# Patient Record
Sex: Male | Born: 1945 | Race: White | Hispanic: No | Marital: Married | Smoking: Current some day smoker
Health system: Southern US, Community
[De-identification: ages and names within clinical notes are randomized; demographics above are authoritative.]

## PROBLEM LIST (undated history)

## (undated) ENCOUNTER — Emergency Department (HOSPITAL_COMMUNITY): Discharge status: Absent without leave | Payer: Medicare Other

## (undated) DIAGNOSIS — K219 Gastro-esophageal reflux disease without esophagitis: Secondary | ICD-10-CM

## (undated) DIAGNOSIS — I10 Essential (primary) hypertension: Secondary | ICD-10-CM

## (undated) DIAGNOSIS — I2510 Atherosclerotic heart disease of native coronary artery without angina pectoris: Secondary | ICD-10-CM

## (undated) DIAGNOSIS — N200 Calculus of kidney: Secondary | ICD-10-CM

## (undated) DIAGNOSIS — J449 Chronic obstructive pulmonary disease, unspecified: Secondary | ICD-10-CM

## (undated) DIAGNOSIS — I2699 Other pulmonary embolism without acute cor pulmonale: Secondary | ICD-10-CM

## (undated) DIAGNOSIS — R339 Retention of urine, unspecified: Secondary | ICD-10-CM

## (undated) DIAGNOSIS — G2581 Restless legs syndrome: Secondary | ICD-10-CM

## (undated) DIAGNOSIS — I255 Ischemic cardiomyopathy: Secondary | ICD-10-CM

## (undated) DIAGNOSIS — F17200 Nicotine dependence, unspecified, uncomplicated: Secondary | ICD-10-CM

## (undated) DIAGNOSIS — Z789 Other specified health status: Secondary | ICD-10-CM

## (undated) DIAGNOSIS — I4891 Unspecified atrial fibrillation: Secondary | ICD-10-CM

## (undated) DIAGNOSIS — R3129 Other microscopic hematuria: Secondary | ICD-10-CM

## (undated) DIAGNOSIS — K5792 Diverticulitis of intestine, part unspecified, without perforation or abscess without bleeding: Secondary | ICD-10-CM

## (undated) DIAGNOSIS — E785 Hyperlipidemia, unspecified: Secondary | ICD-10-CM

## (undated) DIAGNOSIS — F419 Anxiety disorder, unspecified: Secondary | ICD-10-CM

## (undated) DIAGNOSIS — I214 Non-ST elevation (NSTEMI) myocardial infarction: Secondary | ICD-10-CM

## (undated) DIAGNOSIS — C61 Malignant neoplasm of prostate: Secondary | ICD-10-CM

## (undated) DIAGNOSIS — Z9229 Personal history of other drug therapy: Secondary | ICD-10-CM

## (undated) DIAGNOSIS — Z8719 Personal history of other diseases of the digestive system: Secondary | ICD-10-CM

## (undated) HISTORY — DX: Retention of urine, unspecified: R33.9

## (undated) HISTORY — DX: Nicotine dependence, unspecified, uncomplicated: F17.200

## (undated) HISTORY — DX: Diverticulitis of intestine, part unspecified, without perforation or abscess without bleeding: K57.92

## (undated) HISTORY — DX: Other specified health status: Z78.9

## (undated) HISTORY — DX: Non-ST elevation (NSTEMI) myocardial infarction: I21.4

## (undated) HISTORY — DX: Atherosclerotic heart disease of native coronary artery without angina pectoris: I25.10

## (undated) HISTORY — PX: COLONOSCOPY: SHX174

## (undated) HISTORY — DX: Gastro-esophageal reflux disease without esophagitis: K21.9

## (undated) HISTORY — DX: Personal history of other drug therapy: Z92.29

## (undated) HISTORY — DX: Other microscopic hematuria: R31.29

## (undated) HISTORY — DX: Chronic obstructive pulmonary disease, unspecified: J44.9

## (undated) HISTORY — PX: BIV ICD INSERTION CRT-D: EP1195

## (undated) HISTORY — DX: Calculus of kidney: N20.0

## (undated) HISTORY — DX: Unspecified atrial fibrillation: I48.91

## (undated) HISTORY — PX: TRANSTHORACIC ECHOCARDIOGRAM: SHX275

## (undated) HISTORY — PX: INGUINAL HERNIA REPAIR: SUR1180

## (undated) HISTORY — DX: Restless legs syndrome: G25.81

## (undated) HISTORY — DX: Malignant neoplasm of prostate: C61

## (undated) HISTORY — DX: Ischemic cardiomyopathy: I25.5

## (undated) HISTORY — DX: Personal history of other diseases of the digestive system: Z87.19

## (undated) HISTORY — DX: Hyperlipidemia, unspecified: E78.5

## (undated) HISTORY — DX: Other pulmonary embolism without acute cor pulmonale: I26.99

---

## 2002-01-31 HISTORY — PX: LITHOTRIPSY: SUR834

## 2003-05-14 ENCOUNTER — Ambulatory Visit (HOSPITAL_COMMUNITY): Admission: RE | Admit: 2003-05-14 | Discharge: 2003-05-14 | Payer: Self-pay | Admitting: Urology

## 2003-05-16 ENCOUNTER — Ambulatory Visit (HOSPITAL_COMMUNITY): Admission: RE | Admit: 2003-05-16 | Discharge: 2003-05-16 | Payer: Self-pay | Admitting: Urology

## 2006-03-08 ENCOUNTER — Ambulatory Visit: Payer: Self-pay | Admitting: Cardiology

## 2006-03-22 ENCOUNTER — Ambulatory Visit: Payer: Self-pay

## 2006-03-22 ENCOUNTER — Encounter: Payer: Self-pay | Admitting: Cardiology

## 2008-11-29 ENCOUNTER — Emergency Department (HOSPITAL_BASED_OUTPATIENT_CLINIC_OR_DEPARTMENT_OTHER): Admission: EM | Admit: 2008-11-29 | Discharge: 2008-11-29 | Payer: Self-pay | Admitting: Emergency Medicine

## 2008-11-29 ENCOUNTER — Ambulatory Visit: Payer: Self-pay | Admitting: Interventional Radiology

## 2009-02-02 LAB — HM COLONOSCOPY: HM COLON: NORMAL

## 2010-05-06 LAB — CBC
MCHC: 34 g/dL (ref 30.0–36.0)
MCV: 93.7 fL (ref 78.0–100.0)
Platelets: 192 10*3/uL (ref 150–400)
RBC: 5.04 MIL/uL (ref 4.22–5.81)
RDW: 13 % (ref 11.5–15.5)
WBC: 10.5 10*3/uL (ref 4.0–10.5)

## 2010-05-06 LAB — DIFFERENTIAL
Eosinophils Absolute: 0.1 10*3/uL (ref 0.0–0.7)
Lymphocytes Relative: 14 % (ref 12–46)
Lymphs Abs: 1.4 10*3/uL (ref 0.7–4.0)
Neutro Abs: 8.2 10*3/uL — ABNORMAL HIGH (ref 1.7–7.7)
Neutrophils Relative %: 78 % — ABNORMAL HIGH (ref 43–77)

## 2010-05-06 LAB — URINALYSIS, ROUTINE W REFLEX MICROSCOPIC
Bilirubin Urine: NEGATIVE
Glucose, UA: NEGATIVE mg/dL
Hgb urine dipstick: NEGATIVE
Nitrite: NEGATIVE
Protein, ur: NEGATIVE mg/dL
Specific Gravity, Urine: 1.02 (ref 1.005–1.030)
Urobilinogen, UA: 1 mg/dL (ref 0.0–1.0)
pH: 6.5 (ref 5.0–8.0)

## 2010-05-06 LAB — BASIC METABOLIC PANEL
BUN: 13 mg/dL (ref 6–23)
Chloride: 103 mEq/L (ref 96–112)
GFR calc Af Amer: >60 mL/min (ref >60)
Sodium: 140 mEq/L (ref 135–145)

## 2010-06-18 NOTE — H&P (Signed)
NAME:  Daniel [name], Daniel [name]                            ACCOUNT NO.:  [id number]   MEDICAL RECORD NO.:  [id number]                   PATIENT TYPE:  AMB   LOCATION:  DAY                                  FACILITY:  [facility]   PHYSICIAN:  Dennie Maizes, M.D.                DATE OF BIRTH:  07-26-1945   DATE OF ADMISSION:  05/14/2003  DATE OF DISCHARGE:                                HISTORY & PHYSICAL   CHIEF COMPLAINT:  Right lower quadrant abdominal pain, nausea and vomiting.   HISTORY OF PRESENT ILLNESS:  This 65 year old male was referred to me from  the ER at [facility].  He experienced the sudden onset of  severe right lower quadrant abdominal pain radiating to the groin on May 12, 2003.  This was associated with nausea and vomiting.  He went to the  emergency room at [facility].  He was evaluated with a  noncontrast CT of the abdomen and pelvis.  This revealed a large right renal  calculus measuring about 15 mm in size.  There was mild to moderate right  hydronephrosis and dilated ureter.  No definite ureteral calculus was noted.  He was treated with pain medications.  The patient subsequently has passed a  small stone in the urine.  He has relief of right flank pain at the present.  He denied having any fever, chills or voiding difficulty.  There is no past  history of urolithiasis or urinary tract infections.   PAST MEDICAL HISTORY:  1. History of hypertension.  2. Status post bilateral inguinal hernia repair.   MEDICATION:  Hyzaar 25/100 one p.o. daily.   ALLERGIES:  None.   FAMILY HISTORY:  Positive for hypertension and diabetes mellitus.   PHYSICAL EXAMINATION:  HEENT:  Normal.  LUNGS:  Clear to auscultation.  HEART:  Regular rate and rhythm, no murmurs.  ABDOMEN:  Soft, no palpable frank mass.  No costovertebral angle tenderness.  BLADDER:  Not palpable.  PENIS AND TESTES:  Are normal.  RECTAL EXAMINATION:  Benign prostate, 30 gm size.   IMPRESSION:  1. Right renal calculus.  2. Hematuria.  3. Right flank pain.   PLAN:  1. Extracorporeal shock wave lithotripsy of the right renal calculus with IV     sedation in day hospital.  I will discuss with the patient regarding the     diagnosis, operative details, alternate treatments, outcome, possible     risk and complications and he has agreed for the procedure to be done.     __________     ___________________________________________                                         Dennie Maizes, M.D.   SK/MEDQ  D:  05/13/2003  T:  05/13/2003  Job:  [id number]

## 2010-06-18 NOTE — Assessment & Plan Note (Signed)
[facility]                            CARDIOLOGY OFFICE NOTE   TERRACE, FONTANILLA                         MRN:          [id number]  DATE:03/08/2006                            DOB:          20-May-1945    PRIMARY CARE PHYSICIAN:  Dr. Morrie Sheldon.   REASON FOR PRESENTATION:  Evaluate patient with palpitations.   HISTORY OF PRESENT ILLNESS:  Patient is a pleasant 65 year old white  gentleman with no prior cardiac history. He says he has had palpitations  on and off for 20 years. He notices them mostly when he has emotional  stress. He notices them more at night. He does not have them all the  time. However, he presented to Dr. Morrie Sheldon and was noted to have frequent  ventricular ectopy. He had bigeminy on an EKG. He wore an event monitor.  He wore this only for about a week and transmitted only once. He finally  took the patches off because it bothered him. He was not having any  further symptoms. He never has any presyncope or syncope with this. He  describes it as a skipped beat. It may be every other beat for a while.  He does not have any chest discomfort, neck or arm discomfort. He has no  shortness of breath, denies any PND or orthopnea. He works 12 hours a  day but does not exercise.   PAST MEDICAL HISTORY:  Hyperlipidemia recently diagnosed, hypertension  x3 to 4 years (he came off of medicine previously but was restarted a  few days ago).   PAST SURGICAL HISTORY:  Hernia repair x2.   ALLERGIES:  None.   MEDICATIONS:  1. Lovastatin 20 mg daily.  2. Amlodipine/benazepril 5/10 daily.   SOCIAL HISTORY:  Patient is a Naval architect. He is married. He has 2  children. He has been smoking a pack per day for 20 years.   FAMILY HISTORY:  Remarkable for his sister dying after bypass surgery at  age 6.   REVIEW OF SYSTEMS:  As stated in the HPI and positive for reflux.  Negative for all other systems.   PHYSICAL EXAMINATION:  The patient is in no  distress. Blood pressure  170/100, heart rate 80 and regular, weight 190 pounds, body mass index  26.  HEENT: Eyelids unremarkable, pupils equal, round, and react to light,  fundi not visualized, oral mucosa unremarkable.  NECK: No jugular venous distension, wave form within normal limits,  carotid upstroke brisk and symmetric, no bruits, no thyromegaly.  LYMPHATICS:  No cervical, axillary, or inguinal adenopathy.  LUNGS: Clear to auscultation bilaterally.  BACK: No costovertebral angle tenderness.  CHEST: Unremarkable.  HEART: PMI not displaced or sustained, S1 and S2 within normal limits,  no S3, no S4, no clicks, no rubs, no murmurs.  ABDOMEN: Flat, positive bowel sounds, normal in frequency and pitch, no  bruits, rebound, guarding, no midline pulsatile mass, no hepatomegaly,  no splenomegaly.  SKIN: No rashes, no nodules.  EXTREMITIES: 2+ pulses throughout, no edema, no cyanosis, no clubbing.  NEURO: Oriented to person, place, and time, cranial nerves  II-XII  grossly intact, motor grossly intact.   EKG:  Sinus rhythm with ventricular ectopy in a bigeminal pattern,  normal axis, intervals within normal limits, no acute ST-T wave changes.   ASSESSMENT AND PLAN:  1. Palpitations.  The patient has palpitations as described. These are      not particularly symptomatic. I do not strongly suspect structural      heart disease. I am going to get an echocardiogram. We will contact      [facility] to see if he has had any lab work to include a      TSH and a chemistry. Since he is not particularly bothered by this,      he is going to try to avoid caffeine which sometimes brings this      on. At this point I would not suggest primary therapy for this.  2. Multiple cardiovascular risk factors. I am going to bring the      patient back for an exercise treadmill test as he has strong      cardiovascular risk factors.  3. Dyslipidemia, per Dr. Morrie Sheldon.  4. Hypertension.  Blood pressure  is elevated today. However, he was      just started on his amlodipine/benazepril. He is due to see Dr. Morrie Sheldon      in a couple of days and will probably need up titration. I have      encouraged him not to take himself off his medicine again as he      clearly needs blood pressure control.  5. Follow up.  I will see the patient again at the time of his      treadmill test and after his echocardiogram.     Rollene Rotunda, MD, [facility]  Electronically Signed    JH/MedQ  DD: 03/08/2006  DT: 03/08/2006  Job #: [id number]   cc:   Alfredia Client, MD

## 2011-07-19 ENCOUNTER — Encounter (HOSPITAL_BASED_OUTPATIENT_CLINIC_OR_DEPARTMENT_OTHER): Payer: Self-pay | Admitting: *Deleted

## 2011-07-19 ENCOUNTER — Emergency Department (HOSPITAL_BASED_OUTPATIENT_CLINIC_OR_DEPARTMENT_OTHER): Payer: BC Managed Care – PPO

## 2011-07-19 ENCOUNTER — Emergency Department (HOSPITAL_BASED_OUTPATIENT_CLINIC_OR_DEPARTMENT_OTHER)
Admission: EM | Admit: 2011-07-19 | Discharge: 2011-07-19 | Disposition: A | Discharge status: Home / Self Care | Payer: BC Managed Care – PPO | Attending: Emergency Medicine | Admitting: Emergency Medicine

## 2011-07-19 DIAGNOSIS — F411 Generalized anxiety disorder: Secondary | ICD-10-CM | POA: Insufficient documentation

## 2011-07-19 DIAGNOSIS — F17200 Nicotine dependence, unspecified, uncomplicated: Secondary | ICD-10-CM | POA: Insufficient documentation

## 2011-07-19 DIAGNOSIS — I10 Essential (primary) hypertension: Secondary | ICD-10-CM | POA: Insufficient documentation

## 2011-07-19 DIAGNOSIS — R079 Chest pain, unspecified: Secondary | ICD-10-CM | POA: Insufficient documentation

## 2011-07-19 DIAGNOSIS — I4891 Unspecified atrial fibrillation: Secondary | ICD-10-CM | POA: Insufficient documentation

## 2011-07-19 DIAGNOSIS — R9431 Abnormal electrocardiogram [ECG] [EKG]: Secondary | ICD-10-CM | POA: Insufficient documentation

## 2011-07-19 HISTORY — DX: Essential (primary) hypertension: I10

## 2011-07-19 HISTORY — DX: Anxiety disorder, unspecified: F41.9

## 2011-07-19 LAB — BASIC METABOLIC PANEL
BUN: 19 mg/dL (ref 6–23)
CO2: 25 mEq/L (ref 19–32)
Chloride: 106 mEq/L (ref 96–112)
GFR calc Af Amer: >90 mL/min (ref >90)
GFR calc non Af Amer: 87 mL/min — ABNORMAL LOW (ref >90)
Glucose, Bld: 157 mg/dL — ABNORMAL HIGH (ref 70–99)
Sodium: 140 mEq/L (ref 135–145)

## 2011-07-19 LAB — DIFFERENTIAL
Eosinophils Relative: 2 % (ref 0–5)
Monocytes Absolute: 0.7 10*3/uL (ref 0.1–1.0)
Monocytes Relative: 9 % (ref 3–12)
Neutro Abs: 5.2 10*3/uL (ref 1.7–7.7)
Neutrophils Relative %: 65 % (ref 43–77)

## 2011-07-19 LAB — CBC
HCT: 47.3 % (ref 39.0–52.0)
Hemoglobin: 16.9 g/dL (ref 13.0–17.0)
MCV: 89.2 fL (ref 78.0–100.0)
RBC: 5.3 MIL/uL (ref 4.22–5.81)

## 2011-07-19 MED ORDER — DILTIAZEM HCL 100 MG IV SOLR: 5.0000 mg/h | INTRAVENOUS | Status: DC

## 2011-07-19 MED ORDER — DILTIAZEM LOAD VIA INFUSION
15.0000 mg | Freq: Once | INTRAVENOUS | Status: DC
  Filled 2011-07-19: qty 15

## 2011-07-19 MED ORDER — DILTIAZEM HCL ER COATED BEADS 120 MG PO CP24: 120.0000 mg | ORAL_CAPSULE | Freq: Every day | ORAL | Status: DC

## 2011-07-19 NOTE — ED Notes (Signed)
Pt refuses w/c for transport to room, amb to room 6 with quick steady gait in nad. Pt reports sudden onset of "feeling like my heart is beating funny" x this am, denies any sob, nausea, diaphoresis, or any other c/o. Pt states he called his insurance help line and was told to come to ed right away. Pt states he had same sx about 4 years ago, had extensive workup at [facility], and was prescribed xanax "for my heart". ekg and iv access obtained while pt being triaged.

## 2011-07-19 NOTE — Discharge Instructions (Signed)
Atrial Fibrillation Your caregiver has diagnosed you with atrial fibrillation (AFib). The heart normally beats very regularly; AFib is a type of irregular heartbeat. The heart rate may be faster or slower than normal. This can prevent your heart from pumping as well as it should. AFib can be constant (chronic) or intermittent (paroxysmal). CAUSES  Atrial fibrillation may be caused by:  Heart disease, including heart attack, coronary artery disease, heart failure, diseases of the heart valves, and others.   Blood clot in the lungs (pulmonary embolism).   Pneumonia or other infections.   Chronic lung disease.   Thyroid disease.   Toxins. These include alcohol, some medications (such as decongestant medications or diet pills), and caffeine.  In some people, no cause for AFib can be found. This is referred to as Lone Atrial Fibrillation. SYMPTOMS   Palpitations or a fluttering in your chest.   A vague sense of chest discomfort.   Shortness of breath.   Sudden onset of lightheadedness or weakness.  Sometimes, the first sign of AFib can be a complication of the condition. This could be a stroke or heart failure. DIAGNOSIS  Your description of your condition may make your caregiver suspicious of atrial fibrillation. Your caregiver will examine your pulse to determine if fibrillation is present. An EKG (electrocardiogram) will confirm the diagnosis. Further testing may help determine what caused you to have atrial fibrillation. This may include chest x-ray, echocardiogram, blood tests, or CT scans. PREVENTION  If you have previously had atrial fibrillation, your caregiver may advise you to avoid substances known to cause the condition (such as stimulant medications, and possibly caffeine or alcohol). You may be advised to use medications to prevent recurrence. Proper treatment of any underlying condition is important to help prevent recurrence. PROGNOSIS  Atrial fibrillation does tend to  become a chronic condition over time. It can cause significant complications (see below). Atrial fibrillation is not usually immediately life-threatening, but it can shorten your life expectancy. This seems to be worse in women. If you have lone atrial fibrillation and are under 60 years old, the risk of complications is very low, and life expectancy is not shortened. RISKS AND COMPLICATIONS  Complications of atrial fibrillation can include stroke, chest pain, and heart failure. Your caregiver will recommend treatments for the atrial fibrillation, as well as for any underlying conditions, to help minimize risk of complications. TREATMENT  Treatment for AFib is divided into several categories:  Treatment of any underlying condition.   Converting you out of AFib into a regular (sinus) rhythm.   Controlling rapid heart rate.   Prevention of blood clots and stroke.  Medications and procedures are available to convert your atrial fibrillation to sinus rhythm. However, recent studies have shown that this may not offer you any advantage, and cardiac experts are continuing research and debate on this topic. More important is controlling your rapid heartbeat. The rapid heartbeat causes more symptoms, and places strain on your heart. Your caregiver will advise you on the use of medications that can control your heart rate. Atrial fibrillation is a strong stroke risk. You can lessen this risk by taking blood thinning medications such as Coumadin (warfarin), or sometimes aspirin. These medications need close monitoring by your caregiver. Over-medication can cause bleeding. Too little medication may not protect against stroke. HOME CARE INSTRUCTIONS   If your caregiver prescribed medicine to make your heartbeat more normally, take as directed.   If blood thinners were prescribed by your caregiver, take EXACTLY as directed.     Perform blood tests EXACTLY as directed.   Quit smoking. Smoking increases your  cardiac and lung (pulmonary) risks.   DO NOT drink alcohol.   DO NOT drink caffeinated drinks (e.g. coffee, soda, chocolate, and leaf teas). You may drink decaffeinated coffee, soda or tea.   If you are overweight, you should choose a reduced calorie diet to lose weight. Please see a registered dietitian if you need more information about healthy weight loss. DO NOT USE DIET PILLS as they may aggravate heart problems.   If you have other heart problems that are causing AFib, you may need to eat a low salt, fat, and cholesterol diet. Your caregiver will tell you if this is necessary.   Exercise every day to improve your physical fitness. Stay active unless advised otherwise.   If your caregiver has given you a follow-up appointment, it is very important to keep that appointment. Not keeping the appointment could result in heart failure or stroke. If there is any problem keeping the appointment, you must call back to this facility for assistance.  SEEK MEDICAL CARE IF:  You notice a change in the rate, rhythm or strength of your heartbeat.   You develop an infection or any other change in your overall health status.  SEEK IMMEDIATE MEDICAL CARE IF:   You develop chest pain, abdominal pain, sweating, weakness or feel sick to your stomach (nausea).   You develop shortness of breath.   You develop swollen feet and ankles.   You develop dizziness, numbness, or weakness of your face or limbs, or any change in vision or speech.  MAKE SURE YOU:   Understand these instructions.   Will watch your condition.   Will get help right away if you are not doing well or get worse.  Document Released: 01/17/2005 Document Revised: 01/06/2011 Document Reviewed: 08/22/2007 [employer] Patient Information 2012 [employer]. 

## 2011-07-19 NOTE — ED Notes (Signed)
Via [facility] -- spoke with Thomas 

## 2011-07-19 NOTE — ED Provider Notes (Signed)
History     CSN: [id number]  Arrival date & time 07/19/11  0700   First MD Initiated Contact with Patient 07/19/11 [phone]      Chief Complaint  Patient presents with  . Chest Pain     HPI Pt refuses w/c for transport to room, amb to room 6 with quick steady gait in nad. Pt reports sudden onset of "feeling like my heart is beating funny" x this am, denies any sob, nausea, diaphoresis, or any other c/o. Pt states he called his insurance help line and was told to come to ed right away. Pt states he had same sx about 4 years ago, had extensive workup at [facility], and was prescribed xanax "for my heart". ekg and iv access obtained while pt being triaged.       Past Medical History  Diagnosis Date  . Hypertension   . Anxiety     History reviewed. No pertinent past surgical history.  History reviewed. No pertinent family history.  History  Substance Use Topics  . Smoking status: Current Everyday Smoker  . Smokeless tobacco: Not on file  . Alcohol Use:       Review of Systems  All other systems reviewed and are negative.    Allergies  Review of patient's allergies indicates no known allergies.  Home Medications   Current Outpatient Rx  Name Route Sig Dispense Refill  . ALPRAZOLAM 0.25 MG PO TABS Oral Take 0.25 mg by mouth at bedtime as needed.    [name] BENAZEPRIL HCL 10 MG PO TABS Oral Take 10 mg by mouth daily.    [name] DOXAZOSIN MESYLATE 1 MG PO TABS Oral Take 1 mg by mouth at bedtime.      BP 157/111  Pulse 113  Temp 98.5 F (36.9 C) (Oral)  Resp 18  SpO2 99%  Physical Exam  Nursing note and vitals reviewed. Constitutional: He is oriented to person, place, and time. He appears well-developed and well-nourished. No distress.  HENT:  Head: Normocephalic and atraumatic.  Eyes: Pupils are equal, round, and reactive to light.  Neck: Normal range of motion.  Cardiovascular: Intact distal pulses.  Tachycardia present.        Atrial fibrillation with rapid ventricular  response Rate = 111 Nonspecific ST-T changes QRS = 108 Abnormal EKG  Pulmonary/Chest: No respiratory distress.  Abdominal: Normal appearance. He exhibits no distension.  Musculoskeletal: Normal range of motion.  Neurological: He is alert and oriented to person, place, and time. No cranial nerve deficit.  Skin: Skin is warm and dry. No rash noted.  Psychiatric: He has a normal mood and affect. His behavior is normal.    ED Course  Procedures (including critical care time) CRITICAL CARE Performed by: Nelva Nay L   Total critical care time: 30 min  Critical care time was exclusive of separately billable procedures and treating other patients.  Critical care was necessary to treat or prevent imminent or life-threatening deterioration.  Critical care was time spent personally by me on the following activities: development of treatment plan with patient and/or surrogate as well as nursing, discussions with consultants, evaluation of patient's response to treatment, examination of patient, obtaining history from patient or surrogate, ordering and performing treatments and interventions, ordering and review of laboratory studies, ordering and review of radiographic studies, pulse oximetry and re-evaluation of patient's condition.    Prior to administration of Cardizem patient converted to a normal sinus rhythm with ventricular rate of 62.  Patient feels back to normal.  Plan at this time is to evaluate laboratory and x-ray if all are unremarkable then we'll refer to cardiology. Labs Reviewed  BASIC METABOLIC PANEL - Abnormal; Notable for the following:    Glucose, Bld 157 (*)     GFR calc non Af Amer 87 (*)     All other components within normal limits  TROPONIN I  CBC  DIFFERENTIAL   Dg Chest 2 View  07/19/2011  *RADIOLOGY REPORT*  Clinical Data: Chest pain  CHEST - 2 VIEW  Comparison: None.  Findings: Hyperinflation with flattening of the hemidiaphragms.  No focal consolidation.   No pleural effusion or pneumothorax. Cardiomediastinal contours within normal limits.  No acute osseous finding.  IMPRESSION: Hyperinflation without focal consolidation.  Original Report Authenticated By: Waneta Martins, M.D.     1. Atrial fibrillation       MDM  Discussed the case with cardiology.  Recommended discharging on long-acting diltiazem a.m.  Will arrange close followup with discussion of possible anticoagulation.        Nelia Shi, MD 07/19/11 [phone]

## 2011-07-21 ENCOUNTER — Ambulatory Visit (INDEPENDENT_AMBULATORY_CARE_PROVIDER_SITE_OTHER): Payer: BC Managed Care – PPO | Admitting: Cardiology

## 2011-07-21 ENCOUNTER — Encounter: Payer: Self-pay | Admitting: Cardiology

## 2011-07-21 VITALS — BP 142/78 | HR 65 | Ht 71.0 in | Wt 177.0 lb

## 2011-07-21 DIAGNOSIS — F419 Anxiety disorder, unspecified: Secondary | ICD-10-CM | POA: Insufficient documentation

## 2011-07-21 DIAGNOSIS — I9789 Other postprocedural complications and disorders of the circulatory system, not elsewhere classified: Secondary | ICD-10-CM | POA: Insufficient documentation

## 2011-07-21 DIAGNOSIS — F411 Generalized anxiety disorder: Secondary | ICD-10-CM

## 2011-07-21 DIAGNOSIS — IMO0002 Reserved for concepts with insufficient information to code with codable children: Secondary | ICD-10-CM | POA: Insufficient documentation

## 2011-07-21 DIAGNOSIS — R9430 Abnormal result of cardiovascular function study, unspecified: Secondary | ICD-10-CM | POA: Insufficient documentation

## 2011-07-21 DIAGNOSIS — I4891 Unspecified atrial fibrillation: Secondary | ICD-10-CM

## 2011-07-21 DIAGNOSIS — I10 Essential (primary) hypertension: Secondary | ICD-10-CM

## 2011-07-21 DIAGNOSIS — R0989 Other specified symptoms and signs involving the circulatory and respiratory systems: Secondary | ICD-10-CM

## 2011-07-21 MED ORDER — BENAZEPRIL HCL 20 MG PO TABS: 20.0000 mg | ORAL_TABLET | Freq: Every day | ORAL | Status: DC

## 2011-07-21 NOTE — Assessment & Plan Note (Signed)
The patient had a prolonged episode of atrial fib and finally broke spontaneously. He thinks that diltiazem increases his lightheadedness. He has resting bradycardia without a beta blocker. He's had only one episode of the atrial fibrillation that was prolonged. We know that he has normal LV function in the past and he had a normal stress nuclear scan in the past. I've chosen not to repeat these at this time. The CHADS score is 1. I've chosen not to anticoagulate him. I will see him back for followup. If he has another episode: Encourage him to take a dose of his diltiazem. It is not the optimal preparation because it is a long-acting drug. However I feel a will be adequate.

## 2011-07-21 NOTE — Assessment & Plan Note (Addendum)
Blood pressure today is 142/78. He brought in some blood pressure readings from recent days showing that his pressure is somewhat higher. He explained to me that when his primary physician tried another medication that his blood pressure dropped too much. This current pressure represents him being on an as until 10, diltiazem 120, and Cardura 1 mg. He thinks that the diltiazem is making his injury or problems worse with some slight increase in lightheadedness. I've chosen to suggest that he increase his Lotensin from 10 mg 20 mg daily. We will stop the diltiazem. I've considered adding a beta blocker but his resting heart rate he is 52. Verapamil could be considered. I do not know if you have any other affect different from the diltiazem. His recent episode as he only prolonged episode that he has had. I've chosen to only change his Lotensin and follow him.

## 2011-07-21 NOTE — Assessment & Plan Note (Signed)
In this note I dictated the comments about atrial fib under the heading of ejection fraction.  In talking further with the patient he does admit to some increased caffeine intake. He'll be more careful with this.

## 2011-07-21 NOTE — Patient Instructions (Addendum)
Your physician recommends that you schedule a follow-up appointment in: 6-8 weeks  Your physician has recommended you make the following change in your medication: Stop your Diltiazem (Cardizem) but take it if you have a prolonged episode of palpitations.   Increase your Lotensin (Benazepril) to 20mg  daily

## 2011-07-21 NOTE — Progress Notes (Signed)
   HPI   Patient is seen for cardiac evaluation for atrial fibrillation. He had been seen in our office in 2008. He had palpitations. The prior report says that he wore an event recorder but that there was only one transmission during a week. No significant arrhythmia was documented. He had an echocardiogram revealing an ejection fraction of 65%. There was a nuclear scan. I cannot find the report. He says it was normal. He feels palpitations from time to time. He is very active. He does not have chest pain. Recently he had an episode of prolonged palpitations and was seen in the emergency room. He had rapid atrial fibrillation. And actually converted as he came into the emergency room. He was placed on oral diltiazem. He has been taking this and has not had any significant recurrence. However he thinks that this medication makes his injury or problems worse and causes some mild lightheadedness.He is stable today and not having any significant problems.  No Known Allergies  Current Outpatient Prescriptions  Medication Sig Dispense Refill  . ALPRAZolam (XANAX) 0.25 MG tablet Take 0.25 mg by mouth at bedtime as needed.      . benazepril (LOTENSIN) 10 MG tablet Take 10 mg by mouth daily.      [name] diltiazem (CARDIZEM CD) 120 MG 24 hr capsule Take 1 capsule (120 mg total) by mouth daily.  30 capsule  0  . doxazosin (CARDURA) 1 MG tablet Take 1 mg by mouth at bedtime.        History   Social History  . Marital Status: Married    Spouse Name: N/A    Number of Children: N/A  . Years of Education: N/A   Occupational History  . Not on file.   Social History Main Topics  . Smoking status: Current Everyday Smoker  . Smokeless tobacco: Not on file  . Alcohol Use:   . Drug Use:   . Sexually Active:    Other Topics Concern  . Not on file   Social History Narrative  . No narrative on file    No family history on file.  Past Medical History  Diagnosis Date  . Hypertension   . Anxiety   .  Atrial fibrillation     Limited episode, emergency room, June, 2013, spontaneous conversion to sinus rhythm  . Ejection fraction     EF 65%, echo, 2008    No past surgical history on file.  ROS   Patient denies fever, chills, headache, sweats, rash, change in vision, change in hearing, chest pain, cough, nausea vomiting, urinary symptoms. All other systems are reviewed and are negative.  PHYSICAL EXAM  Patient is oriented to person time and place. Affect is normal. There is no jugulovenous distention. Lungs are clear. Respiratory effort is nonlabored. Cardiac exam reveals S1-S2. There no clicks or significant murmurs. Abdomen is soft. There is no peripheral edema. There no musculoskeletal deformities. There are no skin rashes.  Filed Vitals:   07/21/11 1352  BP: 142/78  Pulse: 65  Height: 5\' 11"  (1.803 m)  Weight: 177 lb (80.287 kg)   EKG is done today and reviewed by me. There is sinus bradycardia with a rate of 52.There are no other significant changes.  ASSESSMENT & PLAN

## 2011-07-25 NOTE — Addendum Note (Signed)
Addended by: Reine Just on: 07/25/2011 12:36 PM   Modules accepted: Orders

## 2011-09-26 ENCOUNTER — Ambulatory Visit: Payer: BC Managed Care – PPO | Admitting: Cardiology

## 2012-07-31 HISTORY — PX: OTHER SURGICAL HISTORY: SHX169

## 2012-08-16 ENCOUNTER — Encounter: Payer: Self-pay | Admitting: Family Medicine

## 2012-08-16 ENCOUNTER — Ambulatory Visit (INDEPENDENT_AMBULATORY_CARE_PROVIDER_SITE_OTHER): Payer: Medicare Other | Admitting: Family Medicine

## 2012-08-16 VITALS — BP 185/98 | HR 60 | Temp 97.3°F | Resp 16 | Ht 69.5 in | Wt 175.0 lb

## 2012-08-16 DIAGNOSIS — I10 Essential (primary) hypertension: Secondary | ICD-10-CM

## 2012-08-16 DIAGNOSIS — H9319 Tinnitus, unspecified ear: Secondary | ICD-10-CM

## 2012-08-16 DIAGNOSIS — Z8679 Personal history of other diseases of the circulatory system: Secondary | ICD-10-CM

## 2012-08-16 DIAGNOSIS — R972 Elevated prostate specific antigen [PSA]: Secondary | ICD-10-CM

## 2012-08-16 DIAGNOSIS — Z7189 Other specified counseling: Secondary | ICD-10-CM

## 2012-08-16 DIAGNOSIS — K219 Gastro-esophageal reflux disease without esophagitis: Secondary | ICD-10-CM

## 2012-08-16 DIAGNOSIS — N400 Enlarged prostate without lower urinary tract symptoms: Secondary | ICD-10-CM

## 2012-08-16 DIAGNOSIS — Z7689 Persons encountering health services in other specified circumstances: Secondary | ICD-10-CM

## 2012-08-16 DIAGNOSIS — N139 Obstructive and reflux uropathy, unspecified: Secondary | ICD-10-CM

## 2012-08-16 DIAGNOSIS — H9313 Tinnitus, bilateral: Secondary | ICD-10-CM

## 2012-08-16 DIAGNOSIS — Z0000 Encounter for general adult medical examination without abnormal findings: Secondary | ICD-10-CM

## 2012-08-16 DIAGNOSIS — N401 Enlarged prostate with lower urinary tract symptoms: Secondary | ICD-10-CM

## 2012-08-16 LAB — CBC WITH DIFFERENTIAL/PLATELET
Basophils Relative: 0.4 % (ref 0.0–3.0)
Eosinophils Absolute: 0.1 10*3/uL (ref 0.0–0.7)
Hemoglobin: 15.3 g/dL (ref 13.0–17.0)
Lymphocytes Relative: 25.9 % (ref 12.0–46.0)
MCHC: 33.8 g/dL (ref 30.0–36.0)
MCV: 93.9 fl (ref 78.0–100.0)
Neutro Abs: 4.2 10*3/uL (ref 1.4–7.7)
RBC: 4.82 Mil/uL (ref 4.22–5.81)

## 2012-08-16 LAB — COMPREHENSIVE METABOLIC PANEL
ALT: 14 U/L (ref 0–53)
AST: 19 U/L (ref 0–37)
Alkaline Phosphatase: 61 U/L (ref 39–117)
CO2: 29 mEq/L (ref 19–32)
GFR: 95.47 mL/min (ref >60.00)
Sodium: 138 mEq/L (ref 135–145)
Total Bilirubin: 0.5 mg/dL (ref 0.3–1.2)
Total Protein: 6.6 g/dL (ref 6.0–8.3)

## 2012-08-16 LAB — LIPID PANEL
HDL: 33.3 mg/dL — ABNORMAL LOW (ref >39.00)
Total CHOL/HDL Ratio: 5

## 2012-08-16 MED ORDER — DOXAZOSIN MESYLATE 2 MG PO TABS: 2.0000 mg | ORAL_TABLET | Freq: Every day | ORAL | Status: DC

## 2012-08-16 NOTE — Progress Notes (Signed)
Office Note 08/30/2012  CC:  Chief Complaint  Patient presents with  . Establish Care  . Tinnitus    HPI:  Daniel [name]. is a 67 y.o. White male who is here to establish care, has ringing in his ear. Patient's most recent primary MD: Kathryne Sharper FP Silas [place] FNP) Old records in EPIC/HL EMR were reviewed prior to or during today's visit. Last tetanus was < 5 yrs ago.  Bp monitoring at home usually 140-150/70-80.   Sounds like he takes HCTZ 1/2 tab of 25mg  every other day on avg b/c bp goes down to 120/60 at times if he takes it daily and he feels sluggish when this happens.  Reports 3 mo of "cricket-like" ringing in both ears and hx of decreased hearing on lower tones bilaterally. +Hx of noise exposure at work.  Hx of BPH with hx of elevated PSA, last PSA 10/2011.  He says he still has some poor stream and urinary frequency, incomplete emptying.  No dysuria.  Past Medical History  Diagnosis Date  . Hypertension   . Anxiety   . Atrial fibrillation     Limited episode, emergency room, June, 2013, spontaneous conversion to sinus rhythm  . Ejection fraction     EF 65%, echo, 2008  . Nephrolithiasis   . 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 (Dr. Rito Ehrlich retired then Dr. Edwyna Shell retired)--latest PSA 12.34 --Dr. Edwyna Shell wanted to see him again if his PSA got over 14.  Repeat PSA in 60mo and if rising then will send to urology.  . Diverticulitis   . GERD (gastroesophageal reflux disease)     Past Surgical History  Procedure Laterality Date  . Lithotripsy  2004    Dr. Rito Ehrlich in [place].  . Inguinal hernia repair  2003 and 2004    Both sides.    Family History  Problem Relation Age of Onset  . Hypertension Mother   . Cancer Father     Lung ca age 25  . Diabetes Sister   . Cancer - Other Mother 74    breast    History   Social History  . Marital Status: Married    Spouse  Name: N/A    Number of Children: N/A  . Years of Education: N/A   Occupational History  . Not on file.   Social History Main Topics  . Smoking status: Current Every Day Smoker    Types: Cigarettes  . Smokeless tobacco: Never Used  . Alcohol Use: No  . Drug Use: No  . Sexually Active: Not on file   Other Topics Concern  . Not on file   Social History Narrative   Married, 2 grown children.   HS education.  Orig from [place], lives there now.   Occupation: maintenance.   Tobacco: 20 pack-yr hx (current as of 07/2012)   No drugs.   Alcohol: none    Outpatient Encounter Prescriptions as of 08/16/2012  Medication Sig Dispense Refill  . ALPRAZolam (XANAX) 0.25 MG tablet Take 0.25 mg by mouth at bedtime as needed.      . benazepril (LOTENSIN) 20 MG tablet Take 1 tablet (20 mg total) by mouth daily.  30 tablet  11  . Cholecalciferol (VITAMIN D-3 PO) Take by mouth daily.      . [DISCONTINUED] doxazosin (CARDURA) 1 MG tablet Take 1 mg by mouth at bedtime.      [name]  doxazosin (CARDURA) 2 MG tablet Take 1 tablet (2 mg total) by mouth at bedtime.  30 tablet  6   No facility-administered encounter medications on file as of 08/16/2012.    No Known Allergies  ROS Review of Systems  Constitutional: Negative for fever and fatigue.  HENT: Negative for congestion and sore throat.   Eyes: Negative for visual disturbance.  Respiratory: Negative for cough.   Cardiovascular: Negative for chest pain.  Gastrointestinal: Negative for nausea and abdominal pain.  Genitourinary: Negative for dysuria.  Musculoskeletal: Negative for back pain and joint swelling.  Skin: Negative for rash.  Neurological: Negative for weakness and headaches.  Hematological: Negative for adenopathy.     PE; Blood pressure 185/98, pulse 60, temperature 97.3 F (36.3 C), temperature source Oral, resp. rate 16, height 5' 9.5" (1.765 m), weight 175 lb (79.379 kg), SpO2 98.00%. Gen: Alert, well appearing.  Patient is  oriented to person, place, time, and situation. ENT: Ears: EACs clear, normal epithelium.  TMs with good light reflex and landmarks bilaterally.  Eyes: no injection, icteris, swelling, or exudate.  EOMI, PERRLA. Nose: no drainage or turbinate edema/swelling.  No injection or focal lesion.  Mouth: lips without lesion/swelling.  Oral mucosa pink and moist.  Dentition intact and without obvious caries or gingival swelling.  Oropharynx without erythema, exudate, or swelling.  CV: RRR, no m/r/g.   LUNGS: CTA bilat, nonlabored resps, good aeration in all lung fields. EXT: no clubbing, cyanosis, or edema.   Pertinent labs:  None today  ASSESSMENT AND PLAN:   BPH (benign prostatic hypertrophy) with urinary obstruction And hx of elevated PSA.  Need old records. Will increase his doxazosin to 2mg  today.  GERD (gastroesophageal reflux disease) At the end of the visit today he asked for a med for his reflux: prilosec OTC 20mg  qd rx'd.  HTN (hypertension), benign Well controlled per home measurements. Continue with HCTZ 12.5 mg qd and benazepril 20mg  qd.  Encounter to establish care with new doctor Problems reviewed.  Need old records. Will obtain fasting HP + PSA and will have him return for CPE at his earliest convenience.  Tinnitus of both ears Likely associated with some hearing loss from hx of noise exposure. No further e/m at this time.   An After Visit Summary was printed and given to the patient.  Return for return at [employer] for CPE (NOT FASTING).

## 2012-08-21 ENCOUNTER — Encounter: Payer: Self-pay | Admitting: Family Medicine

## 2012-08-21 ENCOUNTER — Telehealth: Payer: Self-pay | Admitting: Family Medicine

## 2012-08-21 NOTE — Telephone Encounter (Signed)
Pls notify pt: I reviewed recent labs, which were all normal except his PSA was 12.3.   In review of his urologist's old records, he wanted to see him back in his office if his PSA got over 14.  I want to repeat his PSA in 6 mo and if it is rising (even if it isn't over 14), then I will refer him to a new urologist (pt told me Dr. Edwyna Shell retired recently).  Pls set up lab appt. In 6 mo to do PSA total and % free PSA---dx is elevated PSA level--thx

## 2012-08-21 NOTE — Telephone Encounter (Signed)
Patient aware of results.  He states he will talk to Dr Milinda Cave more about this at his CPE next week.

## 2012-08-30 ENCOUNTER — Encounter: Payer: Self-pay | Admitting: Family Medicine

## 2012-08-30 ENCOUNTER — Ambulatory Visit (INDEPENDENT_AMBULATORY_CARE_PROVIDER_SITE_OTHER): Payer: Medicare Other | Admitting: Family Medicine

## 2012-08-30 VITALS — BP 126/77 | HR 75 | Temp 99.1°F | Resp 16 | Ht 69.5 in | Wt 175.0 lb

## 2012-08-30 DIAGNOSIS — N138 Other obstructive and reflux uropathy: Secondary | ICD-10-CM

## 2012-08-30 DIAGNOSIS — Z7689 Persons encountering health services in other specified circumstances: Secondary | ICD-10-CM | POA: Insufficient documentation

## 2012-08-30 DIAGNOSIS — N401 Enlarged prostate with lower urinary tract symptoms: Secondary | ICD-10-CM | POA: Insufficient documentation

## 2012-08-30 DIAGNOSIS — N139 Obstructive and reflux uropathy, unspecified: Secondary | ICD-10-CM

## 2012-08-30 DIAGNOSIS — K219 Gastro-esophageal reflux disease without esophagitis: Secondary | ICD-10-CM | POA: Insufficient documentation

## 2012-08-30 DIAGNOSIS — R0989 Other specified symptoms and signs involving the circulatory and respiratory systems: Secondary | ICD-10-CM | POA: Insufficient documentation

## 2012-08-30 DIAGNOSIS — Z0000 Encounter for general adult medical examination without abnormal findings: Secondary | ICD-10-CM | POA: Insufficient documentation

## 2012-08-30 DIAGNOSIS — I10 Essential (primary) hypertension: Secondary | ICD-10-CM

## 2012-08-30 DIAGNOSIS — H9313 Tinnitus, bilateral: Secondary | ICD-10-CM | POA: Insufficient documentation

## 2012-08-30 MED ORDER — HYDROCHLOROTHIAZIDE 12.5 MG PO CAPS: 12.5000 mg | ORAL_CAPSULE | Freq: Every day | ORAL | Status: DC

## 2012-08-30 NOTE — Assessment & Plan Note (Signed)
Improved with increase of doxazosin to a whole 2mg  tab daily.

## 2012-08-30 NOTE — Assessment & Plan Note (Signed)
Problems reviewed.  Need old records. Will obtain fasting HP + PSA and will have him return for CPE at his earliest convenience.

## 2012-08-30 NOTE — Assessment & Plan Note (Signed)
Likely associated with some hearing loss from hx of noise exposure. No further e/m at this time.

## 2012-08-30 NOTE — Assessment & Plan Note (Signed)
Improved with daily prilosec 20mg .

## 2012-08-30 NOTE — Assessment & Plan Note (Signed)
At the end of the visit today he asked for a med for his reflux: prilosec OTC 20mg  qd rx'd.

## 2012-08-30 NOTE — Assessment & Plan Note (Signed)
Check u/s today to screen for AAA.

## 2012-08-30 NOTE — Progress Notes (Signed)
Office Note 08/30/2012  CC:  Chief Complaint  Patient presents with  . Annual Exam    HPI:  Daniel [name]. is a 67 y.o. White male who is here for CPE. We reviewed all recent labs--HP fine, DRE around 12--he says this is c/w with past measurements (anywhere from 8 to 15 per his report).  Last urologist he had was Dr. Edwyna Shell, whom he says has retired.  Pt says all prostate exams in the past have been normal.  See PMH section. Says he has no new complaints. BPH sx's (nocturia) improved on 2mg  dose of doxazosin. Says the prilosec 20mg  I rx'd last visit helps well.   Past Medical History  Diagnosis Date  . Hypertension   . Anxiety   . Atrial fibrillation     Limited episode, emergency room, June, 2013, spontaneous conversion to sinus rhythm  . Ejection fraction     EF 65%, echo, 2008  . Nephrolithiasis   . 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 (Dr. Rito Ehrlich retired then Dr. Edwyna Shell retired)--latest PSA 12.34 --Dr. Edwyna Shell wanted to see him again if his PSA got over 14.  Repeat PSA in 60mo and if rising then will send to urology.  . Diverticulitis   . GERD (gastroesophageal reflux disease)   . Tobacco dependence   . BPH (benign prostatic hypertrophy) with urinary obstruction     Past Surgical History  Procedure Laterality Date  . Lithotripsy  2004    Dr. Rito Ehrlich in [place].  . Inguinal hernia repair  2003 and 2004    Both sides.    Family History  Problem Relation Age of Onset  . Hypertension Mother   . Cancer Father     Lung ca age 64  . Diabetes Sister   . Cancer - Other Mother 93    breast    History   Social History  . Marital Status: Married    Spouse Name: N/A    Number of Children: N/A  . Years of Education: N/A   Occupational History  . Not on file.   Social History Main Topics  . Smoking status: Current Every Day Smoker    Types: Cigarettes  . Smokeless  tobacco: Never Used  . Alcohol Use: No  . Drug Use: No  . Sexually Active: Not on file   Other Topics Concern  . Not on file   Social History Narrative   Married, 2 grown children.   HS education.  Orig from [place], lives there now.   Occupation: maintenance.   Tobacco: 20 pack-yr hx (current as of 07/2012)   No drugs.   Alcohol: none    Outpatient Prescriptions Prior to Visit  Medication Sig Dispense Refill  . ALPRAZolam (XANAX) 0.25 MG tablet Take 0.25 mg by mouth at bedtime as needed.      . benazepril (LOTENSIN) 20 MG tablet Take 1 tablet (20 mg total) by mouth daily.  30 tablet  11  . Cholecalciferol (VITAMIN D-3 PO) Take by mouth daily.      [name] doxazosin (CARDURA) 2 MG tablet Take 1 tablet (2 mg total) by mouth at bedtime.  30 tablet  6   No facility-administered medications prior to visit.  HCTZ 12.5 mg po qd  No Known Allergies  ROS Review of Systems  Constitutional: Negative for fever, chills, appetite change and fatigue.  HENT: Negative for ear  pain, congestion, sore throat, neck stiffness and dental problem.   Eyes: Negative for discharge, redness and visual disturbance.  Respiratory: Negative for cough, chest tightness, shortness of breath and wheezing.   Cardiovascular: Negative for chest pain, palpitations and leg swelling.  Gastrointestinal: Negative for nausea, vomiting, abdominal pain, diarrhea and blood in stool.  Endocrine: Negative for cold intolerance, heat intolerance, polydipsia, polyphagia and polyuria.  Genitourinary: Negative for dysuria, urgency, frequency, hematuria, flank pain, difficulty urinating and testicular pain.  Musculoskeletal: Negative for myalgias, back pain, joint swelling and arthralgias.  Skin: Negative for pallor and rash.  Neurological: Negative for dizziness, speech difficulty, weakness and headaches.  Hematological: Negative for adenopathy. Does not bruise/bleed easily.  Psychiatric/Behavioral: Negative for confusion, sleep  disturbance and dysphoric mood. The patient is not nervous/anxious.   No LE pain with walking.  PE; Blood pressure 126/77, pulse 75, temperature 99.1 F (37.3 C), temperature source Temporal, resp. rate 16, height 5' 9.5" (1.765 m), weight 175 lb (79.379 kg), SpO2 98.00%. Gen: Alert, well appearing.  Patient is oriented to person, place, time, and situation. AFFECT: pleasant, lucid thought and speech. ENT: Ears: EACs clear, normal epithelium.  TMs with good light reflex and landmarks bilaterally.  Eyes: no injection, icteris, swelling, or exudate.  EOMI, PERRLA. Nose: no drainage or turbinate edema/swelling.  No injection or focal lesion.  Mouth: lips without lesion/swelling.  Oral mucosa pink and moist.  Upper and lower dentures in place. Oropharynx without erythema, exudate, or swelling.  Neck: supple/nontender.  No LAD, mass, or TM.  Carotid pulses 2+ bilaterally, without bruits. CV: RRR, no m/r/g.   LUNGS: CTA bilat, nonlabored resps, good aeration in all lung fields. ABD: soft, NT, ND, BS normal.  No hepatospenomegaly.  His abd aortic pulsation is easily palpable just superior to the umbillicus  No abd bruit.  Femoral pulses 2+ bilat, with trace bruit on left side, no bruit on right.   Popliteal arteries and DP arteries 2+ bilat. EXT: no clubbing, cyanosis, or edema.  Musculoskeletal: no joint swelling, erythema, warmth, or tenderness.  ROM of all joints intact. Skin - no sores or suspicious lesions or rashes or color changes   Pertinent labs:  Lab Results  Component Value Date   TSH 0.96 08/16/2012   Lab Results  Component Value Date   WBC 6.4 08/16/2012   HGB 15.3 08/16/2012   HCT 45.3 08/16/2012   MCV 93.9 08/16/2012   PLT 178.0 08/16/2012   Lab Results  Component Value Date   CREATININE 0.9 08/16/2012   BUN 18 08/16/2012   NA 138 08/16/2012   K 4.5 08/16/2012   CL 103 08/16/2012   CO2 29 08/16/2012   Lab Results  Component Value Date   ALT 14 08/16/2012   AST 19 08/16/2012    ALKPHOS 61 08/16/2012   BILITOT 0.5 08/16/2012   Lab Results  Component Value Date   CHOL 158 08/16/2012   Lab Results  Component Value Date   HDL 33.30* 08/16/2012   Lab Results  Component Value Date   LDLCALC 113* 08/16/2012   Lab Results  Component Value Date   TRIG 61.0 08/16/2012   Lab Results  Component Value Date   CHOLHDL 5 08/16/2012   Lab Results  Component Value Date   PSA 12.34* 08/16/2012       ASSESSMENT AND PLAN:   Health maintenance examination Reviewed age and gender appropriate health maintenance issues (prudent diet, regular exercise, health risks of tobacco and excessive alcohol, use of seatbelts,  fire alarms in home, use of sunscreen).  Also reviewed age and gender appropriate health screening as well as vaccine recommendations. Pt UTD with vaccines and colon cancer screening. DRE normal today, PSA c/w past numbers.  Still awaiting old urology records.   REcent HP labs good/reviewed.   HTN (hypertension), benign Stable. RF'd HCTZ 12.5mg  qd. Continue all current bp meds.  GERD (gastroesophageal reflux disease) Improved with daily prilosec 20mg .  BPH (benign prostatic hypertrophy) with urinary obstruction Improved with increase of doxazosin to a whole 2mg  tab daily.  Palpable abdominal aorta Check u/s today to screen for AAA.  An After Visit Summary was printed and given to the patient.  FOLLOW UP:  Return in about 6 months (around 03/02/2013) for f/u HTN, BPH.

## 2012-08-30 NOTE — Assessment & Plan Note (Signed)
Reviewed age and gender appropriate health maintenance issues (prudent diet, regular exercise, health risks of tobacco and excessive alcohol, use of seatbelts, fire alarms in home, use of sunscreen).  Also reviewed age and gender appropriate health screening as well as vaccine recommendations. Pt UTD with vaccines and colon cancer screening. DRE normal today, PSA c/w past numbers.  Still awaiting old urology records.   REcent HP labs good/reviewed.

## 2012-08-30 NOTE — Assessment & Plan Note (Signed)
And hx of elevated PSA.  Need old records. Will increase his doxazosin to 2mg  today.

## 2012-08-30 NOTE — Assessment & Plan Note (Signed)
Stable. RF'd HCTZ 12.5mg  qd. Continue all current bp meds.

## 2012-08-30 NOTE — Assessment & Plan Note (Signed)
Well controlled per home measurements. Continue with HCTZ 12.5 mg qd and benazepril 20mg  qd.

## 2012-09-03 ENCOUNTER — Encounter: Payer: Self-pay | Admitting: Family Medicine

## 2012-09-03 ENCOUNTER — Ambulatory Visit (HOSPITAL_BASED_OUTPATIENT_CLINIC_OR_DEPARTMENT_OTHER)
Admission: RE | Admit: 2012-09-03 | Discharge: 2012-09-03 | Disposition: A | Discharge status: Home / Self Care | Payer: Medicare Other | Source: Ambulatory Visit | Attending: Family Medicine | Admitting: Family Medicine

## 2012-09-03 DIAGNOSIS — R0989 Other specified symptoms and signs involving the circulatory and respiratory systems: Secondary | ICD-10-CM

## 2012-09-03 DIAGNOSIS — I700 Atherosclerosis of aorta: Secondary | ICD-10-CM | POA: Insufficient documentation

## 2012-09-24 ENCOUNTER — Telehealth: Payer: Self-pay | Admitting: Emergency Medicine

## 2012-09-24 MED ORDER — BENAZEPRIL HCL 20 MG PO TABS: 20.0000 mg | ORAL_TABLET | Freq: Every day | ORAL | Status: DC

## 2012-09-24 NOTE — Telephone Encounter (Signed)
Patient needs a refill on benazepril 20mg  and thought it was faxed Friday but the pharmacy told him it was not received from [place]. Please advise. [facility] pharmacy

## 2013-03-01 ENCOUNTER — Ambulatory Visit (INDEPENDENT_AMBULATORY_CARE_PROVIDER_SITE_OTHER): Payer: Medicare Other | Admitting: Family Medicine

## 2013-03-01 ENCOUNTER — Encounter: Payer: Self-pay | Admitting: Family Medicine

## 2013-03-01 VITALS — BP 173/106 | HR 64 | Temp 98.4°F | Resp 18 | Ht 69.5 in | Wt 175.0 lb

## 2013-03-01 DIAGNOSIS — N401 Enlarged prostate with lower urinary tract symptoms: Secondary | ICD-10-CM

## 2013-03-01 DIAGNOSIS — R972 Elevated prostate specific antigen [PSA]: Secondary | ICD-10-CM | POA: Insufficient documentation

## 2013-03-01 DIAGNOSIS — F17200 Nicotine dependence, unspecified, uncomplicated: Secondary | ICD-10-CM | POA: Insufficient documentation

## 2013-03-01 DIAGNOSIS — Z125 Encounter for screening for malignant neoplasm of prostate: Secondary | ICD-10-CM

## 2013-03-01 DIAGNOSIS — N138 Other obstructive and reflux uropathy: Secondary | ICD-10-CM

## 2013-03-01 DIAGNOSIS — I10 Essential (primary) hypertension: Secondary | ICD-10-CM | POA: Insufficient documentation

## 2013-03-01 DIAGNOSIS — N139 Obstructive and reflux uropathy, unspecified: Secondary | ICD-10-CM

## 2013-03-01 LAB — PSA, MEDICARE: PSA: 11.98 ng/mL — AB (ref 0.10–4.00)

## 2013-03-01 MED ORDER — AMLODIPINE BESYLATE 10 MG PO TABS: 10.0000 mg | ORAL_TABLET | Freq: Every day | ORAL | Status: DC

## 2013-03-01 NOTE — Progress Notes (Signed)
OFFICE NOTE  03/01/2013  CC:  Chief Complaint  Patient presents with  . Follow-up     HPI: Patient is a 68 y.o. Caucasian male who is here for 6 mo f/u HTN, BPH.130 Stress at job a lot, trying to decide whether or not to retire. Home bp measurements show 170/90s avg.  Denies CP, palpitations, or heart racing. Occ bp 130s/60s and he associates this with taking his HCTZ (??).    Still some occ urinary hesitancy symptoms but nothing persistent.  Nocturia only 1X per night.   Doxaz making his heart beat a little faster.  Still smoking about a pack a day.  He is trying to cut back but does not want to go "cold [place]".  Pertinent PMH:  Past Medical History  Diagnosis Date  . Hypertension   . Anxiety   . Atrial fibrillation     Limited episode, emergency room, June, 2013, spontaneous conversion to sinus rhythm  . Ejection fraction     EF 65%, echo, 2008  . Nephrolithiasis   . 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 (Dr. Maryland Pink retired then Dr. Elnoria Howard retired)--latest PSA 12.34 on 08/16/12 --Dr. Elnoria Howard wanted to see him again if his PSA got over 14.  Repeat PSA in 22mo and if rising then will send to urology.  . Diverticulitis   . GERD (gastroesophageal reflux disease)   . Tobacco dependence   . BPH (benign prostatic hypertrophy) with urinary obstruction     Nocturia is his primary symptom  . Has received pneumococcal vaccination    Past surgical, social, and family history reviewed and no changes noted since last office visit.  MEDS:  Outpatient Prescriptions Prior to Visit  Medication Sig Dispense Refill  . ALPRAZolam (XANAX) 0.25 MG tablet Take 0.25 mg by mouth at bedtime as needed.      . benazepril (LOTENSIN) 20 MG tablet Take 1 tablet (20 mg total) by mouth daily.  30 tablet  6  . Cholecalciferol (VITAMIN D-3 PO) Take by mouth daily.      [name] doxazosin (CARDURA) 2 MG tablet Take 1  tablet (2 mg total) by mouth at bedtime.  30 tablet  6  . fish oil-omega-3 fatty acids 1000 MG capsule Take 2 g by mouth daily.      . hydrochlorothiazide (MICROZIDE) 12.5 MG capsule Take 1 capsule (12.5 mg total) by mouth daily.  90 capsule  1   No facility-administered medications prior to visit.    PE: Blood pressure 173/106, pulse 64, temperature 98.4 F (36.9 C), temperature source Temporal, resp. rate 18, height 5' 9.5" (1.765 m), weight 175 lb (79.379 kg), SpO2 99.00%.  Repeat BP 150/86 in each arm with manual bp cuff after pt in room after resting 55m. Gen: Alert, well appearing.  Patient is oriented to person, place, time, and situation. CV: RRR, no m/r/g.   LUNGS: CTA bilat, nonlabored resps, good aeration in all lung fields.   IMPRESSION AND PLAN:  Uncontrolled hypertension He is afraid of his 12.5mg  HCTZ---he associates it with bp drop, even though the number it drops to is normal (he says he feels "give out" when it happens). I'll d/c this med in light of this info, plus with his BPH it is not the best idea to have him urinating any more than he has to. Will do trial of amlodipine 10mg  qAM and have him  take his benazepril 20mg  qhs with his cardura.   Continue home bp monitoring. Recheck in 2 wks.  Elevated PSA Due for repeat today. See PMH section for details.  Tobacco dependence Smoking cessation instruction/counseling given:  Encouraged him to continue cutting back since he is not entertaining the idea of complete cessation at this time.    He declined pneumovax today.  An After Visit Summary was printed and given to the patient.  FOLLOW UP: 2 wks, f/u HTN

## 2013-03-01 NOTE — Assessment & Plan Note (Signed)
He is afraid of his 12.5mg  HCTZ---he associates it with bp drop, even though the number it drops to is normal (he says he feels "give out" when it happens). I'll d/c this med in light of this info, plus with his BPH it is not the best idea to have him urinating any more than he has to. Will do trial of amlodipine 10mg  qAM and have him take his benazepril 20mg  qhs with his cardura.   Continue home bp monitoring. Recheck in 2 wks.

## 2013-03-01 NOTE — Assessment & Plan Note (Signed)
Smoking cessation instruction/counseling given:  Encouraged him to continue cutting back since he is not entertaining the idea of complete cessation at this time.

## 2013-03-01 NOTE — Assessment & Plan Note (Signed)
Due for repeat today. See PMH section for details.

## 2013-03-01 NOTE — Progress Notes (Signed)
Pre visit review using our clinic review tool, if applicable. No additional management support is needed unless otherwise documented below in the visit note. 

## 2013-03-04 ENCOUNTER — Encounter: Payer: Self-pay | Admitting: Family Medicine

## 2013-03-05 ENCOUNTER — Telehealth: Payer: Self-pay | Admitting: Family Medicine

## 2013-03-05 NOTE — Telephone Encounter (Signed)
Relevant patient education mailed to patient.  

## 2013-03-21 ENCOUNTER — Ambulatory Visit: Payer: Medicare Other | Admitting: Family Medicine

## 2013-04-01 ENCOUNTER — Ambulatory Visit (INDEPENDENT_AMBULATORY_CARE_PROVIDER_SITE_OTHER): Payer: Medicare Other | Admitting: Family Medicine

## 2013-04-01 ENCOUNTER — Encounter: Payer: Self-pay | Admitting: Family Medicine

## 2013-04-01 VITALS — BP 142/73 | HR 69 | Temp 98.7°F | Resp 18 | Ht 69.5 in | Wt 177.0 lb

## 2013-04-01 DIAGNOSIS — I10 Essential (primary) hypertension: Secondary | ICD-10-CM

## 2013-04-01 MED ORDER — METOPROLOL SUCCINATE ER 25 MG PO TB24: 25.0000 mg | ORAL_TABLET | Freq: Every day | ORAL | Status: DC

## 2013-04-01 NOTE — Progress Notes (Signed)
Pre visit review using our clinic review tool, if applicable. No additional management support is needed unless otherwise documented below in the visit note. 

## 2013-04-01 NOTE — Progress Notes (Signed)
OFFICE NOTE  04/01/2013  CC:  Chief Complaint  Patient presents with  . Hypertension     HPI: Patient is a 68 y.o. Caucasian male who is here for 2 wk f/u HTN.   Feels like he is having bilat lower leg swelling since starting the amlodipine. Some mild arthralgias lately, some rapid heartbeat at night sometimes. BP still stage 1 systolic most of the time, diastolics normal.  HR in the 60s-70s.    Pertinent PMH:  Past medical, surgical, social, and family history reviewed and no changes are noted since last office visit. +Tobacco smoker-current.  MEDS:  Outpatient Prescriptions Prior to Visit  Medication Sig Dispense Refill  . ALPRAZolam (XANAX) 0.25 MG tablet Take 0.25 mg by mouth at bedtime as needed.      [name] amLODipine (NORVASC) 10 MG tablet Take 1 tablet (10 mg total) by mouth daily.  30 tablet  1  . benazepril (LOTENSIN) 20 MG tablet Take 1 tablet (20 mg total) by mouth daily.  30 tablet  6  . Cholecalciferol (VITAMIN D-3 PO) Take by mouth daily.      [name] doxazosin (CARDURA) 2 MG tablet Take 1 tablet (2 mg total) by mouth at bedtime.  30 tablet  6  . fish oil-omega-3 fatty acids 1000 MG capsule Take 2 g by mouth daily.       No facility-administered medications prior to visit.    PE: Blood pressure 142/73, pulse 69, temperature 98.7 F (37.1 C), temperature source Temporal, resp. rate 18, height 5' 9.5" (1.765 m), weight 177 lb (80.287 kg), SpO2 100.00%. Gen: Alert, well appearing.  Patient is oriented to person, place, time, and situation. CV: RRR (rate 70), no m/r/g Chest is clear, no wheezing or rales. Normal symmetric air entry throughout both lung fields. No chest wall deformities or tenderness. EXT: no clubbing, cyanosis, or edema.   IMPRESSION AND PLAN:  1) HTN, side effects from amlodipine. Will do trial of toprol XL 25mg  po qd.  D/c amlodipine. Continue 20mg  benazepril qd. Continue home bp monitoring. F/u 2 wks. Once bp is consistently controlled, will get him  on ASA 81mg  qd.  An After Visit Summary was printed and given to the patient.

## 2013-04-02 ENCOUNTER — Telehealth: Payer: Self-pay

## 2013-04-02 ENCOUNTER — Telehealth: Payer: Self-pay | Admitting: Family Medicine

## 2013-04-02 NOTE — Telephone Encounter (Signed)
Relevant patient education mailed to patient.  

## 2013-04-02 NOTE — Telephone Encounter (Signed)
Yes, this med will slow HR. As long as he is asymptomatic, no worries. Make sure he has f/u appt. W/Dr McGowen.

## 2013-04-02 NOTE — Telephone Encounter (Signed)
Pt called and stated he was put on Toprol for his blood pressure. His blood pressure is 139/57 and his pulse is 46. He is wondering if his pulse is ok. He usually has a pulse of 69. I advised him I would let him know as soon as possible. I believe this is a side effect to the medication but wanted to make sure. Layne can you help with this? Dr Anitra Lauth is gone for today. Thanks

## 2013-04-03 ENCOUNTER — Other Ambulatory Visit: Payer: Self-pay | Admitting: Family Medicine

## 2013-04-03 MED ORDER — DOXAZOSIN MESYLATE 2 MG PO TABS: 2.0000 mg | ORAL_TABLET | Freq: Every day | ORAL | Status: DC

## 2013-04-03 NOTE — Telephone Encounter (Signed)
Spoke with pt, he is not symptomatic and he does have a follow up appt with Dr Anitra Lauth. I advised pt to come in if he starts to have symptoms with this. Pt understood.

## 2013-04-15 ENCOUNTER — Encounter: Payer: Self-pay | Admitting: Family Medicine

## 2013-04-15 ENCOUNTER — Ambulatory Visit (INDEPENDENT_AMBULATORY_CARE_PROVIDER_SITE_OTHER): Payer: Medicare Other | Admitting: Family Medicine

## 2013-04-15 VITALS — BP 175/90 | HR 59 | Temp 98.6°F | Resp 18 | Ht 69.5 in | Wt 177.0 lb

## 2013-04-15 DIAGNOSIS — I10 Essential (primary) hypertension: Secondary | ICD-10-CM

## 2013-04-15 MED ORDER — METOPROLOL SUCCINATE ER 25 MG PO TB24: ORAL_TABLET | ORAL | Status: DC

## 2013-04-15 MED ORDER — ASPIRIN EC 81 MG PO TBEC: 81.0000 mg | DELAYED_RELEASE_TABLET | Freq: Every day | ORAL | Status: DC

## 2013-04-15 NOTE — Progress Notes (Signed)
OFFICE NOTE  04/15/2013  CC:  Chief Complaint  Patient presents with  . Follow-up  . Hypertension     HPI: Patient is a 68 y.o. Caucasian male who is here for 2 wk f/u HTN. Last visit we d/c'd amlodipine (swelling) and started toprol XL. Says bp normalized pretty well but HR came down to the 40s and he felt sluggish. He then started cutting the toprol in half and has been feeling fine HR in 50s.  BPs 130-140s/80s.  LE swelling/cramping/soreness is much improved off of amlodipine.  He has cut back to 1/2 pack a day of cigs since i saw him 2 wks ago.  Pertinent PMH:  Past medical, surgical, social, and family history reviewed and no changes are noted since last office visit.  MEDS:  Outpatient Prescriptions Prior to Visit  Medication Sig Dispense Refill  . ALPRAZolam (XANAX) 0.25 MG tablet Take 0.25 mg by mouth at bedtime as needed.      . benazepril (LOTENSIN) 20 MG tablet Take 1 tablet (20 mg total) by mouth daily.  30 tablet  6  . Cholecalciferol (VITAMIN D-3 PO) Take by mouth daily.      [name] doxazosin (CARDURA) 2 MG tablet Take 1 tablet (2 mg total) by mouth at bedtime.  30 tablet  3  . fish oil-omega-3 fatty acids 1000 MG capsule Take 2 g by mouth daily.      . metoprolol succinate (TOPROL-XL) 25 MG 24 hr tablet Take 1 tablet (25 mg total) by mouth daily.  30 tablet  1   No facility-administered medications prior to visit.    PE: Blood pressure 175/90, pulse 59, temperature 98.6 F (37 C), temperature source Temporal, resp. rate 18, height 5' 9.5" (1.765 m), weight 177 lb (80.287 kg), SpO2 99.00%. Gen: Alert, well appearing.  Patient is oriented to person, place, time, and situation. CV: RRR, no m/r/g Chest is clear, no wheezing or rales. Normal symmetric air entry throughout both lung fields. No chest wall deformities or tenderness. EXT: no clubbing, cyanosis, or edema.    IMPRESSION AND PLAN:  HTN, well controlled on current regimen of 1/2 toprol XL 25mg  tab qd and  benazepril 40mg  qd. Continue to cut back on smoking. May restart 81mg  aspirin.  An After Visit Summary was printed and given to the patient.  FOLLOW UP: 3 mo HTN f/u

## 2013-04-15 NOTE — Progress Notes (Signed)
Pre visit review using our clinic review tool, if applicable. No additional management support is needed unless otherwise documented below in the visit note. 

## 2013-04-16 ENCOUNTER — Telehealth: Payer: Self-pay | Admitting: Family Medicine

## 2013-04-16 NOTE — Telephone Encounter (Signed)
Relevant patient education mailed to patient.  

## 2013-06-19 ENCOUNTER — Ambulatory Visit (INDEPENDENT_AMBULATORY_CARE_PROVIDER_SITE_OTHER): Payer: Medicare Other | Admitting: Family Medicine

## 2013-06-19 ENCOUNTER — Encounter: Payer: Self-pay | Admitting: Family Medicine

## 2013-06-19 VITALS — BP 177/94 | HR 55 | Temp 98.2°F | Resp 16 | Ht 69.5 in | Wt 179.0 lb

## 2013-06-19 DIAGNOSIS — R300 Dysuria: Secondary | ICD-10-CM

## 2013-06-19 DIAGNOSIS — I10 Essential (primary) hypertension: Secondary | ICD-10-CM

## 2013-06-19 DIAGNOSIS — N410 Acute prostatitis: Secondary | ICD-10-CM

## 2013-06-19 HISTORY — DX: Acute prostatitis: N41.0

## 2013-06-19 LAB — POCT URINALYSIS DIPSTICK
Glucose, UA: NEGATIVE
LEUKOCYTES UA: NEGATIVE
Nitrite, UA: NEGATIVE
PH UA: 5
PROTEIN UA: NEGATIVE
Spec Grav, UA: >=1.03
Urobilinogen, UA: 0.2

## 2013-06-19 MED ORDER — CIPROFLOXACIN HCL 500 MG PO TABS: 500.0000 mg | ORAL_TABLET | Freq: Two times a day (BID) | ORAL | Status: DC

## 2013-06-19 MED ORDER — BENAZEPRIL HCL 40 MG PO TABS: 40.0000 mg | ORAL_TABLET | Freq: Every day | ORAL | Status: DC

## 2013-06-19 NOTE — Patient Instructions (Signed)
Take your doxazosin 2mg  tab twice a day for 3 days

## 2013-06-19 NOTE — Progress Notes (Signed)
Pre visit review using our clinic review tool, if applicable. No additional management support is needed unless otherwise documented below in the visit note. 

## 2013-06-19 NOTE — Progress Notes (Signed)
OFFICE NOTE  06/19/2013  CC:  Chief Complaint  Patient presents with  . Abdominal Pain  . Dysuria     HPI: Patient is a 69 y.o. Caucasian male who is here for pain with urination. Onset 4-5 d/a, initially with urinary hesitancy and some straining/incomplete 2 emptying, plus painful urination.  Then progressed to discomfort over bladder area.  No gross hematuria.  No fever, nausea, or flank pain.  Had intercourse 2 d prior to onset of sx's. Riding lawnmower more than usual lately.  Home BPs consistently 638V systolic lately.  No HA, no CP, no palpitations, no SOB. Compliant with bp med.   Pertinent PMH:  Past medical, surgical, social, and family history reviewed and no changes are noted since last office visit.  MEDS:  Outpatient Prescriptions Prior to Visit  Medication Sig Dispense Refill  . ALPRAZolam (XANAX) 0.25 MG tablet Take 0.25 mg by mouth at bedtime as needed.      [name] aspirin EC 81 MG tablet Take 1 tablet (81 mg total) by mouth daily.  30 tablet  0  . benazepril (LOTENSIN) 20 MG tablet Take 1 tablet (20 mg total) by mouth daily.  30 tablet  6  . Cholecalciferol (VITAMIN D-3 PO) Take by mouth daily.      [name] doxazosin (CARDURA) 2 MG tablet Take 1 tablet (2 mg total) by mouth at bedtime.  30 tablet  3  . fish oil-omega-3 fatty acids 1000 MG capsule Take 2 g by mouth daily.      . metoprolol succinate (TOPROL-XL) 25 MG 24 hr tablet 1/2 tab po qd  30 tablet  1   No facility-administered medications prior to visit.    PE: Blood pressure 177/94, pulse 55, temperature 98.2 F (36.8 C), temperature source Temporal, resp. rate 16, height 5' 9.5" (1.765 m), weight 179 lb (81.194 kg), SpO2 99.00%. Gen: Alert, well appearing.  Patient is oriented to person, place, time, and situation. CV: RRR, no m/r/g LUNGS: CTA bilat, nonlabored resps ABD: diffuse lower abd TTP over bladder region.  No guarding or rebound tenderness.  NO mass, no HSM, no bruit.  No distention.  BS normal. EXT:  no clubbing, cyanosis, or edema.  DRE: prostate diffusely enlarged and diffusely tender to palpation.  No nodule, no fluctuance.    LAB: CC UA showed trace intact blood, trace ket, SG >1.030  IMPRESSION AND PLAN:  1) Acute prostatitis with some urinary retention/obst sx's: Cipro 500 mg bid x 14d.  Take an extra cardura each day for the next 2d.   Send urine for c/s.  2) HTN, not well controlled. Increase lotensin to 40mg  qd.  An After Visit Summary was printed and given to the patient.   FOLLOW UP: already has f/u appt scheduled.

## 2013-06-20 LAB — URINE CULTURE
Colony Count: NO GROWTH
Organism ID, Bacteria: NO GROWTH

## 2013-07-03 ENCOUNTER — Other Ambulatory Visit: Payer: Self-pay | Admitting: Family Medicine

## 2013-07-03 MED ORDER — DOXAZOSIN MESYLATE 2 MG PO TABS: 2.0000 mg | ORAL_TABLET | Freq: Every day | ORAL | Status: DC

## 2013-07-16 ENCOUNTER — Ambulatory Visit: Payer: Medicare Other | Admitting: Family Medicine

## 2013-07-31 ENCOUNTER — Telehealth: Payer: Self-pay | Admitting: Family Medicine

## 2013-07-31 MED ORDER — ALPRAZOLAM 0.25 MG PO TABS: 0.2500 mg | ORAL_TABLET | Freq: Every evening | ORAL | Status: DC | PRN

## 2013-07-31 NOTE — Telephone Encounter (Signed)
Refill request for alprazolam # 90.  I don't see where pt got this since 2013.  Last OV was 06/01/13.  Please advise.

## 2013-07-31 NOTE — Telephone Encounter (Signed)
Alprazolam rx printed. 

## 2013-08-09 ENCOUNTER — Ambulatory Visit (INDEPENDENT_AMBULATORY_CARE_PROVIDER_SITE_OTHER): Payer: Medicare Other | Admitting: Family Medicine

## 2013-08-09 ENCOUNTER — Encounter: Payer: Self-pay | Admitting: Family Medicine

## 2013-08-09 VITALS — BP 205/95 | HR 51 | Temp 97.7°F | Resp 18 | Ht 69.5 in | Wt 177.0 lb

## 2013-08-09 DIAGNOSIS — I10 Essential (primary) hypertension: Secondary | ICD-10-CM

## 2013-08-09 DIAGNOSIS — F411 Generalized anxiety disorder: Secondary | ICD-10-CM

## 2013-08-09 DIAGNOSIS — R972 Elevated prostate specific antigen [PSA]: Secondary | ICD-10-CM

## 2013-08-09 DIAGNOSIS — G4700 Insomnia, unspecified: Secondary | ICD-10-CM

## 2013-08-09 DIAGNOSIS — F17200 Nicotine dependence, unspecified, uncomplicated: Secondary | ICD-10-CM

## 2013-08-09 DIAGNOSIS — N411 Chronic prostatitis: Secondary | ICD-10-CM

## 2013-08-09 LAB — COMPREHENSIVE METABOLIC PANEL
ALK PHOS: 76 U/L (ref 39–117)
ALT: 17 U/L (ref 0–53)
AST: 23 U/L (ref 0–37)
Albumin: 4.1 g/dL (ref 3.5–5.2)
BILIRUBIN TOTAL: 0.8 mg/dL (ref 0.2–1.2)
BUN: 14 mg/dL (ref 6–23)
CO2: 26 mEq/L (ref 19–32)
Calcium: 9.8 mg/dL (ref 8.4–10.5)
Chloride: 101 mEq/L (ref 96–112)
Creatinine, Ser: 0.9 mg/dL (ref 0.4–1.5)
GFR: 90.27 mL/min (ref >60.00)
GLUCOSE: 86 mg/dL (ref 70–99)
POTASSIUM: 4.3 meq/L (ref 3.5–5.1)
Sodium: 135 mEq/L (ref 135–145)
Total Protein: 6.7 g/dL (ref 6.0–8.3)

## 2013-08-09 LAB — LIPID PANEL
CHOL/HDL RATIO: 5
CHOLESTEROL: 159 mg/dL (ref 0–200)
HDL: 35.3 mg/dL — ABNORMAL LOW (ref >39.00)
LDL Cholesterol: 109 mg/dL — ABNORMAL HIGH (ref 0–99)
NonHDL: 123.7
TRIGLYCERIDES: 72 mg/dL (ref 0.0–149.0)
VLDL: 14.4 mg/dL (ref 0.0–40.0)

## 2013-08-09 LAB — PSA, MEDICARE: PSA: 13.82 ng/mL — AB (ref 0.10–4.00)

## 2013-08-09 MED ORDER — ALPRAZOLAM 0.25 MG PO TABS: 0.2500 mg | ORAL_TABLET | Freq: Every evening | ORAL | Status: DC | PRN

## 2013-08-09 MED ORDER — HYDROCHLOROTHIAZIDE 25 MG PO TABS: 25.0000 mg | ORAL_TABLET | Freq: Every day | ORAL | Status: DC

## 2013-08-09 NOTE — Progress Notes (Signed)
Pre visit review using our clinic review tool, if applicable. No additional management support is needed unless otherwise documented below in the visit note. 

## 2013-08-09 NOTE — Progress Notes (Signed)
OFFICE NOTE  08/09/2013  CC:  Chief Complaint  Patient presents with  . Follow-up    3 months     HPI: Patient is a 68 y.o. Caucasian male who is here for 3 mo f/u HTN and tobacco dependence. Stressed at work, still smoking "occasionally", out of xanax--easily frustrated lately and he thinks this is contributing to poor BP control.  Sites some bps 160-170/70s, HR though is down into 50s and he feels weak with this HR. He changed his toprol to qod and then to 1/2 qod and it still makes HR too low.  Denies any current sx's of prostatitis.  Due for repeat PSA today.  Denies polyuria or nocturia.  ROS: no blurry vision, no dizziness, no CP, no SOB  Pertinent PMH:  Past medical, surgical, social, and family history reviewed and no changes are noted since last office visit.  MEDS: Not on cipro below Outpatient Prescriptions Prior to Visit  Medication Sig Dispense Refill  . aspirin EC 81 MG tablet Take 1 tablet (81 mg total) by mouth daily.  30 tablet  0  . benazepril (LOTENSIN) 40 MG tablet Take 1 tablet (40 mg total) by mouth daily.  30 tablet  1  . Cholecalciferol (VITAMIN D-3 PO) Take by mouth daily.      [name] doxazosin (CARDURA) 2 MG tablet Take 1 tablet (2 mg total) by mouth at bedtime.  30 tablet  3  . fish oil-omega-3 fatty acids 1000 MG capsule Take 2 g by mouth daily.      . metoprolol succinate (TOPROL-XL) 25 MG 24 hr tablet 1/2 tab po qd  30 tablet  1  . ALPRAZolam (XANAX) 0.25 MG tablet Take 1 tablet (0.25 mg total) by mouth at bedtime as needed.  90 tablet  0  . ciprofloxacin (CIPRO) 500 MG tablet Take 1 tablet (500 mg total) by mouth 2 (two) times daily.  28 tablet  0   No facility-administered medications prior to visit.    PE: Blood pressure 205/95, pulse 51, temperature 97.7 F (36.5 C), temperature source Temporal, resp. rate 18, height 5' 9.5" (1.765 m), weight 177 lb (80.287 kg), SpO2 99.00%. Gen: Alert, well appearing.  Patient is oriented to person, place, time,  and situation. OIB:BCWU: no injection, icteris, swelling, or exudate.  EOMI, PERRLA. Mouth: lips without lesion/swelling.  Oral mucosa pink and moist. Oropharynx without erythema, exudate, or swelling.  CV: RRR, no m/r/g.   LUNGS: CTA bilat, nonlabored resps, good aeration in all lung fields. EXT: no clubbing, cyanosis, or edema.    IMPRESSION AND PLAN:  1) Uncontrolled HTN, with symptomatic bradycardia on toprol lately. Otherwise is feeling good other than being stressed out by work. D/c metoprolol.  Start HCTZ 25mg  qd and continue lotensin 40mg  qd. Hold ASA for now until bp under better control. CMET today. Fasting lipid panel today.  2) Tob dep: encouraged complete cessation.  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  4) Anxiety/insomnia: he takes xanax low dose very judiciously.  RF'd this today.  5) Prev health: he declined prevnar 13 IM today.  An After Visit Summary was printed and given to the patient.  FOLLOW UP: 2 wks--monitor bp and HR and bring these to appt

## 2013-08-13 ENCOUNTER — Encounter: Payer: Self-pay | Admitting: Family Medicine

## 2013-08-14 ENCOUNTER — Telehealth: Payer: Self-pay | Admitting: Family Medicine

## 2013-08-14 NOTE — Telephone Encounter (Signed)
Relevant patient education mailed to patient.  

## 2013-08-27 ENCOUNTER — Encounter: Payer: Self-pay | Admitting: Family Medicine

## 2013-08-27 ENCOUNTER — Ambulatory Visit (INDEPENDENT_AMBULATORY_CARE_PROVIDER_SITE_OTHER): Payer: Medicare Other | Admitting: Family Medicine

## 2013-08-27 VITALS — BP 180/77 | HR 65 | Temp 97.7°F | Resp 18 | Ht 69.5 in | Wt 177.0 lb

## 2013-08-27 DIAGNOSIS — I10 Essential (primary) hypertension: Secondary | ICD-10-CM

## 2013-08-27 DIAGNOSIS — F411 Generalized anxiety disorder: Secondary | ICD-10-CM

## 2013-08-27 LAB — BASIC METABOLIC PANEL
BUN: 23 mg/dL (ref 6–23)
CHLORIDE: 106 meq/L (ref 96–112)
CO2: 31 meq/L (ref 19–32)
CREATININE: 0.8 mg/dL (ref 0.4–1.5)
Calcium: 9.7 mg/dL (ref 8.4–10.5)
GFR: 97.83 mL/min (ref >60.00)
Glucose, Bld: 117 mg/dL — ABNORMAL HIGH (ref 70–99)
Potassium: 4.1 mEq/L (ref 3.5–5.1)
Sodium: 139 mEq/L (ref 135–145)

## 2013-08-27 MED ORDER — CITALOPRAM HYDROBROMIDE 20 MG PO TABS: 20.0000 mg | ORAL_TABLET | Freq: Every day | ORAL | Status: DC

## 2013-08-27 NOTE — Progress Notes (Signed)
Pre visit review using our clinic review tool, if applicable. No additional management support is needed unless otherwise documented below in the visit note. 

## 2013-08-27 NOTE — Progress Notes (Signed)
OFFICE NOTE  08/27/2013  CC:  Chief Complaint  Patient presents with  . Follow-up    feels weak after taking medication   HPI: Patient is a 68 y.o. Caucasian male who is here for 3 wk f/u uncontrolled HTN. D/c'd beta blocker last visit and started HCTZ 25mg  qd. Home bp's since last visit avg 150 syst, 07W diastolic, HR 80-88 range. Despite no low bp's or bradycardia he still feels daily fatigue/washed out, mild dizziness after climbing a flight of stairs. He is trying to stay well hydrated.  No CP, no SOB. No arm or jaw symptoms.  No cough.  This is not made any worse by activity.  "Feels like a hunger weakness". He then reveals that his job is overwhelming him and these episodes only come on days he works--feels well on weekend. He is overloaded with responsibility, put too much on him, get him feeling too aggravated and stressed.  Denies depressed/sad mood, just aggravated easily.  Pertinent PMH:  Past medical, surgical, social, and family history reviewed and no changes are noted since last office visit.  MEDS:  Outpatient Prescriptions Prior to Visit  Medication Sig Dispense Refill  . ALPRAZolam (XANAX) 0.25 MG tablet Take 1 tablet (0.25 mg total) by mouth at bedtime as needed.  60 tablet  0  . benazepril (LOTENSIN) 40 MG tablet Take 1 tablet (40 mg total) by mouth daily.  30 tablet  1  . Cholecalciferol (VITAMIN D-3 PO) Take by mouth daily.      [name] doxazosin (CARDURA) 2 MG tablet Take 1 tablet (2 mg total) by mouth at bedtime.  30 tablet  3  . fish oil-omega-3 fatty acids 1000 MG capsule Take 2 g by mouth daily.      . hydrochlorothiazide (HYDRODIURIL) 25 MG tablet Take 1 tablet (25 mg total) by mouth daily.  30 tablet  1  . aspirin EC 81 MG tablet Take 1 tablet (81 mg total) by mouth daily.  30 tablet  0   No facility-administered medications prior to visit.    PE: Blood pressure 180/77, pulse 65, temperature 97.7 F (36.5 C), temperature source Oral, resp. rate 18, height  5' 9.5" (1.765 m), weight 177 lb (80.287 kg), SpO2 99.00%. Gen: Alert, well appearing.  Patient is oriented to person, place, time, and situation. CV: RRR, no m/r/g.   LUNGS: CTA bilat, nonlabored resps, good aeration in all lung fields. EXT: no clubbing, cyanosis, or edema.    IMPRESSION AND PLAN:  1) HTN; not ideal control but I'm holding meds where they are at this time b/c we're starting citalopram for his stress/anxiety. BMET today.  2) Stress/GAD: start citalopram 20mg  qd.  Gen: Alert, well appearing.  Patient is oriented to person, place, time, and situation.  An After Visit Summary was printed and given to the patient.  FOLLOW UP: 4 wks

## 2013-09-25 ENCOUNTER — Ambulatory Visit (INDEPENDENT_AMBULATORY_CARE_PROVIDER_SITE_OTHER): Payer: Medicare Other | Admitting: Family Medicine

## 2013-09-25 ENCOUNTER — Encounter: Payer: Self-pay | Admitting: Family Medicine

## 2013-09-25 VITALS — BP 202/96 | HR 65 | Temp 98.6°F | Resp 18 | Ht 69.5 in | Wt 179.0 lb

## 2013-09-25 DIAGNOSIS — F419 Anxiety disorder, unspecified: Secondary | ICD-10-CM

## 2013-09-25 DIAGNOSIS — I10 Essential (primary) hypertension: Secondary | ICD-10-CM

## 2013-09-25 DIAGNOSIS — F411 Generalized anxiety disorder: Secondary | ICD-10-CM

## 2013-09-25 MED ORDER — BENAZEPRIL HCL 40 MG PO TABS: 40.0000 mg | ORAL_TABLET | Freq: Every day | ORAL | Status: DC

## 2013-09-25 NOTE — Progress Notes (Signed)
OFFICE NOTE  09/25/2013  CC:  Chief Complaint  Patient presents with  . Follow-up   HPI: Patient is a 68 y.o. Caucasian male who is here for 1 mo f/u anxiety and HTN.   Feels good. Compliant with lotensin only: takes bp med at [facility] and bp is usually around 200/90, checks bp with wrist cuff at work and it is 130s/70s.  When he took HCTZ in the morning with lotensin he says his bp would be ok in mid morning but he felt "washed out". HR never above 80s.  He did not take the citalopram b/c he read the package insert and was scared of the side effects.  He says he prayed and feels better about his stress/anxiety.  ROS: no HA, no palpitations, no CP, no SOB, no vision complaints.  Pertinent PMH:  Past medical, surgical, social, and family history reviewed and no changes are noted since last office visit.  MEDS:  Outpatient Prescriptions Prior to Visit  Medication Sig Dispense Refill  . ALPRAZolam (XANAX) 0.25 MG tablet Take 1 tablet (0.25 mg total) by mouth at bedtime as needed.  60 tablet  0  . aspirin EC 81 MG tablet Take 1 tablet (81 mg total) by mouth daily.  30 tablet  0  . benazepril (LOTENSIN) 40 MG tablet Take 1 tablet (40 mg total) by mouth daily.  30 tablet  1  . Cholecalciferol (VITAMIN D-3 PO) Take by mouth daily.      . citalopram (CELEXA) 20 MG tablet Take 1 tablet (20 mg total) by mouth daily.  30 tablet  1  . doxazosin (CARDURA) 2 MG tablet Take 1 tablet (2 mg total) by mouth at bedtime.  30 tablet  3  . fish oil-omega-3 fatty acids 1000 MG capsule Take 2 g by mouth daily.      . hydrochlorothiazide (HYDRODIURIL) 25 MG tablet Take 1 tablet (25 mg total) by mouth daily.  30 tablet  1   No facility-administered medications prior to visit.    PE: Blood pressure 202/96, pulse 65, temperature 98.6 F (37 C), temperature source Temporal, resp. rate 18, height 5' 9.5" (1.765 m), weight 179 lb (81.194 kg), SpO2 99.00%. Gen: Alert, well appearing.  Patient is oriented to  person, place, time, and situation. AFFECT: pleasant, lucid thought and speech.   IMPRESSION AND PLAN:  1) uncontrolled HTN: restart HCTZ 25mg  qAM and move benazepril 40mg  dosing to qhs. No Aspirin until bp consistently under control.  2) GAD/Stress: he decided against trial of citalopram after all.  3) Preventative health: he declined flu vaccine and prevnar 13 today b/c he said his employer will be giving him these soon. I asked him to bring in documentation of dates of administration for our records at a future f/u visit.  An After Visit Summary was printed and given to the patient.  FOLLOW UP: 1 mo

## 2013-09-25 NOTE — Patient Instructions (Signed)
Take your HCTZ (hydrochlorothiazide) 25mg  every morning before work. Take your lotensin 40mg  every night at bedtime.

## 2013-09-25 NOTE — Addendum Note (Signed)
Addended by: [name] on: 09/25/2013 09:07 AM   Modules accepted: Orders

## 2013-09-25 NOTE — Progress Notes (Signed)
Pre visit review using our clinic review tool, if applicable. No additional management support is needed unless otherwise documented below in the visit note. 

## 2013-10-30 ENCOUNTER — Encounter: Payer: Self-pay | Admitting: Family Medicine

## 2013-10-30 ENCOUNTER — Ambulatory Visit (INDEPENDENT_AMBULATORY_CARE_PROVIDER_SITE_OTHER): Payer: Medicare Other | Admitting: Family Medicine

## 2013-10-30 VITALS — BP 171/84 | HR 61 | Temp 98.7°F | Resp 18 | Ht 69.5 in | Wt 177.0 lb

## 2013-10-30 DIAGNOSIS — I10 Essential (primary) hypertension: Secondary | ICD-10-CM

## 2013-10-30 DIAGNOSIS — E785 Hyperlipidemia, unspecified: Secondary | ICD-10-CM

## 2013-10-30 NOTE — Assessment & Plan Note (Signed)
Controlled as per home/work measurements, but would like to have these measurements be done by an upper arm cuff rather than wrist cuff. Continue current meds at this time.  He is going to purchase an upper arm bp cuff and monitor bp at home with it, call for persistent bp elevated >160/100. Otherwise return in 4 mo.

## 2013-10-30 NOTE — Progress Notes (Signed)
OFFICE NOTE  10/30/2013  CC:  Chief Complaint  Patient presents with  . Follow-up    not fasting     HPI: Patient is a 68 y.o. Caucasian male who is here for 4 wk f/u HTN. Denies side effects from meds now. Morning BP 130s-140s over 70s-80s (wrist cuff at work).  HR in daytime usually <70, after lotensin and doxazosin in evening it goes up over 70s.    Pertinent PMH:  Past medical, surgical, social, and family history reviewed and no changes are noted since last office visit.  MEDS:  Outpatient Prescriptions Prior to Visit  Medication Sig Dispense Refill  . ALPRAZolam (XANAX) 0.25 MG tablet Take 1 tablet (0.25 mg total) by mouth at bedtime as needed.  60 tablet  0  . aspirin EC 81 MG tablet Take 1 tablet (81 mg total) by mouth daily.  30 tablet  0  . benazepril (LOTENSIN) 40 MG tablet Take 1 tablet (40 mg total) by mouth daily.  30 tablet  11  . Cholecalciferol (VITAMIN D-3 PO) Take by mouth daily.      [name] doxazosin (CARDURA) 2 MG tablet Take 1 tablet (2 mg total) by mouth at bedtime.  30 tablet  3  . fish oil-omega-3 fatty acids 1000 MG capsule Take 2 g by mouth daily.      . hydrochlorothiazide (HYDRODIURIL) 25 MG tablet Take 1 tablet (25 mg total) by mouth daily.  30 tablet  1   No facility-administered medications prior to visit.    PE: Blood pressure 171/84, pulse 61, temperature 98.7 F (37.1 C), temperature source Temporal, resp. rate 18, height 5' 9.5" (1.765 m), weight 177 lb (80.287 kg), SpO2 96.00%. Gen: Alert, well appearing.  Patient is oriented to person, place, time, and situation. No further exam today.     Chemistry      Component Value Date/Time   NA 139 08/27/2013 0857   K 4.1 08/27/2013 0857   CL 106 08/27/2013 0857   CO2 31 08/27/2013 0857   BUN 23 08/27/2013 0857   CREATININE 0.8 08/27/2013 0857      Component Value Date/Time   CALCIUM 9.7 08/27/2013 0857   ALKPHOS 76 08/09/2013 0823   AST 23 08/09/2013 0823   ALT 17 08/09/2013 0823   BILITOT 0.8  08/09/2013 0823     Lab Results  Component Value Date   CHOL 159 08/09/2013   HDL 35.30* 08/09/2013   LDLCALC 109* 08/09/2013   TRIG 72.0 08/09/2013   CHOLHDL 5 08/09/2013    IMPRESSION AND PLAN:  Hypertension Controlled as per home/work measurements, but would like to have these measurements be done by an upper arm cuff rather than wrist cuff. Continue current meds at this time.  He is going to purchase an upper arm bp cuff and monitor bp at home with it, call for persistent bp elevated >160/100. Otherwise return in 4 mo.  Hyperlipidemia I recommended statin but pt really wanted to get back on fish oil 2-3 K g per day and see if this helped. He started this about 2 mo ago and we'll recheck FLP in about 4 mo.  An After Visit Summary was printed and given to the patient.  FOLLOW UP: 41mo, fasting BMET and FLP at that time

## 2013-10-30 NOTE — Progress Notes (Signed)
Pre visit review using our clinic review tool, if applicable. No additional management support is needed unless otherwise documented below in the visit note. 

## 2013-10-30 NOTE — Assessment & Plan Note (Signed)
I recommended statin but pt really wanted to get back on fish oil 2-3 K g per day and see if this helped. He started this about 2 mo ago and we'll recheck FLP in about 4 mo.

## 2013-11-25 ENCOUNTER — Other Ambulatory Visit: Payer: Self-pay | Admitting: Family Medicine

## 2013-12-16 ENCOUNTER — Telehealth: Payer: Self-pay | Admitting: Family Medicine

## 2013-12-16 MED ORDER — HYDROCHLOROTHIAZIDE 25 MG PO TABS: 25.0000 mg | ORAL_TABLET | Freq: Every day | ORAL | Status: DC

## 2013-12-16 NOTE — Telephone Encounter (Signed)
Rx sent to pharmacy   

## 2013-12-16 NOTE — Telephone Encounter (Signed)
Pt's pharmacy faxed request for HCTZ but put a note on the stating that pt told them you took him off HCTZ but now pt is requesting it to be filled.  Should patient be on HCTZ?  Please advise.

## 2013-12-16 NOTE — Telephone Encounter (Signed)
I do want him to STAY on HCTZ. Pls RF as previously prescribed, with 10 additional RF's.-thx

## 2014-02-03 ENCOUNTER — Other Ambulatory Visit: Payer: Self-pay | Admitting: Family Medicine

## 2014-02-03 NOTE — Telephone Encounter (Signed)
Rx request for xanax.  Patient last OV was 10/30/13.  Last Rx printed 08/09/13.  No rfs.  Please advise.

## 2014-02-27 ENCOUNTER — Ambulatory Visit (INDEPENDENT_AMBULATORY_CARE_PROVIDER_SITE_OTHER): Payer: Medicare Other | Admitting: Family Medicine

## 2014-02-27 ENCOUNTER — Encounter: Payer: Self-pay | Admitting: Family Medicine

## 2014-02-27 VITALS — BP 179/99 | HR 59 | Temp 98.6°F | Resp 16 | Ht 69.5 in | Wt 177.0 lb

## 2014-02-27 DIAGNOSIS — I10 Essential (primary) hypertension: Secondary | ICD-10-CM

## 2014-02-27 DIAGNOSIS — N401 Enlarged prostate with lower urinary tract symptoms: Secondary | ICD-10-CM | POA: Diagnosis not present

## 2014-02-27 DIAGNOSIS — R972 Elevated prostate specific antigen [PSA]: Secondary | ICD-10-CM

## 2014-02-27 DIAGNOSIS — E785 Hyperlipidemia, unspecified: Secondary | ICD-10-CM

## 2014-02-27 DIAGNOSIS — F17200 Nicotine dependence, unspecified, uncomplicated: Secondary | ICD-10-CM

## 2014-02-27 LAB — PSA, MEDICARE: PSA: 13.05 ng/ml — ABNORMAL HIGH (ref 0.10–4.00)

## 2014-02-27 NOTE — Progress Notes (Signed)
Pre visit review using our clinic review tool, if applicable. No additional management support is needed unless otherwise documented below in the visit note. 

## 2014-02-27 NOTE — Progress Notes (Signed)
OFFICE NOTE  02/27/2014  CC:  Chief Complaint  Patient presents with  . Follow-up    fasting     HPI: Patient is a 69 y.o. Caucasian male who is here for 4 mo f/u HTN, tob dep, hyperlipidemia. Has been trying TLC, due for f/u lipid panel.  Continues to smoke, not contemplating quitting at this time.  HTN: occ diastolics 47-096 in the mornings before taking med.  But after med, checks later in the day are normal. Has upper arm cuff now.    Cholesterol: sounds like diet hasn't changed much.  Taking fish oil and oat meal.  He is not interested In ever taking a cholesterol-lowering med again.  Hx of elevated PSA's (see PMH): says "it may be up more b/c I've been riding my tractor and in a bouncy truck a lot lately".    ROS: no HAs, no CP, no dizziness    Pertinent PMH:  Past medical, surgical, social, and family history reviewed and no changes are noted since last office visit.  MEDS:  Outpatient Prescriptions Prior to Visit  Medication Sig Dispense Refill  . ALPRAZolam (XANAX) 0.25 MG tablet TAKE ONE TABLET BY MOUTH AT BEDTIME AS NEEDED 30 tablet 5  . aspirin EC 81 MG tablet Take 1 tablet (81 mg total) by mouth daily. 30 tablet 0  . benazepril (LOTENSIN) 40 MG tablet Take 1 tablet (40 mg total) by mouth daily. 30 tablet 11  . Cholecalciferol (VITAMIN D-3 PO) Take by mouth daily.    [name] doxazosin (CARDURA) 2 MG tablet TAKE ONE TABLET BY MOUTH AT BEDTIME 30 tablet 3  . fish oil-omega-3 fatty acids 1000 MG capsule Take 2 g by mouth daily.    . hydrochlorothiazide (HYDRODIURIL) 25 MG tablet Take 1 tablet (25 mg total) by mouth daily. 30 tablet 10   No facility-administered medications prior to visit.    PE: Blood pressure 179/99, pulse 59, temperature 98.6 F (37 C), temperature source Temporal, resp. rate 16, height 5' 9.5" (1.765 m), weight 177 lb (80.287 kg), SpO2 99 %. Gen: Alert, well appearing.  Patient is oriented to person, place, time, and situation. No further exam  today.  Lab Results  Component Value Date   CHOL 159 08/09/2013   HDL 35.30* 08/09/2013   LDLCALC 109* 08/09/2013   TRIG 72.0 08/09/2013   CHOLHDL 5 08/09/2013      IMPRESSION AND PLAN:  1) HTN; controlled as per home monitoring.  2) Hyperlipidemia: last lipid check ok on TLC. He is not interested in ever being on meds again.  No recheck of lipids needed.  3) Elevated PSA (see PMH section for details): repeat PSA due today.  4) Tob dependence: encouraged complete cessation but pt not contemplating at this time.  An After Visit Summary was printed and given to the patient.  FOLLOW UP: 6 mo for 48min f/u visit.

## 2014-03-28 ENCOUNTER — Other Ambulatory Visit: Payer: Self-pay | Admitting: Family Medicine

## 2014-05-29 ENCOUNTER — Other Ambulatory Visit: Payer: Self-pay | Admitting: Family Medicine

## 2014-05-29 NOTE — Telephone Encounter (Signed)
Spoke to pharmacy.  Pt should not be due for refill.  His last Rx was printed 02/03/14 x 5 rfs.  The pharmacy does not have that Rx.  They will contact the patient to see if he has the new rx given to him in January.

## 2014-06-02 ENCOUNTER — Other Ambulatory Visit: Payer: Self-pay | Admitting: Family Medicine

## 2014-06-02 ENCOUNTER — Telehealth: Payer: Self-pay | Admitting: Family Medicine

## 2014-06-02 MED ORDER — ALPRAZOLAM 0.25 MG PO TABS: ORAL_TABLET | ORAL | Status: DC

## 2014-06-02 NOTE — Telephone Encounter (Signed)
Pt called stating that the Rx for alprazolam that was given to him in January at his last OV was supposed to be put on file at his pharmacy but they apparently lost this Rx.  Can patient get new Rx?  He hasn't filled anything from his January Rx.  Please advise.

## 2014-06-02 NOTE — Telephone Encounter (Signed)
Looking into this further,  Pharmacy requested rf 02/03/14 and you approved it but it doesn't show where anyone faxed that script.  Please advise.

## 2014-06-02 NOTE — Telephone Encounter (Signed)
Rx faxed

## 2014-06-02 NOTE — Telephone Encounter (Signed)
Alpraz rx printed.

## 2014-08-05 ENCOUNTER — Other Ambulatory Visit: Payer: Self-pay | Admitting: Family Medicine

## 2014-08-05 NOTE — Telephone Encounter (Signed)
RF request for doxazosin.  LOV: 02/27/14 Next ov: 08/28/14 Last written: 03/28/14 #30 w/ 3RF

## 2014-08-28 ENCOUNTER — Ambulatory Visit (INDEPENDENT_AMBULATORY_CARE_PROVIDER_SITE_OTHER): Payer: Medicare Other | Admitting: Family Medicine

## 2014-08-28 ENCOUNTER — Encounter: Payer: Self-pay | Admitting: Family Medicine

## 2014-08-28 VITALS — BP 158/82 | HR 46 | Temp 98.0°F | Resp 16 | Ht 69.5 in | Wt 176.0 lb

## 2014-08-28 DIAGNOSIS — I10 Essential (primary) hypertension: Secondary | ICD-10-CM

## 2014-08-28 DIAGNOSIS — R972 Elevated prostate specific antigen [PSA]: Secondary | ICD-10-CM

## 2014-08-28 DIAGNOSIS — F17200 Nicotine dependence, unspecified, uncomplicated: Secondary | ICD-10-CM | POA: Diagnosis not present

## 2014-08-28 DIAGNOSIS — E785 Hyperlipidemia, unspecified: Secondary | ICD-10-CM

## 2014-08-28 DIAGNOSIS — F419 Anxiety disorder, unspecified: Secondary | ICD-10-CM

## 2014-08-28 LAB — BASIC METABOLIC PANEL
BUN: 19 mg/dL (ref 6–23)
CHLORIDE: 108 meq/L (ref 96–112)
CO2: 28 mEq/L (ref 19–32)
Calcium: 9.6 mg/dL (ref 8.4–10.5)
Creatinine, Ser: 0.88 mg/dL (ref 0.40–1.50)
GFR: 91.18 mL/min (ref >60.00)
GLUCOSE: 93 mg/dL (ref 70–99)
Potassium: 4.4 mEq/L (ref 3.5–5.1)
SODIUM: 143 meq/L (ref 135–145)

## 2014-08-28 LAB — PSA: PSA: 14.95 ng/mL — ABNORMAL HIGH (ref 0.10–4.00)

## 2014-08-28 MED ORDER — BENAZEPRIL HCL 40 MG PO TABS: ORAL_TABLET | ORAL | Status: DC

## 2014-08-28 NOTE — Progress Notes (Signed)
Pre visit review using our clinic review tool, if applicable. No additional management support is needed unless otherwise documented below in the visit note. 

## 2014-08-28 NOTE — Progress Notes (Signed)
OFFICE VISIT  08/28/2014   CC:  Chief Complaint  Patient presents with  . Follow-up    Pt is fasting.    HPI:    Patient is a 69 y.o. Caucasian male who presents for 6 mo f/u HTN, tob dep, hx of elevated PSAs/BPH--needs q105mo PSAs (see PMH below). Morning bp 140-170 syst, 90 diast avg. PM bp fine.  Takes 1/2 HCTZ qAM b/c if takes whole he finds bp sometimes drops to 100/40.   Tob: cut back to 2-3 cigs per day. Anxiety: son going through divorce-nasty.  Takes xanax prn.   Hx of hyperlipidemia but pt refuses further trial of cholesterol meds after being intolerant of statins.  Past Medical History  Diagnosis Date  . Hypertension   . Anxiety   . Atrial fibrillation     Limited episode, emergency room, June, 2013, spontaneous conversion to sinus rhythm  . Ejection fraction     EF 65%, echo, 2008  . Nephrolithiasis   . 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 (Dr. Maryland Pink retired then Dr. Elnoria Howard retired)--latest PSA 12.34 on 08/16/12 --Dr. Elnoria Howard wanted to see him again if his PSA got over 14.  PSA 03/01/13 was12 and has been < 14 since that time, including 01/2014--continue with repeat PSA q37mo.  . Diverticulitis   . GERD (gastroesophageal reflux disease)   . Tobacco dependence   . BPH (benign prostatic hypertrophy) with urinary obstruction     Nocturia is his primary symptom  . Has received pneumococcal vaccination     Past Surgical History  Procedure Laterality Date  . Lithotripsy  2004    Dr. [place] Pink in [place].  . Inguinal hernia repair  2003 and 2004    Both sides.  . Colonoscopy  approx 2011 per pt    Done by surgeon in [place] after a bout of diverticulitis; normal per pt report--was told to repeat in 10 yrs.  . Aortic ultrasound  07/2012    NO AAA    Outpatient Prescriptions Prior to Visit  Medication Sig Dispense Refill  . ALPRAZolam (XANAX) 0.25 MG tablet 1 tab po qhs prn 30  tablet 5  . aspirin EC 81 MG tablet Take 1 tablet (81 mg total) by mouth daily. 30 tablet 0  . Cholecalciferol (VITAMIN D-3 PO) Take by mouth daily.    [name] doxazosin (CARDURA) 2 MG tablet TAKE ONE TABLET BY MOUTH AT BEDTIME 30 tablet 3  . fish oil-omega-3 fatty acids 1000 MG capsule Take 2 g by mouth daily.    . hydrochlorothiazide (HYDRODIURIL) 25 MG tablet Take 1 tablet (25 mg total) by mouth daily. 30 tablet 10  . benazepril (LOTENSIN) 40 MG tablet Take 1 tablet (40 mg total) by mouth daily. 30 tablet 11   No facility-administered medications prior to visit.    Allergies  Allergen Reactions  . Amlodipine     LE swelling    ROS As per HPI  PE: Blood pressure 158/82, pulse 46, temperature 98 F (36.7 C), temperature source Oral, resp. rate 16, height 5' 9.5" (1.765 m), weight 176 lb (79.833 kg), SpO2 99 %.Manual bp recheck was 158/82 Gen: Alert, well appearing.  Patient is oriented to person, place, time, and situation. AST:MHDQ: no injection, icteris, swelling, or exudate.  EOMI, PERRLA. Mouth: lips without lesion/swelling.  Oral mucosa pink and moist. Oropharynx without erythema, exudate, or swelling.  Neck: supple/nontender.  No LAD, mass, or TM.  Carotid pulses 2+ bilaterally, without bruits. CV: Regular, mild bradycardia at about 55, no m/r/g.   LUNGS: CTA bilat, nonlabored resps, good aeration in all lung fields. EXT: no clubbing, cyanosis, or edema.   LABS:  Lab Results  Component Value Date   TSH 0.96 08/16/2012   Lab Results  Component Value Date   WBC 6.4 08/16/2012   HGB 15.3 08/16/2012   HCT 45.3 08/16/2012   MCV 93.9 08/16/2012   PLT 178.0 08/16/2012   Lab Results  Component Value Date   CREATININE 0.8 08/27/2013   BUN 23 08/27/2013   NA 139 08/27/2013   K 4.1 08/27/2013   CL 106 08/27/2013   CO2 31 08/27/2013   Lab Results  Component Value Date   ALT 17 08/09/2013   AST 23 08/09/2013   ALKPHOS 76 08/09/2013   BILITOT 0.8 08/09/2013   Lab  Results  Component Value Date   CHOL 159 08/09/2013   Lab Results  Component Value Date   HDL 35.30* 08/09/2013   Lab Results  Component Value Date   LDLCALC 109* 08/09/2013   Lab Results  Component Value Date   TRIG 72.0 08/09/2013   Lab Results  Component Value Date   CHOLHDL 5 08/09/2013   Lab Results  Component Value Date   PSA 13.05* 02/27/2014   PSA 13.82* 08/09/2013   PSA 11.98* 03/01/2013   IMPRESSION AND PLAN:  1) Uncontrolled HTN: add 1/2 of 40mg  benazapril dose in AM, continue 40mg  benazapril qPM. Continue 1/2 of 25mg  HCTZ qAM. BMET today.  2) BPH with elevated PSA: doing well on cardura and we'll recheck PSA today.  3) Tob dependence: encouraged pt to quit completely  4) Anxiety: stable.  The current medical regimen is effective;  continue present plan and medications.  5) Hyperlipidemia: statinn intolerant.  Pt refuses further cholesterol lowering med trials.  6) Prev health care: pt declines prevnar 13 today.  An After Visit Summary was printed and given to the patient.  FOLLOW UP: Return for 2-3 week f/u for uncontrolled hypertension.

## 2014-08-30 ENCOUNTER — Encounter: Payer: Self-pay | Admitting: Family Medicine

## 2014-09-15 DIAGNOSIS — H2513 Age-related nuclear cataract, bilateral: Secondary | ICD-10-CM | POA: Diagnosis not present

## 2014-09-15 DIAGNOSIS — H40033 Anatomical narrow angle, bilateral: Secondary | ICD-10-CM | POA: Diagnosis not present

## 2014-09-18 ENCOUNTER — Ambulatory Visit (INDEPENDENT_AMBULATORY_CARE_PROVIDER_SITE_OTHER): Payer: 59 | Admitting: Family Medicine

## 2014-09-18 ENCOUNTER — Encounter: Payer: Self-pay | Admitting: Family Medicine

## 2014-09-18 VITALS — BP 132/82 | HR 65 | Temp 97.5°F | Resp 16 | Ht 69.5 in | Wt 175.0 lb

## 2014-09-18 DIAGNOSIS — I10 Essential (primary) hypertension: Secondary | ICD-10-CM | POA: Diagnosis not present

## 2014-09-18 DIAGNOSIS — R001 Bradycardia, unspecified: Secondary | ICD-10-CM | POA: Diagnosis not present

## 2014-09-18 DIAGNOSIS — R002 Palpitations: Secondary | ICD-10-CM | POA: Diagnosis not present

## 2014-09-18 DIAGNOSIS — R972 Elevated prostate specific antigen [PSA]: Secondary | ICD-10-CM | POA: Diagnosis not present

## 2014-09-18 NOTE — Progress Notes (Signed)
Pre visit review using our clinic review tool, if applicable. No additional management support is needed unless otherwise documented below in the visit note. 

## 2014-09-18 NOTE — Progress Notes (Signed)
OFFICE VISIT  09/18/2014   CC:  Chief Complaint  Patient presents with  . Follow-up    Pt is not fasting.     HPI:    Patient is a 69 y.o. Caucasian male who presents for 3 week f/u HTN. Morning bp's 140-145 over 80-85.  Later in the day 120-130/60-70.  As the day goes on it gets better.  No further low bp readings or feeling of low bp.  Patient says he occasionally has palpitations, says he was given alprazolam for this in the past.  See PMH section re: past hx of a-fib.  No chest pain, no SOB with exertion, no dizziness, no presyncope.  Pt with hx of elevated psa and he is going to see Dr. Lum Babe for f/u of this soon.  Past Medical History  Diagnosis Date  . Hypertension   . Anxiety   . Atrial fibrillation     Limited episode, emergency room, June, 2013, spontaneous conversion to sinus rhythm  . Ejection fraction     EF 65%, echo, 2008  . Nephrolithiasis   . 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 (Dr. Maryland Pink retired then Dr. Elnoria Howard retired)--latest PSA 12.34 on 08/16/12 --Dr. Elnoria Howard wanted to see him again if his PSA got over 14.  PSA 03/01/13 was12 and has been < 14 since that time, including 01/2014.  Then PSA 14.95 08/2014 so pt referred back to Dr. Elnoria Howard.  . Diverticulitis   . GERD (gastroesophageal reflux disease)   . Tobacco dependence   . BPH (benign prostatic hypertrophy) with urinary obstruction     Nocturia is his primary symptom  . Has received pneumococcal vaccination     Past Surgical History  Procedure Laterality Date  . Lithotripsy  2004    Dr. Maryland Pink in [place].  . Inguinal hernia repair  2003 and 2004    Both sides.  . Colonoscopy  approx 2011 per pt    Done by surgeon in [place] after a bout of diverticulitis; normal per pt report--was told to repeat in 10 yrs.  . Aortic ultrasound  07/2012    NO AAA  . Transthoracic echocardiogram  03/22/2006    EF 65%, normal  valves, no wall motion abnormalties, LA size normal    Outpatient Prescriptions Prior to Visit  Medication Sig Dispense Refill  . ALPRAZolam (XANAX) 0.25 MG tablet 1 tab po qhs prn 30 tablet 5  . aspirin EC 81 MG tablet Take 1 tablet (81 mg total) by mouth daily. 30 tablet 0  . benazepril (LOTENSIN) 40 MG tablet 1/2 tab po qAM and 1 tab po qPM 45 tablet 12  . Cholecalciferol (VITAMIN D-3 PO) Take by mouth daily.    [name] doxazosin (CARDURA) 2 MG tablet TAKE ONE TABLET BY MOUTH AT BEDTIME 30 tablet 3  . fish oil-omega-3 fatty acids 1000 MG capsule Take 2 g by mouth daily.    . hydrochlorothiazide (HYDRODIURIL) 25 MG tablet Take 1 tablet (25 mg total) by mouth daily. (Patient taking differently: Take 25 mg by mouth daily. Pt takes 1/2 qd) 30 tablet 10   No facility-administered medications prior to visit.    Allergies  Allergen Reactions  . Amlodipine     LE swelling    ROS As per HPI  PE: Blood pressure 132/82, pulse 65, temperature 97.5 F (36.4 C), temperature source Oral, resp. rate 16, height 5' 9.5" (  1.765 m), weight 175 lb (79.379 kg), SpO2 98 %. Gen: Alert, well appearing.  Patient is oriented to person, place, time, and situation. CV: Regular rhythm with occasional skipped beat or ectopic beat.  No m/r/g Chest is clear, no wheezing or rales. Normal symmetric air entry throughout both lung fields. No chest wall deformities or tenderness.   LABS:  12 lead EKG today: sinus bradycardia, rate 58/min, Poor R wave progression.  No signif change compared to 07/2011.  IMPRESSION AND PLAN:  1) HTN; The current medical regimen is effective;  continue present plan and medications.  2) palpitations: stable, not bothering patient.  He has no dysrhythmia that puts him at risk for CVA.  3) Hx of elevated PSA: he is to get f/u with urologist Dr. Lum Babe soon.  An After Visit Summary was printed and given to the patient.  FOLLOW UP: Return in about 4 months (around 01/18/2015) for  routine chronic illness f/u (fasting).

## 2014-11-06 ENCOUNTER — Emergency Department (HOSPITAL_BASED_OUTPATIENT_CLINIC_OR_DEPARTMENT_OTHER)
Admission: EM | Admit: 2014-11-06 | Discharge: 2014-11-06 | Disposition: A | Discharge status: Home / Self Care | Payer: Medicare Other | Attending: Emergency Medicine | Admitting: Emergency Medicine

## 2014-11-06 ENCOUNTER — Encounter (HOSPITAL_BASED_OUTPATIENT_CLINIC_OR_DEPARTMENT_OTHER): Payer: Self-pay | Admitting: Emergency Medicine

## 2014-11-06 DIAGNOSIS — Z720 Tobacco use: Secondary | ICD-10-CM | POA: Diagnosis not present

## 2014-11-06 DIAGNOSIS — Z8719 Personal history of other diseases of the digestive system: Secondary | ICD-10-CM | POA: Diagnosis not present

## 2014-11-06 DIAGNOSIS — L509 Urticaria, unspecified: Secondary | ICD-10-CM | POA: Diagnosis not present

## 2014-11-06 DIAGNOSIS — Z87448 Personal history of other diseases of urinary system: Secondary | ICD-10-CM | POA: Diagnosis not present

## 2014-11-06 DIAGNOSIS — I10 Essential (primary) hypertension: Secondary | ICD-10-CM | POA: Insufficient documentation

## 2014-11-06 DIAGNOSIS — F419 Anxiety disorder, unspecified: Secondary | ICD-10-CM | POA: Insufficient documentation

## 2014-11-06 DIAGNOSIS — R21 Rash and other nonspecific skin eruption: Secondary | ICD-10-CM | POA: Diagnosis present

## 2014-11-06 DIAGNOSIS — Z7982 Long term (current) use of aspirin: Secondary | ICD-10-CM | POA: Insufficient documentation

## 2014-11-06 DIAGNOSIS — Z79899 Other long term (current) drug therapy: Secondary | ICD-10-CM | POA: Insufficient documentation

## 2014-11-06 MED ORDER — CETIRIZINE HCL 10 MG PO CAPS: 10.0000 mg | ORAL_CAPSULE | Freq: Every day | ORAL | Status: DC

## 2014-11-06 MED ORDER — PREDNISONE 20 MG PO TABS: ORAL_TABLET | ORAL | Status: DC

## 2014-11-06 MED ORDER — PREDNISONE 10 MG PO TABS
60.0000 mg | ORAL_TABLET | Freq: Once | ORAL | Status: AC
  Administered 2014-11-06: 60 mg via ORAL
  Filled 2014-11-06 (×2): qty 1

## 2014-11-06 MED ORDER — FAMOTIDINE 20 MG PO TABS
20.0000 mg | ORAL_TABLET | Freq: Once | ORAL | Status: AC
  Administered 2014-11-06: 20 mg via ORAL
  Filled 2014-11-06: qty 1

## 2014-11-06 MED ORDER — FEXOFENADINE HCL 60 MG PO TABS: 60.0000 mg | ORAL_TABLET | Freq: Two times a day (BID) | ORAL | Status: DC

## 2014-11-06 NOTE — ED Provider Notes (Signed)
CSN: [id number]     Arrival date & time 11/06/14  1847 History   First MD Initiated Contact with Patient 11/06/14 1857     Chief Complaint  Patient presents with  . Rash     (Consider location/radiation/quality/duration/timing/severity/associated sxs/prior Treatment) HPI Comments: Patient presents with rash. He states about 4:00 this afternoon he noticed a small red area with itchiness to his right thigh. He states over the next couple hours it rapidly spread throughout his whole legs and upper back. He states it's very itchy. He denies any shortness of breath or wheezing. There is no facial swelling or sore throat. He denies any history of allergies in the past although he does occasionally have a rash behind his ears which self resolves. He denies any known new exposures. There is no new medications. He denies any known insect bites.  Patient is a 69 y.o. male presenting with rash.  Rash Associated symptoms: no abdominal pain, no diarrhea, no fatigue, no fever, no headaches, no joint pain, no nausea, no shortness of breath and not vomiting     Past Medical History  Diagnosis Date  . Hypertension   . Anxiety   . Atrial fibrillation ([place])     Limited episode, emergency room, June, 2013, spontaneous conversion to sinus rhythm  . Ejection fraction     EF 65%, echo, 2008  . Nephrolithiasis   . 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 (Dr. Maryland Pink retired then Dr. Elnoria Howard retired)--latest PSA 12.34 on 08/16/12 --Dr. Elnoria Howard wanted to see him again if his PSA got over 14.  PSA 03/01/13 was12 and has been < 14 since that time, including 01/2014.  Then PSA 14.95 08/2014 so pt referred back to Dr. Elnoria Howard.  . Diverticulitis   . GERD (gastroesophageal reflux disease)   . Tobacco dependence   . BPH (benign prostatic hypertrophy) with urinary obstruction     Nocturia is his primary symptom  . Has received  pneumococcal vaccination    Past Surgical History  Procedure Laterality Date  . Lithotripsy  2004    Dr. Maryland Pink in [place].  . Inguinal hernia repair  2003 and 2004    Both sides.  . Colonoscopy  approx 2011 per pt    Done by surgeon in [place] after a bout of diverticulitis; normal per pt report--was told to repeat in 10 yrs.  . Aortic ultrasound  07/2012    NO AAA  . Transthoracic echocardiogram  03/22/2006    EF 65%, normal valves, no wall motion abnormalties, LA size normal   Family History  Problem Relation Age of Onset  . Hypertension Mother   . Cancer Father     Lung ca age 3  . Diabetes Sister   . Cancer - Other Mother 45    breast   Social History  Substance Use Topics  . Smoking status: Current Every Day Smoker    Types: Cigarettes  . Smokeless tobacco: Never Used  . Alcohol Use: No    Review of Systems  Constitutional: Negative for fever, chills, diaphoresis and fatigue.  HENT: Negative for congestion, rhinorrhea and sneezing.   Eyes: Negative.   Respiratory: Negative for cough, chest tightness and shortness of breath.   Cardiovascular: Negative for chest pain and leg swelling.  Gastrointestinal: Negative for nausea, vomiting, abdominal pain, diarrhea and blood in stool.  Genitourinary: Negative for frequency, hematuria, flank pain  and difficulty urinating.  Musculoskeletal: Negative for back pain and arthralgias.  Skin: Positive for rash.  Neurological: Negative for dizziness, speech difficulty, weakness, numbness and headaches.      Allergies  Amlodipine  Home Medications   Prior to Admission medications   Medication Sig Start Date End Date Taking? Authorizing Provider  ALPRAZolam Duanne Moron) 0.25 MG tablet 1 tab po qhs prn 06/02/14   [name], MD  aspirin EC 81 MG tablet Take 1 tablet (81 mg total) by mouth daily. 04/15/13   [name], MD  benazepril (LOTENSIN) 40 MG tablet 1/2 tab po qAM and 1 tab po qPM 08/28/14   [name], MD   Cholecalciferol (VITAMIN D-3 PO) Take by mouth daily.    Historical Provider, MD  doxazosin (CARDURA) 2 MG tablet TAKE ONE TABLET BY MOUTH AT BEDTIME 08/05/14   [name], MD  fexofenadine (ALLEGRA) 60 MG tablet Take 1 tablet (60 mg total) by mouth 2 (two) times daily. 11/06/14   Malvin Johns, MD  fish oil-omega-3 fatty acids 1000 MG capsule Take 2 g by mouth daily.    Historical Provider, MD  hydrochlorothiazide (HYDRODIURIL) 25 MG tablet Take 1 tablet (25 mg total) by mouth daily. Patient taking differently: Take 25 mg by mouth daily. Pt takes 1/2 qd 12/16/13   [name], MD  predniSONE (DELTASONE) 20 MG tablet Take one tablet daily for 5 days 11/06/14   Malvin Johns, MD   BP 163/92 mmHg  Pulse 76  Temp(Src) 98 F (36.7 C) (Oral)  Resp 18  Ht 5\' 11"  (1.803 m)  Wt 178 lb (80.74 kg)  BMI 24.84 kg/m2  SpO2 99% Physical Exam  Constitutional: He is oriented to person, place, and time. He appears well-developed and well-nourished.  HENT:  Head: Normocephalic and atraumatic.  No angioedema  Eyes: Pupils are equal, round, and reactive to light.  Neck: Normal range of motion. Neck supple.  Cardiovascular: Normal rate, regular rhythm and normal heart sounds.   Pulmonary/Chest: Effort normal and breath sounds normal. No respiratory distress. He has no wheezes. He has no rales. He exhibits no tenderness.  Abdominal: Soft. Bowel sounds are normal. There is no tenderness. There is no rebound and no guarding.  Musculoskeletal: Normal range of motion. He exhibits no edema.  Lymphadenopathy:    He has no cervical adenopathy.  Neurological: He is alert and oriented to person, place, and time.  Skin: Skin is warm and dry. Rash noted.  Patient has an urticarial type rash to his lower back and bilateral lower extremities. It's raised, erythematous and confluent in areas. There is no target lesions. It is blanching. There's no petechiae or purpura. There is no vesicles. There is no rash to  the palms or soles.  Psychiatric: He has a normal mood and affect.    ED Course  Procedures (including critical care time) Labs Review Labs Reviewed - No data to display  Imaging Review No results found. I have personally reviewed and evaluated these images and lab results as part of my medical decision-making.   EKG Interpretation None      MDM   Final diagnoses:  Urticaria    Patient presents with a rash that appears to be an urticarial type rash. He doesn't have any new exposures or new medications. There is no airway involvement or angioedema. The rapid progression of symptoms doesn't favor other etiology such as a tick borne illness. I will start him on a course of prednisone  and Allegra. He's been taking a dose of Benadryl when he gets home tonight. I advised him to follow-up with his PCP if his symptoms are not improving or return here as needed for any worsening symptoms.    Malvin Johns, MD 11/06/14 [id number]

## 2014-11-06 NOTE — Discharge Instructions (Signed)
Hives Hives are itchy, red, swollen areas of the skin. They can vary in size and location on your body. Hives can come and go for hours or several days (acute hives) or for several weeks (chronic hives). Hives do not spread from person to person (noncontagious). They may get worse with scratching, exercise, and emotional stress. CAUSES   Allergic reaction to food, additives, or drugs.  Infections, including the common cold.  Illness, such as vasculitis, lupus, or thyroid disease.  Exposure to sunlight, heat, or cold.  Exercise.  Stress.  Contact with chemicals. SYMPTOMS   Red or white swollen patches on the skin. The patches may change size, shape, and location quickly and repeatedly.  Itching.  Swelling of the hands, feet, and face. This may occur if hives develop deeper in the skin. DIAGNOSIS  Your caregiver can usually tell what is wrong by performing a physical exam. Skin or blood tests may also be done to determine the cause of your hives. In some cases, the cause cannot be determined. TREATMENT  Mild cases usually get better with medicines such as antihistamines. Severe cases may require an emergency epinephrine injection. If the cause of your hives is known, treatment includes avoiding that trigger.  HOME CARE INSTRUCTIONS   Avoid causes that trigger your hives.  Take antihistamines as directed by your caregiver to reduce the severity of your hives. Non-sedating or low-sedating antihistamines are usually recommended. Do not drive while taking an antihistamine.  Take any other medicines prescribed for itching as directed by your caregiver.  Wear loose-fitting clothing.  Keep all follow-up appointments as directed by your caregiver. SEEK MEDICAL CARE IF:   You have persistent or severe itching that is not relieved with medicine.  You have painful or swollen joints. SEEK IMMEDIATE MEDICAL CARE IF:   You have a fever.  Your tongue or lips are swollen.  You have  trouble breathing or swallowing.  You feel tightness in the throat or chest.  You have abdominal pain. These problems may be the first sign of a life-threatening allergic reaction. Call your local emergency services (911 in [place] MAKE SURE YOU:   Understand these instructions.  Will watch your condition.  Will get help right away if you are not doing well or get worse.   This information is not intended to replace advice given to you by your health care provider. Make sure you discuss any questions you have with your health care provider.   Document Released: 01/17/2005 Document Revised: 01/22/2013 Document Reviewed: 04/12/2011 Elsevier Interactive Patient Education 2016 Elsevier Inc.  

## 2014-11-06 NOTE — ED Notes (Signed)
Patient states that he started to have welts to his bilateral lower extrmities starting at 4 om today and it is worsening now down into his feet.

## 2014-11-14 ENCOUNTER — Encounter: Payer: Self-pay | Admitting: Family Medicine

## 2014-11-14 ENCOUNTER — Ambulatory Visit (INDEPENDENT_AMBULATORY_CARE_PROVIDER_SITE_OTHER): Payer: Medicare Other | Admitting: Family Medicine

## 2014-11-14 VITALS — BP 152/85 | HR 68 | Temp 99.0°F | Resp 18 | Ht 69.5 in | Wt 178.0 lb

## 2014-11-14 DIAGNOSIS — R972 Elevated prostate specific antigen [PSA]: Secondary | ICD-10-CM

## 2014-11-14 DIAGNOSIS — N390 Urinary tract infection, site not specified: Secondary | ICD-10-CM

## 2014-11-14 DIAGNOSIS — R300 Dysuria: Secondary | ICD-10-CM | POA: Diagnosis not present

## 2014-11-14 HISTORY — DX: Urinary tract infection, site not specified: N39.0

## 2014-11-14 LAB — POCT URINALYSIS DIPSTICK
Bilirubin, UA: NEGATIVE
Glucose, UA: NEGATIVE
Ketones, UA: NEGATIVE
NITRITE UA: POSITIVE
PH UA: 6.5
PROTEIN UA: NEGATIVE
RBC UA: NEGATIVE
Spec Grav, UA: 1.025
UROBILINOGEN UA: 1

## 2014-11-14 MED ORDER — PHENAZOPYRIDINE HCL 100 MG PO TABS: 100.0000 mg | ORAL_TABLET | Freq: Three times a day (TID) | ORAL | Status: DC | PRN

## 2014-11-14 MED ORDER — CIPROFLOXACIN HCL 500 MG PO TABS: 500.0000 mg | ORAL_TABLET | Freq: Two times a day (BID) | ORAL | Status: DC

## 2014-11-14 NOTE — Progress Notes (Signed)
Subjective:    Patient ID: Daniel [name]., male    DOB: August 26, 1945, 69 y.o.   MRN: [id number]  HPI  Urinary symptoms: Patient presents with complaints of dysuria that started 3 nights ago. He states it started burning with his urination, and he noticed some lower abdominal discomfort. Patient states he had a prior history of UTIs in the past, he takes could do her for his BPH. 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 He states he was worked up by urology, and according to notes urology only needed to see him again if his PSA was above 14. Patient had a PSA in July that was above 14, and he was referred to urology. He is yet to follow-up with urology, he states that his prior urologist both have retired. He is looking for urology closer to [facility] and is awaiting to hear from a urologist there now. He states that he should know within a week. Patient denies fevers, chills, back pain, nausea or vomiting.  Patient states his urinary stream is normal for him.   Past Medical History  Diagnosis Date  . Hypertension   . Anxiety   . Atrial fibrillation ([place])     Limited episode, emergency room, June, 2013, spontaneous conversion to sinus rhythm  . Ejection fraction     EF 65%, echo, 2008  . Nephrolithiasis   . 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 (Dr. Maryland Pink retired then Dr. Elnoria Howard retired)--latest PSA 12.34 on 08/16/12 --Dr. Elnoria Howard wanted to see him again if his PSA got over 14.  PSA 03/01/13 was12 and has been < 14 since that time, including 01/2014.  Then PSA 14.95 08/2014 so pt referred back to Dr. Elnoria Howard.  . Diverticulitis   . GERD (gastroesophageal reflux disease)   . Tobacco dependence   . BPH (benign prostatic hypertrophy) with urinary obstruction     Nocturia is his primary symptom  . Has received pneumococcal vaccination    Social  History   Social History  . Marital Status: Married    Spouse Name: N/A  . Number of Children: N/A  . Years of Education: N/A   Occupational History  . Not on file.   Social History Main Topics  . Smoking status: Current Every Day Smoker    Types: Cigarettes  . Smokeless tobacco: Never Used  . Alcohol Use: No  . Drug Use: No  . Sexual Activity: Not on file   Other Topics Concern  . Not on file   Social History Narrative   Married, 2 grown children.   HS education.  Orig from [place], lives there now.   Occupation: maintenance.  Drives a tractor a lot, drives a pickup truck a lot, rides horses a lot.   Tobacco: 20 pack-yr hx (current as of 07/2012)   No drugs.   Alcohol: none   Family History  Problem Relation Age of Onset  . Hypertension Mother   . Cancer Father     Lung ca age 57  . Diabetes Sister   . Cancer - Other Mother 57    breast    Review of Systems Negative, with the exception of above mentioned in HPI     Objective:   Physical Exam BP 152/85 mmHg  Pulse 68  Temp(Src) 99 F (37.2  C) (Temporal)  Resp 18  Ht 5' 9.5" (1.765 m)  Wt 178 lb (80.74 kg)  BMI 25.92 kg/m2  SpO2 98% Gen: Afebrile. No acute distress. Nontoxic in appearance, well-developed, well-nourished, Caucasian male.  CV: RRR  Chest: CTAB, no wheeze or crackles Abd: Soft. Flat. ND. Suprapubic tender to palpation BS present. No Masses palpated.   Neuro: Normal gait. PERLA. EOMi. Alert. Oriented x3   Assessment & Plan:  1. Dysuria/UTI - Treated with cipro BID x10 - POCT urinalysis dipstick --> + Nitrites and leuks - Urine culture - ciprofloxacin (CIPRO) 500 MG tablet; Take 1 tablet (500 mg total) by mouth 2 (two) times daily.  Dispense: 20 tablet; Refill: 0 - Pyridium prescribed  2. Elevated PSA - Repeat PSA today, patient encouraged to follow through with his urological appointment with a PSA greater than 14 on last check. He was advised that he could not have this appointment  set up by next week, to call our office and we will assist him with finding a new urologist. He would like a urologist near the [place] area, and his last urologist has retired. - Continue Cardura - Prostate symptoms score: 8 (moderate), with mostly satisfied quality of life. - PSA, total and free  Follow-up as needed

## 2014-11-14 NOTE — Patient Instructions (Signed)
Call us if you are unable to set up your Urology appointment within a week and will help with referral in your area. I have called in antibiotic and pyridium for you to take.

## 2014-11-14 NOTE — Progress Notes (Signed)
Pre visit review using our clinic review tool, if applicable. No additional management support is needed unless otherwise documented below in the visit note. 

## 2014-11-15 LAB — PSA, TOTAL AND FREE
PSA FREE: 1.64 ng/mL
PSA, Free Pct: 12 % — ABNORMAL LOW (ref >25)
PSA: 13.92 ng/mL — ABNORMAL HIGH (ref <=4.00)

## 2014-11-15 LAB — URINE CULTURE
Colony Count: NO GROWTH
Organism ID, Bacteria: NO GROWTH

## 2014-11-17 ENCOUNTER — Telehealth: Payer: Self-pay | Admitting: Family Medicine

## 2014-11-17 DIAGNOSIS — R300 Dysuria: Secondary | ICD-10-CM

## 2014-11-17 DIAGNOSIS — N400 Enlarged prostate without lower urinary tract symptoms: Secondary | ICD-10-CM

## 2014-11-17 DIAGNOSIS — R972 Elevated prostate specific antigen [PSA]: Secondary | ICD-10-CM

## 2014-11-17 NOTE — Telephone Encounter (Signed)
Patient is scheduling an appt with [facility] to see Dr Jeffie Pollock . He will stop by here for copy of his PSA results to take to his appt. Copy printed and placed at reception desk for patient.

## 2014-11-17 NOTE — Telephone Encounter (Signed)
Patient called back & asked that we send his urology referral for him to [name] at [facility] in [place]. It will be closer to his house.

## 2014-11-17 NOTE — Telephone Encounter (Signed)
Please call pt: - He did not have a UTI. His urine cultures are negative. For this reason and his elevated PSA he needs to establish with new urologist as we discussed in his appt. He needs to make certain he has a urology appointment scheduled. If he does not have a urology appt scheduled with new urologist I want to place a new referral to have it completed.  - His PSA was elevated, at 14.  Please advise on his appt status with uro

## 2014-11-21 ENCOUNTER — Other Ambulatory Visit: Payer: Self-pay | Admitting: Family Medicine

## 2014-11-21 MED ORDER — BENAZEPRIL HCL 40 MG PO TABS: ORAL_TABLET | ORAL | Status: DC

## 2014-11-25 DIAGNOSIS — R972 Elevated prostate specific antigen [PSA]: Secondary | ICD-10-CM | POA: Diagnosis not present

## 2014-11-25 DIAGNOSIS — F17200 Nicotine dependence, unspecified, uncomplicated: Secondary | ICD-10-CM | POA: Diagnosis not present

## 2014-11-25 DIAGNOSIS — R3129 Other microscopic hematuria: Secondary | ICD-10-CM | POA: Diagnosis not present

## 2014-11-26 ENCOUNTER — Encounter: Payer: Self-pay | Admitting: Family Medicine

## 2014-11-28 DIAGNOSIS — M1612 Unilateral primary osteoarthritis, left hip: Secondary | ICD-10-CM | POA: Diagnosis not present

## 2014-11-28 DIAGNOSIS — N400 Enlarged prostate without lower urinary tract symptoms: Secondary | ICD-10-CM | POA: Diagnosis not present

## 2014-11-28 DIAGNOSIS — N200 Calculus of kidney: Secondary | ICD-10-CM | POA: Diagnosis not present

## 2014-11-28 DIAGNOSIS — R3129 Other microscopic hematuria: Secondary | ICD-10-CM | POA: Diagnosis not present

## 2014-11-28 DIAGNOSIS — M47816 Spondylosis without myelopathy or radiculopathy, lumbar region: Secondary | ICD-10-CM | POA: Diagnosis not present

## 2014-11-28 DIAGNOSIS — N209 Urinary calculus, unspecified: Secondary | ICD-10-CM | POA: Diagnosis not present

## 2014-11-28 DIAGNOSIS — M5136 Other intervertebral disc degeneration, lumbar region: Secondary | ICD-10-CM | POA: Diagnosis not present

## 2014-11-28 DIAGNOSIS — J439 Emphysema, unspecified: Secondary | ICD-10-CM | POA: Diagnosis not present

## 2014-11-28 DIAGNOSIS — R339 Retention of urine, unspecified: Secondary | ICD-10-CM | POA: Diagnosis not present

## 2014-11-28 DIAGNOSIS — F17200 Nicotine dependence, unspecified, uncomplicated: Secondary | ICD-10-CM | POA: Diagnosis not present

## 2014-12-09 DIAGNOSIS — N200 Calculus of kidney: Secondary | ICD-10-CM | POA: Diagnosis not present

## 2014-12-09 DIAGNOSIS — F17200 Nicotine dependence, unspecified, uncomplicated: Secondary | ICD-10-CM | POA: Diagnosis not present

## 2014-12-09 DIAGNOSIS — R339 Retention of urine, unspecified: Secondary | ICD-10-CM | POA: Diagnosis not present

## 2014-12-09 DIAGNOSIS — R3129 Other microscopic hematuria: Secondary | ICD-10-CM | POA: Diagnosis not present

## 2014-12-11 ENCOUNTER — Encounter: Payer: Self-pay | Admitting: Family Medicine

## 2014-12-22 ENCOUNTER — Other Ambulatory Visit (INDEPENDENT_AMBULATORY_CARE_PROVIDER_SITE_OTHER): Payer: Medicare Other

## 2014-12-22 DIAGNOSIS — R972 Elevated prostate specific antigen [PSA]: Secondary | ICD-10-CM

## 2014-12-23 ENCOUNTER — Encounter: Payer: Self-pay | Admitting: Family Medicine

## 2014-12-23 LAB — PSA, TOTAL AND FREE
PSA, Free Pct: 12 % — ABNORMAL LOW (ref >25)
PSA, Free: 2.15 ng/mL
PSA: 17.82 ng/mL — ABNORMAL HIGH (ref <=4.00)

## 2014-12-30 DIAGNOSIS — N200 Calculus of kidney: Secondary | ICD-10-CM | POA: Diagnosis not present

## 2014-12-30 DIAGNOSIS — R3129 Other microscopic hematuria: Secondary | ICD-10-CM | POA: Diagnosis not present

## 2014-12-31 ENCOUNTER — Encounter: Payer: Self-pay | Admitting: Family Medicine

## 2015-01-19 ENCOUNTER — Ambulatory Visit (INDEPENDENT_AMBULATORY_CARE_PROVIDER_SITE_OTHER): Payer: Medicare Other | Admitting: Family Medicine

## 2015-01-19 ENCOUNTER — Encounter: Payer: Self-pay | Admitting: Family Medicine

## 2015-01-19 VITALS — BP 142/81 | HR 78 | Temp 98.0°F | Resp 16 | Wt 174.5 lb

## 2015-01-19 DIAGNOSIS — F419 Anxiety disorder, unspecified: Secondary | ICD-10-CM

## 2015-01-19 DIAGNOSIS — F17200 Nicotine dependence, unspecified, uncomplicated: Secondary | ICD-10-CM | POA: Diagnosis not present

## 2015-01-19 DIAGNOSIS — R972 Elevated prostate specific antigen [PSA]: Secondary | ICD-10-CM | POA: Diagnosis not present

## 2015-01-19 DIAGNOSIS — I10 Essential (primary) hypertension: Secondary | ICD-10-CM

## 2015-01-19 NOTE — Progress Notes (Signed)
OFFICE VISIT  01/19/2015   CC:  Chief Complaint  Patient presents with  . Follow-up    Pt is not fasting.    HPI:    Patient is a 69 y.o. Caucasian male who presents for 4 mo f/u HTN, anxiety, BPH with elevated PSA, tobacco dependence with COPD changes noted on CT in fall of this year.  Home bp 150/90 avg when checks in AM, some later in the day 120s/80s.  If diast gets around 70 he reports feeling week.  Some days this makes him take only a 1/2 of HCTZ. Anxiety well controlled: takes one xanax per day.  Recent re-evaluation for hx of elevated PSA and BPH: see PMH below. Plan is for prostate bx 02/04/14 with [name].  Tobacco: he has cut back significantly but is making a new year's resolution to completely quit.  Plans on trying to retire next month, says new mgmt has him under too much stress.   Past Medical History  Diagnosis Date  . Hypertension   . Anxiety   . Atrial fibrillation ([place])     Limited episode, emergency room, June, 2013, spontaneous conversion to sinus rhythm  . Ejection fraction     EF 65%, echo, 2008  . Nephrolithiasis     11 mm stone on R, 2 mm stone on L, ureters clear  . 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 (Dr. Maryland Pink retired then Dr. Elnoria Howard retired)--latest PSA 12.34 on 08/16/12 --Dr. Elnoria Howard wanted to see him again if his PSA got over 14.  PSA 03/01/13 was12 and has been < 14 since that time, including 01/2014.  Then PSA 14.95 08/2014 so pt referred to new urol ([name]) at [facility]  . Diverticulitis   . GERD (gastroesophageal reflux disease)   . Tobacco dependence   . BPH (benign prostatic hypertrophy) with urinary obstruction     Nocturia is his primary symptom.  Acute urinary retention occurred when he got CT with contrast fall 2016 ([name]).  . Has received pneumococcal vaccination   . COPD (chronic obstructive pulmonary disease) ([place]) fall 2016     Bullous changes noted on lower lung images of CT abd done by urology  . Microscopic hematuria Fall 2016    CT nl except nonobstructing stones.  Cystoscopy normal 12/30/14 ([name])    Past Surgical History  Procedure Laterality Date  . Lithotripsy  2004    Dr. [place] Pink in [place].  . Inguinal hernia repair  2003 and 2004    Both sides.  . Colonoscopy  approx 2011 per pt    Done by surgeon in [place] after a bout of diverticulitis; normal per pt report--was told to repeat in 10 yrs.  . Aortic ultrasound  07/2012    NO AAA  . Transthoracic echocardiogram  03/22/2006    EF 65%, normal valves, no wall motion abnormalties, LA size normal    Outpatient Prescriptions Prior to Visit  Medication Sig Dispense Refill  . ALPRAZolam (XANAX) 0.25 MG tablet 1 tab po qhs prn 30 tablet 5  . aspirin EC 81 MG tablet Take 1 tablet (81 mg total) by mouth daily. 30 tablet 0  . benazepril (LOTENSIN) 40 MG tablet 1/2 tab po qAM and 1 tab po qPM 135 tablet 2  . Cholecalciferol (VITAMIN D-3 PO) Take by mouth daily.    . fish oil-omega-3 fatty acids 1000  MG capsule Take 2 g by mouth daily.    . hydrochlorothiazide (HYDRODIURIL) 25 MG tablet Take 1 tablet (25 mg total) by mouth daily. (Patient taking differently: Take 25 mg by mouth daily. Pt takes 1/2 qd) 30 tablet 10  . ciprofloxacin (CIPRO) 500 MG tablet Take 1 tablet (500 mg total) by mouth 2 (two) times daily. (Patient not taking: Reported on 01/19/2015) 20 tablet 0  . doxazosin (CARDURA) 2 MG tablet TAKE ONE TABLET BY MOUTH AT BEDTIME (Patient not taking: Reported on 01/19/2015) 30 tablet 3  . fexofenadine (ALLEGRA) 60 MG tablet Take 1 tablet (60 mg total) by mouth 2 (two) times daily. (Patient not taking: Reported on 01/19/2015) 30 tablet 0  . phenazopyridine (PYRIDIUM) 100 MG tablet Take 1 tablet (100 mg total) by mouth 3 (three) times daily as needed for pain. (Patient not taking: Reported on 01/19/2015) 10 tablet 0   No facility-administered medications  prior to visit.    Allergies  Allergen Reactions  . Amlodipine     LE swelling    ROS As per HPI  PE: Blood pressure 142/81, pulse 78, temperature 98 F (36.7 C), temperature source Oral, resp. rate 16, weight 174 lb 8 oz (79.153 kg), SpO2 99 %. Gen: Alert, well appearing.  Patient is oriented to person, place, time, and situation. CV: RRR, no m/r/g.   LUNGS: CTA bilat, nonlabored resps, good aeration in all lung fields. EXT: no clubbing, cyanosis, or edema.   LABS:  Lab Results  Component Value Date   TSH 0.96 08/16/2012   Lab Results  Component Value Date   WBC 6.4 08/16/2012   HGB 15.3 08/16/2012   HCT 45.3 08/16/2012   MCV 93.9 08/16/2012   PLT 178.0 08/16/2012   Lab Results  Component Value Date   CREATININE 0.88 08/28/2014   BUN 19 08/28/2014   NA 143 08/28/2014   K 4.4 08/28/2014   CL 108 08/28/2014   CO2 28 08/28/2014   Lab Results  Component Value Date   ALT 17 08/09/2013   AST 23 08/09/2013   ALKPHOS 76 08/09/2013   BILITOT 0.8 08/09/2013   Lab Results  Component Value Date   CHOL 159 08/09/2013   Lab Results  Component Value Date   HDL 35.30* 08/09/2013   Lab Results  Component Value Date   LDLCALC 109* 08/09/2013   Lab Results  Component Value Date   TRIG 72.0 08/09/2013   Lab Results  Component Value Date   CHOLHDL 5 08/09/2013   Lab Results  Component Value Date   PSA 17.82* 12/22/2014   PSA 13.92* 11/14/2014   PSA 14.95* 08/28/2014    IMPRESSION AND PLAN:  1) Essential HTN: The current medical regimen is effective;  continue present plan and medications. Lytes/cr good 08/2014.  2) Anxiety: stable.  3) Tobacco dependence: cutting back, encouraged pt to totally quit and he is motivated to do this now.  4) BPH with elevated PSA: getting prostate bx early 02/2015 via [name] in [place], [place].  An After Visit Summary was printed and given to the patient.  FOLLOW UP: Return in about 6 months (around 07/20/2015) for fasting  routine chronic illness f/u.

## 2015-01-19 NOTE — Progress Notes (Signed)
Pre visit review using our clinic review tool, if applicable. No additional management support is needed unless otherwise documented below in the visit note. 

## 2015-02-01 DIAGNOSIS — C61 Malignant neoplasm of prostate: Secondary | ICD-10-CM

## 2015-02-01 HISTORY — PX: LAPAROSCOPY: SHX197

## 2015-02-01 HISTORY — DX: Malignant neoplasm of prostate: C61

## 2015-02-04 ENCOUNTER — Other Ambulatory Visit: Payer: Self-pay | Admitting: Family Medicine

## 2015-02-04 MED ORDER — ALPRAZOLAM 0.25 MG PO TABS: ORAL_TABLET | ORAL | Status: DC

## 2015-02-04 NOTE — Telephone Encounter (Signed)
Pt called requesting refill of xanax.  Pt states that he takes them so rare that the Rx is expired.  Pt has biopsy tomorrow and would like to get refill asap.  Please advise.

## 2015-02-05 DIAGNOSIS — C61 Malignant neoplasm of prostate: Secondary | ICD-10-CM | POA: Diagnosis not present

## 2015-02-05 DIAGNOSIS — R972 Elevated prostate specific antigen [PSA]: Secondary | ICD-10-CM | POA: Diagnosis not present

## 2015-02-05 HISTORY — PX: PROSTATE BIOPSY: SHX241

## 2015-02-09 ENCOUNTER — Encounter: Payer: Self-pay | Admitting: Family Medicine

## 2015-02-11 ENCOUNTER — Encounter: Payer: Self-pay | Admitting: Family Medicine

## 2015-02-11 ENCOUNTER — Ambulatory Visit (INDEPENDENT_AMBULATORY_CARE_PROVIDER_SITE_OTHER): Payer: Medicare Other | Admitting: Family Medicine

## 2015-02-11 VITALS — BP 130/82 | HR 75 | Temp 97.8°F | Resp 16 | Ht 69.5 in | Wt 171.8 lb

## 2015-02-11 DIAGNOSIS — K5792 Diverticulitis of intestine, part unspecified, without perforation or abscess without bleeding: Secondary | ICD-10-CM | POA: Diagnosis not present

## 2015-02-11 MED ORDER — CIPROFLOXACIN HCL 500 MG PO TABS: 500.0000 mg | ORAL_TABLET | Freq: Two times a day (BID) | ORAL | Status: DC

## 2015-02-11 MED ORDER — METRONIDAZOLE 500 MG PO TABS: 500.0000 mg | ORAL_TABLET | Freq: Three times a day (TID) | ORAL | Status: DC

## 2015-02-11 NOTE — Progress Notes (Signed)
Pre visit review using our clinic review tool, if applicable. No additional management support is needed unless otherwise documented below in the visit note. 

## 2015-02-11 NOTE — Progress Notes (Signed)
OFFICE VISIT  02/11/2015   CC:  Chief Complaint  Patient presents with  . Abdominal Pain    Lower abdomen x 3 days   HPI:    Patient is a 70 y.o. Caucasian male who presents for lower abd pain. Onset 4 days ago, diffuse lower abd pains --worst area seems to be LLQ, significantly improved after he has a BM. BMs are w/out blood or pus and are not more frequent than qd.  Feels lots of lower abd gassiness.  No rectal pain.  No dysuria, urinary urgency/frequency, or hematuria.   He recalls eating peanuts last week.  Says this feels like past episode of diverticulitis "before it got bad". No fever or nausea.    Past Medical History  Diagnosis Date  . Hypertension   . Anxiety   . Atrial fibrillation ([place])     Limited episode, emergency room, June, 2013, spontaneous conversion to sinus rhythm  . Ejection fraction     EF 65%, echo, 2008  . Nephrolithiasis     11 mm stone on R, 2 mm stone on L, ureters clear  . 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 (Dr. Maryland Pink retired then Dr. Elnoria Howard retired)--latest PSA 12.34 on 08/16/12 --Dr. Elnoria Howard wanted to see him again if his PSA got over 14.  PSA 03/01/13 was12 and has been < 14 since that time, including 01/2014.  Then PSA 14.95 08/2014 so pt referred to new urol ([name]) at [facility].   . Diverticulitis   . GERD (gastroesophageal reflux disease)   . Tobacco dependence   . BPH (benign prostatic hypertrophy) with urinary obstruction     Nocturia is his primary symptom.  Acute urinary retention occurred when he got CT with contrast fall 2016 ([name]).  . Has received pneumococcal vaccination   . COPD (chronic obstructive pulmonary disease) ([place]) fall 2016    Bullous changes noted on lower lung images of CT abd done by urology  . Microscopic hematuria Fall 2016    CT nl except nonobstructing stones.  Cystoscopy normal 12/30/14 ([name])    Past  Surgical History  Procedure Laterality Date  . Lithotripsy  2004    Dr. [place] Pink in [place].  . Inguinal hernia repair  2003 and 2004    Both sides.  . Colonoscopy  approx 2011 per pt    Done by surgeon in [place] after a bout of diverticulitis; normal per pt report--was told to repeat in 10 yrs.  . Aortic ultrasound  07/2012    NO AAA  . Transthoracic echocardiogram  03/22/2006    EF 65%, normal valves, no wall motion abnormalties, LA size normal  . Prostate biopsy  02/05/15    [name] at [facility]---path pending as of 02/09/15    Outpatient Prescriptions Prior to Visit  Medication Sig Dispense Refill  . ALPRAZolam (XANAX) 0.25 MG tablet 1 tab po qhs prn 30 tablet 2  . aspirin EC 81 MG tablet Take 1 tablet (81 mg total) by mouth daily. 30 tablet 0  . benazepril (LOTENSIN) 40 MG tablet 1/2 tab po qAM and 1 tab po qPM 135 tablet 2  . Cholecalciferol (VITAMIN D-3 PO) Take by mouth daily.    . fish oil-omega-3 fatty acids 1000 MG capsule Take 2 g by mouth daily.    . hydrochlorothiazide (HYDRODIURIL) 25 MG tablet Take 1 tablet (25  mg total) by mouth daily. (Patient taking differently: Take 25 mg by mouth daily. Pt takes 1/2 qd) 30 tablet 10  . tamsulosin (FLOMAX) 0.4 MG CAPS capsule Take 1 capsule by mouth daily.     No facility-administered medications prior to visit.    Allergies  Allergen Reactions  . Amlodipine     LE swelling    ROS As per HPI  PE: Blood pressure 130/82, pulse 75, temperature 97.8 F (36.6 C), temperature source Oral, resp. rate 16, height 5' 9.5" (1.765 m), weight 171 lb 12 oz (77.905 kg), SpO2 97 %. Gen: Alert, well appearing.  Patient is oriented to person, place, time, and situation. CV: RRR, no m/r/g.   LUNGS: CTA bilat, nonlabored resps, good aeration in all lung fields. ABD: soft, nondistended, BS normal.  No mass or bruit.  He has LLQ TTP that is mild.  No guarding or rebound tenderness or mass. EXT: no clubbing, cyanosis, or edema.    LABS:  none  IMPRESSION AND PLAN:  Acute diverticulitis. Cipro 500 mg bid, flagyl 500 mg tid: both for two week course. Dietary mod discussed. Signs/symptoms to call or return for were reviewed and pt expressed understanding.  An After Visit Summary was printed and given to the patient.  FOLLOW UP: Return if symptoms worsen or fail to improve.

## 2015-02-19 DIAGNOSIS — R972 Elevated prostate specific antigen [PSA]: Secondary | ICD-10-CM | POA: Diagnosis not present

## 2015-02-19 DIAGNOSIS — C61 Malignant neoplasm of prostate: Secondary | ICD-10-CM | POA: Diagnosis not present

## 2015-02-23 DIAGNOSIS — I708 Atherosclerosis of other arteries: Secondary | ICD-10-CM | POA: Diagnosis not present

## 2015-02-23 DIAGNOSIS — C61 Malignant neoplasm of prostate: Secondary | ICD-10-CM | POA: Diagnosis not present

## 2015-02-23 DIAGNOSIS — K5730 Diverticulosis of large intestine without perforation or abscess without bleeding: Secondary | ICD-10-CM | POA: Diagnosis not present

## 2015-02-23 DIAGNOSIS — Z9889 Other specified postprocedural states: Secondary | ICD-10-CM | POA: Diagnosis not present

## 2015-02-23 DIAGNOSIS — N400 Enlarged prostate without lower urinary tract symptoms: Secondary | ICD-10-CM | POA: Diagnosis not present

## 2015-02-23 DIAGNOSIS — R972 Elevated prostate specific antigen [PSA]: Secondary | ICD-10-CM | POA: Diagnosis not present

## 2015-02-28 ENCOUNTER — Encounter: Payer: Self-pay | Admitting: Family Medicine

## 2015-03-05 DIAGNOSIS — C61 Malignant neoplasm of prostate: Secondary | ICD-10-CM | POA: Diagnosis not present

## 2015-03-05 DIAGNOSIS — R972 Elevated prostate specific antigen [PSA]: Secondary | ICD-10-CM | POA: Diagnosis not present

## 2015-03-12 ENCOUNTER — Encounter: Payer: Self-pay | Admitting: Family Medicine

## 2015-03-24 DIAGNOSIS — C61 Malignant neoplasm of prostate: Secondary | ICD-10-CM | POA: Diagnosis not present

## 2015-03-24 DIAGNOSIS — Z0000 Encounter for general adult medical examination without abnormal findings: Secondary | ICD-10-CM | POA: Diagnosis not present

## 2015-03-25 ENCOUNTER — Other Ambulatory Visit: Payer: Self-pay | Admitting: Urology

## 2015-03-29 ENCOUNTER — Encounter: Payer: Self-pay | Admitting: Family Medicine

## 2015-04-06 DIAGNOSIS — M6281 Muscle weakness (generalized): Secondary | ICD-10-CM | POA: Diagnosis not present

## 2015-04-06 DIAGNOSIS — C61 Malignant neoplasm of prostate: Secondary | ICD-10-CM | POA: Diagnosis not present

## 2015-04-22 DIAGNOSIS — C61 Malignant neoplasm of prostate: Secondary | ICD-10-CM | POA: Diagnosis not present

## 2015-04-22 DIAGNOSIS — M6281 Muscle weakness (generalized): Secondary | ICD-10-CM | POA: Diagnosis not present

## 2015-04-27 ENCOUNTER — Ambulatory Visit (HOSPITAL_COMMUNITY)
Admission: RE | Admit: 2015-04-27 | Discharge: 2015-04-27 | Disposition: A | Discharge status: Home / Self Care | Payer: Medicare Other | Source: Ambulatory Visit | Attending: Urology | Admitting: Urology

## 2015-04-27 ENCOUNTER — Encounter (HOSPITAL_COMMUNITY): Payer: Self-pay

## 2015-04-27 ENCOUNTER — Encounter (HOSPITAL_COMMUNITY)
Admission: RE | Admit: 2015-04-27 | Discharge: 2015-04-27 | Disposition: A | Discharge status: Home / Self Care | Payer: Medicare Other | Source: Ambulatory Visit | Attending: Urology | Admitting: Urology

## 2015-04-27 DIAGNOSIS — J449 Chronic obstructive pulmonary disease, unspecified: Secondary | ICD-10-CM | POA: Insufficient documentation

## 2015-04-27 DIAGNOSIS — C61 Malignant neoplasm of prostate: Secondary | ICD-10-CM

## 2015-04-27 DIAGNOSIS — Z01818 Encounter for other preprocedural examination: Secondary | ICD-10-CM | POA: Insufficient documentation

## 2015-04-27 LAB — CBC
HCT: 47.4 % (ref 39.0–52.0)
Hemoglobin: 16.3 g/dL (ref 13.0–17.0)
MCH: 31.8 pg (ref 26.0–34.0)
MCHC: 34.4 g/dL (ref 30.0–36.0)
MCV: 92.6 fL (ref 78.0–100.0)
PLATELETS: 161 10*3/uL (ref 150–400)
RBC: 5.12 MIL/uL (ref 4.22–5.81)
RDW: 13.4 % (ref 11.5–15.5)
WBC: 6.2 10*3/uL (ref 4.0–10.5)

## 2015-04-27 LAB — BASIC METABOLIC PANEL
Anion gap: 7 (ref 5–15)
BUN: 17 mg/dL (ref 6–20)
CALCIUM: 9.5 mg/dL (ref 8.9–10.3)
CHLORIDE: 108 mmol/L (ref 101–111)
CO2: 26 mmol/L (ref 22–32)
CREATININE: 0.83 mg/dL (ref 0.61–1.24)
Glucose, Bld: 86 mg/dL (ref 65–99)
Potassium: 4.5 mmol/L (ref 3.5–5.1)
SODIUM: 141 mmol/L (ref 135–145)

## 2015-04-27 LAB — ABO/RH: ABO/RH(D): A POS

## 2015-04-27 NOTE — Patient Instructions (Addendum)
Daniel [name].  04/27/2015   Your procedure is scheduled on: 04-30-15   Report to [facility] Main  Entrance take [facility]  elevators to 3rd floor to  [name] at   [place] AM.  Call this number if you have problems the morning of surgery [phone]   Remember: ONLY 1 PERSON MAY GO WITH YOU TO SHORT STAY TO GET  READY MORNING OF [place].  Do not eat food or drink liquids :After Midnight.     Take these medicines the morning of surgery with A SIP OF WATER: NONE (follow bowel prep instructions as per MD) DO NOT TAKE ANY DIABETIC MEDICATIONS DAY OF YOUR SURGERY                               You may not have any metal on your body including hair pins and              piercings  Do not wear jewelry, make-up, lotions, powders or perfumes, deodorant             Do not wear nail polish.  Do not shave  48 hours prior to surgery.              Men may shave face and neck.   Do not bring valuables to the hospital. [place].  Contacts, dentures or bridgework may not be worn into surgery.  Leave suitcase in the car. After surgery it may be brought to your room.     Patients discharged the day of surgery will not be allowed to drive home.  Name and phone number of your driver: son. Daniel [name] h/ son -Daniel [name] [phone] cell  Special Instructions: N/A              Please read over the following fact sheets you were given: _____________________________________________________________________             [facility] - Preparing for Surgery Before surgery, you can play an important role.  Because skin is not sterile, your skin needs to be as free of germs as possible.  You can reduce the number of germs on your skin by washing with CHG (chlorahexidine gluconate) soap before surgery.  CHG is an antiseptic cleaner which kills germs and bonds with the skin to continue killing germs even after  washing. Please DO NOT use if you have an allergy to CHG or antibacterial soaps.  If your skin becomes reddened/irritated stop using the CHG and inform your nurse when you arrive at Short Stay. Do not shave (including legs and underarms) for at least 48 hours prior to the first CHG shower.  You may shave your face/neck. Please follow these instructions carefully:  1.  Shower with CHG Soap the night before surgery and the  morning of Surgery.  2.  If you choose to wash your hair, wash your hair first as usual with your  normal  shampoo.  3.  After you shampoo, rinse your hair and body thoroughly to remove the  shampoo.                           4.  Use CHG as you would any other liquid soap.  You can apply chg directly  to the skin and wash                       Gently with a scrungie or clean washcloth.  5.  Apply the CHG Soap to your body ONLY FROM THE NECK DOWN.   Do not use on face/ open                           Wound or open sores. Avoid contact with eyes, ears mouth and genitals (private parts).                       Wash face,  Genitals (private parts) with your normal soap.             6.  Wash thoroughly, paying special attention to the area where your surgery  will be performed.  7.  Thoroughly rinse your body with warm water from the neck down.  8.  DO NOT shower/wash with your normal soap after using and rinsing off  the CHG Soap.                9.  Pat yourself dry with a clean towel.            10.  Wear clean pajamas.            11.  Place clean sheets on your bed the night of your first shower and do not  sleep with pets. Day of Surgery : Do not apply any lotions/deodorants the morning of surgery.  Please wear clean clothes to the hospital/surgery center.  FAILURE TO FOLLOW THESE INSTRUCTIONS MAY RESULT IN THE CANCELLATION OF YOUR SURGERY PATIENT SIGNATURE_________________________________  NURSE  SIGNATURE__________________________________  ________________________________________________________________________   Adam Phenix  An incentive spirometer is a tool that can help keep your lungs clear and active. This tool measures how well you are filling your lungs with each breath. Taking long deep breaths may help reverse or decrease the chance of developing breathing (pulmonary) problems (especially infection) following:  A long period of time when you are unable to move or be active. BEFORE THE PROCEDURE   If the spirometer includes an indicator to show your best effort, your nurse or respiratory therapist will set it to a desired goal.  If possible, sit up straight or lean slightly forward. Try not to slouch.  Hold the incentive spirometer in an upright position. INSTRUCTIONS FOR USE  1. Sit on the edge of your bed if possible, or sit up as far as you can in bed or on a chair. 2. Hold the incentive spirometer in an upright position. 3. Breathe out normally. 4. Place the mouthpiece in your mouth and seal your lips tightly around it. 5. Breathe in slowly and as deeply as possible, raising the piston or the ball toward the top of the column. 6. Hold your breath for 3-5 seconds or for as long as possible. Allow the piston or ball to fall to the bottom of the column. 7. Remove the mouthpiece from your mouth and breathe out normally. 8. Rest for a few seconds and repeat Steps 1 through 7 at least 10 times every 1-2 hours when you are awake. Take your time and take a few normal breaths between deep breaths. 9. The spirometer may include an indicator to show your best  effort. Use the indicator as a goal to work toward during each repetition. 10. After each set of 10 deep breaths, practice coughing to be sure your lungs are clear. If you have an incision (the cut made at the time of surgery), support your incision when coughing by placing a pillow or rolled up towels firmly  against it. Once you are able to get out of bed, walk around indoors and cough well. You may stop using the incentive spirometer when instructed by your caregiver.  RISKS AND COMPLICATIONS  Take your time so you do not get dizzy or light-headed.  If you are in pain, you may need to take or ask for pain medication before doing incentive spirometry. It is harder to take a deep breath if you are having pain. AFTER USE  Rest and breathe slowly and easily.  It can be helpful to keep track of a log of your progress. Your caregiver can provide you with a simple table to help with this. If you are using the spirometer at home, follow these instructions: [place] IF:   You are having difficultly using the spirometer.  You have trouble using the spirometer as often as instructed.  Your pain medication is not giving enough relief while using the spirometer.  You develop fever of 100.5 F (38.1 C) or higher. SEEK IMMEDIATE MEDICAL CARE IF:   You cough up bloody sputum that had not been present before.  You develop fever of 102 F (38.9 C) or greater.  You develop worsening pain at or near the incision site. MAKE SURE YOU:   Understand these instructions.  Will watch your condition.  Will get help right away if you are not doing well or get worse. Document Released: 05/30/2006 Document Revised: 04/11/2011 Document Reviewed: 07/31/2006 [facility] Patient Information 2014 [place], [place].   ________________________________________________________________________

## 2015-04-27 NOTE — Pre-Procedure Instructions (Signed)
EKG 8'16 Epic(Sinus bradycardia)- ok to use per Dr. Deatra Canter. CXR done today per order of MD.

## 2015-04-29 NOTE — H&P (Signed)
Chief Complaint Prostate Cancer     History of Present Illness Daniel [name] is a 70 year old gentleman with a long standing history of an elevated PSA which has been around 12 for at least a few years. He had undergone a prostate biopsy years ago that was reportedly benign per the patient. His PSA further increased to 17.82 last fall. This prompted a repeat TRUS biopsy by [name] on 02/16/15 that demonstrated 4 out of 17 biopsy cores positive for Gleason 3+3=6 adenocarcinoma of the prostate. He has no family history of prostate cancer. He underwent a CT of the pelvis on 02/23/15 that was unremarkable for metastatic disease to the pelvis. He did develop urinary retention following a CT scan requiring catheter placement. He has since been on tamsulosin was able to pass a voiding trial. He currently subjectively feels that he is voiding well. He denies prior episodes of urinary retention or significant lower urinary tract symptoms.    His medical history is significant for hypertension and 50 pack year smoking history.    TNM stage: cT1c N0 Mx  PSA: 17.82  Gleason score: 3+3=6  Biopsy (02/16/15 - read by Dr. Mark Martinique, Acc# 71.8 cc): 4/12 cores -- L apex (10%), L mid (40%), R apex (40%, 40%)  Prostate volume: 71.8 cc  PSAD: 0.25    Nomogram  OC disease: 46%  EPE: 51%  SVI: 1%  LNI: 1%  PFS (surgery): 88% at 5 years, 79% at 10 years     Urinary function: He has minimal symptoms currently. IPSS is 3. He is taking tamsulosin 0.4 mg and feels that this has provided him some benefit. He does have a recent episode of urinary retention as described above.  Erectile function: He does have preserved erectile function. He has mild dysfunction and has not required treatment and is able to reliably get erections adequate for intercourse. SHIM score is 23.   Past Medical History Problems  1. History of Atherosclerosis (I70.90) 2. History of esophageal reflux (Z87.19) 3. History of  hypertension (Z86.79)  Surgical History Problems  1. History of Inguinal Hernia Repair  Current Meds 1. ALPRAZolam 0.25 MG Oral Tablet;  Therapy: (Recorded:17Feb2017) to Recorded 2. Benazepril HCl - 40 MG Oral Tablet;  Therapy: (Recorded:17Feb2017) to Recorded 3. Fish Oil Concentrate 1000 MG Oral Capsule;  Therapy: (Recorded:17Feb2017) to Recorded 4. HydroCHLOROthiazide 25 MG Oral Tablet;  Therapy: (Recorded:17Feb2017) to Recorded 5. Tamsulosin HCl - 0.4 MG Oral Capsule;  Therapy: (Recorded:17Feb2017) to Recorded 6. Vitamin D3 400 UNIT Oral Capsule;  Therapy: (Recorded:17Feb2017) to Recorded  Allergies He actually has no true allergy to iodine contrast but rather had his episode of urinary retention around the time of the CT scan.   Family History Problems  1. Family history of BPH (benign prostatic hyperplasia) : Father 2. Family history of hypertension (Z82.49) : Mother  Social History Problems    Denied: History of Alcohol use   Light tobacco smoker (F17.200)   Married   Occupation   maintenence  Review of Systems Genitourinary, constitutional, skin, eye, otolaryngeal, hematologic/lymphatic, cardiovascular, pulmonary, endocrine, musculoskeletal, gastrointestinal, neurological and psychiatric system(s) were reviewed and pertinent findings if present are noted and are otherwise negative.  Hematologic/Lymphatic: a tendency to easily bruise.    Vitals  Height: 5 ft 11 in Weight: 172 lb  BMI Calculated: 23.99 BSA Calculated: 1.98  Physical Exam Constitutional: Well nourished and well developed . No acute distress.  ENT:. The ears and nose are normal in appearance.  Neck:  The appearance of the neck is normal and no neck mass is present.  Pulmonary: No respiratory distress, normal respiratory rhythm and effort and clear bilateral breath sounds.  Cardiovascular: Heart rate and rhythm are normal . No peripheral edema.  Abdomen: left inguinal, right inguinal  incision site(s) well healed. The abdomen is soft and nontender. No masses are palpated. No CVA tenderness. No hernias are palpable. No hepatosplenomegaly noted.   Assessment Assessed  1. Prostate cancer (C61)   Discussion/Summary 1. Prostate cancer: He will undergo a bilateral nerve sparing robot-assisted laparoscopic radical prostatectomy and pelvic lymphadenectomy. I discussed the potential benefits and risks of the procedure, side effects of the proposed treatment, the likelihood of the patient achieving the goals of the procedure, and any potential problems that might occur during the procedure or recuperation.

## 2015-04-30 ENCOUNTER — Inpatient Hospital Stay (HOSPITAL_COMMUNITY): Payer: Medicare Other | Admitting: Certified Registered"

## 2015-04-30 ENCOUNTER — Inpatient Hospital Stay (HOSPITAL_COMMUNITY)
Admission: RE | Admit: 2015-04-30 | Discharge: 2015-05-01 | DRG: 708 | Disposition: A | Discharge status: Home / Self Care | Payer: Medicare Other | Source: Ambulatory Visit | Attending: Urology | Admitting: Urology

## 2015-04-30 ENCOUNTER — Encounter (HOSPITAL_COMMUNITY): Payer: Self-pay | Admitting: *Deleted

## 2015-04-30 ENCOUNTER — Encounter (HOSPITAL_COMMUNITY)
Admission: RE | Disposition: A | Discharge status: Home / Self Care | Payer: Self-pay | Source: Ambulatory Visit | Attending: Urology

## 2015-04-30 DIAGNOSIS — I10 Essential (primary) hypertension: Secondary | ICD-10-CM | POA: Diagnosis not present

## 2015-04-30 DIAGNOSIS — F17210 Nicotine dependence, cigarettes, uncomplicated: Secondary | ICD-10-CM | POA: Diagnosis present

## 2015-04-30 DIAGNOSIS — C61 Malignant neoplasm of prostate: Principal | ICD-10-CM | POA: Diagnosis present

## 2015-04-30 DIAGNOSIS — K219 Gastro-esophageal reflux disease without esophagitis: Secondary | ICD-10-CM | POA: Diagnosis present

## 2015-04-30 DIAGNOSIS — N189 Chronic kidney disease, unspecified: Secondary | ICD-10-CM | POA: Diagnosis not present

## 2015-04-30 DIAGNOSIS — R339 Retention of urine, unspecified: Secondary | ICD-10-CM | POA: Diagnosis not present

## 2015-04-30 DIAGNOSIS — I129 Hypertensive chronic kidney disease with stage 1 through stage 4 chronic kidney disease, or unspecified chronic kidney disease: Secondary | ICD-10-CM | POA: Diagnosis not present

## 2015-04-30 DIAGNOSIS — Z8249 Family history of ischemic heart disease and other diseases of the circulatory system: Secondary | ICD-10-CM | POA: Diagnosis not present

## 2015-04-30 DIAGNOSIS — J449 Chronic obstructive pulmonary disease, unspecified: Secondary | ICD-10-CM | POA: Diagnosis not present

## 2015-04-30 HISTORY — PX: ROBOT ASSISTED LAPAROSCOPIC RADICAL PROSTATECTOMY: SHX5141

## 2015-04-30 HISTORY — PX: LYMPHADENECTOMY: SHX5960

## 2015-04-30 LAB — TYPE AND SCREEN
ABO/RH(D): A POS
Antibody Screen: NEGATIVE

## 2015-04-30 LAB — HEMOGLOBIN AND HEMATOCRIT, BLOOD
HEMATOCRIT: 44.6 % (ref 39.0–52.0)
HEMOGLOBIN: 15.2 g/dL (ref 13.0–17.0)

## 2015-04-30 SURGERY — XI ROBOTIC ASSISTED LAPAROSCOPIC RADICAL PROSTATECTOMY LEVEL 2
Anesthesia: General

## 2015-04-30 MED ORDER — ROCURONIUM BROMIDE 100 MG/10ML IV SOLN
INTRAVENOUS | Status: DC | PRN
  Administered 2015-04-30 (×2): 10 mg via INTRAVENOUS
  Administered 2015-04-30: 35 mg via INTRAVENOUS
  Administered 2015-04-30 (×2): 5 mg via INTRAVENOUS

## 2015-04-30 MED ORDER — MIDAZOLAM HCL 2 MG/2ML IJ SOLN
INTRAMUSCULAR | Status: AC
  Filled 2015-04-30: qty 2

## 2015-04-30 MED ORDER — BUPIVACAINE-EPINEPHRINE 0.25% -1:200000 IJ SOLN
INTRAMUSCULAR | Status: DC | PRN
  Administered 2015-04-30: 30 mL

## 2015-04-30 MED ORDER — HYDROCHLOROTHIAZIDE 25 MG PO TABS
25.0000 mg | ORAL_TABLET | Freq: Every day | ORAL | Status: DC
  Administered 2015-04-30 – 2015-05-01 (×2): 25 mg via ORAL
  Filled 2015-04-30 (×2): qty 1

## 2015-04-30 MED ORDER — SUCCINYLCHOLINE CHLORIDE 20 MG/ML IJ SOLN
INTRAMUSCULAR | Status: DC | PRN
  Administered 2015-04-30: 100 mg via INTRAVENOUS

## 2015-04-30 MED ORDER — LACTATED RINGERS IV SOLN
INTRAVENOUS | Status: DC | PRN
  Administered 2015-04-30 (×2): via INTRAVENOUS

## 2015-04-30 MED ORDER — ONDANSETRON HCL 4 MG/2ML IJ SOLN
INTRAMUSCULAR | Status: DC | PRN
  Administered 2015-04-30: 4 mg via INTRAVENOUS

## 2015-04-30 MED ORDER — HYDROCODONE-ACETAMINOPHEN 5-325 MG PO TABS: 1.0000 | ORAL_TABLET | Freq: Four times a day (QID) | ORAL | Status: DC | PRN

## 2015-04-30 MED ORDER — SUGAMMADEX SODIUM 200 MG/2ML IV SOLN
INTRAVENOUS | Status: AC
  Filled 2015-04-30: qty 2

## 2015-04-30 MED ORDER — CEFAZOLIN SODIUM-DEXTROSE 2-4 GM/100ML-% IV SOLN
2.0000 g | INTRAVENOUS | Status: AC
  Administered 2015-04-30: 2 g via INTRAVENOUS

## 2015-04-30 MED ORDER — SUGAMMADEX SODIUM 200 MG/2ML IV SOLN
INTRAVENOUS | Status: DC | PRN
  Administered 2015-04-30: 175 mg via INTRAVENOUS

## 2015-04-30 MED ORDER — MORPHINE SULFATE (PF) 2 MG/ML IV SOLN: 2.0000 mg | INTRAVENOUS | Status: DC | PRN

## 2015-04-30 MED ORDER — HEPARIN SODIUM (PORCINE) 1000 UNIT/ML IJ SOLN
INTRAMUSCULAR | Status: AC
  Filled 2015-04-30: qty 1

## 2015-04-30 MED ORDER — STERILE WATER FOR IRRIGATION IR SOLN
Status: DC | PRN
  Administered 2015-04-30: 1000 mL

## 2015-04-30 MED ORDER — CEFAZOLIN SODIUM-DEXTROSE 2-3 GM-% IV SOLR
INTRAVENOUS | Status: AC
  Filled 2015-04-30: qty 50

## 2015-04-30 MED ORDER — HYDRALAZINE HCL 20 MG/ML IJ SOLN: 10.0000 mg | INTRAMUSCULAR | Status: DC | PRN

## 2015-04-30 MED ORDER — HYDROMORPHONE HCL 1 MG/ML IJ SOLN
INTRAMUSCULAR | Status: DC | PRN
  Administered 2015-04-30 (×4): 0.5 mg via INTRAVENOUS

## 2015-04-30 MED ORDER — EPHEDRINE SULFATE 50 MG/ML IJ SOLN
INTRAMUSCULAR | Status: AC
  Filled 2015-04-30: qty 1

## 2015-04-30 MED ORDER — BENAZEPRIL HCL 10 MG PO TABS
40.0000 mg | ORAL_TABLET | Freq: Every day | ORAL | Status: DC
  Administered 2015-04-30 – 2015-05-01 (×2): 40 mg via ORAL
  Filled 2015-04-30 (×2): qty 4

## 2015-04-30 MED ORDER — LABETALOL HCL 5 MG/ML IV SOLN
INTRAVENOUS | Status: AC
  Filled 2015-04-30: qty 4

## 2015-04-30 MED ORDER — FENTANYL CITRATE (PF) 100 MCG/2ML IJ SOLN
INTRAMUSCULAR | Status: DC | PRN
  Administered 2015-04-30 (×3): 50 ug via INTRAVENOUS
  Administered 2015-04-30: 100 ug via INTRAVENOUS

## 2015-04-30 MED ORDER — ALPRAZOLAM 0.25 MG PO TABS
0.2500 mg | ORAL_TABLET | Freq: Every evening | ORAL | Status: DC | PRN
Start: 2015-04-30 — End: 2015-05-01

## 2015-04-30 MED ORDER — ONDANSETRON HCL 4 MG/2ML IJ SOLN
INTRAMUSCULAR | Status: AC
  Filled 2015-04-30: qty 2

## 2015-04-30 MED ORDER — DIPHENHYDRAMINE HCL 50 MG/ML IJ SOLN: 12.5000 mg | Freq: Four times a day (QID) | INTRAMUSCULAR | Status: DC | PRN

## 2015-04-30 MED ORDER — LACTATED RINGERS IV SOLN
INTRAVENOUS | Status: DC | PRN
  Administered 2015-04-30: 09:00:00

## 2015-04-30 MED ORDER — LIDOCAINE HCL (CARDIAC) 20 MG/ML IV SOLN
INTRAVENOUS | Status: AC
  Filled 2015-04-30: qty 5

## 2015-04-30 MED ORDER — BUPIVACAINE-EPINEPHRINE (PF) 0.25% -1:200000 IJ SOLN
INTRAMUSCULAR | Status: AC
  Filled 2015-04-30: qty 30

## 2015-04-30 MED ORDER — FENTANYL CITRATE (PF) 250 MCG/5ML IJ SOLN
INTRAMUSCULAR | Status: AC
  Filled 2015-04-30: qty 5

## 2015-04-30 MED ORDER — PROPOFOL 10 MG/ML IV BOLUS
INTRAVENOUS | Status: DC | PRN
  Administered 2015-04-30: 180 mg via INTRAVENOUS

## 2015-04-30 MED ORDER — HYDROMORPHONE HCL 2 MG/ML IJ SOLN
INTRAMUSCULAR | Status: AC
  Filled 2015-04-30: qty 1

## 2015-04-30 MED ORDER — HYDROMORPHONE HCL 1 MG/ML IJ SOLN
0.2500 mg | INTRAMUSCULAR | Status: DC | PRN
  Administered 2015-04-30 (×4): 0.25 mg via INTRAVENOUS

## 2015-04-30 MED ORDER — MIDAZOLAM HCL 5 MG/5ML IJ SOLN
INTRAMUSCULAR | Status: DC | PRN
  Administered 2015-04-30: 2 mg via INTRAVENOUS

## 2015-04-30 MED ORDER — LABETALOL HCL 5 MG/ML IV SOLN
INTRAVENOUS | Status: DC | PRN
  Administered 2015-04-30: 5 mg via INTRAVENOUS

## 2015-04-30 MED ORDER — DIPHENHYDRAMINE HCL 12.5 MG/5ML PO ELIX: 12.5000 mg | ORAL_SOLUTION | Freq: Four times a day (QID) | ORAL | Status: DC | PRN

## 2015-04-30 MED ORDER — DEXAMETHASONE SODIUM PHOSPHATE 10 MG/ML IJ SOLN
INTRAMUSCULAR | Status: DC | PRN
  Administered 2015-04-30: 10 mg via INTRAVENOUS

## 2015-04-30 MED ORDER — HYDROMORPHONE HCL 1 MG/ML IJ SOLN
INTRAMUSCULAR | Status: AC
  Filled 2015-04-30: qty 1

## 2015-04-30 MED ORDER — SULFAMETHOXAZOLE-TRIMETHOPRIM 800-160 MG PO TABS: 1.0000 | ORAL_TABLET | Freq: Two times a day (BID) | ORAL | Status: DC

## 2015-04-30 MED ORDER — SODIUM CHLORIDE 0.9 % IR SOLN
Status: DC | PRN
  Administered 2015-04-30: 1 via INTRAVESICAL

## 2015-04-30 MED ORDER — KETOROLAC TROMETHAMINE 15 MG/ML IJ SOLN
15.0000 mg | Freq: Four times a day (QID) | INTRAMUSCULAR | Status: DC
  Administered 2015-04-30 – 2015-05-01 (×4): 15 mg via INTRAVENOUS
  Filled 2015-04-30 (×4): qty 1

## 2015-04-30 MED ORDER — EPHEDRINE SULFATE 50 MG/ML IJ SOLN
INTRAMUSCULAR | Status: DC | PRN
  Administered 2015-04-30: 5 mg via INTRAVENOUS

## 2015-04-30 MED ORDER — KCL IN DEXTROSE-NACL 20-5-0.45 MEQ/L-%-% IV SOLN
INTRAVENOUS | Status: DC
  Administered 2015-04-30 – 2015-05-01 (×2): via INTRAVENOUS
  Filled 2015-04-30 (×5): qty 1000

## 2015-04-30 MED ORDER — PROPOFOL 10 MG/ML IV BOLUS
INTRAVENOUS | Status: AC
  Filled 2015-04-30: qty 20

## 2015-04-30 MED ORDER — CEFAZOLIN SODIUM 1-5 GM-% IV SOLN
1.0000 g | Freq: Three times a day (TID) | INTRAVENOUS | Status: AC
  Administered 2015-04-30 (×2): 1 g via INTRAVENOUS
  Filled 2015-04-30 (×3): qty 50

## 2015-04-30 MED ORDER — ROCURONIUM BROMIDE 100 MG/10ML IV SOLN
INTRAVENOUS | Status: AC
  Filled 2015-04-30: qty 1

## 2015-04-30 MED ORDER — LACTATED RINGERS IV SOLN
INTRAVENOUS | Status: DC
Start: 2015-04-30 — End: 2015-04-30
  Administered 2015-04-30: 11:00:00 via INTRAVENOUS

## 2015-04-30 MED ORDER — ACETAMINOPHEN 325 MG PO TABS: 650.0000 mg | ORAL_TABLET | ORAL | Status: DC | PRN

## 2015-04-30 MED ORDER — DOCUSATE SODIUM 100 MG PO CAPS
100.0000 mg | ORAL_CAPSULE | Freq: Two times a day (BID) | ORAL | Status: DC
  Administered 2015-04-30 – 2015-05-01 (×3): 100 mg via ORAL
  Filled 2015-04-30 (×3): qty 1

## 2015-04-30 MED ORDER — DEXAMETHASONE SODIUM PHOSPHATE 10 MG/ML IJ SOLN
INTRAMUSCULAR | Status: AC
  Filled 2015-04-30: qty 1

## 2015-04-30 MED ORDER — SODIUM CHLORIDE 0.9 % IV BOLUS (SEPSIS)
1000.0000 mL | Freq: Once | INTRAVENOUS | Status: AC
  Administered 2015-04-30: 1000 mL via INTRAVENOUS

## 2015-04-30 SURGICAL SUPPLY — 55 items
APPLICATOR COTTON TIP 6IN STRL (MISCELLANEOUS) ×4 IMPLANT
CATH FOLEY 2WAY SLVR 18FR 30CC (CATHETERS) ×6 IMPLANT
CATH ROBINSON RED A/P 16FR (CATHETERS) ×4 IMPLANT
CATH ROBINSON RED A/P 8FR (CATHETERS) ×4 IMPLANT
CATH TIEMANN FOLEY 18FR 5CC (CATHETERS) ×4 IMPLANT
CHLORAPREP W/TINT 26ML (MISCELLANEOUS) ×4 IMPLANT
CLIP LIGATING HEM O LOK PURPLE (MISCELLANEOUS) ×8 IMPLANT
COVER SURGICAL LIGHT HANDLE (MISCELLANEOUS) ×4 IMPLANT
COVER TIP SHEARS 8 DVNC (MISCELLANEOUS) ×2 IMPLANT
COVER TIP SHEARS 8MM DA VINCI (MISCELLANEOUS) ×2
CUTTER ECHEON FLEX ENDO 45 340 (ENDOMECHANICALS) ×4 IMPLANT
DECANTER SPIKE VIAL GLASS SM (MISCELLANEOUS) ×4 IMPLANT
DRAPE ARM DVNC X/XI (DISPOSABLE) ×8 IMPLANT
DRAPE COLUMN DVNC XI (DISPOSABLE) ×2 IMPLANT
DRAPE DA VINCI XI ARM (DISPOSABLE) ×8
DRAPE DA VINCI XI COLUMN (DISPOSABLE) ×2
DRAPE SURG IRRIG POUCH 19X23 (DRAPES) ×4 IMPLANT
DRSG TEGADERM 4X4.75 (GAUZE/BANDAGES/DRESSINGS) ×4 IMPLANT
ELECT REM PT RETURN 9FT ADLT (ELECTROSURGICAL) ×4
ELECTRODE REM PT RTRN 9FT ADLT (ELECTROSURGICAL) ×2 IMPLANT
GLOVE BIO SURGEON STRL SZ 6.5 (GLOVE) ×3 IMPLANT
GLOVE BIO SURGEONS STRL SZ 6.5 (GLOVE) ×1
GLOVE BIOGEL M STRL SZ7.5 (GLOVE) ×8 IMPLANT
GOWN STRL REUS W/TWL LRG LVL3 (GOWN DISPOSABLE) ×12 IMPLANT
HOLDER FOLEY CATH W/STRAP (MISCELLANEOUS) ×4 IMPLANT
IV LACTATED RINGERS 1000ML (IV SOLUTION) ×4 IMPLANT
LIQUID BAND (GAUZE/BANDAGES/DRESSINGS) ×2 IMPLANT
NDL SAFETY ECLIPSE 18X1.5 (NEEDLE) ×2 IMPLANT
NEEDLE HYPO 18GX1.5 SHARP (NEEDLE) ×4
PACK ROBOT UROLOGY CUSTOM (CUSTOM PROCEDURE TRAY) ×4 IMPLANT
PLUG CATH AND CAP STER (CATHETERS) ×2 IMPLANT
RELOAD GREEN ECHELON 45 (STAPLE) ×4 IMPLANT
SCISSORS LAP 5X45 EPIX DISP (ENDOMECHANICALS) ×2 IMPLANT
SEAL CANN UNIV 5-8 DVNC XI (MISCELLANEOUS) ×8 IMPLANT
SEAL XI 5MM-8MM UNIVERSAL (MISCELLANEOUS) ×8
SET TUBE IRRIG SUCTION NO TIP (IRRIGATION / IRRIGATOR) ×4 IMPLANT
SOLUTION ELECTROLUBE (MISCELLANEOUS) ×4 IMPLANT
SUT ETHILON 3 0 PS 1 (SUTURE) ×4 IMPLANT
SUT MNCRL 3 0 RB1 (SUTURE) ×2 IMPLANT
SUT MNCRL 3 0 VIOLET RB1 (SUTURE) ×2 IMPLANT
SUT MNCRL AB 4-0 PS2 18 (SUTURE) ×8 IMPLANT
SUT MONOCRYL 3 0 RB1 (SUTURE) ×4
SUT VIC AB 0 CT1 27 (SUTURE) ×4
SUT VIC AB 0 CT1 27XBRD ANTBC (SUTURE) ×2 IMPLANT
SUT VIC AB 0 UR5 27 (SUTURE) ×4 IMPLANT
SUT VIC AB 2-0 SH 27 (SUTURE) ×4
SUT VIC AB 2-0 SH 27X BRD (SUTURE) ×2 IMPLANT
SUT VIC AB 3-0 SH 27 (SUTURE) ×4
SUT VIC AB 3-0 SH 27XBRD (SUTURE) IMPLANT
SUT VICRYL 0 UR6 27IN ABS (SUTURE) ×8 IMPLANT
SYR 27GX1/2 1ML LL SAFETY (SYRINGE) ×4 IMPLANT
TOWEL OR 17X26 10 PK STRL BLUE (TOWEL DISPOSABLE) ×4 IMPLANT
TOWEL OR NON WOVEN STRL DISP B (DISPOSABLE) ×4 IMPLANT
TUBING INSUFFLATION 10FT LAP (TUBING) IMPLANT
WATER STERILE IRR 1500ML POUR (IV SOLUTION) ×8 IMPLANT

## 2015-04-30 NOTE — Op Note (Signed)
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 Robotic assisted laparoscopic radical prostatectomy (bilateral nerve sparing) 2. Bilateral robotic assisted laparoscopic pelvic lymphadenectomy  Surgeon: Pryor Curia. M.D.  Assistant: Debbrah Alar, PA-C  Resident: Dr. Jearld Adjutant  Anesthesia: General  Complications: None  EBL: 100 mL  IVF:  1500 mL crystalloid  Specimens: 1. Prostate and seminal vesicles 2. Right pelvic lymph nodes 3. Left pelvic lymph nodes  Disposition of specimens: Pathology  Drains: 1. 20 Fr coude catheter 2. # 19 Blake pelvic drain  Indication: Daniel [name]. is a 70 y.o. year old patient with clinically localized prostate cancer.  After a thorough review of the management options for treatment of prostate cancer, he elected to proceed with surgical therapy and the above procedure(s).  We have discussed the potential benefits and risks of the procedure, side effects of the proposed treatment, the likelihood of the patient achieving the goals of the procedure, and any potential problems that might occur during the procedure or recuperation. Informed consent has been obtained.  Description of procedure:  The patient was taken to the operating room and a general anesthetic was administered. He was given preoperative antibiotics, placed in the dorsal lithotomy position, and prepped and draped in the usual sterile fashion. Next a preoperative timeout was performed. A urethral catheter was placed into the bladder and a site was selected near the umbilicus for placement of the camera port. This was placed using a standard open Hassan technique which allowed entry into the peritoneal cavity under direct vision and without difficulty.  There were some adhesions around the umbilicus  noted on palpation but the camera port was able to be placed.  An 8 mm robotic port was placed and a pneumoperitoneum established. The camera was then used to inspect the abdomen and there was no evidence of any intra-abdominal injuries.  There was noted to be omental adhesions around the umbilicus however. The remaining abdominal ports were then placed. 8 mm robotic ports were placed in the right lower quadrant, left lower quadrant, and far left lateral abdominal wall. A 5 mm port was placed in the right upper quadrant and a 12 mm port was placed in the right lateral abdominal wall for laparoscopic assistance. All ports were placed under direct vision without difficulty. The adhesions were then taken down with laparoscopic scissors sharply. The surgical cart was then docked.   Utilizing the cautery scissors, the bladder was reflected posteriorly allowing entry into the space of Retzius and identification of the endopelvic fascia and prostate. The periprostatic fat was then removed from the prostate allowing full exposure of the endopelvic fascia. The endopelvic fascia was then incised from the apex back to the base of the prostate bilaterally and the underlying levator muscle fibers were swept laterally off the prostate thereby isolating the dorsal venous complex. The dorsal vein was then stapled and divided with a 45 mm Flex Echelon stapler. Attention then turned to the bladder neck which was divided anteriorly thereby allowing entry into the bladder and exposure of the urethral catheter. The catheter balloon was deflated and the catheter was brought into the operative field and used to retract the prostate anteriorly. The posterior bladder neck was then examined and was divided allowing further dissection between the bladder and prostate posteriorly until the vasa deferentia and seminal vessels were identified. The vasa  deferentia were isolated, divided, and lifted anteriorly. The seminal vesicles were  dissected down to their tips with care to control the seminal vascular arterial blood supply. These structures were then lifted anteriorly and the space between Denonvillier's fascia and the anterior rectum was developed with a combination of sharp and blunt dissection. This isolated the vascular pedicles of the prostate.  The lateral prostatic fascia was then sharply incised allowing release of the neurovascular bundles bilaterally. The vascular pedicles of the prostate were then ligated with Weck clips between the prostate and neurovascular bundles and divided with sharp cold scissor dissection resulting in neurovascular bundle preservation. The neurovascular bundles were then separated off the apex of the prostate and urethra bilaterally.  The urethra was then sharply transected allowing the prostate specimen to be disarticulated. The pelvis was copiously irrigated and hemostasis was ensured. There was no evidence for rectal injury.  Attention then turned to the right pelvic sidewall. The fibrofatty tissue between the external iliac vein, confluence of the iliac vessels, hypogastric artery, and Cooper's ligament was dissected free from the pelvic sidewall with care to preserve the obturator nerve. Weck clips were used for lymphostasis and hemostasis. An identical procedure was performed on the contralateral side and the lymphatic packets were removed for permanent pathologic analysis.  Attention then turned to the urethral anastomosis. A 2-0 Vicryl slip knot was placed between Denonvillier's fascia, the posterior bladder neck, and the posterior urethra to reapproximate these structures. A double-armed 3-0 Monocryl suture was then used to perform a 360 running tension-free anastomosis between the bladder neck and urethra. A new urethral catheter was then placed into the bladder and irrigated. There were no blood clots within the bladder and the anastomosis appeared to be watertight. A #19 Blake drain was  then brought through the left lateral 8 mm port site and positioned appropriately within the pelvis. It was secured to the skin with a nylon suture. The surgical cart was then undocked. The right lateral 12 mm port site was closed at the fascial level with a 0 Vicryl suture placed laparoscopically. All remaining ports were then removed under direct vision. The prostate specimen was removed intact within the Endopouch retrieval bag via the periumbilical camera port site. This fascial opening was closed with two running 0 Vicryl sutures. 0.25% Marcaine was then injected into all port sites and all incisions were reapproximated at the skin level with 4-0 Monocryl subcuticular sutures and Dermabond. The patient appeared to tolerate the procedure well and without complications. The patient was able to be extubated and transferred to the recovery unit in satisfactory condition.   Pryor Curia MD

## 2015-04-30 NOTE — Discharge Instructions (Signed)

## 2015-04-30 NOTE — Progress Notes (Signed)
Patient ID: Daniel [name]., male   DOB: 30-Apr-1945, 70 y.o.   MRN: [id number]  Post-op note  Subjective: The patient is doing well.  No complaints.  Objective: Vital signs in last 24 hours: Temp:  [97.3 F (36.3 C)-97.6 F (36.4 C)] 97.5 F (36.4 C) (03/30 1500) Pulse Rate:  [60-75] 64 (03/30 1500) Resp:  [10-16] 12 (03/30 1500) BP: (144-193)/(75-98) 144/85 mmHg (03/30 1500) SpO2:  [98 %-100 %] 100 % (03/30 1500) Weight:  [78.019 kg (172 lb)] 78.019 kg (172 lb) (03/30 0507)  Intake/Output from previous day:   Intake/Output this shift: Total I/O In: 3085 [I.V.:2085; IV Piggyback:1000] Out: 510 [Urine:300; Drains:110; Blood:100]  Physical Exam:  General: Alert and oriented. Abdomen: Soft, Nondistended. Incisions: Clean and dry.  Lab Results:  Recent Labs  04/30/15 1102  HGB 15.2  HCT 44.6    Assessment/Plan: POD#0   1) Continue to monitor, ambulate, IS   Pryor Curia. MD   LOS: 0 days   Valisha Heslin,LES 04/30/2015, 3:54 PM

## 2015-04-30 NOTE — Transfer of Care (Signed)
Immediate Anesthesia Transfer of Care Note  Patient: Daniel [name].  Procedure(s) Performed: Procedure(s): XI ROBOTIC ASSISTED LAPAROSCOPIC RADICAL PROSTATECTOMY LEVEL 2 (N/A) PELVIC LYMPHADENECTOMY (Bilateral)  Patient Location: PACU  Anesthesia Type:General  Level of Consciousness: awake, alert  and oriented  Airway & Oxygen Therapy: Patient Spontanous Breathing and Patient connected to face mask oxygen  Post-op Assessment: Report given to RN and Post -op Vital signs reviewed and stable  Post vital signs: Reviewed and stable  Last Vitals:  Filed Vitals:   04/30/15 0511  BP: 159/98  Pulse: 75  Temp: 36.3 C  Resp: 16    Complications: No apparent anesthesia complications

## 2015-04-30 NOTE — Anesthesia Postprocedure Evaluation (Signed)
Anesthesia Post Note  Patient: Daniel [name].  Procedure(s) Performed: Procedure(s) (LRB): XI ROBOTIC ASSISTED LAPAROSCOPIC RADICAL PROSTATECTOMY LEVEL 2 (N/A) PELVIC LYMPHADENECTOMY (Bilateral)  Patient location during evaluation: PACU Anesthesia Type: General Level of consciousness: awake Pain management: pain level controlled Vital Signs Assessment: post-procedure vital signs reviewed and stable Respiratory status: spontaneous breathing Cardiovascular status: stable Anesthetic complications: no    Last Vitals:  Filed Vitals:   04/30/15 1040 04/30/15 1045  BP: 172/86 173/75  Pulse: 71 68  Temp: 36.4 C   Resp: 12 11    Last Pain:  Filed Vitals:   04/30/15 1057  PainSc: 0-No pain                 EDWARDS,Natoria Archibald

## 2015-04-30 NOTE — Anesthesia Preprocedure Evaluation (Signed)
Anesthesia Evaluation  Patient identified by MRN, date of birth, ID band Patient awake    Reviewed: Allergy & Precautions, NPO status , Patient's Chart, lab work & pertinent test results  Airway Mallampati: II  TM Distance: >3 FB Neck ROM: Full    Dental   Pulmonary COPD, Current Smoker,    breath sounds clear to auscultation       Cardiovascular hypertension, + dysrhythmias Atrial Fibrillation  Rhythm:Regular Rate:Normal     Neuro/Psych    GI/Hepatic Neg liver ROS, GERD  ,  Endo/Other    Renal/GU Renal disease     Musculoskeletal   Abdominal   Peds  Hematology   Anesthesia Other Findings   Reproductive/Obstetrics                             Anesthesia Physical Anesthesia Plan  ASA: III  Anesthesia Plan: General   Post-op Pain Management:    Induction: Intravenous  Airway Management Planned: Oral ETT  Additional Equipment:   Intra-op Plan:   Post-operative Plan: Extubation in OR  Informed Consent: I have reviewed the patients History and Physical, chart, labs and discussed the procedure including the risks, benefits and alternatives for the proposed anesthesia with the patient or authorized representative who has indicated his/her understanding and acceptance.   Dental advisory given  Plan Discussed with: CRNA, Anesthesiologist and Surgeon  Anesthesia Plan Comments:         Anesthesia Quick Evaluation

## 2015-04-30 NOTE — Anesthesia Procedure Notes (Signed)
Procedure Name: Intubation Date/Time: 04/30/2015 7:58 AM Performed by: Noralyn Pick D Pre-anesthesia Checklist: Patient identified, Emergency Drugs available, Suction available and Patient being monitored Patient Re-evaluated:Patient Re-evaluated prior to inductionOxygen Delivery Method: Circle System Utilized Preoxygenation: Pre-oxygenation with 100% oxygen Intubation Type: IV induction Ventilation: Mask ventilation without difficulty Laryngoscope Size: Mac and 3 Grade View: Grade II Tube type: Oral Tube size: 7.5 mm Number of attempts: 1 Airway Equipment and Method: Stylet and Oral airway Placement Confirmation: ETT inserted through vocal cords under direct vision,  positive ETCO2 and breath sounds checked- equal and bilateral Secured at: 22 cm Tube secured with: Tape Dental Injury: Teeth and Oropharynx as per pre-operative assessment

## 2015-04-30 NOTE — Progress Notes (Signed)
Agree with previous RN's assessment. 

## 2015-05-01 LAB — HEMOGLOBIN AND HEMATOCRIT, BLOOD
HCT: 40.2 % (ref 39.0–52.0)
Hemoglobin: 13.7 g/dL (ref 13.0–17.0)

## 2015-05-01 MED ORDER — BISACODYL 10 MG RE SUPP
10.0000 mg | Freq: Once | RECTAL | Status: AC
  Administered 2015-05-01: 10 mg via RECTAL
  Filled 2015-05-01: qty 1

## 2015-05-01 MED ORDER — HYDROCODONE-ACETAMINOPHEN 5-325 MG PO TABS: 1.0000 | ORAL_TABLET | Freq: Four times a day (QID) | ORAL | Status: DC | PRN

## 2015-05-01 NOTE — Progress Notes (Signed)
1 Day Post-Op Subjective: The patient is doing well.  No nausea or vomiting. Pain is adequately controlled.  Objective: Vital signs in last 24 hours: Temp:  [97.4 F (36.3 C)-98.2 F (36.8 C)] 98.2 F (36.8 C) (03/31 0511) Pulse Rate:  [57-71] 60 (03/31 0511) Resp:  [10-14] 14 (03/31 0511) BP: (125-193)/(67-98) 152/76 mmHg (03/31 0511) SpO2:  [97 %-100 %] 98 % (03/31 0511)  Intake/Output from previous day: 03/30 0701 - 03/31 0700 In: 5530 [I.V.:4480; IV Piggyback:1050] Out: 2730 [Urine:2410; Drains:220; Blood:100] Intake/Output this shift:    Physical Exam:  General: Alert and oriented. CV: RRR Lungs: Clear bilaterally. GI: Soft, Nondistended. Incisions: Clean, dry, and intact Urine: Clear Extremities: Nontender, no erythema, no edema.  Lab Results:  Recent Labs  04/30/15 1102 05/01/15 0453  HGB 15.2 13.7  HCT 44.6 40.2      Assessment/Plan: POD# 1 s/p robotic prostatectomy.  1) SL IVF 2) Ambulate, Incentive spirometry 3) Transition to oral pain medication 4) Dulcolax suppository 5) D/C pelvic drain 6) Plan for likely discharge later today   Pryor Curia. MD   LOS: 1 day   Christell Faith 05/01/2015, 7:19 AM

## 2015-05-01 NOTE — Progress Notes (Signed)
Patient educated on foley catheter with wife and son present.  Patient did return demonstration on exchanging from large drainage bag to leg bag without difficulty.  Patient educated on activity, diet, pain, incisions, and when to call the doctor.  Patient ready for d/c.

## 2015-05-01 NOTE — Discharge Summary (Signed)
Date of admission: 04/30/2015  Date of discharge: 05/01/2015  Admission diagnosis: Prostate Cancer  Discharge diagnosis: Prostate Cancer  History and Physical: For full details, please see admission history and physical. Briefly, Daniel Smyers. is a 70 y.o. gentleman with localized prostate cancer.  After discussing management/treatment options, he elected to proceed with surgical treatment.  Hospital Course: Daniel Thorstenson. was taken to the operating room on 04/30/2015 and underwent a robotic assisted laparoscopic radical prostatectomy. He tolerated this procedure well and without complications. Postoperatively, he was able to be transferred to a regular hospital room following recovery from anesthesia.  He was able to begin ambulating the night of surgery. He remained hemodynamically stable overnight.  He had excellent urine output with appropriately minimal output from his pelvic drain and his pelvic drain was removed on POD #1.  He was transitioned to oral pain medication, tolerated a clear liquid diet, and had met all discharge criteria and was able to be discharged home later on POD#1.  Laboratory values:  Recent Labs  04/30/15 1102 05/01/15 0453  HGB 15.2 13.7  HCT 44.6 40.2    Disposition: Home  Discharge instruction: He was instructed to be ambulatory but to refrain from heavy lifting, strenuous activity, or driving. He was instructed on urethral catheter care.  Discharge medications:     Medication List    STOP taking these medications        aspirin EC 81 MG tablet     ciprofloxacin 500 MG tablet  Commonly known as:  CIPRO     fish oil-omega-3 fatty acids 1000 MG capsule     metroNIDAZOLE 500 MG tablet  Commonly known as:  FLAGYL     tamsulosin 0.4 MG Caps capsule  Commonly known as:  FLOMAX     VITAMIN D-3 PO      TAKE these medications        ALPRAZolam 0.25 MG tablet  Commonly known as:  XANAX  1 tab po qhs prn     benazepril 40 MG tablet  Commonly  known as:  LOTENSIN  1/2 tab po qAM and 1 tab po qPM     hydrochlorothiazide 25 MG tablet  Commonly known as:  HYDRODIURIL  Take 1 tablet (25 mg total) by mouth daily.     HYDROcodone-acetaminophen 5-325 MG tablet  Commonly known as:  NORCO  Take 1-2 tablets by mouth every 6 (six) hours as needed for moderate pain.     sulfamethoxazole-trimethoprim 800-160 MG tablet  Commonly known as:  BACTRIM DS,SEPTRA DS  Take 1 tablet by mouth 2 (two) times daily. Start the day prior to foley removal appointment        Followup: He will followup in 1 week for catheter removal and to discuss his surgical pathology results.

## 2015-05-02 DIAGNOSIS — R339 Retention of urine, unspecified: Secondary | ICD-10-CM

## 2015-05-02 HISTORY — DX: Retention of urine, unspecified: R33.9

## 2015-05-09 ENCOUNTER — Encounter (HOSPITAL_COMMUNITY): Payer: Self-pay | Admitting: Emergency Medicine

## 2015-05-09 ENCOUNTER — Emergency Department (HOSPITAL_COMMUNITY)
Admission: EM | Admit: 2015-05-09 | Discharge: 2015-05-09 | Disposition: A | Discharge status: Home / Self Care | Payer: Medicare Other | Attending: Emergency Medicine | Admitting: Emergency Medicine

## 2015-05-09 DIAGNOSIS — R140 Abdominal distension (gaseous): Secondary | ICD-10-CM | POA: Insufficient documentation

## 2015-05-09 DIAGNOSIS — Z87438 Personal history of other diseases of male genital organs: Secondary | ICD-10-CM | POA: Diagnosis not present

## 2015-05-09 DIAGNOSIS — F17210 Nicotine dependence, cigarettes, uncomplicated: Secondary | ICD-10-CM | POA: Insufficient documentation

## 2015-05-09 DIAGNOSIS — J449 Chronic obstructive pulmonary disease, unspecified: Secondary | ICD-10-CM | POA: Insufficient documentation

## 2015-05-09 DIAGNOSIS — Z8719 Personal history of other diseases of the digestive system: Secondary | ICD-10-CM | POA: Diagnosis not present

## 2015-05-09 DIAGNOSIS — R300 Dysuria: Secondary | ICD-10-CM | POA: Insufficient documentation

## 2015-05-09 DIAGNOSIS — F419 Anxiety disorder, unspecified: Secondary | ICD-10-CM | POA: Diagnosis not present

## 2015-05-09 DIAGNOSIS — R339 Retention of urine, unspecified: Secondary | ICD-10-CM | POA: Diagnosis not present

## 2015-05-09 DIAGNOSIS — R338 Other retention of urine: Secondary | ICD-10-CM

## 2015-05-09 DIAGNOSIS — I10 Essential (primary) hypertension: Secondary | ICD-10-CM | POA: Insufficient documentation

## 2015-05-09 LAB — URINALYSIS, ROUTINE W REFLEX MICROSCOPIC
Bilirubin Urine: NEGATIVE
Glucose, UA: NEGATIVE mg/dL
KETONES UR: NEGATIVE mg/dL
Nitrite: NEGATIVE
PROTEIN: NEGATIVE mg/dL
Specific Gravity, Urine: 1.024 (ref 1.005–1.030)
pH: 6 (ref 5.0–8.0)

## 2015-05-09 LAB — URINE MICROSCOPIC-ADD ON
Bacteria, UA: NONE SEEN
SQUAMOUS EPITHELIAL / LPF: NONE SEEN

## 2015-05-09 MED ORDER — LIDOCAINE HCL 2 % EX GEL
1.0000 "application " | Freq: Once | CUTANEOUS | Status: AC
  Administered 2015-05-09: 1 via URETHRAL
  Filled 2015-05-09: qty 11

## 2015-05-09 NOTE — Discharge Instructions (Signed)
CAll [name] on Monday to discuss replacement of Foley  Acute Urinary Retention, Male Acute urinary retention is when you are unable to pee (urinate). Acute urinary retention is common in older men. Prostates can get bigger, which blocks the flow of pee.  HOME CARE  Drink enough fluids to keep your pee clear or pale yellow.  If you are sent home with a tube that drains the bladder (catheter), there will be a drainage bag attached to it. There are two types of bags. One is big that you can wear at night without having to empty it. One is smaller and needs to be emptied more often.  Keep the drainage bag empty.  Keep the drainage bag lower than your catheter.  Only take medicine as told by your doctor. GET HELP IF:  You have a low-grade fever.  You have spasms or you are leaking pee when you have spasms. GET HELP RIGHT AWAY IF:   You have chills or a fever.  Your catheter stops draining pee.  Your catheter falls out.  You have increased bleeding that does not stop after you have rested and increased the amount of fluids you had been drinking. MAKE SURE YOU:   Understand these instructions.  Will watch your condition.  Will get help right away if you are not doing well or get worse.   This information is not intended to replace advice given to you by your health care provider. Make sure you discuss any questions you have with your health care provider.   Document Released: 07/06/2007 Document Revised: 06/03/2014 Document Reviewed: 06/28/2012 Elsevier Interactive Patient Education [employer].

## 2015-05-09 NOTE — ED Notes (Addendum)
Pt reports he had prostate surgery recently and had urinary catheter removed yesterday. Urinated once this am, but then was unable to urinate. Also reports dysuria.

## 2015-05-09 NOTE — ED Provider Notes (Signed)
CSN: [id number]     Arrival date & time 05/09/15  1417 History   First MD Initiated Contact with Patient 05/09/15 1601     Chief Complaint  Patient presents with  . Urinary Retention     (Consider location/radiation/quality/duration/timing/severity/associated sxs/prior Treatment) HPI Comments: Patient states that he had a prostatectomy 10 days ago.  His Foley catheter was removed yesterday by Dr. Sherilyn Banker his urologist.  CT yesterday.  He was urinating freely.  This morning he urinated, but has not urinated since.  He says he is having abdominal discomfort and distention.  Denies any fever, constipation, nausea, vomiting  The history is provided by the patient.    Past Medical History  Diagnosis Date  . Hypertension   . Anxiety   . Atrial fibrillation ([place])     Limited episode, emergency room, June, 2013, spontaneous conversion to sinus rhythm, was evaluated By [name] 2013- no further visits.  . Ejection fraction     EF 65%, echo, 2008  . Nephrolithiasis     11 mm stone on R, 2 mm stone on L, ureters clear  . Prostatic adenocarcinoma ([place]) 02/2015    No evidence of metastatic dz on CT pelv 02/2015.  Urol ([name]) at [facility]; [name] referred him to [name] 03/2015: plans is for him to get bilat nerve sparing , robot assisted laparoscopic radical prostatectomy and pelvic lymphadenectomy.  . Diverticulitis   . GERD (gastroesophageal reflux disease)   . Tobacco dependence   . BPH (benign prostatic hypertrophy) with urinary obstruction     Nocturia is his primary symptom.  Acute urinary retention occurred when he got CT with contrast fall 2016 ([name]).  . Has received pneumococcal vaccination   . COPD (chronic obstructive pulmonary disease) ([place]) fall 2016    Bullous changes noted on lower lung images of CT abd done by urology  . Microscopic hematuria Fall 2016    CT nl except nonobstructing stones.  Cystoscopy normal 12/30/14 ([name])   Past Surgical History   Procedure Laterality Date  . Lithotripsy  2004    Dr. [place] Pink in [place].  . Inguinal hernia repair  2003 and 2004    Both sides.  . Colonoscopy  approx 2011 per pt    Done by surgeon in [place] after a bout of diverticulitis; normal per pt report--was told to repeat in 10 yrs.  . Aortic ultrasound  07/2012    NO AAA  . Transthoracic echocardiogram  03/22/2006    EF 65%, normal valves, no wall motion abnormalties, LA size normal  . Prostate biopsy  02/05/15    Prostate adenocarcinoma: pt considering treatment options as of 02/19/15  . Robot assisted laparoscopic radical prostatectomy N/A 04/30/2015    Procedure: XI ROBOTIC ASSISTED LAPAROSCOPIC RADICAL PROSTATECTOMY LEVEL 2;  Surgeon: [name], MD;  Location: [facility];  Service: Urology;  Laterality: N/A;  . Lymphadenectomy Bilateral 04/30/2015    Procedure: PELVIC LYMPHADENECTOMY;  Surgeon: [name], MD;  Location: [facility];  Service: Urology;  Laterality: Bilateral;   Family History  Problem Relation Age of Onset  . Hypertension Mother   . Cancer Father     Lung ca age 30  . Diabetes Sister   . Cancer - Other Mother 34    breast   Social History  Substance Use Topics  . Smoking status: Current Every Day Smoker -- 1.00 packs/day for 55 years    Types: Cigarettes  . Smokeless tobacco: Never Used  . Alcohol  Use: Yes     Comment: rare    Review of Systems  Constitutional: Negative for fever.  Gastrointestinal: Positive for abdominal distention. Negative for abdominal pain.  Genitourinary: Positive for decreased urine volume.  Neurological: Negative for dizziness.  All other systems reviewed and are negative.     Allergies  Amlodipine and Contrast media  Home Medications   Prior to Admission medications   Medication Sig Start Date End Date Taking? Authorizing Provider  ALPRAZolam (XANAX) 0.25 MG tablet 1 tab po qhs prn Patient taking differently: Take 0.25 mg by mouth at bedtime as needed for anxiety or sleep.  02/04/15   Yes [name], MD  benazepril (LOTENSIN) 40 MG tablet 1/2 tab po qAM and 1 tab po qPM Patient taking differently: Take 20-40 mg by mouth 2 (two) times daily. 1/2 tab po qAM and 1 tab po qPM 11/21/14  Yes [name], MD  hydrochlorothiazide (HYDRODIURIL) 25 MG tablet Take 1 tablet (25 mg total) by mouth daily. Patient taking differently: Take 12.5 mg by mouth daily. Pt takes 1/2 qd 12/16/13  Yes [name], MD  sulfamethoxazole-trimethoprim (BACTRIM DS,SEPTRA DS) 800-160 MG tablet Take 1 tablet by mouth 2 (two) times daily. Start the day prior to foley removal appointment 04/30/15  Yes Debbrah Alar, PA-C  HYDROcodone-acetaminophen (NORCO) 5-325 MG tablet Take 1-2 tablets by mouth every 6 (six) hours as needed for moderate pain. Patient not taking: Reported on 05/09/2015 04/30/15   Debbrah Alar, PA-C   BP 133/76 mmHg  Pulse 66  Temp(Src) 97.8 F (36.6 C) (Oral)  Resp 18  SpO2 98% Physical Exam  Constitutional: He appears well-developed and well-nourished.  HENT:  Head: Normocephalic.  Eyes: Pupils are equal, round, and reactive to light.  Neck: Normal range of motion.  Cardiovascular: Normal rate and regular rhythm.   Pulmonary/Chest: Effort normal and breath sounds normal.  Abdominal: Soft. He exhibits distension. There is no tenderness.  Musculoskeletal: Normal range of motion.  Neurological: He is alert.  Nursing note and vitals reviewed.   ED Course  Procedures (including critical care time) Labs Review Labs Reviewed  URINALYSIS, ROUTINE W REFLEX MICROSCOPIC (NOT AT [facility]) - Abnormal; Notable for the following:    Color, Urine AMBER (*)    Hgb urine dipstick LARGE (*)    Leukocytes, UA TRACE (*)    All other components within normal limits  URINE MICROSCOPIC-ADD ON    Imaging Review No results found. I have personally reviewed and evaluated these images and lab results as part of my medical decision-making.   EKG Interpretation None     Foley catheter  placed by nursing  400 cc+ immediate return, Patient now confortable  Urine Reviewed.  No sign of infection at this time.  Patient has been instructed to call [name] on Monday to set an appointment to again have the Foley catheter in the [facility]   Final diagnoses:  Acute urinary retention         Junius Creamer, NP 05/09/15 [place], MD 05/13/15 [phone]

## 2015-05-12 DIAGNOSIS — R338 Other retention of urine: Secondary | ICD-10-CM | POA: Diagnosis not present

## 2015-05-13 ENCOUNTER — Encounter: Payer: Self-pay | Admitting: Family Medicine

## 2015-05-14 ENCOUNTER — Encounter: Payer: Self-pay | Admitting: Family Medicine

## 2015-06-02 DIAGNOSIS — C61 Malignant neoplasm of prostate: Secondary | ICD-10-CM | POA: Diagnosis not present

## 2015-06-02 DIAGNOSIS — M6281 Muscle weakness (generalized): Secondary | ICD-10-CM | POA: Diagnosis not present

## 2015-06-17 DIAGNOSIS — C61 Malignant neoplasm of prostate: Secondary | ICD-10-CM | POA: Diagnosis not present

## 2015-06-24 DIAGNOSIS — M6281 Muscle weakness (generalized): Secondary | ICD-10-CM | POA: Diagnosis not present

## 2015-06-24 DIAGNOSIS — C61 Malignant neoplasm of prostate: Secondary | ICD-10-CM | POA: Diagnosis not present

## 2015-06-24 DIAGNOSIS — Z0000 Encounter for general adult medical examination without abnormal findings: Secondary | ICD-10-CM | POA: Diagnosis not present

## 2015-06-25 ENCOUNTER — Encounter: Payer: Self-pay | Admitting: Family Medicine

## 2015-06-30 ENCOUNTER — Encounter: Payer: Self-pay | Admitting: Family Medicine

## 2015-07-20 ENCOUNTER — Encounter: Payer: Self-pay | Admitting: Family Medicine

## 2015-07-20 ENCOUNTER — Ambulatory Visit (INDEPENDENT_AMBULATORY_CARE_PROVIDER_SITE_OTHER): Payer: Medicare Other | Admitting: Family Medicine

## 2015-07-20 VITALS — BP 180/92 | HR 52 | Temp 97.6°F | Resp 16 | Ht 69.5 in | Wt 165.2 lb

## 2015-07-20 DIAGNOSIS — F17200 Nicotine dependence, unspecified, uncomplicated: Secondary | ICD-10-CM

## 2015-07-20 DIAGNOSIS — E785 Hyperlipidemia, unspecified: Secondary | ICD-10-CM | POA: Diagnosis not present

## 2015-07-20 DIAGNOSIS — C61 Malignant neoplasm of prostate: Secondary | ICD-10-CM

## 2015-07-20 DIAGNOSIS — I10 Essential (primary) hypertension: Secondary | ICD-10-CM

## 2015-07-20 DIAGNOSIS — F419 Anxiety disorder, unspecified: Secondary | ICD-10-CM | POA: Diagnosis not present

## 2015-07-20 LAB — COMPREHENSIVE METABOLIC PANEL
ALBUMIN: 4.2 g/dL (ref 3.5–5.2)
ALK PHOS: 80 U/L (ref 39–117)
ALT: 12 U/L (ref 0–53)
AST: 15 U/L (ref 0–37)
BUN: 22 mg/dL (ref 6–23)
CALCIUM: 10 mg/dL (ref 8.4–10.5)
CO2: 27 mEq/L (ref 19–32)
Chloride: 105 mEq/L (ref 96–112)
Creatinine, Ser: 0.85 mg/dL (ref 0.40–1.50)
GFR: 94.65 mL/min (ref >60.00)
Glucose, Bld: 85 mg/dL (ref 70–99)
POTASSIUM: 4.7 meq/L (ref 3.5–5.1)
Sodium: 142 mEq/L (ref 135–145)
TOTAL PROTEIN: 6.9 g/dL (ref 6.0–8.3)
Total Bilirubin: 0.6 mg/dL (ref 0.2–1.2)

## 2015-07-20 LAB — LIPID PANEL
CHOLESTEROL: 172 mg/dL (ref 0–200)
HDL: 34.7 mg/dL — AB (ref >39.00)
LDL CALC: 121 mg/dL — AB (ref 0–99)
NonHDL: 137.13
TRIGLYCERIDES: 82 mg/dL (ref 0.0–149.0)
Total CHOL/HDL Ratio: 5
VLDL: 16.4 mg/dL (ref 0.0–40.0)

## 2015-07-20 MED ORDER — HYDRALAZINE HCL 10 MG PO TABS: 10.0000 mg | ORAL_TABLET | Freq: Three times a day (TID) | ORAL | Status: DC

## 2015-07-20 NOTE — Progress Notes (Signed)
OFFICE VISIT  07/20/2015   CC:  Chief Complaint  Patient presents with  . Follow-up    Pt is fasting.      HPI:    Patient is a 70 y.o. Caucasian male who presents for 6 mo f/u HTN, anxiety, and tobacco dependence. Since I last saw him he got radical prostatectomy and pelvic lymphadenectomy for prostate cancer. He is on close PSA surveillance as his mode of care at this time--no adjuvant radiation tx was done per pt choice.  Says he feels good. BP 150-160s over 90-100 since he went back to work a month ago.  HCTZ 12.5mg  qod---says taking it every day he has a period in mid morning where he feels like his bp drops---120/50-60 when he checks it during this time.  Tobacco: smoking 10 cigs/day still---says the stress of work is getting to him.  Plans on going on vacation for a few weeks, then retiring.  Anxiety: says he takes a 1/2 of xanax 0.25mg  qAM a few days a wee avg.  This seems to help him "keep calm" at work with all the stress.    Past Medical History  Diagnosis Date  . Hypertension   . Anxiety   . Atrial fibrillation ([place])     Limited episode, emergency room, June, 2013, spontaneous conversion to sinus rhythm, was evaluated By [name] 2013- no further visits.  . Ejection fraction     EF 65%, echo, 2008  . Nephrolithiasis     11 mm stone on R, 2 mm stone on L, ureters clear  . 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  Urol ([name]) at [facility]; [name] referred him to [name] 03/2015: patient got bilat nerve sparing , robot assisted laparoscopic radical prostatectomy and pelvic lymphadenectomy.  As of 06/24/15 urol f/u, PSA undetectable, pt chose close PSA surveillance over adjuvant radiation tx.  . Diverticulitis   . GERD (gastroesophageal reflux disease)   . Tobacco dependence   . BPH (benign prostatic hypertrophy) with urinary obstruction     Nocturia is his primary symptom.  Acute urinary retention occurred  when he got CT with contrast fall 2016 ([name]).  . Has received pneumococcal vaccination   . COPD (chronic obstructive pulmonary disease) ([place]) fall 2016    Bullous changes noted on lower lung images of CT abd done by urology  . Microscopic hematuria Fall 2016    CT nl except nonobstructing stones.  Cystoscopy normal 12/30/14 ([name])  . Urinary retention 05/2015    occurred s/p foley removal    Past Surgical History  Procedure Laterality Date  . Lithotripsy  2004    Dr. Maryland Pink in [place].  . Inguinal hernia repair  2003 and 2004    Both sides.  . Colonoscopy  approx 2011 per pt    Done by surgeon in [place] after a bout of diverticulitis; normal per pt report--was told to repeat in 10 yrs.  . Aortic ultrasound  07/2012    NO AAA  . Transthoracic echocardiogram  03/22/2006    EF 65%, normal valves, no wall motion abnormalties, LA size normal  . Prostate biopsy  02/05/15    Prostate adenocarcinoma: pt considering treatment options as of 02/19/15  . Robot assisted laparoscopic radical prostatectomy N/A 04/30/2015    Procedure: XI ROBOTIC ASSISTED LAPAROSCOPIC RADICAL PROSTATECTOMY LEVEL 2;  Surgeon: [name], MD;  [facility]: [facility];  Service: Urology;  Laterality: N/A;  .  Lymphadenectomy Bilateral 04/30/2015    Procedure: PELVIC LYMPHADENECTOMY;  Surgeon: [name], MD;  [facility];  Service: Urology;  Laterality: Bilateral;    Outpatient Prescriptions Prior to Visit  Medication Sig Dispense Refill  . ALPRAZolam (XANAX) 0.25 MG tablet 1 tab po qhs prn (Patient taking differently: Take 0.25 mg by mouth at bedtime as needed for anxiety or sleep. ) 30 tablet 2  . benazepril (LOTENSIN) 40 MG tablet 1/2 tab po qAM and 1 tab po qPM 135 tablet 2  . hydrochlorothiazide (HYDRODIURIL) 25 MG tablet Take 1 tablet (25 mg total) by mouth daily. (Patient taking differently: Take 12.5 mg by mouth daily. Pt takes 1/2 qd) 30 tablet 10  . HYDROcodone-acetaminophen (NORCO) 5-325 MG tablet Take  1-2 tablets by mouth every 6 (six) hours as needed for moderate pain. (Patient not taking: Reported on 05/09/2015) 30 tablet 0  . sulfamethoxazole-trimethoprim (BACTRIM DS,SEPTRA DS) 800-160 MG tablet Take 1 tablet by mouth 2 (two) times daily. Start the day prior to foley removal appointment (Patient not taking: Reported on 07/20/2015) 6 tablet 0   No facility-administered medications prior to visit.    Allergies  Allergen Reactions  . Amlodipine     LE swelling  . Contrast Media [Iodinated Diagnostic Agents]     Prostate problem had to wear a catheter for two weeks after having dye.     ROS As per HPI  PE: Blood pressure 171/78, pulse 52, temperature 97.6 F (36.4 C), temperature source Oral, resp. rate 16, height 5' 9.5" (1.765 m), weight 165 lb 4 oz (74.957 kg), SpO2 99 %. 180/9290 Gen: Alert, well appearing.  Patient is oriented to person, place, time, and situation. ENT: Ears: EACs clear, normal epithelium.  TMs with good light reflex and landmarks bilaterally.  Eyes: no injection, icteris, swelling, or exudate.  EOMI, PERRLA. Nose: no drainage or turbinate edema/swelling.  No injection or focal lesion.  Mouth: lips without lesion/swelling.  Oral mucosa pink and moist.  Dentition intact and without obvious caries or gingival swelling.  Oropharynx without erythema, exudate, or swelling.  CV: Regular with occ ectopic beat, bradycardic to the 50s, no m/r/g Chest is clear, no wheezing or rales. Normal symmetric air entry throughout both lung fields. No chest wall deformities or tenderness. EXT: no clubbing, cyanosis, or edema.    LABS:   Lab Results  Component Value Date   TSH 0.96 08/16/2012   Lab Results  Component Value Date   WBC 6.2 04/27/2015   HGB 13.7 05/01/2015   HCT 40.2 05/01/2015   MCV 92.6 04/27/2015   PLT 161 04/27/2015   Lab Results  Component Value Date   CREATININE 0.83 04/27/2015   BUN 17 04/27/2015   NA 141 04/27/2015   K 4.5 04/27/2015   CL 108  04/27/2015   CO2 26 04/27/2015   Lab Results  Component Value Date   ALT 17 08/09/2013   AST 23 08/09/2013   ALKPHOS 76 08/09/2013   BILITOT 0.8 08/09/2013   Lab Results  Component Value Date   CHOL 159 08/09/2013   Lab Results  Component Value Date   HDL 35.30* 08/09/2013   Lab Results  Component Value Date   LDLCALC 109* 08/09/2013   Lab Results  Component Value Date   TRIG 72.0 08/09/2013   Lab Results  Component Value Date   CHOLHDL 5 08/09/2013   IMPRESSION AND PLAN:  1) HTN; not well controlled.  Seems to be very sensitive to the effect of hctz  per his report. Will d/c hctz and start hydralazine 10mg  tid. Cont home bp monitoring. BMET today.  2) Hyperlipidemia: not on meds.  Check FLP today.  3) Anxiety: stable.  Continue low dose prn xanax.  4) Tobacco dependence: encouraged pt to quit completely.  5) Prostate ca: treated/remission---close monitoring of PSA per urology.  An After Visit Summary was printed and given to the patient.   FOLLOW UP: Return in about 2 weeks (around 08/03/2015) for f/u HTN.  Signed:  Crissie Sickles, MD           07/20/2015

## 2015-07-20 NOTE — Progress Notes (Signed)
Pre visit review using our clinic review tool, if applicable. No additional management support is needed unless otherwise documented below in the visit note. 

## 2015-07-21 ENCOUNTER — Encounter: Payer: Self-pay | Admitting: Family Medicine

## 2015-07-22 ENCOUNTER — Other Ambulatory Visit: Payer: Self-pay | Admitting: *Deleted

## 2015-07-22 MED ORDER — ALPRAZOLAM 0.25 MG PO TABS: ORAL_TABLET | ORAL | Status: DC

## 2015-07-22 NOTE — Telephone Encounter (Signed)
Rx faxed

## 2015-07-22 NOTE — Telephone Encounter (Signed)
RF request for alprazolam LOV: 07/20/15 Next ov: 08/03/15 Last written: 02/04/15 #30 w/ 2RF  Please advise. Thanks.

## 2015-08-03 ENCOUNTER — Encounter: Payer: Self-pay | Admitting: Family Medicine

## 2015-08-03 ENCOUNTER — Ambulatory Visit (INDEPENDENT_AMBULATORY_CARE_PROVIDER_SITE_OTHER): Payer: Medicare Other | Admitting: Family Medicine

## 2015-08-03 VITALS — BP 191/100 | HR 53 | Temp 97.7°F | Resp 16 | Ht 69.5 in | Wt 168.2 lb

## 2015-08-03 DIAGNOSIS — E785 Hyperlipidemia, unspecified: Secondary | ICD-10-CM

## 2015-08-03 DIAGNOSIS — I10 Essential (primary) hypertension: Secondary | ICD-10-CM | POA: Diagnosis not present

## 2015-08-03 MED ORDER — HYDROCHLOROTHIAZIDE 25 MG PO TABS: ORAL_TABLET | ORAL | Status: DC

## 2015-08-03 NOTE — Progress Notes (Signed)
OFFICE VISIT  08/03/2015   CC:  Chief Complaint  Patient presents with  . Follow-up    HTN. Pt is not fasting.    HPI:    Patient is a 70 y.o. Caucasian male who presents for 2 week f/u HTN. Started hydralazine 10mg  tid last visit and he says this med made him feel weak and like he has an irregular heart beat.  He reverted back to hctz and once again this brough his bp into normal range.    Last visit his cholesterol was elevated and he declined trial of statin, wants to try low chol diet. He states cholesterol meds don't agree with him.  Past Medical History  Diagnosis Date  . Hypertension   . Anxiety   . Atrial fibrillation ([place])     Limited episode, emergency room, June, 2013, spontaneous conversion to sinus rhythm, was evaluated By [name] 2013- no further visits.  . Hyperlipidemia     statin intolerant  . Nephrolithiasis     11 mm stone on R, 2 mm stone on L, ureters clear  . 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  Urol ([name]) at [facility]; [name] referred him to [name] 03/2015: patient got bilat nerve sparing , robot assisted laparoscopic radical prostatectomy and pelvic lymphadenectomy.  As of 06/24/15 urol f/u, PSA undetectable, pt chose close PSA surveillance over adjuvant radiation tx.  . Diverticulitis   . GERD (gastroesophageal reflux disease)   . Tobacco dependence   . BPH (benign prostatic hypertrophy) with urinary obstruction     Nocturia is his primary symptom.  Acute urinary retention occurred when he got CT with contrast fall 2016 ([name]).  . Has received pneumococcal vaccination   . COPD (chronic obstructive pulmonary disease) ([place]) fall 2016    Bullous changes noted on lower lung images of CT abd done by urology  . Microscopic hematuria Fall 2016    CT nl except nonobstructing stones.  Cystoscopy normal 12/30/14 ([name])  . Urinary retention 05/2015    occurred s/p foley removal     Past Surgical History  Procedure Laterality Date  . Lithotripsy  2004    Dr. Maryland Pink in [place].  . Inguinal hernia repair  2003 and 2004    Both sides.  . Colonoscopy  approx 2011 per pt    Done by surgeon in [place] after a bout of diverticulitis; normal per pt report--was told to repeat in 10 yrs.  . Aortic ultrasound  07/2012    NO AAA  . Transthoracic echocardiogram  03/22/2006    EF 65%, normal valves, no wall motion abnormalties, LA size normal  . Prostate biopsy  02/05/15    Prostate adenocarcinoma: pt considering treatment options as of 02/19/15  . Robot assisted laparoscopic radical prostatectomy N/A 04/30/2015    Procedure: XI ROBOTIC ASSISTED LAPAROSCOPIC RADICAL PROSTATECTOMY LEVEL 2;  Surgeon: [name], MD;  [facility];  Service: Urology;  Laterality: N/A;  . Lymphadenectomy Bilateral 04/30/2015    Procedure: PELVIC LYMPHADENECTOMY;  Surgeon: [name], MD;  [facility];  Service: Urology;  Laterality: Bilateral;    Outpatient Prescriptions Prior to Visit  Medication Sig Dispense Refill  . ALPRAZolam (XANAX) 0.25 MG tablet 1 tab po qhs prn 30 tablet 2  . benazepril (LOTENSIN) 40 MG tablet 1/2 tab po qAM and 1 tab po qPM 135 tablet 2  . Omega-3 Fatty Acids (FISH OIL) 500 MG CAPS  Take 1 capsule by mouth daily.    . Vitamin D, Cholecalciferol, 400 units CAPS Take 1 capsule by mouth daily.    . hydrALAZINE (APRESOLINE) 10 MG tablet Take 1 tablet (10 mg total) by mouth 3 (three) times daily. (Patient not taking: Reported on 08/03/2015) 90 tablet 0   No facility-administered medications prior to visit.    Allergies  Allergen Reactions  . Amlodipine     LE swelling  . Contrast Media [Iodinated Diagnostic Agents]     Prostate problem had to wear a catheter for two weeks after having dye.     ROS As per HPI  PE: Blood pressure 191/100, pulse 53, temperature 97.7 F (36.5 C), temperature source Oral, resp. rate 16, height 5' 9.5" (1.765 m), weight 168 lb 4  oz (76.318 kg), SpO2 99 %. Gen: Alert, well appearing.  Patient is oriented to person, place, time, and situation. CV: RRR, no m/r/g.   LUNGS: CTA bilat, nonlabored resps, good aeration in all lung fields. EXT: no clubbing, cyanosis, or edema.    LABS:    Chemistry      Component Value Date/Time   NA 142 07/20/2015 0825   K 4.7 07/20/2015 0825   CL 105 07/20/2015 0825   CO2 27 07/20/2015 0825   BUN 22 07/20/2015 0825   CREATININE 0.85 07/20/2015 0825      Component Value Date/Time   CALCIUM 10.0 07/20/2015 0825   ALKPHOS 80 07/20/2015 0825   AST 15 07/20/2015 0825   ALT 12 07/20/2015 0825   BILITOT 0.6 07/20/2015 0825     Lab Results  Component Value Date   CHOL 172 07/20/2015   HDL 34.70* 07/20/2015   LDLCALC 121* 07/20/2015   TRIG 82.0 07/20/2015   CHOLHDL 5 07/20/2015    IMPRESSION AND PLAN:  1) HTN, poor control: seems to have good control on benaz 40mg  1/2 qAM and 1 qPM along with 1/2 of HCTZ 25mg  qd.  He feels sluggish on this regimen but I want to give his body time to adjust--perhaps he is used to feeling a much higher bp, so a normal bp feels low to him.   At any rate, sounds like he did not tolerate hydralazine.  2) Hyperlipidemia: pt chooses dietary modification over statin (says statins "don't agree with him").  An After Visit Summary was printed and given to the patient.  FOLLOW UP: Return in about 3 weeks (around 08/24/2015).  Signed:  Crissie Sickles, MD           08/03/2015

## 2015-08-03 NOTE — Progress Notes (Signed)
Pre visit review using our clinic review tool, if applicable. No additional management support is needed unless otherwise documented below in the visit note. 

## 2015-08-27 ENCOUNTER — Encounter: Payer: Self-pay | Admitting: Family Medicine

## 2015-08-27 ENCOUNTER — Ambulatory Visit (INDEPENDENT_AMBULATORY_CARE_PROVIDER_SITE_OTHER): Payer: Medicare Other | Admitting: Family Medicine

## 2015-08-27 VITALS — BP 183/99 | HR 54 | Temp 97.6°F | Resp 16 | Ht 69.5 in | Wt 170.5 lb

## 2015-08-27 DIAGNOSIS — I10 Essential (primary) hypertension: Secondary | ICD-10-CM | POA: Diagnosis not present

## 2015-08-27 DIAGNOSIS — F17200 Nicotine dependence, unspecified, uncomplicated: Secondary | ICD-10-CM

## 2015-08-27 NOTE — Progress Notes (Signed)
Pre visit review using our clinic review tool, if applicable. No additional management support is needed unless otherwise documented below in the visit note. 

## 2015-08-27 NOTE — Progress Notes (Signed)
OFFICE VISIT  08/27/2015   CC:  Chief Complaint  Patient presents with  . Follow-up    Pt is not fasting.      HPI:    Patient is a 70 y.o. Caucasian male who presents for 3 weeks f/u uncontrolled HTN. Home bp early every morning 137-148 over 77-95.  Lunch time about the same but no high diastolics. This morning at home it was 151/102.  When it got down into 120s over 60s he would feel mild generalized weakness feeling.  His feeling of weakness is not occurring as often as it was. Still smoking some: about 10 cigs per day--mostly while at work when he gets stressed. He is getting ready to retire by fall this year.    Past Medical History:  Diagnosis Date  . Anxiety   . Atrial fibrillation ([place])    Limited episode, emergency room, June, 2013, spontaneous conversion to sinus rhythm, was evaluated By [name] 2013- no further visits.  [name] BPH (benign prostatic hypertrophy) with urinary obstruction    Nocturia is his primary symptom.  Acute urinary retention occurred when he got CT with contrast fall 2016 ([name]).  . COPD (chronic obstructive pulmonary disease) ([place]) fall 2016   Bullous changes noted on lower lung images of CT abd done by urology  . Diverticulitis   . GERD (gastroesophageal reflux disease)   . Has received pneumococcal vaccination   . Hyperlipidemia    statin intolerant  . Hypertension   . Microscopic hematuria Fall 2016   CT nl except nonobstructing stones.  Cystoscopy normal 12/30/14 ([name])  . Nephrolithiasis    11 mm stone on R, 2 mm stone on L, ureters clear  . 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  Urol ([name]) at [facility]; [name] referred him to [name] 03/2015: patient got bilat nerve sparing , robot assisted laparoscopic radical prostatectomy and pelvic lymphadenectomy.  As of 06/24/15 urol f/u, PSA undetectable, pt chose close PSA surveillance over adjuvant radiation tx.  . Tobacco  dependence   . Urinary retention 05/2015   occurred s/p foley removal    Past Surgical History:  Procedure Laterality Date  . aortic ultrasound  07/2012   NO AAA  . COLONOSCOPY  approx 2011 per pt   Done by surgeon in [place] after a bout of diverticulitis; normal per pt report--was told to repeat in 10 yrs.  . INGUINAL HERNIA REPAIR  2003 and 2004   Both sides.  [name] LITHOTRIPSY  2004   Dr. Maryland Pink in [place].  [name] LYMPHADENECTOMY Bilateral 04/30/2015   Procedure: PELVIC LYMPHADENECTOMY;  Surgeon: [name], MD;  [facility];  Service: Urology;  Laterality: Bilateral;  . PROSTATE BIOPSY  02/05/15   Prostate adenocarcinoma: pt considering treatment options as of 02/19/15  . ROBOT ASSISTED LAPAROSCOPIC RADICAL PROSTATECTOMY N/A 04/30/2015   Procedure: XI ROBOTIC ASSISTED LAPAROSCOPIC RADICAL PROSTATECTOMY LEVEL 2;  Surgeon: [name], MD;  [facility];  Service: Urology;  Laterality: N/A;  . TRANSTHORACIC ECHOCARDIOGRAM  03/22/2006   EF 65%, normal valves, no wall motion abnormalties, LA size normal    Outpatient Medications Prior to Visit  Medication Sig Dispense Refill  . ALPRAZolam (XANAX) 0.25 MG tablet 1 tab po qhs prn 30 tablet 2  . benazepril (LOTENSIN) 40 MG tablet 1/2 tab po qAM and 1 tab po qPM 135 tablet 2  . hydrochlorothiazide (HYDRODIURIL) 25 MG tablet 1/2 tab po qd  15 tablet 6  . Omega-3 Fatty Acids (FISH OIL) 500 MG CAPS Take 1 capsule by mouth daily.    . Vitamin D, Cholecalciferol, 400 units CAPS Take 1 capsule by mouth daily.     No facility-administered medications prior to visit.     Allergies  Allergen Reactions  . Amlodipine     LE swelling  . Contrast Media [Iodinated Diagnostic Agents]     Prostate problem had to wear a catheter for two weeks after having dye.   [name] Hydralazine Palpitations    Fatigue+    ROS As per HPI  PE: Blood pressure (!) 183/99, pulse (!) 54, temperature 97.6 F (36.4 C), temperature source Oral, resp. rate 16, height 5'  9.5" (1.765 m), weight 170 lb 8 oz (77.3 kg), SpO2 99 %. Gen: Alert, well appearing.  Patient is oriented to person, place, time, and situation. CV: RRR, no m/r/g.   LUNGS: CTA bilat, nonlabored resps, good aeration in all lung fields. EXT: no clubbing, cyanosis, or edema.    LABS:    Chemistry      Component Value Date/Time   NA 142 07/20/2015 0825   K 4.7 07/20/2015 0825   CL 105 07/20/2015 0825   CO2 27 07/20/2015 0825   BUN 22 07/20/2015 0825   CREATININE 0.85 07/20/2015 0825      Component Value Date/Time   CALCIUM 10.0 07/20/2015 0825   ALKPHOS 80 07/20/2015 0825   AST 15 07/20/2015 0825   ALT 12 07/20/2015 0825   BILITOT 0.6 07/20/2015 0825       IMPRESSION AND PLAN:  1) HTN: The current medical regimen is effective;  continue present plan and medications. He is more fearful of low bp than high bp, wants to keep bp med regimen the same. His home measurements sound pretty reasonable. Encouraged total smoking cessation---he will hopefully retire soon and hopefully quit after this.  An After Visit Summary was printed and given to the patient.  FOLLOW UP: Return in about 3 months (around 11/27/2015) for routine chronic illness f/u.  Signed:  Crissie Sickles, MD           08/27/2015

## 2015-10-15 DIAGNOSIS — C61 Malignant neoplasm of prostate: Secondary | ICD-10-CM | POA: Diagnosis not present

## 2015-10-15 LAB — PSA

## 2015-10-25 ENCOUNTER — Encounter: Payer: Self-pay | Admitting: Family Medicine

## 2015-11-27 ENCOUNTER — Encounter: Payer: Self-pay | Admitting: Family Medicine

## 2015-11-27 ENCOUNTER — Ambulatory Visit (INDEPENDENT_AMBULATORY_CARE_PROVIDER_SITE_OTHER): Payer: Medicare Other | Admitting: Family Medicine

## 2015-11-27 VITALS — BP 146/88 | HR 67 | Temp 98.4°F | Resp 18 | Wt 165.1 lb

## 2015-11-27 DIAGNOSIS — E7800 Pure hypercholesterolemia, unspecified: Secondary | ICD-10-CM | POA: Diagnosis not present

## 2015-11-27 DIAGNOSIS — I10 Essential (primary) hypertension: Secondary | ICD-10-CM | POA: Diagnosis not present

## 2015-11-27 NOTE — Progress Notes (Signed)
OFFICE VISIT  11/27/2015   CC:  Chief Complaint  Patient presents with  . Follow-up    HTN   HPI:    Patient is a 70 y.o. Caucasian male who presents for 3 mo f/u HTN, HLD.  He has almost completely quit smoking.  BP: compliant with bp med.  BP's range 125-140/70s. He is still working but may retire next week.  HLD: chol increased 07/2015, pt declined statin, has been working on diet changes. Insomnia: stress related.  Xanax dosing every night is very helpful for this.  Next f/u with [name] is Feb or Mar 2018--PSA surveillance s/p prostate ca treatment.   Past Medical History:  Diagnosis Date  . Anxiety   . Atrial fibrillation ([place])    Limited episode, emergency room, June, 2013, spontaneous conversion to sinus rhythm, was evaluated By [name] 2013- no further visits.  [name] BPH (benign prostatic hypertrophy) with urinary obstruction    Nocturia is his primary symptom.  Acute urinary retention occurred when he got CT with contrast fall 2016 ([name]).  . COPD (chronic obstructive pulmonary disease) ([place]) fall 2016   Bullous changes noted on lower lung images of CT abd done by urology  . Diverticulitis   . GERD (gastroesophageal reflux disease)   . Has received pneumococcal vaccination   . Hyperlipidemia    statin intolerant  . Hypertension   . Microscopic hematuria Fall 2016   CT nl except nonobstructing stones.  Cystoscopy normal 12/30/14 ([name])  . Nephrolithiasis    11 mm stone on R, 2 mm stone on L, ureters clear  . 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  Urol ([name]) at [facility]; [name] referred him to [name] 03/2015: patient got bilat nerve sparing , robot assisted laparoscopic radical prostatectomy and pelvic lymphadenectomy.  As of 10/21/15 urol f/u, PSA undetectable, pt chose close PSA surveillance over adjuvant radiation tx.  . Tobacco dependence    down to 1-2 cigs per day as of 11/2015  .  Urinary retention 05/2015   occurred s/p foley removal    Past Surgical History:  Procedure Laterality Date  . aortic ultrasound  07/2012   NO AAA  . COLONOSCOPY  approx 2011 per pt   Done by surgeon in [place] after a bout of diverticulitis; normal per pt report--was told to repeat in 10 yrs.  . INGUINAL HERNIA REPAIR  2003 and 2004   Both sides.  [name] LITHOTRIPSY  2004   Dr. Maryland Pink in [place].  [name] LYMPHADENECTOMY Bilateral 04/30/2015   Procedure: PELVIC LYMPHADENECTOMY;  Surgeon: [name], MD;  [facility];  Service: Urology;  Laterality: Bilateral;  . PROSTATE BIOPSY  02/05/15   Prostate adenocarcinoma: pt considering treatment options as of 02/19/15  . ROBOT ASSISTED LAPAROSCOPIC RADICAL PROSTATECTOMY N/A 04/30/2015   Procedure: XI ROBOTIC ASSISTED LAPAROSCOPIC RADICAL PROSTATECTOMY LEVEL 2;  Surgeon: [name], MD;  [facility];  Service: Urology;  Laterality: N/A;  . TRANSTHORACIC ECHOCARDIOGRAM  03/22/2006   EF 65%, normal valves, no wall motion abnormalties, LA size normal    Outpatient Medications Prior to Visit  Medication Sig Dispense Refill  . ALPRAZolam (XANAX) 0.25 MG tablet 1 tab po qhs prn 30 tablet 2  . aspirin 81 MG tablet Take 81 mg by mouth. Every other day    . benazepril (LOTENSIN) 40 MG tablet 1/2 tab po qAM and 1 tab po qPM 135 tablet 2  .  hydrochlorothiazide (HYDRODIURIL) 25 MG tablet 1/2 tab po qd 15 tablet 6  . Omega-3 Fatty Acids (FISH OIL) 500 MG CAPS Take 1 capsule by mouth daily.    . Vitamin D, Cholecalciferol, 400 units CAPS Take 1 capsule by mouth daily.     No facility-administered medications prior to visit.     Allergies  Allergen Reactions  . Amlodipine     LE swelling  . Contrast Media [Iodinated Diagnostic Agents]     Prostate problem had to wear a catheter for two weeks after having dye.   [name] Hydralazine Palpitations    Fatigue+    ROS As per HPI  PE: Blood pressure (!) 146/88, pulse 67, temperature 98.4 F (36.9 C),  temperature source Temporal, resp. rate 18, weight 165 lb 1.9 oz (74.9 kg), SpO2 98 %. Gen: Alert, well appearing.  Patient is oriented to person, place, time, and situation. AFFECT: pleasant, lucid thought and speech. CV: RRR, no m/r/g.   LUNGS: CTA bilat, nonlabored resps, good aeration in all lung fields.   LABS:    Chemistry      Component Value Date/Time   NA 142 07/20/2015 0825   K 4.7 07/20/2015 0825   CL 105 07/20/2015 0825   CO2 27 07/20/2015 0825   BUN 22 07/20/2015 0825   CREATININE 0.85 07/20/2015 0825      Component Value Date/Time   CALCIUM 10.0 07/20/2015 0825   ALKPHOS 80 07/20/2015 0825   AST 15 07/20/2015 0825   ALT 12 07/20/2015 0825   BILITOT 0.6 07/20/2015 0825       IMPRESSION AND PLAN:  1) HTN; The current medical regimen is effective;  continue present plan and medications. Lytes/cr good 07/2015. Will repeat this lab in 4 mo.  2) Hyperlipidemia: pt working on TLC.  Will repeat FLP at next visit in 4 mo.  3) Prost ca: remission.  Keep urology f/u for surveillance.  An After Visit Summary was printed and given to the patient.  FOLLOW UP: Return in about 4 months (around 03/29/2016) for annual CPE (fasting).  Signed:  Crissie Sickles, MD           11/27/2015

## 2015-11-27 NOTE — Progress Notes (Signed)
Pre visit review using our clinic review tool, if applicable. No additional management support is needed unless otherwise documented below in the visit note. 

## 2015-11-30 ENCOUNTER — Other Ambulatory Visit: Payer: Self-pay | Admitting: Family Medicine

## 2016-01-20 ENCOUNTER — Other Ambulatory Visit: Payer: Self-pay | Admitting: Family Medicine

## 2016-01-20 NOTE — Telephone Encounter (Signed)
RF request for alprazolam LOV: 11/27/15 Next ov: 03/30/16 Last written: 07/22/15 #30 w/ 2RF  Please advise. Thanks.

## 2016-01-21 NOTE — Telephone Encounter (Signed)
Rx faxed

## 2016-03-28 ENCOUNTER — Telehealth: Payer: Self-pay | Admitting: Family Medicine

## 2016-03-28 NOTE — Telephone Encounter (Signed)
Patient wanted to make sure that the lab [facility] uses is not [facility]. 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 Can we use [facility] if MD does not already for his CPE appointment this Wednesday. Okay to leave detailed message on phone.   Call patient at [phone] to advise.

## 2016-03-28 NOTE — Telephone Encounter (Signed)
Left detailed message on phone as requested below.

## 2016-03-30 ENCOUNTER — Encounter: Payer: Self-pay | Admitting: Family Medicine

## 2016-03-30 ENCOUNTER — Ambulatory Visit (INDEPENDENT_AMBULATORY_CARE_PROVIDER_SITE_OTHER): Payer: Medicare Other | Admitting: Family Medicine

## 2016-03-30 VITALS — BP 147/94 | HR 54 | Temp 98.0°F | Resp 16 | Ht 69.5 in | Wt 162.5 lb

## 2016-03-30 DIAGNOSIS — H6123 Impacted cerumen, bilateral: Secondary | ICD-10-CM

## 2016-03-30 DIAGNOSIS — Z1159 Encounter for screening for other viral diseases: Secondary | ICD-10-CM | POA: Diagnosis not present

## 2016-03-30 DIAGNOSIS — Z9189 Other specified personal risk factors, not elsewhere classified: Secondary | ICD-10-CM | POA: Diagnosis not present

## 2016-03-30 DIAGNOSIS — Z0000 Encounter for general adult medical examination without abnormal findings: Secondary | ICD-10-CM

## 2016-03-30 DIAGNOSIS — Z0001 Encounter for general adult medical examination with abnormal findings: Secondary | ICD-10-CM | POA: Diagnosis not present

## 2016-03-30 LAB — COMPREHENSIVE METABOLIC PANEL
ALK PHOS: 68 U/L (ref 39–117)
ALT: 16 U/L (ref 0–53)
AST: 25 U/L (ref 0–37)
Albumin: 4.4 g/dL (ref 3.5–5.2)
BUN: 16 mg/dL (ref 6–23)
CO2: 30 meq/L (ref 19–32)
Calcium: 9.9 mg/dL (ref 8.4–10.5)
Chloride: 104 mEq/L (ref 96–112)
Creatinine, Ser: 0.87 mg/dL (ref 0.40–1.50)
GFR: 91.96 mL/min (ref >60.00)
GLUCOSE: 92 mg/dL (ref 70–99)
POTASSIUM: 4.3 meq/L (ref 3.5–5.1)
SODIUM: 137 meq/L (ref 135–145)
TOTAL PROTEIN: 6.8 g/dL (ref 6.0–8.3)
Total Bilirubin: 0.8 mg/dL (ref 0.2–1.2)

## 2016-03-30 LAB — LIPID PANEL
Cholesterol: 163 mg/dL (ref 0–200)
HDL: 34.5 mg/dL — ABNORMAL LOW (ref >39.00)
LDL Cholesterol: 112 mg/dL — ABNORMAL HIGH (ref 0–99)
NONHDL: 128.28
Total CHOL/HDL Ratio: 5
Triglycerides: 83 mg/dL (ref 0.0–149.0)
VLDL: 16.6 mg/dL (ref 0.0–40.0)

## 2016-03-30 NOTE — Progress Notes (Signed)
Pre visit review using our clinic review tool, if applicable. No additional management support is needed unless otherwise documented below in the visit note. 

## 2016-03-30 NOTE — Progress Notes (Signed)
Office Note 03/30/2016  CC:  Chief Complaint  Patient presents with  . Annual Exam    Pt is fasting.    HPI:  Daniel [name]. is a 71 y.o.  male who is here for annual health maintenance exam. He says he feels well. Eye exam: 1 yr ago. Dental: he has dentures.  Home bp monitoring shows consistently 125-135/70-75.  He is still smoking 1 pack cigs per week.  Still getting PSA surveillance s/p tx for prostate cancer: [name].   Past Medical History:  Diagnosis Date  . Anxiety   . Atrial fibrillation ([place])    Limited episode, emergency room, June, 2013, spontaneous conversion to sinus rhythm, was evaluated By [name] 2013- no further visits.  [name] BPH (benign prostatic hypertrophy) with urinary obstruction    Nocturia is his primary symptom.  Acute urinary retention occurred when he got CT with contrast fall 2016 ([name]).  . COPD (chronic obstructive pulmonary disease) ([place]) fall 2016   Bullous changes noted on lower lung images of CT abd done by urology  . Diverticulitis   . GERD (gastroesophageal reflux disease)   . Has received pneumococcal vaccination   . Hyperlipidemia    statin intolerant  . Hypertension   . Microscopic hematuria Fall 2016   CT nl except nonobstructing stones.  Cystoscopy normal 12/30/14 ([name])  . Nephrolithiasis    11 mm stone on R, 2 mm stone on L, ureters clear  . 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  Urol ([name]) at [facility]; [name] referred him to [name] 03/2015: patient got bilat nerve sparing , robot assisted laparoscopic radical prostatectomy and pelvic lymphadenectomy.  As of 10/21/15 urol f/u, PSA undetectable, pt chose close PSA surveillance over adjuvant radiation tx.  . Tobacco dependence    down to 1-2 cigs per day as of 11/2015  . Urinary retention 05/2015   occurred s/p foley removal    Past Surgical History:  Procedure Laterality Date  . aortic  ultrasound  07/2012   NO AAA  . COLONOSCOPY  approx 2011 per pt   Done by surgeon in [place] after a bout of diverticulitis; normal per pt report--was told to repeat in 10 yrs.  . INGUINAL HERNIA REPAIR  2003 and 2004   Both sides.  [name] LITHOTRIPSY  2004   Dr. Maryland Pink in [place].  [name] LYMPHADENECTOMY Bilateral 04/30/2015   Procedure: PELVIC LYMPHADENECTOMY;  Surgeon: [name], MD;  Location: [facility];  Service: Urology;  Laterality: Bilateral;  . PROSTATE BIOPSY  02/05/15   Prostate adenocarcinoma: pt considering treatment options as of 02/19/15  . ROBOT ASSISTED LAPAROSCOPIC RADICAL PROSTATECTOMY N/A 04/30/2015   Procedure: XI ROBOTIC ASSISTED LAPAROSCOPIC RADICAL PROSTATECTOMY LEVEL 2;  Surgeon: [name], MD;  Location: [facility];  Service: Urology;  Laterality: N/A;  . TRANSTHORACIC ECHOCARDIOGRAM  03/22/2006   EF 65%, normal valves, no wall motion abnormalties, LA size normal    Family History  Problem Relation Age of Onset  . Hypertension Mother   . Cancer - Other Mother 51    breast  . Cancer Father     Lung ca age 60  . Diabetes Sister     Social History   Social History  . Marital status: Married    Spouse name: N/A  . Number of children: N/A  . Years of education: N/A   Occupational History  . Not on file.  Social History Main Topics  . Smoking status: Current Every Day Smoker    Packs/day: 1.00    Years: 55.00    Types: Cigarettes  . Smokeless tobacco: Never Used  . Alcohol use Yes     Comment: rare  . Drug use: No  . Sexual activity: Not on file   Other Topics Concern  . Not on file   Social History Narrative   Married, 2 grown children.   HS education.  Orig from [place], lives there now.   Occupation: maintenance.  Drives a tractor a lot, drives a pickup truck a lot, rides horses a lot.   Tobacco: 20 pack-yr hx (current as of 07/2012)   No drugs.   Alcohol: none    Outpatient Medications Prior to Visit  Medication Sig Dispense Refill  . ALPRAZolam  (XANAX) 0.25 MG tablet TAKE ONE TABLET BY MOUTH AT BEDTIME AS NEEDED 30 tablet 3  . aspirin 81 MG tablet Take 81 mg by mouth. Every other day    . benazepril (LOTENSIN) 40 MG tablet TAKE 1/2 TABLET BY MOUTH EVERY MORNING AND TAKE ONE TABLET BY MOUTH EVERY EVENING 135 tablet 1  . hydrochlorothiazide (HYDRODIURIL) 25 MG tablet 1/2 tab po qd 15 tablet 6  . Omega-3 Fatty Acids (FISH OIL) 500 MG CAPS Take 1 capsule by mouth daily.    . Vitamin D, Cholecalciferol, 400 units CAPS Take 1 capsule by mouth daily.     No facility-administered medications prior to visit.     Allergies  Allergen Reactions  . Amlodipine     LE swelling  . Contrast Media [Iodinated Diagnostic Agents]     Prostate problem had to wear a catheter for two weeks after having dye.   [name] Hydralazine Palpitations    Fatigue+    ROS Review of Systems  Constitutional: Negative for appetite change, chills, fatigue and fever.  HENT: Negative for congestion, dental problem, ear pain and sore throat.   Eyes: Negative for discharge, redness and visual disturbance.  Respiratory: Negative for cough, chest tightness, shortness of breath and wheezing.   Cardiovascular: Negative for chest pain, palpitations and leg swelling.  Gastrointestinal: Negative for abdominal pain, blood in stool, diarrhea, nausea and vomiting.  Genitourinary: Negative for difficulty urinating, dysuria, flank pain, frequency, hematuria and urgency.  Musculoskeletal: Negative for arthralgias, back pain, joint swelling, myalgias and neck stiffness.  Skin: Negative for pallor and rash.  Neurological: Negative for dizziness, speech difficulty, weakness and headaches.       Slight decreased hearing AU lately  Endo/Heme/Allergies: Negative for adenopathy. Does not bruise/bleed easily.  Psychiatric/Behavioral: Negative for confusion and sleep disturbance. The patient is not nervous/anxious.    Review of Systems  Constitutional: Negative for appetite change, chills,  fatigue and fever.  HENT: Negative for congestion, dental problem, ear pain and sore throat.   Eyes: Negative for discharge, redness and visual disturbance.  Respiratory: Negative for cough, chest tightness, shortness of breath and wheezing.   Cardiovascular: Negative for chest pain, palpitations and leg swelling.  Gastrointestinal: Negative for abdominal pain, blood in stool, diarrhea, nausea and vomiting.  Genitourinary: Negative for difficulty urinating, dysuria, flank pain, frequency, hematuria and urgency.  Musculoskeletal: Negative for arthralgias, back pain, joint swelling, myalgias and neck stiffness.  Skin: Negative for pallor and rash.  Neurological: Negative for dizziness, speech difficulty, weakness and headaches.       Slight decreased hearing AU lately  Hematological: Negative for adenopathy. Does not bruise/bleed easily.  Psychiatric/Behavioral: Negative for confusion  and sleep disturbance. The patient is not nervous/anxious.    PE; Blood pressure (!) 147/94, pulse (!) 54, temperature 98 F (36.7 C), temperature source Oral, resp. rate 16, height 5' 9.5" (1.765 m), weight 162 lb 8 oz (73.7 kg), SpO2 99 %. Gen: Alert, well appearing.  Patient is oriented to person, place, time, and situation. AFFECT: pleasant, lucid thought and speech. ENT: Ears: EACs completely blocked by cerumen bilat.  Eyes: no injection, icteris, swelling, or exudate.  EOMI, PERRLA. Nose: no drainage or turbinate edema/swelling.  No injection or focal lesion.  Mouth: lips without lesion/swelling.  Oral mucosa pink and moist.  He has dentures upper and lower, gingiva normal. Oropharynx without erythema, exudate, or swelling.  Neck: supple/nontender.  No LAD, mass, or TM.  Carotid pulses 2+ bilaterally, without bruits. CV: RRR, no m/r/g.   LUNGS: CTA bilat, nonlabored resps, good aeration in all lung fields. ABD: soft, NT, ND, BS normal.  No hepatospenomegaly or mass.  No bruits.  His abd aortic pulsations are  easily palpable EXT: no clubbing, cyanosis, or edema.  Musculoskeletal: no joint swelling, erythema, warmth, or tenderness.  ROM of all joints intact. Skin - no sores or suspicious lesions or rashes or color changes   Pertinent labs:  Lab Results  Component Value Date   TSH 0.96 08/16/2012   Lab Results  Component Value Date   WBC 6.2 04/27/2015   HGB 13.7 05/01/2015   HCT 40.2 05/01/2015   MCV 92.6 04/27/2015   PLT 161 04/27/2015   Lab Results  Component Value Date   CREATININE 0.85 07/20/2015   BUN 22 07/20/2015   NA 142 07/20/2015   K 4.7 07/20/2015   CL 105 07/20/2015   CO2 27 07/20/2015   Lab Results  Component Value Date   ALT 12 07/20/2015   AST 15 07/20/2015   ALKPHOS 80 07/20/2015   BILITOT 0.6 07/20/2015   Lab Results  Component Value Date   CHOL 172 07/20/2015   Lab Results  Component Value Date   HDL 34.70 (L) 07/20/2015   Lab Results  Component Value Date   LDLCALC 121 (H) 07/20/2015   Lab Results  Component Value Date   TRIG 82.0 07/20/2015   Lab Results  Component Value Date   CHOLHDL 5 07/20/2015   Lab Results  Component Value Date   PSA 17.82 (H) 12/22/2014   PSA 13.92 (H) 11/14/2014   PSA 14.95 (H) 08/28/2014    ASSESSMENT AND PLAN:   1) Health maintenance exam: Reviewed age and gender appropriate health maintenance issues (prudent diet, regular exercise, health risks of tobacco and excessive alcohol, use of seatbelts, fire alarms in home, use of sunscreen).  Also reviewed age and gender appropriate health screening as well as vaccine recommendations. He declines flu and prevnar 13 vaccines today. Labs today: CMET, FLP, hep c screening. Prostate cancer screening: N/A due to dx of prostate cancer, being managed/followed by urologist. Colon cancer screening: next colonoscopy due 2021.  2) Cerumen impaction AU: irrigated by nurse today and completely cleared of cerumen. Both EACs and TMs appeared normal after this.  An After  Visit Summary was printed and given to the patient.  FOLLOW UP:  Return in about 6 months (around 09/27/2016) for routine chronic illness f/u.  Signed:  Crissie Sickles, MD           03/30/2016

## 2016-03-31 ENCOUNTER — Encounter: Payer: Self-pay | Admitting: Family Medicine

## 2016-03-31 LAB — HEPATITIS C ANTIBODY: HCV AB: NEGATIVE

## 2016-04-27 ENCOUNTER — Encounter: Payer: Self-pay | Admitting: Family Medicine

## 2016-04-27 ENCOUNTER — Other Ambulatory Visit: Payer: Self-pay | Admitting: Family Medicine

## 2016-04-27 NOTE — Telephone Encounter (Signed)
[place].  RF request for benazepril LOV: 03/30/16 Next ov: 09/27/16 Last written: 11/30/15 #135 w/ 1RF

## 2016-05-01 DIAGNOSIS — I255 Ischemic cardiomyopathy: Secondary | ICD-10-CM

## 2016-05-01 DIAGNOSIS — I214 Non-ST elevation (NSTEMI) myocardial infarction: Secondary | ICD-10-CM

## 2016-05-01 DIAGNOSIS — I2510 Atherosclerotic heart disease of native coronary artery without angina pectoris: Secondary | ICD-10-CM

## 2016-05-01 HISTORY — DX: Atherosclerotic heart disease of native coronary artery without angina pectoris: I25.10

## 2016-05-01 HISTORY — DX: Ischemic cardiomyopathy: I25.5

## 2016-05-01 HISTORY — DX: Non-ST elevation (NSTEMI) myocardial infarction: I21.4

## 2016-05-07 ENCOUNTER — Emergency Department (HOSPITAL_BASED_OUTPATIENT_CLINIC_OR_DEPARTMENT_OTHER): Payer: Medicare Other

## 2016-05-07 ENCOUNTER — Encounter (HOSPITAL_BASED_OUTPATIENT_CLINIC_OR_DEPARTMENT_OTHER): Payer: Self-pay | Admitting: Emergency Medicine

## 2016-05-07 ENCOUNTER — Emergency Department (HOSPITAL_BASED_OUTPATIENT_CLINIC_OR_DEPARTMENT_OTHER)
Admission: EM | Admit: 2016-05-07 | Discharge: 2016-05-07 | Disposition: A | Discharge status: Home / Self Care | Payer: Medicare Other | Source: Home / Self Care | Attending: Emergency Medicine | Admitting: Emergency Medicine

## 2016-05-07 DIAGNOSIS — R079 Chest pain, unspecified: Secondary | ICD-10-CM | POA: Diagnosis not present

## 2016-05-07 DIAGNOSIS — D696 Thrombocytopenia, unspecified: Secondary | ICD-10-CM | POA: Diagnosis not present

## 2016-05-07 DIAGNOSIS — Z720 Tobacco use: Secondary | ICD-10-CM | POA: Diagnosis not present

## 2016-05-07 DIAGNOSIS — I480 Paroxysmal atrial fibrillation: Secondary | ICD-10-CM | POA: Diagnosis not present

## 2016-05-07 DIAGNOSIS — F17210 Nicotine dependence, cigarettes, uncomplicated: Secondary | ICD-10-CM

## 2016-05-07 DIAGNOSIS — R109 Unspecified abdominal pain: Secondary | ICD-10-CM | POA: Diagnosis not present

## 2016-05-07 DIAGNOSIS — Z8546 Personal history of malignant neoplasm of prostate: Secondary | ICD-10-CM

## 2016-05-07 DIAGNOSIS — I493 Ventricular premature depolarization: Secondary | ICD-10-CM | POA: Diagnosis not present

## 2016-05-07 DIAGNOSIS — I10 Essential (primary) hypertension: Secondary | ICD-10-CM | POA: Insufficient documentation

## 2016-05-07 DIAGNOSIS — Z7982 Long term (current) use of aspirin: Secondary | ICD-10-CM

## 2016-05-07 DIAGNOSIS — I214 Non-ST elevation (NSTEMI) myocardial infarction: Secondary | ICD-10-CM | POA: Diagnosis not present

## 2016-05-07 DIAGNOSIS — E785 Hyperlipidemia, unspecified: Secondary | ICD-10-CM | POA: Diagnosis not present

## 2016-05-07 DIAGNOSIS — N201 Calculus of ureter: Secondary | ICD-10-CM

## 2016-05-07 DIAGNOSIS — E8770 Fluid overload, unspecified: Secondary | ICD-10-CM | POA: Diagnosis not present

## 2016-05-07 DIAGNOSIS — I2510 Atherosclerotic heart disease of native coronary artery without angina pectoris: Secondary | ICD-10-CM | POA: Diagnosis not present

## 2016-05-07 DIAGNOSIS — I25110 Atherosclerotic heart disease of native coronary artery with unstable angina pectoris: Secondary | ICD-10-CM | POA: Diagnosis not present

## 2016-05-07 DIAGNOSIS — Z87442 Personal history of urinary calculi: Secondary | ICD-10-CM

## 2016-05-07 DIAGNOSIS — N400 Enlarged prostate without lower urinary tract symptoms: Secondary | ICD-10-CM | POA: Diagnosis not present

## 2016-05-07 DIAGNOSIS — J449 Chronic obstructive pulmonary disease, unspecified: Secondary | ICD-10-CM | POA: Insufficient documentation

## 2016-05-07 DIAGNOSIS — J90 Pleural effusion, not elsewhere classified: Secondary | ICD-10-CM | POA: Diagnosis not present

## 2016-05-07 DIAGNOSIS — R1031 Right lower quadrant pain: Secondary | ICD-10-CM

## 2016-05-07 DIAGNOSIS — J9811 Atelectasis: Secondary | ICD-10-CM | POA: Diagnosis not present

## 2016-05-07 DIAGNOSIS — R310 Gross hematuria: Secondary | ICD-10-CM

## 2016-05-07 DIAGNOSIS — I255 Ischemic cardiomyopathy: Secondary | ICD-10-CM | POA: Diagnosis not present

## 2016-05-07 DIAGNOSIS — Z01810 Encounter for preprocedural cardiovascular examination: Secondary | ICD-10-CM | POA: Diagnosis not present

## 2016-05-07 DIAGNOSIS — I2582 Chronic total occlusion of coronary artery: Secondary | ICD-10-CM | POA: Diagnosis not present

## 2016-05-07 DIAGNOSIS — Z79899 Other long term (current) drug therapy: Secondary | ICD-10-CM

## 2016-05-07 LAB — URINALYSIS, ROUTINE W REFLEX MICROSCOPIC

## 2016-05-07 LAB — BASIC METABOLIC PANEL
ANION GAP: 7 (ref 5–15)
BUN: 18 mg/dL (ref 6–20)
CALCIUM: 9.2 mg/dL (ref 8.9–10.3)
CO2: 25 mmol/L (ref 22–32)
CREATININE: 0.99 mg/dL (ref 0.61–1.24)
Chloride: 105 mmol/L (ref 101–111)
GFR calc non Af Amer: >60 mL/min (ref >60)
GLUCOSE: 94 mg/dL (ref 65–99)
Potassium: 4.1 mmol/L (ref 3.5–5.1)
Sodium: 137 mmol/L (ref 135–145)

## 2016-05-07 LAB — CBC
HEMATOCRIT: 43.8 % (ref 39.0–52.0)
Hemoglobin: 15.2 g/dL (ref 13.0–17.0)
MCH: 32.4 pg (ref 26.0–34.0)
MCHC: 34.7 g/dL (ref 30.0–36.0)
MCV: 93.4 fL (ref 78.0–100.0)
PLATELETS: 170 10*3/uL (ref 150–400)
RBC: 4.69 MIL/uL (ref 4.22–5.81)
RDW: 13.6 % (ref 11.5–15.5)
WBC: 6.4 10*3/uL (ref 4.0–10.5)

## 2016-05-07 LAB — URINALYSIS, MICROSCOPIC (REFLEX)

## 2016-05-07 MED ORDER — OXYCODONE-ACETAMINOPHEN 5-325 MG PO TABS: 1.0000 | ORAL_TABLET | Freq: Four times a day (QID) | ORAL | 0 refills | Status: DC | PRN

## 2016-05-07 MED ORDER — ONDANSETRON HCL 4 MG PO TABS: 4.0000 mg | ORAL_TABLET | Freq: Four times a day (QID) | ORAL | 0 refills | Status: DC

## 2016-05-07 MED ORDER — TAMSULOSIN HCL 0.4 MG PO CAPS: 0.4000 mg | ORAL_CAPSULE | Freq: Every day | ORAL | 0 refills | Status: DC

## 2016-05-07 NOTE — ED Triage Notes (Signed)
Pt c/o blood in urine since this morning.  States slight discomfort in bladder for past week.  Denies any back pain.

## 2016-05-07 NOTE — Discharge Instructions (Signed)
Return to the ED with any concerns including fever/chills, vomiting and not able to keep down liquids or pain medications, pain that is not controlled by taking pain medications, decreased level of alertness/lethargy, or any other alarming symptoms  You should be sure to followup with your primary care doctor to have your blood pressure rechecked in the next week

## 2016-05-07 NOTE — ED Provider Notes (Signed)
[facility] DEPT Provider Note   CSN: [id number] Arrival date & time: 05/07/16  1148     History   Chief Complaint Chief Complaint  Patient presents with  . Hematuria    HPI Pete Schnitzer. is a 71 y.o. male.  HPI  Pt presenting with c/o gross hematuria.  He had an episode of bloody urine just prior to coming to the ED today.  No blood clots, no difficulty urinating.  No dysuria.  He does not take any blood thinners.  Since arriving to the ED he has urinated again without any difficulty.  No fever/chills.  No nausea or vomiting.  After episode of hematuria he felt some right sided groin pain.  He has hx of prior kidney stones.  There are no other associated systemic symptoms, there are no other alleviating or modifying factors.   Past Medical History:  Diagnosis Date  . Anxiety   . Atrial fibrillation ([place])    Limited episode, emergency room, June, 2013, spontaneous conversion to sinus rhythm, was evaluated By [name] 2013- no further visits.  [name] BPH (benign prostatic hypertrophy) with urinary obstruction    Nocturia is his primary symptom.  Acute urinary retention occurred when he got CT with contrast fall 2016 ([name]).  . COPD (chronic obstructive pulmonary disease) ([place]) fall 2016   Bullous changes noted on lower lung images of CT abd done by urology  . Diverticulitis   . GERD (gastroesophageal reflux disease)   . Has received pneumococcal vaccination   . Hyperlipidemia    statin intolerant  . Hypertension   . Microscopic hematuria Fall 2016   CT nl except nonobstructing stones.  Cystoscopy normal 12/30/14 ([name])  . Nephrolithiasis    11 mm stone on R, 2 mm stone on L, ureters clear  . 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  Urol ([name]) at [facility]; [name] referred him to [name] 03/2015: patient got bilat nerve sparing , robot assisted laparoscopic radical prostatectomy and pelvic lymphadenectomy.   As of 10/21/15 urol f/u, PSA undetectable (repeat 04/22/16 result pending as per urol f/u that day), pt chose close PSA surveillance over adjuvant radiation tx.  . Tobacco dependence    down to 1-2 cigs per day as of 11/2015  . Urinary retention 05/2015   occurred s/p foley removal    Patient Active Problem List   Diagnosis Date Noted  . Prostate cancer ([place]) 04/30/2015  . UTI (urinary tract infection) 11/14/2014  . Hyperlipidemia 10/30/2013  . Acute prostatitis 06/19/2013  . Uncontrolled hypertension 03/01/2013  . Tobacco dependence 03/01/2013  . Elevated PSA 03/01/2013  . BPH (benign prostatic hypertrophy) with urinary obstruction 08/30/2012  . GERD (gastroesophageal reflux disease) 08/30/2012  . HTN (hypertension), benign 08/30/2012  . Encounter to establish care with new doctor 08/30/2012  . Tinnitus of both ears 08/30/2012  . Health maintenance examination 08/30/2012  . Palpable abdominal aorta 08/30/2012  . Atrial fibrillation ([place])   . Ejection fraction   . Hypertension   . Anxiety     Past Surgical History:  Procedure Laterality Date  . aortic ultrasound  07/2012   NO AAA  . COLONOSCOPY  approx 2011 per pt   Done by surgeon in [place] after a bout of diverticulitis; normal per pt report--was told to repeat in 10 yrs.  . INGUINAL HERNIA REPAIR  2003 and 2004   Both sides.  [name] LITHOTRIPSY  2004  Dr. Maryland Pink in [place].  [name] LYMPHADENECTOMY Bilateral 04/30/2015   Procedure: PELVIC LYMPHADENECTOMY;  Surgeon: [name], MD;  Location: [facility];  Service: Urology;  Laterality: Bilateral;  . PROSTATE BIOPSY  02/05/15   Prostate adenocarcinoma: pt considering treatment options as of 02/19/15  . ROBOT ASSISTED LAPAROSCOPIC RADICAL PROSTATECTOMY N/A 04/30/2015   Procedure: XI ROBOTIC ASSISTED LAPAROSCOPIC RADICAL PROSTATECTOMY LEVEL 2;  Surgeon: [name], MD;  Location: [facility];  Service: Urology;  Laterality: N/A;  . TRANSTHORACIC ECHOCARDIOGRAM  03/22/2006   EF 65%, normal valves,  no wall motion abnormalties, LA size normal       Home Medications    Prior to Admission medications   Medication Sig Start Date End Date Taking? Authorizing Provider  ALPRAZolam Duanne Moron) 0.25 MG tablet TAKE ONE TABLET BY MOUTH AT BEDTIME AS NEEDED 01/20/16  Yes [name], MD  aspirin 81 MG tablet Take 81 mg by mouth. Every other day   Yes Historical Provider, MD  benazepril (LOTENSIN) 40 MG tablet TAKE 1/2 TABLET BY MOUTH EVERY MORNING AND TAKE ONE TABLET EVERY EVENING 04/27/16  Yes [name], MD  hydrochlorothiazide (HYDRODIURIL) 25 MG tablet 1/2 tab po qd 08/03/15  Yes [name], MD  Omega-3 Fatty Acids (FISH OIL) 500 MG CAPS Take 1 capsule by mouth daily.   Yes Historical Provider, MD  Vitamin D, Cholecalciferol, 400 units CAPS Take 1 capsule by mouth daily.   Yes Historical Provider, MD  ondansetron (ZOFRAN) 4 MG tablet Take 1 tablet (4 mg total) by mouth every 6 (six) hours. 05/07/16   Alfonzo Beers, MD  oxyCODONE-acetaminophen (PERCOCET/ROXICET) 5-325 MG tablet Take 1-2 tablets by mouth every 6 (six) hours as needed for severe pain. 05/07/16   Alfonzo Beers, MD  tamsulosin (FLOMAX) 0.4 MG CAPS capsule Take 1 capsule (0.4 mg total) by mouth daily. 05/07/16   Alfonzo Beers, MD    Family History Family History  Problem Relation Age of Onset  . Hypertension Mother   . Cancer - Other Mother 41    breast  . Cancer Father     Lung ca age 48  . Diabetes Sister     Social History Social History  Substance Use Topics  . Smoking status: Current Every Day Smoker    Packs/day: 1.00    Years: 55.00    Types: Cigarettes  . Smokeless tobacco: Never Used  . Alcohol use No     Allergies   Amlodipine; Contrast media [iodinated diagnostic agents]; and Hydralazine   Review of Systems Review of Systems  ROS reviewed and all otherwise negative except for mentioned in HPI   Physical Exam Updated Vital Signs BP (!) 216/97 Comment: Simultaneous filing. User may not have  seen previous data.  Pulse (!) 50 Comment: Simultaneous filing. User may not have seen previous data.  Temp 97.9 F (36.6 C) (Oral)   Resp 16   Ht 5\' 10"  (1.778 m)   Wt 73.5 kg   SpO2 100% Comment: Simultaneous filing. User may not have seen previous data.  BMI 23.24 kg/m  Vitals reviewed Physical Exam Physical Examination: General appearance - alert, well appearing, and in no distress Mental status - alert, oriented to person, place, and time Eyes - no conjunctival injection, no scleral icterus Chest - clear to auscultation, no wheezes, rales or rhonchi, symmetric air entry Heart - normal rate, regular rhythm, normal S1, S2, no murmurs, rubs, clicks or gallops Abdomen - soft, nontender, nondistended, no masses or organomegaly Back exam -  no CVA tenderness Neurological - alert, oriented, normal speech Extremities - peripheral pulses normal, no pedal edema, no clubbing or cyanosis Skin - normal coloration and turgor, no rashes  ED Treatments / Results  Labs (all labs ordered are listed, but only abnormal results are displayed) Labs Reviewed  URINALYSIS, ROUTINE W REFLEX MICROSCOPIC - Abnormal; Notable for the following:       Result Value   Color, Urine RED (*)    APPearance TURBID (*)    Glucose, UA   (*)    Value: TEST NOT REPORTED DUE TO COLOR INTERFERENCE OF URINE PIGMENT   Hgb urine dipstick   (*)    Value: TEST NOT REPORTED DUE TO COLOR INTERFERENCE OF URINE PIGMENT   Bilirubin Urine   (*)    Value: TEST NOT REPORTED DUE TO COLOR INTERFERENCE OF URINE PIGMENT   Ketones, ur   (*)    Value: TEST NOT REPORTED DUE TO COLOR INTERFERENCE OF URINE PIGMENT   Protein, ur   (*)    Value: TEST NOT REPORTED DUE TO COLOR INTERFERENCE OF URINE PIGMENT   Nitrite   (*)    Value: TEST NOT REPORTED DUE TO COLOR INTERFERENCE OF URINE PIGMENT   Leukocytes, UA   (*)    Value: TEST NOT REPORTED DUE TO COLOR INTERFERENCE OF URINE PIGMENT   All other components within normal limits    URINALYSIS, MICROSCOPIC (REFLEX) - Abnormal; Notable for the following:    Bacteria, UA MANY (*)    Squamous Epithelial / LPF 0-5 (*)    All other components within normal limits  URINE CULTURE  CBC  BASIC METABOLIC PANEL    EKG  EKG Interpretation None       Radiology Ct Renal Stone Study  Result Date: 05/07/2016 CLINICAL DATA:  Right flank pain and right groin pain with hematuria today. EXAM: CT ABDOMEN AND PELVIS WITHOUT CONTRAST TECHNIQUE: Multidetector CT imaging of the abdomen and pelvis was performed following the standard protocol without IV contrast. COMPARISON:  None. FINDINGS: Lower chest: Lung bases are within normal. Minimal calcified plaque over the right coronary artery. Hepatobiliary: Normal. Pancreas: Normal. Spleen: Normal. Adrenals/Urinary Tract: Adrenal glands are normal. Kidneys are normal in size with multiple right renal stones the largest measuring 9 mm over the mid to lower pole. Single calcification over the left renal hilum which may be vascular. Mild right-sided hydronephrosis with mild perinephric inflammation/fluid. 1.2 cm cyst over the mid pole right kidney. 3.5 mm stone over the proximal right ureter left ureter is normal. Bladder is within normal. Stomach/Bowel: Stomach and small bowel are within normal. Appendix is normal. Mild to moderate diverticulosis of the colon most prominent over the descending and sigmoid colon. Vascular/Lymphatic: Moderate calcified plaque over the abdominal aorta with minimal dilatation of the distal abdominal aorta measuring 2.8 cm in AP diameter. Moderate calcified plaque over the iliac arteries no adenopathy. Reproductive: Prostatectomy. Other: No free fluid or focal inflammatory change. Evidence of previous inguinal hernia repairs. Postsurgical change over the midline anterior abdominal wall. Musculoskeletal: Mild-to-moderate degenerative change of the spine and hips. IMPRESSION: Right-sided nephrolithiasis. 3.5 mm stone over the  proximal right ureter causing low-grade obstruction. 1.2 cm cyst over the mid pole right kidney. Aortic atherosclerosis with minimal dilatation of the distal abdominal aorta measuring 2.8 cm in AP diameter. Colonic diverticulosis. Previous prostatectomy. Mild atherosclerotic disease of the right coronary artery. Electronically Signed   By: Marin Olp M.D.   On: 05/07/2016 15:29    Procedures Procedures (  including critical care time)  Medications Ordered in ED Medications - No data to display   Initial Impression / Assessment and Plan / ED Course  I have reviewed the triage vital signs and the nursing notes.  Pertinent labs & imaging results that were available during my care of the patient were reviewed by me and considered in my medical decision making (see chart for details).     Pt presenting with c/o gross hematuria just prior to arrival, afterwards developed some groin pain on right side- CT abd shows 3.5 ureteral stone.  Pt has no significant pain in the ED.  Renal function is reassuring.  Pt discharged with pain medications, flomax, urine culture sent.  Pt given followup information for urology.  Discharged with strict return precautions.  Pt agreeable with plan.  Final Clinical Impressions(s) / ED Diagnoses   Final diagnoses:  Right ureteral stone  Gross hematuria    New Prescriptions Discharge Medication List as of 05/07/2016  3:47 PM    START taking these medications   Details  ondansetron (ZOFRAN) 4 MG tablet Take 1 tablet (4 mg total) by mouth every 6 (six) hours., Starting Sat 05/07/2016, Print    oxyCODONE-acetaminophen (PERCOCET/ROXICET) 5-325 MG tablet Take 1-2 tablets by mouth every 6 (six) hours as needed for severe pain., Starting Sat 05/07/2016, Print    tamsulosin (FLOMAX) 0.4 MG CAPS capsule Take 1 capsule (0.4 mg total) by mouth daily., Starting Sat 05/07/2016, Print         Alfonzo Beers, MD 05/08/16 [phone]

## 2016-05-07 NOTE — ED Notes (Signed)
Patient is alert and oriented x3.  He was given DC instructions and follow up visit instructions.  Patient gave verbal understanding.  He was DC ambulatory under his own power to home.  V/S stable.  He was not showing any signs of distress on DC 

## 2016-05-09 ENCOUNTER — Encounter (HOSPITAL_BASED_OUTPATIENT_CLINIC_OR_DEPARTMENT_OTHER): Payer: Self-pay | Admitting: Emergency Medicine

## 2016-05-09 ENCOUNTER — Inpatient Hospital Stay (HOSPITAL_BASED_OUTPATIENT_CLINIC_OR_DEPARTMENT_OTHER)
Admission: EM | Admit: 2016-05-09 | Discharge: 2016-05-17 | DRG: 234 | Disposition: A | Discharge status: Home / Self Care | Payer: Medicare Other | Attending: Thoracic Surgery (Cardiothoracic Vascular Surgery) | Admitting: Thoracic Surgery (Cardiothoracic Vascular Surgery)

## 2016-05-09 ENCOUNTER — Inpatient Hospital Stay (HOSPITAL_COMMUNITY)
Admission: EM | Disposition: A | Discharge status: Home / Self Care | Payer: Self-pay | Source: Home / Self Care | Attending: Thoracic Surgery (Cardiothoracic Vascular Surgery)

## 2016-05-09 DIAGNOSIS — I10 Essential (primary) hypertension: Secondary | ICD-10-CM | POA: Diagnosis present

## 2016-05-09 DIAGNOSIS — J9811 Atelectasis: Secondary | ICD-10-CM | POA: Diagnosis not present

## 2016-05-09 DIAGNOSIS — J449 Chronic obstructive pulmonary disease, unspecified: Secondary | ICD-10-CM | POA: Diagnosis present

## 2016-05-09 DIAGNOSIS — Z79899 Other long term (current) drug therapy: Secondary | ICD-10-CM

## 2016-05-09 DIAGNOSIS — J90 Pleural effusion, not elsewhere classified: Secondary | ICD-10-CM | POA: Diagnosis not present

## 2016-05-09 DIAGNOSIS — I493 Ventricular premature depolarization: Secondary | ICD-10-CM | POA: Diagnosis not present

## 2016-05-09 DIAGNOSIS — I214 Non-ST elevation (NSTEMI) myocardial infarction: Principal | ICD-10-CM

## 2016-05-09 DIAGNOSIS — Z8546 Personal history of malignant neoplasm of prostate: Secondary | ICD-10-CM

## 2016-05-09 DIAGNOSIS — E785 Hyperlipidemia, unspecified: Secondary | ICD-10-CM | POA: Diagnosis present

## 2016-05-09 DIAGNOSIS — I255 Ischemic cardiomyopathy: Secondary | ICD-10-CM | POA: Diagnosis present

## 2016-05-09 DIAGNOSIS — I480 Paroxysmal atrial fibrillation: Secondary | ICD-10-CM | POA: Diagnosis not present

## 2016-05-09 DIAGNOSIS — Z7982 Long term (current) use of aspirin: Secondary | ICD-10-CM

## 2016-05-09 DIAGNOSIS — D696 Thrombocytopenia, unspecified: Secondary | ICD-10-CM | POA: Diagnosis not present

## 2016-05-09 DIAGNOSIS — Z951 Presence of aortocoronary bypass graft: Secondary | ICD-10-CM

## 2016-05-09 DIAGNOSIS — F17210 Nicotine dependence, cigarettes, uncomplicated: Secondary | ICD-10-CM | POA: Diagnosis present

## 2016-05-09 DIAGNOSIS — I2510 Atherosclerotic heart disease of native coronary artery without angina pectoris: Secondary | ICD-10-CM | POA: Diagnosis not present

## 2016-05-09 DIAGNOSIS — I2582 Chronic total occlusion of coronary artery: Secondary | ICD-10-CM | POA: Diagnosis present

## 2016-05-09 DIAGNOSIS — N400 Enlarged prostate without lower urinary tract symptoms: Secondary | ICD-10-CM | POA: Diagnosis present

## 2016-05-09 DIAGNOSIS — E8770 Fluid overload, unspecified: Secondary | ICD-10-CM | POA: Diagnosis not present

## 2016-05-09 HISTORY — PX: LEFT HEART CATH AND CORONARY ANGIOGRAPHY: CATH118249

## 2016-05-09 LAB — BASIC METABOLIC PANEL
Anion gap: 8 (ref 5–15)
BUN: 14 mg/dL (ref 6–20)
CO2: 27 mmol/L (ref 22–32)
Calcium: 9.8 mg/dL (ref 8.9–10.3)
Chloride: 100 mmol/L — ABNORMAL LOW (ref 101–111)
Creatinine, Ser: 0.96 mg/dL (ref 0.61–1.24)
GFR calc Af Amer: >60 mL/min (ref >60)
GFR calc non Af Amer: >60 mL/min (ref >60)
Glucose, Bld: 135 mg/dL — ABNORMAL HIGH (ref 65–99)
POTASSIUM: 4.1 mmol/L (ref 3.5–5.1)
Sodium: 135 mmol/L (ref 135–145)

## 2016-05-09 LAB — URINE CULTURE: Culture: NO GROWTH

## 2016-05-09 LAB — CBC WITH DIFFERENTIAL/PLATELET
Basophils Absolute: 0 10*3/uL (ref 0.0–0.1)
Basophils Relative: 0 %
Eosinophils Absolute: 0 10*3/uL (ref 0.0–0.7)
Eosinophils Relative: 0 %
HEMATOCRIT: 47.8 % (ref 39.0–52.0)
HEMOGLOBIN: 16.6 g/dL (ref 13.0–17.0)
LYMPHS PCT: 13 %
Lymphs Abs: 1.4 10*3/uL (ref 0.7–4.0)
MCH: 32 pg (ref 26.0–34.0)
MCHC: 34.7 g/dL (ref 30.0–36.0)
MCV: 92.1 fL (ref 78.0–100.0)
Monocytes Absolute: 0.7 10*3/uL (ref 0.1–1.0)
Monocytes Relative: 7 %
NEUTROS ABS: 8.5 10*3/uL — AB (ref 1.7–7.7)
Neutrophils Relative %: 80 %
Platelets: 190 10*3/uL (ref 150–400)
RBC: 5.19 MIL/uL (ref 4.22–5.81)
RDW: 13.6 % (ref 11.5–15.5)
WBC: 10.6 10*3/uL — AB (ref 4.0–10.5)

## 2016-05-09 LAB — TROPONIN I: Troponin I: >65 ng/mL (ref <0.03)

## 2016-05-09 SURGERY — LEFT HEART CATH AND CORONARY ANGIOGRAPHY
Anesthesia: LOCAL

## 2016-05-09 MED ORDER — ASPIRIN EC 325 MG PO TBEC
325.0000 mg | DELAYED_RELEASE_TABLET | Freq: Once | ORAL | Status: AC
  Administered 2016-05-09: 325 mg via ORAL
  Filled 2016-05-09: qty 1

## 2016-05-09 MED ORDER — HEPARIN (PORCINE) IN NACL 2-0.9 UNIT/ML-% IJ SOLN
INTRAMUSCULAR | Status: AC
  Filled 2016-05-09: qty 1000

## 2016-05-09 MED ORDER — LIDOCAINE HCL (PF) 1 % IJ SOLN
INTRAMUSCULAR | Status: DC | PRN
  Administered 2016-05-09: 3 mL

## 2016-05-09 MED ORDER — HEPARIN (PORCINE) IN NACL 2-0.9 UNIT/ML-% IJ SOLN
INTRAMUSCULAR | Status: DC | PRN
  Administered 2016-05-09: 1000 mL

## 2016-05-09 MED ORDER — HEPARIN SODIUM (PORCINE) 1000 UNIT/ML IJ SOLN
INTRAMUSCULAR | Status: AC
  Filled 2016-05-09: qty 1

## 2016-05-09 MED ORDER — FENTANYL CITRATE (PF) 100 MCG/2ML IJ SOLN
INTRAMUSCULAR | Status: DC | PRN
  Administered 2016-05-09: 50 ug via INTRAVENOUS

## 2016-05-09 MED ORDER — VERAPAMIL HCL 2.5 MG/ML IV SOLN
INTRAVENOUS | Status: AC
  Filled 2016-05-09: qty 2

## 2016-05-09 MED ORDER — NITROGLYCERIN IN D5W 200-5 MCG/ML-% IV SOLN
INTRAVENOUS | Status: DC | PRN
  Administered 2016-05-09: 10 ug/min via INTRAVENOUS
  Administered 2016-05-10: 20 ug/min via INTRAVENOUS

## 2016-05-09 MED ORDER — MIDAZOLAM HCL 2 MG/2ML IJ SOLN
INTRAMUSCULAR | Status: DC | PRN
  Administered 2016-05-09: 2 mg via INTRAVENOUS

## 2016-05-09 MED ORDER — HEPARIN (PORCINE) IN NACL 100-0.45 UNIT/ML-% IJ SOLN
12.0000 [IU]/kg/h | Freq: Once | INTRAMUSCULAR | Status: AC
  Administered 2016-05-09: 12 [IU]/kg/h via INTRAVENOUS
  Filled 2016-05-09: qty 250

## 2016-05-09 MED ORDER — LIDOCAINE HCL (PF) 1 % IJ SOLN
INTRAMUSCULAR | Status: AC
  Filled 2016-05-09: qty 30

## 2016-05-09 MED ORDER — IOPAMIDOL (ISOVUE-370) INJECTION 76%
INTRAVENOUS | Status: DC | PRN
  Administered 2016-05-09: 95 mL via INTRAVENOUS

## 2016-05-09 MED ORDER — DIPHENHYDRAMINE HCL 50 MG/ML IJ SOLN
INTRAMUSCULAR | Status: AC
  Filled 2016-05-09: qty 1

## 2016-05-09 MED ORDER — FENTANYL CITRATE (PF) 100 MCG/2ML IJ SOLN
INTRAMUSCULAR | Status: AC
  Filled 2016-05-09: qty 2

## 2016-05-09 MED ORDER — DIPHENHYDRAMINE HCL 50 MG/ML IJ SOLN
INTRAMUSCULAR | Status: DC | PRN
  Administered 2016-05-09: 25 mg via INTRAVENOUS

## 2016-05-09 MED ORDER — HEPARIN SODIUM (PORCINE) 5000 UNIT/ML IJ SOLN: 4000.0000 [IU] | Freq: Once | INTRAMUSCULAR | Status: DC

## 2016-05-09 MED ORDER — NITROGLYCERIN 0.4 MG SL SUBL
0.4000 mg | SUBLINGUAL_TABLET | SUBLINGUAL | Status: DC | PRN
  Administered 2016-05-09 (×2): 0.4 mg via SUBLINGUAL
  Filled 2016-05-09: qty 1

## 2016-05-09 MED ORDER — IOPAMIDOL (ISOVUE-370) INJECTION 76%
INTRAVENOUS | Status: AC
  Filled 2016-05-09: qty 125

## 2016-05-09 MED ORDER — NITROGLYCERIN IN D5W 200-5 MCG/ML-% IV SOLN
INTRAVENOUS | Status: AC
  Filled 2016-05-09: qty 250

## 2016-05-09 MED ORDER — HEPARIN SODIUM (PORCINE) 1000 UNIT/ML IJ SOLN
INTRAMUSCULAR | Status: DC | PRN
  Administered 2016-05-09: 3000 [IU] via INTRAVENOUS

## 2016-05-09 MED ORDER — MIDAZOLAM HCL 2 MG/2ML IJ SOLN
INTRAMUSCULAR | Status: AC
  Filled 2016-05-09: qty 2

## 2016-05-09 SURGICAL SUPPLY — 13 items
CATH INFINITI 5FR ANG PIGTAIL (CATHETERS) ×1 IMPLANT
CATH OPTITORQUE TIG 4.0 5F (CATHETERS) ×1 IMPLANT
DEVICE RAD COMP TR BAND LRG (VASCULAR PRODUCTS) ×1 IMPLANT
GLIDESHEATH SLEND SS 6F .021 (SHEATH) ×1 IMPLANT
GUIDEWIRE INQWIRE 1.5J.035X260 (WIRE) IMPLANT
HOVERMATT SINGLE USE (MISCELLANEOUS) ×1 IMPLANT
INQWIRE 1.5J .035X260CM (WIRE) ×2
KIT ENCORE 26 ADVANTAGE (KITS) ×1 IMPLANT
KIT HEART LEFT (KITS) ×2 IMPLANT
PACK CARDIAC CATHETERIZATION (CUSTOM PROCEDURE TRAY) ×2 IMPLANT
SYR MEDRAD MARK V 150ML (SYRINGE) ×2 IMPLANT
TRANSDUCER W/STOPCOCK (MISCELLANEOUS) ×2 IMPLANT
TUBING CIL FLEX 10 FLL-RA (TUBING) ×2 IMPLANT

## 2016-05-09 NOTE — Progress Notes (Signed)
ANTICOAGULATION CONSULT NOTE - Initial Consult  Pharmacy Consult for Heparin Indication: chest pain/ACS  Allergies  Allergen Reactions  . Amlodipine     LE swelling  . Contrast Media [Iodinated Diagnostic Agents]     Prostate problem had to wear a catheter for two weeks after having dye.   [name] Hydralazine Palpitations    Fatigue+    Patient Measurements: Height: 5\' 10"  (177.8 cm) Weight: 162 lb (73.5 kg) IBW/kg (Calculated) : 73  Vital Signs: Temp: 98.6 F (37 C) (04/09 2234) Temp Source: Oral (04/09 2234) BP: 141/100 (04/09 2130) Pulse Rate: 69 (04/09 2130)  Labs:  Recent Labs  05/07/16 1406 05/09/16 2030  HGB 15.2 16.6  HCT 43.8 47.8  PLT 170 190  CREATININE 0.99 0.96  TROPONINI  --  >65.00*    Estimated Creatinine Clearance: 72.9 mL/min (by C-G formula based on SCr of 0.96 mg/dL).   Medical History: Past Medical History:  Diagnosis Date  . Anxiety   . Atrial fibrillation ([place])    Limited episode, emergency room, June, 2013, spontaneous conversion to sinus rhythm, was evaluated By [name] 2013- no further visits.  [name] BPH (benign prostatic hypertrophy) with urinary obstruction    Nocturia is his primary symptom.  Acute urinary retention occurred when he got CT with contrast fall 2016 ([name]).  . COPD (chronic obstructive pulmonary disease) ([place]) fall 2016   Bullous changes noted on lower lung images of CT abd done by urology  . Diverticulitis   . GERD (gastroesophageal reflux disease)   . Has received pneumococcal vaccination   . Hyperlipidemia    statin intolerant  . Hypertension   . Microscopic hematuria Fall 2016   CT nl except nonobstructing stones.  Cystoscopy normal 12/30/14 ([name])  . Nephrolithiasis    11 mm stone on R, 2 mm stone on L, ureters clear  . 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  Urol ([name]) at [facility]; [name] referred him to [name] 03/2015: patient got bilat  nerve sparing , robot assisted laparoscopic radical prostatectomy and pelvic lymphadenectomy.  As of 10/21/15 urol f/u, PSA undetectable (repeat 04/22/16 result pending as per urol f/u that day), pt chose close PSA surveillance over adjuvant radiation tx.  . Tobacco dependence    down to 1-2 cigs per day as of 11/2015  . Urinary retention 05/2015   occurred s/p foley removal    Medications:  No anticoagulants pta  Assessment: 71yom to begin heparin for chest pain, elevated troponin. Transferring to [facility] for urgent cath.   Goal of Therapy:  Heparin level 0.3-0.7 units/ml Monitor platelets by anticoagulation protocol: Yes   Plan:  1) Heparin bolus 4000 units x 1 2) Heparin drip at 900 units/hr 3) Follow up after cath  Deboraha Sprang 05/09/2016,10:36 PM

## 2016-05-09 NOTE — ED Triage Notes (Signed)
Patient states that he woke up this am with a burning to his chest. Reports that he feels like it may be acid reflux, but also is concerned because his age

## 2016-05-09 NOTE — H&P (Signed)
Physician History and Physical    [name]. MRN: [id number] DOB/AGE: Oct 01, 1945 71 y.o. Admit date: 05/09/2016  Primary Care Physician: Anitra Lauth Referring MD: Dr. Tamera Punt Primary Cardiologist: New  HPI: 71 yo with history of HTN, COPD (still smokes), prostate cancer s/p surgery presented with NSTEMI.  Patient reports that he developed epigastric burning like prior GERD that began about 8 am this morning.  This continued all day without resolution, so he finally went to ER for evaluation.  In ER, ECG was unremarkable but TnI was > 65 (repeated and confirmed).  He was given NTG x 2 without relief.  Currently, his epigastric pain rates 7/10 though he appears comfortable.  Given ongoing pain and elevated TnI, it was decided to bring him urgently to the cath lab this evening.    He has had some recent hematuria, likely related to nephrolithiasis.   Review of systems complete and found to be negative unless listed above   Past Medical History:  Diagnosis Date  . Anxiety   . Atrial fibrillation ([place])    Limited episode, emergency room, June, 2013, spontaneous conversion to sinus rhythm, was evaluated By [name] 2013- no further visits.  [name] BPH (benign prostatic hypertrophy) with urinary obstruction    Nocturia is his primary symptom.  Acute urinary retention occurred when he got CT with contrast fall 2016 ([name]).  . COPD (chronic obstructive pulmonary disease) ([place]) fall 2016   Bullous changes noted on lower lung images of CT abd done by urology  . Diverticulitis   . GERD (gastroesophageal reflux disease)   . Has received pneumococcal vaccination   . Hyperlipidemia    statin intolerant  . Hypertension   . Microscopic hematuria Fall 2016   CT nl except nonobstructing stones.  Cystoscopy normal 12/30/14 ([name])  . Nephrolithiasis    11 mm stone on R, 2 mm stone on L, ureters clear  . 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   Urol ([name]) at [facility]; [name] referred him to [name] 03/2015: patient got bilat nerve sparing , robot assisted laparoscopic radical prostatectomy and pelvic lymphadenectomy.  As of 10/21/15 urol f/u, PSA undetectable (repeat 04/22/16 result pending as per urol f/u that day), pt chose close PSA surveillance over adjuvant radiation tx.  . Tobacco dependence    down to 1-2 cigs per day as of 11/2015  . Urinary retention 05/2015   occurred s/p foley removal     Family History  Problem Relation Age of Onset  . Hypertension Mother   . Cancer - Other Mother 80    breast  . Cancer Father     Lung ca age 74  . Diabetes Sister     Social History   Social History  . Marital status: Married    Spouse name: N/A  . Number of children: N/A  . Years of education: N/A   Occupational History  . Not on file.   Social History Main Topics  . Smoking status: Current Every Day Smoker    Packs/day: 1.00    Years: 55.00    Types: Cigarettes  . Smokeless tobacco: Never Used  . Alcohol use No  . Drug use: No  . Sexual activity: Not on file   Other Topics Concern  . Not on file   Social History Narrative   Married, 2 grown children.   HS education.  Orig from [place], lives there  now.   Occupation: maintenance.  Drives a tractor a lot, drives a pickup truck a lot, rides horses a lot.   Tobacco: 20 pack-yr hx (current as of 07/2012)   No drugs.   Alcohol: none    No current facility-administered medications on file prior to encounter.    Current Outpatient Prescriptions on File Prior to Encounter  Medication Sig Dispense Refill  . ALPRAZolam (XANAX) 0.25 MG tablet TAKE ONE TABLET BY MOUTH AT BEDTIME AS NEEDED 30 tablet 3  . aspirin 81 MG tablet Take 81 mg by mouth. Every other day    . benazepril (LOTENSIN) 40 MG tablet TAKE 1/2 TABLET BY MOUTH EVERY MORNING AND TAKE ONE TABLET EVERY EVENING 135 tablet 1  . hydrochlorothiazide (HYDRODIURIL) 25 MG tablet 1/2 tab po qd 15  tablet 6  . Omega-3 Fatty Acids (FISH OIL) 500 MG CAPS Take 1 capsule by mouth daily.    . ondansetron (ZOFRAN) 4 MG tablet Take 1 tablet (4 mg total) by mouth every 6 (six) hours. 12 tablet 0  . oxyCODONE-acetaminophen (PERCOCET/ROXICET) 5-325 MG tablet Take 1-2 tablets by mouth every 6 (six) hours as needed for severe pain. 15 tablet 0  . tamsulosin (FLOMAX) 0.4 MG CAPS capsule Take 1 capsule (0.4 mg total) by mouth daily. 14 capsule 0  . Vitamin D, Cholecalciferol, 400 units CAPS Take 1 capsule by mouth daily.    . [DISCONTINUED] Cetirizine HCl (ZYRTEC ALLERGY) 10 MG CAPS Take 1 capsule (10 mg total) by mouth daily. 20 capsule 0    Physical Exam: Blood pressure (!) 141/100, pulse 69, temperature 98.6 F (37 C), temperature source Oral, resp. rate 18, height 5\' 10"  (1.778 m), weight 162 lb (73.5 kg), SpO2 100 %.  General: NAD Neck: No JVD, no thyromegaly or thyroid nodule.  Lungs: Clear to auscultation bilaterally with normal respiratory effort. CV: Nondisplaced PMI.  Heart regular S1/S2, no S3/S4, no murmur.  No peripheral edema.  No carotid bruit.  Normal pedal pulses.  Abdomen: Soft, nontender, no hepatosplenomegaly, no distention.  Skin: Intact without lesions or rashes.  Neurologic: Alert and oriented x 3.  Psych: Normal affect. Extremities: No clubbing or cyanosis.  HEENT: Normal.   Labs:   Lab Results  Component Value Date   WBC 10.6 (H) 05/09/2016   HGB 16.6 05/09/2016   HCT 47.8 05/09/2016   MCV 92.1 05/09/2016   PLT 190 05/09/2016    Recent Labs Lab 05/09/16 2030  NA 135  K 4.1  CL 100*  CO2 27  BUN 14  CREATININE 0.96  CALCIUM 9.8  GLUCOSE 135*   Lab Results  Component Value Date   TROPONINI >65.00 ([place]) 05/09/2016    EKG: NSR, PVC, nonspecific T wave changes  ASSESSMENT AND PLAN: 71 yo with history of HTN, COPD (still smokes), prostate cancer s/p surgery presented with NSTEMI.  1. CAD: No prior cardiac history but presents with NSTEMI and ongoing chest  pain.  As we are unable to make him pain-free with NTG, will plan on urgent cath this evening.  He is listed as having a contrast allergy, but on questioning, he says that contrast in the past "made my prostate blow up."  No anaphylactoid symptoms.  Will give Benadryl prior to cath but hold off on steroids.  2. Smoking: He will need smoking cessation counseling.  3. HTN: Will likely need to continue his home meds.   Signed: Loralie Champagne 05/09/2016, 11:15 PM

## 2016-05-09 NOTE — ED Notes (Signed)
Heparin drip started.  Heparin 4,000 units bolus given before order was discontinued.   Daniel [name] notified of this drip.  Family at bedside during this transfer.

## 2016-05-09 NOTE — ED Provider Notes (Signed)
[facility] DEPT [facility] Provider Note   CSN: [id number] Arrival date & time: 05/09/16  2015     History   Chief Complaint Chief Complaint  Patient presents with  . Chest Pain    HPI Daniel [name]. is a 71 y.o. male.  HPI   71 year old male presents today with complaints of chest pain and indigestion.  Patient reports that 8 AM this morning he developed indigestion after eating.  He notes this has been off and on burning and belching since then.  Patient notes this feels similar to previous episodes of reflux not currently taking any medication for this.  He notes that usually reflux goes away on its own, this has been persistent since then.  Patient reports he is a longtime smoker, with a history of hypertension.  He denies any hyperlipidemia, denies any significant family history.  No history of MI or cardiac dysfunction.   Past Medical History:  Diagnosis Date  . Anxiety   . Atrial fibrillation ([place])    Limited episode, emergency room, June, 2013, spontaneous conversion to sinus rhythm, was evaluated By [name] 2013- no further visits.  [name] BPH (benign prostatic hypertrophy) with urinary obstruction    Nocturia is his primary symptom.  Acute urinary retention occurred when he got CT with contrast fall 2016 ([name]).  . COPD (chronic obstructive pulmonary disease) ([place]) fall 2016   Bullous changes noted on lower lung images of CT abd done by urology  . Diverticulitis   . GERD (gastroesophageal reflux disease)   . Has received pneumococcal vaccination   . Hyperlipidemia    statin intolerant  . Hypertension   . Microscopic hematuria Fall 2016   CT nl except nonobstructing stones.  Cystoscopy normal 12/30/14 ([name])  . Nephrolithiasis    11 mm stone on R, 2 mm stone on L, ureters clear  . 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  Urol ([name]) at [facility]; [name] referred him to [name] 03/2015: patient got  bilat nerve sparing , robot assisted laparoscopic radical prostatectomy and pelvic lymphadenectomy.  As of 10/21/15 urol f/u, PSA undetectable (repeat 04/22/16 result pending as per urol f/u that day), pt chose close PSA surveillance over adjuvant radiation tx.  . Tobacco dependence    down to 1-2 cigs per day as of 11/2015  . Urinary retention 05/2015   occurred s/p foley removal    Patient Active Problem List   Diagnosis Date Noted  . Non-ST elevation (NSTEMI) myocardial infarction ([place])   . Prostate cancer ([place]) 04/30/2015  . UTI (urinary tract infection) 11/14/2014  . Hyperlipidemia 10/30/2013  . Acute prostatitis 06/19/2013  . Uncontrolled hypertension 03/01/2013  . Tobacco dependence 03/01/2013  . Elevated PSA 03/01/2013  . BPH (benign prostatic hypertrophy) with urinary obstruction 08/30/2012  . GERD (gastroesophageal reflux disease) 08/30/2012  . HTN (hypertension), benign 08/30/2012  . Encounter to establish care with new doctor 08/30/2012  . Tinnitus of both ears 08/30/2012  . Health maintenance examination 08/30/2012  . Palpable abdominal aorta 08/30/2012  . Atrial fibrillation ([place])   . Ejection fraction   . Hypertension   . Anxiety     Past Surgical History:  Procedure Laterality Date  . aortic ultrasound  07/2012   NO AAA  . COLONOSCOPY  approx 2011 per pt   Done by surgeon in [place] after a bout of diverticulitis; normal per pt report--was told to repeat in 10 yrs.  [name]  INGUINAL HERNIA REPAIR  2003 and 2004   Both sides.  [name] LITHOTRIPSY  2004   Dr. Maryland Pink in [place].  [name] LYMPHADENECTOMY Bilateral 04/30/2015   Procedure: PELVIC LYMPHADENECTOMY;  Surgeon: [name], MD;  Location: [facility] ORS;  Service: Urology;  Laterality: Bilateral;  . PROSTATE BIOPSY  02/05/15   Prostate adenocarcinoma: pt considering treatment options as of 02/19/15  . ROBOT ASSISTED LAPAROSCOPIC RADICAL PROSTATECTOMY N/A 04/30/2015   Procedure: XI ROBOTIC ASSISTED LAPAROSCOPIC RADICAL PROSTATECTOMY  LEVEL 2;  Surgeon: [name], MD;  Location: [facility];  Service: Urology;  Laterality: N/A;  . TRANSTHORACIC ECHOCARDIOGRAM  03/22/2006   EF 65%, normal valves, no wall motion abnormalties, LA size normal      Home Medications    Prior to Admission medications   Medication Sig Start Date End Date Taking? Authorizing Provider  ALPRAZolam Duanne Moron) 0.25 MG tablet TAKE ONE TABLET BY MOUTH AT BEDTIME AS NEEDED 01/20/16   [name], MD  aspirin 81 MG tablet Take 81 mg by mouth. Every other day    Historical Provider, MD  benazepril (LOTENSIN) 40 MG tablet TAKE 1/2 TABLET BY MOUTH EVERY MORNING AND TAKE ONE TABLET EVERY EVENING 04/27/16   [name], MD  hydrochlorothiazide (HYDRODIURIL) 25 MG tablet 1/2 tab po qd 08/03/15   [name], MD  Omega-3 Fatty Acids (FISH OIL) 500 MG CAPS Take 1 capsule by mouth daily.    Historical Provider, MD  ondansetron (ZOFRAN) 4 MG tablet Take 1 tablet (4 mg total) by mouth every 6 (six) hours. 05/07/16   Alfonzo Beers, MD  oxyCODONE-acetaminophen (PERCOCET/ROXICET) 5-325 MG tablet Take 1-2 tablets by mouth every 6 (six) hours as needed for severe pain. 05/07/16   Alfonzo Beers, MD  tamsulosin (FLOMAX) 0.4 MG CAPS capsule Take 1 capsule (0.4 mg total) by mouth daily. 05/07/16   Alfonzo Beers, MD  Vitamin D, Cholecalciferol, 400 units CAPS Take 1 capsule by mouth daily.    Historical Provider, MD    Family History Family History  Problem Relation Age of Onset  . Hypertension Mother   . Cancer - Other Mother 81    breast  . Cancer Father     Lung ca age 56  . Diabetes Sister     Social History Social History  Substance Use Topics  . Smoking status: Current Every Day Smoker    Packs/day: 1.00    Years: 55.00    Types: Cigarettes  . Smokeless tobacco: Never Used  . Alcohol use No     Allergies   Amlodipine; Contrast media [iodinated diagnostic agents]; and Hydralazine   Review of Systems Review of Systems  All other systems reviewed  and are negative.    Physical Exam Updated Vital Signs BP (!) 127/97   Pulse (!) 0   Temp 98.6 F (37 C) (Oral)   Resp (!) 0   Ht 5\' 11"  (1.803 m)   Wt 71.7 kg   SpO2 (!) 0%   BMI 22.05 kg/m   Physical Exam  Constitutional: He is oriented to person, place, and time. He appears well-developed and well-nourished.  HENT:  Head: Normocephalic and atraumatic.  Eyes: Conjunctivae are normal. Pupils are equal, round, and reactive to light. Right eye exhibits no discharge. Left eye exhibits no discharge. No scleral icterus.  Neck: Normal range of motion. No JVD present. No tracheal deviation present.  Cardiovascular: Normal rate and regular rhythm.   Pulmonary/Chest: Effort normal. No stridor.  Abdominal:  Minor epigastric TTP  Neurological: He is alert and oriented to person, place, and time. Coordination normal.  Psychiatric: He has a normal mood and affect. His behavior is normal. Judgment and thought content normal.  Nursing note and vitals reviewed.    ED Treatments / Results  Labs (all labs ordered are listed, but only abnormal results are displayed) Labs Reviewed  TROPONIN I - Abnormal; Notable for the following:       Result Value   Troponin I >65.00 (*)    All other components within normal limits  CBC WITH DIFFERENTIAL/PLATELET - Abnormal; Notable for the following:    WBC 10.6 (*)    Neutro Abs 8.5 (*)    All other components within normal limits  BASIC METABOLIC PANEL - Abnormal; Notable for the following:    Chloride 100 (*)    Glucose, Bld 135 (*)    All other components within normal limits  TROPONIN I - Abnormal; Notable for the following:    Troponin I >65.00 (*)    All other components within normal limits  MRSA PCR SCREENING  HEPARIN LEVEL (UNFRACTIONATED)  CBC  BASIC METABOLIC PANEL    EKG  EKG Interpretation  Date/Time:  Monday May 09 2016 20:23:29 EDT Ventricular Rate:  77 PR Interval:  186 QRS Duration: 110 QT Interval:  374 QTC  Calculation: 423 R Axis:   93 Text Interpretation:  Sinus rhythm with marked sinus arrhythmia with occasional Premature ventricular complexes Rightward axis Borderline ECG since last tracing no significant change Confirmed by BELFI  MD, MELANIE ([id number]) on 05/09/2016 8:34:58 PM       Radiology No results found.  Procedures Procedures (including critical care time)  CRITICAL CARE Performed by: Elmer Ramp   Total critical care time: 35 minutes  Critical care time was exclusive of separately billable procedures and treating other patients.  Critical care was necessary to treat or prevent imminent or life-threatening deterioration.  Critical care was time spent personally by me on the following activities: development of treatment plan with patient and/or surrogate as well as nursing, discussions with consultants, evaluation of patient's response to treatment, examination of patient, obtaining history from patient or surrogate, ordering and performing treatments and interventions, ordering and review of laboratory studies, ordering and review of radiographic studies, pulse oximetry and re-evaluation of patient's condition.   Medications Ordered in ED Medications  nitroGLYCERIN (NITROSTAT) SL tablet 0.4 mg ( Sublingual MAR Unhold 05/10/16 0019)  heparin injection 4,000 Units ( Intravenous MAR Unhold 05/10/16 0019)  acetaminophen (TYLENOL) tablet 650 mg (not administered)  ondansetron (ZOFRAN) injection 4 mg (not administered)  0.9 %  sodium chloride infusion ( Intravenous New Bag/Given 05/10/16 0038)  sodium chloride flush (NS) 0.9 % injection 3 mL (not administered)  sodium chloride flush (NS) 0.9 % injection 3 mL (not administered)  0.9 %  sodium chloride infusion (not administered)  diazepam (VALIUM) tablet 5 mg (not administered)  atorvastatin (LIPITOR) tablet 80 mg (not administered)  nitroGLYCERIN 50 mg in dextrose 5 % 250 mL (0.2 mg/mL) infusion (25 mcg/min Intravenous New  Bag/Given 05/10/16 0039)  aspirin chewable tablet 81 mg (not administered)  metoprolol tartrate (LOPRESSOR) tablet 12.5 mg (12.5 mg Oral Given 05/10/16 0045)  heparin ADULT infusion 100 units/mL (25000 units/21mL sodium chloride 0.45%) (not administered)  aspirin EC tablet 325 mg (325 mg Oral Given 05/09/16 2204)  heparin ADULT infusion 100 units/mL (25000 units/268mL sodium chloride 0.45%) (12 Units/kg/hr  73.5 kg Intravenous New Bag/Given 05/09/16 2242)     Initial Impression /  Assessment and Plan / ED Course  I have reviewed the triage vital signs and the nursing notes.  Pertinent labs & imaging results that were available during my care of the patient were reviewed by me and considered in my medical decision making (see chart for details).      Final Clinical Impressions(s) / ED Diagnoses   Final diagnoses:  Non-STEMI (non-ST elevated myocardial infarction) ([place])    Labs: Trop, CBC, BMP,   Imaging: EKG  Consults: Cardiology   Therapeutics: Heparin, nitroglycerin,  Discharge Meds:   Assessment/Plan: 71 year old male presents today with indigestion.  Patient noted to have significantly elevated troponin.  Patient has no significant findings on his EKG.  This is likely an N- STEMI.  Is given aspirin and nitroglycerin which did not improve his symptoms.  I consulted cardiology immediately after receiving troponin results, I spoke with Dr. Algernon Huxley who recommended that patient be immediately brought over to [facility] and proceed to the Cath Lab.  He would consult Cath Lab.  He also recommended starting heparin.  Patient does not have any history of significant bleeds other than minor bleeding from recent kidney stone.  Patient is very well-appearing in no acute distress.  Patient transferred via [facility] to [facility] for continued management.   New Prescriptions Current Discharge Medication List       Okey Regal, PA-C 05/10/16 [place], MD 05/11/16 1348

## 2016-05-10 ENCOUNTER — Inpatient Hospital Stay (HOSPITAL_COMMUNITY): Payer: Medicare Other

## 2016-05-10 ENCOUNTER — Encounter (HOSPITAL_COMMUNITY): Payer: Self-pay | Admitting: Cardiovascular Disease

## 2016-05-10 ENCOUNTER — Other Ambulatory Visit: Payer: Self-pay | Admitting: *Deleted

## 2016-05-10 DIAGNOSIS — I25810 Atherosclerosis of coronary artery bypass graft(s) without angina pectoris: Secondary | ICD-10-CM | POA: Diagnosis not present

## 2016-05-10 DIAGNOSIS — Z01810 Encounter for preprocedural cardiovascular examination: Secondary | ICD-10-CM

## 2016-05-10 DIAGNOSIS — I2510 Atherosclerotic heart disease of native coronary artery without angina pectoris: Secondary | ICD-10-CM | POA: Diagnosis not present

## 2016-05-10 DIAGNOSIS — N400 Enlarged prostate without lower urinary tract symptoms: Secondary | ICD-10-CM | POA: Diagnosis present

## 2016-05-10 DIAGNOSIS — Z7982 Long term (current) use of aspirin: Secondary | ICD-10-CM | POA: Diagnosis not present

## 2016-05-10 DIAGNOSIS — J449 Chronic obstructive pulmonary disease, unspecified: Secondary | ICD-10-CM | POA: Diagnosis not present

## 2016-05-10 DIAGNOSIS — I517 Cardiomegaly: Secondary | ICD-10-CM | POA: Diagnosis not present

## 2016-05-10 DIAGNOSIS — D696 Thrombocytopenia, unspecified: Secondary | ICD-10-CM | POA: Diagnosis not present

## 2016-05-10 DIAGNOSIS — R079 Chest pain, unspecified: Secondary | ICD-10-CM | POA: Diagnosis present

## 2016-05-10 DIAGNOSIS — I255 Ischemic cardiomyopathy: Secondary | ICD-10-CM

## 2016-05-10 DIAGNOSIS — E8770 Fluid overload, unspecified: Secondary | ICD-10-CM | POA: Diagnosis not present

## 2016-05-10 DIAGNOSIS — I10 Essential (primary) hypertension: Secondary | ICD-10-CM | POA: Diagnosis not present

## 2016-05-10 DIAGNOSIS — I480 Paroxysmal atrial fibrillation: Secondary | ICD-10-CM | POA: Diagnosis not present

## 2016-05-10 DIAGNOSIS — E785 Hyperlipidemia, unspecified: Secondary | ICD-10-CM

## 2016-05-10 DIAGNOSIS — F17210 Nicotine dependence, cigarettes, uncomplicated: Secondary | ICD-10-CM | POA: Diagnosis present

## 2016-05-10 DIAGNOSIS — I2582 Chronic total occlusion of coronary artery: Secondary | ICD-10-CM | POA: Diagnosis present

## 2016-05-10 DIAGNOSIS — I214 Non-ST elevation (NSTEMI) myocardial infarction: Secondary | ICD-10-CM | POA: Diagnosis not present

## 2016-05-10 DIAGNOSIS — Z720 Tobacco use: Secondary | ICD-10-CM

## 2016-05-10 DIAGNOSIS — J9811 Atelectasis: Secondary | ICD-10-CM | POA: Diagnosis not present

## 2016-05-10 DIAGNOSIS — I4891 Unspecified atrial fibrillation: Secondary | ICD-10-CM | POA: Diagnosis not present

## 2016-05-10 DIAGNOSIS — I493 Ventricular premature depolarization: Secondary | ICD-10-CM | POA: Diagnosis not present

## 2016-05-10 DIAGNOSIS — J90 Pleural effusion, not elsewhere classified: Secondary | ICD-10-CM | POA: Diagnosis not present

## 2016-05-10 DIAGNOSIS — I25110 Atherosclerotic heart disease of native coronary artery with unstable angina pectoris: Secondary | ICD-10-CM | POA: Diagnosis not present

## 2016-05-10 DIAGNOSIS — I080 Rheumatic disorders of both mitral and aortic valves: Secondary | ICD-10-CM | POA: Diagnosis not present

## 2016-05-10 DIAGNOSIS — Z79899 Other long term (current) drug therapy: Secondary | ICD-10-CM | POA: Diagnosis not present

## 2016-05-10 DIAGNOSIS — Z8546 Personal history of malignant neoplasm of prostate: Secondary | ICD-10-CM | POA: Diagnosis not present

## 2016-05-10 LAB — SPIROMETRY WITH GRAPH
FEF 25-75 Post: 0.7 L/sec
FEF 25-75 Pre: 0.85 L/sec
FEF2575-%Change-Post: -18 %
FEF2575-%Pred-Post: 27 %
FEF2575-%Pred-Pre: 33 %
FEV1-%Change-Post: -6 %
FEV1-%Pred-Post: 71 %
FEV1-%Pred-Pre: 76 %
FEV1-POST: 2.4 L
FEV1-Pre: 2.56 L
FEV1FVC-%Change-Post: 0 %
FEV1FVC-%Pred-Pre: 75 %
FEV6-%Change-Post: -4 %
FEV6-%PRED-POST: 90 %
FEV6-%Pred-Pre: 94 %
FEV6-POST: 3.89 L
FEV6-PRE: 4.07 L
FEV6FVC-%CHANGE-POST: 1 %
FEV6FVC-%PRED-POST: 95 %
FEV6FVC-%PRED-PRE: 93 %
FVC-%CHANGE-POST: -6 %
FVC-%PRED-POST: 95 %
FVC-%Pred-Pre: 101 %
FVC-Post: 4.35 L
FVC-Pre: 4.64 L
POST FEV6/FVC RATIO: 89 %
PRE FEV6/FVC RATIO: 88 %
Post FEV1/FVC ratio: 55 %
Pre FEV1/FVC ratio: 55 %

## 2016-05-10 LAB — BASIC METABOLIC PANEL
Anion gap: 7 (ref 5–15)
BUN: 9 mg/dL (ref 6–20)
CO2: 24 mmol/L (ref 22–32)
CREATININE: 0.75 mg/dL (ref 0.61–1.24)
Calcium: 9.1 mg/dL (ref 8.9–10.3)
Chloride: 103 mmol/L (ref 101–111)
GFR calc Af Amer: >60 mL/min (ref >60)
GFR calc non Af Amer: >60 mL/min (ref >60)
Glucose, Bld: 105 mg/dL — ABNORMAL HIGH (ref 65–99)
POTASSIUM: 4 mmol/L (ref 3.5–5.1)
SODIUM: 134 mmol/L — AB (ref 135–145)

## 2016-05-10 LAB — VAS US DOPPLER PRE CABG
LCCAPSYS: 71 cm/s
LEFT ECA DIAS: -13 cm/s
LEFT VERTEBRAL DIAS: 12 cm/s
LICADSYS: -64 cm/s
Left CCA dist dias: -14 cm/s
Left CCA dist sys: -48 cm/s
Left CCA prox dias: 15 cm/s
Left ICA dist dias: -28 cm/s
Left ICA prox dias: -26 cm/s
Left ICA prox sys: -61 cm/s
RCCADSYS: -68 cm/s
RIGHT ECA DIAS: -13 cm/s
RIGHT VERTEBRAL DIAS: -17 cm/s
Right CCA prox dias: 18 cm/s
Right CCA prox sys: 81 cm/s

## 2016-05-10 LAB — ECHOCARDIOGRAM COMPLETE
HEIGHTINCHES: 71 in
WEIGHTICAEL: 2529.12 [oz_av]

## 2016-05-10 LAB — CBC
HCT: 43.9 % (ref 39.0–52.0)
Hemoglobin: 14.7 g/dL (ref 13.0–17.0)
MCH: 31 pg (ref 26.0–34.0)
MCHC: 33.5 g/dL (ref 30.0–36.0)
MCV: 92.6 fL (ref 78.0–100.0)
PLATELETS: 163 10*3/uL (ref 150–400)
RBC: 4.74 MIL/uL (ref 4.22–5.81)
RDW: 13.5 % (ref 11.5–15.5)
WBC: 9.2 10*3/uL (ref 4.0–10.5)

## 2016-05-10 LAB — HEPARIN LEVEL (UNFRACTIONATED): HEPARIN UNFRACTIONATED: 0.18 [IU]/mL — AB (ref 0.30–0.70)

## 2016-05-10 LAB — MRSA PCR SCREENING: MRSA BY PCR: NEGATIVE

## 2016-05-10 MED ORDER — ASPIRIN 81 MG PO CHEW
81.0000 mg | CHEWABLE_TABLET | Freq: Every day | ORAL | Status: DC
  Administered 2016-05-10 – 2016-05-11 (×2): 81 mg via ORAL
  Filled 2016-05-10 (×2): qty 1

## 2016-05-10 MED ORDER — HEPARIN (PORCINE) IN NACL 100-0.45 UNIT/ML-% IJ SOLN
1250.0000 [IU]/h | INTRAMUSCULAR | Status: DC
  Administered 2016-05-10: 900 [IU]/h via INTRAVENOUS
  Administered 2016-05-12: 1250 [IU]/h via INTRAVENOUS
  Filled 2016-05-10 (×4): qty 250

## 2016-05-10 MED ORDER — SODIUM CHLORIDE 0.9 % IV SOLN: 250.0000 mL | INTRAVENOUS | Status: DC | PRN

## 2016-05-10 MED ORDER — METOPROLOL TARTRATE 12.5 MG HALF TABLET
12.5000 mg | ORAL_TABLET | Freq: Two times a day (BID) | ORAL | Status: DC
  Administered 2016-05-10: 12.5 mg via ORAL
  Filled 2016-05-10: qty 1

## 2016-05-10 MED ORDER — ATORVASTATIN CALCIUM 80 MG PO TABS
80.0000 mg | ORAL_TABLET | Freq: Every day | ORAL | Status: DC
  Administered 2016-05-10 – 2016-05-16 (×6): 80 mg via ORAL
  Filled 2016-05-10 (×6): qty 1

## 2016-05-10 MED ORDER — ALBUTEROL SULFATE (2.5 MG/3ML) 0.083% IN NEBU
2.5000 mg | INHALATION_SOLUTION | Freq: Once | RESPIRATORY_TRACT | Status: AC
  Administered 2016-05-10: 2.5 mg via RESPIRATORY_TRACT

## 2016-05-10 MED ORDER — ONDANSETRON HCL 4 MG/2ML IJ SOLN: 4.0000 mg | Freq: Four times a day (QID) | INTRAMUSCULAR | Status: DC | PRN

## 2016-05-10 MED ORDER — SODIUM CHLORIDE 0.9% FLUSH: 3.0000 mL | INTRAVENOUS | Status: DC | PRN

## 2016-05-10 MED ORDER — DIAZEPAM 5 MG PO TABS: 5.0000 mg | ORAL_TABLET | Freq: Four times a day (QID) | ORAL | Status: DC | PRN

## 2016-05-10 MED ORDER — SODIUM CHLORIDE 0.9 % IV SOLN
INTRAVENOUS | Status: AC
  Administered 2016-05-10: 01:00:00 via INTRAVENOUS

## 2016-05-10 MED ORDER — ACETAMINOPHEN 325 MG PO TABS: 650.0000 mg | ORAL_TABLET | ORAL | Status: DC | PRN

## 2016-05-10 MED ORDER — NITROGLYCERIN IN D5W 200-5 MCG/ML-% IV SOLN: 0.0000 ug/min | INTRAVENOUS | Status: DC

## 2016-05-10 MED ORDER — CARVEDILOL 3.125 MG PO TABS
3.1250 mg | ORAL_TABLET | Freq: Two times a day (BID) | ORAL | Status: DC
  Administered 2016-05-10 – 2016-05-11 (×3): 3.125 mg via ORAL
  Filled 2016-05-10 (×4): qty 1

## 2016-05-10 MED ORDER — SODIUM CHLORIDE 0.9% FLUSH
3.0000 mL | Freq: Two times a day (BID) | INTRAVENOUS | Status: DC
  Administered 2016-05-10 – 2016-05-11 (×3): 3 mL via INTRAVENOUS

## 2016-05-10 MED ORDER — PERFLUTREN LIPID MICROSPHERE
1.0000 mL | INTRAVENOUS | Status: AC | PRN
  Administered 2016-05-10: 4 mL via INTRAVENOUS
  Filled 2016-05-10: qty 10

## 2016-05-10 MED ORDER — NITROGLYCERIN IN D5W 200-5 MCG/ML-% IV SOLN
0.0000 ug/min | INTRAVENOUS | Status: DC
  Administered 2016-05-10: 25 ug/min via INTRAVENOUS
  Filled 2016-05-10: qty 250

## 2016-05-10 MED FILL — Verapamil HCl IV Soln 2.5 MG/ML: INTRAVENOUS | Qty: 2 | Status: AC

## 2016-05-10 NOTE — Progress Notes (Signed)
Pre-op Cardiac Surgery  Carotid Findings:  Bilateral 1-39% ICA stenosis, antegrade vertebral flow. Small amount of calcified plaque at distal CCA bilaterally.  Upper Extremity Right Left  Brachial Pressures 112 115  Radial Waveforms normal normal  Ulnar Waveforms normal normal  Palmar Arch (Allen's Test) waveform obliterates with radial compression and decreases greater than 50% with ulnar compression. waveform obliterates with radial compression and decreases greater than 50% with ulnar compression.   Lower  Extremity Right Left  Dorsalis Pedis 122 148  Posterior Tibial 127 147  Ankle/Brachial Indices 1.1 1.3   Daniel [name]- RDMS, RVT 6:09 PM  05/10/2016

## 2016-05-10 NOTE — Progress Notes (Signed)
Progress Note  Patient Name: Daniel [name]. Date of Encounter: 05/10/2016  Primary Cardiologist: Claiborne Billings  Subjective   71 year old Caucasian male admitted yesterday with non-STEMI. Positive cardiac risk factors. Troponins greater than 65. Cath showed 3 vessel disease with severe LV dysfunction. Will need coronary artery bypass grafting. Currently on IV heparin. Denies recurrent chest pain.  Inpatient Medications    Scheduled Meds: . aspirin  81 mg Oral Daily  . atorvastatin  80 mg Oral q1800  . heparin  4,000 Units Intravenous Once  . metoprolol tartrate  12.5 mg Oral BID  . sodium chloride flush  3 mL Intravenous Q12H   Continuous Infusions: . heparin 900 Units/hr (05/10/16 0802)  . nitroGLYCERIN Stopped (05/10/16 0415)   PRN Meds: sodium chloride, acetaminophen, diazepam, nitroGLYCERIN, ondansetron (ZOFRAN) IV, sodium chloride flush   Vital Signs    Vitals:   05/10/16 0630 05/10/16 0645 05/10/16 0700 05/10/16 0801  BP: 95/64 (!) 94/52 103/71   Pulse: (!) 59 (!) 56 (!) 49   Resp: 18 19 16    Temp:    98.4 F (36.9 C)  TempSrc:    Oral  SpO2: 99% 98% 99%   Weight:      Height:        Intake/Output Summary (Last 24 hours) at 05/10/16 0857 Last data filed at 05/10/16 0700  Gross per 24 hour  Intake           534.61 ml  Output              475 ml  Net            59.61 ml   Filed Weights   05/09/16 2019 05/10/16 0000  Weight: 162 lb (73.5 kg) 158 lb 1.1 oz (71.7 kg)    Telemetry    Normal sinus rhythm with PVCs - Personally Reviewed  ECG    Normal sinus rhythm with nonspecific ST and T-wave changes. - Personally Reviewed  Physical Exam   GEN: No acute distress.   Neck: No JVD Cardiac: RRR, no murmurs, rubs, or gallops.  Respiratory: Clear to auscultation bilaterally. GI: Soft, nontender, non-distended  MS: No edema; No deformity. Neuro:  Nonfocal  Psych: Normal affect  Right radial puncture site intact Labs    Chemistry Recent Labs Lab  05/07/16 1406 05/09/16 2030 05/10/16 0527  NA 137 135 134*  K 4.1 4.1 4.0  CL 105 100* 103  CO2 25 27 24   GLUCOSE 94 135* 105*  BUN 18 14 9   CREATININE 0.99 0.96 0.75  CALCIUM 9.2 9.8 9.1  GFRNONAA >60 >60 >60  GFRAA >60 >60 >60  ANIONGAP 7 8 7      Hematology Recent Labs Lab 05/07/16 1406 05/09/16 2030 05/10/16 0527  WBC 6.4 10.6* 9.2  RBC 4.69 5.19 4.74  HGB 15.2 16.6 14.7  HCT 43.8 47.8 43.9  MCV 93.4 92.1 92.6  MCH 32.4 32.0 31.0  MCHC 34.7 34.7 33.5  RDW 13.6 13.6 13.5  PLT 170 190 163    Cardiac Enzymes Recent Labs Lab 05/09/16 2030 05/09/16 2159  TROPONINI >65.00* >65.00*   No results for input(s): TROPIPOC in the last 168 hours.   BNPNo results for input(s): BNP, PROBNP in the last 168 hours.   DDimer No results for input(s): DDIMER in the last 168 hours.   Radiology    No results found.  Cardiac Studies     Patient Profile     71 y.o. male history of tobacco abuse, hypertension, and  hyperlipidemia (statin intolerant) who developed substernal chest burning which she thought was reflux. He was seen at [facility] where his troponins were elevated. He was transferred to [facility] and treated medically. He had ongoing chest pain and was taken to the Cath Lab by Dr. Claiborne Billings who demonstrated three-vessel disease and severe LV dysfunction. His anatomy was most suitable for coronary artery bypass grafting. He's had no recurrent chest pain. He is on IV heparin.  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 He is on low-dose beta blocker but is somewhat bradycardic. In addition, he is on IV heparin.. IV nitroglycerin was discontinued because of absence of pain and soft blood pressure. TCTS  was consulted for surgical revascularization. We will change his metoprolol to low-dose carvedilol. He will need a 2-D echocardiogram postoperatively  to assess LV function.  2: Hyperlipidemia-history of hyperlipidemia intolerant to statin therapy.  3: Hypertension-history of hypertension blood pressure measured at 97/69. His nitroglycerin was discontinued. He is on low-dose beta blocker. He was on a diuretic and ACE inhibitor at home.  4: Tobacco abuse-history of tobacco abuse properly counseled about cessation.  Angelina Sheriff, MD  05/10/2016, 8:57 AM

## 2016-05-10 NOTE — Progress Notes (Signed)
Daniel [name] for Heparin Indication: chest pain/ACS  Allergies  Allergen Reactions  . Amlodipine Other (See Comments)    LE swelling  . Contrast Media [Iodinated Diagnostic Agents] Other (See Comments)    Prostate problem had to wear a catheter for two weeks after having dye.   [name] Hydralazine Palpitations and Other (See Comments)    Fatigue+    Patient Measurements: Height: 5\' 11"  (180.3 cm) Weight: 158 lb 1.1 oz (71.7 kg) IBW/kg (Calculated) : 75.3  Vital Signs: Temp: 98.5 F (36.9 C) (04/10 1200) Temp Source: Oral (04/10 1200) BP: 119/68 (04/10 1600) Pulse Rate: 61 (04/10 1600)  Labs:  Recent Labs  05/09/16 2030 05/09/16 2159 05/10/16 0527 05/10/16 1546  HGB 16.6  --  14.7  --   HCT 47.8  --  43.9  --   PLT 190  --  163  --   HEPARINUNFRC  --   --   --  0.18*  CREATININE 0.96  --  0.75  --   TROPONINI >65.00* >65.00*  --   --     Estimated Creatinine Clearance: 85.9 mL/min (by C-G formula based on SCr of 0.75 mg/dL).   Medical History: Past Medical History:  Diagnosis Date  . Anxiety   . Atrial fibrillation ([place])    Limited episode, emergency room, June, 2013, spontaneous conversion to sinus rhythm, was evaluated By [name] 2013- no further visits.  [name] BPH (benign prostatic hypertrophy) with urinary obstruction    Nocturia is his primary symptom.  Acute urinary retention occurred when he got CT with contrast fall 2016 ([name]).  . COPD (chronic obstructive pulmonary disease) ([place]) fall 2016   Bullous changes noted on lower lung images of CT abd done by urology  . Diverticulitis   . GERD (gastroesophageal reflux disease)   . Has received pneumococcal vaccination   . Hyperlipidemia    statin intolerant  . Hypertension   . Microscopic hematuria Fall 2016   CT nl except nonobstructing stones.  Cystoscopy normal 12/30/14 ([name])  . Nephrolithiasis    11 mm stone on R, 2 mm stone on L, ureters clear  . 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  Urol ([name]) at [facility]; [name] referred him to [name] 03/2015: patient got bilat nerve sparing , robot assisted laparoscopic radical prostatectomy and pelvic lymphadenectomy.  As of 10/21/15 urol f/u, PSA undetectable (repeat 04/22/16 result pending as per urol f/u that day), pt chose close PSA surveillance over adjuvant radiation tx.  . Tobacco dependence    down to 1-2 cigs per day as of 11/2015  . Urinary retention 05/2015   occurred s/p foley removal    Medications:  No anticoagulants pta  Assessment: 71yom to begin heparin for NSTEMI awaiting CABG. Heparin level is subtherapeutic at 0.18. No issues with infusion or sxs of bleeding.   Goal of Therapy:  Heparin level 0.3-0.7 units/ml Monitor platelets by anticoagulation protocol: Yes   Plan:  1. Increase heparin infusion to 1100 units/hr 2. Heparin level in 8 hours 3. Daily heparin level  4. F/u on CTS plans

## 2016-05-10 NOTE — Progress Notes (Signed)
ANTICOAGULATION CONSULT NOTE - Follow Up Consult  Pharmacy Consult for heparin Indication: CAD  Allergies  Allergen Reactions  . Amlodipine     LE swelling  . Contrast Media [Iodinated Diagnostic Agents]     Prostate problem had to wear a catheter for two weeks after having dye.   [name] Hydralazine Palpitations    Fatigue+    Patient Measurements: Height: 5\' 10"  (177.8 cm) Weight: 162 lb (73.5 kg) IBW/kg (Calculated) : 73  Vital Signs: Temp: 98.6 F (37 C) (04/09 2234) Temp Source: Oral (04/09 2234) BP: 139/101 (04/10 0001) Pulse Rate: 0 (04/10 0006)  Labs:  Recent Labs  05/07/16 1406 05/09/16 2030 05/09/16 2159  HGB 15.2 16.6  --   HCT 43.8 47.8  --   PLT 170 190  --   CREATININE 0.99 0.96  --   TROPONINI  --  >65.00* >65.00*    Estimated Creatinine Clearance: 72.9 mL/min (by C-G formula based on SCr of 0.96 mg/dL).   Assessment: 71yo male c/o burning sensation to chest, tx'd from [facility] to [facility] for urgent cath for NSTEMI, to resume heparin 8hr after sheath pulled (2357).  Goal of Therapy:  Heparin level 0.3-0.7 units/ml Monitor platelets by anticoagulation protocol: Yes   Plan:  Will start heparin gtt at 900 units/hr @0800  and monitor heparin levels and CBC.  Wynona Neat, PharmD, BCPS  05/10/2016,12:16 AM

## 2016-05-10 NOTE — Progress Notes (Signed)
CARDIAC REHAB PHASE I   PRE:  Rate/Rhythm: 30 SR  BP:  Sitting: 107/69        SaO2: 99 RA  MODE:  Ambulation: 700 ft   POST:  Rate/Rhythm: 52 SR  BP:  Sitting: 116/72         SaO2: 98 RA  Pt ambulated 700 ft on RA, IV, hand held assist, steady gait, tolerated well with no complaints, very motivated to walk. Completed cardiac surgery pre-op education. Reviewed IS, activity progression, sternal precautions, cardiac surgery booklet and cardiac surgery guidelines. Pt verbalized understanding. Pt to recliner after walk, call bell within reach. Will follow.   [id number] Lenna Sciara, RN, BSN 05/10/2016 2:36 PM

## 2016-05-11 DIAGNOSIS — J449 Chronic obstructive pulmonary disease, unspecified: Secondary | ICD-10-CM

## 2016-05-11 DIAGNOSIS — I10 Essential (primary) hypertension: Secondary | ICD-10-CM

## 2016-05-11 DIAGNOSIS — I25110 Atherosclerotic heart disease of native coronary artery with unstable angina pectoris: Secondary | ICD-10-CM

## 2016-05-11 LAB — TYPE AND SCREEN
ABO/RH(D): A POS
Antibody Screen: NEGATIVE

## 2016-05-11 LAB — CBC
HCT: 40 % (ref 39.0–52.0)
Hemoglobin: 13.3 g/dL (ref 13.0–17.0)
MCH: 30.9 pg (ref 26.0–34.0)
MCHC: 33.3 g/dL (ref 30.0–36.0)
MCV: 92.8 fL (ref 78.0–100.0)
Platelets: 127 10*3/uL — ABNORMAL LOW (ref 150–400)
RBC: 4.31 MIL/uL (ref 4.22–5.81)
RDW: 13.7 % (ref 11.5–15.5)
WBC: 7.7 10*3/uL (ref 4.0–10.5)

## 2016-05-11 LAB — HEPARIN LEVEL (UNFRACTIONATED)
HEPARIN UNFRACTIONATED: 0.37 [IU]/mL (ref 0.30–0.70)
Heparin Unfractionated: 0.27 IU/mL — ABNORMAL LOW (ref 0.30–0.70)

## 2016-05-11 MED ORDER — POTASSIUM CHLORIDE 2 MEQ/ML IV SOLN
80.0000 meq | INTRAVENOUS | Status: DC
  Filled 2016-05-11: qty 40

## 2016-05-11 MED ORDER — VANCOMYCIN HCL 10 G IV SOLR
1250.0000 mg | INTRAVENOUS | Status: AC
  Administered 2016-05-12: 1250 mg via INTRAVENOUS
  Filled 2016-05-11: qty 1250

## 2016-05-11 MED ORDER — MAGNESIUM SULFATE 50 % IJ SOLN
40.0000 meq | INTRAMUSCULAR | Status: DC
  Filled 2016-05-11: qty 10

## 2016-05-11 MED ORDER — DEXTROSE 5 % IV SOLN
1.5000 g | INTRAVENOUS | Status: AC
  Administered 2016-05-12: 1.5 g via INTRAVENOUS
  Administered 2016-05-12: .75 g via INTRAVENOUS
  Filled 2016-05-11: qty 1.5

## 2016-05-11 MED ORDER — DEXTROSE 5 % IV SOLN
0.0000 ug/min | INTRAVENOUS | Status: DC
  Filled 2016-05-11: qty 4

## 2016-05-11 MED ORDER — PAPAVERINE HCL 30 MG/ML IJ SOLN
INTRAMUSCULAR | Status: AC
  Administered 2016-05-12: 500 mL
  Filled 2016-05-11: qty 2.5

## 2016-05-11 MED ORDER — DEXMEDETOMIDINE HCL IN NACL 400 MCG/100ML IV SOLN
0.1000 ug/kg/h | INTRAVENOUS | Status: AC
  Administered 2016-05-12: .2 ug/kg/h via INTRAVENOUS
  Filled 2016-05-11: qty 100

## 2016-05-11 MED ORDER — DOPAMINE-DEXTROSE 3.2-5 MG/ML-% IV SOLN
0.0000 ug/kg/min | INTRAVENOUS | Status: DC
  Filled 2016-05-11: qty 250

## 2016-05-11 MED ORDER — SODIUM CHLORIDE 0.9 % IV SOLN
INTRAVENOUS | Status: AC
  Administered 2016-05-12: .8 [IU]/h via INTRAVENOUS
  Filled 2016-05-11: qty 2.5

## 2016-05-11 MED ORDER — SODIUM CHLORIDE 0.9 % IV SOLN
INTRAVENOUS | Status: DC
  Filled 2016-05-11: qty 30

## 2016-05-11 MED ORDER — TRANEXAMIC ACID 1000 MG/10ML IV SOLN
1.5000 mg/kg/h | INTRAVENOUS | Status: AC
  Administered 2016-05-12: 1.5 mg/kg/h via INTRAVENOUS
  Filled 2016-05-11: qty 25

## 2016-05-11 MED ORDER — DEXTROSE 5 % IV SOLN
750.0000 mg | INTRAVENOUS | Status: DC
  Filled 2016-05-11: qty 750

## 2016-05-11 MED ORDER — TRANEXAMIC ACID (OHS) BOLUS VIA INFUSION
15.0000 mg/kg | INTRAVENOUS | Status: AC
  Administered 2016-05-12: 1095 mg via INTRAVENOUS
  Filled 2016-05-11: qty 1095

## 2016-05-11 MED ORDER — SODIUM CHLORIDE 0.9 % IV SOLN
30.0000 ug/min | INTRAVENOUS | Status: AC
  Administered 2016-05-12: 15 ug/min via INTRAVENOUS
  Filled 2016-05-11: qty 2

## 2016-05-11 MED ORDER — TRANEXAMIC ACID (OHS) PUMP PRIME SOLUTION
2.0000 mg/kg | INTRAVENOUS | Status: DC
  Filled 2016-05-11: qty 1.46

## 2016-05-11 MED ORDER — NITROGLYCERIN IN D5W 200-5 MCG/ML-% IV SOLN
2.0000 ug/min | INTRAVENOUS | Status: DC
  Filled 2016-05-11: qty 250

## 2016-05-11 NOTE — Progress Notes (Signed)
Progress Note  Patient Name: Daniel [name]. Date of Encounter: 05/11/2016  Primary Cardiologist: Claiborne Billings  Subjective   71 year old Caucasian male admitted yesterday with non-STEMI. Positive cardiac risk factors. Troponins greater than 65. Cath showed 3 vessel disease with severe LV dysfunction. Will need coronary artery bypass grafting. Currently on IV heparin. Denies recurrent chest pain. Plan is for coronary artery bypass grafting with Dr. Roxan Hockey tomorrow.  Inpatient Medications    Scheduled Meds: . aspirin  81 mg Oral Daily  . atorvastatin  80 mg Oral q1800  . carvedilol  3.125 mg Oral BID WC  . heparin  4,000 Units Intravenous Once  . sodium chloride flush  3 mL Intravenous Q12H   Continuous Infusions: . heparin 1,250 Units/hr (05/11/16 0800)  . nitroGLYCERIN Stopped (05/10/16 0415)   PRN Meds: sodium chloride, acetaminophen, diazepam, nitroGLYCERIN, ondansetron (ZOFRAN) IV, sodium chloride flush   Vital Signs    Vitals:   05/11/16 0700 05/11/16 0800 05/11/16 0819 05/11/16 0900  BP: 101/66 126/87 126/87 104/77  Pulse: 64 67 74 74  Resp: 20 17  15   Temp:   97.5 F (36.4 C)   TempSrc:   Oral   SpO2: 96% 97%  95%  Weight:      Height:        Intake/Output Summary (Last 24 hours) at 05/11/16 0920 Last data filed at 05/11/16 0900  Gross per 24 hour  Intake           716.55 ml  Output              725 ml  Net            -8.45 ml   Filed Weights   05/09/16 2019 05/10/16 0000 05/11/16 0500  Weight: 162 lb (73.5 kg) 158 lb 1.1 oz (71.7 kg) 160 lb 15 oz (73 kg)    Telemetry    Normal sinus rhythm with PVCs - Personally Reviewed  ECG    Not performed today. - Personally Reviewed  Physical Exam   GEN: No acute distress.   Neck: No JVD Cardiac: RRR, no murmurs, rubs, or gallops.  Respiratory: Clear to auscultation bilaterally. GI: Soft, nontender, non-distended  MS: No edema; No deformity. Neuro:  Nonfocal  Psych: Normal affect  Right radial  puncture site intact Labs    Chemistry  Recent Labs Lab 05/07/16 1406 05/09/16 2030 05/10/16 0527  NA 137 135 134*  K 4.1 4.1 4.0  CL 105 100* 103  CO2 25 27 24   GLUCOSE 94 135* 105*  BUN 18 14 9   CREATININE 0.99 0.96 0.75  CALCIUM 9.2 9.8 9.1  GFRNONAA >60 >60 >60  GFRAA >60 >60 >60  ANIONGAP 7 8 7      Hematology  Recent Labs Lab 05/09/16 2030 05/10/16 0527 05/11/16 0259  WBC 10.6* 9.2 7.7  RBC 5.19 4.74 4.31  HGB 16.6 14.7 13.3  HCT 47.8 43.9 40.0  MCV 92.1 92.6 92.8  MCH 32.0 31.0 30.9  MCHC 34.7 33.5 33.3  RDW 13.6 13.5 13.7  PLT 190 163 127*    Cardiac Enzymes  Recent Labs Lab 05/09/16 2030 05/09/16 2159  TROPONINI >65.00* >65.00*   No results for input(s): TROPIPOC in the last 168 hours.   BNPNo results for input(s): BNP, PROBNP in the last 168 hours.   DDimer No results for input(s): DDIMER in the last 168 hours.   Radiology    No results found.  Cardiac Studies   2D Echo-  Study Conclusions  -  Left ventricle: The cavity size was normal. Systolic function was   severely reduced. The estimated ejection fraction was in the   range of 25% to 30%. There is akinesis of the mid-apicalanterior,   inferolateral, and apical myocardium. Doppler parameters are   consistent with abnormal left ventricular relaxation (grade 1   diastolic dysfunction). - Aortic valve: There was mild regurgitation. - Aortic root: The aortic root was mildly dilated.  Impressions:  - Technically difficult; definity used; akinesis of the anterior,   mid/distal inferolateral and apical walls with overall severely   reduced LV systolic function; slow flow noted at LV apex   (suggesting increased risk of thrombus formation); grade 1   diastolic dysfunction; mild AI.  Cath-   Conclusion     Mid RCA lesion, 100 %stenosed.  Dist Cx lesion, 100 %stenosed.  Ost Cx to Prox Cx lesion, 50 %stenosed.  LM lesion, 30 %stenosed.  Prox LAD to Mid LAD lesion, 100  %stenosed.  The left ventricular ejection fraction is less than 25% by visual estimate.  LV end diastolic pressure is normal.  There is severe left ventricular systolic dysfunction.   Severe LV dysfunction with an ejection fraction of 20-25%. There was akinesis of the distal inferoapical segment with significant hypokinesis globally c/w an ischemic cardiomyopathy.  Severe multivessel CAD with 30% near ostial stenosis, 75% proximal, and total occlusion of a large LAD with distal collateralization; 50% proximal left circumflex stenoses with total occlusion of the distal circumflex; and chronic total occlusion of the mid RCA with collateralization distally via the RV marginal branch as well as via the left coronary injection.  RECOMMENDATION: Surgical consultation for CABG revascularization.      Patient Profile     71 y.o. male history of tobacco abuse, hypertension, and hyperlipidemia (statin intolerant) who developed substernal chest burning which she thought was reflux. He was seen at [facility] where his troponins were elevated. He was transferred to [facility] and treated medically. He had ongoing chest pain and was taken to the Cath Lab by Dr. Claiborne Billings who demonstrated three-vessel disease and severe LV dysfunction. His anatomy was most suitable for coronary artery bypass grafting. He's had no recurrent chest pain. He is on IV heparin. Her Roxan Hockey saw him and agreed with the plan for coronary artery bypass grafting with plans of performing this tomorrow morning.  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 He is on low-dose beta blocker but is somewhat bradycardic. In addition, he is on IV heparin.. IV nitroglycerin was discontinued because of absence of pain and soft blood pressure. TCTS  was consulted for surgical revascularization. We changed his metoprolol to  low-dose carvedilol. He will need a 2-D echocardiogram postoperatively to assess LV function. Dr. Roxan Hockey saw him yesterday and agreed with the need for coronary artery bypass grafting which has been scheduled for tomorrow morning. He remains on IV heparin and is pain-free. He does appear to have hematuria and has a history of nephrolithiasis.  2: Hyperlipidemia-history of hyperlipidemia intolerant to statin therapy.  3: Hypertension-history of hypertension blood pressure measured at 97/69. His nitroglycerin was discontinued. He is on low-dose beta blocker. He was on a diuretic and ACE inhibitor at home.  4: Tobacco abuse-history of tobacco abuse properly counseled about cessation. The patient is interested in stopping smoking  Signed, Quay Burow, MD  05/11/2016, 9:20 AM

## 2016-05-11 NOTE — Progress Notes (Signed)
ANTICOAGULATION CONSULT NOTE - Follow Up Consult  Pharmacy Consult for Heparin  Indication: Awaiting CABG  Allergies  Allergen Reactions  . Amlodipine Other (See Comments)    LE swelling  . Contrast Media [Iodinated Diagnostic Agents] Other (See Comments)    Prostate problem had to wear a catheter for two weeks after having dye.   [name] Hydralazine Palpitations and Other (See Comments)    Fatigue+   Patient Measurements: Height: 5\' 11"  (180.3 cm) Weight: 158 lb 1.1 oz (71.7 kg) IBW/kg (Calculated) : 75.3  Vital Signs: Temp: 97.5 F (36.4 C) (04/10 2300) Temp Source: Oral (04/10 2300) BP: 116/78 (04/10 2200) Pulse Rate: 61 (04/10 2200)  Labs:  Recent Labs  05/09/16 2030 05/09/16 2159 05/10/16 0527 05/10/16 1546 05/11/16 0259  HGB 16.6  --  14.7  --  13.3  HCT 47.8  --  43.9  --  40.0  PLT 190  --  163  --  127*  HEPARINUNFRC  --   --   --  0.18* 0.27*  CREATININE 0.96  --  0.75  --   --   TROPONINI >65.00* >65.00*  --   --   --     Estimated Creatinine Clearance: 85.9 mL/min (by C-G formula based on SCr of 0.75 mg/dL).  Assessment: S/P cath with multi-vessel disease awaiting CABG, heparin level is just below therapeutic range  Goal of Therapy:  Heparin level 0.3-0.7 units/ml Monitor platelets by anticoagulation protocol: Yes   Plan:  -Inc heparin to 1250 units/hr -1200 HL  Malcolm, Quast 05/11/2016,3:31 AM

## 2016-05-11 NOTE — Progress Notes (Signed)
      [address]       [place] [address]             [phone]      Resting comfortably  BP 135/81   Pulse 68   Temp 98.2 F (36.8 C)   Resp 15   Ht 5\' 11"  (1.803 m)   Wt 160 lb 15 oz (73 kg)   SpO2 99%   BMI 22.45 kg/m   Intake/Output Summary (Last 24 hours) at 05/11/16 1931 Last data filed at 05/11/16 1900  Gross per 24 hour  Intake           438.55 ml  Output              575 ml  Net          -136.45 ml    No chest pain  For CABG in AM  Daniel Kunesh C. Roxan Hockey, MD [facility] [phone]

## 2016-05-11 NOTE — Consult Note (Signed)
Reason for Consult:CAD Referring Physician: Dr. Becky Sax. is an 71 y.o. male.  HPI: 71 yo man with history of tobacco abuse, COPD, hypertension, prostate cancer, hyperlipidemia, and GERD. Developed burning sensation in chest on 05/09/16. He thought it was heartburn and did not seek immediate medical attention. The pain persisted throughout the day and became progressively worse until he finally went to ED. Initial ECG ok but troponin > 65. Underwent cardiac cath which revealed severe 3 vessel CAD and severely impaired LV function with an EF ~ 20%. Echo showed mild AI, EF 25% with anterior, inferolateral and apical akinesis.   He has been pain free since admission.  Past Medical History:  Diagnosis Date  . Anxiety   . Atrial fibrillation ([place])    Limited episode, emergency room, June, 2013, spontaneous conversion to sinus rhythm, was evaluated By [name] 2013- no further visits.  [name] BPH (benign prostatic hypertrophy) with urinary obstruction    Nocturia is his primary symptom.  Acute urinary retention occurred when he got CT with contrast fall 2016 ([name]).  . COPD (chronic obstructive pulmonary disease) ([place]) fall 2016   Bullous changes noted on lower lung images of CT abd done by urology  . Diverticulitis   . GERD (gastroesophageal reflux disease)   . Has received pneumococcal vaccination   . Hyperlipidemia    statin intolerant  . Hypertension   . Microscopic hematuria Fall 2016   CT nl except nonobstructing stones.  Cystoscopy normal 12/30/14 ([name])  . Nephrolithiasis    11 mm stone on R, 2 mm stone on L, ureters clear  . 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  Urol ([name]) at [facility]; [name] referred him to [name] 03/2015: patient got bilat nerve sparing , robot assisted laparoscopic radical prostatectomy and pelvic lymphadenectomy.  As of 10/21/15 urol f/u, PSA undetectable (repeat 04/22/16 result  pending as per urol f/u that day), pt chose close PSA surveillance over adjuvant radiation tx.  . Tobacco dependence    down to 1-2 cigs per day as of 11/2015  . Urinary retention 05/2015   occurred s/p foley removal    Past Surgical History:  Procedure Laterality Date  . aortic ultrasound  07/2012   NO AAA  . COLONOSCOPY  approx 2011 per pt   Done by surgeon in [place] after a bout of diverticulitis; normal per pt report--was told to repeat in 10 yrs.  . INGUINAL HERNIA REPAIR  2003 and 2004   Both sides.  [name] LEFT HEART CATH AND CORONARY ANGIOGRAPHY N/A 05/09/2016   Procedure: Left Heart Cath and Coronary Angiography;  Surgeon: [name], MD;  Location: [place] [facility];  Service: Cardiovascular;  Laterality: N/A;  . LITHOTRIPSY  2004   Dr. [place] Pink in [place].  [name] LYMPHADENECTOMY Bilateral 04/30/2015   Procedure: PELVIC LYMPHADENECTOMY;  Surgeon: [name], MD;  Location: [facility];  Service: Urology;  Laterality: Bilateral;  . PROSTATE BIOPSY  02/05/15   Prostate adenocarcinoma: pt considering treatment options as of 02/19/15  . ROBOT ASSISTED LAPAROSCOPIC RADICAL PROSTATECTOMY N/A 04/30/2015   Procedure: XI ROBOTIC ASSISTED LAPAROSCOPIC RADICAL PROSTATECTOMY LEVEL 2;  Surgeon: [name], MD;  Location: [facility];  Service: Urology;  Laterality: N/A;  . TRANSTHORACIC ECHOCARDIOGRAM  03/22/2006   EF 65%, normal valves, no wall motion abnormalties, LA size normal    Family History  Problem Relation Age of Onset  .  Hypertension Mother   . Cancer - Other Mother 64    breast  . Cancer Father     Lung ca age 57  . Diabetes Sister     Social History:  reports that he has been smoking Cigarettes.  He has a 55.00 pack-year smoking history. He has never used smokeless tobacco. He reports that he does not drink alcohol or use drugs.  Allergies:  Allergies  Allergen Reactions  . Amlodipine Other (See Comments)    LE swelling  . Contrast Media [Iodinated Diagnostic Agents] Other (See  Comments)    Prostate problem had to wear a catheter for two weeks after having dye.   [name] Hydralazine Palpitations and Other (See Comments)    Fatigue+    Medications:  Scheduled: . aspirin  81 mg Oral Daily  . atorvastatin  80 mg Oral q1800  . carvedilol  3.125 mg Oral BID WC  . heparin  4,000 Units Intravenous Once  . sodium chloride flush  3 mL Intravenous Q12H    Results for orders placed or performed during the hospital encounter of 05/09/16 (from the past 48 hour(s))  Troponin I     Status: Abnormal   Collection Time: 05/09/16  8:30 PM  Result Value Ref Range   Troponin I >65.00 (HH) <0.03 ng/mL    Comment: REPEATED TO VERIFY CRITICAL RESULT CALLED TO, READ BACK BY AND VERIFIED WITH: MERRITT,D,RN @ 2143 05/09/16 BY GWYN,P   CBC with Differential     Status: Abnormal   Collection Time: 05/09/16  8:30 PM  Result Value Ref Range   WBC 10.6 (H) 4.0 - 10.5 K/uL   RBC 5.19 4.22 - 5.81 MIL/uL   Hemoglobin 16.6 13.0 - 17.0 g/dL   HCT 47.8 39.0 - 52.0 %   MCV 92.1 78.0 - 100.0 fL   MCH 32.0 26.0 - 34.0 pg   MCHC 34.7 30.0 - 36.0 g/dL   RDW 13.6 11.5 - 15.5 %   Platelets 190 150 - 400 K/uL   Neutrophils Relative % 80 %   Neutro Abs 8.5 (H) 1.7 - 7.7 K/uL   Lymphocytes Relative 13 %   Lymphs Abs 1.4 0.7 - 4.0 K/uL   Monocytes Relative 7 %   Monocytes Absolute 0.7 0.1 - 1.0 K/uL   Eosinophils Relative 0 %   Eosinophils Absolute 0.0 0.0 - 0.7 K/uL   Basophils Relative 0 %   Basophils Absolute 0.0 0.0 - 0.1 K/uL  Basic metabolic panel     Status: Abnormal   Collection Time: 05/09/16  8:30 PM  Result Value Ref Range   Sodium 135 135 - 145 mmol/L   Potassium 4.1 3.5 - 5.1 mmol/L   Chloride 100 (L) 101 - 111 mmol/L   CO2 27 22 - 32 mmol/L   Glucose, Bld 135 (H) 65 - 99 mg/dL   BUN 14 6 - 20 mg/dL   Creatinine, Ser 0.96 0.61 - 1.24 mg/dL   Calcium 9.8 8.9 - 10.3 mg/dL   GFR calc non Af Amer >60 >60 mL/min   GFR calc Af Amer >60 >60 mL/min    Comment: (NOTE) The eGFR has  been calculated using the CKD EPI equation. This calculation has not been validated in all clinical situations. eGFR's persistently <60 mL/min signify possible Chronic Kidney Disease.    Anion gap 8 5 - 15  Troponin I     Status: Abnormal   Collection Time: 05/09/16  9:59 PM  Result Value Ref Range  Troponin I >65.00 (HH) <0.03 ng/mL    Comment: CRITICAL VALUE NOTED.  VALUE IS CONSISTENT WITH PREVIOUSLY REPORTED AND CALLED VALUE.  MRSA PCR Screening     Status: None   Collection Time: 05/10/16 12:17 AM  Result Value Ref Range   MRSA by PCR NEGATIVE NEGATIVE    Comment:        The GeneXpert MRSA Assay (FDA approved for NASAL specimens only), is one component of a comprehensive MRSA colonization surveillance program. It is not intended to diagnose MRSA infection nor to guide or monitor treatment for MRSA infections.   CBC     Status: None   Collection Time: 05/10/16  5:27 AM  Result Value Ref Range   WBC 9.2 4.0 - 10.5 K/uL   RBC 4.74 4.22 - 5.81 MIL/uL   Hemoglobin 14.7 13.0 - 17.0 g/dL   HCT 43.9 39.0 - 52.0 %   MCV 92.6 78.0 - 100.0 fL   MCH 31.0 26.0 - 34.0 pg   MCHC 33.5 30.0 - 36.0 g/dL   RDW 13.5 11.5 - 15.5 %   Platelets 163 150 - 400 K/uL  Basic metabolic panel     Status: Abnormal   Collection Time: 05/10/16  5:27 AM  Result Value Ref Range   Sodium 134 (L) 135 - 145 mmol/L   Potassium 4.0 3.5 - 5.1 mmol/L   Chloride 103 101 - 111 mmol/L   CO2 24 22 - 32 mmol/L   Glucose, Bld 105 (H) 65 - 99 mg/dL   BUN 9 6 - 20 mg/dL   Creatinine, Ser 0.75 0.61 - 1.24 mg/dL   Calcium 9.1 8.9 - 10.3 mg/dL   GFR calc non Af Amer >60 >60 mL/min   GFR calc Af Amer >60 >60 mL/min    Comment: (NOTE) The eGFR has been calculated using the CKD EPI equation. This calculation has not been validated in all clinical situations. eGFR's persistently <60 mL/min signify possible Chronic Kidney Disease.    Anion gap 7 5 - 15  Heparin level (unfractionated)     Status: Abnormal    Collection Time: 05/10/16  3:46 PM  Result Value Ref Range   Heparin Unfractionated 0.18 (L) 0.30 - 0.70 IU/mL    Comment:        IF HEPARIN RESULTS ARE BELOW EXPECTED VALUES, AND PATIENT DOSAGE HAS BEEN CONFIRMED, SUGGEST FOLLOW UP TESTING OF ANTITHROMBIN III LEVELS.   CBC     Status: Abnormal   Collection Time: 05/11/16  2:59 AM  Result Value Ref Range   WBC 7.7 4.0 - 10.5 K/uL   RBC 4.31 4.22 - 5.81 MIL/uL   Hemoglobin 13.3 13.0 - 17.0 g/dL   HCT 40.0 39.0 - 52.0 %   MCV 92.8 78.0 - 100.0 fL   MCH 30.9 26.0 - 34.0 pg   MCHC 33.3 30.0 - 36.0 g/dL   RDW 13.7 11.5 - 15.5 %   Platelets 127 (L) 150 - 400 K/uL  Heparin level (unfractionated)     Status: Abnormal   Collection Time: 05/11/16  2:59 AM  Result Value Ref Range   Heparin Unfractionated 0.27 (L) 0.30 - 0.70 IU/mL    Comment:        IF HEPARIN RESULTS ARE BELOW EXPECTED VALUES, AND PATIENT DOSAGE HAS BEEN CONFIRMED, SUGGEST FOLLOW UP TESTING OF ANTITHROMBIN III LEVELS.     No results found.  Review of Systems  Constitutional: Positive for malaise/fatigue. Negative for chills and fever.  Eyes: Negative for blurred vision  and double vision.  Respiratory: Positive for shortness of breath. Negative for cough and wheezing.   Cardiovascular: Positive for chest pain. Negative for claudication.  Gastrointestinal: Positive for heartburn. Negative for blood in stool.  Genitourinary: Positive for dysuria and hematuria.  Neurological: Negative for focal weakness, seizures and loss of consciousness.  All other systems reviewed and are negative.  Blood pressure 126/87, pulse 74, temperature 98.7 F (37.1 C), temperature source Oral, resp. rate 17, height '5\' 11"'$  (1.803 m), weight 160 lb 15 oz (73 kg), SpO2 97 %. Physical Exam  Vitals reviewed. Constitutional: He is oriented to person, place, and time. He appears well-developed and well-nourished. No distress.  HENT:  Head: Normocephalic and atraumatic.  Mouth/Throat: No  oropharyngeal exudate.  Eyes: Conjunctivae and EOM are normal. Pupils are equal, round, and reactive to light. No scleral icterus.  Neck: Neck supple. No thyromegaly present.  No carotid bruits  Cardiovascular: Normal rate, regular rhythm, normal heart sounds and intact distal pulses.  Exam reveals no gallop and no friction rub.   No murmur heard. Respiratory: Effort normal. No respiratory distress. He has no wheezes. He has no rales.  GI: Soft. He exhibits no distension. There is no tenderness.  Musculoskeletal: He exhibits no edema.  Lymphadenopathy:    He has no cervical adenopathy.  Neurological: He is alert and oriented to person, place, and time. No cranial nerve deficit.  No focal weakness  Skin: Skin is warm and dry.    Cardiac catheterization    Mid RCA lesion, 100 %stenosed.  Dist Cx lesion, 100 %stenosed.  Ost Cx to Prox Cx lesion, 50 %stenosed.  LM lesion, 30 %stenosed.  Prox LAD to Mid LAD lesion, 100 %stenosed.  The left ventricular ejection fraction is less than 25% by visual estimate.  LV end diastolic pressure is normal.  There is severe left ventricular systolic dysfunction.   Severe LV dysfunction with an ejection fraction of 20-25%. There was akinesis of the distal inferoapical segment with significant hypokinesis globally c/w an ischemic cardiomyopathy.  Severe multivessel CAD with 30% near ostial stenosis, 75% proximal, and total occlusion of a large LAD with distal collateralization; 50% proximal left circumflex stenoses with total occlusion of the distal circumflex; and chronic total occlusion of the mid RCA with collateralization distally via the RV marginal branch as well as via the left coronary injection.  RECOMMENDATION: Surgical consultation for CABG revascularization.    I personally reviewed the cath images and concur with the findings as noted above.  Assessment/Plan: Mr. Keil is a 71 yo man with multiple CRF, but no known prior CAD,  who presents with a prolonged episode of chest pain and r/I for an MI with a troponin of 65. Has severe 3 vessel CAD with chronic total occlusion of the LAD and severe LV dysfunction.   History of atrial fibrillation 5 years ago with no recurrence.  CABG is indicated for survival benefit and relief of symptoms.  I have discussed with the patient the general nature of the procedure, the need for general anesthesia, the use of cardiopulmonary bypass, and the incisions to be used with Mr. Meech and his family. We discussed the expected hospital stay, overall recovery and short and long term outcomes. I informed him of the indications, risks, benefits and alternatives. They understand the risks include, but are not limited to death, stroke, MI, DVT/PE, bleeding, possible need for transfusion, infections, cardiac arrhythmias, as well as other organ system dysfunction including respiratory, renal, or GI complications.  He accept the risks and agrees to proceed.  Tentatively plan CABG Thursday 4/12  [name] 05/11/2016, 8:48 AM

## 2016-05-11 NOTE — Progress Notes (Signed)
CARDIAC REHAB PHASE I   PRE:  Rate/Rhythm: 70 SR  BP:  Supine: 123/83  Sitting:   Standing:    SaO2: 97%RA  MODE:  Ambulation: 700 ft   POST:  Rate/Rhythm: 84 SR  BP:  Supine:   Sitting: 131/79  Standing:    SaO2: 95%RA 1440-1504 Pt walked 700 ft on RA with steady gait. No CP. To bed after walk. Did not want to watch pre op video. Will follow up after surgery.   Graylon Good, RN BSN  05/11/2016 3:00 PM

## 2016-05-11 NOTE — Progress Notes (Signed)
ANTICOAGULATION CONSULT NOTE - Follow Up Consult  Pharmacy Consult for Heparin  Indication: Awaiting CABG  Allergies  Allergen Reactions  . Amlodipine Other (See Comments)    LE swelling  . Contrast Media [Iodinated Diagnostic Agents] Other (See Comments)    Prostate problem had to wear a catheter for two weeks after having dye.   [name] Hydralazine Palpitations and Other (See Comments)    Fatigue+   Patient Measurements: Height: 5\' 11"  (180.3 cm) Weight: 160 lb 15 oz (73 kg) IBW/kg (Calculated) : 75.3  Vital Signs: Temp: 98.2 F (36.8 C) (04/11 1100) Temp Source: Oral (04/11 0819) BP: 123/83 (04/11 1400) Pulse Rate: 64 (04/11 1400)  Labs:  Recent Labs  05/09/16 2030 05/09/16 2159 05/10/16 0527 05/10/16 1546 05/11/16 0259 05/11/16 1200  HGB 16.6  --  14.7  --  13.3  --   HCT 47.8  --  43.9  --  40.0  --   PLT 190  --  163  --  127*  --   HEPARINUNFRC  --   --   --  0.18* 0.27* 0.37  CREATININE 0.96  --  0.75  --   --   --   TROPONINI >65.00* >65.00*  --   --   --   --     Estimated Creatinine Clearance: 87.4 mL/min (by C-G formula based on SCr of 0.75 mg/dL).  Assessment: S/P cath with multi-vessel disease awaiting CABG, heparin drip 1250 uts/hr heparin level 0.37 at goal.  CBC stable.  Having hematuria - stable not worse - just tea colored.  Passed kidney stone this weekend PTA.    Goal of Therapy:  Heparin level 0.3-0.7 units/ml Monitor platelets by anticoagulation protocol: Yes   Plan:  Continue  heparin to 1250 units/hr DAily CBC heparin level  Bonnita Nasuti Pharm.D. CPP, BCPS Clinical Pharmacist [phone] 05/11/2016 3:13 PM

## 2016-05-12 ENCOUNTER — Inpatient Hospital Stay (HOSPITAL_COMMUNITY): Payer: Medicare Other | Admitting: Certified Registered Nurse Anesthetist

## 2016-05-12 ENCOUNTER — Inpatient Hospital Stay (HOSPITAL_COMMUNITY): Payer: Medicare Other

## 2016-05-12 ENCOUNTER — Encounter (HOSPITAL_COMMUNITY): Payer: Self-pay | Admitting: Anesthesiology

## 2016-05-12 ENCOUNTER — Inpatient Hospital Stay (HOSPITAL_COMMUNITY)
Admission: EM | Disposition: A | Discharge status: Home / Self Care | Payer: Self-pay | Source: Home / Self Care | Attending: Thoracic Surgery (Cardiothoracic Vascular Surgery)

## 2016-05-12 DIAGNOSIS — I2510 Atherosclerotic heart disease of native coronary artery without angina pectoris: Secondary | ICD-10-CM | POA: Diagnosis present

## 2016-05-12 HISTORY — PX: TEE WITHOUT CARDIOVERSION: SHX5443

## 2016-05-12 HISTORY — PX: CORONARY ARTERY BYPASS GRAFT: SHX141

## 2016-05-12 LAB — POCT I-STAT 3, ART BLOOD GAS (G3+)
ACID-BASE DEFICIT: 1 mmol/L (ref 0.0–2.0)
ACID-BASE DEFICIT: 4 mmol/L — AB (ref 0.0–2.0)
ACID-BASE DEFICIT: 3 mmol/L — AB (ref 0.0–2.0)
ACID-BASE DEFICIT: 2 mmol/L (ref 0.0–2.0)
Acid-base deficit: 3 mmol/L — ABNORMAL HIGH (ref 0.0–2.0)
BICARBONATE: 25.1 mmol/L (ref 20.0–28.0)
BICARBONATE: 25.2 mmol/L (ref 20.0–28.0)
BICARBONATE: 26.3 mmol/L (ref 20.0–28.0)
Bicarbonate: 22.9 mmol/L (ref 20.0–28.0)
Bicarbonate: 22.6 mmol/L (ref 20.0–28.0)
Bicarbonate: 24.1 mmol/L (ref 20.0–28.0)
O2 SAT: 100 %
O2 Saturation: 96 %
O2 Saturation: 99 %
O2 Saturation: 98 %
O2 Saturation: 98 %
O2 Saturation: 98 %
PCO2 ART: 44.3 mmHg (ref 32.0–48.0)
PCO2 ART: 52.8 mmHg — AB (ref 32.0–48.0)
PH ART: 7.318 — AB (ref 7.350–7.450)
PH ART: 7.289 — AB (ref 7.350–7.450)
PH ART: 7.31 — AB (ref 7.350–7.450)
PO2 ART: 86 mmHg (ref 83.0–108.0)
PO2 ART: 128 mmHg — AB (ref 83.0–108.0)
PO2 ART: 118 mmHg — AB (ref 83.0–108.0)
Patient temperature: 36.2
Patient temperature: 37.3
TCO2: 26 mmol/L (ref 0–100)
TCO2: 24 mmol/L (ref 0–100)
TCO2: 24 mmol/L (ref 0–100)
TCO2: 26 mmol/L (ref 0–100)
TCO2: 27 mmol/L (ref 0–100)
TCO2: 28 mmol/L (ref 0–100)
pCO2 arterial: 46.2 mmHg (ref 32.0–48.0)
pCO2 arterial: 46.1 mmHg (ref 32.0–48.0)
pCO2 arterial: 53.3 mmHg — ABNORMAL HIGH (ref 32.0–48.0)
pCO2 arterial: 52.4 mmHg — ABNORMAL HIGH (ref 32.0–48.0)
pH, Arterial: 7.342 — ABNORMAL LOW (ref 7.350–7.450)
pH, Arterial: 7.3 — ABNORMAL LOW (ref 7.350–7.450)
pH, Arterial: 7.265 — ABNORMAL LOW (ref 7.350–7.450)
pO2, Arterial: 318 mmHg — ABNORMAL HIGH (ref 83.0–108.0)
pO2, Arterial: 135 mmHg — ABNORMAL HIGH (ref 83.0–108.0)
pO2, Arterial: 132 mmHg — ABNORMAL HIGH (ref 83.0–108.0)

## 2016-05-12 LAB — POCT I-STAT, CHEM 8
BUN: 20 mg/dL (ref 6–20)
BUN: 18 mg/dL (ref 6–20)
BUN: 20 mg/dL (ref 6–20)
BUN: 19 mg/dL (ref 6–20)
BUN: 18 mg/dL (ref 6–20)
BUN: 18 mg/dL (ref 6–20)
CALCIUM ION: 1.27 mmol/L (ref 1.15–1.40)
CALCIUM ION: 1.15 mmol/L (ref 1.15–1.40)
CHLORIDE: 100 mmol/L — AB (ref 101–111)
CHLORIDE: 103 mmol/L (ref 101–111)
CHLORIDE: 104 mmol/L (ref 101–111)
CREATININE: 0.5 mg/dL — AB (ref 0.61–1.24)
CREATININE: 0.6 mg/dL — AB (ref 0.61–1.24)
CREATININE: 0.6 mg/dL — AB (ref 0.61–1.24)
Calcium, Ion: 1.28 mmol/L (ref 1.15–1.40)
Calcium, Ion: 1.14 mmol/L — ABNORMAL LOW (ref 1.15–1.40)
Calcium, Ion: 1.14 mmol/L — ABNORMAL LOW (ref 1.15–1.40)
Calcium, Ion: 1.22 mmol/L (ref 1.15–1.40)
Chloride: 104 mmol/L (ref 101–111)
Chloride: 104 mmol/L (ref 101–111)
Chloride: 103 mmol/L (ref 101–111)
Creatinine, Ser: 0.6 mg/dL — ABNORMAL LOW (ref 0.61–1.24)
Creatinine, Ser: 0.7 mg/dL (ref 0.61–1.24)
Creatinine, Ser: 0.6 mg/dL — ABNORMAL LOW (ref 0.61–1.24)
GLUCOSE: 100 mg/dL — AB (ref 65–99)
GLUCOSE: 108 mg/dL — AB (ref 65–99)
GLUCOSE: 109 mg/dL — AB (ref 65–99)
Glucose, Bld: 99 mg/dL (ref 65–99)
Glucose, Bld: 102 mg/dL — ABNORMAL HIGH (ref 65–99)
Glucose, Bld: 115 mg/dL — ABNORMAL HIGH (ref 65–99)
HCT: 29 % — ABNORMAL LOW (ref 39.0–52.0)
HCT: 33 % — ABNORMAL LOW (ref 39.0–52.0)
HCT: 27 % — ABNORMAL LOW (ref 39.0–52.0)
HCT: 28 % — ABNORMAL LOW (ref 39.0–52.0)
HCT: 34 % — ABNORMAL LOW (ref 39.0–52.0)
HEMATOCRIT: 36 % — AB (ref 39.0–52.0)
HEMOGLOBIN: 11.2 g/dL — AB (ref 13.0–17.0)
HEMOGLOBIN: 9.2 g/dL — AB (ref 13.0–17.0)
HEMOGLOBIN: 9.5 g/dL — AB (ref 13.0–17.0)
HEMOGLOBIN: 11.6 g/dL — AB (ref 13.0–17.0)
Hemoglobin: 12.2 g/dL — ABNORMAL LOW (ref 13.0–17.0)
Hemoglobin: 9.9 g/dL — ABNORMAL LOW (ref 13.0–17.0)
POTASSIUM: 4.1 mmol/L (ref 3.5–5.1)
POTASSIUM: 5.1 mmol/L (ref 3.5–5.1)
POTASSIUM: 4.2 mmol/L (ref 3.5–5.1)
Potassium: 4.1 mmol/L (ref 3.5–5.1)
Potassium: 4.3 mmol/L (ref 3.5–5.1)
Potassium: 4.8 mmol/L (ref 3.5–5.1)
SODIUM: 136 mmol/L (ref 135–145)
SODIUM: 139 mmol/L (ref 135–145)
Sodium: 136 mmol/L (ref 135–145)
Sodium: 136 mmol/L (ref 135–145)
Sodium: 135 mmol/L (ref 135–145)
Sodium: 136 mmol/L (ref 135–145)
TCO2: 27 mmol/L (ref 0–100)
TCO2: 26 mmol/L (ref 0–100)
TCO2: 28 mmol/L (ref 0–100)
TCO2: 26 mmol/L (ref 0–100)
TCO2: 26 mmol/L (ref 0–100)
TCO2: 25 mmol/L (ref 0–100)

## 2016-05-12 LAB — CBC
HCT: 34 % — ABNORMAL LOW (ref 39.0–52.0)
HEMATOCRIT: 38.3 % — AB (ref 39.0–52.0)
HEMATOCRIT: 35.1 % — AB (ref 39.0–52.0)
HEMOGLOBIN: 11.8 g/dL — AB (ref 13.0–17.0)
Hemoglobin: 12.8 g/dL — ABNORMAL LOW (ref 13.0–17.0)
Hemoglobin: 11.2 g/dL — ABNORMAL LOW (ref 13.0–17.0)
MCH: 31.3 pg (ref 26.0–34.0)
MCH: 30.9 pg (ref 26.0–34.0)
MCH: 31.2 pg (ref 26.0–34.0)
MCHC: 33.4 g/dL (ref 30.0–36.0)
MCHC: 32.9 g/dL (ref 30.0–36.0)
MCHC: 33.6 g/dL (ref 30.0–36.0)
MCV: 93.6 fL (ref 78.0–100.0)
MCV: 93.7 fL (ref 78.0–100.0)
MCV: 92.9 fL (ref 78.0–100.0)
Platelets: 130 10*3/uL — ABNORMAL LOW (ref 150–400)
Platelets: 89 10*3/uL — ABNORMAL LOW (ref 150–400)
Platelets: 99 10*3/uL — ABNORMAL LOW (ref 150–400)
RBC: 4.09 MIL/uL — ABNORMAL LOW (ref 4.22–5.81)
RBC: 3.63 MIL/uL — ABNORMAL LOW (ref 4.22–5.81)
RBC: 3.78 MIL/uL — AB (ref 4.22–5.81)
RDW: 13.9 % (ref 11.5–15.5)
RDW: 13.5 % (ref 11.5–15.5)
RDW: 13.5 % (ref 11.5–15.5)
WBC: 6.5 10*3/uL (ref 4.0–10.5)
WBC: 7.6 10*3/uL (ref 4.0–10.5)
WBC: 10.3 10*3/uL (ref 4.0–10.5)

## 2016-05-12 LAB — CREATININE, SERUM: Creatinine, Ser: 0.7 mg/dL (ref 0.61–1.24)

## 2016-05-12 LAB — GLUCOSE, CAPILLARY
GLUCOSE-CAPILLARY: 108 mg/dL — AB (ref 65–99)
GLUCOSE-CAPILLARY: 108 mg/dL — AB (ref 65–99)
GLUCOSE-CAPILLARY: 115 mg/dL — AB (ref 65–99)
GLUCOSE-CAPILLARY: 99 mg/dL (ref 65–99)
Glucose-Capillary: 106 mg/dL — ABNORMAL HIGH (ref 65–99)

## 2016-05-12 LAB — SURGICAL PCR SCREEN
MRSA, PCR: NEGATIVE
Staphylococcus aureus: NEGATIVE

## 2016-05-12 LAB — MAGNESIUM: MAGNESIUM: 3 mg/dL — AB (ref 1.7–2.4)

## 2016-05-12 LAB — POCT I-STAT 4, (NA,K, GLUC, HGB,HCT)
Glucose, Bld: 116 mg/dL — ABNORMAL HIGH (ref 65–99)
HEMATOCRIT: 30 % — AB (ref 39.0–52.0)
HEMOGLOBIN: 10.2 g/dL — AB (ref 13.0–17.0)
Potassium: 4.4 mmol/L (ref 3.5–5.1)
SODIUM: 138 mmol/L (ref 135–145)

## 2016-05-12 LAB — APTT: aPTT: 37 seconds — ABNORMAL HIGH (ref 24–36)

## 2016-05-12 LAB — PROTIME-INR
INR: 1.45
PROTHROMBIN TIME: 17.8 s — AB (ref 11.4–15.2)

## 2016-05-12 LAB — HEMOGLOBIN AND HEMATOCRIT, BLOOD
HEMATOCRIT: 29.2 % — AB (ref 39.0–52.0)
HEMOGLOBIN: 9.8 g/dL — AB (ref 13.0–17.0)

## 2016-05-12 LAB — HEPARIN LEVEL (UNFRACTIONATED): Heparin Unfractionated: 0.27 IU/mL — ABNORMAL LOW (ref 0.30–0.70)

## 2016-05-12 LAB — PLATELET COUNT: Platelets: 94 10*3/uL — ABNORMAL LOW (ref 150–400)

## 2016-05-12 LAB — ABO/RH: ABO/RH(D): A POS

## 2016-05-12 SURGERY — CORONARY ARTERY BYPASS GRAFTING (CABG)
Anesthesia: General | Site: Chest

## 2016-05-12 MED ORDER — SODIUM CHLORIDE 0.9% FLUSH
3.0000 mL | Freq: Two times a day (BID) | INTRAVENOUS | Status: DC
  Administered 2016-05-13 – 2016-05-14 (×3): 3 mL via INTRAVENOUS

## 2016-05-12 MED ORDER — PROTAMINE SULFATE 10 MG/ML IV SOLN
INTRAVENOUS | Status: DC | PRN
  Administered 2016-05-12: 220 mg via INTRAVENOUS
  Administered 2016-05-12: 30 mg via INTRAVENOUS

## 2016-05-12 MED ORDER — ASPIRIN EC 325 MG PO TBEC
325.0000 mg | DELAYED_RELEASE_TABLET | Freq: Every day | ORAL | Status: DC
Start: 2016-05-13 — End: 2016-05-17
  Administered 2016-05-13 – 2016-05-17 (×5): 325 mg via ORAL
  Filled 2016-05-12 (×5): qty 1

## 2016-05-12 MED ORDER — LACTATED RINGERS IV SOLN: 500.0000 mL | Freq: Once | INTRAVENOUS | Status: DC | PRN

## 2016-05-12 MED ORDER — BISACODYL 5 MG PO TBEC
10.0000 mg | DELAYED_RELEASE_TABLET | Freq: Every day | ORAL | Status: DC
  Administered 2016-05-13 – 2016-05-15 (×3): 10 mg via ORAL
  Filled 2016-05-12 (×3): qty 2

## 2016-05-12 MED ORDER — INSULIN ASPART 100 UNIT/ML ~~LOC~~ SOLN: 0.0000 [IU] | SUBCUTANEOUS | Status: DC

## 2016-05-12 MED ORDER — 0.9 % SODIUM CHLORIDE (POUR BTL) OPTIME
TOPICAL | Status: DC | PRN
  Administered 2016-05-12: 6000 mL

## 2016-05-12 MED ORDER — PROPOFOL 10 MG/ML IV BOLUS
INTRAVENOUS | Status: DC | PRN
Start: 2016-05-12 — End: 2016-05-12
  Administered 2016-05-12: 120 mg via INTRAVENOUS

## 2016-05-12 MED ORDER — ORAL CARE MOUTH RINSE
15.0000 mL | Freq: Two times a day (BID) | OROMUCOSAL | Status: DC
  Administered 2016-05-12 – 2016-05-14 (×3): 15 mL via OROMUCOSAL

## 2016-05-12 MED ORDER — ROCURONIUM BROMIDE 10 MG/ML (PF) SYRINGE
PREFILLED_SYRINGE | INTRAVENOUS | Status: DC | PRN
Start: 2016-05-12 — End: 2016-05-12
  Administered 2016-05-12 (×3): 50 mg via INTRAVENOUS

## 2016-05-12 MED ORDER — FENTANYL CITRATE (PF) 250 MCG/5ML IJ SOLN
INTRAMUSCULAR | Status: DC | PRN
  Administered 2016-05-12 (×2): 250 ug via INTRAVENOUS
  Administered 2016-05-12: 100 ug via INTRAVENOUS
  Administered 2016-05-12: 200 ug via INTRAVENOUS
  Administered 2016-05-12 (×2): 250 ug via INTRAVENOUS
  Administered 2016-05-12 (×2): 50 ug via INTRAVENOUS
  Administered 2016-05-12: 250 ug via INTRAVENOUS

## 2016-05-12 MED ORDER — SODIUM CHLORIDE 0.45 % IV SOLN
INTRAVENOUS | Status: DC | PRN
  Administered 2016-05-12: 14:00:00 via INTRAVENOUS

## 2016-05-12 MED ORDER — ROCURONIUM BROMIDE 50 MG/5ML IV SOSY
PREFILLED_SYRINGE | INTRAVENOUS | Status: AC
  Filled 2016-05-12: qty 10

## 2016-05-12 MED ORDER — HEMOSTATIC AGENTS (NO CHARGE) OPTIME
TOPICAL | Status: DC | PRN
  Administered 2016-05-12: 1 via TOPICAL

## 2016-05-12 MED ORDER — SODIUM BICARBONATE 8.4 % IV SOLN
50.0000 meq | Freq: Once | INTRAVENOUS | Status: AC
  Administered 2016-05-12: 50 meq via INTRAVENOUS

## 2016-05-12 MED ORDER — TAMSULOSIN HCL 0.4 MG PO CAPS
0.4000 mg | ORAL_CAPSULE | Freq: Every day | ORAL | Status: DC
  Administered 2016-05-13 – 2016-05-17 (×5): 0.4 mg via ORAL
  Filled 2016-05-12 (×5): qty 1

## 2016-05-12 MED ORDER — ORAL CARE MOUTH RINSE: 15.0000 mL | Freq: Four times a day (QID) | OROMUCOSAL | Status: DC

## 2016-05-12 MED ORDER — DEXTROSE 5 % IV SOLN
1.5000 g | Freq: Two times a day (BID) | INTRAVENOUS | Status: AC
  Administered 2016-05-12 – 2016-05-14 (×4): 1.5 g via INTRAVENOUS
  Filled 2016-05-12 (×4): qty 1.5

## 2016-05-12 MED ORDER — OXYCODONE HCL 5 MG PO TABS
5.0000 mg | ORAL_TABLET | ORAL | Status: DC | PRN
  Administered 2016-05-14: 5 mg via ORAL
  Administered 2016-05-15 – 2016-05-16 (×3): 10 mg via ORAL
  Filled 2016-05-12: qty 1
  Filled 2016-05-12 (×3): qty 2

## 2016-05-12 MED ORDER — SODIUM CHLORIDE 0.9 % IV SOLN: 250.0000 mL | INTRAVENOUS | Status: DC

## 2016-05-12 MED ORDER — DOPAMINE-DEXTROSE 3.2-5 MG/ML-% IV SOLN: 3.0000 ug/kg/min | INTRAVENOUS | Status: DC

## 2016-05-12 MED ORDER — INSULIN REGULAR BOLUS VIA INFUSION
0.0000 [IU] | Freq: Three times a day (TID) | INTRAVENOUS | Status: DC
  Filled 2016-05-12: qty 10

## 2016-05-12 MED ORDER — HYDROMORPHONE HCL 1 MG/ML IJ SOLN: 0.2500 mg | INTRAMUSCULAR | Status: DC | PRN

## 2016-05-12 MED ORDER — ALBUMIN HUMAN 5 % IV SOLN
INTRAVENOUS | Status: DC | PRN
  Administered 2016-05-12: 12:00:00 via INTRAVENOUS

## 2016-05-12 MED ORDER — MORPHINE SULFATE (PF) 4 MG/ML IV SOLN: 1.0000 mg | INTRAVENOUS | Status: AC | PRN

## 2016-05-12 MED ORDER — LACTATED RINGERS IV SOLN
INTRAVENOUS | Status: DC | PRN
  Administered 2016-05-12: 07:00:00 via INTRAVENOUS

## 2016-05-12 MED ORDER — BISACODYL 10 MG RE SUPP: 10.0000 mg | Freq: Every day | RECTAL | Status: DC

## 2016-05-12 MED ORDER — ACETAMINOPHEN 160 MG/5ML PO SOLN: 1000.0000 mg | Freq: Four times a day (QID) | ORAL | Status: DC

## 2016-05-12 MED ORDER — ASPIRIN 81 MG PO CHEW
324.0000 mg | CHEWABLE_TABLET | Freq: Every day | ORAL | Status: DC
  Filled 2016-05-12: qty 4

## 2016-05-12 MED ORDER — LACTATED RINGERS IV SOLN
INTRAVENOUS | Status: DC | PRN
  Administered 2016-05-12 (×2): via INTRAVENOUS

## 2016-05-12 MED ORDER — TRAMADOL HCL 50 MG PO TABS
50.0000 mg | ORAL_TABLET | ORAL | Status: DC | PRN
  Administered 2016-05-14: 50 mg via ORAL
  Filled 2016-05-12: qty 1

## 2016-05-12 MED ORDER — FAMOTIDINE IN NACL 20-0.9 MG/50ML-% IV SOLN
20.0000 mg | Freq: Two times a day (BID) | INTRAVENOUS | Status: AC
  Administered 2016-05-12: 20 mg via INTRAVENOUS

## 2016-05-12 MED ORDER — ARTIFICIAL TEARS OP OINT
TOPICAL_OINTMENT | OPHTHALMIC | Status: DC | PRN
  Administered 2016-05-12: 1 via OPHTHALMIC

## 2016-05-12 MED ORDER — METOPROLOL TARTRATE 12.5 MG HALF TABLET
12.5000 mg | ORAL_TABLET | Freq: Two times a day (BID) | ORAL | Status: DC
  Administered 2016-05-13 (×2): 12.5 mg via ORAL
  Filled 2016-05-12 (×2): qty 1

## 2016-05-12 MED ORDER — PHENYLEPHRINE HCL 10 MG/ML IJ SOLN
0.0000 ug/min | INTRAMUSCULAR | Status: DC
  Filled 2016-05-12: qty 2

## 2016-05-12 MED ORDER — MUPIROCIN 2 % EX OINT
TOPICAL_OINTMENT | CUTANEOUS | Status: AC
  Filled 2016-05-12: qty 22

## 2016-05-12 MED ORDER — SODIUM CHLORIDE 0.9 % IV SOLN
30.0000 meq | Freq: Once | INTRAVENOUS | Status: DC
  Filled 2016-05-12: qty 15

## 2016-05-12 MED ORDER — MAGNESIUM SULFATE 4 GM/100ML IV SOLN
4.0000 g | Freq: Once | INTRAVENOUS | Status: AC
  Administered 2016-05-12: 4 g via INTRAVENOUS
  Filled 2016-05-12: qty 100

## 2016-05-12 MED ORDER — SODIUM CHLORIDE 0.9 % IV SOLN
INTRAVENOUS | Status: DC
  Filled 2016-05-12: qty 2.5

## 2016-05-12 MED ORDER — PROPOFOL 10 MG/ML IV BOLUS
INTRAVENOUS | Status: AC
  Filled 2016-05-12: qty 20

## 2016-05-12 MED ORDER — SODIUM CHLORIDE 0.9 % IV SOLN
0.0000 ug/kg/h | INTRAVENOUS | Status: DC
  Filled 2016-05-12: qty 2

## 2016-05-12 MED ORDER — GELATIN ABSORBABLE MT POWD
OROMUCOSAL | Status: DC | PRN
  Administered 2016-05-12: 4 mL via TOPICAL

## 2016-05-12 MED ORDER — DOCUSATE SODIUM 100 MG PO CAPS
200.0000 mg | ORAL_CAPSULE | Freq: Every day | ORAL | Status: DC
  Administered 2016-05-13 – 2016-05-15 (×3): 200 mg via ORAL
  Filled 2016-05-12 (×3): qty 2

## 2016-05-12 MED ORDER — CHLORHEXIDINE GLUCONATE 0.12 % MT SOLN
15.0000 mL | OROMUCOSAL | Status: AC
  Administered 2016-05-12: 15 mL via OROMUCOSAL

## 2016-05-12 MED ORDER — SODIUM CHLORIDE 0.9 % IV SOLN
INTRAVENOUS | Status: DC
  Administered 2016-05-12: 20 mL/h via INTRAVENOUS

## 2016-05-12 MED ORDER — ACETAMINOPHEN 500 MG PO TABS
1000.0000 mg | ORAL_TABLET | Freq: Four times a day (QID) | ORAL | Status: DC
  Administered 2016-05-12 – 2016-05-16 (×9): 1000 mg via ORAL
  Filled 2016-05-12 (×9): qty 2

## 2016-05-12 MED ORDER — FENTANYL CITRATE (PF) 250 MCG/5ML IJ SOLN
INTRAMUSCULAR | Status: AC
  Filled 2016-05-12: qty 5

## 2016-05-12 MED ORDER — MIDAZOLAM HCL 5 MG/5ML IJ SOLN
INTRAMUSCULAR | Status: DC | PRN
  Administered 2016-05-12: 1 mg via INTRAVENOUS
  Administered 2016-05-12: 2 mg via INTRAVENOUS
  Administered 2016-05-12: 1 mg via INTRAVENOUS
  Administered 2016-05-12 (×3): 2 mg via INTRAVENOUS

## 2016-05-12 MED ORDER — HEPARIN SODIUM (PORCINE) 1000 UNIT/ML IJ SOLN
INTRAMUSCULAR | Status: AC
  Filled 2016-05-12: qty 1

## 2016-05-12 MED ORDER — VANCOMYCIN HCL IN DEXTROSE 1-5 GM/200ML-% IV SOLN
1000.0000 mg | Freq: Once | INTRAVENOUS | Status: AC
  Administered 2016-05-12: 1000 mg via INTRAVENOUS
  Filled 2016-05-12: qty 200

## 2016-05-12 MED ORDER — ALBUMIN HUMAN 5 % IV SOLN
250.0000 mL | INTRAVENOUS | Status: AC | PRN
  Administered 2016-05-12 (×2): 250 mL via INTRAVENOUS

## 2016-05-12 MED ORDER — METOPROLOL TARTRATE 25 MG/10 ML ORAL SUSPENSION
12.5000 mg | Freq: Two times a day (BID) | ORAL | Status: DC
  Administered 2016-05-12: 12.5 mg
  Filled 2016-05-12: qty 5

## 2016-05-12 MED ORDER — SODIUM CHLORIDE 0.9% FLUSH: 3.0000 mL | INTRAVENOUS | Status: DC | PRN

## 2016-05-12 MED ORDER — ACETAMINOPHEN 650 MG RE SUPP
650.0000 mg | Freq: Once | RECTAL | Status: AC
  Administered 2016-05-12: 650 mg via RECTAL

## 2016-05-12 MED ORDER — DOPAMINE-DEXTROSE 3.2-5 MG/ML-% IV SOLN
INTRAVENOUS | Status: DC | PRN
  Administered 2016-05-12: 3 ug/kg/min via INTRAVENOUS

## 2016-05-12 MED ORDER — CHLORHEXIDINE GLUCONATE 0.12% ORAL RINSE (MEDLINE KIT): 15.0000 mL | Freq: Two times a day (BID) | OROMUCOSAL | Status: DC

## 2016-05-12 MED ORDER — LACTATED RINGERS IV SOLN
INTRAVENOUS | Status: DC
  Administered 2016-05-12 (×2): 20 mL/h via INTRAVENOUS

## 2016-05-12 MED ORDER — MIDAZOLAM HCL 10 MG/2ML IJ SOLN
INTRAMUSCULAR | Status: AC
  Filled 2016-05-12: qty 2

## 2016-05-12 MED ORDER — ACETAMINOPHEN 160 MG/5ML PO SOLN: 650.0000 mg | Freq: Once | ORAL | Status: AC

## 2016-05-12 MED ORDER — HEPARIN SODIUM (PORCINE) 1000 UNIT/ML IJ SOLN
INTRAMUSCULAR | Status: DC | PRN
  Administered 2016-05-12: 24000 [IU] via INTRAVENOUS
  Administered 2016-05-12: 2000 [IU] via INTRAVENOUS

## 2016-05-12 MED ORDER — PANTOPRAZOLE SODIUM 40 MG PO TBEC
40.0000 mg | DELAYED_RELEASE_TABLET | Freq: Every day | ORAL | Status: DC
  Administered 2016-05-14 – 2016-05-17 (×4): 40 mg via ORAL
  Filled 2016-05-12 (×4): qty 1

## 2016-05-12 MED ORDER — LACTATED RINGERS IV SOLN
INTRAVENOUS | Status: DC
  Administered 2016-05-12: 10 mL/h via INTRAVENOUS

## 2016-05-12 MED ORDER — ONDANSETRON HCL 4 MG/2ML IJ SOLN
4.0000 mg | Freq: Four times a day (QID) | INTRAMUSCULAR | Status: DC | PRN
  Administered 2016-05-12: 4 mg via INTRAVENOUS
  Filled 2016-05-12: qty 2

## 2016-05-12 MED ORDER — MORPHINE SULFATE (PF) 4 MG/ML IV SOLN
2.0000 mg | INTRAVENOUS | Status: DC | PRN
  Administered 2016-05-13: 4 mg via INTRAVENOUS
  Filled 2016-05-12: qty 1

## 2016-05-12 MED ORDER — METOPROLOL TARTRATE 5 MG/5ML IV SOLN
INTRAVENOUS | Status: DC | PRN
  Administered 2016-05-12: 2 mg via INTRAVENOUS

## 2016-05-12 MED ORDER — MIDAZOLAM HCL 2 MG/2ML IJ SOLN: 2.0000 mg | INTRAMUSCULAR | Status: DC | PRN

## 2016-05-12 MED ORDER — PROTAMINE SULFATE 10 MG/ML IV SOLN
INTRAVENOUS | Status: AC
Start: 2016-05-12 — End: 2016-05-12
  Filled 2016-05-12: qty 50

## 2016-05-12 MED ORDER — FENTANYL CITRATE (PF) 250 MCG/5ML IJ SOLN
INTRAMUSCULAR | Status: AC
  Filled 2016-05-12: qty 25

## 2016-05-12 MED ORDER — METOPROLOL TARTRATE 5 MG/5ML IV SOLN: 2.5000 mg | INTRAVENOUS | Status: DC | PRN

## 2016-05-12 MED ORDER — NITROGLYCERIN IN D5W 200-5 MCG/ML-% IV SOLN: 0.0000 ug/min | INTRAVENOUS | Status: DC

## 2016-05-12 MED ORDER — SODIUM BICARBONATE 4.2 % IV SOLN: 50.0000 meq | Freq: Once | INTRAVENOUS | Status: DC

## 2016-05-12 MED FILL — Lidocaine HCl IV Inj 20 MG/ML: INTRAVENOUS | Qty: 5 | Status: AC

## 2016-05-12 MED FILL — Heparin Sodium (Porcine) Inj 1000 Unit/ML: INTRAMUSCULAR | Qty: 10 | Status: AC

## 2016-05-12 MED FILL — Potassium Chloride Inj 2 mEq/ML: INTRAVENOUS | Qty: 40 | Status: AC

## 2016-05-12 MED FILL — Magnesium Sulfate Inj 50%: INTRAMUSCULAR | Qty: 10 | Status: AC

## 2016-05-12 MED FILL — Sodium Bicarbonate IV Soln 8.4%: INTRAVENOUS | Qty: 50 | Status: AC

## 2016-05-12 MED FILL — Electrolyte-R (PH 7.4) Solution: INTRAVENOUS | Qty: 4000 | Status: AC

## 2016-05-12 MED FILL — Heparin Sodium (Porcine) Inj 1000 Unit/ML: INTRAMUSCULAR | Qty: 30 | Status: AC

## 2016-05-12 MED FILL — Mannitol IV Soln 20%: INTRAVENOUS | Qty: 500 | Status: AC

## 2016-05-12 MED FILL — Sodium Chloride IV Soln 0.9%: INTRAVENOUS | Qty: 2000 | Status: AC

## 2016-05-12 SURGICAL SUPPLY — 101 items
ADAPTER CARDIO PERF ANTE/RETRO (ADAPTER) ×2 IMPLANT
ADH SKN CLS APL DERMABOND .7 (GAUZE/BANDAGES/DRESSINGS) ×2
ADPR PRFSN 84XANTGRD RTRGD (ADAPTER) ×2
APL SKNCLS STERI-STRIP NONHPOA (GAUZE/BANDAGES/DRESSINGS) ×2
BAG DECANTER FOR FLEXI CONT (MISCELLANEOUS) ×4 IMPLANT
BANDAGE ACE 4X5 VEL STRL LF (GAUZE/BANDAGES/DRESSINGS) ×4 IMPLANT
BANDAGE ACE 6X5 VEL STRL LF (GAUZE/BANDAGES/DRESSINGS) ×4 IMPLANT
BASKET HEART  (ORDER IN 25'S) (MISCELLANEOUS) ×1
BASKET HEART (ORDER IN 25'S) (MISCELLANEOUS) ×1
BASKET HEART (ORDER IN 25S) (MISCELLANEOUS) ×2 IMPLANT
BENZOIN TINCTURE PRP APPL 2/3 (GAUZE/BANDAGES/DRESSINGS) ×2 IMPLANT
BLADE STERNUM SYSTEM 6 (BLADE) ×4 IMPLANT
BLADE SURG 11 STRL SS (BLADE) ×2 IMPLANT
BNDG GAUZE ELAST 4 BULKY (GAUZE/BANDAGES/DRESSINGS) ×4 IMPLANT
CANISTER SUCT 3000ML PPV (MISCELLANEOUS) ×4 IMPLANT
CANNULA EZ GLIDE AORTIC 21FR (CANNULA) ×4 IMPLANT
CANNULA GUNDRY RCSP 15FR (MISCELLANEOUS) ×2 IMPLANT
CATH CPB KIT HENDRICKSON (MISCELLANEOUS) ×4 IMPLANT
CATH ROBINSON RED A/P 18FR (CATHETERS) ×4 IMPLANT
CATH THORACIC 36FR (CATHETERS) ×4 IMPLANT
CATH THORACIC 36FR RT ANG (CATHETERS) ×4 IMPLANT
CHLORAPREP W/TINT 10.5 ML (MISCELLANEOUS) ×2 IMPLANT
CLIP TI MEDIUM 24 (CLIP) IMPLANT
CLIP TI WIDE RED SMALL 24 (CLIP) ×2 IMPLANT
CRADLE DONUT ADULT HEAD (MISCELLANEOUS) ×4 IMPLANT
DERMABOND ADVANCED (GAUZE/BANDAGES/DRESSINGS) ×2
DERMABOND ADVANCED .7 DNX12 (GAUZE/BANDAGES/DRESSINGS) IMPLANT
DRAPE CARDIOVASCULAR INCISE (DRAPES) ×4
DRAPE SLUSH/WARMER DISC (DRAPES) ×4 IMPLANT
DRAPE SRG 135X102X78XABS (DRAPES) ×2 IMPLANT
DRSG COVADERM 4X14 (GAUZE/BANDAGES/DRESSINGS) ×4 IMPLANT
ELECT REM PT RETURN 9FT ADLT (ELECTROSURGICAL) ×8
ELECTRODE REM PT RTRN 9FT ADLT (ELECTROSURGICAL) ×4 IMPLANT
FELT TEFLON 1X6 (MISCELLANEOUS) ×8 IMPLANT
GAUZE SPONGE 4X4 12PLY STRL (GAUZE/BANDAGES/DRESSINGS) ×8 IMPLANT
GAUZE SPONGE 4X4 12PLY STRL LF (GAUZE/BANDAGES/DRESSINGS) ×4 IMPLANT
GLOVE BIO SURGEON STRL SZ 6 (GLOVE) ×6 IMPLANT
GLOVE BIO SURGEON STRL SZ 6.5 (GLOVE) ×2 IMPLANT
GLOVE BIO SURGEONS STRL SZ 6.5 (GLOVE) ×2
GLOVE BIOGEL PI IND STRL 6 (GLOVE) IMPLANT
GLOVE BIOGEL PI IND STRL 6.5 (GLOVE) IMPLANT
GLOVE BIOGEL PI INDICATOR 6 (GLOVE) ×10
GLOVE BIOGEL PI INDICATOR 6.5 (GLOVE) ×8
GLOVE SURG SIGNA 7.5 PF LTX (GLOVE) ×12 IMPLANT
GOWN STRL REUS W/ TWL LRG LVL3 (GOWN DISPOSABLE) ×8 IMPLANT
GOWN STRL REUS W/ TWL XL LVL3 (GOWN DISPOSABLE) ×4 IMPLANT
GOWN STRL REUS W/TWL LRG LVL3 (GOWN DISPOSABLE) ×32
GOWN STRL REUS W/TWL XL LVL3 (GOWN DISPOSABLE) ×8
HEMOSTAT POWDER SURGIFOAM 1G (HEMOSTASIS) ×12 IMPLANT
HEMOSTAT SURGICEL 2X14 (HEMOSTASIS) ×4 IMPLANT
INSERT FOGARTY XLG (MISCELLANEOUS) IMPLANT
KIT BASIN OR (CUSTOM PROCEDURE TRAY) ×4 IMPLANT
KIT ROOM TURNOVER OR (KITS) ×4 IMPLANT
KIT SUCTION CATH 14FR (SUCTIONS) ×8 IMPLANT
KIT VASOVIEW HEMOPRO VH 3000 (KITS) ×4 IMPLANT
MARKER GRAFT CORONARY BYPASS (MISCELLANEOUS) ×12 IMPLANT
NS IRRIG 1000ML POUR BTL (IV SOLUTION) ×22 IMPLANT
PACK OPEN HEART (CUSTOM PROCEDURE TRAY) ×4 IMPLANT
PAD ARMBOARD 7.5X6 YLW CONV (MISCELLANEOUS) ×8 IMPLANT
PAD ELECT DEFIB RADIOL ZOLL (MISCELLANEOUS) ×4 IMPLANT
PENCIL BUTTON HOLSTER BLD 10FT (ELECTRODE) ×4 IMPLANT
PUNCH AORTIC ROTATE 4.0MM (MISCELLANEOUS) ×2 IMPLANT
PUNCH AORTIC ROTATE 4.5MM 8IN (MISCELLANEOUS) IMPLANT
PUNCH AORTIC ROTATE 5MM 8IN (MISCELLANEOUS) IMPLANT
SET CARDIOPLEGIA MPS 5001102 (MISCELLANEOUS) ×2 IMPLANT
SUT BONE WAX W31G (SUTURE) ×4 IMPLANT
SUT MNCRL AB 4-0 PS2 18 (SUTURE) IMPLANT
SUT PROLENE 3 0 SH DA (SUTURE) ×4 IMPLANT
SUT PROLENE 4 0 RB 1 (SUTURE) ×4
SUT PROLENE 4 0 SH DA (SUTURE) IMPLANT
SUT PROLENE 4-0 RB1 .5 CRCL 36 (SUTURE) IMPLANT
SUT PROLENE 5 0 C 1 36 (SUTURE) ×2 IMPLANT
SUT PROLENE 6 0 C 1 30 (SUTURE) ×10 IMPLANT
SUT PROLENE 7 0 BV 1 (SUTURE) ×2 IMPLANT
SUT PROLENE 7 0 BV1 MDA (SUTURE) ×6 IMPLANT
SUT PROLENE 8 0 BV175 6 (SUTURE) IMPLANT
SUT STEEL 6MS V (SUTURE) ×4 IMPLANT
SUT STEEL STERNAL CCS#1 18IN (SUTURE) IMPLANT
SUT STEEL SZ 6 DBL 3X14 BALL (SUTURE) ×4 IMPLANT
SUT VIC AB 1 CTX 36 (SUTURE) ×8
SUT VIC AB 1 CTX36XBRD ANBCTR (SUTURE) ×4 IMPLANT
SUT VIC AB 2-0 CT1 27 (SUTURE) ×4
SUT VIC AB 2-0 CT1 TAPERPNT 27 (SUTURE) IMPLANT
SUT VIC AB 2-0 CTX 27 (SUTURE) IMPLANT
SUT VIC AB 3-0 SH 27 (SUTURE)
SUT VIC AB 3-0 SH 27X BRD (SUTURE) IMPLANT
SUT VIC AB 3-0 X1 27 (SUTURE) IMPLANT
SUT VICRYL 4-0 PS2 18IN ABS (SUTURE) ×2 IMPLANT
SUTURE E-PAK OPEN HEART (SUTURE) ×4 IMPLANT
SYSTEM SAHARA CHEST DRAIN ATS (WOUND CARE) ×4 IMPLANT
TAPE CLOTH SURG 4X10 WHT LF (GAUZE/BANDAGES/DRESSINGS) ×2 IMPLANT
TAPE PAPER 2X10 WHT MICROPORE (GAUZE/BANDAGES/DRESSINGS) ×2 IMPLANT
TOWEL GREEN STERILE (TOWEL DISPOSABLE) ×10 IMPLANT
TOWEL GREEN STERILE FF (TOWEL DISPOSABLE) ×4 IMPLANT
TOWEL OR 17X24 6PK STRL BLUE (TOWEL DISPOSABLE) ×4 IMPLANT
TOWEL OR 17X26 10 PK STRL BLUE (TOWEL DISPOSABLE) ×4 IMPLANT
TRAY FOLEY SILVER 16FR TEMP (SET/KITS/TRAYS/PACK) ×4 IMPLANT
TUBE FEEDING 8FR 16IN STR KANG (MISCELLANEOUS) ×4 IMPLANT
TUBING INSUFFLATION (TUBING) ×4 IMPLANT
UNDERPAD 30X30 (UNDERPADS AND DIAPERS) ×4 IMPLANT
WATER STERILE IRR 1000ML POUR (IV SOLUTION) ×8 IMPLANT

## 2016-05-12 NOTE — Procedures (Signed)
Extubation Procedure Note  Patient Details:   Name: Daniel [name]. DOB: 1945/06/13 MRN: [id number]   Airway Documentation:  Airway 8 mm (Active)  Secured at (cm) 23 cm 05/12/2016  1:00 PM  Measured From Lips 05/12/2016  1:00 PM  Secured Location Right 05/12/2016  1:00 PM  Secured By Pink Tape 05/12/2016  1:00 PM    Evaluation  O2 sats: stable throughout Complications: No apparent complications Patient did tolerate procedure well. Bilateral Breath Sounds: Clear   Yes  Patient had a NIF of -40 and a VC of 1.2. Patient oriented to time and place post extubation.  [place], Schnebly 05/12/2016, 6:24 PM

## 2016-05-12 NOTE — Progress Notes (Signed)
Pt transported to periop area on telemetry monitor. Transferred to periop monitor. Resting comfortably in bed, bedside report given to [facility], CRNA.   AM Coreg dose tubed to OR station 75 per CRNA request.

## 2016-05-12 NOTE — Progress Notes (Signed)
Dr. Roxy Manns made aware of pt's 1 hour ABG. Verbal orders received for 1 amp bicarb and to recheck ABG in 1 hour.. Will implement and continue to monitor.  Sherlie Ban, RN

## 2016-05-12 NOTE — Anesthesia Procedure Notes (Signed)
Anesthesia Procedure Note Procedures: Right IJ Gordy Councilman Catheter Insertion: [id number]: The patient was identified and consent obtained.  TO was performed, and full barrier precautions were used.  The skin was anesthetized with lidocaine-4cc plain with 25g needle.  Once the vein was located with the 22 ga. needle using ultrasound guidance , the wire was inserted into the vein.  The wire location was confirmed with ultrasound.  The tissue was dilated and the 8.5 [place] cordis catheter was carefully inserted. Afterwards Gordy Councilman catheter was inserted. PA catheter at 46cm.  The patient tolerated the procedure well.   CG

## 2016-05-12 NOTE — Anesthesia Preprocedure Evaluation (Signed)
Anesthesia Evaluation  Patient identified by MRN, date of birth, ID band Patient awake    Reviewed: Allergy & Precautions, NPO status , Patient's Chart, lab work & pertinent test results  Airway Mallampati: II  TM Distance: >3 FB     Dental   Pulmonary COPD, Current Smoker,    breath sounds clear to auscultation       Cardiovascular hypertension, + Past MI   Rhythm:Regular Rate:Normal     Neuro/Psych    GI/Hepatic Neg liver ROS, GERD  ,  Endo/Other    Renal/GU Renal disease     Musculoskeletal   Abdominal   Peds  Hematology   Anesthesia Other Findings   Reproductive/Obstetrics                             Anesthesia Physical Anesthesia Plan  ASA: III  Anesthesia Plan: General   Post-op Pain Management:    Induction: Intravenous  Airway Management Planned: Oral ETT  Additional Equipment:   Intra-op Plan:   Post-operative Plan: Post-operative intubation/ventilation  Informed Consent: I have reviewed the patients History and Physical, chart, labs and discussed the procedure including the risks, benefits and alternatives for the proposed anesthesia with the patient or authorized representative who has indicated his/her understanding and acceptance.   Dental advisory given  Plan Discussed with: CRNA and Anesthesiologist  Anesthesia Plan Comments:         Anesthesia Quick Evaluation

## 2016-05-12 NOTE — Progress Notes (Signed)
  Echocardiogram Echocardiogram Transesophageal has been performed.  Daniel [name] 05/12/2016, 9:04 AM

## 2016-05-12 NOTE — Progress Notes (Signed)
Staph pcr not yet resulted, mupiricin ointment applied to nares.

## 2016-05-12 NOTE — Progress Notes (Signed)
[facility] BRIEF SICU PROGRESS NOTE  Day of Surgery  S/P Procedure(s) (LRB): CORONARY ARTERY BYPASS GRAFTING (CABG) TIMES THREE USING LEFT INTERNAL MAMMARY ARTERY AND RIGHT SAPHENOUS LEG VEIN HARVESTED ENDOSCOPICALLY (N/A) TRANSESOPHAGEAL ECHOCARDIOGRAM (TEE) (N/A)   Extubated uneventfully NSR w/ stable hemodynamics Breathing comfortably w/ O2 sats 100% on 4 L/min Chest tube output low UOP excellent Labs okay  Plan: Continue routine care  Rexene Alberts, MD 05/12/2016 7:42 PM

## 2016-05-12 NOTE — Transfer of Care (Signed)
Immediate Anesthesia Transfer of Care Note  Patient: Daniel [name].  Procedure(s) Performed: Procedure(s): CORONARY ARTERY BYPASS GRAFTING (CABG) TIMES THREE USING LEFT INTERNAL MAMMARY ARTERY AND RIGHT SAPHENOUS LEG VEIN HARVESTED ENDOSCOPICALLY (N/A) TRANSESOPHAGEAL ECHOCARDIOGRAM (TEE) (N/A)  Patient Location: PACU and SICU  Anesthesia Type:General  Level of Consciousness: sedated and Patient remains intubated per anesthesia plan  Airway & Oxygen Therapy: Patient remains intubated per anesthesia plan and Patient placed on Ventilator (see vital sign flow sheet for setting)  Post-op Assessment: Report given to RN and Post -op Vital signs reviewed and stable  Post vital signs: Reviewed and stable  Last Vitals:  Vitals:   05/12/16 0500 05/12/16 0600  BP: 123/79 136/88  Pulse: 69 71  Resp: 18 16  Temp:      Last Pain:  Vitals:   05/12/16 0300  TempSrc: Oral  PainSc:       Patients Stated Pain Goal: 2 (87/19/59 7471)  Complications: No apparent anesthesia complications

## 2016-05-12 NOTE — Op Note (Signed)
Daniel [name], Daniel [name]                  ACCOUNT NO.:  [id number]  MEDICAL RECORD NO.:  [id number]  LOCATION:  [facility]                         FACILITY:  [facility]  PHYSICIAN:  Revonda Standard. Roxan Hockey, M.D.DATE OF BIRTH:  1945-09-22  DATE OF PROCEDURE:  05/12/2016 DATE OF DISCHARGE:                              OPERATIVE REPORT   PREOPERATIVE DIAGNOSES:  Three-vessel coronary disease, status post myocardial infarction.  POSTOPERATIVE DIAGNOSES:  Three-vessel coronary disease, status post myocardial infarction.  PROCEDURE:   Median sternotomy, extracorporeal circulation, Coronary artery bypass grafting x 3  Left internal mammary artery to diagonal,  Saphenous vein graft to distal LAD,  Saphenous vein graft to posteriordescending Endoscopic vein harvest right leg.  SURGEON:  Revonda Standard. Roxan Hockey, M.D.  ASSISTANT:  Lars Pinks, PA.  ANESTHESIA:  General.  FINDINGS:  Transesophageal echocardiography revealed dilated left ventricle with global hypokinesis and an ejection fraction of approximately 25%.  Mild MR and AI.  Acute infarct on lateral wall.  No graftable circumflex vessels.  The coronaries were good quality targets at site of anastomoses, but diffusely diseased.  CLINICAL NOTE:  Daniel [name] is a 71 year old gentleman who presented after a prolonged episode of chest pain and ruled in for a myocardial infarction.  At catheterization, he was found to have severe 3-vessel coronary disease and markedly impaired left ventricular function with an ejection fraction of 20% to 25%.  He was referred for coronary artery bypass grafting.  The indications, risks, benefits, and alternatives were discussed in detail with the patient.  He understood and accepted the risks and agreed to proceed.  OPERATIVE NOTE:  Daniel [name] was brought to the preoperative holding area on May 12, 2016.  Anesthesia placed a Swan-Ganz catheter and an arterial blood pressure monitoring line.  He was taken to the  operating room, anesthetized, and intubated.  A Foley catheter was placed. Intravenous antibiotics were administered.  Transesophageal echocardiography was performed by Dr. Finis Bud, findings as noted above.  The chest, abdomen, and legs were prepped and draped in usual sterile fashion.  An incision was made in the medial aspect of the right leg at the level of the knee.  The greater saphenous vein was identified and harvested endoscopically from mid calf to groin.  Simultaneously, a median sternotomy was performed, and the left internal mammary artery was harvested using standard technique.  Of note, the patient did have severe emphysema.  2000 units of heparin was administered during the vessel harvest.  The remainder of the full heparin dose was given after harvesting the conduits.  The pericardium was opened.  The ascending aorta was inspected.  There was no evidence of atherosclerotic disease.  The anticoagulation was checked and was adequate by ACT measurement.  The aorta was cannulated via concentric 2-0 Ethibond pledgeted pursestring sutures.  A dual stage venous cannula was placed via a pursestring suture in the right atrial appendage.  Cardiopulmonary bypass was initiated.  Flows were maintained per protocol.  The patient was cooled to 32 degrees Celsius.  The coronary arteries were inspected and anastomotic sites were chosen.  Of note, there was no graftable vessel in the circumflex distribution.  The conduits were inspected and  cut to length.  The heart sat relatively low in the chest, which was over expanded due to his emphysema, and the left mammary would not reach the distal LAD.  Therefore, the decision was made to use that as a graft to the diagonal branch.  A retrograde cardioplegia cannula was placed via a pursestring suture in the right atrium and directed into the coronary sinus.  A foam pad was placed in the pericardium to insulate the heart.  A  temperature probe was placed in myocardial septum.  A cardioplegia cannula was placed in the ascending aorta.  The aorta was crossclamped, the left ventricle was emptied via the aortic root vent.  Cardiac arrest then was achieved with a combination of cold antegrade and retrograde blood cardioplegia and topical iced saline. An initial 500 mL of cardioplegia was given antegrade.  There was a rapid diastolic arrest.  An additional 500 mL was given retrograde.  There was septal cooling to 9 degrees Celsius.  A reversed saphenous vein graft was placed end-to-side to the posterior descending branch of the right coronary.  This vessel accepted a 1.5-mm probe.  It was heavily diseased proximally but a probe did pass distally.  The vein was large caliber, good quality.  It was anastomosed end-to-side with a running 7-0 Prolene suture.  All anastomoses were probed proximally and distally at their completion before tying the suture.  Cardioplegia then was administered down the graft, and there was good flow, and there was also good hemostasis.  Next, a reversed saphenous vein graft was placed end-to-side to the distal LAD.  This was a 2-mm vessel. It was good quality at the site of the anastomosis.  It was totally occluded proximally, and there was some disease distally as well.  The vein again was large caliber, good quality.  An end-to-side anastomosis was performed with a running 7-0 Prolene suture.  A probe passed easily and with cardioplegia administration, there were good flow and good hemostasis.  The left internal mammary artery then was brought through a window in the pericardium.  The distal end was beveled, it was anastomosed end-to- side to the large anterolateral diagonal branch of the LAD.  This was a 1.5-mm vessel. It was heavily diseased proximally.  The mammary to diagonal anastomosis was performed with a running 8-0 Prolene suture. At the completion of the anastomosis, the  bulldog clamp was briefly removed.  There was good hemostasis, and there was rapid myocardial rewarming.  Additional cardioplegia was administered.  The cardioplegia cannula was removed from the ascending aorta.  The vein grafts were cut to length. The proximal vein graft anastomoses were performed to 4.5 mm punch aortotomies with running 6-0 Prolene sutures.  At the completion of final proximal anastomosis, the patient was placed in Trendelenburg position.  Lidocaine was administered.  A warm dose of retrograde cardioplegia was administered.  The left ventricle and aortic root were de-aired.  The aortic crossclamp was removed.  The total crossclamp time was 62 minutes.  The patient spontaneously resumed sinus rhythm and did not require defibrillation.  While rewarming was completed, all proximal and distal anastomoses were inspected for hemostasis.  Epicardial pacing wires were placed on the right ventricle and right atrium and atrial pacing was initiated for rate.  A dopamine infusion was initiated at 3 mcg/kg/minute.  When the patient had rewarmed to a core temperature of 37 degrees Celsius, he was weaned from cardiopulmonary bypass on the first attempt.  Total bypass time was 101 minutes.  Postbypass transesophageal echocardiography revealed some slight improvement in overall wall motion.  The initial cardiac index was greater than 2 L/min/sq m.  The patient remained hemodynamically stable throughout the postbypass period.  A test dose of protamine was administered and was well tolerated.  The atrial and aortic cannulae were removed.  The remainder of the protamine was administered without incident.  The chest was irrigated with warm saline.  Hemostasis was achieved.  Left pleural and mediastinal chest tubes were placed through separate subcostal incisions.  The pericardium was not closed.  The sternum was closed with a combination of single and double heavy gauge stainless steel  wires.  The pectoralis fascia, subcutaneous tissue, and skin were closed in standard fashion.  All sponge, needle, and instrument counts were correct at the end of the procedure.  The patient was taken from the operating room to the Surgical Intensive Care Unit in intubated and in good condition.     Revonda Standard Roxan Hockey, M.D.     SCH/MEDQ  D:  05/12/2016  T:  05/12/2016  Job:  [id number]

## 2016-05-12 NOTE — Interval H&P Note (Signed)
History and Physical Interval Note:  05/12/2016 7:54 AM  Daniel [name].  has presented today for surgery, with the diagnosis of CAD  The various methods of treatment have been discussed with the patient and family. After consideration of risks, benefits and other options for treatment, the patient has consented to  Procedure(s): CORONARY ARTERY BYPASS GRAFTING (CABG) (N/A) TRANSESOPHAGEAL ECHOCARDIOGRAM (TEE) (N/A) as a surgical intervention .  [name] history has been reviewed, patient examined, no change in status, stable for surgery.  I have reviewed the patient's chart and labs.  Questions were answered to the patient's satisfaction.     [name]

## 2016-05-12 NOTE — H&P (View-Only) (Signed)
      [address]       [place] [address]             [phone]      Resting comfortably  BP 135/81   Pulse 68   Temp 98.2 F (36.8 C)   Resp 15   Ht 5\' 11"  (1.803 m)   Wt 160 lb 15 oz (73 kg)   SpO2 99%   BMI 22.45 kg/m   Intake/Output Summary (Last 24 hours) at 05/11/16 1931 Last data filed at 05/11/16 1900  Gross per 24 hour  Intake           438.55 ml  Output              575 ml  Net          -136.45 ml    No chest pain  For CABG in AM  Anne Boltz C. Roxan Hockey, MD [facility] [phone]

## 2016-05-12 NOTE — Progress Notes (Signed)
RT note: Patient with good effort used the Incentive spirometer times 4 at 750.

## 2016-05-12 NOTE — Anesthesia Postprocedure Evaluation (Signed)
Anesthesia Post Note  Patient: Daniel [name].  Procedure(s) Performed: Procedure(s) (LRB): CORONARY ARTERY BYPASS GRAFTING (CABG) TIMES THREE USING LEFT INTERNAL MAMMARY ARTERY AND RIGHT SAPHENOUS LEG VEIN HARVESTED ENDOSCOPICALLY (N/A) TRANSESOPHAGEAL ECHOCARDIOGRAM (TEE) (N/A)  Patient location during evaluation: SICU Anesthesia Type: General Level of consciousness: patient remains intubated per anesthesia plan Pain management: pain level controlled Vital Signs Assessment: post-procedure vital signs reviewed and stable Respiratory status: patient remains intubated per anesthesia plan Cardiovascular status: stable Anesthetic complications: no       Last Vitals:  Vitals:   05/12/16 0500 05/12/16 0600  BP: 123/79 136/88  Pulse: 69 71  Resp: 18 16  Temp:      Last Pain:  Vitals:   05/12/16 0300  TempSrc: Oral  PainSc:                  Daniel [name]

## 2016-05-12 NOTE — Anesthesia Procedure Notes (Signed)
Arterial Line Insertion Start/End4/12/2016 7:05 AM, 05/12/2016 7:10 AM Performed by: Linna Caprice Monterius Rolf, anesthesiologist  Patient location: Pre-op. Preanesthetic checklist: patient identified, IV checked, site marked, risks and benefits discussed, surgical consent, monitors and equipment checked, pre-op evaluation and timeout performed Left, brachial was placed  Attempts: 2 Procedure performed using ultrasound guided technique. Ultrasound Notes:anatomy identified and needle tip was noted to be adjacent to the nerve/plexus identified Following insertion, line sutured. Patient tolerated the procedure well with no immediate complications.

## 2016-05-12 NOTE — Brief Op Note (Addendum)
05/09/2016 - 05/12/2016  11:19 AM  PATIENT:  Daniel [name].  71 y.o. male  PRE-OPERATIVE DIAGNOSIS:  1. S/p NSTEMI 2.CAD  POST-OPERATIVE DIAGNOSIS:   1. S/p NSTEMI 2.CAD  PROCEDURE: TRANSESOPHAGEAL ECHOCARDIOGRAM (TEE), MEDIAN STERNOTOMY for CORONARY ARTERY BYPASS GRAFTING (CABG) x 3  LIMA to DIAGONAL,   SVG to LAD,   SVG to PDA EVH from right greater saphenous vein    SURGEON:  Surgeon(s) and Role:    * [name], MD - Primary  PHYSICIAN ASSISTANT: Lars Pinks PA-C  ANESTHESIA:   general  EBL:  Total I/O In: -  Out: 104 [Urine:75]  DRAINS: Chest tubes placed in the mediastinal and pleural spaces   COUNTS CORRECT:  YES  PLAN OF CARE: Admit to inpatient   PATIENT DISPOSITION:  ICU - intubated and hemodynamically stable.   Delay start of Pharmacological VTE agent (>24hrs) due to surgical blood loss or risk of bleeding: yes  BASELINE WEIGHT: 71 kg  XC= 62 min CPB= 101 min EF ~ 20-25%, 1+ MR, off pump with dopamine at 3 mcg/kg/min

## 2016-05-13 ENCOUNTER — Inpatient Hospital Stay (HOSPITAL_COMMUNITY): Payer: Medicare Other

## 2016-05-13 ENCOUNTER — Encounter (HOSPITAL_COMMUNITY): Payer: Self-pay | Admitting: Thoracic Surgery (Cardiothoracic Vascular Surgery)

## 2016-05-13 LAB — POCT I-STAT, CHEM 8
BUN: 26 mg/dL — AB (ref 6–20)
CHLORIDE: 97 mmol/L — AB (ref 101–111)
CREATININE: 0.7 mg/dL (ref 0.61–1.24)
Calcium, Ion: 1.21 mmol/L (ref 1.15–1.40)
Glucose, Bld: 155 mg/dL — ABNORMAL HIGH (ref 65–99)
HEMATOCRIT: 31 % — AB (ref 39.0–52.0)
Hemoglobin: 10.5 g/dL — ABNORMAL LOW (ref 13.0–17.0)
POTASSIUM: 3.9 mmol/L (ref 3.5–5.1)
Sodium: 135 mmol/L (ref 135–145)
TCO2: 28 mmol/L (ref 0–100)

## 2016-05-13 LAB — BASIC METABOLIC PANEL
ANION GAP: 7 (ref 5–15)
BUN: 19 mg/dL (ref 6–20)
CO2: 25 mmol/L (ref 22–32)
Calcium: 8.2 mg/dL — ABNORMAL LOW (ref 8.9–10.3)
Chloride: 105 mmol/L (ref 101–111)
Creatinine, Ser: 0.69 mg/dL (ref 0.61–1.24)
GFR calc Af Amer: >60 mL/min (ref >60)
GFR calc non Af Amer: >60 mL/min (ref >60)
GLUCOSE: 122 mg/dL — AB (ref 65–99)
POTASSIUM: 4.2 mmol/L (ref 3.5–5.1)
SODIUM: 137 mmol/L (ref 135–145)

## 2016-05-13 LAB — GLUCOSE, CAPILLARY
GLUCOSE-CAPILLARY: 100 mg/dL — AB (ref 65–99)
GLUCOSE-CAPILLARY: 123 mg/dL — AB (ref 65–99)
GLUCOSE-CAPILLARY: 124 mg/dL — AB (ref 65–99)
Glucose-Capillary: 113 mg/dL — ABNORMAL HIGH (ref 65–99)
Glucose-Capillary: 123 mg/dL — ABNORMAL HIGH (ref 65–99)

## 2016-05-13 LAB — CBC
HCT: 34.3 % — ABNORMAL LOW (ref 39.0–52.0)
HCT: 33 % — ABNORMAL LOW (ref 39.0–52.0)
Hemoglobin: 11.2 g/dL — ABNORMAL LOW (ref 13.0–17.0)
Hemoglobin: 10.8 g/dL — ABNORMAL LOW (ref 13.0–17.0)
MCH: 30.6 pg (ref 26.0–34.0)
MCH: 30.9 pg (ref 26.0–34.0)
MCHC: 32.7 g/dL (ref 30.0–36.0)
MCHC: 32.7 g/dL (ref 30.0–36.0)
MCV: 93.7 fL (ref 78.0–100.0)
MCV: 94.3 fL (ref 78.0–100.0)
PLATELETS: 106 10*3/uL — AB (ref 150–400)
PLATELETS: 112 10*3/uL — AB (ref 150–400)
RBC: 3.66 MIL/uL — AB (ref 4.22–5.81)
RBC: 3.5 MIL/uL — AB (ref 4.22–5.81)
RDW: 13.5 % (ref 11.5–15.5)
RDW: 13.7 % (ref 11.5–15.5)
WBC: 10.3 10*3/uL (ref 4.0–10.5)
WBC: 10.3 10*3/uL (ref 4.0–10.5)

## 2016-05-13 LAB — MAGNESIUM
MAGNESIUM: 2.3 mg/dL (ref 1.7–2.4)
Magnesium: 2.3 mg/dL (ref 1.7–2.4)

## 2016-05-13 LAB — CREATININE, SERUM: CREATININE: 0.78 mg/dL (ref 0.61–1.24)

## 2016-05-13 MED ORDER — ALPRAZOLAM 0.25 MG PO TABS
0.2500 mg | ORAL_TABLET | Freq: Every evening | ORAL | Status: DC | PRN
  Administered 2016-05-15 – 2016-05-16 (×2): 0.25 mg via ORAL
  Filled 2016-05-13 (×2): qty 1

## 2016-05-13 MED ORDER — INSULIN ASPART 100 UNIT/ML ~~LOC~~ SOLN
0.0000 [IU] | SUBCUTANEOUS | Status: DC
  Administered 2016-05-13: 2 [IU] via SUBCUTANEOUS
  Administered 2016-05-13: 4 [IU] via SUBCUTANEOUS

## 2016-05-13 MED ORDER — SODIUM CHLORIDE 0.9 % IV SOLN
30.0000 meq | Freq: Once | INTRAVENOUS | Status: AC
  Administered 2016-05-13: 30 meq via INTRAVENOUS
  Filled 2016-05-13: qty 15

## 2016-05-13 MED ORDER — FUROSEMIDE 10 MG/ML IJ SOLN
20.0000 mg | Freq: Two times a day (BID) | INTRAMUSCULAR | Status: AC
  Administered 2016-05-13 (×2): 20 mg via INTRAVENOUS
  Filled 2016-05-13 (×2): qty 2

## 2016-05-13 MED ORDER — ENOXAPARIN SODIUM 40 MG/0.4ML ~~LOC~~ SOLN
40.0000 mg | Freq: Every day | SUBCUTANEOUS | Status: DC
  Administered 2016-05-13 – 2016-05-16 (×4): 40 mg via SUBCUTANEOUS
  Filled 2016-05-13 (×4): qty 0.4

## 2016-05-13 NOTE — Discharge Instructions (Signed)
Coronary Artery Bypass Grafting, Care After ° °This sheet gives you information about how to care for yourself after your procedure. Your health care provider may also give you more specific instructions. If you have problems or questions, contact your health care provider. °What can I expect after the procedure? °After the procedure, it is common to have: °· Nausea and a lack of appetite. °· Constipation. °· Weakness and fatigue. °· Depression or irritability. °· Pain or discomfort in your incision areas. °Follow these instructions at home: °Medicines  °· Take over-the-counter and prescription medicines only as told by your health care provider. Do not stop taking medicines or start any new medicines without approval from your health care provider. °· If you were prescribed an antibiotic medicine, take it as told by your health care provider. Do not stop taking the antibiotic even if you start to feel better. °· Do not drive or use heavy machinery while taking prescription pain medicine. °Incision care  °· Follow instructions from your health care provider about how to take care of your incisions. Make sure you: °¨ Wash your hands with soap and water before you change your bandage (dressing). If soap and water are not available, use hand sanitizer. °¨ Change your dressing as told by your health care provider. °¨ Leave stitches (sutures), skin glue, or adhesive strips in place. These skin closures may need to stay in place for 2 weeks or longer. If adhesive strip edges start to loosen and curl up, you may trim the loose edges. Do not remove adhesive strips completely unless your health care provider tells you to do that. °· Keep incision areas clean, dry, and protected. °· Check your incision areas every day for signs of infection. Check for: °¨ More redness, swelling, or pain. °¨ More fluid or blood. °¨ Warmth. °¨ Pus or a bad smell. °· If incisions were made in your legs: °¨ Avoid crossing your legs. °¨ Avoid  sitting for long periods of time. Change positions every 30 minutes. °¨ Raise (elevate) your legs when you are sitting. °Bathing  °· Do not take baths, swim, or use a hot tub until your health care provider approves. °· Only take sponge baths. Pat the incisions dry. Do not rub incisions with a washcloth or towel. °· Ask your health care provider when you can shower. °Eating and drinking  °· Eat foods that are high in fiber, such as raw fruits and vegetables, whole grains, beans, and nuts. Meats should be lean cut. Avoid canned, processed, and fried foods. This can help prevent constipation and is a recommended part of a heart-healthy diet. °· Drink enough fluid to keep your urine clear or pale yellow. °· Limit alcohol intake to no more than 1 drink a day for nonpregnant women and 2 drinks a day for men. One drink equals 12 oz of beer, 5 oz of wine, or 1½ oz of hard liquor. °Activity  °· Rest and limit your activity as told by your health care provider. You may be instructed to: °¨ Stop any activity right away if you have chest pain, shortness of breath, irregular heartbeats, or dizziness. Get help right away if you have any of these symptoms. °¨ Move around frequently for short periods or take short walks as directed by your health care provider. Gradually increase your activities. You may need physical therapy or cardiac rehabilitation to help strengthen your muscles and build your endurance. °¨ Avoid lifting, pushing, or pulling anything that is heavier than 10   lb (4.5 kg) for at least 6 weeks or as told by your health care provider. °· Do not drive until your health care provider approves. °· Ask your health care provider when you may return to work. °· Ask your health care provider when you may resume sexual activity. °General instructions  °· Do not use any products that contain nicotine or tobacco, such as cigarettes and e-cigarettes. If you need help quitting, ask your health care provider. °· Take 2-3 deep  breaths every few hours during the day, while you recover. This helps expand your lungs and prevent complications like pneumonia after surgery. °· If you were given a device called an incentive spirometer, use it several times a day to practice deep breathing. Support your chest with a pillow or your arms when you take deep breaths or cough. °· Wear compression stockings as told by your health care provider. These stockings help to prevent blood clots and reduce swelling in your legs. °· Weigh yourself every day. This helps identify if your body is holding (retaining) fluid that may make your heart and lungs work harder. °· Keep all follow-up visits as told by your health care provider. This is important. °Contact a health care provider if: °· You have more redness, swelling, or pain around any incision. °· You have more fluid or blood coming from any incision. °· Any incision feels warm to the touch. °· You have pus or a bad smell coming from any incision °· You have a fever. °· You have swelling in your ankles or legs. °· You have pain in your legs. °· You gain 2 lb (0.9 kg) or more a day. °· You are nauseous or you vomit. °· You have diarrhea. °Get help right away if: °· You have chest pain that spreads to your jaw or arms. °· You are short of breath. °· You have a fast or irregular heartbeat. °· You notice a "clicking" in your breastbone (sternum) when you move. °· You have numbness or weakness in your arms or legs. °· You feel dizzy or light-headed. °Summary °· After the procedure, it is common to have pain or discomfort in the incision areas. °· Do not take baths, swim, or use a hot tub until your health care provider approves. °· Gradually increase your activities. You may need physical therapy or cardiac rehabilitation to help strengthen your muscles and build your endurance. °· Weigh yourself every day. This helps identify if your body is holding (retaining) fluid that may make your heart and lungs work  harder. °This information is not intended to replace advice given to you by your health care provider. Make sure you discuss any questions you have with your health care provider. °Document Released: 08/06/2004 Document Revised: 12/07/2015 Document Reviewed: 12/07/2015 °Elsevier Interactive Patient Education © 2017 [employer] ° °

## 2016-05-13 NOTE — Care Management Note (Signed)
Case Management Note Marvetta Gibbons RN, BSN Unit 2W-Case Manager [phone]  Patient Details  Name: Daniel [name]. MRN: [id number] Date of Birth: [birth year]  Subjective/Objective:  Pt admitted NSTEMI- s/p CABG on 05/12/16                 Action/Plan: PTA pt lived at home with wife- independent- CM to follow for d/c needs  Expected Discharge Date:                  Expected Discharge Plan:  [place]  In-House Referral:     Discharge planning Services  CM Consult  Post Acute Care Choice:    Choice offered to:     DME Arranged:    DME Agency:     HH Arranged:    [place] Agency:     Status of Service:  In process, will continue to follow  If discussed at Long Length of Stay Meetings, dates discussed:    Additional Comments:  Dawayne Patricia, RN 05/13/2016, 3:37 PM

## 2016-05-13 NOTE — Progress Notes (Signed)
1 Day Post-Op Procedure(s) (LRB): CORONARY ARTERY BYPASS GRAFTING (CABG) TIMES THREE USING LEFT INTERNAL MAMMARY ARTERY AND RIGHT SAPHENOUS LEG VEIN HARVESTED ENDOSCOPICALLY (N/A) TRANSESOPHAGEAL ECHOCARDIOGRAM (TEE) (N/A) Subjective: Some soreness but overall feels well  Objective: Vital signs in last 24 hours: Temp:  [97.2 F (36.2 C)-99.9 F (37.7 C)] 98.2 F (36.8 C) (04/13 0700) Pulse Rate:  [70-94] 75 (04/13 0700) Cardiac Rhythm: Normal sinus rhythm;Heart block (04/13 0400) Resp:  [10-21] 13 (04/13 0700) BP: (82-119)/(59-86) 113/76 (04/13 0700) SpO2:  [96 %-100 %] 97 % (04/13 0700) Arterial Line BP: (86-131)/(46-100) 115/61 (04/13 0700) FiO2 (%):  [40 %-50 %] 40 % (04/12 1744) Weight:  [167 lb 12.3 oz (76.1 kg)] 167 lb 12.3 oz (76.1 kg) (04/13 0526)  Hemodynamic parameters for last 24 hours: PAP: (13-30)/(3-20) 19/10 CO:  [4 L/min-5.1 L/min] 4.2 L/min CI:  [2.1 L/min/m2-2.7 L/min/m2] 2.2 L/min/m2  Intake/Output from previous day: 04/12 0701 - 04/13 0700 In: 5364.4 [P.O.:240; I.V.:3169.4; Blood:375; NG/GT:30; IV Piggyback:1550] Out: 6415 [Urine:3440; Emesis/NG output:30; Blood:1570; Chest Tube:370] Intake/Output this shift: No intake/output data recorded.  General appearance: alert, cooperative and no distress Neurologic: intact Heart: regular rate and rhythm Lungs: diminished breath sounds bibasilar Abdomen: normal findings: soft, non-tender + pericardial rub  Lab Results:  Recent Labs  05/12/16 1749 05/12/16 1821 05/13/16 0345  WBC 10.3  --  10.3  HGB 11.8* 11.6* 11.2*  HCT 35.1* 34.0* 34.3*  PLT 99*  --  106*   BMET:  Recent Labs  05/12/16 1821 05/13/16 0345  NA 139 137  K 4.2 4.2  CL 104 105  CO2  --  25  GLUCOSE 115* 122*  BUN 18 19  CREATININE 0.60* 0.69  CALCIUM  --  8.2*    PT/INR:  Recent Labs  05/12/16 1306  LABPROT 17.8*  INR 1.45   ABG    Component Value Date/Time   PHART 7.310 (L) 05/12/2016 2210   HCO3 26.3 05/12/2016  2210   TCO2 28 05/12/2016 2210   ACIDBASEDEF 2.0 05/12/2016 2109   O2SAT 98.0 05/12/2016 2210   CBG (last 3)   Recent Labs  05/12/16 1952 05/12/16 2338 05/13/16 0343  GLUCAP 106* 115* 113*    Assessment/Plan: S/P Procedure(s) (LRB): CORONARY ARTERY BYPASS GRAFTING (CABG) TIMES THREE USING LEFT INTERNAL MAMMARY ARTERY AND RIGHT SAPHENOUS LEG VEIN HARVESTED ENDOSCOPICALLY (N/A) TRANSESOPHAGEAL ECHOCARDIOGRAM (TEE) (N/A) -CV- s/p CABG x 3, good hemodynamics  Dc swan and A line, wean dopamine  ASA, statin, beta blocker, add ACE-I in 24-48 hours  RESP_ COPD, IS for atelectasis  RENAL- creatinine and lytes OK  Diurese  ENDO- CBG well controlled  Thrombocytopenia- no bleeding issues, improving  SCD + enoxaparin for DVT prophylaxis  mobilize   LOS: 3 days    [name] 05/13/2016

## 2016-05-13 NOTE — Progress Notes (Signed)
      [address]       [place] [address]             [phone]       Sleeping at present  BP 111/74   Pulse 69   Temp 98.6 F (37 C) (Oral)   Resp 12   Ht 5\' 11"  (1.803 m)   Wt 167 lb 12.3 oz (76.1 kg)   SpO2 100%   BMI 23.40 kg/m    Intake/Output Summary (Last 24 hours) at 05/13/16 1727 Last data filed at 05/13/16 1400  Gross per 24 hour  Intake           1582.7 ml  Output             2085 ml  Net           -502.3 ml    Hct= 31, K= 3.9, creatinine 0.7  Doing well  Remo Lipps C. Roxan Hockey, MD [facility] [phone]

## 2016-05-13 NOTE — Discharge Summary (Signed)
Physician Discharge Summary  Patient ID: [name]. MRN: [id number] DOB/AGE: Mar 02, 1945 71 y.o.  Admit date: 05/09/2016 Discharge date: 05/17/2016  Admission Diagnoses: Patient Active Problem List   Diagnosis Date Noted  . Coronary artery disease 05/12/2016  . Non-ST elevation (NSTEMI) myocardial infarction [facility])     Discharge Diagnoses:  Active Problems:   Non-ST elevation (NSTEMI) myocardial infarction [facility])   Coronary artery disease   Discharged Condition: good  HPI:   71 yo man with history of tobacco abuse, COPD, hypertension, prostate cancer, hyperlipidemia, and GERD. Developed burning sensation in chest on 05/09/16. He thought it was heartburn and did not seek immediate medical attention. The pain persisted throughout the day and became progressively worse until he finally went to ED. Initial ECG ok but troponin > 65. Underwent cardiac cath which revealed severe 3 vessel CAD and severely impaired LV function with an EF ~ 20%. Echo showed mild AI, EF 25% with anterior, inferolateral and apical akinesis.    Hospital Course:  On 05/12/2016 Mr. Robidoux underwent a coronary bypass grafting 3. He tolerated the procedure well and was transferred to the ICU. He was extubated in a timely manner. He remained in normal sinus rhythm with stable hemodynamics. Postop day 1 he continued to progress. We initiated Lovenox for DVT prophylaxis. We initiated a diuretic regimen for fluid overload. There were no bleeding issues in the platelets continued to increase. We discontinued the Swan-Ganz catheter and the arterial line. He remained on some dopamine which we were able to wean. Once on the telemetry floor he continued to progress. On POD 4 he went into atrial fibrillation. We started IV amio and he converted. He is now in normal sinus rhythm, tolerating room air, his incisions are healing well, and he is ready for discharge home.   Consults: None  Significant Diagnostic Studies:  CLINICAL  DATA:  Status post coronary artery bypass graft.  EXAM: PORTABLE CHEST 1 VIEW  COMPARISON:  Radiograph of May 12, 2016.  FINDINGS: Stable cardiomegaly. Endotracheal and nasogastric tubes have been removed. Right internal jugular Swan-Ganz catheter is again noted with tip in expected position of main pulmonary artery. No pneumothorax is noted. Left-sided chest tube is unchanged in position. Stable left basilar atelectasis or infiltrate is noted with associated pleural effusion. Right lung is clear. Bony thorax is unremarkable.  IMPRESSION: Endotracheal and nasogastric tubes have been removed. Stable left-sided chest tube without pneumothorax. Left basilar atelectasis or infiltrate is noted with associated pleural effusion.   Electronically Signed   By: Marijo Conception, M.D.   On: 05/13/2016 08:42   Treatments:  NAME:  DONNY, HEFFERN                  ACCOUNT NO.:  [id number]  MEDICAL RECORD NO.:  [id number]  LOCATION:  [facility]                         FACILITY:  [facility]  PHYSICIAN:  Revonda Standard. Roxan Hockey, M.D.DATE OF BIRTH:  04/30/1945  DATE OF PROCEDURE:  05/12/2016 DATE OF DISCHARGE:                              OPERATIVE REPORT   PREOPERATIVE DIAGNOSES:  Three-vessel coronary disease, status post myocardial infarction.  POSTOPERATIVE DIAGNOSES:  Three-vessel coronary disease, status post myocardial infarction.  PROCEDURE:  Median sternotomy, extracorporeal circulation, coronary artery bypass grafting x3 (left internal mammary artery  to diagonal, saphenous vein graft to distal LAD, saphenous vein graft to posterior descending), and endoscopic vein harvest, right leg.  SURGEON:  Revonda Standard. Roxan Hockey, M.D.  ASSISTANT:  Lars Pinks, PA.  ANESTHESIA:  General.  FINDINGS:  Transesophageal echocardiography revealed dilated left ventricle with global hypokinesis with ejection fraction of approximately 25%.  Mild MR an AI.  Acute infarct on  lateral wall.  No graftable circumflex vessels.  The coronary is good quality targets at site of anastomoses, but diffusely diseased.  Discharge Exam: Blood pressure 122/79, pulse 79, temperature 97.7 F (36.5 C), temperature source Oral, resp. rate 18, height 5\' 11"  (1.803 m), weight 72.2 kg (159 lb 2.8 oz), SpO2 97 %.   General appearance: alert, cooperative and no distress Heart: regular rate and rhythm, S1, S2 normal, no murmur, click, rub or gallop Lungs: clear to auscultation bilaterally Abdomen: soft, non-tender; bowel sounds normal; no masses,  no organomegaly Extremities: extremities normal, atraumatic, no cyanosis or edema Wound: clean and dry. No drainage  Disposition: 01-Home or Self Care   Allergies as of 05/17/2016      Reactions   Amlodipine Other (See Comments)   LE swelling   Contrast Media [iodinated Diagnostic Agents] Other (See Comments)   Prostate problem had to wear a catheter for two weeks after having dye.    Hydralazine Palpitations, Other (See Comments)   Fatigue+      Medication List    STOP taking these medications   aspirin 81 MG tablet Replaced by:  aspirin 325 MG EC tablet   Fish Oil 500 MG Caps   hydrochlorothiazide 25 MG tablet Commonly known as:  HYDRODIURIL   ondansetron 4 MG tablet Commonly known as:  ZOFRAN   oxyCODONE-acetaminophen 5-325 MG tablet Commonly known as:  PERCOCET/ROXICET     TAKE these medications   ALPRAZolam 0.25 MG tablet Commonly known as:  XANAX TAKE ONE TABLET BY MOUTH AT BEDTIME AS NEEDED What changed:  See the new instructions.   amiodarone 200 MG tablet Commonly known as:  PACERONE Please take 2 tabs (400mg ) twice a day for 6 days then take 1 tab (200mg ) twice a day until we see you in the office.   aspirin 325 MG EC tablet Take 1 tablet (325 mg total) by mouth daily. Replaces:  aspirin 81 MG tablet   atorvastatin 80 MG tablet Commonly known as:  LIPITOR Take 1 tablet (80 mg total) by mouth daily  at 6 PM.   benazepril 20 MG tablet Commonly known as:  LOTENSIN Take 1 tablet (20 mg total) by mouth daily. What changed:  See the new instructions.   carvedilol 3.125 MG tablet Commonly known as:  COREG Take 1 tablet (3.125 mg total) by mouth 2 (two) times daily with a meal.   furosemide 20 MG tablet Commonly known as:  LASIX Take 1 tablet (20 mg total) by mouth daily.   oxyCODONE 5 MG immediate release tablet Commonly known as:  Oxy IR/ROXICODONE Take 1 tablet (5 mg total) by mouth every 6 (six) hours as needed for severe pain.   potassium chloride 10 MEQ tablet Commonly known as:  K-DUR,KLOR-CON Take 1 tablet (10 mEq total) by mouth daily.   tamsulosin 0.4 MG Caps capsule Commonly known as:  FLOMAX Take 1 capsule (0.4 mg total) by mouth daily.   Vitamin D (Cholecalciferol) 400 units Caps Take 400 Units by mouth daily.      Follow-up Information    MCGOWEN,PHILIP H, MD. Call in 1 day(s).  Specialty:  Family Medicine Contact information: [address] [place] [address] [phone]        [name], MD Follow up.   Specialty:  Cardiothoracic Surgery Why:  Your appointment is on 06/14/2016 at 10:30am. Please arrive at 10:00am for a chest xray at [facility] located on the first floor of our building Contact information: [place] [id number] [place], PA-C Follow up.   Specialties:  Physician Assistant, Cardiology Why:  Your appointment is on April 30 at 9:30 AM. Please bring a medication list. Contact information: [address] [place] [address] [phone]          The patient has been discharged on:   1.Beta Blocker:  Yes [ x  ]                              No   [   ]                              If No, reason:  2.Ace Inhibitor/ARB: Yes [ x  ]                                     No  [    ]                                     If No,  reason:  3.Statin:   Yes [ x  ]                  No  [   ]                  If No, reason:  4.Ecasa:  Yes  [ x  ]                  No   [   ]                  If No, reason:   Signed: Elgie Collard 05/17/2016, 8:44 AM

## 2016-05-14 ENCOUNTER — Inpatient Hospital Stay (HOSPITAL_COMMUNITY): Payer: Medicare Other

## 2016-05-14 DIAGNOSIS — I25110 Atherosclerotic heart disease of native coronary artery with unstable angina pectoris: Secondary | ICD-10-CM

## 2016-05-14 LAB — BASIC METABOLIC PANEL
ANION GAP: 7 (ref 5–15)
BUN: 25 mg/dL — ABNORMAL HIGH (ref 6–20)
CHLORIDE: 102 mmol/L (ref 101–111)
CO2: 26 mmol/L (ref 22–32)
Calcium: 8.4 mg/dL — ABNORMAL LOW (ref 8.9–10.3)
Creatinine, Ser: 0.77 mg/dL (ref 0.61–1.24)
GFR calc Af Amer: >60 mL/min (ref >60)
GLUCOSE: 90 mg/dL (ref 65–99)
POTASSIUM: 3.9 mmol/L (ref 3.5–5.1)
SODIUM: 135 mmol/L (ref 135–145)

## 2016-05-14 LAB — GLUCOSE, CAPILLARY
GLUCOSE-CAPILLARY: 124 mg/dL — AB (ref 65–99)
GLUCOSE-CAPILLARY: 91 mg/dL (ref 65–99)
Glucose-Capillary: 166 mg/dL — ABNORMAL HIGH (ref 65–99)
Glucose-Capillary: 86 mg/dL (ref 65–99)
Glucose-Capillary: 86 mg/dL (ref 65–99)
Glucose-Capillary: 97 mg/dL (ref 65–99)

## 2016-05-14 LAB — CBC
HEMATOCRIT: 33.2 % — AB (ref 39.0–52.0)
HEMOGLOBIN: 10.8 g/dL — AB (ref 13.0–17.0)
MCH: 30.6 pg (ref 26.0–34.0)
MCHC: 32.5 g/dL (ref 30.0–36.0)
MCV: 94.1 fL (ref 78.0–100.0)
Platelets: 115 10*3/uL — ABNORMAL LOW (ref 150–400)
RBC: 3.53 MIL/uL — ABNORMAL LOW (ref 4.22–5.81)
RDW: 13.7 % (ref 11.5–15.5)
WBC: 10.3 10*3/uL (ref 4.0–10.5)

## 2016-05-14 MED ORDER — CARVEDILOL 3.125 MG PO TABS
3.1250 mg | ORAL_TABLET | Freq: Two times a day (BID) | ORAL | Status: DC
  Administered 2016-05-14 – 2016-05-17 (×7): 3.125 mg via ORAL
  Filled 2016-05-14 (×7): qty 1

## 2016-05-14 MED ORDER — SODIUM CHLORIDE 0.9% FLUSH: 3.0000 mL | INTRAVENOUS | Status: DC | PRN

## 2016-05-14 MED ORDER — MAGNESIUM HYDROXIDE 400 MG/5ML PO SUSP: 30.0000 mL | Freq: Every day | ORAL | Status: DC | PRN

## 2016-05-14 MED ORDER — POTASSIUM CHLORIDE CRYS ER 20 MEQ PO TBCR
20.0000 meq | EXTENDED_RELEASE_TABLET | Freq: Two times a day (BID) | ORAL | Status: DC
  Administered 2016-05-14 – 2016-05-17 (×7): 20 meq via ORAL
  Filled 2016-05-14 (×7): qty 1

## 2016-05-14 MED ORDER — BENAZEPRIL HCL 5 MG PO TABS
20.0000 mg | ORAL_TABLET | Freq: Every day | ORAL | Status: DC
  Administered 2016-05-15 – 2016-05-17 (×3): 20 mg via ORAL
  Filled 2016-05-14 (×3): qty 4

## 2016-05-14 MED ORDER — PNEUMOCOCCAL VAC POLYVALENT 25 MCG/0.5ML IJ INJ
0.5000 mL | INJECTION | INTRAMUSCULAR | Status: DC
  Filled 2016-05-14: qty 0.5

## 2016-05-14 MED ORDER — GUAIFENESIN-DM 100-10 MG/5ML PO SYRP: 15.0000 mL | ORAL_SOLUTION | ORAL | Status: DC | PRN

## 2016-05-14 MED ORDER — ALUM & MAG HYDROXIDE-SIMETH 200-200-20 MG/5ML PO SUSP
15.0000 mL | ORAL | Status: DC | PRN
Start: 2016-05-14 — End: 2016-05-17

## 2016-05-14 MED ORDER — INSULIN ASPART 100 UNIT/ML ~~LOC~~ SOLN: 0.0000 [IU] | Freq: Three times a day (TID) | SUBCUTANEOUS | Status: DC

## 2016-05-14 MED ORDER — MOVING RIGHT ALONG BOOK
Freq: Once | Status: AC
  Administered 2016-05-14: 13:00:00
  Filled 2016-05-14: qty 1

## 2016-05-14 MED ORDER — FUROSEMIDE 40 MG PO TABS
40.0000 mg | ORAL_TABLET | Freq: Every day | ORAL | Status: DC
  Administered 2016-05-14 – 2016-05-17 (×4): 40 mg via ORAL
  Filled 2016-05-14 (×4): qty 1

## 2016-05-14 MED ORDER — SODIUM CHLORIDE 0.9% FLUSH
3.0000 mL | Freq: Two times a day (BID) | INTRAVENOUS | Status: DC
Start: 2016-05-14 — End: 2016-05-17
  Administered 2016-05-14 – 2016-05-16 (×4): 3 mL via INTRAVENOUS

## 2016-05-14 MED ORDER — SODIUM CHLORIDE 0.9 % IV SOLN: 250.0000 mL | INTRAVENOUS | Status: DC | PRN

## 2016-05-14 NOTE — Progress Notes (Signed)
2 Days Post-Op Procedure(s) (LRB): CORONARY ARTERY BYPASS GRAFTING (CABG) TIMES THREE USING LEFT INTERNAL MAMMARY ARTERY AND RIGHT SAPHENOUS LEG VEIN HARVESTED ENDOSCOPICALLY (N/A) TRANSESOPHAGEAL ECHOCARDIOGRAM (TEE) (N/A) Subjective: No complaints this morning  Objective: Vital signs in last 24 hours: Temp:  [97.6 F (36.4 C)-98.6 F (37 C)] 98.5 F (36.9 C) (04/14 0409) Pulse Rate:  [63-73] 72 (04/14 0600) Cardiac Rhythm: Normal sinus rhythm (04/14 0400) Resp:  [10-22] 13 (04/14 0600) BP: (102-122)/(64-79) 113/76 (04/14 0600) SpO2:  [90 %-100 %] 90 % (04/14 0600) Weight:  [167 lb 15.9 oz (76.2 kg)] 167 lb 15.9 oz (76.2 kg) (04/14 0500)  Hemodynamic parameters for last 24 hours:    Intake/Output from previous day: 04/13 0701 - 04/14 0700 In: 1271.7 [P.O.:660; I.V.:246.7; IV Piggyback:365] Out: 9417 [Urine:1025] Intake/Output this shift: No intake/output data recorded.  General appearance: alert, cooperative and no distress Neurologic: intact Heart: regular rate and rhythm Lungs: clear to auscultation bilaterally Abdomen: normal findings: soft, non-tender Wound: clean and dry  Lab Results:  Recent Labs  05/13/16 1603 05/13/16 1613 05/14/16 0418  WBC 10.3  --  10.3  HGB 10.8* 10.5* 10.8*  HCT 33.0* 31.0* 33.2*  PLT 112*  --  115*   BMET:  Recent Labs  05/13/16 0345  05/13/16 1613 05/14/16 0418  NA 137  --  135 135  K 4.2  --  3.9 3.9  CL 105  --  97* 102  CO2 25  --   --  26  GLUCOSE 122*  --  155* 90  BUN 19  --  26* 25*  CREATININE 0.69  < > 0.70 0.77  CALCIUM 8.2*  --   --  8.4*  < > = values in this interval not displayed.  PT/INR:  Recent Labs  05/12/16 1306  LABPROT 17.8*  INR 1.45   ABG    Component Value Date/Time   PHART 7.310 (L) 05/12/2016 2210   HCO3 26.3 05/12/2016 2210   TCO2 28 05/13/2016 1613   ACIDBASEDEF 2.0 05/12/2016 2109   O2SAT 98.0 05/12/2016 2210   CBG (last 3)   Recent Labs  05/13/16 2024 05/13/16 2342  05/14/16 0407  GLUCAP 123* 123* 86    Assessment/Plan: S/P Procedure(s) (LRB): CORONARY ARTERY BYPASS GRAFTING (CABG) TIMES THREE USING LEFT INTERNAL MAMMARY ARTERY AND RIGHT SAPHENOUS LEG VEIN HARVESTED ENDOSCOPICALLY (N/A) TRANSESOPHAGEAL ECHOCARDIOGRAM (TEE) (N/A) Plan for transfer to step-down: see transfer orders  POD # 2 doing well  CV- stable in SR, change metoprolol to coreg due to low EF  ASA, beta blocker  Restart ACE_I tomorrow  RESP- small effusions, IS  RENAL- creatinine and lytes OK  Change to PO lasix, dc Foley  ENDO- CBG OK change to AC/ HS  SCD + enoxaparin for DVT prophylaxis   LOS: 4 days    Daniel [name] 05/14/2016

## 2016-05-15 ENCOUNTER — Inpatient Hospital Stay (HOSPITAL_COMMUNITY): Payer: Medicare Other

## 2016-05-15 LAB — BASIC METABOLIC PANEL
ANION GAP: 7 (ref 5–15)
BUN: 23 mg/dL — ABNORMAL HIGH (ref 6–20)
CO2: 25 mmol/L (ref 22–32)
Calcium: 8.3 mg/dL — ABNORMAL LOW (ref 8.9–10.3)
Chloride: 104 mmol/L (ref 101–111)
Creatinine, Ser: 0.75 mg/dL (ref 0.61–1.24)
Glucose, Bld: 94 mg/dL (ref 65–99)
Potassium: 4 mmol/L (ref 3.5–5.1)
Sodium: 136 mmol/L (ref 135–145)

## 2016-05-15 LAB — CBC
HEMATOCRIT: 32.6 % — AB (ref 39.0–52.0)
HEMOGLOBIN: 11 g/dL — AB (ref 13.0–17.0)
MCH: 31 pg (ref 26.0–34.0)
MCHC: 33.7 g/dL (ref 30.0–36.0)
MCV: 91.8 fL (ref 78.0–100.0)
Platelets: 118 10*3/uL — ABNORMAL LOW (ref 150–400)
RBC: 3.55 MIL/uL — ABNORMAL LOW (ref 4.22–5.81)
RDW: 13.5 % (ref 11.5–15.5)
WBC: 8.4 10*3/uL (ref 4.0–10.5)

## 2016-05-15 LAB — GLUCOSE, CAPILLARY
GLUCOSE-CAPILLARY: 86 mg/dL (ref 65–99)
Glucose-Capillary: 103 mg/dL — ABNORMAL HIGH (ref 65–99)
Glucose-Capillary: 99 mg/dL (ref 65–99)
Glucose-Capillary: 95 mg/dL (ref 65–99)

## 2016-05-15 NOTE — Progress Notes (Signed)
Removed wires at  1330  with no resistance, and pt. Tolerated procedure well

## 2016-05-15 NOTE — Progress Notes (Addendum)
      [address]       [place] [address]             [phone]      3 Days Post-Op Procedure(s) (LRB): CORONARY ARTERY BYPASS GRAFTING (CABG) TIMES THREE USING LEFT INTERNAL MAMMARY ARTERY AND RIGHT SAPHENOUS LEG VEIN HARVESTED ENDOSCOPICALLY (N/A) TRANSESOPHAGEAL ECHOCARDIOGRAM (TEE) (N/A) Subjective: Feels okay. Wanting to go home soon.   Objective: Vital signs in last 24 hours: Temp:  [97.6 F (36.4 C)-98.2 F (36.8 C)] 97.6 F (36.4 C) (04/15 0500) Pulse Rate:  [76-86] 86 (04/15 0500) Cardiac Rhythm: Normal sinus rhythm (04/15 0701) Resp:  [13-18] 18 (04/15 0500) BP: (112-136)/(69-96) 136/79 (04/15 0500) SpO2:  [94 %-97 %] 97 % (04/15 0500) Weight:  [74.8 kg (164 lb 12.8 oz)] 74.8 kg (164 lb 12.8 oz) (04/15 0500)     Intake/Output from previous day: 04/14 0701 - 04/15 0700 In: 90 [I.V.:40; IV Piggyback:50] Out: 750 [Urine:750] Intake/Output this shift: No intake/output data recorded.  General appearance: alert, cooperative and no distress Heart: regular rate and rhythm, S1, S2 normal, no murmur, click, rub or gallop Lungs: clear to auscultation bilaterally Abdomen: soft, non-tender; bowel sounds normal; no masses,  no organomegaly Extremities: extremities normal, atraumatic, no cyanosis or edema Wound: sternal incision c/d/i as well as right EVH sites  Lab Results:  Recent Labs  05/14/16 0418 05/15/16 0234  WBC 10.3 8.4  HGB 10.8* 11.0*  HCT 33.2* 32.6*  PLT 115* 118*   BMET:  Recent Labs  05/14/16 0418 05/15/16 0234  NA 135 136  K 3.9 4.0  CL 102 104  CO2 26 25  GLUCOSE 90 94  BUN 25* 23*  CREATININE 0.77 0.75  CALCIUM 8.4* 8.3*    PT/INR:  Recent Labs  05/12/16 1306  LABPROT 17.8*  INR 1.45   ABG    Component Value Date/Time   PHART 7.310 (L) 05/12/2016 2210   HCO3 26.3 05/12/2016 2210   TCO2 28 05/13/2016 1613   ACIDBASEDEF 2.0 05/12/2016 2109   O2SAT 98.0 05/12/2016 2210   CBG (last 3)   Recent Labs  05/14/16 1736 05/14/16 2112 05/15/16 0608  GLUCAP 91 97 86    Assessment/Plan: S/P Procedure(s) (LRB): CORONARY ARTERY BYPASS GRAFTING (CABG) TIMES THREE USING LEFT INTERNAL MAMMARY ARTERY AND RIGHT SAPHENOUS LEG VEIN HARVESTED ENDOSCOPICALLY (N/A) TRANSESOPHAGEAL ECHOCARDIOGRAM (TEE) (N/A)  1. CV- stable NSR in the 80s with occasional PVCs. BP stable. On coreg, lotensin, lipitor, and lasix PO 2. Pulm-tolerating room air with good oxygen saturation. L > R pleural effusions, associated atelectasis.  3. Renal-creatinine 0.75, electrolytes okay. Making good urine, weight continued to trend down. Remains 6 lbs over baseline weight. Continue PO lasix 4. Endo-blood glucose level well controlled 5. Lovenox and SCDs for DVT prophylaxis  Plan: Okay to discontinue EPW. Rhythm has been stable. Continue diuretics for fluid overload. Ambulate in the halls today. Encouraged incentive spirometry. Possible home tomorrow if remains stable.     LOS: 5 days    Elgie Collard 05/15/2016 Patient seen and examined, agree with above Has a small left pleural effusion Overall doing well  Remo Lipps C. Roxan Hockey, MD [facility] [phone]

## 2016-05-16 DIAGNOSIS — I480 Paroxysmal atrial fibrillation: Secondary | ICD-10-CM

## 2016-05-16 LAB — GLUCOSE, CAPILLARY
GLUCOSE-CAPILLARY: 87 mg/dL (ref 65–99)
Glucose-Capillary: 108 mg/dL — ABNORMAL HIGH (ref 65–99)
Glucose-Capillary: 106 mg/dL — ABNORMAL HIGH (ref 65–99)

## 2016-05-16 LAB — MAGNESIUM: Magnesium: 1.7 mg/dL (ref 1.7–2.4)

## 2016-05-16 LAB — TSH: TSH: 1.181 u[IU]/mL (ref 0.350–4.500)

## 2016-05-16 MED ORDER — AMIODARONE IV BOLUS ONLY 150 MG/100ML
150.0000 mg | Freq: Once | INTRAVENOUS | Status: AC
  Administered 2016-05-16: 150 mg via INTRAVENOUS
  Filled 2016-05-16: qty 100

## 2016-05-16 MED ORDER — AMIODARONE HCL 200 MG PO TABS: 400.0000 mg | ORAL_TABLET | Freq: Every day | ORAL | Status: DC

## 2016-05-16 MED ORDER — AMIODARONE HCL 200 MG PO TABS
400.0000 mg | ORAL_TABLET | Freq: Two times a day (BID) | ORAL | Status: DC
  Administered 2016-05-16 – 2016-05-17 (×3): 400 mg via ORAL
  Filled 2016-05-16 (×3): qty 2

## 2016-05-16 MED ORDER — MAGNESIUM OXIDE 400 (241.3 MG) MG PO TABS
400.0000 mg | ORAL_TABLET | Freq: Every day | ORAL | Status: DC
  Administered 2016-05-16 – 2016-05-17 (×2): 400 mg via ORAL
  Filled 2016-05-16 (×2): qty 1

## 2016-05-16 NOTE — Progress Notes (Signed)
CARDIAC REHAB PHASE I   PRE:  Rate/Rhythm: 80 SR  BP:  Sitting: 106/67        SaO2: 96 RA  MODE:  Ambulation: 500 ft   POST:  Rate/Rhythm: 89 SR  BP:  Sitting: 122/67         SaO2: 98 RA  Pt ambulated 500 ft on RA, rolling walker, steady gait, tolerated well with no complaints. Pt maintained NSR, declined rest stop. Pt very steady, encouraged additional ambulation x2 today with family and/or staff. Encouraged IS. Pt to recliner after walk, feet elevated, call bell within reach. Will follow.   [phone] Lenna Sciara, RN, BSN 05/16/2016 11:35 AM

## 2016-05-16 NOTE — Consult Note (Signed)
[facility] CM Primary Care Navigator  05/16/2016  Romin Divita. 1945-03-05 [id number]   Met with patient, wife and sonat the bedside to identify possible discharge needs. Patient reports having "burning sensation to chest area" that had led to this admission. Patient endorses Dr. Shawnie Dapper with [facility] [place] as the primary care provider.   Patient shared using [facility] in [place] obtain medications without any problem.  Patient's wife states that he manages hisown medications at home straight out of the containers.   He reports being able to drive prior to admission. Sons (Ronnie or [place]) or nephew will be able to provide transportation to his doctors'appointments after discharge.  Patient's wife will be the primary caregiver at home and sister Vaughan Basta) can provide assistance with care as needed.  Anticipated discharge plan is home according to patient.    Patient, son and wife voiced understanding to call primary care provider's office when he gets home, for a post discharge follow-up appointment within a week or sooner if needs arise.Patient letter (with PCP's contact number) was provided as theirreminder.  Patient and wife denied anyfurther needs or concerns at this time.  For additional questions please contact:  Edwena Felty A. Jill Ruppe, BSN, RN-BC [facility] PRIMARY CARE Navigator Cell: [phone]

## 2016-05-16 NOTE — Progress Notes (Signed)
Progress Note  Patient Name: Daniel [name]. Date of Encounter: 05/16/2016  Primary Cardiologist: Dr. Claiborne Billings  Subjective   No chest pain or shortness of breath. He did felt palpitation when went into afib this morning, currently in sinus rhythm.   Inpatient Medications    Scheduled Meds: . acetaminophen  1,000 mg Oral Q6H  . amiodarone  400 mg Oral Q12H   Followed by  . [START ON 05/23/2016] amiodarone  400 mg Oral Daily  . aspirin EC  325 mg Oral Daily   Or  . aspirin  324 mg Per Tube Daily  . atorvastatin  80 mg Oral q1800  . benazepril  20 mg Oral Daily  . bisacodyl  10 mg Oral Daily  . carvedilol  3.125 mg Oral BID WC  . docusate sodium  200 mg Oral Daily  . enoxaparin (LOVENOX) injection  40 mg Subcutaneous QHS  . furosemide  40 mg Oral Daily  . insulin aspart  0-15 Units Subcutaneous TID WC  . mouth rinse  15 mL Mouth Rinse BID  . pantoprazole  40 mg Oral Daily  . pneumococcal 23 valent vaccine  0.5 mL Intramuscular Tomorrow-1000  . potassium chloride  20 mEq Oral BID  . sodium chloride flush  3 mL Intravenous Q12H  . tamsulosin  0.4 mg Oral Daily   Continuous Infusions:  PRN Meds: sodium chloride, ALPRAZolam, alum & mag hydroxide-simeth, guaiFENesin-dextromethorphan, magnesium hydroxide, ondansetron (ZOFRAN) IV, oxyCODONE, sodium chloride flush, traMADol   Vital Signs    Vitals:   05/15/16 1420 05/15/16 1435 05/15/16 2041 05/16/16 0639  BP: 105/77 113/71 129/69 134/88  Pulse: 80 79 89 89  Resp:   18 18  Temp:   98.3 F (36.8 C) 98.8 F (37.1 C)  TempSrc:   Oral Oral  SpO2: 99% 100% 97% 96%  Weight:    161 lb 12.8 oz (73.4 kg)  Height:        Intake/Output Summary (Last 24 hours) at 05/16/16 1020 Last data filed at 05/16/16 0500  Gross per 24 hour  Intake              840 ml  Output              150 ml  Net              690 ml   Filed Weights   05/14/16 0500 05/15/16 0500 05/16/16 0639  Weight: 167 lb 15.9 oz (76.2 kg) 164 lb 12.8 oz (74.8 kg)  161 lb 12.8 oz (73.4 kg)    Telemetry    Sinus rhythm with brief episode of afib this morning about 2-3 hours - Personally Reviewed  ECG    Non today   Physical Exam   GEN: No acute distress.   Neck: No JVD Cardiac: RRR, no murmurs, rubs, or gallops. Surgical scar.  Respiratory: Clear to auscultation bilaterally. GI: Soft, nontender, non-distended  MS: No edema; No deformity. Neuro:  Nonfocal  Psych: Normal affect   Labs    Chemistry Recent Labs Lab 05/13/16 0345 05/13/16 1603 05/13/16 1613 05/14/16 0418 05/15/16 0234  NA 137  --  135 135 136  K 4.2  --  3.9 3.9 4.0  CL 105  --  97* 102 104  CO2 25  --   --  26 25  GLUCOSE 122*  --  155* 90 94  BUN 19  --  26* 25* 23*  CREATININE 0.69 0.78 0.70 0.77 0.75  CALCIUM 8.2*  --   --  8.4* 8.3*  GFRNONAA >60 >60  --  >60 >60  GFRAA >60 >60  --  >60 >60  ANIONGAP 7  --   --  7 7     Hematology Recent Labs Lab 05/13/16 1603 05/13/16 1613 05/14/16 0418 05/15/16 0234  WBC 10.3  --  10.3 8.4  RBC 3.50*  --  3.53* 3.55*  HGB 10.8* 10.5* 10.8* 11.0*  HCT 33.0* 31.0* 33.2* 32.6*  MCV 94.3  --  94.1 91.8  MCH 30.9  --  30.6 31.0  MCHC 32.7  --  32.5 33.7  RDW 13.7  --  13.7 13.5  PLT 112*  --  115* 118*    Cardiac Enzymes Recent Labs Lab 05/09/16 2030 05/09/16 2159  TROPONINI >65.00* >65.00*   No results for input(s): TROPIPOC in the last 168 hours.   BNPNo results for input(s): BNP, PROBNP in the last 168 hours.   DDimer No results for input(s): DDIMER in the last 168 hours.   Radiology    Dg Chest 2 View  Result Date: 05/15/2016 CLINICAL DATA:  Three days postoperative from CABG. EXAM: CHEST  2 VIEW COMPARISON:  One-view chest x-ray 05/14/2016 FINDINGS: The right IJ sheath has been removed. The heart size is normal. Bilateral pleural effusions are present, left greater than right. Associated airspace disease likely reflects atelectasis. The upper lung fields are clear. The visualized soft tissues and  bony thorax are unremarkable. Epicardial pacing wires remain. IMPRESSION: 1. Left greater than right pleural effusions and associated atelectasis. 2. The lungs are otherwise clear. Electronically Signed   By: San Morelle M.D.   On: 05/15/2016 07:59    Cardiac Studies   Cath 05/09/16 Left Heart Cath and Coronary Angiography  Conclusion     Mid RCA lesion, 100 %stenosed.  Dist Cx lesion, 100 %stenosed.  Ost Cx to Prox Cx lesion, 50 %stenosed.  LM lesion, 30 %stenosed.  Prox LAD to Mid LAD lesion, 100 %stenosed.  The left ventricular ejection fraction is less than 25% by visual estimate.  LV end diastolic pressure is normal.  There is severe left ventricular systolic dysfunction.   Severe LV dysfunction with an ejection fraction of 20-25%. There was akinesis of the distal inferoapical segment with significant hypokinesis globally c/w an ischemic cardiomyopathy.  Severe multivessel CAD with 30% near ostial stenosis, 75% proximal, and total occlusion of a large LAD with distal collateralization; 50% proximal left circumflex stenoses with total occlusion of the distal circumflex; and chronic total occlusion of the mid RCA with collateralization distally via the RV marginal branch as well as via the left coronary injection.  RECOMMENDATION: Surgical consultation for CABG revascularization.    Transthoracic Echocardiography 05/10/16 Left ventricle: The cavity size was normal. Systolic function was   severely reduced. The estimated ejection fraction was in the   range of 25% to 30%. There is akinesis of the mid-apicalanterior,   inferolateral, and apical myocardium. Doppler parameters are   consistent with abnormal left ventricular relaxation (grade 1   diastolic dysfunction). - Aortic valve: There was mild regurgitation. - Aortic root: The aortic root was mildly dilated.  Impressions:  - Technically difficult; definity used; akinesis of the anterior,   mid/distal  inferolateral and apical walls with overall severely   reduced LV systolic function; slow flow noted at LV apex   (suggesting increased risk of thrombus formation); grade 1   diastolic dysfunction; mild AI.   TEE 05/12/16  Left ventricle: LV systolic function is severely reduced with an  EF of 20-25%.  Aortic valve: Mild regurgitation.  Mitral valve: Mild regurgitation.   Patient Profile     71 y.o. male history of tobacco abuse, hypertension, and hyperlipidemia (statin intolerant) who developed substernal chest burning which she thought was reflux. He was seen at [facility] where his troponins were elevated. [facility]. He had ongoing chest pain and was taken to the Cath Lab by Dr. Claiborne Billings who demonstrated three-vessel disease and severe LV dysfunction. His anatomy was most suitable for coronary artery bypass grafting. S/p CABG x 3 05/12/16   Assessment & Plan    1. CAD s/p CABG x 3 05/12/16 - Chest pain free. Continue ASA, statin BB and ACE.   2. ICM - EF of 25-30% by echo. TEE showed EF of 20-25%. Continue BB, ACe and lasix. Will need repeat echo in few months. euvolemic on exam.   3. PAF - brief episode this morning. Now back in sinus rhythm. Continue amiodarone (started today). CHADSVACS score of 4. anticoagulation per MD. He did have hx of afib in 2013.  4. HTN - Stable on current regimen  5. HLD - 03/30/2016: Cholesterol 163; HDL 34.50; LDL Cholesterol 112; Triglycerides 83.0; VLDL 16.6  - Continue statin.   Signed, Leanor Kail, PA  05/16/2016, 10:20 AM  Patient seen and examined. Agree with assessment and plan. Day 4 s/p CABG x3 after presenting with CP one week ago with cath done by me. Pre-op EF 20 - 25% c/w an ischemic cardiomyopathy.  He went into transient AF this am, now on amiodarone and has converted back to NSR now in th 80s. He was started back on ACE-I on 4/15. Will monitor HR, if BP allow may be  able to titrate carvedilol. Would hold off on anticoagulation presently since in the perioperative period and episode was short lived and already converted to sinus.  However discussed potential if recurrent events.   [name], MD, [facility] 05/16/2016 10:34 AM

## 2016-05-16 NOTE — Progress Notes (Signed)
Amiodarone Drug - Drug Interaction Consult Note  Recommendations: Monitor for muscle pain/weakness on atorvastatin. Monitor for bradycardia on Coreg. Monitor potassium levels closely on Lasix. Amiodarone is metabolized by the cytochrome P450 system and therefore has the potential to cause many drug interactions. Amiodarone has an average plasma half-life of 50 days (range 20 to 100 days).   There is potential for drug interactions to occur several weeks or months after stopping treatment and the onset of drug interactions may be slow after initiating amiodarone.   [x]  Statins: Increased risk of myopathy. Simvastatin- restrict  dose to 20mg  daily. Other statins: counsel patients to report any muscle pain or weakness immediately. Atorvastatin  []  Anticoagulants: Amiodarone can increase anticoagulant effect. Consider warfarin dose reduction. Patients should be monitored closely and the dose of anticoagulant altered accordingly, remembering that amiodarone levels take several weeks to stabilize.  []  Antiepileptics: Amiodarone can increase plasma concentration of phenytoin, the dose should be reduced. Note that small changes in phenytoin dose can result in large changes in levels. Monitor patient and counsel on signs of toxicity.  [x]  Beta blockers: increased risk of bradycardia, AV block and myocardial depression. Sotalol - avoid concomitant use. Coreg  []   Calcium channel blockers (diltiazem and verapamil): increased risk of bradycardia, AV block and myocardial depression.  []   Cyclosporine: Amiodarone increases levels of cyclosporine. Reduced dose of cyclosporine is recommended.  []  Digoxin dose should be halved when amiodarone is started.  [x]  Diuretics: increased risk of cardiotoxicity if hypokalemia occurs.  Lasix  []  Oral hypoglycemic agents (glyburide, glipizide, glimepiride): increased risk of hypoglycemia. Patient's glucose levels should be monitored closely when initiating amiodarone  therapy.   []  Drugs that prolong the QT interval:  Torsades de pointes risk may be increased with concurrent use - avoid if possible.  Monitor QTc, also keep magnesium/potassium WNL if concurrent therapy can't be avoided. [name] Antibiotics: e.g. fluoroquinolones, erythromycin. . Antiarrhythmics: e.g. quinidine, procainamide, disopyramide,  sotalol. . Antipsychotics: e.g. phenothiazines, haloperidol.  . Lithium, tricyclic antidepressants, and methadone. Thank You,  Elicia Lamp, PharmD, BCPS Clinical Pharmacist 05/16/2016 8:50 AM

## 2016-05-16 NOTE — Progress Notes (Addendum)
[address]       [place] [address]             [phone]      4 Days Post-Op Procedure(s) (LRB): CORONARY ARTERY BYPASS GRAFTING (CABG) TIMES THREE USING LEFT INTERNAL MAMMARY ARTERY AND RIGHT SAPHENOUS LEG VEIN HARVESTED ENDOSCOPICALLY (N/A) TRANSESOPHAGEAL ECHOCARDIOGRAM (TEE) (N/A) Subjective: Just went into afib, rate 120's- has previous history of afib  Objective: Vital signs in last 24 hours: Temp:  [97.9 F (36.6 C)-98.8 F (37.1 C)] 98.8 F (37.1 C) (04/16 0639) Pulse Rate:  [79-89] 89 (04/16 0639) Cardiac Rhythm: Normal sinus rhythm (04/15 1900) Resp:  [18-20] 18 (04/16 0639) BP: (105-134)/(69-88) 134/88 (04/16 0639) SpO2:  [91 %-100 %] 96 % (04/16 0639) Weight:  [161 lb 12.8 oz (73.4 kg)] 161 lb 12.8 oz (73.4 kg) (04/16 0639)  Hemodynamic parameters for last 24 hours:    Intake/Output from previous day: 04/15 0701 - 04/16 0700 In: 1080 [P.O.:1080] Out: 450 [Urine:450] Intake/Output this shift: No intake/output data recorded.  General appearance: alert, cooperative and no distress Heart: irregularly irregular rhythm and tachy Lungs: mildly dim in bases Abdomen: soft, nontender Extremities: min edema Wound: incis healing well  Lab Results:  Recent Labs  05/14/16 0418 05/15/16 0234  WBC 10.3 8.4  HGB 10.8* 11.0*  HCT 33.2* 32.6*  PLT 115* 118*   BMET:  Recent Labs  05/14/16 0418 05/15/16 0234  NA 135 136  K 3.9 4.0  CL 102 104  CO2 26 25  GLUCOSE 90 94  BUN 25* 23*  CREATININE 0.77 0.75  CALCIUM 8.4* 8.3*    PT/INR: No results for input(s): LABPROT, INR in the last 72 hours. ABG    Component Value Date/Time   PHART 7.310 (L) 05/12/2016 2210   HCO3 26.3 05/12/2016 2210   TCO2 28 05/13/2016 1613   ACIDBASEDEF 2.0 05/12/2016 2109   O2SAT 98.0 05/12/2016 2210   CBG (last 3)   Recent Labs  05/15/16 1629 05/15/16 2109 05/16/16 0633  GLUCAP 99 95 87    Meds Scheduled Meds: . acetaminophen  1,000 mg  Oral Q6H  . aspirin EC  325 mg Oral Daily   Or  . aspirin  324 mg Per Tube Daily  . atorvastatin  80 mg Oral q1800  . benazepril  20 mg Oral Daily  . bisacodyl  10 mg Oral Daily  . carvedilol  3.125 mg Oral BID WC  . docusate sodium  200 mg Oral Daily  . enoxaparin (LOVENOX) injection  40 mg Subcutaneous QHS  . furosemide  40 mg Oral Daily  . insulin aspart  0-15 Units Subcutaneous TID WC  . mouth rinse  15 mL Mouth Rinse BID  . pantoprazole  40 mg Oral Daily  . pneumococcal 23 valent vaccine  0.5 mL Intramuscular Tomorrow-1000  . potassium chloride  20 mEq Oral BID  . sodium chloride flush  3 mL Intravenous Q12H  . tamsulosin  0.4 mg Oral Daily   Continuous Infusions: PRN Meds:.sodium chloride, ALPRAZolam, alum & mag hydroxide-simeth, guaiFENesin-dextromethorphan, magnesium hydroxide, ondansetron (ZOFRAN) IV, oxyCODONE, sodium chloride flush, traMADol  Xrays Dg Chest 2 View  Result Date: 05/15/2016 CLINICAL DATA:  Three days postoperative from CABG. EXAM: CHEST  2 VIEW COMPARISON:  One-view chest x-ray 05/14/2016 FINDINGS: The right IJ sheath has been removed. The heart size is normal. Bilateral pleural effusions are present, left greater than right. Associated airspace disease likely reflects atelectasis. The upper lung fields are  clear. The visualized soft tissues and bony thorax are unremarkable. Epicardial pacing wires remain. IMPRESSION: 1. Left greater than right pleural effusions and associated atelectasis. 2. The lungs are otherwise clear. Electronically Signed   By: San Morelle M.D.   On: 05/15/2016 07:59    Assessment/Plan: S/P Procedure(s) (LRB): CORONARY ARTERY BYPASS GRAFTING (CABG) TIMES THREE USING LEFT INTERNAL MAMMARY ARTERY AND RIGHT SAPHENOUS LEG VEIN HARVESTED ENDOSCOPICALLY (N/A) TRANSESOPHAGEAL ECHOCARDIOGRAM (TEE) (N/A)  1 doing well overall 2 back in atrial fibrillation( previous history) , will add amiodarone, may need to increase beta blocker  dose, recheck lytes/TSH 3 routine rehab and pulm toilet 4 sugars fairly well controlled- no h/o dm- will add aic to labs 5 poss add ace inhib soon, BP low at times(relative)  LOS: 6 days    GOLD,WAYNE E 05/16/2016 Atrial fib this morning, now back in SR after starting PO amiodarone Will supplement Mg as it was at bottom of normal range  Remo Lipps C. Roxan Hockey, MD [facility] [phone]

## 2016-05-16 NOTE — Care Management Important Message (Signed)
Important Message  Patient Details  Name: Daniel [name]. MRN: [id number] Date of Birth: 01-13-1946   Medicare Important Message Given:  Yes    Nathen May 05/16/2016, 2:37 PM

## 2016-05-17 LAB — GLUCOSE, CAPILLARY
Glucose-Capillary: 85 mg/dL (ref 65–99)
Glucose-Capillary: 102 mg/dL — ABNORMAL HIGH (ref 65–99)

## 2016-05-17 LAB — BASIC METABOLIC PANEL
Anion gap: 10 (ref 5–15)
BUN: 22 mg/dL — ABNORMAL HIGH (ref 6–20)
CALCIUM: 8.3 mg/dL — AB (ref 8.9–10.3)
CHLORIDE: 104 mmol/L (ref 101–111)
CO2: 22 mmol/L (ref 22–32)
Creatinine, Ser: 0.77 mg/dL (ref 0.61–1.24)
GFR calc Af Amer: >60 mL/min (ref >60)
Glucose, Bld: 95 mg/dL (ref 65–99)
POTASSIUM: 3.7 mmol/L (ref 3.5–5.1)
SODIUM: 136 mmol/L (ref 135–145)

## 2016-05-17 MED ORDER — FUROSEMIDE 20 MG PO TABS: 20.0000 mg | ORAL_TABLET | Freq: Every day | ORAL | 1 refills | Status: DC

## 2016-05-17 MED ORDER — POTASSIUM CHLORIDE CRYS ER 10 MEQ PO TBCR: 10.0000 meq | EXTENDED_RELEASE_TABLET | Freq: Every day | ORAL | 1 refills | Status: DC

## 2016-05-17 MED ORDER — OXYCODONE HCL 5 MG PO TABS: 5.0000 mg | ORAL_TABLET | Freq: Four times a day (QID) | ORAL | 0 refills | Status: DC | PRN

## 2016-05-17 MED ORDER — ATORVASTATIN CALCIUM 80 MG PO TABS: 80.0000 mg | ORAL_TABLET | Freq: Every day | ORAL | 1 refills | Status: DC

## 2016-05-17 MED ORDER — ASPIRIN 325 MG PO TBEC: 325.0000 mg | DELAYED_RELEASE_TABLET | Freq: Every day | ORAL | 0 refills | Status: DC

## 2016-05-17 MED ORDER — BENAZEPRIL HCL 20 MG PO TABS: 20.0000 mg | ORAL_TABLET | Freq: Every day | ORAL | 1 refills | Status: DC

## 2016-05-17 MED ORDER — CARVEDILOL 3.125 MG PO TABS: 3.1250 mg | ORAL_TABLET | Freq: Two times a day (BID) | ORAL | 1 refills | Status: DC

## 2016-05-17 MED ORDER — AMIODARONE HCL 200 MG PO TABS: ORAL_TABLET | ORAL | 0 refills | Status: DC

## 2016-05-17 NOTE — Progress Notes (Signed)
[id number] Education completed with pt and wife and son who voiced understanding. Encouraged IS and walking for ex. Discussed smoking cessation and gave fake cigarette and handout. Discussed weighing daily and watching sodium with low EF. Gave low sodium and heart healthy diets. Discussed CRP 2 and will refer to [place]. Pt did not want to watch discharge video. Pt stated he has walker at home if needed. Graylon Good RN BSN 05/17/2016 11:59 AM

## 2016-05-17 NOTE — Care Management Note (Signed)
Case Management Note Marvetta Gibbons RN, BSN Unit 2W-Case Manager [phone]  Patient Details  Name: Daniel [name]. MRN: [id number] Date of Birth: Mar 21, 1945  Subjective/Objective:  Pt admitted NSTEMI- s/p CABG on 05/12/16                 Action/Plan: PTA pt lived at home with wife- independent- CM to follow for d/c needs  Expected Discharge Date:  05/17/16               Expected Discharge Plan:  [place]  In-House Referral:     Discharge planning Services  CM Consult  Post Acute Care Choice:  NA Choice offered to:  NA  DME Arranged:  N/A DME Agency:  NA  HH Arranged:  NA HH Agency:  NA  Status of Service:  Completed, signed off  If discussed at [place] of Stay Meetings, dates discussed:    Discharge Disposition: home/self care   Additional Comments:  05/17/16- 0945- Steffanie Dunn Rease Swinson rN, CM- pt for d/c home today- no CM needs noted for discharge.   Dawayne Patricia, RN 05/17/2016, 9:49 AM

## 2016-05-17 NOTE — Progress Notes (Addendum)
      [address]       [place] [address]             [phone]      5 Days Post-Op Procedure(s) (LRB): CORONARY ARTERY BYPASS GRAFTING (CABG) TIMES THREE USING LEFT INTERNAL MAMMARY ARTERY AND RIGHT SAPHENOUS LEG VEIN HARVESTED ENDOSCOPICALLY (N/A) TRANSESOPHAGEAL ECHOCARDIOGRAM (TEE) (N/A) Subjective: No issues over night. He shares he slept well last night.   Objective: Vital signs in last 24 hours: Temp:  [97.7 F (36.5 C)-97.8 F (36.6 C)] 97.7 F (36.5 C) (04/17 0554) Pulse Rate:  [79-83] 79 (04/17 0554) Cardiac Rhythm: Normal sinus rhythm (04/16 1900) Resp:  [18] 18 (04/17 0554) BP: (94-122)/(38-79) 122/79 (04/17 0554) SpO2:  [97 %-99 %] 97 % (04/17 0554) Weight:  [72.2 kg (159 lb 2.8 oz)] 72.2 kg (159 lb 2.8 oz) (04/17 0554)     Intake/Output from previous day: 04/16 0701 - 04/17 0700 In: 480 [P.O.:480] Out: 500 [Urine:500] Intake/Output this shift: No intake/output data recorded.  General appearance: alert, cooperative and no distress Heart: regular rate and rhythm, S1, S2 normal, no murmur, click, rub or gallop Lungs: clear to auscultation bilaterally Abdomen: soft, non-tender; bowel sounds normal; no masses,  no organomegaly Extremities: extremities normal, atraumatic, no cyanosis or edema Wound: clean and dry. No drainage  Lab Results:  Recent Labs  05/15/16 0234  WBC 8.4  HGB 11.0*  HCT 32.6*  PLT 118*   BMET:  Recent Labs  05/15/16 0234 05/17/16 0206  NA 136 136  K 4.0 3.7  CL 104 104  CO2 25 22  GLUCOSE 94 95  BUN 23* 22*  CREATININE 0.75 0.77  CALCIUM 8.3* 8.3*    PT/INR: No results for input(s): LABPROT, INR in the last 72 hours. ABG    Component Value Date/Time   PHART 7.310 (L) 05/12/2016 2210   HCO3 26.3 05/12/2016 2210   TCO2 28 05/13/2016 1613   ACIDBASEDEF 2.0 05/12/2016 2109   O2SAT 98.0 05/12/2016 2210   CBG (last 3)   Recent Labs  05/16/16 1615 05/16/16 2112 05/17/16 0552  GLUCAP 108*  106* 85    Assessment/Plan: S/P Procedure(s) (LRB): CORONARY ARTERY BYPASS GRAFTING (CABG) TIMES THREE USING LEFT INTERNAL MAMMARY ARTERY AND RIGHT SAPHENOUS LEG VEIN HARVESTED ENDOSCOPICALLY (N/A) TRANSESOPHAGEAL ECHOCARDIOGRAM (TEE) (N/A)  1. CV- NSR in the 70s. BP well controlled. On Amio 400mg  PO. ACEI, Coreg, Lipitor and Lasix. 2. Pulm-CXR okay. Tolerating room air 3. Renal-Electrolytes okay. Creatinine stable 0.77 4. H and H stable 5. Endo-blood glucose level well controlled 6. Pain well controlled.   Plan: Rhythm stable. Weight is down to baseline. Discharge today.     LOS: 7 days    Elgie Collard 05/17/2016  Patient seen and examined, agree with above Home today  [place]. Roxan Hockey, MD [facility] [phone]

## 2016-05-18 ENCOUNTER — Telehealth: Payer: Self-pay

## 2016-05-18 ENCOUNTER — Encounter: Payer: Self-pay | Admitting: Family Medicine

## 2016-05-18 NOTE — Telephone Encounter (Signed)
Noted--phone contact with pt for transitional care f/u.  Signed:  Crissie Sickles, MD           05/18/2016

## 2016-05-18 NOTE — Telephone Encounter (Signed)
Transition Care Management Follow-up Telephone Call   Date discharged? 05/17/2016   How have you been since you were released from the hospital? A lot better   Do you understand why you were in the hospital? yes, "Open heart surgery for 3 blocks"   Do you understand the discharge instructions? yes   Where were you discharged to? Home.    Items Reviewed:  Medications reviewed: yes  Allergies reviewed: yes  Dietary changes reviewed: yes, low sodium   Referrals reviewed: yes, Cardiology f/u 05/30/16   Functional Questionnaire:   Activities of Daily Living (ADLs):   He states they are independent in the following: ambulation, bathing and hygiene, feeding, continence, grooming, toileting and dressing States they require assistance with the following: None.   Any transportation issues/concerns?: Needs Friday appt d/t son's work schedule   Any patient concerns? no   Confirmed importance and date/time of follow-up visits scheduled yes  Provider Appointment booked with PCP on Friday, 05/20/16 @ 08:30  Confirmed with patient if condition begins to worsen call PCP or go to the ER.  Patient was given the office number and encouraged to call back with question or concerns.  : yes

## 2016-05-19 ENCOUNTER — Telehealth (HOSPITAL_COMMUNITY): Payer: Self-pay | Admitting: Family Medicine

## 2016-05-19 NOTE — Telephone Encounter (Signed)
Verified [employer] Medicare insurance benefits through [employer]. Copay $20.00, No Coinsurance or Deductible Out of Pocket $4500.00 pt has met $50.00 Reference [phone]... KJ

## 2016-05-20 ENCOUNTER — Ambulatory Visit (INDEPENDENT_AMBULATORY_CARE_PROVIDER_SITE_OTHER): Payer: Medicare Other | Admitting: Family Medicine

## 2016-05-20 ENCOUNTER — Encounter: Payer: Self-pay | Admitting: Family Medicine

## 2016-05-20 VITALS — BP 131/74 | HR 61 | Temp 97.7°F | Resp 16 | Ht 69.5 in | Wt 163.8 lb

## 2016-05-20 DIAGNOSIS — D62 Acute posthemorrhagic anemia: Secondary | ICD-10-CM | POA: Diagnosis not present

## 2016-05-20 DIAGNOSIS — I2510 Atherosclerotic heart disease of native coronary artery without angina pectoris: Secondary | ICD-10-CM | POA: Diagnosis not present

## 2016-05-20 DIAGNOSIS — I214 Non-ST elevation (NSTEMI) myocardial infarction: Secondary | ICD-10-CM

## 2016-05-20 DIAGNOSIS — D696 Thrombocytopenia, unspecified: Secondary | ICD-10-CM

## 2016-05-20 DIAGNOSIS — I4891 Unspecified atrial fibrillation: Secondary | ICD-10-CM

## 2016-05-20 DIAGNOSIS — I255 Ischemic cardiomyopathy: Secondary | ICD-10-CM

## 2016-05-20 LAB — CBC WITH DIFFERENTIAL/PLATELET
Basophils Absolute: 0.1 10*3/uL (ref 0.0–0.1)
Basophils Relative: 0.9 % (ref 0.0–3.0)
EOS PCT: 3.4 % (ref 0.0–5.0)
Eosinophils Absolute: 0.3 10*3/uL (ref 0.0–0.7)
HCT: 36.1 % — ABNORMAL LOW (ref 39.0–52.0)
HEMOGLOBIN: 12 g/dL — AB (ref 13.0–17.0)
Lymphocytes Relative: 13.1 % (ref 12.0–46.0)
Lymphs Abs: 1.1 10*3/uL (ref 0.7–4.0)
MCHC: 33.2 g/dL (ref 30.0–36.0)
MCV: 94.7 fl (ref 78.0–100.0)
MONO ABS: 0.7 10*3/uL (ref 0.1–1.0)
Monocytes Relative: 7.8 % (ref 3.0–12.0)
Neutro Abs: 6.5 10*3/uL (ref 1.4–7.7)
Neutrophils Relative %: 74.8 % (ref 43.0–77.0)
Platelets: 311 10*3/uL (ref 150.0–400.0)
RBC: 3.81 Mil/uL — AB (ref 4.22–5.81)
RDW: 14.1 % (ref 11.5–15.5)
WBC: 8.7 10*3/uL (ref 4.0–10.5)

## 2016-05-20 NOTE — Progress Notes (Signed)
Pre visit review using our clinic review tool, if applicable. No additional management support is needed unless otherwise documented below in the visit note. 

## 2016-05-20 NOTE — Progress Notes (Signed)
05/20/2016  CC:  Chief Complaint  Patient presents with  . Hospitalization Follow-up    TCM    Patient is a 71 y.o. Caucasian male who presents for  hospital follow up, specifically Transitional Care Services face-to-face visit. Dates hospitalized: 4/9-4/17, 2018. Days since d/c from hospital: 3 Patient was discharged from hospital to home. Reason for admission to hospital:  Chest pain--non STEMI, CAD--he underwent CABG x 3. He had brief post-op a-fib but converted back to sinus on IV amio. Date of interactive (phone) contact with patient and/or caregiver: 05/18/16.  I have reviewed patient's discharge summary plus pertinent specific notes, labs, and imaging from the hospitalization.    Doing well.  No CP.  Sternum not bothering him.  No SOB.  No cough, no fever.  Drinking and eating well.  Strength is coming back well.  Having normal BMs.  He has been walking a decent amount. No acute complaints.  Medication reconciliation was done today and patient is taking meds as recommended by discharging hospitalist/specialist.    PMH:  Past Medical History:  Diagnosis Date  . Anxiety   . Atrial fibrillation ([place])    Limited episode, emergency room, June, 2013, spontaneous conversion to sinus rhythm, was evaluated By [name] 2013- no further visits.  [name] BPH (benign prostatic hypertrophy) with urinary obstruction    Nocturia is his primary symptom.  Acute urinary retention occurred when he got CT with contrast fall 2016 ([name]).  [name] CAD (coronary artery disease) 05/2016   3 V dz, non-STEMI, impaired LV fxn---underwent bipass surgery 05/2016.  [name] COPD (chronic obstructive pulmonary disease) ([place]) fall 2016   Bullous changes noted on lower lung images of CT abd done by urology  . Diverticulitis   . GERD (gastroesophageal reflux disease)   . Has received pneumococcal vaccination   . Hyperlipidemia    statin intolerant  . Hypertension   . Microscopic hematuria Fall 2016   CT nl except  nonobstructing stones.  Cystoscopy normal 12/30/14 ([name])  . Nephrolithiasis    11 mm stone on R, 2 mm stone on L, ureters clear  . Non-STEMI (non-ST elevated myocardial infarction) ([place]) 05/2016   with impaired LV function  . 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  Urol ([name]) at [facility]; [name] referred him to [name] 03/2015: patient got bilat nerve sparing , robot assisted laparoscopic radical prostatectomy and pelvic lymphadenectomy.  As of 10/21/15 urol f/u, PSA undetectable (repeat 04/22/16 result pending as per urol f/u that day), pt chose close PSA surveillance over adjuvant radiation tx.  . Tobacco dependence    down to 1-2 cigs per day as of 11/2015  . Urinary retention 05/2015   occurred s/p foley removal    PSH:  Past Surgical History:  Procedure Laterality Date  . aortic ultrasound  07/2012   NO AAA  . COLONOSCOPY  approx 2011 per pt   Done by surgeon in [place] after a bout of diverticulitis; normal per pt report--was told to repeat in 10 yrs.  . CORONARY ARTERY BYPASS GRAFT N/A 05/12/2016   Procedure: CORONARY ARTERY BYPASS GRAFTING (CABG) TIMES THREE USING LEFT INTERNAL MAMMARY ARTERY AND RIGHT SAPHENOUS LEG VEIN HARVESTED ENDOSCOPICALLY;  Surgeon: [name], MD;  Location: [facility];  Service: Open Heart Surgery;  Laterality: N/A;  . INGUINAL HERNIA REPAIR  2003 and 2004   Both sides.  [name] LEFT HEART CATH AND CORONARY ANGIOGRAPHY N/A 05/09/2016  Procedure: Left Heart Cath and Coronary Angiography;  Surgeon: [name], MD;  Location: [facility];  Service: Cardiovascular;  Laterality: N/A;  . LITHOTRIPSY  2004   Dr. Maryland Pink in [place].  [name] LYMPHADENECTOMY Bilateral 04/30/2015   Procedure: PELVIC LYMPHADENECTOMY;  Surgeon: [name], MD;  Location: [facility];  Service: Urology;  Laterality: Bilateral;  . PROSTATE BIOPSY  02/05/15   Prostate adenocarcinoma: pt considering treatment options as of  02/19/15  . ROBOT ASSISTED LAPAROSCOPIC RADICAL PROSTATECTOMY N/A 04/30/2015   Procedure: XI ROBOTIC ASSISTED LAPAROSCOPIC RADICAL PROSTATECTOMY LEVEL 2;  Surgeon: [name], MD;  Location: [facility];  Service: Urology;  Laterality: N/A;  . TEE WITHOUT CARDIOVERSION N/A 05/12/2016   Procedure: TRANSESOPHAGEAL ECHOCARDIOGRAM (TEE);  Surgeon: [name], MD;  Location: [facility];  Service: Open Heart Surgery;  Laterality: N/A;  . TRANSTHORACIC ECHOCARDIOGRAM  03/22/2006; 05/2016   2008: EF 65%, normal valves, no wall motion abnormalties, LA size normal.  05/2016 in context of non-STEMI, EF 20-25%, mild AV and MV regurg.    MEDS:  Outpatient Medications Prior to Visit  Medication Sig Dispense Refill  . ALPRAZolam (XANAX) 0.25 MG tablet TAKE ONE TABLET BY MOUTH AT BEDTIME AS NEEDED (Patient taking differently: TAKE 0.125 MG BY MOUTH DAILY AS NEEDED FOR ANXIETY) 30 tablet 3  . amiodarone (PACERONE) 200 MG tablet Please take 2 tabs (400mg ) twice a day for 6 days then take 1 tab (200mg ) twice a day until we see you in the office. 70 tablet 0  . aspirin EC 325 MG EC tablet Take 1 tablet (325 mg total) by mouth daily. 30 tablet 0  . atorvastatin (LIPITOR) 80 MG tablet Take 1 tablet (80 mg total) by mouth daily at 6 PM. 30 tablet 1  . benazepril (LOTENSIN) 20 MG tablet Take 1 tablet (20 mg total) by mouth daily. 30 tablet 1  . carvedilol (COREG) 3.125 MG tablet Take 1 tablet (3.125 mg total) by mouth 2 (two) times daily with a meal. 30 tablet 1  . furosemide (LASIX) 20 MG tablet Take 1 tablet (20 mg total) by mouth daily. 30 tablet 1  . oxyCODONE (OXY IR/ROXICODONE) 5 MG immediate release tablet Take 1 tablet (5 mg total) by mouth every 6 (six) hours as needed for severe pain. 30 tablet 0  . potassium chloride SA (K-DUR,KLOR-CON) 10 MEQ tablet Take 1 tablet (10 mEq total) by mouth daily. 30 tablet 1  . tamsulosin (FLOMAX) 0.4 MG CAPS capsule Take 1 capsule (0.4 mg total) by mouth daily. 14 capsule 0  .  Vitamin D, Cholecalciferol, 400 units CAPS Take 400 Units by mouth daily.      No facility-administered medications prior to visit.    EXAM: BP 131/74 (BP Location: Left Arm, Patient Position: Sitting, Cuff Size: Normal)   Pulse 61   Temp 97.7 F (36.5 C) (Oral)   Resp 16   Ht 5' 9.5" (1.765 m)   Wt 163 lb 12 oz (74.3 kg)   SpO2 100%   BMI 23.83 kg/m  Gen: Alert, well appearing.  Patient is oriented to person, place, time, and situation. AFFECT: pleasant, lucid thought and speech. YBO:FBPZ: no injection, icteris, swelling, or exudate.  EOMI, PERRLA. Mouth: lips without lesion/swelling.  Oral mucosa pink and moist. Oropharynx without erythema, exudate, or swelling.  CV: RRR, no m/r/g.  Midline sternotomy scar w/out erythema, exudate, tenderness, or sign of dehiscence. LUNGS: CTA bilat, nonlabored resps, good aeration in all lung fields. ABD: soft, NT/ND.  Pertinent labs/imaging Lab Results  Component Value Date   TSH 1.181 05/16/2016   Lab Results  Component Value Date   WBC 8.4 05/15/2016   HGB 11.0 (L) 05/15/2016   HCT 32.6 (L) 05/15/2016   MCV 91.8 05/15/2016   PLT 118 (L) 05/15/2016   Lab Results  Component Value Date   CREATININE 0.77 05/17/2016   BUN 22 (H) 05/17/2016   NA 136 05/17/2016   K 3.7 05/17/2016   CL 104 05/17/2016   CO2 22 05/17/2016   Lab Results  Component Value Date   ALT 16 03/30/2016   AST 25 03/30/2016   ALKPHOS 68 03/30/2016   BILITOT 0.8 03/30/2016   Lab Results  Component Value Date   CHOL 163 03/30/2016   Lab Results  Component Value Date   HDL 34.50 (L) 03/30/2016   Lab Results  Component Value Date   LDLCALC 112 (H) 03/30/2016   Lab Results  Component Value Date   TRIG 83.0 03/30/2016   Lab Results  Component Value Date   CHOLHDL 5 03/30/2016   Lab Results  Component Value Date   PSA <0.02 10/15/2015   PSA 17.82 (H) 12/22/2014   PSA 13.92 (H) 11/14/2014   ASSESSMENT/PLAN:  1) NSTEMI/3 vessel CAD--now s/p 3  vessel CABG--doing very well at this time Continue high dose statin, beta blocker, ASA, ACE-I at current doses.  Keep cardiology f/u in about 10d, and CV surgery f/u in about 1 month.  2) Ischemic cardiomyopathy: no sign of volume overload.  Continue lasix, beta blocker, ACE-I.  3) Post-op a-fib: staying in sinus rhythm on amiodarone.  No anticoagulant at this time. Suspect cardiology or CV surgery will look at keeping him on this only short term as long as he maintains sinus rhythm.  4) Thrombocytopenia; primarily in hosp recently.  Recheck CBC today.  5) Mild post-op blood loss anemia: recheck CBC today.  An After Visit Summary was printed and given to the patient.  Medical decision making of moderate complexity was utilized today.  An After Visit Summary was printed and given to the patient.  FOLLOW UP:  Keep appt already set with me for 09/27/16.  Signed:  Crissie Sickles, MD           05/20/2016

## 2016-05-29 NOTE — Progress Notes (Signed)
Cardiology Office Note    Date:  05/30/2016   ID:  Daniel Sabala., DOB Nov 28, 1945, MRN [id number]  PCP:  Daniel Sou, MD  Cardiologist: Dr. Claiborne Billings   Chief Complaint  Patient presents with  . Hospitalization Follow-up    s/p CABG    History of Present Illness:    Daniel [name]. is a 71 y.o. male with past medical history of HTN, HLD, COPD, and prostate cancer (s/p surgery) who presents to the office today for hospital follow-up.   He was recently admitted from 4/9 - 05/17/2016 for an NSTEMI with peak troponin values > 65. Cardiac catheterization showed severe multivessel CAD with 30% near ostial stenosis, 75% proximal, and total occlusion of a large LAD with distal collateralization, 50% proximal left circumflex stenoses with total occlusion of the distal circumflex; and chronic total occlusion of the mid RCA. EF was reduced at 20-25%. CT Surgery consult was recommended and he went for CABG on 05/12/2016 by Dr. Roxan Hockey with LIMA-D1, SVG-LAD, and SVG-PDA. No immediate surgical complications were noted. He went into atrial fibrillation on post-op Day #4 and was started on IV Amiodarone and converted to NSR prior to discharge. He was placed on ASA 325mg  daily, Amiodarone (400mg  BID for 6 days then 200mg  BID), Atorvastatin 80mg  daily, Benazepril 20mg  daily, Coreg 3.125mg  BID, and Lasix 20mg  daily.   In talking with the patient today, he reports overall doing well since his recent hospitalization and surgery. He does report an occasional mild episode of sternal chest discomfort but says this is relieved with Tylenol and he has not had to take any Rx pain medication. He denies any exertional chest discomfort or dyspnea on exertion and [place]   Several days after going home from the hospital, he did note worsening dyspnea at nighttime and associated this with his Amiodarone. He self discontinued this last Thursday and reported his symptoms  significantly improved. He wishes to remain off of the medication due to its side-effect profile.   He reports good compliance with his other medications including ASA 325mg  daily, Lipitor 80mg  daily, Benazepril 20mg  daily, Coreg 3.125mg  BID, and Lasix 20mg  daily. Has a good appetite but says he only consumes 2-3 glasses of water per day.   Past Medical History:  Diagnosis Date  . Anxiety   . Atrial fibrillation ([place])    Limited episode, emergency room, June, 2013, spontaneous conversion to sinus rhythm, was evaluated By [name] 2013- no further visits.  [name] BPH (benign prostatic hypertrophy) with urinary obstruction    Nocturia is his primary symptom.  Acute urinary retention occurred when he got CT with contrast fall 2016 ([name]).  [name] CAD (coronary artery disease) 05/2016   a. 05/2016: NSTEMI with cath showing 3-vessel disease. Underwent CABG on 05/12/16 with LIMA-D1, SVG-LAD, and SVG-PDA.   [name] COPD (chronic obstructive pulmonary disease) ([place]) fall 2016   Bullous changes noted on lower lung images of CT abd done by urology  . Diverticulitis   . GERD (gastroesophageal reflux disease)   . Has received pneumococcal vaccination   . Hyperlipidemia    statin intolerant  . Hypertension   . Microscopic hematuria Fall 2016   CT nl except nonobstructing stones.  Cystoscopy normal 12/30/14 ([name])  . Nephrolithiasis    11 mm stone on R, 2 mm stone on L, ureters clear  . Non-STEMI (non-ST elevated myocardial infarction) ([place]) 05/2016   with impaired  LV function  . 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  Urol ([name]) at [facility]; [name] referred him to [name] 03/2015: patient got bilat nerve sparing , robot assisted laparoscopic radical prostatectomy and pelvic lymphadenectomy.  As of 10/21/15 urol f/u, PSA undetectable (repeat 04/22/16 result pending as per urol f/u that day), pt chose close PSA surveillance over adjuvant radiation  tx.  . Tobacco dependence    down to 1-2 cigs per day as of 11/2015  . Urinary retention 05/2015   occurred s/p foley removal    Past Surgical History:  Procedure Laterality Date  . aortic ultrasound  07/2012   NO AAA  . COLONOSCOPY  approx 2011 per pt   Done by surgeon in [place] after a bout of diverticulitis; normal per pt report--was told to repeat in 10 yrs.  . CORONARY ARTERY BYPASS GRAFT N/A 05/12/2016   Procedure: CORONARY ARTERY BYPASS GRAFTING (CABG) TIMES THREE USING LEFT INTERNAL MAMMARY ARTERY AND RIGHT SAPHENOUS LEG VEIN HARVESTED ENDOSCOPICALLY;  Surgeon: [name], MD;  Location: [facility];  Service: Open Heart Surgery;  Laterality: N/A;  . INGUINAL HERNIA REPAIR  2003 and 2004   Both sides.  [name] LEFT HEART CATH AND CORONARY ANGIOGRAPHY N/A 05/09/2016   Procedure: Left Heart Cath and Coronary Angiography;  Surgeon: [name], MD;  Location: [facility];  Service: Cardiovascular;  Laterality: N/A;  . LITHOTRIPSY  2004   Dr. Maryland Pink in [place].  [name] LYMPHADENECTOMY Bilateral 04/30/2015   Procedure: PELVIC LYMPHADENECTOMY;  Surgeon: [name], MD;  Location: [facility];  Service: Urology;  Laterality: Bilateral;  . PROSTATE BIOPSY  02/05/15   Prostate adenocarcinoma: pt considering treatment options as of 02/19/15  . ROBOT ASSISTED LAPAROSCOPIC RADICAL PROSTATECTOMY N/A 04/30/2015   Procedure: XI ROBOTIC ASSISTED LAPAROSCOPIC RADICAL PROSTATECTOMY LEVEL 2;  Surgeon: [name], MD;  Location: [facility];  Service: Urology;  Laterality: N/A;  . TEE WITHOUT CARDIOVERSION N/A 05/12/2016   Procedure: TRANSESOPHAGEAL ECHOCARDIOGRAM (TEE);  Surgeon: [name], MD;  Location: [facility];  Service: Open Heart Surgery;  Laterality: N/A;  . TRANSTHORACIC ECHOCARDIOGRAM  03/22/2006; 05/2016   2008: EF 65%, normal valves, no wall motion abnormalties, LA size normal.  05/2016 in context of non-STEMI, EF 20-25%, mild AV and MV regurg.    Current Medications: Outpatient Medications  Prior to Visit  Medication Sig Dispense Refill  . ALPRAZolam (XANAX) 0.25 MG tablet TAKE ONE TABLET BY MOUTH AT BEDTIME AS NEEDED (Patient taking differently: TAKE 0.125 MG BY MOUTH DAILY AS NEEDED FOR ANXIETY) 30 tablet 3  . amiodarone (PACERONE) 200 MG tablet Please take 2 tabs (400mg ) twice a day for 6 days then take 1 tab (200mg ) twice a day until we see you in the office. 70 tablet 0  . aspirin EC 325 MG EC tablet Take 1 tablet (325 mg total) by mouth daily. 30 tablet 0  . atorvastatin (LIPITOR) 80 MG tablet Take 1 tablet (80 mg total) by mouth daily at 6 PM. 30 tablet 1  . benazepril (LOTENSIN) 20 MG tablet Take 1 tablet (20 mg total) by mouth daily. 30 tablet 1  . carvedilol (COREG) 3.125 MG tablet Take 1 tablet (3.125 mg total) by mouth 2 (two) times daily with a meal. 30 tablet 1  . oxyCODONE (OXY IR/ROXICODONE) 5 MG immediate release tablet Take 1 tablet (5 mg total) by mouth every 6 (six) hours as needed for severe  pain. 30 tablet 0  . tamsulosin (FLOMAX) 0.4 MG CAPS capsule Take 1 capsule (0.4 mg total) by mouth daily. 14 capsule 0  . Vitamin D, Cholecalciferol, 400 units CAPS Take 400 Units by mouth daily.     . furosemide (LASIX) 20 MG tablet Take 1 tablet (20 mg total) by mouth daily. 30 tablet 1  . potassium chloride SA (K-DUR,KLOR-CON) 10 MEQ tablet Take 1 tablet (10 mEq total) by mouth daily. 30 tablet 1   No facility-administered medications prior to visit.      Allergies:   Amlodipine; Contrast media [iodinated diagnostic agents]; and Hydralazine   Social History   Social History  . Marital status: Married    Spouse name: N/A  . Number of children: N/A  . Years of education: N/A   Social History Main Topics  . Smoking status: Current Every Day Smoker    Packs/day: 1.00    Years: 55.00    Types: Cigarettes  . Smokeless tobacco: Never Used  . Alcohol use No  . Drug use: No  . Sexual activity: Not on file   Other Topics Concern  . Not on file   Social History  Narrative   Married, 2 grown children.   HS education.  Orig from [place], lives there now.   Occupation: maintenance.  Drives a tractor a lot, drives a pickup truck a lot, rides horses a lot.   Tobacco: 20 pack-yr hx (current as of 07/2012)   No drugs.   Alcohol: none     Family History:  [name] family history includes Cancer in his father; Cancer - Other (age of onset: 51) in his mother; Diabetes in his sister; Hypertension in his mother.   Review of Systems:   Please see the history of present illness.     General:  No chills, fever, night sweats or weight changes.  Cardiovascular:  No dyspnea on exertion, edema, orthopnea, palpitations, paroxysmal nocturnal dyspnea. Positive for sternal chest pain.  Dermatological: No rash, lesions/masses Respiratory: No cough, dyspnea Urologic: No hematuria, dysuria Abdominal:   No nausea, vomiting, diarrhea, bright red blood per rectum, melena, or hematemesis Neurologic:  No visual changes, wkns, changes in mental status. All other systems reviewed and are otherwise negative except as noted above.   Physical Exam:    VS:  BP (!) 142/76   Pulse (!) 50   Ht 5' 9.5" (1.765 m)   Wt 160 lb (72.6 kg)   BMI 23.29 kg/m    General: Well developed, well nourished Caucasian male appearing in no acute distress. Head: Normocephalic, atraumatic, sclera non-icteric, no xanthomas, nares are without discharge.  Neck: No carotid bruits. JVD not elevated.  Lungs: Respirations regular and unlabored, without wheezes or rales.  Heart: Regular rate and rhythm. No S3 or S4.  No murmur, no rubs, or gallops appreciated. Sternal incision appears well-healing with no erythema or drainage noted.  Abdomen: Soft, non-tender, non-distended with normoactive bowel sounds. No hepatomegaly. No rebound/guarding. No obvious abdominal masses. Msk:  Strength and tone appear normal for age. No joint deformities or effusions. Extremities: No clubbing or cyanosis. No  lower extremity edema.  Distal pedal pulses are 2+ bilaterally. Vein graft harvest sites without erythema or drainage.  Neuro: Alert and oriented X 3. Moves all extremities spontaneously. No focal deficits noted. Psych:  Responds to questions appropriately with a normal affect. Skin: No rashes or lesions noted  Wt Readings from Last 3 Encounters:  05/30/16 160 lb (72.6 kg)  05/20/16 163  lb 12 oz (74.3 kg)  05/17/16 159 lb 2.8 oz (72.2 kg)     Studies/Labs Reviewed:   EKG:  EKG is ordered today.  The ekg ordered today demonstrates sinus bradycardia, HR 50, with lateral t-wave abnormalities, similar to prior tracings.   Recent Labs: 03/30/2016: ALT 16 05/16/2016: Magnesium 1.7; TSH 1.181 05/17/2016: BUN 22; Creatinine, Ser 0.77; Potassium 3.7; Sodium 136 05/20/2016: Hemoglobin 12.0; Platelets 311.0   Lipid Panel    Component Value Date/Time   CHOL 163 03/30/2016 0851   TRIG 83.0 03/30/2016 0851   HDL 34.50 (L) 03/30/2016 0851   CHOLHDL 5 03/30/2016 0851   VLDL 16.6 03/30/2016 0851   LDLCALC 112 (H) 03/30/2016 0851    Additional studies/ records that were reviewed today include:   Cardiac Catheterization: 05/09/2016  Mid RCA lesion, 100 %stenosed.  Dist Cx lesion, 100 %stenosed.  Ost Cx to Prox Cx lesion, 50 %stenosed.  LM lesion, 30 %stenosed.  Prox LAD to Mid LAD lesion, 100 %stenosed.  The left ventricular ejection fraction is less than 25% by visual estimate.  LV end diastolic pressure is normal.  There is severe left ventricular systolic dysfunction.   Severe LV dysfunction with an ejection fraction of 20-25%. There was akinesis of the distal inferoapical segment with significant hypokinesis globally c/w an ischemic cardiomyopathy.  Severe multivessel CAD with 30% near ostial stenosis, 75% proximal, and total occlusion of a large LAD with distal collateralization; 50% proximal left circumflex stenoses with total occlusion of the distal circumflex; and chronic total  occlusion of the mid RCA with collateralization distally via the RV marginal branch as well as via the left coronary injection.  RECOMMENDATION: Surgical consultation for CABG revascularization.    Echocardiogram: 05/10/2016 Study Conclusions  - Left ventricle: The cavity size was normal. Systolic function was   severely reduced. The estimated ejection fraction was in the   range of 25% to 30%. There is akinesis of the mid-apicalanterior,   inferolateral, and apical myocardium. Doppler parameters are   consistent with abnormal left ventricular relaxation (grade 1   diastolic dysfunction). - Aortic valve: There was mild regurgitation. - Aortic root: The aortic root was mildly dilated.  Impressions:  - Technically difficult; definity used; akinesis of the anterior,   mid/distal inferolateral and apical walls with overall severely   reduced LV systolic function; slow flow noted at LV apex   (suggesting increased risk of thrombus formation); grade 1   diastolic dysfunction; mild AI.   Assessment:    1. Non-ST elevation myocardial infarction (NSTEMI), subsequent episode of care ([place])   2. Coronary artery disease involving native coronary artery of native heart without angina pectoris   3. Ischemic cardiomyopathy   4. Essential hypertension   5. Hyperlipidemia LDL goal <70   6. Postoperative atrial fibrillation ([place])      Plan:   In order of problems listed above:  1. Subsequent Episode of Care for NSTEMI/ CAD - recently admitted from 4/9 - 05/17/2016 for an NSTEMI with peak troponin values > 65 and cath showing 3-vessel disease. Underwent CABG on 05/12/2016 with LIMA-D1, SVG-LAD, and SVG-PDA.  - he denies any recurrent chest pain or dyspnea on exertion. He is walking over one mile per day. Sternal incision appears well-healing. - Continue ASA 325mg  daily, Atorvastatin 80mg  daily, Benazepril 20mg  daily, and Coreg 3.125mg  BID. Will reduce Lasix to 20mg  on MWF instead of daily  as he does not appear volume overloaded. With his presenting event being an NSTEMI, he should be  on DAPT. Will defer to Dr. Roxan Hockey and Dr. Claiborne Billings for initiation of this.   2. Ischemic Cardiomyopathy - EF at 20-25% by cath during recent admission, 25-30% by echo. - he does not appear volume overloaded on physical examination and reports not consuming many fluids at home. Will reduce Lasix dosing from 20mg  daily to 20mg  MWF. - continue BB and ACE-I. Would not further titrate BB therapy at this time with HR in the 50's. Plan for repeat echocardiogram in 3 months to reassess EF following revascularization.   3. HTN - BP elevated to 160/80 on initial check, improved to 142/76 on recheck. - continue Lotensin 20mg  daily and Coreg 3.125mg  BID.   4. HLD - Lipid Panel in 03/2016 showed total cholesterol 162, HDL 34, and LDL 112. - started on Atorvastatin 80mg  daily. Continue with goal LDL < 70. He is listed as being statin intolerant in the past. If he develops symptoms, could reduce dosing to 40mg  daily.   5. Post-op Atrial Fibrillation - went into atrial fibrillation on post-op Day #4 and was started on IV Amiodarone and converted to NSR prior to discharge. Suppose to be on PO Amiodarone for 6 weeks but he self-discontinued this due to dyspnea and the listed side-effects.  - he is maintaining NSR on examination today. Continue BB. He is reluctant to being on Amiodarone and with him maintaining NSR, will just continue BB therapy at this time. We discussed that if he had recurrent episodes of atrial fibrillation in the future further out from surgery, then we would need to consider initiation of anticoagulation.    Medication Adjustments/Labs and Tests Ordered: Current medicines are reviewed at length with the patient today.  Concerns regarding medicines are outlined above.  Medication changes, Labs and Tests ordered today are listed in the Patient Instructions below. Patient Instructions    Medication Instructions:  Take Furosemide and Potassium Monday's, Wednesday's and Friday's  Take an extra tablets of Coreg as needed for Palpitation  Labwork: None Ordered  Testing/Procedures: Your physician has requested that you have an echocardiogram in 3 Months. Echocardiography is a painless test that uses sound waves to create images of your heart. It provides your doctor with information about the size and shape of your heart and how well your heart's chambers and valves are working. This procedure takes approximately one hour. There are no restrictions for this procedure.  Follow-Up: Your physician recommends that you schedule a follow-up appointment in: 3 Months with Dr Claiborne Billings  Any Other Special Instructions Will Be Listed Below (If Applicable).  If you need a refill on your cardiac medications before your next appointment, please call your pharmacy.  Signed, Erma Heritage, PA-C  05/30/2016 1:34 PM    [facility] [place], [place], [place]  [address] Phone: [phone]; Fax: [phone]  [address] [place], [place] [address] Phone: [phone]

## 2016-05-30 ENCOUNTER — Encounter: Payer: Self-pay | Admitting: Student

## 2016-05-30 ENCOUNTER — Ambulatory Visit (INDEPENDENT_AMBULATORY_CARE_PROVIDER_SITE_OTHER): Payer: Medicare Other | Admitting: Student

## 2016-05-30 VITALS — BP 142/76 | HR 50 | Ht 69.5 in | Wt 160.0 lb

## 2016-05-30 DIAGNOSIS — E785 Hyperlipidemia, unspecified: Secondary | ICD-10-CM | POA: Diagnosis not present

## 2016-05-30 DIAGNOSIS — I214 Non-ST elevation (NSTEMI) myocardial infarction: Secondary | ICD-10-CM

## 2016-05-30 DIAGNOSIS — I9789 Other postprocedural complications and disorders of the circulatory system, not elsewhere classified: Secondary | ICD-10-CM | POA: Diagnosis not present

## 2016-05-30 DIAGNOSIS — I10 Essential (primary) hypertension: Secondary | ICD-10-CM | POA: Diagnosis not present

## 2016-05-30 DIAGNOSIS — I2510 Atherosclerotic heart disease of native coronary artery without angina pectoris: Secondary | ICD-10-CM

## 2016-05-30 DIAGNOSIS — I255 Ischemic cardiomyopathy: Secondary | ICD-10-CM

## 2016-05-30 DIAGNOSIS — I4891 Unspecified atrial fibrillation: Secondary | ICD-10-CM

## 2016-05-30 NOTE — Patient Instructions (Signed)
Medication Instructions:  Take Furosemide and Potassium Monday's, Wednesday's and Friday's  Take an extra tablets of Coreg as needed for Palpitation  Labwork: None Ordered  Testing/Procedures: Your physician has requested that you have an echocardiogram in 3 Months. Echocardiography is a painless test that uses sound waves to create images of your heart. It provides your doctor with information about the size and shape of your heart and how well your heart's chambers and valves are working. This procedure takes approximately one hour. There are no restrictions for this procedure.  Follow-Up: Your physician recommends that you schedule a follow-up appointment in: 3 Months with Dr Claiborne Billings   Any Other Special Instructions Will Be Listed Below (If Applicable).   If you need a refill on your cardiac medications before your next appointment, please call your pharmacy.

## 2016-05-31 ENCOUNTER — Telehealth (HOSPITAL_COMMUNITY): Payer: Self-pay | Admitting: Family Medicine

## 2016-05-31 ENCOUNTER — Encounter: Payer: Self-pay | Admitting: Family Medicine

## 2016-06-07 ENCOUNTER — Other Ambulatory Visit: Payer: Self-pay | Admitting: Thoracic Surgery (Cardiothoracic Vascular Surgery)

## 2016-06-07 DIAGNOSIS — Z951 Presence of aortocoronary bypass graft: Secondary | ICD-10-CM

## 2016-06-14 ENCOUNTER — Encounter: Payer: Self-pay | Admitting: Thoracic Surgery (Cardiothoracic Vascular Surgery)

## 2016-06-14 ENCOUNTER — Ambulatory Visit
Admission: RE | Admit: 2016-06-14 | Discharge: 2016-06-14 | Disposition: A | Discharge status: Home / Self Care | Payer: Medicare Other | Source: Ambulatory Visit | Attending: Thoracic Surgery (Cardiothoracic Vascular Surgery) | Admitting: Thoracic Surgery (Cardiothoracic Vascular Surgery)

## 2016-06-14 ENCOUNTER — Ambulatory Visit (INDEPENDENT_AMBULATORY_CARE_PROVIDER_SITE_OTHER): Payer: Self-pay | Admitting: Thoracic Surgery (Cardiothoracic Vascular Surgery)

## 2016-06-14 VITALS — BP 157/93 | HR 52 | Resp 16 | Ht 69.5 in | Wt 164.6 lb

## 2016-06-14 DIAGNOSIS — Z951 Presence of aortocoronary bypass graft: Secondary | ICD-10-CM

## 2016-06-14 DIAGNOSIS — I2510 Atherosclerotic heart disease of native coronary artery without angina pectoris: Secondary | ICD-10-CM

## 2016-06-14 DIAGNOSIS — J9811 Atelectasis: Secondary | ICD-10-CM | POA: Diagnosis not present

## 2016-06-14 NOTE — Progress Notes (Signed)
[address] SpringsSuite [address]       [place] [address]             [phone]     HPI: Mr. Majano returns for scheduled postoperative visit  Mr. Fontaine is a 71 year old man with a past medical history significant for tobacco abuse and COPD, hypertension, hyperlipidemia, prostate cancer, and reflux. He presented with the prolonged episode of chest pain and ruled in for a MI with a troponin greater than 65. A catheterization he had severe three-vessel coronary disease and severe left to go dysfunction with ejection fraction of 20%. He underwent coronary bypass grafting 3 on 05/12/2016. There were no graftable targets in the circumflex distribution.    During the postoperative course he had some atrial fibrillation which converted to sinus rhythm with amiodarone. He now is off the amiodarone.  He is not taking any narcotics. He has minimal discomfort. He has not had any recurrent angina. His exercise tolerance is good and he is anxious to increase his activities.  Past Medical History:  Diagnosis Date  . Anxiety   . Atrial fibrillation ([place])    Limited episode, emergency room, June, 2013, spontaneous conversion to sinus rhythm, was evaluated By [name] 2013- no further visits.  Post-op (CABG) a-fib, converted on amio but pt self d/c'd this med due to DOE/side effect profile.  [name] BPH (benign prostatic hypertrophy) with urinary obstruction    Nocturia is his primary symptom.  Acute urinary retention occurred when he got CT with contrast fall 2016 ([name]).  [name] CAD (coronary artery disease) 05/2016   a. 05/2016: NSTEMI with cath showing 3-vessel disease. Underwent CABG on 05/12/16 with LIMA-D1, SVG-LAD, and SVG-PDA.   [name] COPD (chronic obstructive pulmonary disease) ([place]) fall 2016   Bullous changes noted on lower lung images of CT abd done by urology  . Diverticulitis   . GERD (gastroesophageal reflux disease)   . Has received pneumococcal vaccination   . Hyperlipidemia    statin  intolerant  . Hypertension   . Ischemic cardiomyopathy 05/2016   EF 20-25% at the time of NSTEMI/CABG--plan for repeat echo approx 08/2016 for reassessment.  . Microscopic hematuria Fall 2016   CT nl except nonobstructing stones.  Cystoscopy normal 12/30/14 ([name])  . Nephrolithiasis    11 mm stone on R, 2 mm stone on L, ureters clear  . Non-STEMI (non-ST elevated myocardial infarction) ([place]) 05/2016   with impaired LV function  . 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  Urol ([name]) at [facility]; [name] referred him to [name] 03/2015: patient got bilat nerve sparing , robot assisted laparoscopic radical prostatectomy and pelvic lymphadenectomy.  As of 10/21/15 urol f/u, PSA undetectable (repeat 04/22/16 result pending as per urol f/u that day), pt chose close PSA surveillance over adjuvant radiation tx.  . Tobacco dependence    down to 1-2 cigs per day as of 11/2015  . Urinary retention 05/2015   occurred s/p foley removal     Current Outpatient Prescriptions  Medication Sig Dispense Refill  . ALPRAZolam (XANAX) 0.25 MG tablet TAKE ONE TABLET BY MOUTH AT BEDTIME AS NEEDED (Patient taking differently: TAKE 0.125 MG BY MOUTH DAILY AS NEEDED FOR ANXIETY) 30 tablet 3  . aspirin EC 325 MG EC tablet Take 1 tablet (325 mg total) by mouth daily. 30 tablet 0  . atorvastatin (LIPITOR) 80 MG tablet Take 1 tablet (80 mg  total) by mouth daily at 6 PM. 30 tablet 1  . benazepril (LOTENSIN) 20 MG tablet Take 1 tablet (20 mg total) by mouth daily. 30 tablet 1  . carvedilol (COREG) 3.125 MG tablet Take 1 tablet (3.125 mg total) by mouth 2 (two) times daily with a meal. 30 tablet 1  . furosemide (LASIX) 20 MG tablet Take 20 mg by mouth. Monday's. Wednesday's and Friday    . potassium chloride (K-DUR,KLOR-CON) 10 MEQ tablet Take 10 mEq by mouth. Monday's, Wednesday's and Friday's    . tamsulosin (FLOMAX) 0.4 MG CAPS capsule Take 1 capsule (0.4 mg  total) by mouth daily. 14 capsule 0  . Vitamin D, Cholecalciferol, 400 units CAPS Take 400 Units by mouth daily.      No current facility-administered medications for this visit.     Physical Exam BP (!) 157/93 (BP Location: Right Arm, Patient Position: Sitting, Cuff Size: Normal)   Pulse (!) 52   Resp 16   Ht 5' 9.5" (1.765 m)   Wt 164 lb 9.6 oz (74.7 kg)   SpO2 98% Comment: ON RA  BMI 23.37 kg/m  71 year old man in no acute distress Alert and oriented 3, hard of hearing, No motor deficits Cardiac regular rate and rhythm Lungs clear with equal breath sounds bilaterally Sternum stable, incision healing well Leg incisions healing well, trace edema lower extremities  Diagnostic Tests: CHEST  2 VIEW  COMPARISON:  05/15/2016  FINDINGS: The previously demonstrated pleural effusions of both completely resolved. Heart size and pulmonary vascularity are normal and the lungs are clear except for minimal atelectasis at the left base laterally. No pneumothorax. Slight bilateral apical pleural thickening. CABG.  IMPRESSION: Complete resolution of pleural effusions. Minimal atelectasis at the left base laterally.   Electronically Signed   By: Lorriane Shire M.D.   On: 06/14/2016 09:47 I personally reviewed the chest x-ray concurrent with findings noted above  Impression: Mr. Szymborski is a 71 year old gentleman who presented with an out of hospital acute MI about a month ago. He had severe three-vessel disease catheterization and underwent coronary bypass grafting 3 on 05/12/2016. His surgery went well as did his postoperative recovery. He did not have any graftable targets in the circumflex distribution. His remaining targets were good quality at the site of anastomosis but were diffusely diseased.  He had postoperative atrial fibrillation which converted to sinus rhythm with amiodarone. He now is off amiodarone and has a regular rhythm today. He remains on  carvedilol.  Hypertension- blood pressure elevated today. He tells me he has white could hypertension. His blood pressure cuff at home that he uses on a regular basis and his systolics are generally in the 130 range. No medication changes were made today.  Tobacco abuse- has not smoked since surgery. Smoking cessation instruction/counseling given:  commended patient for quitting and reviewed strategies for preventing relapses   Instructions were activities were given. He may drive a limited basis. He should not lift anything over 10 pounds for another 2 weeks.  Plan: Follow-up with cardiology.  I will be happy to see him back any time if I can be of any further assistance with his care.  [name], MD [facility] [phone]

## 2016-06-16 ENCOUNTER — Other Ambulatory Visit: Payer: Self-pay | Admitting: Family Medicine

## 2016-06-16 NOTE — Telephone Encounter (Signed)
Daniel [name]  RF request for carvedilol LOV: 05/20/16 Next ov: 09/27/16 Last written: 05/17/16 #30 1RF  New med, started in hospital. Do you want to manage care for this medication?

## 2016-06-23 ENCOUNTER — Telehealth (HOSPITAL_COMMUNITY): Payer: Self-pay

## 2016-06-30 ENCOUNTER — Encounter (HOSPITAL_COMMUNITY): Payer: Self-pay | Admitting: Family Medicine

## 2016-06-30 NOTE — Progress Notes (Signed)
Mailed letter with our CRP for pt to return call if interested .... KJ

## 2016-07-18 ENCOUNTER — Other Ambulatory Visit: Payer: Self-pay | Admitting: Family Medicine

## 2016-07-27 ENCOUNTER — Other Ambulatory Visit: Payer: Self-pay | Admitting: Family Medicine

## 2016-07-28 NOTE — Telephone Encounter (Signed)
Rx faxed to pharmacy  

## 2016-08-05 ENCOUNTER — Other Ambulatory Visit: Payer: Self-pay

## 2016-08-05 ENCOUNTER — Ambulatory Visit (HOSPITAL_COMMUNITY): Payer: Medicare Other | Attending: Cardiovascular Disease

## 2016-08-05 DIAGNOSIS — I10 Essential (primary) hypertension: Secondary | ICD-10-CM | POA: Diagnosis not present

## 2016-08-05 DIAGNOSIS — J449 Chronic obstructive pulmonary disease, unspecified: Secondary | ICD-10-CM | POA: Insufficient documentation

## 2016-08-05 DIAGNOSIS — E785 Hyperlipidemia, unspecified: Secondary | ICD-10-CM | POA: Insufficient documentation

## 2016-08-05 DIAGNOSIS — Z951 Presence of aortocoronary bypass graft: Secondary | ICD-10-CM | POA: Diagnosis not present

## 2016-08-05 DIAGNOSIS — I2510 Atherosclerotic heart disease of native coronary artery without angina pectoris: Secondary | ICD-10-CM | POA: Diagnosis not present

## 2016-08-05 DIAGNOSIS — I255 Ischemic cardiomyopathy: Secondary | ICD-10-CM | POA: Insufficient documentation

## 2016-08-05 DIAGNOSIS — I214 Non-ST elevation (NSTEMI) myocardial infarction: Secondary | ICD-10-CM | POA: Diagnosis not present

## 2016-08-05 DIAGNOSIS — I4891 Unspecified atrial fibrillation: Secondary | ICD-10-CM | POA: Diagnosis not present

## 2016-08-05 DIAGNOSIS — I351 Nonrheumatic aortic (valve) insufficiency: Secondary | ICD-10-CM | POA: Diagnosis not present

## 2016-08-11 ENCOUNTER — Other Ambulatory Visit: Payer: Self-pay | Admitting: Family Medicine

## 2016-08-11 NOTE — Telephone Encounter (Signed)
[place].  RF request for atorvastatin LOV: 05/20/16 Next ov: 09/27/16 Last written: 05/17/16 #30 w/ 1RF

## 2016-08-18 ENCOUNTER — Telehealth: Payer: Self-pay | Admitting: Cardiovascular Disease

## 2016-08-18 ENCOUNTER — Encounter: Payer: Self-pay | Admitting: Family Medicine

## 2016-08-18 ENCOUNTER — Ambulatory Visit (INDEPENDENT_AMBULATORY_CARE_PROVIDER_SITE_OTHER): Payer: Medicare Other

## 2016-08-18 ENCOUNTER — Other Ambulatory Visit: Payer: Self-pay

## 2016-08-18 ENCOUNTER — Ambulatory Visit (INDEPENDENT_AMBULATORY_CARE_PROVIDER_SITE_OTHER): Payer: Medicare Other | Admitting: Family Medicine

## 2016-08-18 VITALS — BP 157/81 | HR 38 | Temp 97.7°F | Resp 16 | Ht 69.5 in | Wt 167.0 lb

## 2016-08-18 DIAGNOSIS — I10 Essential (primary) hypertension: Secondary | ICD-10-CM | POA: Diagnosis not present

## 2016-08-18 DIAGNOSIS — I4891 Unspecified atrial fibrillation: Secondary | ICD-10-CM | POA: Diagnosis not present

## 2016-08-18 DIAGNOSIS — I493 Ventricular premature depolarization: Secondary | ICD-10-CM | POA: Diagnosis not present

## 2016-08-18 NOTE — Progress Notes (Signed)
OFFICE VISIT  08/18/2016   CC:  Chief Complaint  Patient presents with  . Follow-up    HTN   HPI:    Patient is a 71 y.o. Caucasian male who presents for elevated bp measurements at home.  Denies palpitations, dizziness, CP, SOB. Yesterday he noted his bp was elevated at home (was applying bp cuff around elbow, though), noted heart skipping beats.   He admits that since his heart surgery a few months ago his bp's have been 150s avg over 70-80s. Increased stress at home with family problems.  Compliant with meds.  He is still abstaining from smoking--since surgery 05/2016.  Most recent cardiologist f/u he was in sinus rhythm (05/30/16).  Pt stopped amiodarone not long after his discharge from [facility] 05/2016 due to fatigue--this resolved off the amiodarone. He was also in sinus rhythm at CV surg f/u 06/14/16.  Past Medical History:  Diagnosis Date  . Anxiety   . Atrial fibrillation ([place])    Limited episode, emergency room, June, 2013, spontaneous conversion to sinus rhythm, was evaluated By [name] 2013- no further visits.  Post-op (CABG) a-fib, converted on amio but pt self d/c'd this med due to DOE/side effect profile.  [name] BPH (benign prostatic hypertrophy) with urinary obstruction    Nocturia is his primary symptom.  Acute urinary retention occurred when he got CT with contrast fall 2016 ([name]).  [name] CAD (coronary artery disease) 05/2016   a. 05/2016: NSTEMI with cath showing 3-vessel disease. Underwent CABG on 05/12/16 with LIMA-D1, SVG-LAD, and SVG-PDA.   [name] COPD (chronic obstructive pulmonary disease) ([place]) fall 2016   Bullous changes noted on lower lung images of CT abd done by urology  . Diverticulitis   . GERD (gastroesophageal reflux disease)   . Has received pneumococcal vaccination   . Hyperlipidemia    statin intolerant  . Hypertension   . Ischemic cardiomyopathy 05/2016   EF 20-25% at the time of NSTEMI/CABG--plan for repeat echo approx 08/2016 for reassessment.  .  Microscopic hematuria Fall 2016   CT nl except nonobstructing stones.  Cystoscopy normal 12/30/14 ([name])  . Nephrolithiasis    11 mm stone on R, 2 mm stone on L, ureters clear  . Non-STEMI (non-ST elevated myocardial infarction) ([place]) 05/2016   with impaired LV function  . 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  Urol ([name]) at [facility]; [name] referred him to [name] 03/2015: patient got bilat nerve sparing , robot assisted laparoscopic radical prostatectomy and pelvic lymphadenectomy.  As of 10/21/15 urol f/u, PSA undetectable (repeat 04/22/16 result pending as per urol f/u that day), pt chose close PSA surveillance over adjuvant radiation tx.  . Tobacco dependence    down to 1-2 cigs per day as of 11/2015  . Urinary retention 05/2015   occurred s/p foley removal    Past Surgical History:  Procedure Laterality Date  . aortic ultrasound  07/2012   NO AAA  . COLONOSCOPY  approx 2011 per pt   Done by surgeon in [place] after a bout of diverticulitis; normal per pt report--was told to repeat in 10 yrs.  . CORONARY ARTERY BYPASS GRAFT N/A 05/12/2016   Procedure: CORONARY ARTERY BYPASS GRAFTING (CABG) TIMES THREE USING LEFT INTERNAL MAMMARY ARTERY AND RIGHT SAPHENOUS LEG VEIN HARVESTED ENDOSCOPICALLY;  Surgeon: [name], MD;  Location: [facility];  Service: Open Heart Surgery;  Laterality: N/A;  . INGUINAL HERNIA REPAIR  2003  and 2004   Both sides.  [name] LEFT HEART CATH AND CORONARY ANGIOGRAPHY N/A 05/09/2016   Procedure: Left Heart Cath and Coronary Angiography;  Surgeon: [name], MD;  Location: [facility];  Service: Cardiovascular;  Laterality: N/A;  . LITHOTRIPSY  2004   Dr. [place] Pink in [place].  [name] LYMPHADENECTOMY Bilateral 04/30/2015   Procedure: PELVIC LYMPHADENECTOMY;  Surgeon: [name], MD;  Location: [facility];  Service: Urology;  Laterality: Bilateral;  . PROSTATE BIOPSY  02/05/15   Prostate adenocarcinoma:  pt considering treatment options as of 02/19/15  . ROBOT ASSISTED LAPAROSCOPIC RADICAL PROSTATECTOMY N/A 04/30/2015   Procedure: XI ROBOTIC ASSISTED LAPAROSCOPIC RADICAL PROSTATECTOMY LEVEL 2;  Surgeon: [name], MD;  Location: [facility];  Service: Urology;  Laterality: N/A;  . TEE WITHOUT CARDIOVERSION N/A 05/12/2016   Procedure: TRANSESOPHAGEAL ECHOCARDIOGRAM (TEE);  Surgeon: [name], MD;  Location: [facility];  Service: Open Heart Surgery;  Laterality: N/A;  . TRANSTHORACIC ECHOCARDIOGRAM  03/22/2006; 05/2016; 07/2016   2008: EF 65%, normal valves, no wall motion abnormalties, LA size normal.  05/2016 in context of non-STEMI, EF 20-25%, mild AV and MV regurg.  08/05/16: EF 30-35%, akinesis of mid lateral myoc, grd II DD, mild aortic regurg.    Outpatient Medications Prior to Visit  Medication Sig Dispense Refill  . ALPRAZolam (XANAX) 0.25 MG tablet TAKE ONE TABLET BY MOUTH AT BEDTIME AS NEEDED 30 tablet 5  . aspirin EC 325 MG EC tablet Take 1 tablet (325 mg total) by mouth daily. 30 tablet 0  . atorvastatin (LIPITOR) 80 MG tablet TAKE ONE TABLET BY MOUTH EVERY DAY AT 6PM 30 tablet 5  . benazepril (LOTENSIN) 20 MG tablet Take 1 tablet (20 mg total) by mouth daily. 30 tablet 1  . carvedilol (COREG) 3.125 MG tablet TAKE ONE TABLET BY MOUTH TWICE DAILY WITH A MEAL 60 tablet 6  . furosemide (LASIX) 20 MG tablet TAKE ONE TABLET BY MOUTH EVERY DAY 30 tablet 3  . potassium chloride (K-DUR,KLOR-CON) 10 MEQ tablet Take 10 mEq by mouth. Monday's, Wednesday's and Friday's    . Vitamin D, Cholecalciferol, 400 units CAPS Take 400 Units by mouth daily.     . tamsulosin (FLOMAX) 0.4 MG CAPS capsule Take 1 capsule (0.4 mg total) by mouth daily. (Patient not taking: Reported on 08/18/2016) 14 capsule 0   No facility-administered medications prior to visit.     Allergies  Allergen Reactions  . Amlodipine Other (See Comments)    LE swelling  . Contrast Media [Iodinated Diagnostic Agents] Other (See  Comments)    Prostate problem had to wear a catheter for two weeks after having dye.   [name] Hydralazine Palpitations and Other (See Comments)    Fatigue+    ROS As per HPI  PE: Blood pressure (!) 157/81, pulse (!) 38, temperature 97.7 F (36.5 C), temperature source Oral, resp. rate 16, height 5' 9.5" (1.765 m), weight 167 lb (75.8 kg), SpO2 99 %. Gen: Alert, well appearing.  Patient is oriented to person, place, time, and situation. AFFECT: pleasant, lucid thought and speech. CV: irregular irregular rhythm, rate 30s-40s, no signif m/r Chest is clear, no wheezing or rales. Normal symmetric air entry throughout both lung fields. No chest wall deformities or tenderness. EXT: no clubbing or cyanosis.  Trace bilat LE edema noted.  LABS:    Chemistry      Component Value Date/Time   NA 136 05/17/2016 0206   K 3.7 05/17/2016 0206   CL 104  05/17/2016 0206   CO2 22 05/17/2016 0206   BUN 22 (H) 05/17/2016 0206   CREATININE 0.77 05/17/2016 0206      Component Value Date/Time   CALCIUM 8.3 (L) 05/17/2016 0206   ALKPHOS 68 03/30/2016 0851   AST 25 03/30/2016 0851   ALT 16 03/30/2016 0851   BILITOT 0.8 03/30/2016 0851     Lab Results  Component Value Date   TSH 1.181 05/16/2016   IMPRESSION AND PLAN:  1) Post-op atrial fibrillation.  This converted on amiodarone but pt could not tolerate this med. He remained in sinus rhythm at post op cardiology and CV surgery follow ups, but today sounds like he is in a-fib with slow vent response. Unfortunately, our office EKG machine is not functioning today, so arrangement had to be made for him to go to [facility] on [address] to get an EKG this afternoon. This EKG showed sinus rhythm with PVCs.  Dr. Allison Quarry recommendation was to do no changes as long as the patient is asymptomatic.  If he should get symptoms of bradycardia then we could hold his coreg for a day or two.  Pt is instructed to continue daily BP and HR monitoring.  He has f/u with Dr.  Claiborne Billings on 09/09/16. Dr. Allison Quarry assistance with this patient today is much appreciated.  2) HTN; not ideal control.  Increase lotensin to 40 mg qd.  An After Visit Summary was printed and given to the patient.  FOLLOW UP:   Signed:  Crissie Sickles, MD           08/18/2016

## 2016-08-18 NOTE — Progress Notes (Signed)
Pt here for ECG referred by PCP pt states that he is asymptomatic and has occasional irregular HR.  S/w Dr Ellyn Hack he states that since pt is asymptomatic ok to skip carvedilol if he has sx. Monitor BP and HR until scheduled appt, call if this is happening frequently or abn BP/HR  Pt is agreeable and will call and let us know if anything comes back Abnormal and will discuss wth Dr Claiborne Billings at scheduled appt in august

## 2016-08-18 NOTE — Telephone Encounter (Signed)
Please calll  Dr Louis Meckel Lezama

## 2016-08-18 NOTE — Telephone Encounter (Signed)
S/w Dr Ellyn Hack he states that since pt is asymptomatic ok to skip occasional carvedilol only if he has sx. Monitor BP and HR until scheduled appt, call if this is happening frequently or abn BP/HR  Updated Dr Ernestine Conrad on Dr Allison Quarry orders above from nurse visit today-he states that this is agreeable with him.

## 2016-08-18 NOTE — Telephone Encounter (Signed)
The patient was found to have sinus bradycardia with rates in the 50s. Relatively asymptomatic. I agree with recommendations to hold carvedilol if he is symptomatic with bradycardia, but otherwise would leave alone until he sees his primary cardiologist...   Glenetta Hew, MD

## 2016-09-09 ENCOUNTER — Encounter: Payer: Self-pay | Admitting: Cardiovascular Disease

## 2016-09-09 ENCOUNTER — Ambulatory Visit (INDEPENDENT_AMBULATORY_CARE_PROVIDER_SITE_OTHER): Payer: Medicare Other | Admitting: Cardiovascular Disease

## 2016-09-09 VITALS — BP 162/86 | HR 56 | Ht 70.5 in | Wt 166.8 lb

## 2016-09-09 DIAGNOSIS — E785 Hyperlipidemia, unspecified: Secondary | ICD-10-CM

## 2016-09-09 DIAGNOSIS — I214 Non-ST elevation (NSTEMI) myocardial infarction: Secondary | ICD-10-CM | POA: Diagnosis not present

## 2016-09-09 DIAGNOSIS — I2510 Atherosclerotic heart disease of native coronary artery without angina pectoris: Secondary | ICD-10-CM | POA: Diagnosis not present

## 2016-09-09 DIAGNOSIS — J449 Chronic obstructive pulmonary disease, unspecified: Secondary | ICD-10-CM

## 2016-09-09 DIAGNOSIS — I493 Ventricular premature depolarization: Secondary | ICD-10-CM | POA: Diagnosis not present

## 2016-09-09 DIAGNOSIS — I10 Essential (primary) hypertension: Secondary | ICD-10-CM

## 2016-09-09 DIAGNOSIS — Z79899 Other long term (current) drug therapy: Secondary | ICD-10-CM | POA: Diagnosis not present

## 2016-09-09 DIAGNOSIS — I255 Ischemic cardiomyopathy: Secondary | ICD-10-CM

## 2016-09-09 DIAGNOSIS — Z716 Tobacco abuse counseling: Secondary | ICD-10-CM | POA: Diagnosis not present

## 2016-09-09 MED ORDER — FUROSEMIDE 20 MG PO TABS: ORAL_TABLET | ORAL | 3 refills | Status: DC

## 2016-09-09 MED ORDER — SPIRONOLACTONE 25 MG PO TABS: 12.5000 mg | ORAL_TABLET | Freq: Every day | ORAL | 3 refills | Status: DC

## 2016-09-09 MED ORDER — SACUBITRIL-VALSARTAN 24-26 MG PO TABS: 1.0000 | ORAL_TABLET | Freq: Two times a day (BID) | ORAL | 0 refills | Status: DC

## 2016-09-09 NOTE — Patient Instructions (Signed)
Medication Instructions:  STOP potassium STOP benazapril  START spironolactone 12.5 mg (1/2 tablet) daily  START Entresto 24/26 1 tablet two times daily-FIRST DOSE Sunday 8/12  Take furosemide daily AS NEEDED for swelling  Labwork: Please return in 1 WEEK for FASTING lab work (CBC,  CMET, Lipids)  Testing/Procedures: NONE  Follow-Up: Your physician recommends that you schedule a follow-up appointment in: 1 MONTH with pharmacy for titration of Entresto and 2 MONTHS with Dr. Claiborne Billings.    Any Other Special Instructions Will Be Listed Below (If Applicable).     If you need a refill on your cardiac medications before your next appointment, please call your pharmacy.

## 2016-09-09 NOTE — Progress Notes (Signed)
Cardiology Office Note    Date:  09/11/2016   ID:  Daniel Benish., DOB 01-Aug-1945, MRN [id number]  PCP:  [name], MD  Cardiologist:  Shelva Majestic, MD    Initial office visit with me following his CABG revascularization surgery.  History of Present Illness:  Daniel Lembke. is a 71 y.o. male who presents for initial cardiology evaluation with me following his non-STEMI, and ultimate CABG revascularization surgery.  Daniel [name] has remote history of prostate CA and a history of tobacco use, COPD, hypertension, and kidney stones.  He developed chest and epigastric discomfort leading to a [facility] evaluation.  ECG was unremarkable, but troponins were greater than 65.  He continued to have ongoing chest discomfort and was brought emergently to the cardiac catheterization laboratory on April 9,018.  Catheterization by me revealed severe LV dysfunction with an EF of 20-25%.  There was akinesis of the distal inferoapical segment with significant hypokinesis globally consistent with an ischemic cardiomyopathy.  There was severe multivessel CAD with 30% ostial, 75% proximal, and total occlusion of a large LAD with distal collateralization; 50% proximal circumflex stenosis with total occlusion of the distal circumflex; and total chronic occlusion of the mid RCA with collateralization distally.  The the RV marginal branch and the the left coronary injection.  CBG surgery was recommended and he underwent successful CABG 3 by Dr. Erasmo Leventhal on 05/12/2016 with a LIMA to his diagonal, SVG to his LAD, and SVG to the PDA.  He developed atrial fibrillation on postop day for was treated with IV amiodarone with conversion to sinus rhythm prior to discharge.  He was seen by [place] straighter for initial evaluation on 05/30/2016.  The patient tells me he has resumed cigarette smoking after having quit for several months following his bypass.  He feels better since his bypass.  On  08/05/2016, he underwent a follow-up echo Doppler study.  This continues to show LV dysfunction with an EF of 30-35%, slightly improved from his 20-25%  at the time of his urgent cardiac catheterization.  He is unaware of recurrent chest pain.  He has been on Lotensin 40 mg daily, carvedilol 3.125 mg twice a day, furosemide 20 mg daily, supplemental potassium 10 mEq 3 days per week, and atorvastatin 80 mg.  He is unaware of palpitations.  He denies presyncope or syncope.  He presents for his initial evaluation with me.    Past Medical History:  Diagnosis Date  . Anxiety   . Atrial fibrillation ([place])    Limited episode, emergency room, June, 2013, spontaneous conversion to sinus rhythm, was evaluated By [name] 2013- no further visits.  Post-op (CABG) a-fib, converted on amio but pt self d/c'd this med due to DOE/side effect profile.  [name] BPH (benign prostatic hypertrophy) with urinary obstruction    Nocturia is his primary symptom.  Acute urinary retention occurred when he got CT with contrast fall 2016 ([name]).  [name] CAD (coronary artery disease) 05/2016   a. 05/2016: NSTEMI with cath showing 3-vessel disease. Underwent CABG on 05/12/16 with LIMA-D1, SVG-LAD, and SVG-PDA.   [name] COPD (chronic obstructive pulmonary disease) ([place]) fall 2016   Bullous changes noted on lower lung images of CT abd done by urology  . Diverticulitis   . GERD (gastroesophageal reflux disease)   . Has received pneumococcal vaccination   . Hyperlipidemia    statin intolerant  . Hypertension   . Ischemic cardiomyopathy 05/2016   EF  20-25% at the time of NSTEMI/CABG--plan for repeat echo approx 08/2016 for reassessment.  . Microscopic hematuria Fall 2016   CT nl except nonobstructing stones.  Cystoscopy normal 12/30/14 ([name])  . Nephrolithiasis    11 mm stone on R, 2 mm stone on L, ureters clear  . Non-STEMI (non-ST elevated myocardial infarction) ([place]) 05/2016   with impaired LV function  . 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  Urol ([name]) at [facility]; [name] referred him to [name] 03/2015: patient got bilat nerve sparing , robot assisted laparoscopic radical prostatectomy and pelvic lymphadenectomy.  As of 10/21/15 urol f/u, PSA undetectable (repeat 04/22/16 result pending as per urol f/u that day), pt chose close PSA surveillance over adjuvant radiation tx.  . Tobacco dependence    down to 1-2 cigs per day as of 11/2015  . Urinary retention 05/2015   occurred s/p foley removal    Past Surgical History:  Procedure Laterality Date  . aortic ultrasound  07/2012   NO AAA  . COLONOSCOPY  approx 2011 per pt   Done by surgeon in [place] after a bout of diverticulitis; normal per pt report--was told to repeat in 10 yrs.  . CORONARY ARTERY BYPASS GRAFT N/A 05/12/2016   Procedure: CORONARY ARTERY BYPASS GRAFTING (CABG) TIMES THREE USING LEFT INTERNAL MAMMARY ARTERY AND RIGHT SAPHENOUS LEG VEIN HARVESTED ENDOSCOPICALLY;  Surgeon: [name], MD;  Location: [facility];  Service: Open Heart Surgery;  Laterality: N/A;  . INGUINAL HERNIA REPAIR  2003 and 2004   Both sides.  [name] LEFT HEART CATH AND CORONARY ANGIOGRAPHY N/A 05/09/2016   Procedure: Left Heart Cath and Coronary Angiography;  Surgeon: [name], MD;  Location: [facility];  Service: Cardiovascular;  Laterality: N/A;  . LITHOTRIPSY  2004   Dr. [place] Pink in [place].  [name] LYMPHADENECTOMY Bilateral 04/30/2015   Procedure: PELVIC LYMPHADENECTOMY;  Surgeon: [name], MD;  Location: [facility];  Service: Urology;  Laterality: Bilateral;  . PROSTATE BIOPSY  02/05/15   Prostate adenocarcinoma: pt considering treatment options as of 02/19/15  . ROBOT ASSISTED LAPAROSCOPIC RADICAL PROSTATECTOMY N/A 04/30/2015   Procedure: XI ROBOTIC ASSISTED LAPAROSCOPIC RADICAL PROSTATECTOMY LEVEL 2;  Surgeon: [name], MD;  Location: [facility];  Service: Urology;  Laterality: N/A;  . TEE WITHOUT  CARDIOVERSION N/A 05/12/2016   Procedure: TRANSESOPHAGEAL ECHOCARDIOGRAM (TEE);  Surgeon: [name], MD;  Location: [facility];  Service: Open Heart Surgery;  Laterality: N/A;  . TRANSTHORACIC ECHOCARDIOGRAM  03/22/2006; 05/2016; 07/2016   2008: EF 65%, normal valves, no wall motion abnormalties, LA size normal.  05/2016 in context of non-STEMI, EF 20-25%, mild AV and MV regurg.  08/05/16: EF 30-35%, akinesis of mid lateral myoc, grd II DD, mild aortic regurg.    Current Medications: Outpatient Medications Prior to Visit  Medication Sig Dispense Refill  . ALPRAZolam (XANAX) 0.25 MG tablet TAKE ONE TABLET BY MOUTH AT BEDTIME AS NEEDED 30 tablet 5  . aspirin EC 325 MG EC tablet Take 1 tablet (325 mg total) by mouth daily. 30 tablet 0  . atorvastatin (LIPITOR) 80 MG tablet TAKE ONE TABLET BY MOUTH EVERY DAY AT 6PM 30 tablet 5  . carvedilol (COREG) 3.125 MG tablet TAKE ONE TABLET BY MOUTH TWICE DAILY WITH A MEAL 60 tablet 6  . Omega-3 Fatty Acids (FISH OIL) 1000 MG CAPS Take 1 capsule by mouth daily.    . Vitamin D, Cholecalciferol,  400 units CAPS Take 400 Units by mouth daily.     . benazepril (LOTENSIN) 20 MG tablet Take 1 tablet (20 mg total) by mouth daily. 30 tablet 1  . furosemide (LASIX) 20 MG tablet TAKE ONE TABLET BY MOUTH EVERY DAY 30 tablet 3  . potassium chloride (K-DUR,KLOR-CON) 10 MEQ tablet Take 10 mEq by mouth. Monday's, Wednesday's and Friday's     No facility-administered medications prior to visit.      Allergies:   Amlodipine; Contrast media [iodinated diagnostic agents]; and Hydralazine   Social History   Social History  . Marital status: Married    Spouse name: N/A  . Number of children: N/A  . Years of education: N/A   Social History Main Topics  . Smoking status: Former Smoker    Packs/day: 1.00    Years: 55.00    Types: Cigarettes  . Smokeless tobacco: Never Used     Comment: STOPPED AT SURGERY  . Alcohol use No  . Drug use: No  . Sexual activity: Not  Asked   Other Topics Concern  . None   Social History Narrative   Married, 2 grown children.   HS education.  Orig from [place], lives there now.   Occupation: maintenance.  Drives a tractor a lot, drives a pickup truck a lot, rides horses a lot.   Tobacco: 20 pack-yr hx (current as of 07/2012)   No drugs.   Alcohol: none     Family History:  [name] family history includes Cancer in his father; Cancer - Other (age of onset: 5) in his mother; Diabetes in his sister; Hypertension in his mother.   ROS General: Negative; No fevers, chills, or night sweats;  HEENT: Negative; No changes in vision or hearing, sinus congestion, difficulty swallowing Pulmonary: Positive for COPD; no recent wheezing, hemoptysis Cardiovascular: See history of present illness GI: Negative; No nausea, vomiting, diarrhea, or abdominal pain GU: History of prostate CVA Musculoskeletal: Negative; no myalgias, joint pain, or weakness Hematologic/Oncology: Negative; no easy bruising, bleeding Endocrine: Negative; no heat/cold intolerance; no diabetes Neuro: Negative; no changes in balance, headaches Skin: Negative; No rashes or skin lesions Psychiatric: Negative; No behavioral problems, depression Sleep: Negative; No snoring, daytime sleepiness, hypersomnolence, bruxism, restless legs, hypnogognic hallucinations, no cataplexy Other comprehensive 14 point system review is negative.   PHYSICAL EXAM:   VS:  BP (!) 162/86   Pulse (!) 56   Ht 5' 10.5" (1.791 m)   Wt 166 lb 12.8 oz (75.7 kg)   BMI 23.60 kg/m    Wt Readings from Last 3 Encounters:  09/09/16 166 lb 12.8 oz (75.7 kg)  08/18/16 167 lb (75.8 kg)  06/14/16 164 lb 9.6 oz (74.7 kg)    General: Alert, oriented, no distress.  Skin: normal turgor, no rashes, warm and dry HEENT: Normocephalic, atraumatic. Pupils equal round and reactive to light; sclera anicteric; extraocular muscles intact; Fundi , No exudates or hemorrhages.  Disc flat Nose  without nasal septal hypertrophy Mouth/Parynx benign; Mallinpatti scale 3 Neck: No JVD, no carotid bruits; normal carotid upstroke Lungs: clear to ausculatation and percussion; no wheezing or rales Chest wall: without tenderness to palpitation Heart: PMI not displaced, RRR, s1 s2 normal, 1/6 systolic murmur, no diastolic murmur, no rubs, gallops, thrills, or heaves Abdomen: soft, nontender; no hepatosplenomehaly, BS+; abdominal aorta nontender and not dilated by palpation. Back: no CVA tenderness Pulses 2+ Musculoskeletal: full range of motion, normal strength, no joint deformities Extremities: no clubbing cyanosis or edema, Homan's sign  negative  Neurologic: grossly nonfocal; Cranial nerves grossly wnl Psychologic: Normal mood and affect   Studies/Labs Reviewed:   EKG:  EKG is ordered today.  Sinus bradycardia 56 bpm with mild sinus arrhythmia and one isolated PVC with retrograde P wave.  Inferolateral ST changes.  Recent Labs: BMP Latest Ref Rng & Units 05/17/2016 05/15/2016 05/14/2016  Glucose 65 - 99 mg/dL 95 94 90  BUN 6 - 20 mg/dL 22(H) 23(H) 25(H)  Creatinine 0.61 - 1.24 mg/dL 0.77 0.75 0.77  Sodium 135 - 145 mmol/L 136 136 135  Potassium 3.5 - 5.1 mmol/L 3.7 4.0 3.9  Chloride 101 - 111 mmol/L 104 104 102  CO2 22 - 32 mmol/L '22 25 26  '$ Calcium 8.9 - 10.3 mg/dL 8.3(L) 8.3(L) 8.4(L)     Hepatic Function Latest Ref Rng & Units 03/30/2016 07/20/2015 08/09/2013  Total Protein 6.0 - 8.3 g/dL 6.8 6.9 6.7  Albumin 3.5 - 5.2 g/dL 4.4 4.2 4.1  AST 0 - 37 U/L '25 15 23  '$ ALT 0 - 53 U/L '16 12 17  '$ Alk Phosphatase 39 - 117 U/L 68 80 76  Total Bilirubin 0.2 - 1.2 mg/dL 0.8 0.6 0.8    CBC Latest Ref Rng & Units 05/20/2016 05/15/2016 05/14/2016  WBC 4.0 - 10.5 K/uL 8.7 8.4 10.3  Hemoglobin 13.0 - 17.0 g/dL 12.0(L) 11.0(L) 10.8(L)  Hematocrit 39.0 - 52.0 % 36.1(L) 32.6(L) 33.2(L)  Platelets 150.0 - 400.0 K/uL 311.0 118(L) 115(L)   Lab Results  Component Value Date   MCV 94.7 05/20/2016    MCV 91.8 05/15/2016   MCV 94.1 05/14/2016   Lab Results  Component Value Date   TSH 1.181 05/16/2016   No results found for: HGBA1C   BNP No results found for: BNP  ProBNP No results found for: PROBNP   Lipid Panel     Component Value Date/Time   CHOL 163 03/30/2016 0851   TRIG 83.0 03/30/2016 0851   HDL 34.50 (L) 03/30/2016 0851   CHOLHDL 5 03/30/2016 0851   VLDL 16.6 03/30/2016 0851   LDLCALC 112 (H) 03/30/2016 0851     RADIOLOGY: No results found.   Additional studies/ records that were reviewed today include:  I reviewed the patient's catheterization findings, operative note, discharge summary,  subsequent office visit and recent echo.    ASSESSMENT:    1. Ischemic cardiomyopathy   2. Non-ST elevation myocardial infarction (NSTEMI), subsequent episode of care ([place])   3. Coronary artery disease involving native coronary artery of native heart without angina pectoris   4. Essential hypertension   5. Hyperlipidemia LDL goal <70   6. Tobacco abuse counseling   7. PVC (premature ventricular contraction)   8. Chronic obstructive pulmonary disease, unspecified COPD type ([place])   9. Medication management      PLAN:  Daniel [name] is a 71 year old male who has a history of tobacco abuse, COPD, hypertension, prostate CA, hyperlipidemia, and GERD.  A non-ST segment elevation MI leading to his urgent cardiac catheterization on 05/09/2016.  With his demonstration of severe multivessel CAD.  He ultimately underwent CABG revascularization surgery.  He has an ischemic cardiomyopathy and his initial ejection fraction of 20-25% has improved slightly to 30-35%.  He has grade 2 diastolic dysfunction and aortic valve sclerosis with mild aortic insufficiency and mild left atrial dilatation.  I reviewed his catheterization findings in detail as well as his most recent echo study.  His blood pressure today was elevated.  I believe he is a good candidate  for initiation of  Entresto.   I discussed the benefits of ARNI therapy. He has taken his dose of benazapril as of this morning.  I have suggested that he discontinue this and in 2 days he will initiatethe starting dose of entresto at 24/26 twice a day.  I have recommended he change is furosemide to as needed and discontinue supplemental potassium.  At present there are no signs of edema.  In one month, he will undergo dose titration, and I will schedule him for an evaluation with our pharmacists such that if he is doing well and tolerating initiation of treatment his dose can be further titrated to 49/51  twice a day.  His ECG today shows sinus bradycardia with an isolated PVC.  Since his pulse was slow and at present, I will continue him on his low-dose carvedilol.  He is maintaining sinus rhythm and is no longer on amiodarone.  Per his last office note, he had noticed some worsening dyspnea at nighttime associated with amiodarone therapy, which I suspect was also contributed by his underlying COPD.  He recently has resumed smoking.  I had a long discussion with him today concerning the absolute importance of complete smoking cessation.  Repeat blood work will be obtained in 2 weeks to make certain he is tolerating initiation of entresto.  He will see the pharmD in one month and I will see him in 2 months for follow-up cardiology evaluation.   Medication Adjustments/Labs and Tests Ordered: Current medicines are reviewed at length with the patient today.  Concerns regarding medicines are outlined above.  Medication changes, Labs and Tests ordered today are listed in the Patient Instructions below. Patient Instructions  Medication Instructions:  STOP potassium STOP benazapril  START spironolactone 12.5 mg (1/2 tablet) daily  START Entresto 24/26 1 tablet two times daily-FIRST DOSE Sunday 8/12  Take furosemide daily AS NEEDED for swelling  Labwork: Please return in 1 WEEK for FASTING lab work (CBC,  CMET,  Lipids)  Testing/Procedures: NONE  Follow-Up: Your physician recommends that you schedule a follow-up appointment in: 1 MONTH with pharmacy for titration of Entresto and 2 MONTHS with Dr. Claiborne Billings.    Any Other Special Instructions Will Be Listed Below (If Applicable).     If you need a refill on your cardiac medications before your next appointment, please call your pharmacy.      Signed, Shelva Majestic, MD  09/11/2016 2:44 PM    [facility] [address], [place], [place], [place]  [address] Phone: [phone]

## 2016-09-14 NOTE — Addendum Note (Signed)
Addended by: Zebedee Iba on: 09/14/2016 04:04 PM   Modules accepted: Orders

## 2016-09-16 ENCOUNTER — Other Ambulatory Visit: Payer: Self-pay | Admitting: *Deleted

## 2016-09-16 DIAGNOSIS — E785 Hyperlipidemia, unspecified: Secondary | ICD-10-CM | POA: Diagnosis not present

## 2016-09-16 DIAGNOSIS — E875 Hyperkalemia: Secondary | ICD-10-CM

## 2016-09-16 DIAGNOSIS — I255 Ischemic cardiomyopathy: Secondary | ICD-10-CM | POA: Diagnosis not present

## 2016-09-16 DIAGNOSIS — I10 Essential (primary) hypertension: Secondary | ICD-10-CM | POA: Diagnosis not present

## 2016-09-16 LAB — CBC
Hematocrit: 45.6 % (ref 37.5–51.0)
Hemoglobin: 15.4 g/dL (ref 13.0–17.7)
MCH: 30.3 pg (ref 26.6–33.0)
MCHC: 33.8 g/dL (ref 31.5–35.7)
MCV: 90 fL (ref 79–97)
PLATELETS: 186 10*3/uL (ref 150–379)
RBC: 5.09 x10E6/uL (ref 4.14–5.80)
RDW: 14.8 % (ref 12.3–15.4)
WBC: 5 10*3/uL (ref 3.4–10.8)

## 2016-09-16 LAB — COMPREHENSIVE METABOLIC PANEL
ALBUMIN: 4.3 g/dL (ref 3.5–4.8)
ALT: 11 IU/L (ref 0–44)
AST: 20 IU/L (ref 0–40)
Albumin/Globulin Ratio: 2 (ref 1.2–2.2)
Alkaline Phosphatase: 86 IU/L (ref 39–117)
BUN / CREAT RATIO: 17 (ref 10–24)
BUN: 18 mg/dL (ref 8–27)
Bilirubin Total: 0.8 mg/dL (ref 0.0–1.2)
CO2: 24 mmol/L (ref 20–29)
CREATININE: 1.04 mg/dL (ref 0.76–1.27)
Calcium: 10 mg/dL (ref 8.6–10.2)
Chloride: 104 mmol/L (ref 96–106)
GFR, EST AFRICAN AMERICAN: 83 mL/min/{1.73_m2} (ref >59)
GFR, EST NON AFRICAN AMERICAN: 72 mL/min/{1.73_m2} (ref >59)
GLOBULIN, TOTAL: 2.2 g/dL (ref 1.5–4.5)
GLUCOSE: 97 mg/dL (ref 65–99)
Potassium: 5.6 mmol/L — ABNORMAL HIGH (ref 3.5–5.2)
SODIUM: 140 mmol/L (ref 134–144)
TOTAL PROTEIN: 6.5 g/dL (ref 6.0–8.5)

## 2016-09-16 LAB — LIPID PANEL
CHOL/HDL RATIO: 3.9 ratio (ref 0.0–5.0)
Cholesterol, Total: 137 mg/dL (ref 100–199)
HDL: 35 mg/dL — ABNORMAL LOW (ref >39)
LDL CALC: 88 mg/dL (ref 0–99)
Triglycerides: 68 mg/dL (ref 0–149)
VLDL CHOLESTEROL CAL: 14 mg/dL (ref 5–40)

## 2016-09-19 ENCOUNTER — Telehealth: Payer: Self-pay | Admitting: Cardiovascular Disease

## 2016-09-19 NOTE — Telephone Encounter (Signed)
New message    Pt is calling back from Friday. Please call.

## 2016-09-19 NOTE — Telephone Encounter (Signed)
PATIENT NOT available , spoke to wife. She states that patient is aware - of message  From 09/16/16. Stop spironolactone. Per wife patient stopped 09/17/17  aware to come to lab on Thursday or Friday of this week 8/23 or 8/24 . Wife aware the patient does not need to be fasting. Time 8 4:30 pm close 12:30 - 1:30 Verbalized understanding.

## 2016-09-20 ENCOUNTER — Telehealth: Payer: Self-pay | Admitting: Cardiovascular Disease

## 2016-09-20 NOTE — Telephone Encounter (Signed)
F/u Message  Pt call requesting to speak with RN to f/u on medication. Please call back to discuss

## 2016-09-20 NOTE — Telephone Encounter (Signed)
Returned call to patient.He stated he has been taking Entresto for the past 2 weeks.He has noticed he itches all over at night.Stated he is outside a lot and thought itching coming from mite bites or Entresto.Advised to spray body with Off before going outside to see if helps.Advised to keep appointment with Pharmacist as planned, but call sooner if itching continues.

## 2016-09-20 NOTE — Telephone Encounter (Signed)
Thanks for the update. No additional recommendations at this time.

## 2016-09-23 DIAGNOSIS — E875 Hyperkalemia: Secondary | ICD-10-CM | POA: Diagnosis not present

## 2016-09-23 LAB — BASIC METABOLIC PANEL
BUN / CREAT RATIO: 16 (ref 10–24)
BUN: 15 mg/dL (ref 8–27)
CALCIUM: 9.5 mg/dL (ref 8.6–10.2)
CHLORIDE: 103 mmol/L (ref 96–106)
CO2: 26 mmol/L (ref 20–29)
Creatinine, Ser: 0.94 mg/dL (ref 0.76–1.27)
GFR calc non Af Amer: 81 mL/min/{1.73_m2} (ref >59)
GFR, EST AFRICAN AMERICAN: 94 mL/min/{1.73_m2} (ref >59)
Glucose: 131 mg/dL — ABNORMAL HIGH (ref 65–99)
POTASSIUM: 4.7 mmol/L (ref 3.5–5.2)
Sodium: 141 mmol/L (ref 134–144)

## 2016-09-27 ENCOUNTER — Ambulatory Visit (INDEPENDENT_AMBULATORY_CARE_PROVIDER_SITE_OTHER): Payer: Medicare Other | Admitting: Family Medicine

## 2016-09-27 ENCOUNTER — Encounter: Payer: Self-pay | Admitting: Family Medicine

## 2016-09-27 VITALS — BP 144/90 | HR 53 | Temp 97.3°F | Resp 16 | Ht 70.5 in | Wt 165.0 lb

## 2016-09-27 DIAGNOSIS — I10 Essential (primary) hypertension: Secondary | ICD-10-CM

## 2016-09-27 DIAGNOSIS — I255 Ischemic cardiomyopathy: Secondary | ICD-10-CM

## 2016-09-27 DIAGNOSIS — Z0000 Encounter for general adult medical examination without abnormal findings: Secondary | ICD-10-CM

## 2016-09-27 DIAGNOSIS — F418 Other specified anxiety disorders: Secondary | ICD-10-CM | POA: Diagnosis not present

## 2016-09-27 DIAGNOSIS — I2510 Atherosclerotic heart disease of native coronary artery without angina pectoris: Secondary | ICD-10-CM

## 2016-09-27 NOTE — Patient Instructions (Signed)

## 2016-09-27 NOTE — Progress Notes (Signed)
Subjective:   Daniel [name]. is a 71 y.o. male who presents for an Initial Medicare Annual Wellness Visit.  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   Review of Systems  No ROS.  Medicare Wellness Visit. Additional risk factors are reflected in the social history.  Cardiac Risk Factors include: advanced age (>81men, >50 women);dyslipidemia;hypertension;male gender   Sleep patterns: Sleeps 8-10 hours, feel rested.  Home Safety/Smoke Alarms: Feels safe in home. Smoke alarms in place.  Living environment; residence and Firearm Safety: Lives with wife in 1 story home.  Seat Belt Safety/Bike Helmet: Wears seat belt.   Male:   CCS-Colonoscopy 02/02/2009, pt reports normal. States insurance sent Cologuard kit to home recently, has not completed.   PSA-  Lab Results  Component Value Date   PSA <0.02 10/15/2015   PSA 17.82 (H) 12/22/2014   PSA 13.92 (H) 11/14/2014      Objective:    Today's Vitals   09/27/16 0804  BP: (!) 144/90  Pulse: (!) 53  Resp: 16  Temp: (!) 97.3 F (36.3 C)  TempSrc: Oral  SpO2: 99%  Weight: 165 lb (74.8 kg)  Height: 5' 10.5" (1.791 m)   Body mass index is 23.34 kg/m.  Current Medications (verified) Outpatient Encounter Prescriptions as of 09/27/2016  Medication Sig  . ALPRAZolam (XANAX) 0.25 MG tablet TAKE ONE TABLET BY MOUTH AT BEDTIME AS NEEDED  . aspirin EC 325 MG EC tablet Take 1 tablet (325 mg total) by mouth daily.  [name] atorvastatin (LIPITOR) 80 MG tablet TAKE ONE TABLET BY MOUTH EVERY DAY AT 6PM  . carvedilol (COREG) 3.125 MG tablet TAKE ONE TABLET BY MOUTH TWICE DAILY WITH A MEAL  . furosemide (LASIX) 20 MG tablet Take 20mg  (1 tablet) as needed for swelling.  . Omega-3 Fatty Acids (FISH OIL) 1000 MG CAPS Take 1 capsule by mouth daily.  . sacubitril-valsartan (ENTRESTO) 24-26 MG Take 1 tablet by mouth 2 (two) times daily.  . Vitamin D, Cholecalciferol, 400 units CAPS Take 400 Units by mouth daily.   . [DISCONTINUED] spironolactone (ALDACTONE)  25 MG tablet Take 0.5 tablets (12.5 mg total) by mouth daily. (Patient not taking: Reported on 09/27/2016)   No facility-administered encounter medications on file as of 09/27/2016.     Allergies (verified) Amlodipine; Contrast media [iodinated diagnostic agents]; and Hydralazine   History: Past Medical History:  Diagnosis Date  . Anxiety   . Atrial fibrillation (HCC)    Limited episode, emergency room, June, 2013, spontaneous conversion to sinus rhythm, was evaluated By Dr. 02[birth year]20-1975 2013- no further visits.  Post-op (CABG) a-fib, converted on amio but pt self d/c'd this med due to DOE/side effect profile.  2014 BPH (benign prostatic hypertrophy) with urinary obstruction    Nocturia is his primary symptom.  Acute urinary retention occurred when he got CT with contrast fall 2016 (Dr. 2017).  [place] Guard CAD (coronary artery disease) 05/2016   a. 05/2016: NSTEMI with cath showing 3-vessel disease. Underwent CABG on 05/12/16 with LIMA-D1, SVG-LAD, and SVG-PDA.   07/12/16 COPD (chronic obstructive pulmonary disease) (HCC) fall 2016   Bullous changes noted on lower lung images of CT abd done by urology  . Diverticulitis   . GERD (gastroesophageal reflux disease)   . Has received pneumococcal vaccination   . Hyperlipidemia    statin intolerant  . Hypertension   . Ischemic cardiomyopathy 05/2016   EF 20-25% at the time of NSTEMI/CABG--plan for repeat echo approx 08/2016 for reassessment.  . Microscopic hematuria Fall  2016   CT nl except nonobstructing stones.  Cystoscopy normal 12/30/14 ([name])  . Nephrolithiasis    11 mm stone on R, 2 mm stone on L, ureters clear  . Non-STEMI (non-ST elevated myocardial infarction) ([place]) 05/2016   with impaired LV function  . 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  Urol ([name]) at [facility]; [name] referred him to [name] 03/2015: patient got bilat nerve sparing , robot assisted laparoscopic radical prostatectomy  and pelvic lymphadenectomy.  As of 10/21/15 urol f/u, PSA undetectable (repeat 04/22/16 result pending as per urol f/u that day), pt chose close PSA surveillance over adjuvant radiation tx.  . Tobacco dependence    down to 1-2 cigs per day as of 11/2015  . Urinary retention 05/2015   occurred s/p foley removal   Past Surgical History:  Procedure Laterality Date  . aortic ultrasound  07/2012   NO AAA  . COLONOSCOPY  approx 2011 per pt   Done by surgeon in [place] after a bout of diverticulitis; normal per pt report--was told to repeat in 10 yrs.  . CORONARY ARTERY BYPASS GRAFT N/A 05/12/2016   Procedure: CORONARY ARTERY BYPASS GRAFTING (CABG) TIMES THREE USING LEFT INTERNAL MAMMARY ARTERY AND RIGHT SAPHENOUS LEG VEIN HARVESTED ENDOSCOPICALLY;  Surgeon: [name], MD;  Location: [facility];  Service: Open Heart Surgery;  Laterality: N/A;  . INGUINAL HERNIA REPAIR  2003 and 2004   Both sides.  [name] LEFT HEART CATH AND CORONARY ANGIOGRAPHY N/A 05/09/2016   Procedure: Left Heart Cath and Coronary Angiography;  Surgeon: [name], MD;  Location: [facility];  Service: Cardiovascular;  Laterality: N/A;  . LITHOTRIPSY  2004   Dr. Maryland Pink in [place].  [name] LYMPHADENECTOMY Bilateral 04/30/2015   Procedure: PELVIC LYMPHADENECTOMY;  Surgeon: [name], MD;  Location: [facility];  Service: Urology;  Laterality: Bilateral;  . PROSTATE BIOPSY  02/05/15   Prostate adenocarcinoma: pt considering treatment options as of 02/19/15  . ROBOT ASSISTED LAPAROSCOPIC RADICAL PROSTATECTOMY N/A 04/30/2015   Procedure: XI ROBOTIC ASSISTED LAPAROSCOPIC RADICAL PROSTATECTOMY LEVEL 2;  Surgeon: [name], MD;  [facility];  Service: Urology;  Laterality: N/A;  . TEE WITHOUT CARDIOVERSION N/A 05/12/2016   Procedure: TRANSESOPHAGEAL ECHOCARDIOGRAM (TEE);  Surgeon: [name], MD;  Location: [facility];  Service: Open Heart Surgery;  Laterality: N/A;  . TRANSTHORACIC ECHOCARDIOGRAM  03/22/2006; 05/2016; 07/2016    2008: EF 65%, normal valves, no wall motion abnormalties, LA size normal.  05/2016 in context of non-STEMI, EF 20-25%, mild AV and MV regurg.  08/05/16: EF 30-35%, akinesis of mid lateral myoc, grd II DD, mild aortic regurg.   Family History  Problem Relation Age of Onset  . Hypertension Mother   . Cancer - Other Mother 80       breast  . Cancer Father        Lung ca age 81  . Diabetes Sister    Social History   Occupational History  . Not on file.   Social History Main Topics  . Smoking status: Former Smoker    Packs/day: 1.00    Years: 55.00    Types: Cigarettes  . Smokeless tobacco: Never Used     Comment: STOPPED AT SURGERY  . Alcohol use No  . Drug use: No  . Sexual activity: Not on file   Tobacco Counseling Counseling given: Not Answered   Activities of Daily Living In  your present state of health, do you have any difficulty performing the following activities: 09/27/2016 05/11/2016  Hearing? Y N  Vision? N N  Difficulty concentrating or making decisions? N N  Walking or climbing stairs? N N  Dressing or bathing? N N  Doing errands, shopping? N Y  Conservation officer, nature and eating ? N -  Using the Toilet? N -  In the past six months, have you accidently leaked urine? N -  Do you have problems with loss of bowel control? N -  Managing your Medications? N -  Managing your Finances? N -  Housekeeping or managing your Housekeeping? N -  Some recent data might be hidden    Immunizations and Health Maintenance There is no immunization history for the selected administration types on file for this patient. There are no preventive care reminders to display for this patient.  Patient Care Team: [name], MD as PCP - General (Family Medicine) Carlena Bjornstad, MD as Consulting Physician (Cardiology) Sadie Haber, MD as Consulting Physician (Urology) [name], MD as Consulting Physician (Urology) [name], MD as Consulting Physician (Cardiothoracic  Surgery) [name], MD as Consulting Physician (Cardiology)  Indicate any recent Medical Services you may have received from other than [facility] providers in the past year (date may be approximate).    Assessment:   This is a routine wellness examination for Matilde. Physical assessment deferred to PCP.   Hearing/Vision screen Hearing Screening Comments: Declines hearing testing.  Vision Screening Comments: Last exam 2017, yearly at [facility]. Wears glasses.   Dietary issues and exercise activities discussed: Exercise limited by: None identified   Diet (meal preparation, eat out, water intake, caffeinated beverages, dairy products, fruits and vegetables): Drinks water and occasional soda.   Breakfast: bacon, eggs, sausage Lunch: sandwich; pinto and corn bread Dinner: vegetables, protein    Goals      Patient Stated   . patient (pt-stated)          Maintain current health by staying active.       Depression Screen PHQ 2/9 Scores 09/27/2016 11/27/2015 08/28/2014  PHQ - 2 Score 0 0 1    Fall Risk Fall Risk  09/27/2016 11/27/2015 08/28/2014  Falls in the past year? No No No    Cognitive Function:       Ad8 score reviewed for issues:  Issues making decisions: no  Less interest in hobbies / activities: no  Repeats questions, stories (family complaining): no  Trouble using ordinary gadgets (microwave, computer, phone): no  Forgets the month or year: no  Mismanaging finances: no  Remembering appts: no  Daily problems with thinking and/or memory: no Ad8 score is=0     Screening Tests Health Maintenance  Topic Date Due  . PNA vac Low Risk Adult (1 of 2 - PCV13) 09/27/2017 (Originally 05/02/2010)  . COLONOSCOPY  02/03/2019  . TETANUS/TDAP  02/03/2019  . Hepatitis C Screening  Completed   Declines PCV13, Flu and Shingles vaccine.       Plan:     I have personally reviewed and noted the following in the patient's chart:   . Medical and social  history . Use of alcohol, tobacco or illicit drugs  . Current medications and supplements . Functional ability and status . Nutritional status . Physical activity . Advanced directives . List of other physicians . Hospitalizations, surgeries, and ER visits in previous 12 months . Vitals . Screenings to include cognitive, depression, and falls . Referrals  and appointments  In addition, I have reviewed and discussed with patient certain preventive protocols, quality metrics, and best practice recommendations. A written personalized care plan for preventive services as well as general preventive health recommendations were provided to patient.     Gerilyn Nestle, RN   09/27/2016

## 2016-09-27 NOTE — Progress Notes (Signed)
AWV reviewed and agree.  Signed:  Crissie Sickles, MD           09/27/2016

## 2016-09-27 NOTE — Progress Notes (Signed)
OFFICE VISIT  09/27/2016   CC:  Chief Complaint  Patient presents with  . Follow-up    RCI, pt is fasting.  . Medicare Wellness   HPI:    Patient is a 71 y.o. Caucasian male who presents for f/u HTN, CAD s/p CABG about 4 mo ago--with mod ischemic cardiomyopathy, HLD with hx of statin intolerance. Hx of PAF with conversion on amio, but pt self d/c'd this med due to side effects.  On 09/16/16, cardiology transitioned him from lotensin to low dose entresto.  He had K of 5.6 and was told to stop potassium supplement, eat low K diet, and repeat K 1 week later was 4.7.  Aldactone was d/c'd as well.  He is doing well.  He is working, walking, bailing hay (w/out heavy lifting)---without CP or SOB or palpitations. Home bp monitoring 140/80s. Minimal LE swelling at the end of the day.  Has pharmacist f/u 10/13/16. HLD: taking atorva 80 mg and has no side effects.  Still with extra stress/anxiety lately due to some family issues, has been taking 1/2 of 0.25mg  xanax qAM as well as his tab in evening.   Past Medical History:  Diagnosis Date  . Anxiety   . Atrial fibrillation ([place])    Limited episode, emergency room, June, 2013, spontaneous conversion to sinus rhythm, was evaluated By [name] 2013- no further visits.  Post-op (CABG) a-fib, converted on amio but pt self d/c'd this med due to DOE/side effect profile.  [name] BPH (benign prostatic hypertrophy) with urinary obstruction    Nocturia is his primary symptom.  Acute urinary retention occurred when he got CT with contrast fall 2016 ([name]).  [name] CAD (coronary artery disease) 05/2016   a. 05/2016: NSTEMI with cath showing 3-vessel disease. Underwent CABG on 05/12/16 with LIMA-D1, SVG-LAD, and SVG-PDA.   [name] COPD (chronic obstructive pulmonary disease) ([place]) fall 2016   Bullous changes noted on lower lung images of CT abd done by urology  . Diverticulitis   . GERD (gastroesophageal reflux disease)   . Has received pneumococcal vaccination    . Hyperlipidemia    statin intolerant  . Hypertension   . Ischemic cardiomyopathy 05/2016   EF 20-25% at the time of NSTEMI/CABG--plan for repeat echo approx 08/2016 for reassessment.  . Microscopic hematuria Fall 2016   CT nl except nonobstructing stones.  Cystoscopy normal 12/30/14 ([name])  . Nephrolithiasis    11 mm stone on R, 2 mm stone on L, ureters clear  . Non-STEMI (non-ST elevated myocardial infarction) ([place]) 05/2016   with impaired LV function  . 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  [facility]rol ([name]) at [facility]; [name] referred him to [name] 03/2015: patient got bilat nerve sparing , robot assisted laparoscopic radical prostatectomy and pelvic lymphadenectomy.  As of 10/21/15 urol f/u, PSA undetectable (repeat 04/22/16 result pending as per urol f/u that day), pt chose close PSA surveillance over adjuvant radiation tx.  . Tobacco dependence    down to 1-2 cigs per day as of 11/2015  . Urinary retention 05/2015   occurred s/p foley removal    Past Surgical History:  Procedure Laterality Date  . aortic ultrasound  07/2012   NO AAA  . COLONOSCOPY  approx 2011 per pt   Done by surgeon in [place] after a bout of diverticulitis; normal per pt report--was told to repeat in 10 yrs.  . CORONARY ARTERY BYPASS GRAFT N/A 05/12/2016  Procedure: CORONARY ARTERY BYPASS GRAFTING (CABG) TIMES THREE USING LEFT INTERNAL MAMMARY ARTERY AND RIGHT SAPHENOUS LEG VEIN HARVESTED ENDOSCOPICALLY;  Surgeon: [name], MD;  Location: [facility];  Service: Open Heart Surgery;  Laterality: N/A;  . INGUINAL HERNIA REPAIR  2003 and 2004   Both sides.  [name] LEFT HEART CATH AND CORONARY ANGIOGRAPHY N/A 05/09/2016   Procedure: Left Heart Cath and Coronary Angiography;  Surgeon: [name], MD;  Location: [facility];  Service: Cardiovascular;  Laterality: N/A;  . LITHOTRIPSY  2004   Dr. Maryland Pink in [place].  [name] LYMPHADENECTOMY Bilateral  04/30/2015   Procedure: PELVIC LYMPHADENECTOMY;  Surgeon: [name], MD;  [facility];  Service: Urology;  Laterality: Bilateral;  . PROSTATE BIOPSY  02/05/15   Prostate adenocarcinoma: pt considering treatment options as of 02/19/15  . ROBOT ASSISTED LAPAROSCOPIC RADICAL PROSTATECTOMY N/A 04/30/2015   Procedure: XI ROBOTIC ASSISTED LAPAROSCOPIC RADICAL PROSTATECTOMY LEVEL 2;  Surgeon: [name], MD;  [facility];  Service: Urology;  Laterality: N/A;  . TEE WITHOUT CARDIOVERSION N/A 05/12/2016   Procedure: TRANSESOPHAGEAL ECHOCARDIOGRAM (TEE);  Surgeon: [name], MD;  Location: [facility];  Service: Open Heart Surgery;  Laterality: N/A;  . TRANSTHORACIC ECHOCARDIOGRAM  03/22/2006; 05/2016; 07/2016   2008: EF 65%, normal valves, no wall motion abnormalties, LA size normal.  05/2016 in context of non-STEMI, EF 20-25%, mild AV and MV regurg.  08/05/16: EF 30-35%, akinesis of mid lateral myoc, grd II DD, mild aortic regurg.    Outpatient Medications Prior to Visit  Medication Sig Dispense Refill  . ALPRAZolam (XANAX) 0.25 MG tablet TAKE ONE TABLET BY MOUTH AT BEDTIME AS NEEDED 30 tablet 5  . aspirin EC 325 MG EC tablet Take 1 tablet (325 mg total) by mouth daily. 30 tablet 0  . atorvastatin (LIPITOR) 80 MG tablet TAKE ONE TABLET BY MOUTH EVERY DAY AT 6PM 30 tablet 5  . carvedilol (COREG) 3.125 MG tablet TAKE ONE TABLET BY MOUTH TWICE DAILY WITH A MEAL 60 tablet 6  . furosemide (LASIX) 20 MG tablet Take 20mg  (1 tablet) as needed for swelling. 30 tablet 3  . Omega-3 Fatty Acids (FISH OIL) 1000 MG CAPS Take 1 capsule by mouth daily.    . sacubitril-valsartan (ENTRESTO) 24-26 MG Take 1 tablet by mouth 2 (two) times daily. 56 tablet 0  . Vitamin D, Cholecalciferol, 400 units CAPS Take 400 Units by mouth daily.     [name] spironolactone (ALDACTONE) 25 MG tablet Take 0.5 tablets (12.5 mg total) by mouth daily. (Patient not taking: Reported on 09/27/2016) 30 tablet 3   No facility-administered  medications prior to visit.     Allergies  Allergen Reactions  . Amlodipine Other (See Comments)    LE swelling  . Contrast Media [Iodinated Diagnostic Agents] Other (See Comments)    Prostate problem had to wear a catheter for two weeks after having dye.   [name] Hydralazine Palpitations and Other (See Comments)    Fatigue+    ROS As per HPI  PE: Blood pressure (!) 144/90, pulse (!) 53, temperature (!) 97.3 F (36.3 C), temperature source Oral, resp. rate 16, height 5' 10.5" (1.791 m), weight 165 lb (74.8 kg), SpO2 99 %. Gen: Alert, well appearing.  Patient is oriented to person, place, time, and situation. AFFECT: pleasant, lucid thought and speech. CV: Regular, occ ectopic beat, no m/r/g Chest is clear, no wheezing or rales. Normal symmetric air entry throughout both lung fields. No chest wall deformities or  tenderness. EXT: no clubbing, cyanosis, or edema.    LABS:  Lab Results  Component Value Date   TSH 1.181 05/16/2016   Lab Results  Component Value Date   WBC 5.0 09/16/2016   HGB 15.4 09/16/2016   HCT 45.6 09/16/2016   MCV 90 09/16/2016   PLT 186 09/16/2016   Lab Results  Component Value Date   CREATININE 0.94 09/23/2016   BUN 15 09/23/2016   NA 141 09/23/2016   K 4.7 09/23/2016   CL 103 09/23/2016   CO2 26 09/23/2016   Lab Results  Component Value Date   ALT 11 09/16/2016   AST 20 09/16/2016   ALKPHOS 86 09/16/2016   BILITOT 0.8 09/16/2016   Lab Results  Component Value Date   CHOL 137 09/16/2016   Lab Results  Component Value Date   HDL 35 (L) 09/16/2016   Lab Results  Component Value Date   LDLCALC 88 09/16/2016   Lab Results  Component Value Date   TRIG 68 09/16/2016   Lab Results  Component Value Date   CHOLHDL 3.9 09/16/2016   Lab Results  Component Value Date   PSA <0.02 10/15/2015   PSA 17.82 (H) 12/22/2014   PSA 13.92 (H) 11/14/2014    IMPRESSION AND PLAN:  1) HTN: not ideal control.  Somewhat affected by stress lately.   No bp med changes today. Xanax 1 tab po bid prn now.  2) CAD with ischemic CM: asymptomatic, back to normal activities.  Compliant with meds, doing well with recent transition to entresto. Lytes/cr good after getting started on this med.  Has [facility] clinic pharmacy appt in early Sept for f/u and possible up-titration of entresto.  3) HLD: tolerating high dose atorva.  Recent lipid panel good--LDL 88.  AST/ALT normal.  4) Anxiety--situational:  Increased family problems/stress lately. Encouraged him to go ahead and take a whole 0.25mg  alprazolam twice daily instead of his usual hs-only dosing.  An After Visit Summary was printed and given to the patient.  FOLLOW UP: Return in about 3 months (around 12/28/2016) for routine chronic illness f/u.  Signed:  Crissie Sickles, MD           09/27/2016

## 2016-10-10 ENCOUNTER — Encounter: Payer: Self-pay | Admitting: Family Medicine

## 2016-10-13 ENCOUNTER — Ambulatory Visit (INDEPENDENT_AMBULATORY_CARE_PROVIDER_SITE_OTHER): Payer: Medicare Other | Admitting: Pharmacist

## 2016-10-13 DIAGNOSIS — I255 Ischemic cardiomyopathy: Secondary | ICD-10-CM | POA: Diagnosis not present

## 2016-10-13 MED ORDER — SACUBITRIL-VALSARTAN 49-51 MG PO TABS: 1.0000 | ORAL_TABLET | Freq: Two times a day (BID) | ORAL | 0 refills | Status: DC

## 2016-10-13 NOTE — Progress Notes (Signed)
Patient ID: Daniel [name].                 DOB: [birth year]                      MRN: [id number]     HPI: Timber Lucarelli. is a 71 y.o. male referred by Dr. Claiborne Billings to HTN clinic. PMH includes history of prostate CA, COPD, hypertension, ischemic cardiomyopathy with EF 20-25%, CAD, and hyperlipidemia. Patient was recently started on Entresto 24/26 twice daily by Dr Claiborne Billings. BMET done 2 weeks after Entresto initiation showed elevated potassium level and spironolactone therapy was discontinued by cardiologist's orders. Carvedilol 3.125mg  twice daily Patient presents today for Entresto titration. Denies increase fatigue, hypotension, swelling or dizziness.   Current HTN meds:  Entresto 24mg -26mg  twice dialy Furosemide 20mg  daily as needed Carvedilol 3.125mg  twice daily  Previously tried: spironolactone - d/c due to high potassium  BP goal: 130/80  Family History: mother and sister - bypass surgery and diabetes, htn  Social History: former smoker, daily coffee, denies alcohol use  Diet: low sodium diet, and low fat diet  Exercise: working in farm, yard work, Social research officer, government  Home BP readings: none provided today  Wt Readings from Last 3 Encounters:  09/27/16 165 lb (74.8 kg)  09/09/16 166 lb 12.8 oz (75.7 kg)  08/18/16 167 lb (75.8 kg)   BP Readings from Last 3 Encounters:  10/13/16 136/80  09/27/16 (!) 144/90  09/09/16 (!) 162/86   Pulse Readings from Last 3 Encounters:  10/13/16 (!) 54  09/27/16 (!) 53  09/09/16 (!) 56    Past Medical History:  Diagnosis Date  . Anxiety   . Atrial fibrillation ([place])    Limited episode, emergency room, June, 2013, spontaneous conversion to sinus rhythm, was evaluated By [name] 2013- no further visits.  Post-op (CABG) a-fib, converted on amio but pt self d/c'd this med due to DOE/side effect profile.  [name] BPH (benign prostatic hypertrophy) with urinary obstruction    Nocturia is his primary symptom.  Acute urinary retention occurred when he got CT with  contrast fall 2016 ([name]).  [name] CAD (coronary artery disease) 05/2016   a. 05/2016: NSTEMI with cath showing 3-vessel disease. Underwent CABG on 05/12/16 with LIMA-D1, SVG-LAD, and SVG-PDA.   [name] COPD (chronic obstructive pulmonary disease) ([place]) fall 2016   Bullous changes noted on lower lung images of CT abd done by urology  . Diverticulitis   . GERD (gastroesophageal reflux disease)   . Has received pneumococcal vaccination   . Hyperlipidemia    statin intolerant  . Hypertension   . Ischemic cardiomyopathy 05/2016   EF 20-25% at the time of NSTEMI/CABG.  Repeat EF 07/2016 30-35%.  ACE-I and potassium stopped and entresto and aldactone started 09/11/16.  . Microscopic hematuria Fall 2016   CT nl except nonobstructing stones.  Cystoscopy normal 12/30/14 ([name])  . Nephrolithiasis    11 mm stone on R, 2 mm stone on L, ureters clear  . Non-STEMI (non-ST elevated myocardial infarction) ([place]) 05/2016   with impaired LV function  . 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  Urol ([name]) at [facility]; [name] referred him to [name] 03/2015: patient got bilat nerve sparing , robot assisted laparoscopic radical prostatectomy and pelvic lymphadenectomy.  As of 10/21/15 urol f/u, PSA undetectable (repeat 04/22/16 result pending as per urol f/u that day), pt chose close  PSA surveillance over adjuvant radiation tx.  . Tobacco dependence    down to 1-2 cigs per day as of 11/2015.  Restarted 08/2016.  [name] Urinary retention 05/2015   occurred s/p foley removal    Current Outpatient Prescriptions on File Prior to Visit  Medication Sig Dispense Refill  . ALPRAZolam (XANAX) 0.25 MG tablet TAKE ONE TABLET BY MOUTH AT BEDTIME AS NEEDED 30 tablet 5  . aspirin EC 325 MG EC tablet Take 1 tablet (325 mg total) by mouth daily. 30 tablet 0  . atorvastatin (LIPITOR) 80 MG tablet TAKE ONE TABLET BY MOUTH EVERY DAY AT 6PM 30 tablet 5  . carvedilol (COREG)  3.125 MG tablet TAKE ONE TABLET BY MOUTH TWICE DAILY WITH A MEAL 60 tablet 6  . furosemide (LASIX) 20 MG tablet Take 20mg  (1 tablet) as needed for swelling. 30 tablet 3  . Omega-3 Fatty Acids (FISH OIL) 1000 MG CAPS Take 1 capsule by mouth daily.    . Vitamin D, Cholecalciferol, 400 units CAPS Take 400 Units by mouth daily.     . [DISCONTINUED] Cetirizine HCl (ZYRTEC ALLERGY) 10 MG CAPS Take 1 capsule (10 mg total) by mouth daily. 20 capsule 0   No current facility-administered medications on file prior to visit.     Allergies  Allergen Reactions  . Amlodipine Other (See Comments)    LE swelling  . Contrast Media [Iodinated Diagnostic Agents] Other (See Comments)    Prostate problem had to wear a catheter for two weeks after having dye.   [name] Hydralazine Palpitations and Other (See Comments)    Fatigue+    Blood pressure 136/80, pulse (!) 54.  Ischemic cardiomyopathy Patient tolerating Entresto 24/26 twice daily without problems. Denies dizziness, increased fatigue, swelling or hypotension.  Blood pressure today remains appropriate for further titration as well. Patient still has 1 week of Entresto 24/26 at home. Patient instructed to finish Entresto 24/26 twice daily, then increase to [facility] 49/51mg  twice daily and monitor BP daily. Plan to follow up in 3 weeks and further titration is possible.   Lloyd Ayo Rodriguez-Guzman PharmD, BCPS, [place] [address] 10/13/2016 9:08 PM

## 2016-10-13 NOTE — Patient Instructions (Addendum)
Return for a a follow up appointment in 3 weeks  Your blood pressure today is 136/80 pulse 54  Check your blood pressure at home daily (if able) and keep record of the readings.  Take your BP meds as follows: *Increase ENTRESTO 49/51 twice daily - start after 24/26 mg dose completed*  Bring all of your meds, your BP cuff and your record of home blood pressures to your next appointment.  Exercise as you're able, try to walk approximately 30 minutes per day.  Keep salt intake to a minimum, especially watch canned and prepared boxed foods.  Eat more fresh fruits and vegetables and fewer canned items.  Avoid eating in fast food restaurants.    HOW TO TAKE YOUR BLOOD PRESSURE: . Rest 5 minutes before taking your blood pressure. .  Don't smoke or drink caffeinated beverages for at least 30 minutes before. . Take your blood pressure before (not after) you eat. . Sit comfortably with your back supported and both feet on the floor (don't cross your legs). . Elevate your arm to heart level on a table or a desk. . Use the proper sized cuff. It should fit smoothly and snugly around your bare upper arm. There should be enough room to slip a fingertip under the cuff. The bottom edge of the cuff should be 1 inch above the crease of the elbow. . Ideally, take 3 measurements at one sitting and record the average.

## 2016-10-13 NOTE — Assessment & Plan Note (Signed)
Patient tolerating Entresto 24/26 twice daily without problems. Denies dizziness, increased fatigue, swelling or hypotension.  Blood pressure today remains appropriate for further titration as well. Patient still has 1 week of Entresto 24/26 at home. Patient instructed to finish Entresto 24/26 twice daily, then increase to Entresto 49/51mg  twice daily and monitor BP daily. Plan to follow up in 3 weeks and further titration is possible.

## 2016-10-24 ENCOUNTER — Encounter: Payer: Self-pay | Admitting: Family Medicine

## 2016-11-03 ENCOUNTER — Ambulatory Visit (INDEPENDENT_AMBULATORY_CARE_PROVIDER_SITE_OTHER): Payer: Medicare Other | Admitting: Pharmacist

## 2016-11-03 VITALS — BP 126/78 | HR 47

## 2016-11-03 DIAGNOSIS — I10 Essential (primary) hypertension: Secondary | ICD-10-CM | POA: Diagnosis not present

## 2016-11-03 DIAGNOSIS — I255 Ischemic cardiomyopathy: Secondary | ICD-10-CM | POA: Diagnosis not present

## 2016-11-03 LAB — BASIC METABOLIC PANEL
BUN / CREAT RATIO: 13 (ref 10–24)
BUN: 12 mg/dL (ref 8–27)
CO2: 24 mmol/L (ref 20–29)
CREATININE: 0.96 mg/dL (ref 0.76–1.27)
Calcium: 9.4 mg/dL (ref 8.6–10.2)
Chloride: 104 mmol/L (ref 96–106)
GFR calc non Af Amer: 79 mL/min/{1.73_m2} (ref >59)
GFR, EST AFRICAN AMERICAN: 92 mL/min/{1.73_m2} (ref >59)
Glucose: 79 mg/dL (ref 65–99)
Potassium: 4.2 mmol/L (ref 3.5–5.2)
Sodium: 142 mmol/L (ref 134–144)

## 2016-11-03 MED ORDER — SACUBITRIL-VALSARTAN 24-26 MG PO TABS: 1.0000 | ORAL_TABLET | Freq: Two times a day (BID) | ORAL | 5 refills | Status: DC

## 2016-11-03 NOTE — Assessment & Plan Note (Signed)
Blood pressure controlled but patient was unable to tolerate increase Entresto dose to 49/51. Currently taking Entresto 24/26 twice daily. Denies dizziness, fatigue or hypotension. Noted HR in the high 40s to high 50s, patient describes this as normal and asymptomatic. Patient was instructed to monitor HR daily and reports any increase in fatigue. Will continue current therapy without changes and follow up in 3 weeks with cardiologist as previously scheduled and with HTN clinic as needed.

## 2016-11-03 NOTE — Patient Instructions (Addendum)
Return for a  follow up appointment as needed  Your blood pressure today is 126/78 pulse 47   Check your blood pressure at home daily (if able) and keep record of the readings.  Take your BP meds as follows: *Continue all medication as prescribed*   Bring  your record of home blood pressures to your next appointment.  Exercise as you're able, try to walk approximately 30 minutes per day.  Keep salt intake to a minimum, especially watch canned and prepared boxed foods.  Eat more fresh fruits and vegetables and fewer canned items.  Avoid eating in fast food restaurants.    HOW TO TAKE YOUR BLOOD PRESSURE: . Rest 5 minutes before taking your blood pressure. .  Don't smoke or drink caffeinated beverages for at least 30 minutes before. . Take your blood pressure before (not after) you eat. . Sit comfortably with your back supported and both feet on the floor (don't cross your legs). . Elevate your arm to heart level on a table or a desk. . Use the proper sized cuff. It should fit smoothly and snugly around your bare upper arm. There should be enough room to slip a fingertip under the cuff. The bottom edge of the cuff should be 1 inch above the crease of the elbow. . Ideally, take 3 measurements at one sitting and record the average.

## 2016-11-03 NOTE — Progress Notes (Signed)
Patient ID: [name].                 DOB: 1945-07-19                      MRN: [id number]      KDT:OIZTI E Mutchler Brooke Bonito. is a 71 y.o. male referred by Dr. Claiborne Billings to HTN clinic. PMH includes history of prostate CA, COPD, hypertension, ischemic cardiomyopathy with EF 20-25%, CAD, and hyperlipidemia. Patient was recently started on Entresto 24/26 twice daily by Dr Claiborne Billings. BMET done 2 weeks after Entresto initiation showed elevated potassium level and spironolactone therapy was discontinued by cardiologist's orders. Patient presents today for Entresto titration. Reports increased fatigue and light-headedness after 1 week on Entresto 49-51mg  and he self-adjust Entresto to only 1/2 tablet twice daily.  Bloor pessure remain well control and patient feels "very good" after decreasing Entresto back to 24/26 twice daily.   Current HTN meds:  Entresto 24/26mg  twice dialy Furosemide 20mg  daily as needed Carvedilol 3.125mg  twice daily  Previously tried: spironolactone - d/c due to high potassium  BP goal: 130/80  Family History: mother and sister - bypass surgery and diabetes, htn  Social History: former smoker, daily coffee, denies alcohol use  Diet: low sodium diet, and low fat diet  Exercise: working in farm, yard work, Social research officer, government  Home BP readings:  17 readings; average 126/74 (pulse 45-57 bpm)  Wt Readings from Last 3 Encounters:  09/27/16 165 lb (74.8 kg)  09/09/16 166 lb 12.8 oz (75.7 kg)  08/18/16 167 lb (75.8 kg)   BP Readings from Last 3 Encounters:  11/03/16 126/78  10/13/16 136/80  09/27/16 (!) 144/90   Pulse Readings from Last 3 Encounters:  11/03/16 (!) 47  10/13/16 (!) 54  09/27/16 (!) 53     Past Medical History:  Diagnosis Date  . Anxiety   . Atrial fibrillation ([place])    Limited episode, emergency room, June, 2013, spontaneous conversion to sinus rhythm, was evaluated By [name] 2013- no further visits.  Post-op (CABG) a-fib, converted on amio but pt self d/c'd  this med due to DOE/side effect profile.  [name] BPH (benign prostatic hypertrophy) with urinary obstruction    Nocturia is his primary symptom.  Acute urinary retention occurred when he got CT with contrast fall 2016 ([name]).  [name] CAD (coronary artery disease) 05/2016   a. 05/2016: NSTEMI with cath showing 3-vessel disease. Underwent CABG on 05/12/16 with LIMA-D1, SVG-LAD, and SVG-PDA.   [name] COPD (chronic obstructive pulmonary disease) ([place]) fall 2016   Bullous changes noted on lower lung images of CT abd done by urology  . Diverticulitis   . GERD (gastroesophageal reflux disease)   . Has received pneumococcal vaccination   . Hyperlipidemia    statin intolerant  . Hypertension   . Ischemic cardiomyopathy 05/2016   EF 20-25% at the time of NSTEMI/CABG.  Repeat EF 07/2016 30-35%.  ACE-I and potassium stopped and entresto and aldactone started 09/11/16>>then hyperkalemia so aldactone d/c'd.  . Microscopic hematuria Fall 2016   CT nl except nonobstructing stones.  Cystoscopy normal 12/30/14 ([name])  . Nephrolithiasis    11 mm stone on R, 2 mm stone on L, ureters clear  . Non-STEMI (non-ST elevated myocardial infarction) ([place]) 05/2016   with impaired LV function  . 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  Urol ([name]) at [facility]; [name] referred him to  [name] 03/2015: patient got bilat nerve sparing , robot assisted laparoscopic radical prostatectomy and pelvic lymphadenectomy.  As of 10/21/15 urol f/u, PSA undetectable (repeat 04/22/16 result pending as per urol f/u that day), pt chose close PSA surveillance over adjuvant radiation tx.  . Tobacco dependence    down to 1-2 cigs per day as of 11/2015.  Restarted 08/2016.  [name] Urinary retention 05/2015   occurred s/p foley removal    Current Outpatient Prescriptions on File Prior to Visit  Medication Sig Dispense Refill  . ALPRAZolam (XANAX) 0.25 MG tablet TAKE ONE TABLET BY MOUTH AT  BEDTIME AS NEEDED 30 tablet 5  . aspirin EC 325 MG EC tablet Take 1 tablet (325 mg total) by mouth daily. 30 tablet 0  . atorvastatin (LIPITOR) 80 MG tablet TAKE ONE TABLET BY MOUTH EVERY DAY AT 6PM 30 tablet 5  . carvedilol (COREG) 3.125 MG tablet TAKE ONE TABLET BY MOUTH TWICE DAILY WITH A MEAL 60 tablet 6  . furosemide (LASIX) 20 MG tablet Take 20mg  (1 tablet) as needed for swelling. 30 tablet 3  . Omega-3 Fatty Acids (FISH OIL) 1000 MG CAPS Take 1 capsule by mouth daily.    . Vitamin D, Cholecalciferol, 400 units CAPS Take 400 Units by mouth daily.     . [DISCONTINUED] Cetirizine HCl (ZYRTEC ALLERGY) 10 MG CAPS Take 1 capsule (10 mg total) by mouth daily. 20 capsule 0   No current facility-administered medications on file prior to visit.     Allergies  Allergen Reactions  . Amlodipine Other (See Comments)    LE swelling  . Contrast Media [Iodinated Diagnostic Agents] Other (See Comments)    Prostate problem had to wear a catheter for two weeks after having dye.   [name] Hydralazine Palpitations and Other (See Comments)    Fatigue+    Blood pressure 126/78, pulse (!) 47.  Hypertension Blood pressure controlled but patient was unable to tolerate increase Entresto dose to 49/51. Currently taking Entresto 24/26 twice daily. Denies dizziness, fatigue or hypotension. Noted HR in the high 40s to high 50s, patient describes this as normal and asymptomatic. Patient was instructed to monitor HR daily and reports any increase in fatigue. Will continue current therapy without changes and follow up in 3 weeks with cardiologist as previously scheduled and with HTN clinic as needed.    Allona Gondek Rodriguez-Guzman PharmD, BCPS, [place] [address] [place] [address] 11/03/2016 10:14 PM

## 2016-11-09 ENCOUNTER — Telehealth: Payer: Self-pay | Admitting: Cardiovascular Disease

## 2016-11-09 NOTE — Telephone Encounter (Signed)
New message    Pt is calling back for results.

## 2016-11-09 NOTE — Telephone Encounter (Signed)
Returned call to patient lab results given. 

## 2016-11-17 ENCOUNTER — Ambulatory Visit (INDEPENDENT_AMBULATORY_CARE_PROVIDER_SITE_OTHER): Payer: Medicare Other | Admitting: Cardiovascular Disease

## 2016-11-17 ENCOUNTER — Encounter: Payer: Self-pay | Admitting: Cardiovascular Disease

## 2016-11-17 VITALS — BP 154/82 | HR 50 | Ht 71.0 in | Wt 168.6 lb

## 2016-11-17 DIAGNOSIS — Z716 Tobacco abuse counseling: Secondary | ICD-10-CM | POA: Diagnosis not present

## 2016-11-17 DIAGNOSIS — Z951 Presence of aortocoronary bypass graft: Secondary | ICD-10-CM

## 2016-11-17 DIAGNOSIS — E785 Hyperlipidemia, unspecified: Secondary | ICD-10-CM

## 2016-11-17 DIAGNOSIS — Z79899 Other long term (current) drug therapy: Secondary | ICD-10-CM

## 2016-11-17 DIAGNOSIS — I214 Non-ST elevation (NSTEMI) myocardial infarction: Secondary | ICD-10-CM

## 2016-11-17 DIAGNOSIS — J449 Chronic obstructive pulmonary disease, unspecified: Secondary | ICD-10-CM

## 2016-11-17 DIAGNOSIS — I255 Ischemic cardiomyopathy: Secondary | ICD-10-CM

## 2016-11-17 MED ORDER — SACUBITRIL-VALSARTAN 49-51 MG PO TABS
1.0000 | ORAL_TABLET | Freq: Two times a day (BID) | ORAL | 0 refills | Status: DC
Start: 2016-11-17 — End: 2016-11-24

## 2016-11-17 NOTE — Patient Instructions (Addendum)
  Medication Instructions:  INCREASE Entresto  ---take 49/51 mg in the AM and 24/26 mg in the PM for 2 weeks, then repeat lab work  --if labs are okay in 2 weeks and you are tolerating okay, we will increase to 49/51 mg two times daily.   Labwork: Please return for labs in 2 weeks (BMET) Our in office lab hours are Monday-Friday 8:00-4:30, closed for lunch 1-2 pm.  No appointment needed.  Follow-Up: Your physician recommends that you schedule a follow-up appointment in: 2 months with Dr. Claiborne Billings.    Any Other Special Instructions Will Be Listed Below (If Applicable).     If you need a refill on your cardiac medications before your next appointment, please call your pharmacy.

## 2016-11-17 NOTE — Progress Notes (Signed)
Cardiology Office Note    Date:  11/17/2016   ID:  Daniel [name]., DOB Aug 08, 1945, MRN [id number]  PCP:  [name], MD  Cardiologist:  Shelva Majestic, MD    Follow-up  History of Present Illness:  Daniel [name]. is a 71 y.o. male who presents for a 2 month follow-up cardiology evaluation.  Daniel [name] has remote history of prostate CA and a history of tobacco use, COPD, hypertension, and kidney stones.  He developed chest and epigastric discomfort leading to a [facility] evaluation.  ECG was unremarkable, but troponins were greater than 65.  He continued to have ongoing chest discomfort and was brought emergently to the cardiac catheterization laboratory on April 9,2018.  Catheterization by me revealed severe LV dysfunction with an EF of 20-25%.  There was akinesis of the distal inferoapical segment with significant hypokinesis globally consistent with an ischemic cardiomyopathy.  There was severe multivessel CAD with 30% ostial, 75% proximal, and total occlusion of a large LAD with distal collateralization; 50% proximal circumflex stenosis with total occlusion of the distal circumflex; and total chronic occlusion of the mid RCA with collateralization distally.  The the RV marginal branch and the the left coronary injection.  CABG surgery was recommended and he underwent successful CABG 3 by Dr. Erasmo Leventhal on 05/12/2016 with a LIMA to his diagonal, SVG to his LAD, and SVG to the PDA.  He developed atrial fibrillation on postop day for was treated with IV amiodarone with conversion to sinus rhythm prior to discharge.  He was seen by Bernerd Pho for initial evaluation on 05/30/2016. He resumed cigarette smoking after having quit for several months following his bypass.  He feels better since his bypass.  On 08/05/2016, he underwent a follow-up echo Doppler study revealingLV dysfunction with an EF of 30-35%, slightly improved from his 20-25%  at the time of his urgent  cardiac catheterization.    And I saw him for my initial office follow-up evaluation.  He was unaware of recurrent chest pain and had been on  Lotensin 40 mg daily, carvedilol 3.125 mg twice a day, furosemide 20 mg daily, supplemental potassium 10 mEq 3 days per week, and atorvastatin 80 mg.  he denied any palpitations.  There is no PND, orthopnea.  His LV dysfunction, I felt he was a good candidate for ARNI therapy.  I discontinued Lotensin and 48 hours later he began Entresto 2426 mg bid.  I discontinue supplemental potassium and recommended that he change  furosemide to as needed.  He was maintaining sinus rhythm and was no longer on amiodarone.  He had resumed smoking.  I had a long discussion regarding complete smoking cessation.  A BMET done 2 weeks after Entresto initiation showed an elevated potassium and spironolactone therapy was discontinued.  Several weeks later I had him follow-up with her pharmacist to see if his Delene Loll could be titrated at that time to 49/51.  This was started but after one week on increased dose.  He self adjusted it back to the initial dose because he had felt weak.  He quit smoking again.  He denies chest pain, palpitations, and he feels better.  He presents for evaluation.    Past Medical History:  Diagnosis Date  . Anxiety   . Atrial fibrillation ([place])    Limited episode, emergency room, June, 2013, spontaneous conversion to sinus rhythm, was evaluated By [name] 2013- no further visits.  Post-op (CABG) a-fib, converted on  amio but pt self d/c'd this med due to DOE/side effect profile.  [name] BPH (benign prostatic hypertrophy) with urinary obstruction    Nocturia is his primary symptom.  Acute urinary retention occurred when he got CT with contrast fall 2016 ([name]).  [name] CAD (coronary artery disease) 05/2016   a. 05/2016: NSTEMI with cath showing 3-vessel disease. Underwent CABG on 05/12/16 with LIMA-D1, SVG-LAD, and SVG-PDA.   [name] COPD (chronic obstructive  pulmonary disease) ([place]) fall 2016   Bullous changes noted on lower lung images of CT abd done by urology  . Diverticulitis   . GERD (gastroesophageal reflux disease)   . Has received pneumococcal vaccination   . Hyperlipidemia    statin intolerant  . Hypertension   . Ischemic cardiomyopathy 05/2016   EF 20-25% at the time of NSTEMI/CABG.  Repeat EF 07/2016 30-35%.  ACE-I and potassium stopped and entresto and aldactone started 09/11/16>>then hyperkalemia so aldactone d/c'd.  . Microscopic hematuria Fall 2016   CT nl except nonobstructing stones.  Cystoscopy normal 12/30/14 ([name])  . Nephrolithiasis    11 mm stone on R, 2 mm stone on L, ureters clear  . Non-STEMI (non-ST elevated myocardial infarction) ([place]) 05/2016   with impaired LV function  . 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  Urol ([name]) at [facility]; [name] referred him to [name] 03/2015: patient got bilat nerve sparing , robot assisted laparoscopic radical prostatectomy and pelvic lymphadenectomy.  As of 10/21/15 urol f/u, PSA undetectable (repeat 04/22/16 result pending as per urol f/u that day), pt chose close PSA surveillance over adjuvant radiation tx.  . Tobacco dependence    down to 1-2 cigs per day as of 11/2015.  Restarted 08/2016.  [name] Urinary retention 05/2015   occurred s/p foley removal    Past Surgical History:  Procedure Laterality Date  . aortic ultrasound  07/2012   NO AAA  . COLONOSCOPY  approx 2011 per pt   Done by surgeon in [place] after a bout of diverticulitis; normal per pt report--was told to repeat in 10 yrs.  . CORONARY ARTERY BYPASS GRAFT N/A 05/12/2016   Procedure: CORONARY ARTERY BYPASS GRAFTING (CABG) TIMES THREE USING LEFT INTERNAL MAMMARY ARTERY AND RIGHT SAPHENOUS LEG VEIN HARVESTED ENDOSCOPICALLY;  Surgeon: [name], MD;  Location: [facility];  Service: Open Heart Surgery;  Laterality: N/A;  . INGUINAL HERNIA REPAIR  2003 and  2004   Both sides.  [name] LEFT HEART CATH AND CORONARY ANGIOGRAPHY N/A 05/09/2016   Procedure: Left Heart Cath and Coronary Angiography;  Surgeon: [name], MD;  Location: [facility];  Service: Cardiovascular;  Laterality: N/A;  . LITHOTRIPSY  2004   Dr. [place] Pink in [place].  [name] LYMPHADENECTOMY Bilateral 04/30/2015   Procedure: PELVIC LYMPHADENECTOMY;  Surgeon: [name], MD;  Location: [facility];  Service: Urology;  Laterality: Bilateral;  . PROSTATE BIOPSY  02/05/15   Prostate adenocarcinoma: pt considering treatment options as of 02/19/15  . ROBOT ASSISTED LAPAROSCOPIC RADICAL PROSTATECTOMY N/A 04/30/2015   Procedure: XI ROBOTIC ASSISTED LAPAROSCOPIC RADICAL PROSTATECTOMY LEVEL 2;  Surgeon: [name], MD;  Location: [facility];  Service: Urology;  Laterality: N/A;  . TEE WITHOUT CARDIOVERSION N/A 05/12/2016   Procedure: TRANSESOPHAGEAL ECHOCARDIOGRAM (TEE);  Surgeon: [name], MD;  Location: [facility];  Service: Open Heart Surgery;  Laterality: N/A;  . TRANSTHORACIC ECHOCARDIOGRAM  03/22/2006; 05/2016; 07/2016   2008: EF 65%, normal valves, no  wall motion abnormalties, LA size normal.  05/2016 in context of non-STEMI, EF 20-25%, mild AV and MV regurg.  08/05/16: EF 30-35%, akinesis of mid lateral myoc, grd II DD, mild aortic regurg.    Current Medications: Outpatient Medications Prior to Visit  Medication Sig Dispense Refill  . ALPRAZolam (XANAX) 0.25 MG tablet TAKE ONE TABLET BY MOUTH AT BEDTIME AS NEEDED 30 tablet 5  . aspirin EC 325 MG EC tablet Take 1 tablet (325 mg total) by mouth daily. 30 tablet 0  . atorvastatin (LIPITOR) 80 MG tablet TAKE ONE TABLET BY MOUTH EVERY DAY AT 6PM 30 tablet 5  . carvedilol (COREG) 3.125 MG tablet TAKE ONE TABLET BY MOUTH TWICE DAILY WITH A MEAL 60 tablet 6  . furosemide (LASIX) 20 MG tablet Take '20mg'$  (1 tablet) as needed for swelling. 30 tablet 3  . Omega-3 Fatty Acids (FISH OIL) 1000 MG CAPS Take 1 capsule by mouth daily.    . sacubitril-valsartan  (ENTRESTO) 24-26 MG Take 1 tablet by mouth 2 (two) times daily. 60 tablet 5  . Vitamin D, Cholecalciferol, 400 units CAPS Take 400 Units by mouth daily.      No facility-administered medications prior to visit.      Allergies:   Amlodipine; Contrast media [iodinated diagnostic agents]; and Hydralazine   Social History   Social History  . Marital status: Married    Spouse name: N/A  . Number of children: N/A  . Years of education: N/A   Social History Main Topics  . Smoking status: Former Smoker    Packs/day: 1.00    Years: 55.00    Types: Cigarettes  . Smokeless tobacco: Never Used     Comment: STOPPED AT SURGERY  . Alcohol use No  . Drug use: No  . Sexual activity: Not Asked   Other Topics Concern  . None   Social History Narrative   Married, 2 grown children.   HS education.  Orig from [place], lives there now.   Occupation: maintenance.  Drives a tractor a lot, drives a pickup truck a lot, rides horses a lot.   Tobacco: 20 pack-yr hx (current as of 07/2012)   No drugs.   Alcohol: none     Family History:  [name] family history includes Cancer in his father; Cancer - Other (age of onset: 64) in his mother; Diabetes in his sister; Hypertension in his mother.   ROS General: Negative; No fevers, chills, or night sweats;  HEENT: Negative; No changes in vision or hearing, sinus congestion, difficulty swallowing Pulmonary: Positive for COPD; no recent wheezing, hemoptysis Cardiovascular: See history of present illness GI: Negative; No nausea, vomiting, diarrhea, or abdominal pain GU: History of prostate CVA Musculoskeletal: Negative; no myalgias, joint pain, or weakness Hematologic/Oncology: Negative; no easy bruising, bleeding Endocrine: Negative; no heat/cold intolerance; no diabetes Neuro: Negative; no changes in balance, headaches Skin: Negative; No rashes or skin lesions Psychiatric: Negative; No behavioral problems, depression Sleep: Negative; No  snoring, daytime sleepiness, hypersomnolence, bruxism, restless legs, hypnogognic hallucinations, no cataplexy Other comprehensive 14 point system review is negative.   PHYSICAL EXAM:   VS:  BP (!) 154/82   Pulse (!) 50   Ht '5\' 11"'$  (1.803 m)   Wt 168 lb 9.6 oz (76.5 kg)   BMI 23.51 kg/m     Repeat blood pressure by me was 130/76  Wt Readings from Last 3 Encounters:  11/17/16 168 lb 9.6 oz (76.5 kg)  09/27/16 165 lb (74.8 kg)  09/09/16  166 lb 12.8 oz (75.7 kg)    General: Alert, oriented, no distress.  Skin: normal turgor, no rashes, warm and dry HEENT: Normocephalic, atraumatic. Pupils equal round and reactive to light; sclera anicteric; extraocular muscles intact;  Nose without nasal septal hypertrophy Mouth/Parynx benign; Mallinpatti scale 3 Neck: No JVD, no carotid bruits; normal carotid upstroke Lungs: Decreased breath sounds without wheezing or rales  Chest wall: without tenderness to palpitation Heart: PMI not displaced, RRR, s1 s2 normal, 1/6 systolic murmur, no diastolic murmur, no rubs, gallops, thrills, or heaves Abdomen: soft, nontender; no hepatosplenomehaly, BS+; abdominal aorta nontender and not dilated by palpation. Back: no CVA tenderness Pulses 2+ Musculoskeletal: full range of motion, normal strength, no joint deformities Extremities: no clubbing cyanosis or edema, Homan's sign negative  Neurologic: grossly nonfocal; Cranial nerves grossly wnl Psychologic: Normal mood and affect    Studies/Labs Reviewed:   EKG:  EKG is ordered today.  ECG (independently read by me): Sinus bradycardia 50 bpm with PAC, PVC, and first-degree AV block with a PR interval of 216 ms.  August 2018 ECG (independently read by me): Sinus bradycardia 56 bpm with mild sinus arrhythmia and one isolated PVC with retrograde P wave.  Inferolateral ST changes.  Recent Labs: BMP Latest Ref Rng & Units 11/03/2016 09/23/2016 09/16/2016  Glucose 65 - 99 mg/dL 79 131(H) 97  BUN 8 - 27 mg/dL '12  15 18  '$ Creatinine 0.76 - 1.27 mg/dL 0.96 0.94 1.04  BUN/Creat Ratio 10 - '24 13 16 17  '$ Sodium 134 - 144 mmol/L 142 141 140  Potassium 3.5 - 5.2 mmol/L 4.2 4.7 5.6(H)  Chloride 96 - 106 mmol/L 104 103 104  CO2 20 - 29 mmol/L '24 26 24  '$ Calcium 8.6 - 10.2 mg/dL 9.4 9.5 10.0     Hepatic Function Latest Ref Rng & Units 09/16/2016 03/30/2016 07/20/2015  Total Protein 6.0 - 8.5 g/dL 6.5 6.8 6.9  Albumin 3.5 - 4.8 g/dL 4.3 4.4 4.2  AST 0 - 40 IU/L '20 25 15  '$ ALT 0 - 44 IU/L '11 16 12  '$ Alk Phosphatase 39 - 117 IU/L 86 68 80  Total Bilirubin 0.0 - 1.2 mg/dL 0.8 0.8 0.6    CBC Latest Ref Rng & Units 09/16/2016 05/20/2016 05/15/2016  WBC 3.4 - 10.8 x10E3/uL 5.0 8.7 8.4  Hemoglobin 13.0 - 17.7 g/dL 15.4 12.0(L) 11.0(L)  Hematocrit 37.5 - 51.0 % 45.6 36.1(L) 32.6(L)  Platelets 150 - 379 x10E3/uL 186 311.0 118(L)   Lab Results  Component Value Date   MCV 90 09/16/2016   MCV 94.7 05/20/2016   MCV 91.8 05/15/2016   Lab Results  Component Value Date   TSH 1.181 05/16/2016   No results found for: HGBA1C   BNP No results found for: BNP  ProBNP No results found for: PROBNP   Lipid Panel     Component Value Date/Time   CHOL 137 09/16/2016 0858   TRIG 68 09/16/2016 0858   HDL 35 (L) 09/16/2016 0858   CHOLHDL 3.9 09/16/2016 0858   CHOLHDL 5 03/30/2016 0851   VLDL 16.6 03/30/2016 0851   LDLCALC 88 09/16/2016 0858     RADIOLOGY: No results found.   Additional studies/ records that were reviewed today include:  I reviewed the patient's catheterization findings, operative note, discharge summary,  subsequent office visit and recent echo.    ASSESSMENT:    No diagnosis found.   PLAN:  Daniel [name] is a 71 year old male who has a history of tobacco abuse, COPD,  hypertension, prostate CA, hyperlipidemia, and GERD.  He suffered a NSTEMI leading to his urgent cardiac catheterization on 05/09/2016.  With  demonstration of severe multivessel CAD he ultimately underwent CABG  revascularization surgery.  He has an ischemic cardiomyopathy and his initial ejection fraction of 20-25% has improved slightly to 30-35%.  He has grade 2 diastolic dysfunction and aortic valve sclerosis with mild aortic insufficiency and mild left atrial dilatation.  I again reviewed his catheterization findings in detail as well as his most recent echo study.  When I last saw him, I felt he was an excellent candidate forARNI therapy, discontinued ACE inhibition, and greater than 36 hours later initiated Entresto at 24/26 [place] grams twice a day.  Ultimately, his spironolactone was discontinued due to potassium elevation.  I had also reduced and changed his Lasix as needed.  Initially, he did not titrate an attempt at dose increase of Entresto.  With fatigue and weakness.  His blood pressure today is stable and as result, I believe it is worth a trial to see if we can gradually titrate him back up to a 49/51 mg twice a day regimen.  As result, since he still has some of the samples left from before, for the next 2 weeks he will take a 49/51 mg dose in the morning and continue to take the 24/26 mg dose in the evening.  We will recheck blood work in 2 weeks.  If he is tolerating this and his labs are stable, we will then titrate to 49/51 mg to  twice a day.  If he does not tolerate the titration he will then continue at the 24/26 regimen.  Importance of continuation of complete smoking cessation was strongly.  His last blood work from 2 weeks ago showed stable creatinine at 0.96, potassium 4.2.  He continues to be on atorvastatin 80 mg for hyperlipidemia with target LDL less than 70.  He has sinus bradycardia on low-dose carvedilol at 3.125 mg twice a day.  He is not required any furosemide dosing since he has not had any recurrent edema.  He continues to take aspirin.  I will see him in 2 months for reevaluation.   Medication Adjustments/Labs and Tests Ordered: Current medicines are reviewed at length with  the patient today.  Concerns regarding medicines are outlined above.  Medication changes, Labs and Tests ordered today are listed in the Patient Instructions below. There are no Patient Instructions on file for this visit.   Signed, Shelva Majestic, MD  11/17/2016 9:35 AM    [facility] [address], [place], [place], [place]  [address] Phone: [phone]

## 2016-11-20 ENCOUNTER — Encounter: Payer: Self-pay | Admitting: Family Medicine

## 2016-11-20 ENCOUNTER — Emergency Department (HOSPITAL_COMMUNITY)
Admission: EM | Admit: 2016-11-20 | Discharge: 2016-11-21 | Disposition: A | Discharge status: Home / Self Care | Payer: Medicare Other | Attending: Emergency Medicine | Admitting: Emergency Medicine

## 2016-11-20 DIAGNOSIS — Z8546 Personal history of malignant neoplasm of prostate: Secondary | ICD-10-CM | POA: Insufficient documentation

## 2016-11-20 DIAGNOSIS — Z951 Presence of aortocoronary bypass graft: Secondary | ICD-10-CM | POA: Diagnosis not present

## 2016-11-20 DIAGNOSIS — J449 Chronic obstructive pulmonary disease, unspecified: Secondary | ICD-10-CM | POA: Insufficient documentation

## 2016-11-20 DIAGNOSIS — R21 Rash and other nonspecific skin eruption: Secondary | ICD-10-CM | POA: Insufficient documentation

## 2016-11-20 DIAGNOSIS — Z7982 Long term (current) use of aspirin: Secondary | ICD-10-CM | POA: Diagnosis not present

## 2016-11-20 DIAGNOSIS — Z79899 Other long term (current) drug therapy: Secondary | ICD-10-CM | POA: Insufficient documentation

## 2016-11-20 DIAGNOSIS — I2510 Atherosclerotic heart disease of native coronary artery without angina pectoris: Secondary | ICD-10-CM | POA: Diagnosis not present

## 2016-11-20 DIAGNOSIS — I10 Essential (primary) hypertension: Secondary | ICD-10-CM | POA: Diagnosis not present

## 2016-11-20 DIAGNOSIS — T7840XA Allergy, unspecified, initial encounter: Secondary | ICD-10-CM

## 2016-11-20 DIAGNOSIS — R9431 Abnormal electrocardiogram [ECG] [EKG]: Secondary | ICD-10-CM | POA: Diagnosis not present

## 2016-11-20 DIAGNOSIS — R404 Transient alteration of awareness: Secondary | ICD-10-CM | POA: Diagnosis not present

## 2016-11-20 DIAGNOSIS — Z87891 Personal history of nicotine dependence: Secondary | ICD-10-CM | POA: Diagnosis not present

## 2016-11-20 DIAGNOSIS — R531 Weakness: Secondary | ICD-10-CM | POA: Diagnosis not present

## 2016-11-20 MED ORDER — PREDNISONE 20 MG PO TABS
60.0000 mg | ORAL_TABLET | Freq: Once | ORAL | Status: AC
  Administered 2016-11-20: 60 mg via ORAL
  Filled 2016-11-20: qty 3

## 2016-11-20 MED ORDER — FAMOTIDINE 20 MG PO TABS
40.0000 mg | ORAL_TABLET | Freq: Once | ORAL | Status: AC
  Administered 2016-11-20: 40 mg via ORAL
  Filled 2016-11-20: qty 2

## 2016-11-20 NOTE — ED Notes (Signed)
ED Provider at bedside. 

## 2016-11-20 NOTE — ED Triage Notes (Signed)
Pt says that he started having hives under his arms around 1930, he took 2 benadryl for the same at that time. Pt has hives on lower extremities, abdomen, arms.  Pt says his blood pressure was running low at home and started having SOB and so he called EMS. Per medic report, pt c/o sob and vitals have been stable en route. Pt denies any new products, medications, or foods at home.

## 2016-11-20 NOTE — ED Provider Notes (Signed)
[facility] EMERGENCY DEPARTMENT Provider Note   CSN: [id number] Arrival date & time: 11/20/16  2146     History   Chief Complaint Chief Complaint  Patient presents with  . Urticaria    HPI Daniel [name]. is a 71 y.o. male.  71 yO M with a chief complaint of a diffuse itchy rash and shortness of breath and a feeling like he'll pass out. This started after he took his second dose of blood pressure medication this evening. Noted that he had hives all over his body that were itchy. He checked his blood pressure was in the 70s. He then called 911. Upon their arrival his blood pressure had normalized. He denies any other significant symptoms. Denies dark stool or blood in his stool denies decreased appetite.    The history is provided by the patient.  Urticaria  This is a recurrent problem. The current episode started less than 1 hour ago. The problem occurs constantly. The problem has been rapidly worsening. Associated symptoms include shortness of breath. Pertinent negatives include no chest pain, no abdominal pain and no headaches. Nothing aggravates the symptoms. Nothing relieves the symptoms. He has tried nothing for the symptoms. The treatment provided no relief.    Past Medical History:  Diagnosis Date  . Anxiety   . Atrial fibrillation ([place])    Limited episode, emergency room, June, 2013, spontaneous conversion to sinus rhythm, was evaluated By [name] 2013- no further visits.  Post-op (CABG) a-fib, converted on amio but pt self d/c'd this med due to DOE/side effect profile.  [name] BPH (benign prostatic hypertrophy) with urinary obstruction    Nocturia is his primary symptom.  Acute urinary retention occurred when he got CT with contrast fall 2016 ([name]).  [name] CAD (coronary artery disease) 05/2016   a. 05/2016: NSTEMI with cath showing 3-vessel disease. Underwent CABG on 05/12/16 with LIMA-D1, SVG-LAD, and SVG-PDA.   [name] COPD (chronic obstructive pulmonary  disease) ([place]) fall 2016   Bullous changes noted on lower lung images of CT abd done by urology  . Diverticulitis   . GERD (gastroesophageal reflux disease)   . Has received pneumococcal vaccination   . Hyperlipidemia    statin intolerant  . Hypertension   . Ischemic cardiomyopathy 05/2016   EF 20-25% at the time of NSTEMI/CABG.  Repeat EF 07/2016 30-35%.  ACE-I and potassium stopped and entresto and aldactone started 09/11/16>>then hyperkalemia so aldactone d/c'd.  . Microscopic hematuria Fall 2016   CT nl except nonobstructing stones.  Cystoscopy normal 12/30/14 ([name])  . Nephrolithiasis    11 mm stone on R, 2 mm stone on L, ureters clear  . Non-STEMI (non-ST elevated myocardial infarction) ([place]) 05/2016   with impaired LV function  . 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  Urol ([name]) at [facility]; [name] referred him to [name] 03/2015: patient got bilat nerve sparing , robot assisted laparoscopic radical prostatectomy and pelvic lymphadenectomy.  Urol f/u, PSA undetectable on repeat 04/22/16 and 10/28/16; pt chose PSA surveill over adjuv rad tx.   . Tobacco dependence    down to 1-2 cigs per day as of 11/2015.  Restarted 08/2016.  [name] Urinary retention 05/2015   occurred s/p foley removal    Patient Active Problem List   Diagnosis Date Noted  . Ischemic cardiomyopathy 10/13/2016  . CAD (coronary artery disease) 05/12/2016  . Non-ST elevation (NSTEMI) myocardial infarction ([place])   .  Prostate cancer ([name]) 04/30/2015  . UTI (urinary tract infection) 11/14/2014  . Hyperlipidemia LDL goal <70 10/30/2013  . Acute prostatitis 06/19/2013  . Uncontrolled hypertension 03/01/2013  . Tobacco dependence 03/01/2013  . Elevated PSA 03/01/2013  . BPH (benign prostatic hypertrophy) with urinary obstruction 08/30/2012  . GERD (gastroesophageal reflux disease) 08/30/2012  . HTN (hypertension), benign 08/30/2012  . Encounter to  establish care with new doctor 08/30/2012  . Tinnitus of both ears 08/30/2012  . Health maintenance examination 08/30/2012  . Palpable abdominal aorta 08/30/2012  . Postoperative atrial fibrillation ([place])   . Ejection fraction   . Hypertension   . Anxiety     Past Surgical History:  Procedure Laterality Date  . aortic ultrasound  07/2012   NO AAA  . COLONOSCOPY  approx 2011 per pt   Done by surgeon in [place] after a bout of diverticulitis; normal per pt report--was told to repeat in 10 yrs.  . CORONARY ARTERY BYPASS GRAFT N/A 05/12/2016   Procedure: CORONARY ARTERY BYPASS GRAFTING (CABG) TIMES THREE USING LEFT INTERNAL MAMMARY ARTERY AND RIGHT SAPHENOUS LEG VEIN HARVESTED ENDOSCOPICALLY;  Surgeon: [name], MD;  Location: [facility];  Service: Open Heart Surgery;  Laterality: N/A;  . INGUINAL HERNIA REPAIR  2003 and 2004   Both sides.  [name] LEFT HEART CATH AND CORONARY ANGIOGRAPHY N/A 05/09/2016   Procedure: Left Heart Cath and Coronary Angiography;  Surgeon: [name], MD;  Location: [facility];  Service: Cardiovascular;  Laterality: N/A;  . LITHOTRIPSY  2004   Dr. Maryland Pink in [place].  [name] LYMPHADENECTOMY Bilateral 04/30/2015   Procedure: PELVIC LYMPHADENECTOMY;  Surgeon: [name], MD;  Location: [facility];  Service: Urology;  Laterality: Bilateral;  . PROSTATE BIOPSY  02/05/15   Prostate adenocarcinoma: pt considering treatment options as of 02/19/15  . ROBOT ASSISTED LAPAROSCOPIC RADICAL PROSTATECTOMY N/A 04/30/2015   Procedure: XI ROBOTIC ASSISTED LAPAROSCOPIC RADICAL PROSTATECTOMY LEVEL 2;  Surgeon: [name], MD;  Location: [facility];  Service: Urology;  Laterality: N/A;  . TEE WITHOUT CARDIOVERSION N/A 05/12/2016   Procedure: TRANSESOPHAGEAL ECHOCARDIOGRAM (TEE);  Surgeon: [name], MD;  Location: [facility];  Service: Open Heart Surgery;  Laterality: N/A;  . TRANSTHORACIC ECHOCARDIOGRAM  03/22/2006; 05/2016; 07/2016   2008: EF 65%, normal valves, no wall motion  abnormalties, LA size normal.  05/2016 in context of non-STEMI, EF 20-25%, mild AV and MV regurg.  08/05/16: EF 30-35%, akinesis of mid lateral myoc, grd II DD, mild aortic regurg.       Home Medications    Prior to Admission medications   Medication Sig Start Date End Date Taking? Authorizing Provider  ALPRAZolam Duanne Moron) 0.25 MG tablet TAKE ONE TABLET BY MOUTH AT BEDTIME AS NEEDED 07/28/16  Yes McGowen, Adrian Blackwater, MD  aspirin EC 325 MG EC tablet Take 1 tablet (325 mg total) by mouth daily. 05/17/16  Yes Elgie Collard, PA-C  atorvastatin (LIPITOR) 80 MG tablet Take 80 mg by mouth every Monday, Wednesday, and Friday.   Yes [provider]  carvedilol (COREG) 3.125 MG tablet TAKE ONE TABLET BY MOUTH TWICE DAILY WITH A MEAL 06/16/16  Yes McGowen, Adrian Blackwater, MD  furosemide (LASIX) 20 MG tablet Take 20mg  (1 tablet) as needed for swelling. Patient taking differently: Take 20 mg by mouth daily as needed for fluid or edema.  09/09/16  Yes [name], MD  Omega-3 Fatty Acids (FISH OIL) 1000 MG CAPS Take 1 capsule by mouth daily.   Yes  [provider]  sacubitril-valsartan (ENTRESTO) 49-51 MG Take 1 tablet by mouth 2 (two) times daily. 11/17/16  Yes [name], MD  Vitamin D, Cholecalciferol, 400 units CAPS Take 400 Units by mouth daily.    Yes [provider]  atorvastatin (LIPITOR) 80 MG tablet TAKE ONE TABLET BY MOUTH EVERY DAY AT 6PM Patient not taking: Reported on 11/20/2016 08/11/16   [name], MD  predniSONE (DELTASONE) 20 MG tablet 2 tabs po daily x 4 days 11/21/16   Deno Etienne, DO    Family History Family History  Problem Relation Age of Onset  . Hypertension Mother   . Cancer - Other Mother 42       breast  . Cancer Father        Lung ca age 58  . Diabetes Sister     Social History Social History  Substance Use Topics  . Smoking status: Former Smoker    Packs/day: 1.00    Years: 55.00    Types: Cigarettes  . Smokeless tobacco: Never Used       Comment: STOPPED AT SURGERY  . Alcohol use No     Allergies   Amlodipine; Contrast media [iodinated diagnostic agents]; and Hydralazine   Review of Systems Review of Systems  Constitutional: Negative for chills and fever.  HENT: Negative for congestion and facial swelling.   Eyes: Negative for discharge and visual disturbance.  Respiratory: Positive for shortness of breath.   Cardiovascular: Negative for chest pain and palpitations.  Gastrointestinal: Negative for abdominal pain, diarrhea and vomiting.  Musculoskeletal: Negative for arthralgias and myalgias.  Skin: Positive for rash. Negative for color change.  Neurological: Negative for tremors, syncope and headaches.  Psychiatric/Behavioral: Negative for confusion and dysphoric mood.     Physical Exam Updated Vital Signs BP (!) 141/93   Pulse (!) 58   Temp (!) 97.4 F (36.3 C) (Oral)   Resp 17   SpO2 100%   Physical Exam  Constitutional: He is oriented to person, place, and time. He appears well-developed and well-nourished.  HENT:  Head: Normocephalic and atraumatic.  Eyes: Pupils are equal, round, and reactive to light. EOM are normal.  Neck: Normal range of motion. Neck supple. No JVD present.  Cardiovascular: Normal rate and regular rhythm.  Exam reveals no gallop and no friction rub.   No murmur heard. Pulmonary/Chest: No respiratory distress. He has no wheezes.  Abdominal: He exhibits no distension and no mass. There is no tenderness. There is no rebound and no guarding.  Musculoskeletal: Normal range of motion.  Neurological: He is alert and oriented to person, place, and time.  Skin: Rash noted. No pallor.  Psychiatric: He has a normal mood and affect. His behavior is normal.  Nursing note and vitals reviewed.    ED Treatments / Results  Labs (all labs ordered are listed, but only abnormal results are displayed) Labs Reviewed - No data to display  EKG  EKG Interpretation  Date/Time:  Sunday  November 20 2016 21:54:30 EDT Ventricular Rate:  73 PR Interval:    QRS Duration: 121 QT Interval:  396 QTC Calculation: 437 R Axis:   88 Text Interpretation:  Sinus arrhythmia Ventricular premature complex Nonspecific intraventricular conduction delay Nonspecific T abnormalities, inferior leads No significant change since last tracing Confirmed by Deno Etienne [phone]) on 11/20/2016 10:25:32 PM       Radiology No results found.  Procedures Procedures (including critical care time)  Medications Ordered in ED Medications  predniSONE (DELTASONE)  tablet 60 mg (60 mg Oral Given 11/20/16 2213)  famotidine (PEPCID) tablet 40 mg (40 mg Oral Given 11/20/16 2213)     Initial Impression / Assessment and Plan / ED Course  I have reviewed the triage vital signs and the nursing notes.  Pertinent labs & imaging results that were available during my care of the patient were reviewed by me and considered in my medical decision making (see chart for details).     71 yo M with a chief complaint of a diffuse itchy rash. Appears to be hives on my exam. Patient with no other symptoms on arrival. He had some transient event that was concerning for possible anaphylaxis to be atypical for it to resolve spontaneously. I think more likely had a near syncopal episode. Is completely back to baseline. Denies any other significant issues. Was given prednisone with continued improvement of his rash. Requesting discharge home.  12:31 AM:  I have discussed the diagnosis/risks/treatment options with the patient and family and believe the pt to be eligible for discharge home to follow-up with PCP. We also discussed returning to the ED immediately if new or worsening sx occur. We discussed the sx which are most concerning (e.g., sudden worsening pain, fever, inability to tolerate by mouth) that necessitate immediate return. Medications administered to the patient during their visit and any new prescriptions provided to  the patient are listed below.  Medications given during this visit Medications  predniSONE (DELTASONE) tablet 60 mg (60 mg Oral Given 11/20/16 2213)  famotidine (PEPCID) tablet 40 mg (40 mg Oral Given 11/20/16 2213)     The patient appears reasonably screen and/or stabilized for discharge and I doubt any other medical condition or other [facility] requiring further screening, evaluation, or treatment in the ED at this time prior to discharge.    Final Clinical Impressions(s) / ED Diagnoses   Final diagnoses:  Allergic reaction, initial encounter    New Prescriptions Discharge Medication List as of 11/21/2016 12:13 AM    START taking these medications   Details  predniSONE (DELTASONE) 20 MG tablet 2 tabs po daily x 4 days, Print         Deno Etienne, DO 11/21/16 0031

## 2016-11-21 MED ORDER — PREDNISONE 20 MG PO TABS: ORAL_TABLET | ORAL | 0 refills | Status: DC

## 2016-11-21 NOTE — Discharge Instructions (Signed)
Take Benadryl as needed for itching. Return for sudden worsening symptoms. Do not take your blood pressure medication until you discuss this with your primary care physician. They may want to refer you to an allergist.

## 2016-11-22 ENCOUNTER — Telehealth: Payer: Self-pay | Admitting: Cardiovascular Disease

## 2016-11-22 NOTE — Telephone Encounter (Signed)
Returned call to patient he stated he called EMS this past Sunday and was taken to [facility] ED.Stated after he took pm dose of Entresto 24/26 mg he broke out in hives,Sob, B/P 77/44 pulse 80,he almost passed out.ED Dr told him to stop taking Entresto.Appointment scheduled with Almyra Deforest PA 11/24/16 at 2:30 pm.

## 2016-11-22 NOTE — Telephone Encounter (Signed)
New Message     Pt c/o medication issue:  1. Name of Medication: Entresto   2. How are you currently taking this medication (dosage and times per day)? 49.56 in the morning   3. Are you having a reaction (difficulty breathing--STAT)? yes  4. What is your medication issue? Went to the emergency room , he broke out in hives and sob , bp went 189/90  Then it dropped to 77/44 passed out twice before the emt got there

## 2016-11-24 ENCOUNTER — Encounter: Payer: Self-pay | Admitting: Physician Assistant

## 2016-11-24 ENCOUNTER — Ambulatory Visit (INDEPENDENT_AMBULATORY_CARE_PROVIDER_SITE_OTHER): Payer: Medicare Other | Admitting: Physician Assistant

## 2016-11-24 VITALS — BP 189/88 | HR 64 | Ht 71.0 in | Wt 172.0 lb

## 2016-11-24 DIAGNOSIS — I25810 Atherosclerosis of coronary artery bypass graft(s) without angina pectoris: Secondary | ICD-10-CM

## 2016-11-24 DIAGNOSIS — I10 Essential (primary) hypertension: Secondary | ICD-10-CM

## 2016-11-24 DIAGNOSIS — I480 Paroxysmal atrial fibrillation: Secondary | ICD-10-CM | POA: Diagnosis not present

## 2016-11-24 DIAGNOSIS — I255 Ischemic cardiomyopathy: Secondary | ICD-10-CM | POA: Diagnosis not present

## 2016-11-24 DIAGNOSIS — E785 Hyperlipidemia, unspecified: Secondary | ICD-10-CM | POA: Diagnosis not present

## 2016-11-24 NOTE — Progress Notes (Signed)
Cardiology Office Note    Date:  11/26/2016   ID:  Daniel [name]., DOB 11-Mar-1945, MRN [id number]  PCP:  [name], MD  Cardiologist:  Dr. Claiborne Billings   Chief Complaint  Patient presents with  . Follow-up    post hospital, pt had bad reaction to new dose of Entresto    History of Present Illness:  Daniel [name]. is a 71 y.o. male with PMH of atrial fibrillation, CAD s/p CABG LIMA to D1, SVG to LAD, SVG to PDA on 05/12/2016, HLD intolerant to statin, HTN, ICM with baseline EF 30-35%, and h/o prostate CA. His last cardiac catheterization on 05/09/2016 showed EF 20-25%, akinesis of the distal inferoapical segment with significant hypokinesis globally consistent with ischemic cardiomyopathy, multivessel disease with 70% followed by total occlusion of large LAD with distal collateral arteries, 50% proximal left circumflex stenosis with total occlusion of distal left circumflex, total occlusion of mid RCA with distal collateral artery. Bypass surgery was recommended and he eventually underwent successful CABG 3 by Dr. Roxan Hockey on 05/12/2016 with LIMA to diagonal, SVG to LAD, SVG to PDA. He developed postoperative atrial fibrillation and was treated with IV amiodarone with conversion to sinus rhythm prior to discharge. He had a follow-up echocardiogram on 08/05/2016 which revealed LV EF remains low at 30-35% however slightly improved from his previous 20-25%.  He was on carvedilol for heart failure. His previous ARB was discontinued and switched to [facility]. His potassium supplement was discontinued. A 2 week basic metabolic panel showed elevated potassium level, spironolactone was also discontinued. He was temporarily titrated to 49/51 mg Entresto, however he went back to the previous dose due to weakness. He was last seen by Dr. Claiborne Billings last week on 11/17/2016, at which time he was continued on the low dose Entresto 24/26 mg. 2 month follow-up was recommended. Unfortunately he went to the hospital  on 11/20/2016 with shortness of breath, diffuse itchy rash and presyncope. His blood pressure was in the 70s by home blood pressure cuff. He called 911, upon EMS arrival his blood pressure has normalized. He was given prednisone for his rash.  He presents today along with his wife, he is convinced that it is Delene Loll that caused his recent rash. He is still taking carvedilol. His blood pressure is initially elevated today, however decreased on manual recheck. I will continue him on the carvedilol for now and bring him back in 2 weeks. Once he recover, I plan to add additional blood pressure medication. Otherwise he denies any chest pain, lower extremity edema, orthopnea or PND.   Past Medical History:  Diagnosis Date  . Anxiety   . Atrial fibrillation ([place])    Limited episode, emergency room, June, 2013, spontaneous conversion to sinus rhythm, was evaluated By [name] 2013- no further visits.  Post-op (CABG) a-fib, converted on amio but pt self d/c'd this med due to DOE/side effect profile.  [name] BPH (benign prostatic hypertrophy) with urinary obstruction    Nocturia is his primary symptom.  Acute urinary retention occurred when he got CT with contrast fall 2016 ([name]).  [name] CAD (coronary artery disease) 05/2016   a. 05/2016: NSTEMI with cath showing 3-vessel disease. Underwent CABG on 05/12/16 with LIMA-D1, SVG-LAD, and SVG-PDA.   [name] COPD (chronic obstructive pulmonary disease) ([place]) fall 2016   Bullous changes noted on lower lung images of CT abd done by urology  . Diverticulitis   . GERD (gastroesophageal reflux disease)   . Has received  pneumococcal vaccination   . Hyperlipidemia    statin intolerant  . Hypertension   . Ischemic cardiomyopathy 05/2016   EF 20-25% at the time of NSTEMI/CABG.  Repeat EF 07/2016 30-35%.  ACE-I and potassium stopped and entresto and aldactone started 09/11/16>>then hyperkalemia so aldactone d/c'd.  . Microscopic hematuria Fall 2016   CT nl except nonobstructing  stones.  Cystoscopy normal 12/30/14 ([name])  . Nephrolithiasis    11 mm stone on R, 2 mm stone on L, ureters clear  . Non-STEMI (non-ST elevated myocardial infarction) ([place]) 05/2016   with impaired LV function  . 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  Urol ([name]) at [facility]; [name] referred him to [name] 03/2015: patient got bilat nerve sparing , robot assisted laparoscopic radical prostatectomy and pelvic lymphadenectomy.  Urol f/u, PSA undetectable on repeat 04/22/16 and 10/28/16; pt chose PSA surveill over adjuv rad tx.   . Tobacco dependence    down to 1-2 cigs per day as of 11/2015.  Restarted 08/2016.  [name] Urinary retention 05/2015   occurred s/p foley removal    Past Surgical History:  Procedure Laterality Date  . aortic ultrasound  07/2012   NO AAA  . COLONOSCOPY  approx 2011 per pt   Done by surgeon in [place] after a bout of diverticulitis; normal per pt report--was told to repeat in 10 yrs.  . CORONARY ARTERY BYPASS GRAFT N/A 05/12/2016   Procedure: CORONARY ARTERY BYPASS GRAFTING (CABG) TIMES THREE USING LEFT INTERNAL MAMMARY ARTERY AND RIGHT SAPHENOUS LEG VEIN HARVESTED ENDOSCOPICALLY;  Surgeon: [name], MD;  Location: [place];  Service: Open Heart Surgery;  Laterality: N/A;  . INGUINAL HERNIA REPAIR  2003 and 2004   Both sides.  [name] LEFT HEART CATH AND CORONARY ANGIOGRAPHY N/A 05/09/2016   Procedure: Left Heart Cath and Coronary Angiography;  Surgeon: [name], MD;  Location: [place] [facility];  Service: Cardiovascular;  Laterality: N/A;  . LITHOTRIPSY  2004   Dr. Maryland Pink in [place].  [name] LYMPHADENECTOMY Bilateral 04/30/2015   Procedure: PELVIC LYMPHADENECTOMY;  Surgeon: [name], MD;  Location[facility];  Service: Urology;  Laterality: Bilateral;  . PROSTATE BIOPSY  02/05/15   Prostate adenocarcinoma: pt considering treatment options as of 02/19/15  . ROBOT ASSISTED LAPAROSCOPIC RADICAL PROSTATECTOMY  N/A 04/30/2015   Procedure: XI ROBOTIC ASSISTED LAPAROSCOPIC RADICAL PROSTATECTOMY LEVEL 2;  Surgeon: [name], MD;  [facility];  Service: Urology;  Laterality: N/A;  . TEE WITHOUT CARDIOVERSION N/A 05/12/2016   Procedure: TRANSESOPHAGEAL ECHOCARDIOGRAM (TEE);  Surgeon: [name], MD;  Location: [place];  Service: Open Heart Surgery;  Laterality: N/A;  . TRANSTHORACIC ECHOCARDIOGRAM  03/22/2006; 05/2016; 07/2016   2008: EF 65%, normal valves, no wall motion abnormalties, LA size normal.  05/2016 in context of non-STEMI, EF 20-25%, mild AV and MV regurg.  08/05/16: EF 30-35%, akinesis of mid lateral myoc, grd II DD, mild aortic regurg.    Current Medications: Outpatient Medications Prior to Visit  Medication Sig Dispense Refill  . ALPRAZolam (XANAX) 0.25 MG tablet TAKE ONE TABLET BY MOUTH AT BEDTIME AS NEEDED 30 tablet 5  . aspirin EC 325 MG EC tablet Take 1 tablet (325 mg total) by mouth daily. 30 tablet 0  . atorvastatin (LIPITOR) 80 MG tablet Take 80 mg by mouth every Monday, Wednesday, and Friday.    . carvedilol (COREG) 3.125 MG tablet TAKE ONE TABLET BY MOUTH  TWICE DAILY WITH A MEAL 60 tablet 6  . furosemide (LASIX) 20 MG tablet Take 20mg  (1 tablet) as needed for swelling. (Patient taking differently: Take 20 mg by mouth daily as needed for fluid or edema. ) 30 tablet 3  . Omega-3 Fatty Acids (FISH OIL) 1000 MG CAPS Take 1 capsule by mouth daily.    . predniSONE (DELTASONE) 20 MG tablet 2 tabs po daily x 4 days 8 tablet 0  . Vitamin D, Cholecalciferol, 400 units CAPS Take 400 Units by mouth daily.     [name] atorvastatin (LIPITOR) 80 MG tablet TAKE ONE TABLET BY MOUTH EVERY DAY AT 6PM (Patient not taking: Reported on 11/20/2016) 30 tablet 5  . sacubitril-valsartan (ENTRESTO) 49-51 MG Take 1 tablet by mouth 2 (two) times daily. (Patient not taking: Reported on 11/24/2016) 28 tablet 0   No facility-administered medications prior to visit.      Allergies:   Entresto  [sacubitril-valsartan]; Amlodipine; Contrast media [iodinated diagnostic agents]; and Hydralazine   Social History   Social History  . Marital status: Married    Spouse name: N/A  . Number of children: N/A  . Years of education: N/A   Social History Main Topics  . Smoking status: Former Smoker    Packs/day: 1.00    Years: 55.00    Types: Cigarettes  . Smokeless tobacco: Never Used     Comment: STOPPED AT SURGERY  . Alcohol use No  . Drug use: No  . Sexual activity: Not Asked   Other Topics Concern  . None   Social History Narrative   Married, 2 grown children.   HS education.  Orig from [place], lives there now.   Occupation: maintenance.  Drives a tractor a lot, drives a pickup truck a lot, rides horses a lot.   Tobacco: 20 pack-yr hx (current as of 07/2012)   No drugs.   Alcohol: none     Family History:  [name] family history includes Cancer in his father; Cancer - Other (age of onset: 79) in his mother; Diabetes in his sister; Hypertension in his mother.   ROS:   Please see the history of present illness.    ROS All other systems reviewed and are negative.   PHYSICAL EXAM:   VS:  BP (!) 189/88 (BP Location: Left Arm, Patient Position: Sitting)   Pulse 64   Ht 5\' 11"  (1.803 m)   Wt 172 lb (78 kg)   BMI 23.99 kg/m    GEN: Well nourished, well developed, in no acute distress  HEENT: normal  Neck: no JVD, carotid bruits, or masses Cardiac: RRR; no murmurs, rubs, or gallops,no edema  Respiratory:  clear to auscultation bilaterally, normal work of breathing GI: soft, nontender, nondistended, + BS MS: no deformity or atrophy  Skin: warm and dry, no rash Neuro:  Alert and Oriented x 3, Strength and sensation are intact Psych: euthymic mood, full affect  Wt Readings from Last 3 Encounters:  11/24/16 172 lb (78 kg)  11/17/16 168 lb 9.6 oz (76.5 kg)  09/27/16 165 lb (74.8 kg)      Studies/Labs Reviewed:   EKG:  EKG is not ordered today.   Recent  Labs: 05/16/2016: Magnesium 1.7; TSH 1.181 09/16/2016: ALT 11; Hemoglobin 15.4; Platelets 186 11/03/2016: BUN 12; Creatinine, Ser 0.96; Potassium 4.2; Sodium 142   Lipid Panel    Component Value Date/Time   CHOL 137 09/16/2016 0858   TRIG 68 09/16/2016 0858   HDL 35 (L) 09/16/2016 1610  CHOLHDL 3.9 09/16/2016 0858   CHOLHDL 5 03/30/2016 0851   VLDL 16.6 03/30/2016 0851   LDLCALC 88 09/16/2016 0858    Additional studies/ records that were reviewed today include:   Cath 05/09/2016 Conclusion     Mid RCA lesion, 100 %stenosed.  Dist Cx lesion, 100 %stenosed.  Ost Cx to Prox Cx lesion, 50 %stenosed.  LM lesion, 30 %stenosed.  Prox LAD to Mid LAD lesion, 100 %stenosed.  The left ventricular ejection fraction is less than 25% by visual estimate.  LV end diastolic pressure is normal.  There is severe left ventricular systolic dysfunction.   Severe LV dysfunction with an ejection fraction of 20-25%. There was akinesis of the distal inferoapical segment with significant hypokinesis globally c/w an ischemic cardiomyopathy.  Severe multivessel CAD with 30% near ostial stenosis, 75% proximal, and total occlusion of a large LAD with distal collateralization; 50% proximal left circumflex stenoses with total occlusion of the distal circumflex; and chronic total occlusion of the mid RCA with collateralization distally via the RV marginal branch as well as via the left coronary injection.  RECOMMENDATION: Surgical consultation for CABG revascularization.       Echo 08/05/2016 LV EF: 30% -   35%  Study Conclusions  - Left ventricle: The cavity size was normal. Wall thickness was   increased in a pattern of mild LVH. Systolic function was   moderately to severely reduced. The estimated ejection fraction   was in the range of 30% to 35%. Akinesis of the midlateral   myocardium. Features are consistent with a pseudonormal left   ventricular filling pattern, with concomitant  abnormal relaxation   and increased filling pressure (grade 2 diastolic dysfunction). - Aortic valve: Mildly calcified annulus. There was mild   regurgitation. - Left atrium: The atrium was mildly dilated.   ASSESSMENT:    1. Coronary artery disease involving coronary bypass graft of native heart without angina pectoris   2. Essential hypertension   3. Hyperlipidemia, unspecified hyperlipidemia type   4. Ischemic cardiomyopathy   5. PAF (paroxysmal atrial fibrillation) (HCC)      PLAN:  In order of problems listed above:  1. CAD s/p CABG: Denies any chest pain, continue aspirin, Lipitor, carvedilol.  2. Ischemic cardiomyopathy: Baseline EF 30-35%. Continue carvedilol, his benazepril was recently switched to [facility], however he recently went to the hospital with possible allergic reaction. He is convinced it is the [facility] that is causing the issue. He denies any swelling of facial area  3. Hypertension: Blood pressure initially elevated, improved on recheck. I will bring the patient back in 2 weeks, if his blood pressure remain high, I plan to restart her benazepril.  4. Hyperlipidemia: Last lipid panel obtained 2 months ago showed cholesterol 137, triglycerides 69, HDL 35, LDL 88. Continue Lipitor 3 times weekly.  5. PAF: Postoperative A. fib occurred in the setting of bypass surgery, converted to normal sinus rhythm on IV amiodarone prior to discharge.    Medication Adjustments/Labs and Tests Ordered: Current medicines are reviewed at length with the patient today.  Concerns regarding medicines are outlined above.  Medication changes, Labs and Tests ordered today are listed in the Patient Instructions below. Patient Instructions  Medication Instructions: STOP Entresto    Follow-Up: Your physician recommends that you schedule a follow-up appointment in: 2 weeks with Almyra Deforest, PA. Please bring your home Blood Pressure Cuff with you to this appointment.  If you need a  refill on your cardiac medications before your next  appointment, please call your pharmacy.     Hilbert Corrigan, [place]  11/26/2016 2:01 PM    [facility] [place], [place], [place]  [address] Phone: [phone]; Fax: [phone]

## 2016-11-24 NOTE — Patient Instructions (Signed)
Medication Instructions: STOP Entresto    Follow-Up: Your physician recommends that you schedule a follow-up appointment in: 2 weeks with Almyra Deforest, PA. Please bring your home Blood Pressure Cuff with you to this appointment.  If you need a refill on your cardiac medications before your next appointment, please call your pharmacy.

## 2016-11-26 ENCOUNTER — Encounter: Payer: Self-pay | Admitting: Physician Assistant

## 2016-11-29 ENCOUNTER — Ambulatory Visit (INDEPENDENT_AMBULATORY_CARE_PROVIDER_SITE_OTHER): Payer: Medicare Other | Admitting: Family Medicine

## 2016-11-29 ENCOUNTER — Encounter: Payer: Self-pay | Admitting: Family Medicine

## 2016-11-29 VITALS — BP 198/101 | HR 57 | Temp 97.5°F | Resp 16 | Ht 71.0 in | Wt 172.2 lb

## 2016-11-29 DIAGNOSIS — I10 Essential (primary) hypertension: Secondary | ICD-10-CM | POA: Diagnosis not present

## 2016-11-29 MED ORDER — BENAZEPRIL HCL 40 MG PO TABS: 40.0000 mg | ORAL_TABLET | Freq: Every day | ORAL | 3 refills | Status: DC

## 2016-11-29 NOTE — Progress Notes (Signed)
OFFICE VISIT  11/29/2016   CC:  Chief Complaint  Patient presents with  . Follow-up    HTN   HPI:    Patient is a 71 y.o. Caucasian male who presents for f/u HTN. Recent ED visit for SOB, hives---presumed allergic rxn, which patient is convinced came from entresto that was recently started for his ischemic cardiomyopathy.  He says this happened when he initially was started on entresto in recent past as well. Entresto d/c'd. He was on benazapril prior to being switched to [facility].  Saw cardiology for f/u 11/24/16 and BP was up and plan was to stay off entresto and return in 2 wks.  Says he was fatigued on entresto, but now that he has been off of it he feels back to baseline.  Home bp over the last 5 yrs: 170-180 over 80-90.    Past Medical History:  Diagnosis Date  . Anxiety   . Atrial fibrillation ([place])    Limited episode, emergency room, June, 2013, spontaneous conversion to sinus rhythm, was evaluated By [name] 2013- no further visits.  Post-op (CABG) a-fib, converted on amio but pt self d/c'd this med due to DOE/side effect profile.  [name] BPH (benign prostatic hypertrophy) with urinary obstruction    Nocturia is his primary symptom.  Acute urinary retention occurred when he got CT with contrast fall 2016 ([name]).  [name] CAD (coronary artery disease) 05/2016   a. 05/2016: NSTEMI with cath showing 3-vessel disease. Underwent CABG on 05/12/16 with LIMA-D1, SVG-LAD, and SVG-PDA.   [name] COPD (chronic obstructive pulmonary disease) ([place]) fall 2016   Bullous changes noted on lower lung images of CT abd done by urology  . Diverticulitis   . GERD (gastroesophageal reflux disease)   . Has received pneumococcal vaccination   . Hyperlipidemia    statin intolerant  . Hypertension   . Ischemic cardiomyopathy 05/2016   EF 20-25% at the time of NSTEMI/CABG.  Repeat EF 07/2016 30-35%.  ACE-I and potassium stopped and entresto and aldactone started 09/11/16>>then hyperkalemia so aldactone d/c'd.   . Microscopic hematuria Fall 2016   CT nl except nonobstructing stones.  Cystoscopy normal 12/30/14 ([name])  . Nephrolithiasis    11 mm stone on R, 2 mm stone on L, ureters clear  . Non-STEMI (non-ST elevated myocardial infarction) ([place]) 05/2016   with impaired LV function  . 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  Urol ([name]) at [facility]; [name] referred him to [name] 03/2015: patient got bilat nerve sparing , robot assisted laparoscopic radical prostatectomy and pelvic lymphadenectomy.  Urol f/u, PSA undetectable on repeat 04/22/16 and 10/28/16; pt chose PSA surveill over adjuv rad tx.   . Tobacco dependence    down to 1-2 cigs per day as of 11/2015.  Restarted 08/2016.  [name] Urinary retention 05/2015   occurred s/p foley removal    Past Surgical History:  Procedure Laterality Date  . aortic ultrasound  07/2012   NO AAA  . COLONOSCOPY  approx 2011 per pt   Done by surgeon in [place] after a bout of diverticulitis; normal per pt report--was told to repeat in 10 yrs.  . CORONARY ARTERY BYPASS GRAFT N/A 05/12/2016   Procedure: CORONARY ARTERY BYPASS GRAFTING (CABG) TIMES THREE USING LEFT INTERNAL MAMMARY ARTERY AND RIGHT SAPHENOUS LEG VEIN HARVESTED ENDOSCOPICALLY;  Surgeon: [name], MD;  Location: [place];  Service: Open Heart Surgery;  Laterality: N/A;  .  INGUINAL HERNIA REPAIR  2003 and 2004   Both sides.  [name] LEFT HEART CATH AND CORONARY ANGIOGRAPHY N/A 05/09/2016   Procedure: Left Heart Cath and Coronary Angiography;  Surgeon: [name], MD;  Location: [place] [facility];  Service: Cardiovascular;  Laterality: N/A;  . LITHOTRIPSY  2004   Dr. Maryland Pink in [place].  [name] LYMPHADENECTOMY Bilateral 04/30/2015   Procedure: PELVIC LYMPHADENECTOMY;  Surgeon: [name], MD;  [facility];  Service: Urology;  Laterality: Bilateral;  . PROSTATE BIOPSY  02/05/15   Prostate adenocarcinoma: pt considering treatment options as  of 02/19/15  . ROBOT ASSISTED LAPAROSCOPIC RADICAL PROSTATECTOMY N/A 04/30/2015   Procedure: XI ROBOTIC ASSISTED LAPAROSCOPIC RADICAL PROSTATECTOMY LEVEL 2;  Surgeon: [name], MD;  [facility];  Service: Urology;  Laterality: N/A;  . TEE WITHOUT CARDIOVERSION N/A 05/12/2016   Procedure: TRANSESOPHAGEAL ECHOCARDIOGRAM (TEE);  Surgeon: [name], MD;  Location: [place];  Service: Open Heart Surgery;  Laterality: N/A;  . TRANSTHORACIC ECHOCARDIOGRAM  03/22/2006; 05/2016; 07/2016   2008: EF 65%, normal valves, no wall motion abnormalties, LA size normal.  05/2016 in context of non-STEMI, EF 20-25%, mild AV and MV regurg.  08/05/16: EF 30-35%, akinesis of mid lateral myoc, grd II DD, mild aortic regurg.    Outpatient Medications Prior to Visit  Medication Sig Dispense Refill  . ALPRAZolam (XANAX) 0.25 MG tablet TAKE ONE TABLET BY MOUTH AT BEDTIME AS NEEDED 30 tablet 5  . aspirin EC 325 MG EC tablet Take 1 tablet (325 mg total) by mouth daily. 30 tablet 0  . atorvastatin (LIPITOR) 80 MG tablet Take 80 mg by mouth every Monday, Wednesday, and Friday.    . carvedilol (COREG) 3.125 MG tablet TAKE ONE TABLET BY MOUTH TWICE DAILY WITH A MEAL 60 tablet 6  . furosemide (LASIX) 20 MG tablet Take 20mg  (1 tablet) as needed for swelling. (Patient taking differently: Take 20 mg by mouth daily as needed for fluid or edema. ) 30 tablet 3  . Omega-3 Fatty Acids (FISH OIL) 1000 MG CAPS Take 1 capsule by mouth daily.    . Vitamin D, Cholecalciferol, 400 units CAPS Take 400 Units by mouth daily.     . predniSONE (DELTASONE) 20 MG tablet 2 tabs po daily x 4 days (Patient not taking: Reported on 11/29/2016) 8 tablet 0   No facility-administered medications prior to visit.     Allergies  Allergen Reactions  . Entresto [Sacubitril-Valsartan] Hives and Shortness Of Breath  . Amlodipine Swelling    LE swelling  . Contrast Media [Iodinated Diagnostic Agents] Other (See Comments)    Prostate problem had to  wear a catheter for two weeks after having dye.   [name] Hydralazine Palpitations and Other (See Comments)    Fatigue+    ROS As per HPI  PE: Blood pressure (!) 198/101, pulse (!) 57, temperature (!) 97.5 F (36.4 C), temperature source Oral, resp. rate 16, height 5\' 11"  (1.803 m), weight 172 lb 4 oz (78.1 kg), SpO2 100 %. Gen: Alert, well appearing.  Patient is oriented to person, place, time, and situation. AFFECT: pleasant, lucid thought and speech. CV: RRR, no m/r/g.   LUNGS: CTA bilat, nonlabored resps, good aeration in all lung fields. EXT: no clubbing, cyanosis, or edema.    LABS:    Chemistry      Component Value Date/Time   NA 142 11/03/2016 0919   K 4.2 11/03/2016 0919   CL 104 11/03/2016 0919   CO2 24 11/03/2016  0919   BUN 12 11/03/2016 0919   CREATININE 0.96 11/03/2016 0919      Component Value Date/Time   CALCIUM 9.4 11/03/2016 0919   ALKPHOS 86 09/16/2016 0858   AST 20 09/16/2016 0858   ALT 11 09/16/2016 0858   BILITOT 0.8 09/16/2016 0858       IMPRESSION AND PLAN:  1) HTN, uncontrolled. Entresto--allergic reaction. He had no problems taking benazepril 40mg  qd prior to the switch to entresto, so we'll switch him back to this med (benazepril) at 40mg  qd. Continue to monitor bp at home daily. Has f/u with cardiology in 1 week.  An After Visit Summary was printed and given to the patient.  FOLLOW UP: Return for keep appt set for 12/28/16 with me.  Signed:  Crissie Sickles, MD           11/29/2016

## 2016-12-08 ENCOUNTER — Ambulatory Visit: Payer: Medicare Other | Admitting: Physician Assistant

## 2016-12-08 ENCOUNTER — Encounter: Payer: Self-pay | Admitting: Physician Assistant

## 2016-12-08 VITALS — BP 182/105 | HR 53 | Ht 71.0 in | Wt 170.8 lb

## 2016-12-08 DIAGNOSIS — I255 Ischemic cardiomyopathy: Secondary | ICD-10-CM | POA: Diagnosis not present

## 2016-12-08 DIAGNOSIS — E785 Hyperlipidemia, unspecified: Secondary | ICD-10-CM | POA: Diagnosis not present

## 2016-12-08 DIAGNOSIS — I10 Essential (primary) hypertension: Secondary | ICD-10-CM | POA: Diagnosis not present

## 2016-12-08 DIAGNOSIS — I25810 Atherosclerosis of coronary artery bypass graft(s) without angina pectoris: Secondary | ICD-10-CM

## 2016-12-08 DIAGNOSIS — I480 Paroxysmal atrial fibrillation: Secondary | ICD-10-CM

## 2016-12-08 MED ORDER — BENAZEPRIL HCL 40 MG PO TABS: 40.0000 mg | ORAL_TABLET | Freq: Two times a day (BID) | ORAL | 3 refills | Status: DC

## 2016-12-08 NOTE — Patient Instructions (Signed)
Medication Instructions:  INCREASE Benazpril 40 mg Take one tablet twice a day   Labwork: Your physician recommends that you return for lab work in: 1 week on or around 12-14-2016 BMET  Testing/Procedures: None   Follow-Up: Your physician recommends that you schedule a follow-up appointment in: 2-3 months with Dr Claiborne Billings  Any Other Special Instructions Will Be Listed Below (If Applicable). Daniel [name] will speak with Dr Claiborne Billings and Dr Percival Spanish about you switching providers to make sure both physicians are on board with the switch, then a scheduler will contact you to reschedule your appointment in the [place] or [place] office   If you need a refill on your cardiac medications before your next appointment, please call your pharmacy.

## 2016-12-08 NOTE — Progress Notes (Signed)
Cardiology Office Note    Date:  12/08/2016   ID:  Daniel [name]., DOB 04/15/1945, MRN [id number]  PCP:  [name], MD  Cardiologist:  Dr. Claiborne Billings   Chief Complaint  Patient presents with  . Follow-up    seen for Dr. Claiborne Billings, but patient wish to be established at [facility] due to close proximity to home    History of Present Illness:  Daniel [name]. is a 71 y.o. male with PMH of atrial fibrillation, CAD s/p CABG LIMA to D1, SVG to LAD, SVG to PDA on 05/12/2016, HLD intolerant to statin, HTN, ICM with baseline EF 30-35%, and h/o prostate CA. His last cardiac catheterization on 05/09/2016 showed EF 20-25%, akinesis of the distal inferoapical segment with significant hypokinesis globally consistent with ischemic cardiomyopathy, multivessel disease with 70% followed by total occlusion of large LAD with distal collateral arteries, 50% proximal left circumflex stenosis with total occlusion of distal left circumflex, total occlusion of mid RCA with distal collateral artery. Bypass surgery was recommended and he eventually underwent successful CABG 3 by Dr. Roxan Hockey on 05/12/2016 with LIMA to diagonal, SVG to LAD, SVG to PDA. He developed postoperative atrial fibrillation and was treated with IV amiodarone with conversion to sinus rhythm prior to discharge. He had a follow-up echocardiogram on 08/05/2016 which revealed LV EF remains low at 30-35% however slightly improved from his previous 20-25%.  He was placed on carvedilol and Entresto, spironolactone was discontinued due to elevated potassium level. Unfortunately, he went back to the hospital on 11/20/2016 with shortness breath, diffuse EEG rash and presyncope. His blood pressure as in the 70s by home blood pressure cuff. He was given prednisone for his rash. I last saw the patient on 11/24/2016, he was convinced that it was Daniel [name] that caused his recent rash. He was still taking carvedilol as this time. Since the last time I saw him, he has  been restarted on benazepril 40 mg daily by his primary care provider. His blood pressure remains elevated, I will change benazepril to 40 mg twice a day. He will need a one-week base metabolic panel at his PCPs office. Patient also wished to be established in our [place] office due to close proximity to where he lives.   Past Medical History:  Diagnosis Date  . Anxiety   . Atrial fibrillation ([place])    Limited episode, emergency room, June, 2013, spontaneous conversion to sinus rhythm, was evaluated By [name] 2013- no further visits.  Post-op (CABG) a-fib, converted on amio but pt self d/c'd this med due to DOE/side effect profile.  [name] BPH (benign prostatic hypertrophy) with urinary obstruction    Nocturia is his primary symptom.  Acute urinary retention occurred when he got CT with contrast fall 2016 ([name]).  [name] CAD (coronary artery disease) 05/2016   a. 05/2016: NSTEMI with cath showing 3-vessel disease. Underwent CABG on 05/12/16 with LIMA-D1, SVG-LAD, and SVG-PDA.   [name] COPD (chronic obstructive pulmonary disease) ([place]) fall 2016   Bullous changes noted on lower lung images of CT abd done by urology  . Diverticulitis   . GERD (gastroesophageal reflux disease)   . Has received pneumococcal vaccination   . Hyperlipidemia    statin intolerant  . Hypertension   . Ischemic cardiomyopathy 05/2016   EF 20-25% at the time of NSTEMI/CABG.  Repeat EF 07/2016 30-35%.  ACE-I and potassium stopped and entresto and aldactone started 09/11/16>>then hyperkalemia so aldactone d/c'd.  . Microscopic hematuria Fall 2016  CT nl except nonobstructing stones.  Cystoscopy normal 12/30/14 ([name])  . Nephrolithiasis    11 mm stone on R, 2 mm stone on L, ureters clear  . Non-STEMI (non-ST elevated myocardial infarction) ([place]) 05/2016   with impaired LV function  . 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  Urol ([name]) at [facility]; [name] referred  him to [name] 03/2015: patient got bilat nerve sparing , robot assisted laparoscopic radical prostatectomy and pelvic lymphadenectomy.  Urol f/u, PSA undetectable on repeat 04/22/16 and 10/28/16; pt chose PSA surveill over adjuv rad tx.   . Tobacco dependence    down to 1-2 cigs per day as of 11/2015.  Restarted 08/2016.  [name] Urinary retention 05/2015   occurred s/p foley removal    Past Surgical History:  Procedure Laterality Date  . aortic ultrasound  07/2012   NO AAA  . COLONOSCOPY  approx 2011 per pt   Done by surgeon in [place] after a bout of diverticulitis; normal per pt report--was told to repeat in 10 yrs.  . INGUINAL HERNIA REPAIR  2003 and 2004   Both sides.  [name] LITHOTRIPSY  2004   Dr. Maryland Pink in [place].  [name] PROSTATE BIOPSY  02/05/15   Prostate adenocarcinoma: pt considering treatment options as of 02/19/15  . TRANSTHORACIC ECHOCARDIOGRAM  03/22/2006; 05/2016; 07/2016   2008: EF 65%, normal valves, no wall motion abnormalties, LA size normal.  05/2016 in context of non-STEMI, EF 20-25%, mild AV and MV regurg.  08/05/16: EF 30-35%, akinesis of mid lateral myoc, grd II DD, mild aortic regurg.    Current Medications: Outpatient Medications Prior to Visit  Medication Sig Dispense Refill  . ALPRAZolam (XANAX) 0.25 MG tablet TAKE ONE TABLET BY MOUTH AT BEDTIME AS NEEDED 30 tablet 5  . aspirin EC 325 MG EC tablet Take 1 tablet (325 mg total) by mouth daily. 30 tablet 0  . atorvastatin (LIPITOR) 80 MG tablet Take 80 mg by mouth every Monday, Wednesday, and Friday.    . carvedilol (COREG) 3.125 MG tablet TAKE ONE TABLET BY MOUTH TWICE DAILY WITH A MEAL 60 tablet 6  . furosemide (LASIX) 20 MG tablet Take 20mg  (1 tablet) as needed for swelling. (Patient taking differently: Take 20 mg by mouth daily as needed for fluid or edema. ) 30 tablet 3  . Omega-3 Fatty Acids (FISH OIL) 1000 MG CAPS Take 1 capsule by mouth daily.    . Vitamin D, Cholecalciferol, 400 units CAPS Take 400 Units by mouth daily.     .  benazepril (LOTENSIN) 40 MG tablet Take 1 tablet (40 mg total) by mouth daily. 30 tablet 3   No facility-administered medications prior to visit.      Allergies:   Entresto [sacubitril-valsartan]; Amlodipine; Contrast media [iodinated diagnostic agents]; and Hydralazine   Social History   Socioeconomic History  . Marital status: Married    Spouse name: None  . Number of children: None  . Years of education: None  . Highest education level: None  Social Needs  . Financial resource strain: None  . Food insecurity - worry: None  . Food insecurity - inability: None  . Transportation needs - medical: None  . Transportation needs - non-medical: None  Occupational History  . None  Tobacco Use  . Smoking status: Former Smoker    Packs/day: 1.00    Years: 55.00    Pack years: 55.00    Types: Cigarettes  .  Smokeless tobacco: Never Used  . Tobacco comment: STOPPED AT SURGERY  Substance and Sexual Activity  . Alcohol use: No  . Drug use: No  . Sexual activity: None  Other Topics Concern  . None  Social History Narrative   Married, 2 grown children.   HS education.  Orig from [place], lives there now.   Occupation: maintenance.  Drives a tractor a lot, drives a pickup truck a lot, rides horses a lot.   Tobacco: 20 pack-yr hx (current as of 07/2012)   No drugs.   Alcohol: none     Family History:  [name] family history includes Cancer in his father; Cancer - Other (age of onset: 44) in his mother; Diabetes in his sister; Hypertension in his mother.   ROS:   Please see the history of present illness.    ROS All other systems reviewed and are negative.   PHYSICAL EXAM:   VS:  BP (!) 182/105   Pulse (!) 53   Ht 5\' 11"  (1.803 m)   Wt 170 lb 12.8 oz (77.5 kg)   SpO2 99%   BMI 23.82 kg/m    GEN: Well nourished, well developed, in no acute distress  HEENT: normal  Neck: no JVD, carotid bruits, or masses Cardiac: RRR; no murmurs, rubs, or gallops,no edema    Respiratory:  clear to auscultation bilaterally, normal work of breathing GI: soft, nontender, nondistended, + BS MS: no deformity or atrophy  Skin: warm and dry, no rash Neuro:  Alert and Oriented x 3, Strength and sensation are intact Psych: euthymic mood, full affect  Wt Readings from Last 3 Encounters:  12/08/16 170 lb 12.8 oz (77.5 kg)  11/29/16 172 lb 4 oz (78.1 kg)  11/24/16 172 lb (78 kg)      Studies/Labs Reviewed:   EKG:  EKG is not ordered today.    Recent Labs: 05/16/2016: Magnesium 1.7; TSH 1.181 09/16/2016: ALT 11; Hemoglobin 15.4; Platelets 186 11/03/2016: BUN 12; Creatinine, Ser 0.96; Potassium 4.2; Sodium 142   Lipid Panel    Component Value Date/Time   CHOL 137 09/16/2016 0858   TRIG 68 09/16/2016 0858   HDL 35 (L) 09/16/2016 0858   CHOLHDL 3.9 09/16/2016 0858   CHOLHDL 5 03/30/2016 0851   VLDL 16.6 03/30/2016 0851   LDLCALC 88 09/16/2016 0858    Additional studies/ records that were reviewed today include:   Cath 05/09/2016 Conclusion     Mid RCA lesion, 100 %stenosed.  Dist Cx lesion, 100 %stenosed.  Ost Cx to Prox Cx lesion, 50 %stenosed.  LM lesion, 30 %stenosed.  Prox LAD to Mid LAD lesion, 100 %stenosed.  The left ventricular ejection fraction is less than 25% by visual estimate.  LV end diastolic pressure is normal.  There is severe left ventricular systolic dysfunction.  Severe LV dysfunction with an ejection fraction of 20-25%. There was akinesis of the distal inferoapical segment with significant hypokinesis globally c/w an ischemic cardiomyopathy.  Severe multivessel CAD with 30% near ostial stenosis, 75% proximal, and total occlusion of a large LAD with distal collateralization; 50% proximal left circumflex stenoses with total occlusion of the distal circumflex; and chronic total occlusion of the mid RCA with collateralization distally via the RV marginal branch as well as via the left coronary  injection.  RECOMMENDATION: Surgical consultation for CABG revascularization.       Echo 08/05/2016 LV EF: 30% - 35%  Study Conclusions  - Left ventricle: The cavity size was normal. Wall thickness was  increased in a pattern of mild LVH. Systolic function was moderately to severely reduced. The estimated ejection fraction was in the range of 30% to 35%. Akinesis of the midlateral myocardium. Features are consistent with a pseudonormal left ventricular filling pattern, with concomitant abnormal relaxation and increased filling pressure (grade 2 diastolic dysfunction). - Aortic valve: Mildly calcified annulus. There was mild regurgitation. - Left atrium: The atrium was mildly dilated.    ASSESSMENT:    1. Coronary artery disease involving coronary bypass graft of native heart without angina pectoris   2. Ischemic cardiomyopathy   3. Hyperlipidemia, unspecified hyperlipidemia type   4. PAF (paroxysmal atrial fibrillation) ([place])   5. Essential hypertension      PLAN:  In order of problems listed above:  1. CAD s/p CABG: continue aspirin, Lipitor and carvedilol.  2. Ischemic cardiomyopathy: Baseline EF 30-35%. On carvedilol and benazepril. He was previously placed on Entresto, this was discontinued after he developed significant rash with hypotension  3. Hypertension: blood pressure remaining elevated, increase benazepril to 40 mg twice a day. Obtain basic metabolic panel in one week at PCPs office.  4. Hyperlipidemia: Continue Lipitor 3 time weekly, last lipid panel showed cholesterol 137, triglycerides 69, HDL 35, LDL 88  5. PAF; Postoperative atrial fibrillaton occurred in the setting of bypass surgery. No need for systemic anticoagulation at this point unless recurrence    Medication Adjustments/Labs and Tests Ordered: Current medicines are reviewed at length with the patient today.  Concerns regarding medicines are outlined above.  Medication  changes, Labs and Tests ordered today are listed in the Patient Instructions below. Patient Instructions  Medication Instructions:  INCREASE Benazpril 40 mg Take one tablet twice a day   Labwork: Your physician recommends that you return for lab work in: 1 week on or around 12-14-2016 BMET  Testing/Procedures: None   Follow-Up: Your physician recommends that you schedule a follow-up appointment in: 2-3 months with Dr Claiborne Billings  Any Other Special Instructions Will Be Listed Below (If Applicable). Isaac Laud will speak with Dr Claiborne Billings and Dr Percival Spanish about you switching providers to make sure both physicians are on board with the switch, then a scheduler will contact you to reschedule your appointment in the [place] or [place] office   If you need a refill on your cardiac medications before your next appointment, please call your pharmacy.     Hilbert Corrigan, [place]  12/08/2016 10:51 PM    [facility] [address], [place], [place]  [address] Phone: [phone]; Fax: [phone]

## 2016-12-14 DIAGNOSIS — I255 Ischemic cardiomyopathy: Secondary | ICD-10-CM | POA: Diagnosis not present

## 2016-12-14 LAB — BASIC METABOLIC PANEL
BUN/Creatinine Ratio: 14 (ref 10–24)
BUN: 15 mg/dL (ref 8–27)
CALCIUM: 9.5 mg/dL (ref 8.6–10.2)
CHLORIDE: 103 mmol/L (ref 96–106)
CO2: 26 mmol/L (ref 20–29)
Creatinine, Ser: 1.06 mg/dL (ref 0.76–1.27)
GFR calc non Af Amer: 70 mL/min/{1.73_m2} (ref >59)
GFR, EST AFRICAN AMERICAN: 81 mL/min/{1.73_m2} (ref >59)
GLUCOSE: 98 mg/dL (ref 65–99)
POTASSIUM: 5.1 mmol/L (ref 3.5–5.2)
Sodium: 142 mmol/L (ref 134–144)

## 2016-12-19 NOTE — Progress Notes (Signed)
Kidney function and electrolyte stable on current medication

## 2016-12-28 ENCOUNTER — Encounter: Payer: Self-pay | Admitting: Family Medicine

## 2016-12-28 ENCOUNTER — Ambulatory Visit: Payer: Medicare Other | Admitting: Family Medicine

## 2016-12-28 ENCOUNTER — Other Ambulatory Visit: Payer: Self-pay

## 2016-12-28 VITALS — BP 187/83 | HR 59 | Temp 98.0°F | Resp 16 | Ht 71.0 in | Wt 173.5 lb

## 2016-12-28 DIAGNOSIS — I10 Essential (primary) hypertension: Secondary | ICD-10-CM

## 2016-12-28 DIAGNOSIS — K5792 Diverticulitis of intestine, part unspecified, without perforation or abscess without bleeding: Secondary | ICD-10-CM | POA: Diagnosis not present

## 2016-12-28 MED ORDER — CIPROFLOXACIN HCL 500 MG PO TABS: ORAL_TABLET | ORAL | 0 refills | Status: DC

## 2016-12-28 MED ORDER — HYDROCHLOROTHIAZIDE 25 MG PO TABS: 25.0000 mg | ORAL_TABLET | Freq: Every day | ORAL | 0 refills | Status: DC

## 2016-12-28 MED ORDER — METRONIDAZOLE 500 MG PO TABS: 500.0000 mg | ORAL_TABLET | Freq: Three times a day (TID) | ORAL | 0 refills | Status: AC

## 2016-12-28 NOTE — Progress Notes (Signed)
OFFICE VISIT  12/28/2016   CC:  Chief Complaint  Patient presents with  . Follow-up    RCI, pt is not fasting.     HPI:    Patient is a 71 y.o. Caucasian male who presents for f/u HTN, hyperlipidemia, COPD. Has CAD and is s/p CABG, has ischemic CM--all managed by cardiology--stable at most recent cardiology f/u 12/08/16.  Most recent issue has been bp control.  Benazepril recently increased to 40 mg BID by cardiologist, with recheck of BMET 1 week later showing normal lytes, stable kidney function. Home BPs: 170s/85-95, HR usually 55-60. No HAs.  Gets SOB with moderate exertion--this is his baseline.  No palpitations, no vision c/o, no focal weakness, no LE swelling.  He has not been having to take his lasix at all.  Also c/o diffuse soreness across lower abdomen for about 1 week.  No n/v, no fever, no diarrhea or blood in stool. Minimal improvement since onset.  Past Medical History:  Diagnosis Date  . Anxiety   . Atrial fibrillation ([place])    Limited episode, emergency room, June, 2013, spontaneous conversion to sinus rhythm, was evaluated By [name] 2013- no further visits.  Post-op (CABG) a-fib, converted on amio but pt self d/c'd this med due to DOE/side effect profile.  [name] BPH (benign prostatic hypertrophy) with urinary obstruction    Nocturia is his primary symptom.  Acute urinary retention occurred when he got CT with contrast fall 2016 ([name]).  [name] CAD (coronary artery disease) 05/2016   a. 05/2016: NSTEMI with cath showing 3-vessel disease. Underwent CABG on 05/12/16 with LIMA-D1, SVG-LAD, and SVG-PDA.   [name] COPD (chronic obstructive pulmonary disease) ([place]) fall 2016   Bullous changes noted on lower lung images of CT abd done by urology  . Diverticulitis   . GERD (gastroesophageal reflux disease)   . Has received pneumococcal vaccination   . Hyperlipidemia    statin intolerant  . Hypertension   . Ischemic cardiomyopathy 05/2016   EF 20-25% at the time of  NSTEMI/CABG.  Repeat EF 07/2016 30-35%.  ACE-I and potassium stopped and entresto and aldactone started 09/11/16>>then hyperkalemia so aldactone d/c'd.  . Microscopic hematuria Fall 2016   CT nl except nonobstructing stones.  Cystoscopy normal 12/30/14 ([name])  . Nephrolithiasis    11 mm stone on R, 2 mm stone on L, ureters clear  . Non-STEMI (non-ST elevated myocardial infarction) ([place]) 05/2016   with impaired LV function  . 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  Urol ([name]) at [facility]; [name] referred him to [name] 03/2015: patient got bilat nerve sparing , robot assisted laparoscopic radical prostatectomy and pelvic lymphadenectomy.  Urol f/u, PSA undetectable on repeat 04/22/16 and 10/28/16; pt chose PSA surveill over adjuv rad tx.   . Tobacco dependence    down to 1-2 cigs per day as of 11/2015.  Restarted 08/2016.  [name] Urinary retention 05/2015   occurred s/p foley removal    Past Surgical History:  Procedure Laterality Date  . aortic ultrasound  07/2012   NO AAA  . COLONOSCOPY  approx 2011 per pt   Done by surgeon in [place] after a bout of diverticulitis; normal per pt report--was told to repeat in 10 yrs.  . CORONARY ARTERY BYPASS GRAFT N/A 05/12/2016   Procedure: CORONARY ARTERY BYPASS GRAFTING (CABG) TIMES THREE USING LEFT INTERNAL MAMMARY ARTERY AND RIGHT SAPHENOUS LEG VEIN HARVESTED ENDOSCOPICALLY;  Surgeon: Remo Lipps  Chaya Jan, MD;  Location: [place] [facility];  Service: Open Heart Surgery;  Laterality: N/A;  . INGUINAL HERNIA REPAIR  2003 and 2004   Both sides.  [name] LEFT HEART CATH AND CORONARY ANGIOGRAPHY N/A 05/09/2016   Procedure: Left Heart Cath and Coronary Angiography;  Surgeon: [name], MD;  Location: [place] [facility];  Service: Cardiovascular;  Laterality: N/A;  . LITHOTRIPSY  2004   Dr. Maryland Pink in [place].  [name] LYMPHADENECTOMY Bilateral 04/30/2015   Procedure: PELVIC LYMPHADENECTOMY;  Surgeon: [name], MD;   Location: [facility];  Service: Urology;  Laterality: Bilateral;  . PROSTATE BIOPSY  02/05/15   Prostate adenocarcinoma: pt considering treatment options as of 02/19/15  . ROBOT ASSISTED LAPAROSCOPIC RADICAL PROSTATECTOMY N/A 04/30/2015   Procedure: XI ROBOTIC ASSISTED LAPAROSCOPIC RADICAL PROSTATECTOMY LEVEL 2;  Surgeon: [name], MD;  Location: [facility];  Service: Urology;  Laterality: N/A;  . TEE WITHOUT CARDIOVERSION N/A 05/12/2016   Procedure: TRANSESOPHAGEAL ECHOCARDIOGRAM (TEE);  Surgeon: [name], MD;  Location: [facility];  Service: Open Heart Surgery;  Laterality: N/A;  . TRANSTHORACIC ECHOCARDIOGRAM  03/22/2006; 05/2016; 07/2016   2008: EF 65%, normal valves, no wall motion abnormalties, LA size normal.  05/2016 in context of non-STEMI, EF 20-25%, mild AV and MV regurg.  08/05/16: EF 30-35%, akinesis of mid lateral myoc, grd II DD, mild aortic regurg.    Outpatient Medications Prior to Visit  Medication Sig Dispense Refill  . ALPRAZolam (XANAX) 0.25 MG tablet TAKE ONE TABLET BY MOUTH AT BEDTIME AS NEEDED 30 tablet 5  . aspirin EC 325 MG EC tablet Take 1 tablet (325 mg total) by mouth daily. 30 tablet 0  . atorvastatin (LIPITOR) 80 MG tablet Take 80 mg by mouth every Monday, Wednesday, and Friday.    . benazepril (LOTENSIN) 40 MG tablet Take 1 tablet (40 mg total) 2 (two) times daily by mouth. 60 tablet 3  . carvedilol (COREG) 3.125 MG tablet TAKE ONE TABLET BY MOUTH TWICE DAILY WITH A MEAL 60 tablet 6  . furosemide (LASIX) 20 MG tablet Take 20mg  (1 tablet) as needed for swelling. (Patient taking differently: Take 20 mg by mouth daily as needed for fluid or edema. ) 30 tablet 3  . Omega-3 Fatty Acids (FISH OIL) 1000 MG CAPS Take 1 capsule by mouth daily.    . Vitamin D, Cholecalciferol, 400 units CAPS Take 400 Units by mouth daily.      No facility-administered medications prior to visit.     Allergies  Allergen Reactions  . Entresto [Sacubitril-Valsartan] Hives and Shortness Of  Breath  . Amlodipine Swelling    LE swelling  . Contrast Media [Iodinated Diagnostic Agents] Other (See Comments)    Prostate problem had to wear a catheter for two weeks after having dye.   [name] Hydralazine Palpitations and Other (See Comments)    Fatigue+    ROS As per HPI  PE: Blood pressure (!) 187/83, pulse (!) 59, temperature 98 F (36.7 C), temperature source Oral, resp. rate 16, height 5\' 11"  (1.803 m), weight 173 lb 8 oz (78.7 kg), SpO2 98 %. Gen: Alert, well appearing.  Patient is oriented to person, place, time, and situation. AFFECT: pleasant, lucid thought and speech. CV: RRR, no m/r/g.   LUNGS: CTA bilat, nonlabored resps, good aeration in all lung fields. ABD: soft, Non-distended.  BS normal.  Diffuse lower abd TTP --mild.  L>R. No guarding or rebound. EXT: no clubbing, cyanosis, or edema.    LABS:  Lab  Results  Component Value Date   TSH 1.181 05/16/2016   Lab Results  Component Value Date   WBC 5.0 09/16/2016   HGB 15.4 09/16/2016   HCT 45.6 09/16/2016   MCV 90 09/16/2016   PLT 186 09/16/2016   Lab Results  Component Value Date   CREATININE 1.06 12/14/2016   BUN 15 12/14/2016   NA 142 12/14/2016   K 5.1 12/14/2016   CL 103 12/14/2016   CO2 26 12/14/2016   Lab Results  Component Value Date   ALT 11 09/16/2016   AST 20 09/16/2016   ALKPHOS 86 09/16/2016   BILITOT 0.8 09/16/2016   Lab Results  Component Value Date   CHOL 137 09/16/2016   Lab Results  Component Value Date   HDL 35 (L) 09/16/2016   Lab Results  Component Value Date   LDLCALC 88 09/16/2016   Lab Results  Component Value Date   TRIG 68 09/16/2016   Lab Results  Component Value Date   CHOLHDL 3.9 09/16/2016   Lab Results  Component Value Date   PSA <0.02 10/15/2015   PSA 17.82 (H) 12/22/2014   PSA 13.92 (H) 11/14/2014    IMPRESSION AND PLAN:  1) Uncontrolled HTN: Maxed out on benazepril, HR prevents further titration of coreg. Limited at this time as to what  antihypertensive we can add due to multiple med intolerances. Add HCTZ 25mg  qd. No labs needed today. Repeat BMET at f/u in 2 weeks.  2) Acute diverticulitis: 10 day course of cipro and flagyl rx'd. Signs/symptoms to call or return for were reviewed and pt expressed understanding.   An After Visit Summary was printed and given to the patient.  FOLLOW UP: Return in about 2 weeks (around 01/11/2017) for f/u uncontrolled HTN/BMET.  Signed:  Crissie Sickles, MD           12/28/2016

## 2017-01-11 ENCOUNTER — Encounter: Payer: Self-pay | Admitting: Family Medicine

## 2017-01-11 ENCOUNTER — Ambulatory Visit: Payer: Medicare Other | Admitting: Family Medicine

## 2017-01-11 ENCOUNTER — Other Ambulatory Visit: Payer: Self-pay

## 2017-01-11 VITALS — BP 165/91 | HR 58 | Temp 97.6°F | Resp 16 | Ht 71.0 in | Wt 166.0 lb

## 2017-01-11 DIAGNOSIS — I10 Essential (primary) hypertension: Secondary | ICD-10-CM

## 2017-01-11 LAB — BASIC METABOLIC PANEL
BUN: 21 mg/dL (ref 6–23)
CALCIUM: 9.7 mg/dL (ref 8.4–10.5)
CO2: 30 meq/L (ref 19–32)
CREATININE: 0.98 mg/dL (ref 0.40–1.50)
Chloride: 99 mEq/L (ref 96–112)
GFR: 79.98 mL/min (ref >60.00)
Glucose, Bld: 102 mg/dL — ABNORMAL HIGH (ref 70–99)
Potassium: 4.5 mEq/L (ref 3.5–5.1)
Sodium: 136 mEq/L (ref 135–145)

## 2017-01-11 MED ORDER — HYDROCHLOROTHIAZIDE 25 MG PO TABS: 25.0000 mg | ORAL_TABLET | Freq: Every day | ORAL | 1 refills | Status: DC

## 2017-01-11 NOTE — Progress Notes (Signed)
OFFICE VISIT  01/11/2017   CC:  Chief Complaint  Patient presents with  . Follow-up    HTN, pt is fasting.     HPI:    Patient is a 71 y.o. Caucasian male who presents for f/u uncontrolled HTN. Last visit I added hctz 25mg  qd b/c home bp's still 160-170s/80s-90s.  HR 50s.  Also, at last visit he was having mild lower abd pain that I treated as a mild case of acute diverticulitis with flagyl and cipro x 10d.  HTN: home bps 120-130s/60s-80s.  Did not take bp med this morning yet. He feels much improved as well, has more energy, feels he can do more activity w/out having to sit and rest and catch his breath.  He has not had to take lasix any since last visit.  Takes this RARELY as it is. No adverse side effects from hctz.  His abdominal discomfort is completely gone now.   Past Medical History:  Diagnosis Date  . Anxiety   . Atrial fibrillation ([place])    Limited episode, emergency room, June, 2013, spontaneous conversion to sinus rhythm, was evaluated By [name] 2013- no further visits.  Post-op (CABG) a-fib, converted on amio but pt self d/c'd this med due to DOE/side effect profile.  [name] BPH (benign prostatic hypertrophy) with urinary obstruction    Nocturia is his primary symptom.  Acute urinary retention occurred when he got CT with contrast fall 2016 ([name]).  [name] CAD (coronary artery disease) 05/2016   a. 05/2016: NSTEMI with cath showing 3-vessel disease. Underwent CABG on 05/12/16 with LIMA-D1, SVG-LAD, and SVG-PDA.   [name] COPD (chronic obstructive pulmonary disease) ([place]) fall 2016   Bullous changes noted on lower lung images of CT abd done by urology  . Diverticulitis   . GERD (gastroesophageal reflux disease)   . Has received pneumococcal vaccination   . Hyperlipidemia    statin intolerant  . Hypertension   . Ischemic cardiomyopathy 05/2016   EF 20-25% at the time of NSTEMI/CABG.  Repeat EF 07/2016 30-35%.  ACE-I and potassium stopped and entresto and aldactone started  09/11/16>>then hyperkalemia so aldactone d/c'd.  . Microscopic hematuria Fall 2016   CT nl except nonobstructing stones.  Cystoscopy normal 12/30/14 ([name])  . Nephrolithiasis    11 mm stone on R, 2 mm stone on L, ureters clear  . Non-STEMI (non-ST elevated myocardial infarction) ([place]) 05/2016   with impaired LV function  . 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  Urol ([name]) at [facility]; [name] referred him to [name] 03/2015: patient got bilat nerve sparing , robot assisted laparoscopic radical prostatectomy and pelvic lymphadenectomy.  Urol f/u, PSA undetectable on repeat 04/22/16 and 10/28/16; pt chose PSA surveill over adjuv rad tx.   . Tobacco dependence    down to 1-2 cigs per day as of 11/2015.  Restarted 08/2016.  [name] Urinary retention 05/2015   occurred s/p foley removal    Past Surgical History:  Procedure Laterality Date  . aortic ultrasound  07/2012   NO AAA  . COLONOSCOPY  approx 2011 per pt   Done by surgeon in [place] after a bout of diverticulitis; normal per pt report--was told to repeat in 10 yrs.  . CORONARY ARTERY BYPASS GRAFT N/A 05/12/2016   Procedure: CORONARY ARTERY BYPASS GRAFTING (CABG) TIMES THREE USING LEFT INTERNAL MAMMARY ARTERY AND RIGHT SAPHENOUS LEG VEIN HARVESTED ENDOSCOPICALLY;  Surgeon: [name], MD;  Location: [facility];  Service: Open Heart Surgery;  Laterality: N/A;  . INGUINAL HERNIA REPAIR  2003 and 2004   Both sides.  [name] LEFT HEART CATH AND CORONARY ANGIOGRAPHY N/A 05/09/2016   Procedure: Left Heart Cath and Coronary Angiography;  Surgeon: [name], MD;  Location: [facility] CV LAB;  Service: Cardiovascular;  Laterality: N/A;  . LITHOTRIPSY  2004   Dr. Maryland Pink in [place].  [name] LYMPHADENECTOMY Bilateral 04/30/2015   Procedure: PELVIC LYMPHADENECTOMY;  Surgeon: [name], MD;  Location: [facility];  Service: Urology;  Laterality: Bilateral;  . PROSTATE BIOPSY  02/05/15   Prostate  adenocarcinoma: pt considering treatment options as of 02/19/15  . ROBOT ASSISTED LAPAROSCOPIC RADICAL PROSTATECTOMY N/A 04/30/2015   Procedure: XI ROBOTIC ASSISTED LAPAROSCOPIC RADICAL PROSTATECTOMY LEVEL 2;  Surgeon: [name], MD;  Location: [facility];  Service: Urology;  Laterality: N/A;  . TEE WITHOUT CARDIOVERSION N/A 05/12/2016   Procedure: TRANSESOPHAGEAL ECHOCARDIOGRAM (TEE);  Surgeon: [name], MD;  Location: [facility];  Service: Open Heart Surgery;  Laterality: N/A;  . TRANSTHORACIC ECHOCARDIOGRAM  03/22/2006; 05/2016; 07/2016   2008: EF 65%, normal valves, no wall motion abnormalties, LA size normal.  05/2016 in context of non-STEMI, EF 20-25%, mild AV and MV regurg.  08/05/16: EF 30-35%, akinesis of mid lateral myoc, grd II DD, mild aortic regurg.    Outpatient Medications Prior to Visit  Medication Sig Dispense Refill  . ALPRAZolam (XANAX) 0.25 MG tablet TAKE ONE TABLET BY MOUTH AT BEDTIME AS NEEDED 30 tablet 5  . aspirin EC 325 MG EC tablet Take 1 tablet (325 mg total) by mouth daily. 30 tablet 0  . atorvastatin (LIPITOR) 80 MG tablet Take 80 mg by mouth every Monday, Wednesday, and Friday.    . benazepril (LOTENSIN) 40 MG tablet Take 1 tablet (40 mg total) 2 (two) times daily by mouth. 60 tablet 3  . carvedilol (COREG) 3.125 MG tablet TAKE ONE TABLET BY MOUTH TWICE DAILY WITH A MEAL 60 tablet 6  . furosemide (LASIX) 20 MG tablet Take 20mg  (1 tablet) as needed for swelling. (Patient taking differently: Take 20 mg by mouth daily as needed for fluid or edema. ) 30 tablet 3  . Omega-3 Fatty Acids (FISH OIL) 1000 MG CAPS Take 1 capsule by mouth daily.    . Vitamin D, Cholecalciferol, 400 units CAPS Take 400 Units by mouth daily.     . hydrochlorothiazide (HYDRODIURIL) 25 MG tablet Take 1 tablet (25 mg total) by mouth daily. 30 tablet 0  . ciprofloxacin (CIPRO) 500 MG tablet 1 tab po bid x 10d (Patient not taking: Reported on 01/11/2017) 20 tablet 0   No facility-administered  medications prior to visit.     Allergies  Allergen Reactions  . Entresto [Sacubitril-Valsartan] Hives and Shortness Of Breath  . Amlodipine Swelling    LE swelling  . Contrast Media [Iodinated Diagnostic Agents] Other (See Comments)    Prostate problem had to wear a catheter for two weeks after having dye.   [name] Hydralazine Palpitations and Other (See Comments)    Fatigue+    ROS As per HPI  PE: Blood pressure (!) 165/91, pulse (!) 58, temperature 97.6 F (36.4 C), temperature source Oral, resp. rate 16, height 5\' 11"  (1.803 m), weight 166 lb (75.3 kg), SpO2 99 %. Gen: Alert, well appearing.  Patient is oriented to person, place, time, and situation. AFFECT: pleasant, lucid thought and speech. CV: RRR, no m/r/g.   LUNGS: CTA bilat, nonlabored resps, good  aeration in all lung fields. EXT: no clubbing, cyanosis, or edema.    LABS:    Chemistry      Component Value Date/Time   NA 142 12/14/2016 0942   K 5.1 12/14/2016 0942   CL 103 12/14/2016 0942   CO2 26 12/14/2016 0942   BUN 15 12/14/2016 0942   CREATININE 1.06 12/14/2016 0942      Component Value Date/Time   CALCIUM 9.5 12/14/2016 0942   ALKPHOS 86 09/16/2016 0858   AST 20 09/16/2016 0858   ALT 11 09/16/2016 0858   BILITOT 0.8 09/16/2016 0858      IMPRESSION AND PLAN:  HTN: good control now and feels much improved overall since recent addition of hctz 25mg  qd. Check BMET today. The current medical regimen is effective;  continue present plan and medications.  An After Visit Summary was printed and given to the patient.  FOLLOW UP: Return in about 6 months (around 07/12/2017) for annual CPE (fasting).  Signed:  Crissie Sickles, MD           01/11/2017

## 2017-01-18 ENCOUNTER — Other Ambulatory Visit: Payer: Self-pay | Admitting: Family Medicine

## 2017-01-19 ENCOUNTER — Ambulatory Visit: Payer: Medicare Other | Admitting: Physician Assistant

## 2017-03-06 ENCOUNTER — Telehealth: Payer: Self-pay | Admitting: Cardiology

## 2017-03-06 NOTE — Telephone Encounter (Signed)
Closed Encounter  °

## 2017-03-08 ENCOUNTER — Ambulatory Visit: Payer: Medicare Other | Admitting: Cardiology

## 2017-04-10 ENCOUNTER — Telehealth: Payer: Self-pay | Admitting: Cardiology

## 2017-04-10 NOTE — Telephone Encounter (Signed)
Closed Encounter  °

## 2017-04-17 ENCOUNTER — Other Ambulatory Visit: Payer: Self-pay | Admitting: Family Medicine

## 2017-04-17 NOTE — Telephone Encounter (Signed)
[place]  RF request for alprazolam LOV: 12/28/16 Next ov: 07/12/17 Last written: 07/28/16 #30 w/ 6RF  Please advise. Thanks.

## 2017-04-17 NOTE — Telephone Encounter (Signed)
Rx faxed

## 2017-04-19 ENCOUNTER — Ambulatory Visit: Payer: Medicare Other | Admitting: Cardiology

## 2017-05-24 ENCOUNTER — Encounter: Payer: Self-pay | Admitting: Cardiology

## 2017-06-06 NOTE — Progress Notes (Signed)
Cardiology Office Note   Date:  06/07/2017   ID:  Daniel Sandhoff., DOB October 24, 1945, MRN [id number]  PCP:  [name], MD  Cardiologist:   No primary care provider on file. Referring:  [name], MD  Chief Complaint  Patient presents with  . Coronary Artery Disease      History of Present Illness: Daniel [name]. is a 72 y.o. male who presents for follow up of CAD s/p CABG LIMA to D1, SVG to LAD, SVG to PDA on 05/12/2016, HLD intolerant to statin, HTN, ICM with baseline EF 30-35%, and h/o prostate CA.His last cardiac catheterization on 05/09/2016 showed EF 20-25%, akinesis of the distal inferoapical segment with significant hypokinesis globally consistent with ischemiccardiomyopathy, multivessel disease with 70% followed by total occlusion of large LAD with distal collateral arteries, 50% proximal left circumflex stenosis with total occlusion of distal left circumflex, total occlusion of mid RCA with distal collateral artery. Bypass surgery was recommended and he eventually underwent successful CABG 3 by Dr. Roxan Hockey on 05/12/2016 with LIMA to diagonal, SVG to LAD, SVG to PDA. He developed postoperative atrial fibrillation and was treated with IV amiodarone with conversion to sinus rhythm prior to discharge. He had a follow-up echocardiogram on 08/05/2016 which revealed LV EF remains low at 30-35% however slightly improved from his previous 20-25%.  He was placed on carvedilol and Entresto, spironolactone was discontinued due to elevated potassium level. Unfortunately, he went back to the hospital on 11/20/2016 with shortness breath, diffuse rash and presyncope. His blood pressure as in the 70s by home blood pressure cuff. He was given prednisone for his rash. He stopped the [facility] and restarted ACE inhibitor as he was sure the rash was related to the former.  This is his first appt with me.  He does well.  He is very active.  The patient denies any new symptoms such as chest  discomfort, neck or arm discomfort. There has been no new shortness of breath, PND or orthopnea. There have been no reported palpitations, presyncope or syncope.        Past Medical History:  Diagnosis Date  . Anxiety   . Atrial fibrillation ([place])    Limited episode, emergency room, June, 2013, spontaneous conversion to sinus rhythm, was evaluated By [name] 2013- no further visits.  Post-op (CABG) a-fib, converted on amio but pt self d/c'd this med due to DOE/side effect profile.  Daniel [name] BPH (benign prostatic hypertrophy) with urinary obstruction    Nocturia is his primary symptom.  Acute urinary retention occurred when he got CT with contrast fall 2016 ([name]).  Daniel [name] CAD (coronary artery disease) 05/2016   a. 05/2016: NSTEMI with cath showing 3-vessel disease. Underwent CABG on 05/12/16 with LIMA-D1, SVG-LAD, and SVG-PDA.   Daniel [name] COPD (chronic obstructive pulmonary disease) ([place]) fall 2016   Bullous changes noted on lower lung images of CT abd done by urology  . Diverticulitis   . GERD (gastroesophageal reflux disease)   . Has received pneumococcal vaccination   . Hyperlipidemia    statin intolerant  . Hypertension   . Ischemic cardiomyopathy 05/2016   EF 20-25% at the time of NSTEMI/CABG.  Repeat EF 07/2016 30-35%.  ACE-I and potassium stopped and entresto and aldactone started 09/11/16>>then hyperkalemia so aldactone d/c'd.  . Microscopic hematuria Fall 2016   CT nl except nonobstructing stones.  Cystoscopy normal 12/30/14 ([name])  . Nephrolithiasis    11 mm stone on R, 2 mm stone  on L, ureters clear  . Non-STEMI (non-ST elevated myocardial infarction) ([place]) 05/2016   with impaired LV function  . 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  Urol ([name]) at [facility]; [name] referred him to [name] 03/2015: patient got bilat nerve sparing , robot assisted laparoscopic radical prostatectomy and pelvic lymphadenectomy.  Urol f/u, PSA  undetectable on repeat 04/22/16 and 10/28/16; pt chose PSA surveill over adjuv rad tx.   . Tobacco dependence    down to 1-2 cigs per day as of 11/2015.  Restarted 08/2016.  Daniel [name] Urinary retention 05/2015   occurred s/p foley removal    Past Surgical History:  Procedure Laterality Date  . aortic ultrasound  07/2012   NO AAA  . COLONOSCOPY  approx 2011 per pt   Done by surgeon in [place] after a bout of diverticulitis; normal per pt report--was told to repeat in 10 yrs.  . CORONARY ARTERY BYPASS GRAFT N/A 05/12/2016   Procedure: CORONARY ARTERY BYPASS GRAFTING (CABG) TIMES THREE USING LEFT INTERNAL MAMMARY ARTERY AND RIGHT SAPHENOUS LEG VEIN HARVESTED ENDOSCOPICALLY;  Surgeon: [name], MD;  Location: [facility];  Service: Open Heart Surgery;  Laterality: N/A;  . INGUINAL HERNIA REPAIR  2003 and 2004   Both sides.  Daniel [name] LEFT HEART CATH AND CORONARY ANGIOGRAPHY N/A 05/09/2016   Procedure: Left Heart Cath and Coronary Angiography;  Surgeon: [name], MD;  Location: [facility] CV LAB;  Service: Cardiovascular;  Laterality: N/A;  . LITHOTRIPSY  2004   Dr. Maryland Pink in [place].  Daniel [name] LYMPHADENECTOMY Bilateral 04/30/2015   Procedure: PELVIC LYMPHADENECTOMY;  Surgeon: [name], MD;  [facility];  Service: Urology;  Laterality: Bilateral;  . PROSTATE BIOPSY  02/05/15   Prostate adenocarcinoma: pt considering treatment options as of 02/19/15  . ROBOT ASSISTED LAPAROSCOPIC RADICAL PROSTATECTOMY N/A 04/30/2015   Procedure: XI ROBOTIC ASSISTED LAPAROSCOPIC RADICAL PROSTATECTOMY LEVEL 2;  Surgeon: [name], MD;  [facility];  Service: Urology;  Laterality: N/A;  . TEE WITHOUT CARDIOVERSION N/A 05/12/2016   Procedure: TRANSESOPHAGEAL ECHOCARDIOGRAM (TEE);  Surgeon: [name], MD;  Location: [facility];  Service: Open Heart Surgery;  Laterality: N/A;  . TRANSTHORACIC ECHOCARDIOGRAM  03/22/2006; 05/2016; 07/2016   2008: EF 65%, normal valves, no wall motion abnormalties, LA size normal.  05/2016  in context of non-STEMI, EF 20-25%, mild AV and MV regurg.  08/05/16: EF 30-35%, akinesis of mid lateral myoc, grd II DD, mild aortic regurg.     Current Outpatient Medications  Medication Sig Dispense Refill  . ALPRAZolam (XANAX) 0.25 MG tablet TAKE ONE TABLET BY MOUTH AT BEDTIME AS NEEDED 30 tablet 5  . aspirin 81 MG tablet Take 1 tablet (81 mg total) by mouth daily.    . benazepril (LOTENSIN) 40 MG tablet Take 40 mg by mouth daily as needed.    . carvedilol (COREG) 6.25 MG tablet Take 1 tablet (6.25 mg total) by mouth 2 (two) times daily with a meal. 180 tablet 3  . furosemide (LASIX) 20 MG tablet Take 20mg  (1 tablet) as needed for swelling. (Patient taking differently: Take 20 mg by mouth daily as needed for fluid or edema. ) 30 tablet 3  . hydrochlorothiazide (HYDRODIURIL) 25 MG tablet Take 1 tablet (25 mg total) by mouth daily. 90 tablet 1  . Omega-3 Fatty Acids (FISH OIL) 1000 MG CAPS Take 1 capsule by mouth daily.    . Vitamin D, Cholecalciferol, 400  units CAPS Take 400 Units by mouth daily.      No current facility-administered medications for this visit.     Allergies:   Entresto [sacubitril-valsartan]; Amlodipine; Contrast media [iodinated diagnostic agents]; and Hydralazine    ROS:  Please see the history of present illness.   Otherwise, review of systems are positive for none.   All other systems are reviewed and negative.    PHYSICAL EXAM: VS:  BP (!) 150/98   Pulse 60   Ht 5\' 11"  (1.803 m)   Wt 167 lb (75.8 kg)   BMI 23.29 kg/m  , BMI Body mass index is 23.29 kg/m. GENERAL:  Well appearing HEENT:  Pupils equal round and reactive, fundi not visualized, oral mucosa unremarkable NECK:  No jugular venous distention, waveform within normal limits, carotid upstroke brisk and symmetric, no bruits, no thyromegaly LYMPHATICS:  No cervical, inguinal adenopathy LUNGS:  Clear to auscultation bilaterally BACK:  No CVA tenderness CHEST: Well healed sternotomy scar. HEART:  PMI  not displaced or sustained,S1 and S2 within normal limits, no S3, no S4, no clicks, no rubs, no murmurs ABD:  Flat, positive bowel sounds normal in frequency in pitch, no bruits, no rebound, no guarding, no midline pulsatile mass, no hepatomegaly, no splenomegaly EXT:  2 plus pulses throughout, no edema, no cyanosis no clubbing SKIN:  No rashes no nodules NEURO:  Cranial nerves II through XII grossly intact, motor grossly intact throughout PSYCH:  Cognitively intact, oriented to person place and time    EKG:  EKG is not ordered today.    Recent Labs: 09/16/2016: ALT 11; Hemoglobin 15.4; Platelets 186 01/11/2017: BUN 21; Creatinine, Ser 0.98; Potassium 4.5; Sodium 136    Lipid Panel    Component Value Date/Time   CHOL 137 09/16/2016 0858   TRIG 68 09/16/2016 0858   HDL 35 (L) 09/16/2016 0858   CHOLHDL 3.9 09/16/2016 0858   CHOLHDL 5 03/30/2016 0851   VLDL 16.6 03/30/2016 0851   LDLCALC 88 09/16/2016 0858      Wt Readings from Last 3 Encounters:  06/07/17 167 lb (75.8 kg)  01/11/17 166 lb (75.3 kg)  12/28/16 173 lb 8 oz (78.7 kg)      Other studies Reviewed: Additional studies/ records that were reviewed today include: Previous office and hospital records. Review of the above records demonstrates:  Please see elsewhere in the note.     ASSESSMENT AND PLAN:  CAD s/p CABG:   He can reduce ASA to 81 mg.   Ischemic cardiomyopathy: Baseline EF 30-35%. On carvedilol and benazepril. He was previously placed on Entresto, this was discontinued after he developed significant rash with hypotension.   I am going to increase Coreg to 6.24 mg daily.  I will repeat an echo in July.  Hypertension: This is being managed in the context of treating his CHF.    Hyperlipidemia:   Lipids as above.  Continue with current therapy.    PAF: Postoperative atrial fibrillaton occurred in the setting of bypass surgery.    Tobacco : He asked whether he could smoke to calm his nerves and I told  him no..      Current medicines are reviewed at length with the patient today.  The patient does not have concerns regarding medicines.  The following changes have been made:  As above  Labs/ tests ordered today include: None No orders of the defined types were placed in this encounter.    Disposition:   FU with me in six  months.      Signed, [name], MD  06/07/2017 11:00 AM    [place]

## 2017-06-07 ENCOUNTER — Ambulatory Visit: Payer: Medicare Other | Admitting: Cardiology

## 2017-06-07 ENCOUNTER — Encounter: Payer: Self-pay | Admitting: Cardiology

## 2017-06-07 VITALS — BP 150/98 | HR 60 | Ht 71.0 in | Wt 167.0 lb

## 2017-06-07 DIAGNOSIS — E785 Hyperlipidemia, unspecified: Secondary | ICD-10-CM

## 2017-06-07 DIAGNOSIS — I25810 Atherosclerosis of coronary artery bypass graft(s) without angina pectoris: Secondary | ICD-10-CM | POA: Diagnosis not present

## 2017-06-07 DIAGNOSIS — I10 Essential (primary) hypertension: Secondary | ICD-10-CM | POA: Diagnosis not present

## 2017-06-07 DIAGNOSIS — I480 Paroxysmal atrial fibrillation: Secondary | ICD-10-CM | POA: Diagnosis not present

## 2017-06-07 DIAGNOSIS — Z716 Tobacco abuse counseling: Secondary | ICD-10-CM | POA: Insufficient documentation

## 2017-06-07 DIAGNOSIS — I255 Ischemic cardiomyopathy: Secondary | ICD-10-CM | POA: Diagnosis not present

## 2017-06-07 MED ORDER — ASPIRIN EC 81 MG PO TBEC: 81.0000 mg | DELAYED_RELEASE_TABLET | Freq: Every day | ORAL | Status: DC

## 2017-06-07 MED ORDER — CARVEDILOL 6.25 MG PO TABS: 6.1250 mg | ORAL_TABLET | Freq: Two times a day (BID) | ORAL | 3 refills | Status: DC

## 2017-06-07 NOTE — Patient Instructions (Addendum)
Medication Instructions:  Please decrease your Asprin to 81 mg a day.   Increase Carvedilol to 6.25 mg twice a day.   Continue all other medications as listed.   You will be contacted to be schedule for an echocardiogram due in July.  Follow-Up: Follow up in 6 months with Dr. Percival Spanish.  You will receive a letter in the mail 2 months before you are due.  Please call us when you receive this letter to schedule your follow up appointment.  If you need a refill on your cardiac medications before your next appointment, please call your pharmacy.  Thank you for choosing [facility]!!

## 2017-06-19 ENCOUNTER — Telehealth: Payer: Self-pay | Admitting: *Deleted

## 2017-06-19 ENCOUNTER — Ambulatory Visit: Payer: Self-pay | Admitting: *Deleted

## 2017-06-19 NOTE — Telephone Encounter (Signed)
Copied from Daniel [name] [phone]. Topic: Appointment Scheduling - Scheduling Inquiry for Clinic >> Jun 19, 2017 11:10 AM Daniel [name] B wrote: Reason for CRM: pt called to get an appt b/c his bp and heart have been dropping and he is concerned about it, wants to speak about the medications for it as well, call pt to advise  >> Jun 19, 2017 11:44 AM Daniel [name], CMA wrote: Daniel [name] to call pt, pt was not home per wife. Pts wife could not remember pts cell phone number. Advised her to have pt call back. Pt needs to be triaged. Please have Daniel [name] Nurse triage pt. Thanks.

## 2017-06-19 NOTE — Telephone Encounter (Signed)
  Called in c/o fatigue along with a lower than normal BP and pulse for the last couple of weeks.   Only recent change was his Coreg was increased.  He denied that this has made him feel any different.   He is not sure why he is so tired.   He saw his cardiologist 2 weeks and was told everything was ok except his heart function is down a little to 35%.  I spoke with the flow coordinator since Dr. Anitra Lauth is booked this week.   Got an appt with Howard Pouch for 06/20/17 at 1:45PM.  I made it a 30 minute appt with the ok from the flow coordinator.    Pt verbalized understanding and was agreeable to above plan.    Reason for Disposition . [1] MODERATE weakness (i.e., interferes with work, school, normal activities) AND [2] persists > 3 days  Answer Assessment - Initial Assessment Questions 1. DESCRIPTION: "Describe how you are feeling."     For a couple of weeks I've been feeling so tired.   I just give out so easy.   My BP is 109/58 pulse  2. SEVERITY: "How bad is it?"  "Can you stand and walk?"   - MILD - Feels weak or tired, but does not interfere with work, school or normal activities   - [place] to stand and walk; weakness interferes with work, school, or normal activities   - SEVERE - Unable to stand or walk     I worked in the garden this morning for 30 minutes then I was give out. 3. ONSET:  "When did the weakness begin?"     A couple of weeks ago.   When I do physical work I give out easily.   No shortness of breath or chest pain. 4. CAUSE: "What do you think is causing the weakness?"     Not really.   Maybe my BP and pulse.   I saw my heart doctor 2 weeks ago and said everything looks good.   He said my heart function was 35%.   I needed to slow down.    5. MEDICINES: "Have you recently started a new medicine or had a change in the amount of a medicine?"     He doubled my dose of Coreg to 6.25.    6. OTHER SYMPTOMS: "Do you have any other symptoms?" (e.g., chest pain, fever,  cough, SOB, vomiting, diarrhea, bleeding)     No fever, cough, shortness of breath, no vomiting or diarrhea.    7. PREGNANCY: "Is there any chance you are pregnant?" "When was your last menstrual period?"     N/A  Protocols used: WEAKNESS (GENERALIZED) AND FATIGUE-A-AH

## 2017-06-19 NOTE — Telephone Encounter (Signed)
Pt called back and was Triaged (see triage note). Apt made for pt to see Dr. Raoul Pitch tomorrow (06/20/17) at 1:45pm (30 min apt).

## 2017-06-20 ENCOUNTER — Encounter: Payer: Self-pay | Admitting: Family Medicine

## 2017-06-20 ENCOUNTER — Ambulatory Visit: Payer: Medicare Other | Admitting: Family Medicine

## 2017-06-20 VITALS — BP 148/80 | HR 55 | Temp 97.9°F | Resp 20 | Ht 71.0 in | Wt 169.0 lb

## 2017-06-20 DIAGNOSIS — R42 Dizziness and giddiness: Secondary | ICD-10-CM | POA: Diagnosis not present

## 2017-06-20 DIAGNOSIS — R001 Bradycardia, unspecified: Secondary | ICD-10-CM | POA: Diagnosis not present

## 2017-06-20 DIAGNOSIS — I10 Essential (primary) hypertension: Secondary | ICD-10-CM | POA: Diagnosis not present

## 2017-06-20 HISTORY — DX: Bradycardia, unspecified: R00.1

## 2017-06-20 NOTE — Patient Instructions (Addendum)
Start coreg 3.125 every 12 hours (cut your current tab in half and take a half tab every 12 hours).  benazepril take 1/2 tab once daily HCTZ 25 mg a day.  Follow up Friday and we will adjust them again depending on your Blood pressure.   Try to drink some water a day to keep hydrated.    Bradycardia, Adult Bradycardia is a slower-than-normal heartbeat. A normal resting heart rate for an adult ranges from 60 to 100 beats per minute. With bradycardia, the resting heart rate is less than 60 beats per minute. Bradycardia can prevent enough oxygen from reaching certain areas of your body when you are active. It can be serious if it keeps enough oxygen from reaching your brain and other parts of your body. Bradycardia is not a problem for everyone. For some healthy adults, a slow resting heart rate is normal. What are the causes? This condition may be caused by:  A problem with the heart, including: ? A problem with the heart's electrical system, such as a heart block. ? A problem with the heart's natural pacemaker (sinus node). ? Heart disease. ? A heart attack. ? Heart damage. ? A heart infection. ? A heart condition that is present at birth (congenital heart defect).  Certain medicines that treat heart conditions.  Certain conditions, such as hypothyroidism and obstructive sleep apnea.  Problems with the balance of chemicals and other substances, like potassium, in the blood.  What increases the risk? This condition is more likely to develop in adults who:  Are age 74 or older.  Have high blood pressure (hypertension), high cholesterol (hyperlipidemia), or diabetes.  Drink heavily, use tobacco or nicotine products, or use drugs.  Are stressed.  What are the signs or symptoms? Symptoms of this condition include:  Light-headedness.  Feeling faint or fainting.  Fatigue and weakness.  Shortness of breath.  Chest pain  (angina).  Drowsiness.  Confusion.  Dizziness.  How is this diagnosed? This condition may be diagnosed based on:  Your symptoms.  Your medical history.  A physical exam.  During the exam, your health care provider will listen to your heartbeat and check your pulse. To confirm the diagnosis, your health care provider may order tests, such as:  Blood tests.  An electrocardiogram (ECG). This test records the heart's electrical activity. The test can show how fast your heart is beating and whether the heartbeat is steady.  A test in which you wear a portable device (event recorder or Holter monitor) to record your heart's electrical activity while you go about your day.  Anexercise test.  How is this treated? Treatment for this condition depends on the cause of the condition and how severe your symptoms are. Treatment may involve:  Treatment of the underlying condition.  Changing your medicines or how much medicine you take.  Having a small, battery-operated device called a pacemaker implanted under the skin. When bradycardia occurs, this device can be used to increase your heart rate and help your heart to beat in a regular rhythm.  Follow these instructions at home: Lifestyle   Manage any health conditions that contribute to bradycardia as told by your health care provider.  Follow a heart-healthy diet. A nutrition specialist (dietitian) can help to educate you about healthy food options and changes.  Follow an exercise program that is approved by your health care provider.  Maintain a healthy weight.  Try to reduce or manage your stress, such as with yoga or meditation. If  you need help reducing stress, ask your health care provider.  Do not use use any products that contain nicotine or tobacco, such as cigarettes and e-cigarettes. If you need help quitting, ask your health care provider.  Do not use illegal drugs.  Limit alcohol intake to no more than 1 drink  per day for nonpregnant women and 2 drinks per day for men. One drink equals 12 oz of beer, 5 oz of wine, or 1 oz of hard liquor. General instructions  Take over-the-counter and prescription medicines only as told by your health care provider.  Keep all follow-up visits as directed by your health care provider. This is important. How is this prevented? In some cases, bradycardia may be prevented by:  Treating underlying medical problems.  Stopping behaviors or medicines that can trigger the condition.  Contact a health care provider if:  You feel light-headed or dizzy.  You almost faint.  You feel weak or are easily fatigued during physical activity.  You experience confusion or have memory problems. Get help right away if:  You faint.  You have an irregular heartbeat (palpitations).  You have chest pain.  You have trouble breathing. This information is not intended to replace advice given to you by your health care provider. Make sure you discuss any questions you have with your health care provider. Document Released: 10/09/2001 Document Revised: 09/15/2015 Document Reviewed: 07/09/2015 Elsevier Interactive Patient Education  2017 [employer].

## 2017-06-20 NOTE — Progress Notes (Signed)
Daniel [name] , 05-24-1945, 72 y.o., male MRN: [id number] Patient Care Team    Relationship Specialty Notifications Start End  McGowen, Adrian Blackwater, MD PCP - General Family Medicine  08/16/12    Comment: Daniel [name], Leonie [name], MD Consulting Physician Cardiology  08/16/12   Sadie Haber, MD Consulting Physician Urology  11/26/14   [name], MD Consulting Physician Urology  03/29/15   [name], MD Consulting Physician Cardiothoracic Surgery  06/21/16   [name], MD Consulting Physician Cardiology  06/21/16   Almyra Deforest, [place] Consulting Physician Cardiology  12/31/16     Chief Complaint  Patient presents with  . Fatigue    generalized weakness x 3 weeks     Subjective: Pt presents for an OV with complaints of fatigue of greater than 3 weeks duration.  Associated symptoms include weakness, dizziness, and bradycardia.  Patient reports he recently was evaluated by his cardiologist who increased his Coreg from 3.125 twice daily to 6.25 twice daily.  Since that time he reports the symptoms above have worsened.  He has recorded his heart rate routinely in the 40s and 50s, during those symptoms.  He does admit he does not drink much water.  Reports multiple occurrences of the weakness and dizziness, however this occurred while he was outside working in the yard.  He reports he has taken no blood pressure medications today.  He also reports that he does not routinely take his Lotensin, which is prescribed 40 mg daily.  He reports that his blood pressure gets in the 170s he will take a tab.  He does take HCTZ 25 mg daily.  He reports when he did take his blood pressure medicine as prescribed, his blood pressure was 110-118/62-65, with a heart rate of 45-51 bpm.  today with no blood pressure medicine ( at home) he had a blood pressure of 147/93 with a heart rate of 60 and felt more energetic. Reviewed echocardiogram completed last year, with repeat already scheduled for July 8 2019th.     Depression screen [facility] 2/9 09/27/2016 11/27/2015 08/28/2014  Decreased Interest 0 0 0  Down, Depressed, Hopeless 0 0 1  PHQ - 2 Score 0 0 1    Allergies  Allergen Reactions  . Entresto [Sacubitril-Valsartan] Hives and Shortness Of Breath  . Amlodipine Swelling    LE swelling  . Contrast Media [Iodinated Diagnostic Agents] Other (See Comments)    Prostate problem had to wear a catheter for two weeks after having dye.   [name] Hydralazine Palpitations and Other (See Comments)    Fatigue+   Social History   Tobacco Use  . Smoking status: Former Smoker    Packs/day: 1.00    Years: 55.00    Pack years: 55.00    Types: Cigarettes  . Smokeless tobacco: Never Used  . Tobacco comment: STOPPED AT SURGERY  Substance Use Topics  . Alcohol use: No   Past Medical History:  Diagnosis Date  . Anxiety   . Atrial fibrillation ([place])    Limited episode, emergency room, June, 2013, spontaneous conversion to sinus rhythm, was evaluated By [name] 2013- no further visits.  Post-op (CABG) a-fib, converted on amio but pt self d/c'd this med due to DOE/side effect profile.  [name] BPH (benign prostatic hypertrophy) with urinary obstruction    Nocturia is his primary symptom.  Acute urinary retention occurred when he got CT with contrast fall 2016 ([name]).  [name] CAD (coronary artery disease)  05/2016   a. 05/2016: NSTEMI with cath showing 3-vessel disease. Underwent CABG on 05/12/16 with LIMA-D1, SVG-LAD, and SVG-PDA.   [name] COPD (chronic obstructive pulmonary disease) ([place]) fall 2016   Bullous changes noted on lower lung images of CT abd done by urology  . Diverticulitis   . GERD (gastroesophageal reflux disease)   . Has received pneumococcal vaccination   . Hyperlipidemia    statin intolerant  . Hypertension   . Ischemic cardiomyopathy 05/2016   EF 20-25% at the time of NSTEMI/CABG.  Repeat EF 07/2016 30-35%.  ACE-I and potassium stopped and entresto and aldactone started 09/11/16>>then hyperkalemia so  aldactone d/c'd.  . Microscopic hematuria Fall 2016   CT nl except nonobstructing stones.  Cystoscopy normal 12/30/14 ([name])  . Nephrolithiasis    11 mm stone on R, 2 mm stone on L, ureters clear  . Non-STEMI (non-ST elevated myocardial infarction) ([place]) 05/2016   with impaired LV function  . 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  Urol ([name]) at [facility]; [name] referred him to [name] 03/2015: patient got bilat nerve sparing , robot assisted laparoscopic radical prostatectomy and pelvic lymphadenectomy.  Urol f/u, PSA undetectable on repeat 04/22/16 and 10/28/16; pt chose PSA surveill over adjuv rad tx.   . Tobacco dependence    down to 1-2 cigs per day as of 11/2015.  Restarted 08/2016.  [name] Urinary retention 05/2015   occurred s/p foley removal   Past Surgical History:  Procedure Laterality Date  . aortic ultrasound  07/2012   NO AAA  . COLONOSCOPY  approx 2011 per pt   Done by surgeon in [place] after a bout of diverticulitis; normal per pt report--was told to repeat in 10 yrs.  . CORONARY ARTERY BYPASS GRAFT N/A 05/12/2016   Procedure: CORONARY ARTERY BYPASS GRAFTING (CABG) TIMES THREE USING LEFT INTERNAL MAMMARY ARTERY AND RIGHT SAPHENOUS LEG VEIN HARVESTED ENDOSCOPICALLY;  Surgeon: [name], MD;  Location: [facility];  Service: Open Heart Surgery;  Laterality: N/A;  . INGUINAL HERNIA REPAIR  2003 and 2004   Both sides.  [name] LEFT HEART CATH AND CORONARY ANGIOGRAPHY N/A 05/09/2016   Procedure: Left Heart Cath and Coronary Angiography;  Surgeon: [name], MD;  Location: [facility];  Service: Cardiovascular;  Laterality: N/A;  . LITHOTRIPSY  2004   Dr. [place] Pink in [place].  [name] LYMPHADENECTOMY Bilateral 04/30/2015   Procedure: PELVIC LYMPHADENECTOMY;  Surgeon: [name], MD;  Location: [facility];  Service: Urology;  Laterality: Bilateral;  . PROSTATE BIOPSY  02/05/15   Prostate adenocarcinoma: pt considering  treatment options as of 02/19/15  . ROBOT ASSISTED LAPAROSCOPIC RADICAL PROSTATECTOMY N/A 04/30/2015   Procedure: XI ROBOTIC ASSISTED LAPAROSCOPIC RADICAL PROSTATECTOMY LEVEL 2;  Surgeon: [name], MD;  Location: [facility];  Service: Urology;  Laterality: N/A;  . TEE WITHOUT CARDIOVERSION N/A 05/12/2016   Procedure: TRANSESOPHAGEAL ECHOCARDIOGRAM (TEE);  Surgeon: [name], MD;  Location: [facility];  Service: Open Heart Surgery;  Laterality: N/A;  . TRANSTHORACIC ECHOCARDIOGRAM  03/22/2006; 05/2016; 07/2016   2008: EF 65%, normal valves, no wall motion abnormalties, LA size normal.  05/2016 in context of non-STEMI, EF 20-25%, mild AV and MV regurg.  08/05/16: EF 30-35%, akinesis of mid lateral myoc, grd II DD, mild aortic regurg.   Family History  Problem Relation Age of Onset  . Hypertension Mother   . Cancer - Other Mother 17  breast  . Cancer Father        Lung ca age 33  . Diabetes Sister    Allergies as of 06/20/2017      Reactions   Entresto [sacubitril-valsartan] Hives, Shortness Of Breath   Amlodipine Swelling   LE swelling   Contrast Media [iodinated Diagnostic Agents] Other (See Comments)   Prostate problem had to wear a catheter for two weeks after having dye.    Hydralazine Palpitations, Other (See Comments)   Fatigue+      Medication List        Accurate as of 06/20/17  2:04 PM. Always use your most recent med list.          ALPRAZolam 0.25 MG tablet Commonly known as:  XANAX TAKE ONE TABLET BY MOUTH AT BEDTIME AS NEEDED   aspirin EC 81 MG tablet Take 1 tablet (81 mg total) by mouth daily.   benazepril 40 MG tablet Commonly known as:  LOTENSIN Take 40 mg by mouth daily as needed.   carvedilol 6.25 MG tablet Commonly known as:  COREG Take 1 tablet (6.25 mg total) by mouth 2 (two) times daily with a meal.   Fish Oil 1000 MG Caps Take 1 capsule by mouth daily.   furosemide 20 MG tablet Commonly known as:  LASIX Take 20mg  (1 tablet) as needed for  swelling.   hydrochlorothiazide 25 MG tablet Commonly known as:  HYDRODIURIL Take 1 tablet (25 mg total) by mouth daily.   Vitamin D (Cholecalciferol) 400 units Caps Take 400 Units by mouth daily.       All past medical history, surgical history, allergies, family history, immunizations andmedications were updated in the EMR today and reviewed under the history and medication portions of their EMR.     ROS: Negative, with the exception of above mentioned in HPI   Objective:  BP (!) 148/80 (BP Location: Right Arm, Patient Position: Sitting, Cuff Size: Normal)   Pulse (!) 55   Temp 97.9 F (36.6 C)   Resp 20   Ht 5\' 11"  (1.803 m)   Wt 169 lb (76.7 kg)   SpO2 98%   BMI 23.57 kg/m  Body mass index is 23.57 kg/m. Gen: Afebrile. No acute distress. Nontoxic in appearance, well developed, well nourished.  Very pleasant Caucasian male. HENT: AT. [place].  MMM Eyes:Pupils Equal Round Reactive to light, Extraocular movements intact,  Conjunctiva without redness, discharge or icterus. CV: Bradycardia on exam today, no edema Chest: CTAB, no wheeze or crackles. Good air movement, normal resp effort.  Neuro:  Normal gait. PERLA. EOMi. Alert. Oriented x3  Psych: Normal affect, dress and demeanor. Normal speech. Normal thought content and judgment.  No exam data present No results found. No results found for this or any previous visit (from the past 24 hour(s)).  Assessment/Plan: Dewell Monnier. is a 72 y.o. male present for OV for  Essential hypertension/Bradycardia/dizziness -Was rather difficult to figure out patient's blood pressure regimen.  He does not take the Benzapril as prescribed routinely.  I do believe his symptoms are secondary to the recent increase in his Coreg dose causing bradycardia. -He does not drink any water in a day, encouraged him to start drinking at least a couple glasses of water a day, caution on any lower extremity edema development. -Decreased his Coreg back  to 3.125 mg twice daily, he was instructed to take a half a tablet of his current dose (6.25 tabs) twice daily. -Decreased his benazepril to half  a tab (20 mg daily).  It was very difficult to understand exactly how much Benzapril he was taking, when reporting blood pressures in the 110-120s. May need even lower dose on recheck  - Continue HCTZ - BMP collected today.  - f/u with PCP made for Friday to recheck BP and adjust meds (I did not change in the med list-until doses stable). He is to record BP/HR daily before and at least 2 hours after meds.    Reviewed expectations re: course of current medical issues.  Discussed self-management of symptoms.  Outlined signs and symptoms indicating need for more acute intervention.  Patient verbalized understanding and all questions were answered.  Patient received an After-Visit Summary.  > 25 minutes spent with patient, >50% of time spent face to face counseling and coordinating care.      No orders of the defined types were placed in this encounter.    Note is dictated utilizing voice recognition software. Although note has been proof read prior to signing, occasional typographical errors still can be missed. If any questions arise, please do not hesitate to call for verification.   electronically signed by:  Howard Pouch, DO  [place]

## 2017-06-21 LAB — BASIC METABOLIC PANEL
BUN: 19 mg/dL (ref 7–25)
CALCIUM: 9.3 mg/dL (ref 8.6–10.3)
CHLORIDE: 105 mmol/L (ref 98–110)
CO2: 27 mmol/L (ref 20–32)
Creat: 0.95 mg/dL (ref 0.70–1.18)
Glucose, Bld: 88 mg/dL (ref 65–99)
POTASSIUM: 3.7 mmol/L (ref 3.5–5.3)
SODIUM: 140 mmol/L (ref 135–146)

## 2017-06-23 ENCOUNTER — Encounter: Payer: Self-pay | Admitting: Family Medicine

## 2017-06-23 ENCOUNTER — Ambulatory Visit: Payer: Medicare Other | Admitting: Family Medicine

## 2017-06-23 VITALS — BP 132/82 | HR 51 | Temp 98.1°F | Resp 16 | Ht 71.0 in | Wt 173.0 lb

## 2017-06-23 DIAGNOSIS — R001 Bradycardia, unspecified: Secondary | ICD-10-CM

## 2017-06-23 DIAGNOSIS — R5383 Other fatigue: Secondary | ICD-10-CM | POA: Diagnosis not present

## 2017-06-23 DIAGNOSIS — I10 Essential (primary) hypertension: Secondary | ICD-10-CM

## 2017-06-23 NOTE — Progress Notes (Signed)
OFFICE VISIT  06/23/2017   CC:  Chief Complaint  Patient presents with  . Follow-up    HTN, Bradycardia, and Weakness    HPI:    Patient is a 72 y.o. Caucasian male who presents for 3 day f/u HTN, bradycardia, and fatigue. He has CAD with hx of CABG about 1 yr ago, has ischemic CM with EF 30-35%, and Dr. Percival Spanish has ordered his next echocardiogram for 07/2017. Dr. Raoul Pitch evaluated him 3 d/a and he had bradycardia---his coreg had been increased at recent cardiology f/u and pt did think the fatigue, bradycardia, and dizziness began after this. She decreased his coreg by 50% and recommended he f/u with me today.  It was unclear whether pt was taking his benazepril daily or just prn, so he was instructed to take this med at 20mg  qd dosing.  His hctz was not changed.  BMET at last visit was normal.  He states his weakness has improved significantly. Home bp's: mornings 160s/80s, HR 47.   LUNCH 129/74, HR 49.  HS 124/67, HR 52--this was the day he had decreased his coreg dose. Next day, bp's remained normal, HR 49-50s. This morning 145/72, HR 52.  Lunch: bp normal, HR 54.  Denies any presyncope/dizziness, CP, palpitations, SOB, PND, orthopnea, or LE edema.   Past Medical History:  Diagnosis Date  . Anxiety   . Atrial fibrillation ([place])    Limited episode, emergency room, June, 2013, spontaneous conversion to sinus rhythm, was evaluated By [name] 2013- no further visits.  Post-op (CABG) a-fib, converted on amio but pt self d/c'd this med due to DOE/side effect profile.  [name] BPH (benign prostatic hypertrophy) with urinary obstruction    Nocturia is his primary symptom.  Acute urinary retention occurred when he got CT with contrast fall 2016 ([name]).  [name] CAD (coronary artery disease) 05/2016   a. 05/2016: NSTEMI with cath showing 3-vessel disease. Underwent CABG on 05/12/16 with LIMA-D1, SVG-LAD, and SVG-PDA.   [name] COPD (chronic obstructive pulmonary disease) ([place]) fall 2016   Bullous  changes noted on lower lung images of CT abd done by urology  . Diverticulitis   . GERD (gastroesophageal reflux disease)   . Has received pneumococcal vaccination   . Hyperlipidemia    statin intolerant  . Hypertension   . Ischemic cardiomyopathy 05/2016   EF 20-25% at the time of NSTEMI/CABG.  Repeat EF 07/2016 30-35%.  ACE-I and potassium stopped and entresto and aldactone started 09/11/16>>then hyperkalemia so aldactone d/c'd.  . Microscopic hematuria Fall 2016   CT nl except nonobstructing stones.  Cystoscopy normal 12/30/14 ([name])  . Nephrolithiasis    11 mm stone on R, 2 mm stone on L, ureters clear  . Non-STEMI (non-ST elevated myocardial infarction) ([place]) 05/2016   with impaired LV function  . 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  Urol ([name]) at [facility]; [name] referred him to [name] 03/2015: patient got bilat nerve sparing , robot assisted laparoscopic radical prostatectomy and pelvic lymphadenectomy.  Urol f/u, PSA undetectable on repeat 04/22/16 and 10/28/16; pt chose PSA surveill over adjuv rad tx.   . Tobacco dependence    down to 1-2 cigs per day as of 11/2015.  Restarted 08/2016.  [name] Urinary retention 05/2015   occurred s/p foley removal    Past Surgical History:  Procedure Laterality Date  . aortic ultrasound  07/2012   NO AAA  . COLONOSCOPY  approx  2011 per pt   Done by surgeon in [place] after a bout of diverticulitis; normal per pt report--was told to repeat in 10 yrs.  . CORONARY ARTERY BYPASS GRAFT N/A 05/12/2016   Procedure: CORONARY ARTERY BYPASS GRAFTING (CABG) TIMES THREE USING LEFT INTERNAL MAMMARY ARTERY AND RIGHT SAPHENOUS LEG VEIN HARVESTED ENDOSCOPICALLY;  Surgeon: [name], MD;  Location: [place];  Service: Open Heart Surgery;  Laterality: N/A;  . INGUINAL HERNIA REPAIR  2003 and 2004   Both sides.  [name] LEFT HEART CATH AND CORONARY ANGIOGRAPHY N/A 05/09/2016   Procedure: Left Heart  Cath and Coronary Angiography;  Surgeon: [name], MD;  Location: [place] [facility];  Service: Cardiovascular;  Laterality: N/A;  . LITHOTRIPSY  2004   Dr. Maryland Pink in [place].  [name] LYMPHADENECTOMY Bilateral 04/30/2015   Procedure: PELVIC LYMPHADENECTOMY;  Surgeon: [name], MD;  [facility]: [facility];  Service: Urology;  Laterality: Bilateral;  . PROSTATE BIOPSY  02/05/15   Prostate adenocarcinoma: pt considering treatment options as of 02/19/15  . ROBOT ASSISTED LAPAROSCOPIC RADICAL PROSTATECTOMY N/A 04/30/2015   Procedure: XI ROBOTIC ASSISTED LAPAROSCOPIC RADICAL PROSTATECTOMY LEVEL 2;  Surgeon: [name], MD;  [facility];  Service: Urology;  Laterality: N/A;  . TEE WITHOUT CARDIOVERSION N/A 05/12/2016   Procedure: TRANSESOPHAGEAL ECHOCARDIOGRAM (TEE);  Surgeon: [name], MD;  Location: [place];  Service: Open Heart Surgery;  Laterality: N/A;  . TRANSTHORACIC ECHOCARDIOGRAM  03/22/2006; 05/2016; 07/2016   2008: EF 65%, normal valves, no wall motion abnormalties, LA size normal.  05/2016 in context of non-STEMI, EF 20-25%, mild AV and MV regurg.  08/05/16: EF 30-35%, akinesis of mid lateral myoc, grd II DD, mild aortic regurg.    Outpatient Medications Prior to Visit  Medication Sig Dispense Refill  . ALPRAZolam (XANAX) 0.25 MG tablet TAKE ONE TABLET BY MOUTH AT BEDTIME AS NEEDED 30 tablet 5  . aspirin 81 MG tablet Take 1 tablet (81 mg total) by mouth daily.    . benazepril (LOTENSIN) 40 MG tablet Take 40 mg by mouth daily as needed.    . carvedilol (COREG) 6.25 MG tablet Take 1 tablet (6.25 mg total) by mouth 2 (two) times daily with a meal. 180 tablet 3  . furosemide (LASIX) 20 MG tablet Take 20mg  (1 tablet) as needed for swelling. (Patient taking differently: Take 20 mg by mouth daily as needed for fluid or edema. ) 30 tablet 3  . hydrochlorothiazide (HYDRODIURIL) 25 MG tablet Take 1 tablet (25 mg total) by mouth daily. 90 tablet 1  . Omega-3 Fatty Acids (FISH OIL) 1000 MG CAPS  Take 1 capsule by mouth daily.    . Vitamin D, Cholecalciferol, 400 units CAPS Take 400 Units by mouth daily.      No facility-administered medications prior to visit.     Allergies  Allergen Reactions  . Entresto [Sacubitril-Valsartan] Hives and Shortness Of Breath  . Amlodipine Swelling    LE swelling  . Contrast Media [Iodinated Diagnostic Agents] Other (See Comments)    Prostate problem had to wear a catheter for two weeks after having dye.   [name] Hydralazine Palpitations and Other (See Comments)    Fatigue+    ROS As per HPI  PE: Blood pressure 132/82, pulse (!) 51, temperature 98.1 F (36.7 C), temperature source Oral, resp. rate 16, height 5\' 11"  (1.803 m), weight 173 lb (78.5 kg), SpO2 97 %. Gen: Alert, well appearing.  Patient is oriented to person, place, time, and situation. AFFECT:  pleasant, lucid thought and speech. GEX:BMWU: no injection, icteris, swelling, or exudate.  EOMI, PERRLA. Mouth: lips without lesion/swelling.  Oral mucosa pink and moist. Oropharynx without erythema, exudate, or swelling.  CV: Regular, brady (approx 50), no m/r. Chest is clear, no wheezing or rales. Normal symmetric air entry throughout both lung fields. No chest wall deformities or tenderness. EXT: no clubbing or cyanosis.  He has 2+ pitting edema in RLL, 1+ pitting in LLL.  LABS:    Chemistry      Component Value Date/Time   NA 140 06/20/2017 1436   NA 142 12/14/2016 0942   K 3.7 06/20/2017 1436   CL 105 06/20/2017 1436   CO2 27 06/20/2017 1436   BUN 19 06/20/2017 1436   BUN 15 12/14/2016 0942   CREATININE 0.95 06/20/2017 1436      Component Value Date/Time   CALCIUM 9.3 06/20/2017 1436   ALKPHOS 86 09/16/2016 0858   AST 20 09/16/2016 0858   ALT 11 09/16/2016 0858   BILITOT 0.8 09/16/2016 0858       IMPRESSION AND PLAN:  1) Fatigue: seems to be related to bradycardia from his coreg increase recently. Better now on 1/2 of 6.25mg  coreg tab bid.  2) HTN: bp's good on  benazepril 20mg  qd, hctz 25mg  qd, and coreg as in #1 above.  An After Visit Summary was printed and given to the patient.  FOLLOW UP: Return for keep appt coming up soon.  Signed:  Crissie Sickles, MD           06/23/2017

## 2017-07-12 ENCOUNTER — Ambulatory Visit (INDEPENDENT_AMBULATORY_CARE_PROVIDER_SITE_OTHER): Payer: Medicare Other | Admitting: Family Medicine

## 2017-07-12 ENCOUNTER — Encounter: Payer: Self-pay | Admitting: Family Medicine

## 2017-07-12 VITALS — BP 163/88 | HR 48 | Temp 98.3°F | Resp 16 | Ht 69.5 in | Wt 170.2 lb

## 2017-07-12 DIAGNOSIS — I10 Essential (primary) hypertension: Secondary | ICD-10-CM | POA: Diagnosis not present

## 2017-07-12 DIAGNOSIS — Z0000 Encounter for general adult medical examination without abnormal findings: Secondary | ICD-10-CM | POA: Diagnosis not present

## 2017-07-12 DIAGNOSIS — I255 Ischemic cardiomyopathy: Secondary | ICD-10-CM

## 2017-07-12 DIAGNOSIS — E7800 Pure hypercholesterolemia, unspecified: Secondary | ICD-10-CM

## 2017-07-12 LAB — COMPREHENSIVE METABOLIC PANEL
ALT: 12 U/L (ref 0–53)
AST: 19 U/L (ref 0–37)
Albumin: 4.4 g/dL (ref 3.5–5.2)
Alkaline Phosphatase: 65 U/L (ref 39–117)
BUN: 11 mg/dL (ref 6–23)
CALCIUM: 9.9 mg/dL (ref 8.4–10.5)
CHLORIDE: 100 meq/L (ref 96–112)
CO2: 31 mEq/L (ref 19–32)
Creatinine, Ser: 0.95 mg/dL (ref 0.40–1.50)
GFR: 82.78 mL/min (ref >60.00)
Glucose, Bld: 94 mg/dL (ref 70–99)
POTASSIUM: 4.3 meq/L (ref 3.5–5.1)
Sodium: 137 mEq/L (ref 135–145)
Total Bilirubin: 1 mg/dL (ref 0.2–1.2)
Total Protein: 6.7 g/dL (ref 6.0–8.3)

## 2017-07-12 LAB — CBC WITH DIFFERENTIAL/PLATELET
BASOS PCT: 0.6 % (ref 0.0–3.0)
Basophils Absolute: 0 10*3/uL (ref 0.0–0.1)
EOS ABS: 0.1 10*3/uL (ref 0.0–0.7)
EOS PCT: 2 % (ref 0.0–5.0)
HEMATOCRIT: 48.6 % (ref 39.0–52.0)
HEMOGLOBIN: 16.7 g/dL (ref 13.0–17.0)
LYMPHS PCT: 19.9 % (ref 12.0–46.0)
Lymphs Abs: 1.4 10*3/uL (ref 0.7–4.0)
MCHC: 34.3 g/dL (ref 30.0–36.0)
MCV: 93 fl (ref 78.0–100.0)
Monocytes Absolute: 0.5 10*3/uL (ref 0.1–1.0)
Monocytes Relative: 7.2 % (ref 3.0–12.0)
NEUTROS ABS: 4.8 10*3/uL (ref 1.4–7.7)
Neutrophils Relative %: 70.3 % (ref 43.0–77.0)
PLATELETS: 167 10*3/uL (ref 150.0–400.0)
RBC: 5.23 Mil/uL (ref 4.22–5.81)
RDW: 14.6 % (ref 11.5–15.5)
WBC: 6.9 10*3/uL (ref 4.0–10.5)

## 2017-07-12 LAB — LIPID PANEL
Cholesterol: 167 mg/dL (ref 0–200)
HDL: 34.9 mg/dL — AB (ref >39.00)
LDL CALC: 117 mg/dL — AB (ref 0–99)
NonHDL: 131.75
TRIGLYCERIDES: 74 mg/dL (ref 0.0–149.0)
Total CHOL/HDL Ratio: 5
VLDL: 14.8 mg/dL (ref 0.0–40.0)

## 2017-07-12 MED ORDER — BENAZEPRIL HCL 40 MG PO TABS: 40.0000 mg | ORAL_TABLET | Freq: Every day | ORAL | 6 refills | Status: DC | PRN

## 2017-07-12 NOTE — Patient Instructions (Signed)

## 2017-07-12 NOTE — Progress Notes (Signed)
Office Note 07/12/2017  CC:  Chief Complaint  Patient presents with  . Annual Exam    Pt is fasting.     HPI:  Daniel [name]. is a 72 y.o. White male who is here for annual health maintenance exam. Feeling well.  Feels a little tired after a hard day's work but while he is working he feels fine.  Next echocardiogram will be next month to relook at MVR and EF. HOme BPs usually 160s-170/80, HR usually 48-54.  Says he feels fatigue/washed out when bp 130 over 80 or lower.  Exercise: works on cars, gardens, walking around yard working. Diet: limiting sodium.  " I eat normal country food" Dentist: has dentures. EYES; exam UTD.  He has taken 1/2 of benazepril today, otherwise no meds taken today. He took 1/2 lasix yesterday b/c of some mild LE edema yesterday morning.  This is the first one he has taken in a few weeks.  Past Medical History:  Diagnosis Date  . Anxiety   . Atrial fibrillation ([place])    Limited episode, emergency room, June, 2013, spontaneous conversion to sinus rhythm, was evaluated By [name] 2013- no further visits.  Post-op (CABG) a-fib, converted on amio but pt self d/c'd this med due to DOE/side effect profile.  [name] BPH (benign prostatic hypertrophy) with urinary obstruction    Nocturia is his primary symptom.  Acute urinary retention occurred when he got CT with contrast fall 2016 ([name]).  [name] CAD (coronary artery disease) 05/2016   a. 05/2016: NSTEMI with cath showing 3-vessel disease. Underwent CABG on 05/12/16 with LIMA-D1, SVG-LAD, and SVG-PDA.   [name] COPD (chronic obstructive pulmonary disease) ([place]) fall 2016   Bullous changes noted on lower lung images of CT abd done by urology  . Diverticulitis   . GERD (gastroesophageal reflux disease)   . Has received pneumococcal vaccination   . Hyperlipidemia    statin intolerant  . Hypertension   . Ischemic cardiomyopathy 05/2016   EF 20-25% at the time of NSTEMI/CABG.  Repeat EF 07/2016 30-35%.  ACE-I and  potassium stopped and entresto and aldactone started 09/11/16>>then hyperkalemia so aldactone d/c'd.  . Microscopic hematuria Fall 2016   CT nl except nonobstructing stones.  Cystoscopy normal 12/30/14 ([name])  . Nephrolithiasis    11 mm stone on R, 2 mm stone on L, ureters clear  . Non-STEMI (non-ST elevated myocardial infarction) ([place]) 05/2016   with impaired LV function  . 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  Urol ([name]) at [facility]; [name] referred him to [name] 03/2015: patient got bilat nerve sparing , robot assisted laparoscopic radical prostatectomy and pelvic lymphadenectomy.  Urol f/u, PSA undetectable on repeat 04/22/16 and 10/28/16; pt chose PSA surveill over adjuv rad tx.   . Tobacco dependence    down to 1-2 cigs per day as of 11/2015.  Restarted 08/2016.  [name] Urinary retention 05/2015   occurred s/p foley removal    Past Surgical History:  Procedure Laterality Date  . aortic ultrasound  07/2012   NO AAA  . COLONOSCOPY  approx 2011 per pt   Done by surgeon in [place] after a bout of diverticulitis; normal per pt report--was told to repeat in 10 yrs.  . CORONARY ARTERY BYPASS GRAFT N/A 05/12/2016   Procedure: CORONARY ARTERY BYPASS GRAFTING (CABG) TIMES THREE USING LEFT INTERNAL MAMMARY ARTERY AND RIGHT SAPHENOUS LEG VEIN HARVESTED ENDOSCOPICALLY;  Surgeon: Remo Lipps  Chaya Jan, MD;  Location: [place];  Service: Open Heart Surgery;  Laterality: N/A;  . INGUINAL HERNIA REPAIR  2003 and 2004   Both sides.  [name] LEFT HEART CATH AND CORONARY ANGIOGRAPHY N/A 05/09/2016   Procedure: Left Heart Cath and Coronary Angiography;  Surgeon: [name], MD;  Location: [place] [facility];  Service: Cardiovascular;  Laterality: N/A;  . LITHOTRIPSY  2004   Dr. Maryland Pink in [place].  [name] LYMPHADENECTOMY Bilateral 04/30/2015   Procedure: PELVIC LYMPHADENECTOMY;  Surgeon: [name], MD;  [facility];  Service: Urology;  Laterality:  Bilateral;  . PROSTATE BIOPSY  02/05/15   Prostate adenocarcinoma: pt considering treatment options as of 02/19/15  . ROBOT ASSISTED LAPAROSCOPIC RADICAL PROSTATECTOMY N/A 04/30/2015   Procedure: XI ROBOTIC ASSISTED LAPAROSCOPIC RADICAL PROSTATECTOMY LEVEL 2;  Surgeon: [name], MD;  [facility];  Service: Urology;  Laterality: N/A;  . TEE WITHOUT CARDIOVERSION N/A 05/12/2016   Procedure: TRANSESOPHAGEAL ECHOCARDIOGRAM (TEE);  Surgeon: [name], MD;  Location: [facility];  Service: Open Heart Surgery;  Laterality: N/A;  . TRANSTHORACIC ECHOCARDIOGRAM  03/22/2006; 05/2016; 07/2016   2008: EF 65%, normal valves, no wall motion abnormalties, LA size normal.  05/2016 in context of non-STEMI, EF 20-25%, mild AV and MV regurg.  08/05/16: EF 30-35%, akinesis of mid lateral myoc, grd II DD, mild aortic regurg.    Family History  Problem Relation Age of Onset  . Hypertension Mother   . Cancer - Other Mother 21       breast  . Cancer Father        Lung ca age 4  . Diabetes Sister     Social History   Socioeconomic History  . Marital status: Married    Spouse name: Not on file  . Number of children: Not on file  . Years of education: Not on file  . Highest education level: Not on file  Occupational History  . Not on file  Social Needs  . Financial resource strain: Not on file  . Food insecurity:    Worry: Not on file    Inability: Not on file  . Transportation needs:    Medical: Not on file    Non-medical: Not on file  Tobacco Use  . Smoking status: Former Smoker    Packs/day: 1.00    Years: 55.00    Pack years: 55.00    Types: Cigarettes  . Smokeless tobacco: Never Used  . Tobacco comment: STOPPED AT SURGERY  Substance and Sexual Activity  . Alcohol use: No  . Drug use: No  . Sexual activity: Not on file  Lifestyle  . Physical activity:    Days per week: Not on file    Minutes per session: Not on file  . Stress: Not on file  Relationships  . Social connections:     Talks on phone: Not on file    Gets together: Not on file    Attends religious service: Not on file    Active member of club or organization: Not on file    Attends meetings of clubs or organizations: Not on file    Relationship status: Not on file  . Intimate partner violence:    Fear of current or ex partner: Not on file    Emotionally abused: Not on file    Physically abused: Not on file    Forced sexual activity: Not on file  Other Topics Concern  . Not on file  Social History  Narrative   Married, 2 grown children.   HS education.  Orig from [place], lives there now.   Occupation: maintenance.  Drives a tractor a lot, drives a pickup truck a lot, rides horses a lot.   Tobacco: 20 pack-yr hx (current as of 07/2012)   No drugs.   Alcohol: none    Outpatient Medications Prior to Visit  Medication Sig Dispense Refill  . ALPRAZolam (XANAX) 0.25 MG tablet TAKE ONE TABLET BY MOUTH AT BEDTIME AS NEEDED 30 tablet 5  . aspirin 81 MG tablet Take 1 tablet (81 mg total) by mouth daily.    . benazepril (LOTENSIN) 40 MG tablet Take 40 mg by mouth daily as needed.    . carvedilol (COREG) 6.25 MG tablet Take 1 tablet (6.25 mg total) by mouth 2 (two) times daily with a meal. 180 tablet 3  . furosemide (LASIX) 20 MG tablet Take 20mg  (1 tablet) as needed for swelling. (Patient taking differently: Take 20 mg by mouth daily as needed for fluid or edema. ) 30 tablet 3  . hydrochlorothiazide (HYDRODIURIL) 25 MG tablet Take 1 tablet (25 mg total) by mouth daily. 90 tablet 1  . Omega-3 Fatty Acids (FISH OIL) 1000 MG CAPS Take 1 capsule by mouth daily.    . Vitamin D, Cholecalciferol, 400 units CAPS Take 400 Units by mouth daily.      No facility-administered medications prior to visit.     Allergies  Allergen Reactions  . Entresto [Sacubitril-Valsartan] Hives and Shortness Of Breath  . Amlodipine Swelling    LE swelling  . Contrast Media [Iodinated Diagnostic Agents] Other (See Comments)     Prostate problem had to wear a catheter for two weeks after having dye.   [name] Hydralazine Palpitations and Other (See Comments)    Fatigue+    ROS Review of Systems  Constitutional: Negative for appetite change, chills, fatigue and fever.  HENT: Negative for congestion, dental problem, ear pain and sore throat.   Eyes: Negative for discharge, redness and visual disturbance.  Respiratory: Negative for cough, chest tightness, shortness of breath and wheezing.   Cardiovascular: Negative for chest pain, palpitations and leg swelling.  Gastrointestinal: Negative for abdominal pain, blood in stool, diarrhea, nausea and vomiting.  Genitourinary: Negative for difficulty urinating, dysuria, flank pain, frequency, hematuria and urgency.  Musculoskeletal: Negative for arthralgias, back pain, joint swelling, myalgias and neck stiffness.  Skin: Negative for pallor and rash.  Neurological: Negative for dizziness, speech difficulty, weakness and headaches.  Hematological: Negative for adenopathy. Does not bruise/bleed easily.  Psychiatric/Behavioral: Negative for confusion and sleep disturbance. The patient is not nervous/anxious.     PE; Blood pressure (!) 163/88, pulse (!) 48, temperature 98.3 F (36.8 C), temperature source Oral, resp. rate 16, height 5' 9.5" (1.765 m), weight 170 lb 4 oz (77.2 kg), SpO2 99 %. Body mass index is 24.78 kg/m.  Gen: Alert, well appearing.  Patient is oriented to person, place, time, and situation. AFFECT: pleasant, lucid thought and speech. ENT: Ears: EACs clear, normal epithelium.  TMs with good light reflex and landmarks bilaterally.  Eyes: no injection, icteris, swelling, or exudate.  EOMI, PERRLA. Nose: no drainage or turbinate edema/swelling.  No injection or focal lesion.  Mouth: lips without lesion/swelling.  Oral mucosa pink and moist.  Dentures in place.  Oropharynx without erythema, exudate, or swelling.  Neck: supple/nontender.  No LAD, mass, or TM.  Carotid  pulses 2+ bilaterally, without bruits. CV: RRR with occ ectopic beat, no m/r/g.  LUNGS: CTA bilat, nonlabored resps, good aeration in all lung fields. ABD: soft, NT, ND, BS normal.  No hepatospenomegaly or mass.  No bruits. EXT: no clubbing, cyanosis, or edema.  Musculoskeletal: no joint swelling, erythema, warmth, or tenderness.  ROM of all joints intact. Skin - no sores or suspicious lesions or rashes or color changes   Pertinent labs:  Lab Results  Component Value Date   TSH 1.181 05/16/2016   Lab Results  Component Value Date   WBC 5.0 09/16/2016   HGB 15.4 09/16/2016   HCT 45.6 09/16/2016   MCV 90 09/16/2016   PLT 186 09/16/2016   Lab Results  Component Value Date   CREATININE 0.95 06/20/2017   BUN 19 06/20/2017   NA 140 06/20/2017   K 3.7 06/20/2017   CL 105 06/20/2017   CO2 27 06/20/2017   Lab Results  Component Value Date   ALT 11 09/16/2016   AST 20 09/16/2016   ALKPHOS 86 09/16/2016   BILITOT 0.8 09/16/2016   Lab Results  Component Value Date   CHOL 137 09/16/2016   Lab Results  Component Value Date   HDL 35 (L) 09/16/2016   Lab Results  Component Value Date   LDLCALC 88 09/16/2016   Lab Results  Component Value Date   TRIG 68 09/16/2016   Lab Results  Component Value Date   CHOLHDL 3.9 09/16/2016   Lab Results  Component Value Date   PSA <0.02 10/15/2015   PSA 17.82 (H) 12/22/2014   PSA 13.92 (H) 11/14/2014    ASSESSMENT AND PLAN:   1) Uncontrolled HTN: goal 135/85-->he feels bad if bp lower. Increase benazepril to a WHOLE 40mg  tab qd. Continue daily bp and hr checks  2)Health maintenance exam: Reviewed age and gender appropriate health maintenance issues (prudent diet, regular exercise, health risks of tobacco and excessive alcohol, use of seatbelts, fire alarms in home, use of sunscreen).  Also reviewed age and gender appropriate health screening as well as vaccine recommendations. Vaccines: Pneumonia vaccinespt declines.   Shingrix discussed--pt declines. Labs: CBC, CMET, FLP. Prostate ca screening: n/a-->pt is s/p prostatectomy for prostate cancer, followed by urology--last PSA 03/2017 undetectable. Colon ca screening: next colonoscopy due 2021.  An After Visit Summary was printed and given to the patient.  FOLLOW UP:  Return in about 2 weeks (around 07/26/2017) for f/u HTN.  Signed:  Crissie Sickles, MD           07/12/2017

## 2017-07-14 ENCOUNTER — Telehealth: Payer: Self-pay

## 2017-07-14 NOTE — Telephone Encounter (Signed)
Copied from [place] [id number]. Topic: General - Other >> Jul 14, 2017  8:18 AM Oneta Rack wrote:  Relation to pt: self Call back number: [phone]   Reason for call:  Patient returning call regarding the message below, please advise   Notes recorded by Gordy Councilman, CMA on 07/14/2017 at 8:09 AM EDT Patient notified of lab results, expressed understanding. ------  Notes recorded by Gordy Councilman, CMA on 07/13/2017 at 10:01 AM EDT Left message on pt's phone to [place].

## 2017-07-14 NOTE — Telephone Encounter (Signed)
Message left on patients voice mail to call back if any questions.

## 2017-07-25 ENCOUNTER — Ambulatory Visit: Payer: Medicare Other | Admitting: Family Medicine

## 2017-07-25 ENCOUNTER — Encounter: Payer: Self-pay | Admitting: Family Medicine

## 2017-07-25 VITALS — BP 167/90 | HR 48 | Temp 98.0°F | Resp 16 | Ht 69.5 in | Wt 171.2 lb

## 2017-07-25 DIAGNOSIS — I10 Essential (primary) hypertension: Secondary | ICD-10-CM | POA: Diagnosis not present

## 2017-07-25 DIAGNOSIS — Z8639 Personal history of other endocrine, nutritional and metabolic disease: Secondary | ICD-10-CM

## 2017-07-25 LAB — BASIC METABOLIC PANEL
BUN: 20 mg/dL (ref 6–23)
CALCIUM: 9.3 mg/dL (ref 8.4–10.5)
CHLORIDE: 104 meq/L (ref 96–112)
CO2: 29 mEq/L (ref 19–32)
CREATININE: 0.94 mg/dL (ref 0.40–1.50)
GFR: 83.79 mL/min (ref >60.00)
Glucose, Bld: 106 mg/dL — ABNORMAL HIGH (ref 70–99)
Potassium: 3.9 mEq/L (ref 3.5–5.1)
Sodium: 139 mEq/L (ref 135–145)

## 2017-07-25 NOTE — Progress Notes (Signed)
OFFICE VISIT  07/25/2017   CC:  Chief Complaint  Patient presents with  . Follow-up    HTN, pt is not fasting.      HPI:    Patient is a 72 y.o. Caucasian male who presents for 2 wk f/u HTN. Last visit I increased his benazepril to a whole 40mg  tab qd.  HTN: home bp's avg high 130s over 78-80.  He increased his coreg back to a whole 6.25mg  tab twice a day and feels much better, no more weakness in legs. No requirement for fluid pill lately.  Due for repeat echo in 2 wks.  ROS: no CP, no SOB, no focal weakness, no dizziness, no palpitations.    Past Medical History:  Diagnosis Date  . Anxiety   . Atrial fibrillation ([place])    Limited episode, emergency room, June, 2013, spontaneous conversion to sinus rhythm, was evaluated By [name] 2013- no further visits.  Post-op (CABG) a-fib, converted on amio but pt self d/c'd this med due to DOE/side effect profile.  [name] BPH (benign prostatic hypertrophy) with urinary obstruction    Nocturia is his primary symptom.  Acute urinary retention occurred when he got CT with contrast fall 2016 ([name]).  [name] CAD (coronary artery disease) 05/2016   a. 05/2016: NSTEMI with cath showing 3-vessel disease. Underwent CABG on 05/12/16 with LIMA-D1, SVG-LAD, and SVG-PDA.   [name] COPD (chronic obstructive pulmonary disease) ([place]) fall 2016   Bullous changes noted on lower lung images of CT abd done by urology  . Diverticulitis   . GERD (gastroesophageal reflux disease)   . Has received pneumococcal vaccination   . Hyperlipidemia    statin intolerant  . Hypertension   . Ischemic cardiomyopathy 05/2016   EF 20-25% at the time of NSTEMI/CABG.  Repeat EF 07/2016 30-35%.  ACE-I and potassium stopped and entresto and aldactone started 09/11/16>>then hyperkalemia so aldactone d/c'd.  . Microscopic hematuria Fall 2016   CT nl except nonobstructing stones.  Cystoscopy normal 12/30/14 ([name])  . Nephrolithiasis    11 mm stone on R, 2 mm stone on L, ureters  clear  . Non-STEMI (non-ST elevated myocardial infarction) ([place]) 05/2016   with impaired LV function  . 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  Urol ([name]) at [facility]; [name] referred him to [name] 03/2015: patient got bilat nerve sparing , robot assisted laparoscopic radical prostatectomy and pelvic lymphadenectomy.  Urol f/u, PSA undetectable on repeat 04/22/16 and 10/28/16; pt chose PSA surveill over adjuv rad tx.   . Tobacco dependence    down to 1-2 cigs per day as of 11/2015.  Restarted 08/2016.  [name] Urinary retention 05/2015   occurred s/p foley removal    Past Surgical History:  Procedure Laterality Date  . aortic ultrasound  07/2012   NO AAA  . COLONOSCOPY  approx 2011 per pt   Done by surgeon in [place] after a bout of diverticulitis; normal per pt report--was told to repeat in 10 yrs.  . CORONARY ARTERY BYPASS GRAFT N/A 05/12/2016   Procedure: CORONARY ARTERY BYPASS GRAFTING (CABG) TIMES THREE USING LEFT INTERNAL MAMMARY ARTERY AND RIGHT SAPHENOUS LEG VEIN HARVESTED ENDOSCOPICALLY;  Surgeon: [name], MD;  Location: [facility];  Service: Open Heart Surgery;  Laterality: N/A;  . INGUINAL HERNIA REPAIR  2003 and 2004   Both sides.  [name] LEFT HEART CATH AND CORONARY ANGIOGRAPHY N/A 05/09/2016   Procedure: Left Heart  Cath and Coronary Angiography;  Surgeon: [name], MD;  Location: [facility];  Service: Cardiovascular;  Laterality: N/A;  . LITHOTRIPSY  2004   Dr. Maryland Pink in [place].  [name] LYMPHADENECTOMY Bilateral 04/30/2015   Procedure: PELVIC LYMPHADENECTOMY;  Surgeon: [name], MD;  Location: [facility];  Service: Urology;  Laterality: Bilateral;  . PROSTATE BIOPSY  02/05/15   Prostate adenocarcinoma: pt considering treatment options as of 02/19/15  . ROBOT ASSISTED LAPAROSCOPIC RADICAL PROSTATECTOMY N/A 04/30/2015   Procedure: XI ROBOTIC ASSISTED LAPAROSCOPIC RADICAL PROSTATECTOMY LEVEL 2;  Surgeon: [name],  MD;  [facility];  Service: Urology;  Laterality: N/A;  . TEE WITHOUT CARDIOVERSION N/A 05/12/2016   Procedure: TRANSESOPHAGEAL ECHOCARDIOGRAM (TEE);  Surgeon: [name], MD;  Location: [facility];  Service: Open Heart Surgery;  Laterality: N/A;  . TRANSTHORACIC ECHOCARDIOGRAM  03/22/2006; 05/2016; 07/2016   2008: EF 65%, normal valves, no wall motion abnormalties, LA size normal.  05/2016 in context of non-STEMI, EF 20-25%, mild AV and MV regurg.  08/05/16: EF 30-35%, akinesis of mid lateral myoc, grd II DD, mild aortic regurg.    Outpatient Medications Prior to Visit  Medication Sig Dispense Refill  . ALPRAZolam (XANAX) 0.25 MG tablet TAKE ONE TABLET BY MOUTH AT BEDTIME AS NEEDED 30 tablet 5  . aspirin 81 MG tablet Take 1 tablet (81 mg total) by mouth daily.    . benazepril (LOTENSIN) 40 MG tablet Take 1 tablet (40 mg total) by mouth daily as needed. 30 tablet 6  . carvedilol (COREG) 6.25 MG tablet Take 1 tablet (6.25 mg total) by mouth 2 (two) times daily with a meal. 180 tablet 3  . furosemide (LASIX) 20 MG tablet Take 20mg  (1 tablet) as needed for swelling. (Patient taking differently: Take 20 mg by mouth daily as needed for fluid or edema. ) 30 tablet 3  . hydrochlorothiazide (HYDRODIURIL) 25 MG tablet Take 1 tablet (25 mg total) by mouth daily. 90 tablet 1  . Omega-3 Fatty Acids (FISH OIL) 1000 MG CAPS Take 1 capsule by mouth daily.    . Vitamin D, Cholecalciferol, 400 units CAPS Take 400 Units by mouth daily.      No facility-administered medications prior to visit.     Allergies  Allergen Reactions  . Entresto [Sacubitril-Valsartan] Hives and Shortness Of Breath  . Amlodipine Swelling    LE swelling  . Contrast Media [Iodinated Diagnostic Agents] Other (See Comments)    Prostate problem had to wear a catheter for two weeks after having dye.   [name] Hydralazine Palpitations and Other (See Comments)    Fatigue+    ROS As per HPI  PE: Blood pressure (!) 167/90, pulse (!) 48,  temperature 98 F (36.7 C), temperature source Oral, resp. rate 16, height 5' 9.5" (1.765 m), weight 171 lb 4 oz (77.7 kg), SpO2 99 %. Gen: Alert, well appearing.  Patient is oriented to person, place, time, and situation. AFFECT: pleasant, lucid thought and speech. CV: RRR, no m/r/g.   LUNGS: CTA bilat, nonlabored resps, good aeration in all lung fields. EXT: no clubbing, cyanosis, or edema.    LABS:    Chemistry      Component Value Date/Time   NA 137 07/12/2017 0834   NA 142 12/14/2016 0942   K 4.3 07/12/2017 0834   CL 100 07/12/2017 0834   CO2 31 07/12/2017 0834   BUN 11 07/12/2017 0834   BUN 15 12/14/2016 0942   CREATININE 0.95 07/12/2017 3329  CREATININE 0.95 06/20/2017 1436      Component Value Date/Time   CALCIUM 9.9 07/12/2017 0834   ALKPHOS 65 07/12/2017 0834   AST 19 07/12/2017 0834   ALT 12 07/12/2017 0834   BILITOT 1.0 07/12/2017 0834   BILITOT 0.8 09/16/2016 0858       IMPRESSION AND PLAN:  No problem-specific Assessment & Plan notes found for this encounter. HTN: good/adequate control now--he feels good where bp and HR are at now, feels bad if any lower. Continue 40mg  benazepril and 6.25mg  coreg bid. BMET today to monitor K and Cr since recent increase in benazepril.  An After Visit Summary was printed and given to the patient.  FOLLOW UP: No follow-ups on file.  Signed:  Crissie Sickles, MD           07/25/2017

## 2017-08-07 ENCOUNTER — Other Ambulatory Visit: Payer: Self-pay

## 2017-08-07 ENCOUNTER — Ambulatory Visit (HOSPITAL_COMMUNITY): Payer: Medicare Other | Attending: Cardiology

## 2017-08-07 DIAGNOSIS — Z87891 Personal history of nicotine dependence: Secondary | ICD-10-CM | POA: Insufficient documentation

## 2017-08-07 DIAGNOSIS — I252 Old myocardial infarction: Secondary | ICD-10-CM | POA: Diagnosis not present

## 2017-08-07 DIAGNOSIS — E785 Hyperlipidemia, unspecified: Secondary | ICD-10-CM | POA: Diagnosis not present

## 2017-08-07 DIAGNOSIS — I255 Ischemic cardiomyopathy: Secondary | ICD-10-CM | POA: Diagnosis not present

## 2017-08-07 DIAGNOSIS — I2510 Atherosclerotic heart disease of native coronary artery without angina pectoris: Secondary | ICD-10-CM | POA: Insufficient documentation

## 2017-08-07 DIAGNOSIS — I10 Essential (primary) hypertension: Secondary | ICD-10-CM | POA: Insufficient documentation

## 2017-08-07 DIAGNOSIS — I4891 Unspecified atrial fibrillation: Secondary | ICD-10-CM | POA: Diagnosis not present

## 2017-08-07 DIAGNOSIS — I480 Paroxysmal atrial fibrillation: Secondary | ICD-10-CM | POA: Insufficient documentation

## 2017-08-07 DIAGNOSIS — R001 Bradycardia, unspecified: Secondary | ICD-10-CM | POA: Diagnosis not present

## 2017-08-18 ENCOUNTER — Telehealth: Payer: Self-pay | Admitting: Cardiology

## 2017-08-18 NOTE — Telephone Encounter (Signed)
New Message   Pt returning call for nurse regarding echo results. Please call

## 2017-08-18 NOTE — Telephone Encounter (Signed)
Notes recorded by [name], MD on 08/10/2017 at 10:41 AM EDT His EF remains low. I would like to see him back in the next couple of weeks to discuss further med titration. I will also need to begin conversation about an ICD. Call Mr. Blank with the results and send results to McGowen, Adrian Blackwater, MD

## 2017-08-18 NOTE — Telephone Encounter (Signed)
Spoke with wife as pt was not home.  Pt is aware he needs to be seen in f/u of this testing and for medication titration.  appt scheduled for 7/24 at 2:30 in [place] office with Dr Percival Spanish.  Wife stated understanding and will notify pt when he returns home.

## 2017-08-21 ENCOUNTER — Encounter: Payer: Self-pay | Admitting: Family Medicine

## 2017-08-22 NOTE — Progress Notes (Signed)
Cardiology Office Note   Date:  08/23/2017   ID:  Daniel [name]., DOB 1945/06/05, MRN [id number]  PCP:  [name], MD  Cardiologist:   No primary care provider on file.   Chief Complaint  Patient presents with  . Fatigue      History of Present Illness: Daniel [name]. is a 72 y.o. male who presents for follow up of CAD s/p CABG LIMA to D1, SVG to LAD, SVG to PDA on 05/12/2016, HLD intolerant to statin, HTN, ICM with baseline EF 30-35%, and h/o prostate CA.His last cardiac catheterization on 05/09/2016 showed EF 20-25%, akinesis of the distal inferoapical segment with significant hypokinesis globally consistent with ischemiccardiomyopathy, multivessel disease with 70% followed by total occlusion of large LAD with distal collateral arteries, 50% proximal left circumflex stenosis with total occlusion of distal left circumflex, total occlusion of mid RCA with distal collateral artery. Bypass surgery was recommended and he eventually underwent successful CABG 3 by Dr. Roxan Hockey on 05/12/2016 with LIMA to diagonal, SVG to LAD, SVG to PDA. He developed postoperative atrial fibrillation and was treated with IV amiodarone with conversion to sinus rhythm prior to discharge. He had a follow-up echocardiogram on 08/05/2016 which revealed LV EF remains low at 30-35% however slightly improved from his previous 20-25%.  He was placed on carvedilol and Entresto, spironolactone was discontinued due to elevated potassium level. Unfortunately, he went back to the hospital on 11/20/2016 with shortness breath, diffuse rash and presyncope. His blood pressure as in the 70s by home blood pressure cuff. He was given prednisone for his rash. He stopped the [facility] and restarted ACE inhibitor as he was sure the rash was related to the former.    I did repeat an echo earlier this month.  I did increased his Coreg at the last visit and followed up with an echo.   His EF was still low at 25 - 30%.  I brought him  back to discuss this.    He stopped taking his enalapril on his own because he was having lower blood pressures.  He does not describe presyncope or syncope.  He does get stressed.  He feels some fatigue.  However, he still works routinely doing some Dealer work.  He is under emotional stress because his wife is undergoing breast cancer therapy.  He says he actually feels fairly well and denies any shortness of breath, PND or orthopnea.  He has had no chest pressure, neck or arm discomfort.  He has had no weight gain or edema.  Past Medical History:  Diagnosis Date  . Anxiety   . Atrial fibrillation ([place])    Limited episode, emergency room, June, 2013, spontaneous conversion to sinus rhythm, was evaluated By [name] 2013- no further visits.  Post-op (CABG) a-fib, converted on amio but pt self d/c'd this med due to DOE/side effect profile.  [name] BPH (benign prostatic hypertrophy) with urinary obstruction    Nocturia is his primary symptom.  Acute urinary retention occurred when he got CT with contrast fall 2016 ([name]).  [name] CAD (coronary artery disease) 05/2016   a. 05/2016: NSTEMI with cath showing 3-vessel disease. Underwent CABG on 05/12/16 with LIMA-D1, SVG-LAD, and SVG-PDA.   [name] COPD (chronic obstructive pulmonary disease) ([place]) fall 2016   Bullous changes noted on lower lung images of CT abd done by urology  . Diverticulitis   . GERD (gastroesophageal reflux disease)   . Has received pneumococcal vaccination   .  Hyperlipidemia    statin intolerant  . Hypertension   . Ischemic cardiomyopathy 05/2016   EF 20-25% at the time of NSTEMI/CABG.  Repeat EF 07/2016 30-35%.  ACE-I and potassium stopped and entresto and aldactone started 09/11/16>>then hyperkalemia so aldactone d/c'd.  07/2017 echo EF still 25-30%--Dr. Faaris Arizpe to discuss ICD and med titration as of latter part of July 2019.  . Microscopic hematuria Fall 2016   CT nl except nonobstructing stones.  Cystoscopy normal 12/30/14 ([name])   . Nephrolithiasis    11 mm stone on R, 2 mm stone on L, ureters clear  . Non-STEMI (non-ST elevated myocardial infarction) ([place]) 05/2016   with impaired LV function  . Prostatic adenocarcinoma (Daniel [name]) 02/2015   No evidence of metastatic dz on CT pelv 02/2015.  Urol ([name]) at [facility]; [name] referred him to [name] 03/2015: patient got bilat nerve sparing , robot assisted laparoscopic radical prostatectomy and pelvic lymphadenectomy.  Urol f/u, PSA undetectable on repeat 04/22/16 and 10/28/16; pt chose PSA surveill over adjuv rad tx.   . Tobacco dependence    down to 1-2 cigs per day as of 11/2015.  Restarted 08/2016.  [name] Urinary retention 05/2015   occurred s/p foley removal    Past Surgical History:  Procedure Laterality Date  . aortic ultrasound  07/2012   NO AAA  . COLONOSCOPY  approx 2011 per pt   Done by surgeon in [place] after a bout of diverticulitis; normal per pt report--was told to repeat in 10 yrs.  . CORONARY ARTERY BYPASS GRAFT N/A 05/12/2016   Procedure: CORONARY ARTERY BYPASS GRAFTING (CABG) TIMES THREE USING LEFT INTERNAL MAMMARY ARTERY AND RIGHT SAPHENOUS LEG VEIN HARVESTED ENDOSCOPICALLY;  Surgeon: [name], MD;  Location: [facility];  Service: Open Heart Surgery;  Laterality: N/A;  . INGUINAL HERNIA REPAIR  2003 and 2004   Both sides.  [name] LEFT HEART CATH AND CORONARY ANGIOGRAPHY N/A 05/09/2016   Procedure: Left Heart Cath and Coronary Angiography;  Surgeon: [name], MD;  Location: [facility];  Service: Cardiovascular;  Laterality: N/A;  . LITHOTRIPSY  2004   Dr. Maryland Pink in [place].  [name] LYMPHADENECTOMY Bilateral 04/30/2015   Procedure: PELVIC LYMPHADENECTOMY;  Surgeon: [name], MD;  [facility];  Service: Urology;  Laterality: Bilateral;  . PROSTATE BIOPSY  02/05/15   Prostate adenocarcinoma: pt considering treatment options as of 02/19/15  . ROBOT ASSISTED LAPAROSCOPIC RADICAL PROSTATECTOMY N/A 04/30/2015   Procedure: XI ROBOTIC ASSISTED  LAPAROSCOPIC RADICAL PROSTATECTOMY LEVEL 2;  Surgeon: [name], MD;  [facility];  Service: Urology;  Laterality: N/A;  . TEE WITHOUT CARDIOVERSION N/A 05/12/2016   Procedure: TRANSESOPHAGEAL ECHOCARDIOGRAM (TEE);  Surgeon: [name], MD;  Location: [facility];  Service: Open Heart Surgery;  Laterality: N/A;  . TRANSTHORACIC ECHOCARDIOGRAM  03/22/2006; 05/2016; 07/2016; 07/2017   2008: EF 65%, normal valves, no wall motion abnormalties, LA size normal.  05/2016 in context of non-STEMI, EF 20-25%, mild AV and MV regurg.  08/05/16: EF 30-35%, akinesis of mid lateral myoc, grd II DD, mild aortic 07/2017: EF 25-30%, mult areas of akinesis, grd I DD.     Current Outpatient Medications  Medication Sig Dispense Refill  . ALPRAZolam (XANAX) 0.25 MG tablet TAKE ONE TABLET BY MOUTH AT BEDTIME AS NEEDED 30 tablet 5  . aspirin 81 MG tablet Take 1 tablet (81 mg total) by mouth daily.    . carvedilol (COREG) 6.25 MG tablet Take 1 tablet (6.25 mg  total) by mouth 2 (two) times daily with a meal. 180 tablet 3  . Omega-3 Fatty Acids (FISH OIL) 1000 MG CAPS Take 1 capsule by mouth daily.    . Vitamin D, Cholecalciferol, 400 units CAPS Take 400 Units by mouth daily.     . benazepril (LOTENSIN) 40 MG tablet Take 1 tablet (40 mg total) by mouth daily as needed. (Patient not taking: Reported on 08/23/2017) 30 tablet 6  . furosemide (LASIX) 20 MG tablet Take 20mg  (1 tablet) as needed for swelling. (Patient not taking: Reported on 08/23/2017) 30 tablet 3   No current facility-administered medications for this visit.     Allergies:   Entresto [sacubitril-valsartan]; Amlodipine; Contrast media [iodinated diagnostic agents]; and Hydralazine    ROS:  Please see the history of present illness.   Otherwise, review of systems are positive for none.   All other systems are reviewed and negative.    PHYSICAL EXAM: VS:  BP (!) 158/80   Pulse 60   Ht 5\' 11"  (1.803 m)   Wt 171 lb (77.6 kg)   BMI 23.85 kg/m  , BMI  Body mass index is 23.85 kg/m.  GENERAL:  Well appearing NECK:  No jugular venous distention, waveform within normal limits, carotid upstroke brisk and symmetric, no bruits, no thyromegaly LUNGS:  Clear to auscultation bilaterally CHEST:  Well healed sternotomy scar. HEART:  PMI not displaced or sustained,S1 and S2 within normal limits, no S3, no S4, no clicks, no rubs, no murmurs ABD:  Flat, positive bowel sounds normal in frequency in pitch, no bruits, no rebound, no guarding, no midline pulsatile mass, no hepatomegaly, no splenomegaly EXT:  2 plus pulses throughout, no edema, no cyanosis no clubbing    EKG:  EKG is not ordered today.    Recent Labs: 07/12/2017: ALT 12; Hemoglobin 16.7; Platelets 167.0 07/25/2017: BUN 20; Creatinine, Ser 0.94; Potassium 3.9; Sodium 139    Lipid Panel    Component Value Date/Time   CHOL 167 07/12/2017 0834   CHOL 137 09/16/2016 0858   TRIG 74.0 07/12/2017 0834   HDL 34.90 (L) 07/12/2017 0834   HDL 35 (L) 09/16/2016 0858   CHOLHDL 5 07/12/2017 0834   VLDL 14.8 07/12/2017 0834   LDLCALC 117 (H) 07/12/2017 0834   LDLCALC 88 09/16/2016 0858      Wt Readings from Last 3 Encounters:  08/23/17 171 lb (77.6 kg)  07/25/17 171 lb 4 oz (77.7 kg)  07/12/17 170 lb 4 oz (77.2 kg)      Other studies Reviewed: Additional studies/ records that were reviewed today include: Labs Review of the above records demonstrates:    ASSESSMENT AND PLAN:  CAD s/p CABG:    The patient has no new sypmtoms.  No further cardiovascular testing is indicated.  We will continue with aggressive risk reduction and meds as listed.  Ischemic cardiomyopathy:   EF remains low at 25 - 30%.    I would really like him to be on an ACE inhibitor.  We are going to list and Delene Loll as an allergy.  He understands that he should try to tolerate a slightly low heart rate and blood pressure.  He agrees to restarting benazepril and I will start at half the previous dose 20 mg.  He will  stop his hydrochlorothiazide.  For now given what his wife is going through and the fact that we are still titrating meds we will consider an ICD although we began to talk about it.  He knows  I will bring this up again in the future.  Hypertension:    This is being managed in the context of treating his CHF  Hyperlipidemia:       Lipids as above.  He was on Lipitor previously but did not tolerate high doses.  I will try to convince him to take a statin in the future but for now go to work on one medication at a time.    PAF:  He has had no recurrence of this.  No change in therapy.   Tobacco :   He is not smoking.    Current medicines are reviewed at length with the patient today.  The patient does not have concerns regarding medicines.  The following changes have been made:  As above  Labs/ tests ordered today include: None No orders of the defined types were placed in this encounter.    Disposition:   FU with me in 2 months.      Signed, [name], MD  08/23/2017 3:29 PM    [facility]

## 2017-08-23 ENCOUNTER — Encounter: Payer: Self-pay | Admitting: Cardiology

## 2017-08-23 ENCOUNTER — Ambulatory Visit: Payer: Medicare Other | Admitting: Cardiology

## 2017-08-23 VITALS — BP 158/80 | HR 60 | Ht 71.0 in | Wt 171.0 lb

## 2017-08-23 DIAGNOSIS — I10 Essential (primary) hypertension: Secondary | ICD-10-CM

## 2017-08-23 DIAGNOSIS — E785 Hyperlipidemia, unspecified: Secondary | ICD-10-CM | POA: Diagnosis not present

## 2017-08-23 DIAGNOSIS — I25810 Atherosclerosis of coronary artery bypass graft(s) without angina pectoris: Secondary | ICD-10-CM

## 2017-08-23 DIAGNOSIS — I5022 Chronic systolic (congestive) heart failure: Secondary | ICD-10-CM

## 2017-08-23 NOTE — Patient Instructions (Signed)
Medication Instructions:  Please discontinue your HCTZ. Restart your Benazepril today. Continue all other medications as listed  Follow-Up: Follow up in 2 months with Dr. Warren Lacy.    If you need a refill on your cardiac medications before your next appointment, please call your pharmacy.  Thank you for choosing [facility]!!

## 2017-08-29 ENCOUNTER — Encounter: Payer: Self-pay | Admitting: Family Medicine

## 2017-09-14 ENCOUNTER — Other Ambulatory Visit: Payer: Self-pay | Admitting: Family Medicine

## 2017-10-17 NOTE — Progress Notes (Signed)
Cardiology Office Note   Date:  10/18/2017   ID:  Daniel [name]., DOB Feb 11, 1945, MRN [id number]  PCP:  [name], MD  Cardiologist:   No primary care provider on file.   Chief Complaint  Patient presents with  . Cardiomyopathy      History of Present Illness: Daniel [name]. is a 72 y.o. male who presents for follow up of CAD s/p CABG LIMA to D1, SVG to LAD, SVG to PDA on 05/12/2016, HLD intolerant to statin, HTN, ICM with baseline EF 30-35%, and h/o prostate CA.His last cardiac catheterization on 05/09/2016 showed EF 20-25%, akinesis of the distal inferoapical segment with significant hypokinesis globally consistent with ischemiccardiomyopathy, multivessel disease with 70% followed by total occlusion of large LAD with distal collateral arteries, 50% proximal left circumflex stenosis with total occlusion of distal left circumflex, total occlusion of mid RCA with distal collateral artery. Bypass surgery was recommended and he eventually underwent successful CABG 3 by Dr. Roxan Hockey on 05/12/2016 with LIMA to diagonal, SVG to LAD, SVG to PDA. He developed postoperative atrial fibrillation and was treated with IV amiodarone with conversion to sinus rhythm prior to discharge. He had a follow-up echocardiogram on 08/05/2016 which revealed LV EF remains low at 30-35% however slightly improved from his previous 20-25%.  He was placed on carvedilol and Entresto, spironolactone was discontinued due to elevated potassium level. Unfortunately, he went back to the hospital on 11/20/2016 with shortness breath, diffuse rash and presyncope. He was given prednisone for his rash. He stopped the [facility] and restarted ACE inhibitor as he was sure the rash was related to the former.  Last EF in July was 25 - 30%.    He is dealing with his wife is going through chemotherapy and he has to take her to the hospital frequently.  He does quite a bit of walking.  He denies any acute cardiovascular symptoms.   He has had some mild lower extremity swelling and is been taking his Lasix more frequently.  He denies any resting shortness of breath, PND or orthopnea.  Is not had any palpitations, presyncope or syncope.  He denies any chest pain.  His blood pressure has been higher recently.   Past Medical History:  Diagnosis Date  . Anxiety   . Atrial fibrillation ([place])    Limited episode, emergency room, June, 2013, spontaneous conversion to sinus rhythm, was evaluated By [name] 2013- no further visits.  Post-op (CABG) a-fib, converted on amio but pt self d/c'd this med due to DOE/side effect profile.  [name] BPH (benign prostatic hypertrophy) with urinary obstruction    Nocturia is his primary symptom.  Acute urinary retention occurred when he got CT with contrast fall 2016 ([name]).  [name] CAD (coronary artery disease) 05/2016   a. 05/2016: NSTEMI with cath showing 3-vessel disease. Underwent CABG on 05/12/16 with LIMA-D1, SVG-LAD, and SVG-PDA.   [name] COPD (chronic obstructive pulmonary disease) ([place]) fall 2016   Bullous changes noted on lower lung images of CT abd done by urology  . Diverticulitis   . GERD (gastroesophageal reflux disease)   . Has received pneumococcal vaccination   . Hyperlipidemia    statin intolerant  . Hypertension   . Ischemic cardiomyopathy 05/2016   EF 20-25% at the time of NSTEMI/CABG.  Repeat EF 07/2016 30-35%.  ACE-I and potassium stopped and entresto and aldactone started 09/11/16>>then hyperkalemia so aldactone d/c'd.  07/2017 echo EF still 25-30%--Dr. Brendyn Mclaren to began discussion of  ICD need and also started benazapril and d/c'd hctz in late July 2019.  . Microscopic hematuria Fall 2016   CT nl except nonobstructing stones.  Cystoscopy normal 12/30/14 ([name])  . Nephrolithiasis    11 mm stone on R, 2 mm stone on L, ureters clear  . Non-STEMI (non-ST elevated myocardial infarction) ([place]) 05/2016   with impaired LV function  . 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  Urol ([name]) at [facility]; [name] referred him to [name] 03/2015: patient got bilat nerve sparing , robot assisted laparoscopic radical prostatectomy and pelvic lymphadenectomy.  Urol f/u, PSA undetectable on repeat 04/22/16 and 10/28/16; pt chose PSA surveill over adjuv rad tx.   . Tobacco dependence    down to 1-2 cigs per day as of 11/2015.  Restarted 08/2016.  [name] Urinary retention 05/2015   occurred s/p foley removal    Past Surgical History:  Procedure Laterality Date  . aortic ultrasound  07/2012   NO AAA  . COLONOSCOPY  approx 2011 per pt   Done by surgeon in [place] after a bout of diverticulitis; normal per pt report--was told to repeat in 10 yrs.  . CORONARY ARTERY BYPASS GRAFT N/A 05/12/2016   Procedure: CORONARY ARTERY BYPASS GRAFTING (CABG) TIMES THREE USING LEFT INTERNAL MAMMARY ARTERY AND RIGHT SAPHENOUS LEG VEIN HARVESTED ENDOSCOPICALLY;  Surgeon: [name], MD;  Location: [place];  Service: Open Heart Surgery;  Laterality: N/A;  . INGUINAL HERNIA REPAIR  2003 and 2004   Both sides.  [name] LEFT HEART CATH AND CORONARY ANGIOGRAPHY N/A 05/09/2016   Procedure: Left Heart Cath and Coronary Angiography;  Surgeon: [name], MD;  Location: [place] [facility];  Service: Cardiovascular;  Laterality: N/A;  . LITHOTRIPSY  2004   Dr. [place] Pink in [place].  [name] LYMPHADENECTOMY Bilateral 04/30/2015   Procedure: PELVIC LYMPHADENECTOMY;  Surgeon: [name], MD;  [facility]: [facility];  Service: Urology;  Laterality: Bilateral;  . PROSTATE BIOPSY  02/05/15   Prostate adenocarcinoma: pt considering treatment options as of 02/19/15  . ROBOT ASSISTED LAPAROSCOPIC RADICAL PROSTATECTOMY N/A 04/30/2015   Procedure: XI ROBOTIC ASSISTED LAPAROSCOPIC RADICAL PROSTATECTOMY LEVEL 2;  Surgeon: [name], MD;  [facility];  Service: Urology;  Laterality: N/A;  . TEE WITHOUT CARDIOVERSION N/A 05/12/2016   Procedure: TRANSESOPHAGEAL ECHOCARDIOGRAM  (TEE);  Surgeon: [name], MD;  Location: [place];  Service: Open Heart Surgery;  Laterality: N/A;  . TRANSTHORACIC ECHOCARDIOGRAM  03/22/2006; 05/2016; 07/2016; 07/2017   2008: EF 65%, normal valves, no wall motion abnormalties, LA size normal.  05/2016 in context of non-STEMI, EF 20-25%, mild AV and MV regurg.  08/05/16: EF 30-35%, akinesis of mid lateral myoc, grd II DD, mild aortic 07/2017: EF 25-30%, mult areas of akinesis, grd I DD.     Current Outpatient Medications  Medication Sig Dispense Refill  . ALPRAZolam (XANAX) 0.25 MG tablet TAKE ONE TABLET BY MOUTH AT BEDTIME AS NEEDED 30 tablet 5  . aspirin 81 MG tablet Take 1 tablet (81 mg total) by mouth daily.    . benazepril (LOTENSIN) 40 MG tablet Take 40 mg by mouth 2 (two) times daily.    . carvedilol (COREG) 6.25 MG tablet Take 1 tablet (6.25 mg total) by mouth 2 (two) times daily with a meal. 180 tablet 3  . furosemide (LASIX) 20 MG tablet Take 20mg  (1 tablet) as needed for swelling. 30 tablet 3  .  Omega-3 Fatty Acids (FISH OIL) 1000 MG CAPS Take 1 capsule by mouth daily.    . Vitamin D, Cholecalciferol, 400 units CAPS Take 400 Units by mouth daily.     [name] spironolactone (ALDACTONE) 25 MG tablet Take 1 tablet (25 mg total) by mouth daily. 90 tablet 3   No current facility-administered medications for this visit.     Allergies:   Entresto [sacubitril-valsartan]; Amlodipine; Contrast media [iodinated diagnostic agents]; and Hydralazine    ROS:  Please see the history of present illness.   Otherwise, review of systems are positive for none.   All other systems are reviewed and negative.    PHYSICAL EXAM: VS:  BP (!) 166/101   Pulse (!) 59   Ht 5\' 11"  (1.803 m)   Wt 170 lb (77.1 kg)   BMI 23.71 kg/m  , BMI Body mass index is 23.71 kg/m.  GENERAL:  Well appearing NECK:  No jugular venous distention, waveform within normal limits, carotid upstroke brisk and symmetric, no bruits, no thyromegaly LUNGS:  Clear to auscultation  bilaterally CHEST:  Unremarkable HEART:  PMI not displaced or sustained,S1 and S2 within normal limits, no S3, no S4, no clicks, no rubs, no murmurs ABD:  Flat, positive bowel sounds normal in frequency in pitch, no bruits, no rebound, no guarding, no midline pulsatile mass, no hepatomegaly, no splenomegaly EXT:  2 plus pulses throughout, no edema, no cyanosis no clubbing    EKG:  EKG is done today Sinus rhythm, rate 56, premature atrial contractions, nonspecific T wave flattening.   Recent Labs: 07/12/2017: ALT 12; Hemoglobin 16.7; Platelets 167.0 07/25/2017: BUN 20; Creatinine, Ser 0.94; Potassium 3.9; Sodium 139    Lipid Panel    Component Value Date/Time   CHOL 167 07/12/2017 0834   CHOL 137 09/16/2016 0858   TRIG 74.0 07/12/2017 0834   HDL 34.90 (L) 07/12/2017 0834   HDL 35 (L) 09/16/2016 0858   CHOLHDL 5 07/12/2017 0834   VLDL 14.8 07/12/2017 0834   LDLCALC 117 (H) 07/12/2017 0834   LDLCALC 88 09/16/2016 0858      Wt Readings from Last 3 Encounters:  10/18/17 170 lb (77.1 kg)  08/23/17 171 lb (77.6 kg)  07/25/17 171 lb 4 oz (77.7 kg)      Other studies Reviewed: Additional studies/ records that were reviewed today include: Labs Review of the above records demonstrates:    ASSESSMENT AND PLAN:  CAD s/p CABG:   The patient has no new sypmtoms.  No further cardiovascular testing is indicated.  We will continue with aggressive risk reduction and meds as listed.  Ischemic cardiomyopathy:   EF remains low at 25 - 30%.   He needs to consider an ICD.    He would consider an ICD but would like to wait till after the first of the year when his wife is done with chemo.  We had a discussion about this today.  I will be adding Spironolactone 25 mg daily.  I will follow guidelines for spironolactone follow up in heart failure.  (Check potassium levels and  renal function 3--4 days and 1 week, then at least monthly for first 3 months and every 3 months thereafter after initiation  of spironolactone.)  Hypertension:    This is being managed in the context of treating his CHF.    Hyperlipidemia:       Lipids as above.  No change in therapy.   PAF:  He has had no symptomatic recurrence of this.  No  change in therapy.  Tobacco :   He is not smoking.   Current medicines are reviewed at length with the patient today.  The patient does not have concerns regarding medicines.  The following changes have been made: As above.   Labs/ tests ordered today include:   Orders Placed This Encounter  Procedures  . Basic metabolic panel  . Basic metabolic panel  . Basic metabolic panel  . EKG 12-Lead     Disposition:   FU with me in 2  months.      Signed, [name], MD  10/18/2017 2:42 PM     [facility]

## 2017-10-18 ENCOUNTER — Ambulatory Visit: Payer: Medicare Other | Admitting: Cardiology

## 2017-10-18 ENCOUNTER — Encounter: Payer: Self-pay | Admitting: Cardiology

## 2017-10-18 VITALS — BP 166/101 | HR 59 | Ht 71.0 in | Wt 170.0 lb

## 2017-10-18 DIAGNOSIS — I5022 Chronic systolic (congestive) heart failure: Secondary | ICD-10-CM

## 2017-10-18 DIAGNOSIS — Z79899 Other long term (current) drug therapy: Secondary | ICD-10-CM | POA: Diagnosis not present

## 2017-10-18 DIAGNOSIS — I10 Essential (primary) hypertension: Secondary | ICD-10-CM | POA: Diagnosis not present

## 2017-10-18 MED ORDER — SPIRONOLACTONE 25 MG PO TABS: 25.0000 mg | ORAL_TABLET | Freq: Every day | ORAL | 3 refills | Status: DC

## 2017-10-18 NOTE — Patient Instructions (Addendum)
Medication Instructions:  Please start Spironolactone 25 mg once a day. Continue all other medications as listed.  Labwork: Please have blood work in 1 week, 2 weeks later and 1 month after that - after starting Spironolactone.  Follow-Up: Follow up in 6 months with Dr. Percival Spanish.  You will receive a letter in the mail 2 months before you are due.  Please call us when you receive this letter to schedule your follow up appointment.  If you need a refill on your cardiac medications before your next appointment, please call your pharmacy.  Thank you for choosing [facility]!!

## 2017-10-25 ENCOUNTER — Other Ambulatory Visit: Payer: Medicare Other

## 2017-10-25 DIAGNOSIS — I255 Ischemic cardiomyopathy: Secondary | ICD-10-CM | POA: Diagnosis not present

## 2017-10-25 DIAGNOSIS — Z79899 Other long term (current) drug therapy: Secondary | ICD-10-CM | POA: Diagnosis not present

## 2017-10-25 LAB — BASIC METABOLIC PANEL
BUN/Creatinine Ratio: 14 (ref 10–24)
BUN: 14 mg/dL (ref 8–27)
CO2: 27 mmol/L (ref 20–29)
CREATININE: 0.98 mg/dL (ref 0.76–1.27)
Calcium: 9.6 mg/dL (ref 8.6–10.2)
Chloride: 102 mmol/L (ref 96–106)
GFR calc Af Amer: 89 mL/min/{1.73_m2} (ref >59)
GFR, EST NON AFRICAN AMERICAN: 77 mL/min/{1.73_m2} (ref >59)
Glucose: 87 mg/dL (ref 65–99)
POTASSIUM: 5 mmol/L (ref 3.5–5.2)
Sodium: 142 mmol/L (ref 134–144)

## 2017-11-01 ENCOUNTER — Telehealth: Payer: Self-pay | Admitting: Cardiovascular Disease

## 2017-11-01 NOTE — Telephone Encounter (Signed)
Spoke with pt wife. DPR on file. Pt wife aware of pt's lab results with verbal understanding. Notes recorded by [name], MD on 10/30/2017 at 8:38 PM EDT Labs OK. Call Mr. Shular with the results and send results to [place], Adrian Blackwater, MD  Results fwd to pcp

## 2017-11-01 NOTE — Telephone Encounter (Signed)
New Message ° ° °Patient is returning call in reference to lab results.  °

## 2017-11-03 ENCOUNTER — Encounter: Payer: Self-pay | Admitting: Family Medicine

## 2017-11-08 ENCOUNTER — Other Ambulatory Visit: Payer: Medicare Other

## 2017-11-20 ENCOUNTER — Encounter: Payer: Self-pay | Admitting: Family Medicine

## 2017-11-30 ENCOUNTER — Other Ambulatory Visit: Payer: Self-pay | Admitting: Family Medicine

## 2017-11-30 NOTE — Telephone Encounter (Signed)
RF request for alprazolam LOV: 07/25/17 Next ov: 01/26/18 Last written: 04/17/17 #30 w/ 5RF  Please advise. Thanks.

## 2017-12-06 ENCOUNTER — Other Ambulatory Visit: Payer: Medicare Other | Admitting: *Deleted

## 2017-12-06 ENCOUNTER — Encounter: Payer: Self-pay | Admitting: Family Medicine

## 2017-12-06 ENCOUNTER — Encounter (INDEPENDENT_AMBULATORY_CARE_PROVIDER_SITE_OTHER): Payer: Self-pay

## 2017-12-06 DIAGNOSIS — I5022 Chronic systolic (congestive) heart failure: Secondary | ICD-10-CM

## 2017-12-06 DIAGNOSIS — I10 Essential (primary) hypertension: Secondary | ICD-10-CM

## 2017-12-06 DIAGNOSIS — Z79899 Other long term (current) drug therapy: Secondary | ICD-10-CM

## 2017-12-06 LAB — BASIC METABOLIC PANEL
Anion gap: 5 (ref 5–15)
BUN: 17 mg/dL (ref 8–23)
CO2: 28 mmol/L (ref 22–32)
CREATININE: 0.99 mg/dL (ref 0.61–1.24)
Calcium: 9.5 mg/dL (ref 8.9–10.3)
Chloride: 105 mmol/L (ref 98–111)
Glucose, Bld: 123 mg/dL — ABNORMAL HIGH (ref 70–99)
POTASSIUM: 4.4 mmol/L (ref 3.5–5.1)
SODIUM: 138 mmol/L (ref 135–145)

## 2018-01-26 ENCOUNTER — Ambulatory Visit: Payer: Medicare Other | Admitting: Family Medicine

## 2018-01-26 ENCOUNTER — Encounter: Payer: Self-pay | Admitting: Family Medicine

## 2018-01-26 ENCOUNTER — Encounter: Payer: Self-pay | Admitting: *Deleted

## 2018-01-26 VITALS — BP 174/96 | HR 55 | Temp 97.8°F | Resp 16 | Ht 71.0 in | Wt 166.2 lb

## 2018-01-26 DIAGNOSIS — I491 Atrial premature depolarization: Secondary | ICD-10-CM | POA: Diagnosis not present

## 2018-01-26 DIAGNOSIS — F411 Generalized anxiety disorder: Secondary | ICD-10-CM

## 2018-01-26 DIAGNOSIS — I10 Essential (primary) hypertension: Secondary | ICD-10-CM | POA: Diagnosis not present

## 2018-01-26 DIAGNOSIS — I499 Cardiac arrhythmia, unspecified: Secondary | ICD-10-CM

## 2018-01-26 DIAGNOSIS — I255 Ischemic cardiomyopathy: Secondary | ICD-10-CM | POA: Diagnosis not present

## 2018-01-26 DIAGNOSIS — F99 Mental disorder, not otherwise specified: Secondary | ICD-10-CM

## 2018-01-26 DIAGNOSIS — F5105 Insomnia due to other mental disorder: Secondary | ICD-10-CM

## 2018-01-26 LAB — CBC
HEMATOCRIT: 46.3 % (ref 39.0–52.0)
Hemoglobin: 15.8 g/dL (ref 13.0–17.0)
MCHC: 34.1 g/dL (ref 30.0–36.0)
MCV: 94 fl (ref 78.0–100.0)
Platelets: 152 10*3/uL (ref 150.0–400.0)
RBC: 4.92 Mil/uL (ref 4.22–5.81)
RDW: 14.7 % (ref 11.5–15.5)
WBC: 4.9 10*3/uL (ref 4.0–10.5)

## 2018-01-26 LAB — BASIC METABOLIC PANEL
BUN: 17 mg/dL (ref 6–23)
CO2: 24 mEq/L (ref 19–32)
Calcium: 9.5 mg/dL (ref 8.4–10.5)
Chloride: 106 mEq/L (ref 96–112)
Creatinine, Ser: 0.87 mg/dL (ref 0.40–1.50)
GFR: 91.49 mL/min (ref >60.00)
GLUCOSE: 85 mg/dL (ref 70–99)
Potassium: 4.3 mEq/L (ref 3.5–5.1)
Sodium: 139 mEq/L (ref 135–145)

## 2018-01-26 LAB — TSH: TSH: 0.68 u[IU]/mL (ref 0.35–4.50)

## 2018-01-26 NOTE — Progress Notes (Signed)
OFFICE VISIT  01/26/2018   CC:  Chief Complaint  Patient presents with  . Follow-up    RCI, pt is not fasting.    HPI:    Patient is a 72 y.o. Caucasian male who presents for 6 mo f/u HTN, anxiety/insomnia, and ischemic CM.  Ischemic CM: his EF is such that he needs to consider CRT-D per cardiology recs back in 07/2017. He has been started on low dose aldactone by cardiology.  Also on ACE-I, BB, lasix, and ASA. Hx of statin intolerance. He is active, working.  No CP.  No DOE or palpitations. Occ LL swelling at end of day if he's been standing up a long time.  HTN: bp's at home mostly normal, occ reading around 151V systolic.  Anxiety/insomnia: takes 1/2 to 1 tab almost exclusively around bedtime, but sometimes for daytime anxiety, esp with wife going through chemo lately.   Discussed controlled substance contract today.   ROS: no CP, no SOB, no wheezing, no cough, no dizziness, no HAs, no rashes, no melena/hematochezia.  No polyuria or polydipsia.  No myalgias or arthralgias.  Past Medical History:  Diagnosis Date  . Anxiety   . Atrial fibrillation ([place])    Limited episode, emergency room, June, 2013, spontaneous conversion to sinus rhythm, was evaluated By [name] 2013- no further visits.  Post-op (CABG) a-fib, converted on amio but pt self d/c'd this med due to DOE/side effect profile.  [name] CAD (coronary artery disease) 05/2016   a. 05/2016: NSTEMI with cath showing 3-vessel disease. Underwent CABG on 05/12/16 with LIMA-D1, SVG-LAD, and SVG-PDA.   [name] COPD (chronic obstructive pulmonary disease) ([place]) fall 2016   Bullous changes noted on lower lung images of CT abd done by urology  . Diverticulitis   . GERD (gastroesophageal reflux disease)   . Has received pneumococcal vaccination   . Hyperlipidemia    statin intolerant  . Hypertension   . Ischemic cardiomyopathy 05/2016   EF 20-25% at the time of NSTEMI/CABG.  Repeat EF 07/2016 30-35%.  ACE-I and potassium stopped and  entresto and aldactone started 09/11/16>>then hyperkalemia so aldactone d/c'd.  07/2017 echo EF still 25-30%--Dr. Hochrein to began discussion of ICD need and also started benazapril and d/c'd hctz in late July 2019.  . Microscopic hematuria Fall 2016   CT nl except nonobstructing stones.  Cystoscopy normal 12/30/14 ([name])  . Nephrolithiasis    11 mm stone on R, 2 mm stone on L, ureters clear  . Non-STEMI (non-ST elevated myocardial infarction) ([place]) 05/2016   with impaired LV function  . 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  Urol ([name]) at [facility]; [name] referred him to [name] 03/2015: patient got bilat nerve sparing , robot assisted laparoscopic radical prostatectomy and pelvic lymphadenectomy.  Urol f/u, PSA undetectable on repeat 04/22/16 and 10/28/16; pt chose PSA surveill over adjuv rad tx.   . Tobacco dependence    down to 1-2 cigs per day as of 11/2015.  Restarted 08/2016.  [name] Urinary retention 05/2015   occurred s/p foley removal    Past Surgical History:  Procedure Laterality Date  . aortic ultrasound  07/2012   NO AAA  . COLONOSCOPY  approx 2011 per pt   Done by surgeon in [place] after a bout of diverticulitis; normal per pt report--was told to repeat in 10 yrs.  . CORONARY ARTERY BYPASS GRAFT N/A 05/12/2016   Procedure: CORONARY ARTERY BYPASS GRAFTING (CABG)  TIMES THREE USING LEFT INTERNAL MAMMARY ARTERY AND RIGHT SAPHENOUS LEG VEIN HARVESTED ENDOSCOPICALLY;  Surgeon: [name], MD;  Location: [facility];  Service: Open Heart Surgery;  Laterality: N/A;  . INGUINAL HERNIA REPAIR  2003 and 2004   Both sides.  [name] LEFT HEART CATH AND CORONARY ANGIOGRAPHY N/A 05/09/2016   Procedure: Left Heart Cath and Coronary Angiography;  Surgeon: [name], MD;  Location: [facility];  Service: Cardiovascular;  Laterality: N/A;  . LITHOTRIPSY  2004   Dr. Maryland Pink in [place].  [name] LYMPHADENECTOMY Bilateral 04/30/2015    Procedure: PELVIC LYMPHADENECTOMY;  Surgeon: [name], MD;  [facility];  Service: Urology;  Laterality: Bilateral;  . PROSTATE BIOPSY  02/05/15   Prostate adenocarcinoma: pt considering treatment options as of 02/19/15  . ROBOT ASSISTED LAPAROSCOPIC RADICAL PROSTATECTOMY N/A 04/30/2015   Procedure: XI ROBOTIC ASSISTED LAPAROSCOPIC RADICAL PROSTATECTOMY LEVEL 2;  Surgeon: [name], MD;  [facility];  Service: Urology;  Laterality: N/A;  . TEE WITHOUT CARDIOVERSION N/A 05/12/2016   Procedure: TRANSESOPHAGEAL ECHOCARDIOGRAM (TEE);  Surgeon: [name], MD;  Location: [place];  Service: Open Heart Surgery;  Laterality: N/A;  . TRANSTHORACIC ECHOCARDIOGRAM  03/22/2006; 05/2016; 07/2016; 07/2017   2008: EF 65%, normal valves, no wall motion abnormalties, LA size normal.  05/2016 in context of non-STEMI, EF 20-25%, mild AV and MV regurg.  08/05/16: EF 30-35%, akinesis of mid lateral myoc, grd II DD, mild aortic 07/2017: EF 25-30%, mult areas of akinesis, grd I DD.    Outpatient Medications Prior to Visit  Medication Sig Dispense Refill  . ALPRAZolam (XANAX) 0.25 MG tablet TAKE ONE TABLET BY MOUTH AT BEDTIME AS NEEDED 30 tablet 5  . aspirin 81 MG tablet Take 1 tablet (81 mg total) by mouth daily.    . benazepril (LOTENSIN) 40 MG tablet Take 40 mg by mouth 2 (two) times daily.    . carvedilol (COREG) 6.25 MG tablet Take 1 tablet (6.25 mg total) by mouth 2 (two) times daily with a meal. 180 tablet 3  . furosemide (LASIX) 20 MG tablet Take 20mg  (1 tablet) as needed for swelling. 30 tablet 3  . Omega-3 Fatty Acids (FISH OIL) 1000 MG CAPS Take 1 capsule by mouth daily.    [name] spironolactone (ALDACTONE) 25 MG tablet Take 1 tablet (25 mg total) by mouth daily. 90 tablet 3  . Vitamin D, Cholecalciferol, 400 units CAPS Take 400 Units by mouth daily.      No facility-administered medications prior to visit.     Allergies  Allergen Reactions  . Entresto [Sacubitril-Valsartan] Hives and  Shortness Of Breath  . Amlodipine Swelling    LE swelling  . Contrast Media [Iodinated Diagnostic Agents] Other (See Comments)    Prostate problem had to wear a catheter for two weeks after having dye.   [name] Hydralazine Palpitations and Other (See Comments)    Fatigue+    ROS As per HPI  PE: Blood pressure (!) 174/96, pulse (!) 55, temperature 97.8 F (36.6 C), temperature source Oral, resp. rate 16, height 5\' 11"  (1.803 m), weight 166 lb 4 oz (75.4 kg), SpO2 100 %. Gen: Alert, well appearing.  Patient is oriented to person, place, time, and situation. AFFECT: pleasant, lucid thought and speech. CV: some irregularity, rate 55-60, no murmur or rub. Chest is clear, no wheezing or rales. Normal symmetric air entry throughout both lung fields. No chest wall deformities or tenderness. EXT: no clubbing or cyanosis.  no edema.  LABS:  Lab Results  Component Value Date   TSH 1.181 05/16/2016   Lab Results  Component Value Date   WBC 6.9 07/12/2017   HGB 16.7 07/12/2017   HCT 48.6 07/12/2017   MCV 93.0 07/12/2017   PLT 167.0 07/12/2017   Lab Results  Component Value Date   CREATININE 0.99 12/06/2017   BUN 17 12/06/2017   NA 138 12/06/2017   K 4.4 12/06/2017   CL 105 12/06/2017   CO2 28 12/06/2017   Lab Results  Component Value Date   ALT 12 07/12/2017   AST 19 07/12/2017   ALKPHOS 65 07/12/2017   BILITOT 1.0 07/12/2017   Lab Results  Component Value Date   CHOL 167 07/12/2017   Lab Results  Component Value Date   HDL 34.90 (L) 07/12/2017   Lab Results  Component Value Date   LDLCALC 117 (H) 07/12/2017   Lab Results  Component Value Date   TRIG 74.0 07/12/2017   Lab Results  Component Value Date   CHOLHDL 5 07/12/2017   Lab Results  Component Value Date   PSA <0.02 10/15/2015   PSA 17.82 (H) 12/22/2014   PSA 13.92 (H) 11/14/2014   12 lead EKG today: sinus bradycardia, rate 52, frequent PACs, first deg AV block, QRS duration minimally widened (128  msec, nonspecific), isolated 1 mm ST segment depression in V5. Nonspecific T wave abnormality diffusely. No change compared to EKG 10/18/17.  IMPRESSION AND PLAN:  1) HTN: stable as per pt report of home measurements.   No changes today. BMET.  2) Anxiety/insomnia related to anxiety: doing ok considering his wife is getting chemo lately. Continue current use of alprazolam.  3) Ischemic CM: last EF 25-30% by echo, cardiology considering BV CRT-D. Maximizing med therapy first: BB, ACE-I, aldactone, lasix. Check CBC.  4) PAC's: hx of A-fib but sounds like he was never anticoagulated (unclear in records and per pt report), plus on one occasion he converted on amio but self d/c this med due to intolerance.   Continue to closely monitor rhythm. CBC, TSH, lytes/cr.  An After Visit Summary was printed and given to the patient.  FOLLOW UP: f/u 6 mo  Signed:  Crissie Sickles, MD           01/26/2018

## 2018-04-27 LAB — PSA: PSA: 0.019

## 2018-05-15 ENCOUNTER — Telehealth: Payer: Self-pay | Admitting: Cardiology

## 2018-05-15 NOTE — Telephone Encounter (Signed)
Virtual Visit Pre-Appointment Phone Call  Steps For Call:  1. Confirm consent - "In the setting of the current Covid19 crisis, you are scheduled for a (phone or video) visit with your provider on (date) at (time).  Just as we do with many in-office visits, in order for you to participate in this visit, we must obtain consent.  If you'd like, I can send this to your mychart (if signed up) or email for you to review.  Otherwise, I can obtain your verbal consent now.  All virtual visits are billed to your insurance company just like a normal visit would be.  By agreeing to a virtual visit, we'd like you to understand that the technology does not allow for your provider to perform an examination, and thus may limit your provider's ability to fully assess your condition.  Finally, though the technology is pretty good, we cannot assure that it will always work on either your or our end, and in the setting of a video visit, we may have to convert it to a phone-only visit.  In either situation, we cannot ensure that we have a secure connection.  Are you willing to proceed?" STAFF: Did the patient verbally acknowledge consent to telehealth visit? Document   YES   2. Confirm the BEST phone number to call the day of the visit: [phone]  3. Give patient instructions for WebEx/MyChart download to smartphone as below or Doximity/Doxy.me if video visit (depending on what platform provider is using)  4. Advise patient to be prepared with any vital sign or heart rhythm information, their current medicines, and a piece of paper and pen handy for any instructions they may receive the day of their visit  5. Inform patient they will receive a phone call 15 minutes prior to their appointment time (may be from unknown caller ID) so they should be prepared to answer  6. Confirm that appointment type is correct in Epic appointment notes (video vs telephone)     TELEPHONE CALL NOTE  Daniel Sandhoff. has been  deemed a candidate for a follow-up tele-health visit to limit community exposure during the Covid-19 pandemic. I spoke with the patient via phone to ensure availability of phone/video source, confirm preferred email & phone number, and discuss instructions and expectations.  I reminded Daniel Sandhoff. to be prepared with any vital sign and/or heart rhythm information that could potentially be obtained via home monitoring, at the time of his visit. I reminded Daniel Sandhoff. to expect a phone call at the time of his visit if his visit.  Daniel [name] 05/15/2018 1:19 PM   DOWNLOADING THE [facility] APP TO SMARTPHONE  - If Apple, ask patient to go to [employer] and type in WebEx in the search bar. [place] [employer], the blue/green circle. If Android, go to [employer] and type in [employer] in the search bar. The app is free but as with any other app downloads, their phone may require them to verify saved payment information or Apple/Android password.  - The patient does NOT have to create an account. - On the day of the visit, the assist will walk the patient through joining the meeting with the meeting number/password.  DOWNLOADING THE MYCHART APP TO SMARTPHONE  - If Apple, go to [employer] and type in MyChart in the search bar and download the app. If Android, ask patient to go to [employer] and type in [employer]  in the search bar and download the app. The app is free but as with any other app downloads, their phone may require them to verify saved payment information or Apple/Android password.  - The patient will need to then log into the app with their MyChart username and password, and select [facility] as their healthcare provider to link the account. When it is time for your visit, go to the MyChart app, find appointments, and click Begin Video Visit. Be sure to Select Allow for your device to access the Microphone and Camera for your visit. You will then be connected, and your  provider will be with you shortly.  **If they have any issues connecting, or need assistance please contact [facility] [phone])**  **If using a computer, in order to ensure the best quality for your visit they will need to use either of the following Internet Browsers: Microsoft [place], or Google Chrome**  [place]   I hereby voluntarily request, consent and authorize [facility] and its employed or contracted physicians, physician assistants, nurse practitioners or other licensed health care professionals (the Practitioner), to provide me with telemedicine health care services (the Services") as deemed necessary by the treating Practitioner. I acknowledge and consent to receive the Services by the Practitioner via telemedicine. I understand that the telemedicine visit will involve communicating with the Practitioner through live audiovisual communication technology and the disclosure of certain medical information by electronic transmission. I acknowledge that I have been given the opportunity to request an in-person assessment or other available alternative prior to the telemedicine visit and am voluntarily participating in the telemedicine visit.  I understand that I have the right to withhold or withdraw my consent to the use of telemedicine in the course of my care at any time, without affecting my right to future care or treatment, and that the Practitioner or I may terminate the telemedicine visit at any time. I understand that I have the right to inspect all information obtained and/or recorded in the course of the telemedicine visit and may receive copies of available information for a reasonable fee.  I understand that some of the potential risks of receiving the Services via telemedicine include:   Delay or interruption in medical evaluation due to technological equipment failure or disruption;  Information transmitted may not be  sufficient (e.g. poor resolution of images) to allow for appropriate medical decision making by the Practitioner; and/or   In rare instances, security protocols could fail, causing a breach of personal health information.  Furthermore, I acknowledge that it is my responsibility to provide information about my medical history, conditions and care that is complete and accurate to the best of my ability. I acknowledge that Practitioner's advice, recommendations, and/or decision may be based on factors not within their control, such as incomplete or inaccurate data provided by me or distortions of diagnostic images or specimens that may result from electronic transmissions. I understand that the practice of medicine is not an exact science and that Practitioner makes no warranties or guarantees regarding treatment outcomes. I acknowledge that I will receive a copy of this consent concurrently upon execution via email to the email address I last provided but may also request a printed copy by calling the office of [facility].    I understand that my insurance will be billed for this visit.   I have read or had this consent read to me.  I understand the contents of this  consent, which adequately explains the benefits and risks of the Services being provided via telemedicine.   I have been provided ample opportunity to ask questions regarding this consent and the Services and have had my questions answered to my satisfaction.  I give my informed consent for the services to be provided through the use of telemedicine in my medical care  By participating in this telemedicine visit I agree to the above.

## 2018-05-15 NOTE — Progress Notes (Signed)
Virtual Visit via Telephone Note   This visit type was conducted due to national recommendations for restrictions regarding the COVID-19 Pandemic (e.g. social distancing) in an effort to limit this patient's exposure and mitigate transmission in our community.  Due to his co-morbid illnesses, this patient is at least at moderate risk for complications without adequate follow up.  This format is felt to be most appropriate for this patient at this time.  The patient did not have access to video technology/had technical difficulties with video requiring transitioning to audio format only (telephone).  All issues noted in this document were discussed and addressed.  No physical exam could be performed with this format.  Please refer to the patient's chart for his  consent to telehealth for [facility].   Evaluation Performed:  Follow-up visit  Date:  05/16/2018   ID:  Daniel Sandhoff., DOB 1945-02-05, MRN [id number]  Patient Location: Home Provider Location: Home  PCP:  [name], MD  Cardiologist:  [name] Electrophysiologist:  None   Chief Complaint:  Fatigue  History of Present Illness:    Daniel Baines. is a 73 y.o. male who presents for follow up of CAD s/p CABG LIMA to D1, SVG to LAD, SVG to PDA on 05/12/2016, HLD intolerant to statin, HTN, ICM with baseline EF 30-35%, and h/o prostate CA.His last cardiac catheterization on 05/09/2016 showed EF 20-25%, akinesis of the distal inferoapical segment with significant hypokinesis globally consistent with ischemiccardiomyopathy, multivessel disease with 70% followed by total occlusion of large LAD with distal collateral arteries, 50% proximal left circumflex stenosis with total occlusion of distal left circumflex, total occlusion of mid RCA with distal collateral artery. Bypass surgery was recommended and he eventually underwent successful CABG 3 by Dr. Roxan Hockey on 05/12/2016 with LIMA to diagonal, SVG to LAD, SVG to PDA. He  developed postoperative atrial fibrillation and was treated with IV amiodarone with conversion to sinus rhythm prior to discharge. He had a follow-up echocardiogram on 08/05/2016 which revealed LV EF remains low at 30-35% however slightly improved from his previous 20-25%.  He was placed on carvedilol and Entresto, spironolactone was discontinued due to elevated potassium level. Unfortunately, he went back to the hospital on 11/20/2016 with shortness breath, diffuse rash and presyncope.   He had an echo in July of last year.   EF still was 25 - 30%.   Today he reports that he is tired but this is not new.  He might get short of breath with a significant activity but again this is not new either.  He denies any chest discomfort, neck or arm discomfort.  He does say that when he takes his beta-blocker his heartbeat hard.  He denies any PND or orthopnea.  He has had no leg swelling.  He has no presyncope or syncope.  The patient does not have symptoms concerning for COVID-19 infection (fever, chills, cough, or new shortness of breath).    Past Medical History:  Diagnosis Date   Anxiety    Atrial fibrillation [facility])    Limited episode, emergency room, June, 2013, spontaneous conversion to sinus rhythm, was evaluated By [name] 2013- no further visits.  Post-op (CABG) a-fib, converted on amio but pt self d/c'd this med due to DOE/side effect profile.   CAD (coronary artery disease) 05/2016   a. 05/2016: NSTEMI with cath showing 3-vessel disease. Underwent CABG on 05/12/16 with LIMA-D1, SVG-LAD, and SVG-PDA.    COPD (chronic obstructive pulmonary disease) ([place])  fall 2016   Bullous changes noted on lower lung images of CT abd done by urology   Diverticulitis    GERD (gastroesophageal reflux disease)    Has received pneumococcal vaccination    Hyperlipidemia    statin intolerant   Hypertension    Ischemic cardiomyopathy 05/2016   EF 20-25% at the time of NSTEMI/CABG.  Repeat EF 07/2016 30-35%.   ACE-I and potassium stopped and entresto and aldactone started 09/11/16>>then hyperkalemia so aldactone d/c'd.  07/2017 echo EF still 25-30%--Dr. Roselind Klus to began discussion of ICD need and also started benazapril and d/c'd hctz in late July 2019.   Microscopic hematuria Fall 2016   CT nl except nonobstructing stones.  Cystoscopy normal 12/30/14 (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  Urol ([name]) at [facility]; [name] referred him to [name] 03/2015: patient got bilat nerve sparing , robot assisted laparoscopic radical prostatectomy and pelvic lymphadenectomy.  Urol f/u, PSA undetectable on repeat 04/22/16 and 10/28/16; pt chose PSA surveill over adjuv rad tx.    Tobacco dependence    down to 1-2 cigs per day as of 11/2015.  Restarted 08/2016.   Urinary retention 05/2015   occurred s/p foley removal   Past Surgical History:  Procedure Laterality Date   aortic ultrasound  07/2012   NO AAA   COLONOSCOPY  approx 2011 per pt   Done by surgeon in [place] after a bout of diverticulitis; normal per pt report--was told to repeat in 10 yrs.   CORONARY ARTERY BYPASS GRAFT N/A 05/12/2016   Procedure: CORONARY ARTERY BYPASS GRAFTING (CABG) TIMES THREE USING LEFT INTERNAL MAMMARY ARTERY AND RIGHT SAPHENOUS LEG VEIN HARVESTED ENDOSCOPICALLY;  Surgeon: [name], MD;  Location: [facility];  Service: Open Heart Surgery;  Laterality: N/A;   INGUINAL HERNIA REPAIR  2003 and 2004   Both sides.   LEFT HEART CATH AND CORONARY ANGIOGRAPHY N/A 05/09/2016   Procedure: Left Heart Cath and Coronary Angiography;  Surgeon: [name], MD;  Location: [facility];  Service: Cardiovascular;  Laterality: N/A;   LITHOTRIPSY  2004   Dr. [place] Pink in [place].    LYMPHADENECTOMY Bilateral 04/30/2015   Procedure: PELVIC LYMPHADENECTOMY;  Surgeon: [name], MD;  Location: [facility];  Service: Urology;  Laterality: Bilateral;   PROSTATE BIOPSY  02/05/15   Prostate adenocarcinoma: pt considering treatment options as of 02/19/15   ROBOT ASSISTED LAPAROSCOPIC RADICAL PROSTATECTOMY N/A 04/30/2015   Procedure: XI ROBOTIC ASSISTED LAPAROSCOPIC RADICAL PROSTATECTOMY LEVEL 2;  Surgeon: [name], MD;  Location: [facility];  Service: Urology;  Laterality: N/A;   TEE WITHOUT CARDIOVERSION N/A 05/12/2016   Procedure: TRANSESOPHAGEAL ECHOCARDIOGRAM (TEE);  Surgeon: [name], MD;  Location: [facility];  Service: Open Heart Surgery;  Laterality: N/A;   TRANSTHORACIC ECHOCARDIOGRAM  03/22/2006; 05/2016; 07/2016; 07/2017   2008: EF 65%, normal valves, no wall motion abnormalties, LA size normal.  05/2016 in context of non-STEMI, EF 20-25%, mild AV and MV regurg.  08/05/16: EF 30-35%, akinesis of mid lateral myoc, grd II DD, mild aortic 07/2017: EF 25-30%, mult areas of akinesis, grd I DD.    Prior to Admission medications   Medication Sig Start Date End Date Taking? Authorizing Provider  ALPRAZolam (  XANAX) 0.25 MG tablet TAKE ONE TABLET BY MOUTH AT BEDTIME AS NEEDED 12/01/17  Yes McGowen, Adrian Blackwater, MD  aspirin 81 MG tablet Take 1 tablet (81 mg total) by mouth daily. 06/07/17  Yes [name], MD  benazepril (LOTENSIN) 40 MG tablet Take 40 mg by mouth 2 (two) times daily.   Yes [provider]  spironolactone (ALDACTONE) 25 MG tablet Take 1 tablet (25 mg total) by mouth daily. 10/18/17  Yes [name], MD  Vitamin D, Cholecalciferol, 400 units CAPS Take 400 Units by mouth daily.    Yes [provider]  carvedilol (COREG) 6.25 MG tablet Take 1 tablet (6.25 mg total) by mouth 2 (two) times daily with a meal. 06/07/17   [name], MD  furosemide (LASIX) 20 MG tablet Take 20mg  (1 tablet) as needed for swelling. 09/09/16   [name], MD  Omega-3  Fatty Acids (FISH OIL) 1000 MG CAPS Take 1 capsule by mouth daily.    [provider]  Cetirizine HCl (ZYRTEC ALLERGY) 10 MG CAPS Take 1 capsule (10 mg total) by mouth daily. 11/06/14 11/06/14  Malvin Johns, MD    Allergies:   Delene Loll [sacubitril-valsartan]; Amlodipine; Contrast media [iodinated diagnostic agents]; and Hydralazine   Social History   Tobacco Use   Smoking status: Former Smoker    Packs/day: 1.00    Years: 55.00    Pack years: 55.00    Types: Cigarettes   Smokeless tobacco: Never Used   Tobacco comment: STOPPED AT SURGERY  Substance Use Topics   Alcohol use: No   Drug use: No     Family Hx: [name] family history includes Cancer in his father; Cancer - Other (age of onset: 44) in his mother; Diabetes in his sister; Hypertension in his mother.  ROS:   Please see the history of present illness.    As stated in the HPI and negative for all other systems.   Prior CV studies:   The following studies were reviewed today:    Labs/Other Tests and Data Reviewed:    EKG:  No ECG reviewed.  Recent Labs: 07/12/2017: ALT 12 01/26/2018: BUN 17; Creatinine, Ser 0.87; Hemoglobin 15.8; Platelets 152.0; Potassium 4.3; Sodium 139; TSH 0.68   Recent Lipid Panel Lab Results  Component Value Date/Time   CHOL 167 07/12/2017 08:34 AM   CHOL 137 09/16/2016 08:58 AM   TRIG 74.0 07/12/2017 08:34 AM   HDL 34.90 (L) 07/12/2017 08:34 AM   HDL 35 (L) 09/16/2016 08:58 AM   CHOLHDL 5 07/12/2017 08:34 AM   LDLCALC 117 (H) 07/12/2017 08:34 AM   LDLCALC 88 09/16/2016 08:58 AM    Wt Readings from Last 3 Encounters:  05/16/18 168 lb (76.2 kg)  01/26/18 166 lb 4 oz (75.4 kg)  10/18/17 170 lb (77.1 kg)     Objective:    Vital Signs:  BP (!) 150/75 (BP Location: Right Arm, Patient Position: Sitting, Cuff Size: Normal)    Pulse 65    Ht 5\' 7"  (1.702 m)    Wt 168 lb (76.2 kg)    BMI 26.31 kg/m     ASSESSMENT & PLAN:    CAD s/p CABG:    The patient has no  new sypmtoms.  No further cardiovascular testing is indicated.  We will continue with aggressive risk reduction and meds as listed.  Ischemic cardiomyopathy:    His EF remains low.  I wanted to try continue to titrate Entresto but he did not tolerate this.  Ultimately he and I have discussed a defibrillator and I will refer him for this as well when virus spread concerns have abated to a reasonable degree.   Hypertension:    This is being managed in the context of treating his CHF  Hyperlipidemia:     LDL is 117.  HDL 34.  He has not tolerated statins.  I will try to talk him into PCSK9 inhibitors in the future.  PAF:   He has had no symptomatic recurrence of this.  Tobacco :    He is not smoking.  No change in therapy.   COVID-19 Education: The signs and symptoms of COVID-19 were discussed with the patient and how to seek care for testing (follow up with PCP or arrange E-visit).  He seems to be going back and forth to his friend's shop and not wearing a mask and we discussed this.  The importance of social distancing was discussed today.  Time:   Today, I have spent 21 minutes with the patient with telehealth technology discussing the above problems.     Medication Adjustments/Labs and Tests Ordered: Current medicines are reviewed at length with the patient today.  Concerns regarding medicines are outlined above.   Tests Ordered: No orders of the defined types were placed in this encounter.   Medication Changes: No orders of the defined types were placed in this encounter.   Disposition:  Follow up six weeks.   Signed, [name], MD  05/16/2018 12:12 PM    [place]

## 2018-05-16 ENCOUNTER — Encounter: Payer: Self-pay | Admitting: Cardiology

## 2018-05-16 ENCOUNTER — Telehealth (INDEPENDENT_AMBULATORY_CARE_PROVIDER_SITE_OTHER): Payer: Medicare Other | Admitting: Cardiology

## 2018-05-16 VITALS — BP 150/75 | HR 65 | Ht 67.0 in | Wt 168.0 lb

## 2018-05-16 DIAGNOSIS — I5022 Chronic systolic (congestive) heart failure: Secondary | ICD-10-CM

## 2018-05-16 DIAGNOSIS — I10 Essential (primary) hypertension: Secondary | ICD-10-CM

## 2018-05-16 DIAGNOSIS — Z951 Presence of aortocoronary bypass graft: Secondary | ICD-10-CM

## 2018-05-16 DIAGNOSIS — E785 Hyperlipidemia, unspecified: Secondary | ICD-10-CM

## 2018-05-16 DIAGNOSIS — Z7189 Other specified counseling: Secondary | ICD-10-CM | POA: Insufficient documentation

## 2018-05-16 DIAGNOSIS — I25810 Atherosclerosis of coronary artery bypass graft(s) without angina pectoris: Secondary | ICD-10-CM

## 2018-05-16 NOTE — Patient Instructions (Addendum)
Medication Instructions:  The current medical regimen is effective;  continue present plan and medications.  If you need a refill on your cardiac medications before your next appointment, please call your pharmacy.   Follow-Up: Follow up in 6 weeks with Dr Percival Spanish in [place] (phone) as schedule 06/27/2018 at 12N.  Thank you for choosing [facility]!!

## 2018-05-17 ENCOUNTER — Encounter: Payer: Self-pay | Admitting: Family Medicine

## 2018-06-05 ENCOUNTER — Other Ambulatory Visit: Payer: Self-pay | Admitting: Family Medicine

## 2018-06-05 NOTE — Telephone Encounter (Signed)
RF request for Alprazolam LOV: 01/26/18 RCI  Next ov: 07/27/18 CPE Last written: 12/01/17 (30,5) Last CSC:01/26/18, no UDS  PMP aware printed. Medication pending, Please advise on refill, thanks.

## 2018-06-22 ENCOUNTER — Telehealth: Payer: Self-pay

## 2018-06-22 NOTE — Telephone Encounter (Signed)
Virtual Visit Pre-Appointment Phone Call  "(Name), I am calling you today to discuss your upcoming appointment. We are currently trying to limit exposure to the virus that causes COVID-19 by seeing patients at home rather than in the office."  1. "What is the BEST phone number to call the day of the visit?" - include this in appointment notes  2. "Do you have or have access to (through a family member/friend) a smartphone with video capability that we can use for your visit?" a. If yes - list this number in appt notes as "cell" (if different from BEST phone #) and list the appointment type as a VIDEO visit in appointment notes b. If no - list the appointment type as a PHONE visit in appointment notes  3. Confirm consent - "In the setting of the current Covid19 crisis, you are scheduled for a (phone or video) visit with your provider on (date) at (time).  Just as we do with many in-office visits, in order for you to participate in this visit, we must obtain consent.  If you'd like, I can send this to your mychart (if signed up) or email for you to review.  Otherwise, I can obtain your verbal consent now.  All virtual visits are billed to your insurance company just like a normal visit would be.  By agreeing to a virtual visit, we'd like you to understand that the technology does not allow for your provider to perform an examination, and thus may limit your provider's ability to fully assess your condition. If your provider identifies any concerns that need to be evaluated in person, we will make arrangements to do so.  Finally, though the technology is pretty good, we cannot assure that it will always work on either your or our end, and in the setting of a video visit, we may have to convert it to a phone-only visit.  In either situation, we cannot ensure that we have a secure connection.  Are you willing to proceed?" STAFF: Did the patient verbally acknowledge?  YES  4. Advise patient to be  prepared - "Two hours prior to your appointment, go ahead and check your blood pressure, pulse, oxygen saturation, and your weight (if you have the equipment to check those) and write them all down. When your visit starts, your provider will ask you for this information. If you have an Apple Watch or Kardia device, please plan to have heart rate information ready on the day of your appointment. Please have a pen and paper handy nearby the day of the visit as well."  5. Give patient instructions for MyChart download to smartphone OR Doximity/Doxy.me as below if video visit (depending on what platform provider is using)  6. Inform patient they will receive a phone call 15 minutes prior to their appointment time (may be from unknown caller ID) so they should be prepared to answer    TELEPHONE CALL NOTE  Daniel [name]. has been deemed a candidate for a follow-up tele-health visit to limit community exposure during the Covid-19 pandemic. I spoke with the patient via phone to ensure availability of phone/video source, confirm preferred email & phone number, and discuss instructions and expectations.  I reminded Daniel [name]. to be prepared with any vital sign and/or heart rhythm information that could potentially be obtained via home monitoring, at the time of his visit. I reminded Daniel [name]. to expect a phone call prior to his visit.  Claudie Fisherman, [place] 06/22/2018 3:21 PM   INSTRUCTIONS FOR DOWNLOADING THE MYCHART APP TO SMARTPHONE  - The patient must first make sure to have activated MyChart and know their login information - If Apple, go to [employer] and type in MyChart in the search bar and download the app. If Android, ask patient to go to [employer] and type in [place] in the search bar and download the app. The app is free but as with any other app downloads, their phone may require them to verify saved payment information or Apple/Android password.  - The patient  will need to then log into the app with their MyChart username and password, and select [facility]  as their healthcare provider to link the account. When it is time for your visit, go to the MyChart app, find appointments, and click Begin Video Visit. Be sure to Select Allow for your device to access the Microphone and Camera for your visit. You will then be connected, and your provider will be with you shortly.  **If they have any issues connecting, or need assistance please contact MyChart service desk [phone])**  **If using a computer, in order to ensure the best quality for their visit they will need to use either of the following Internet Browsers: [employer], or Google Chrome**  IF USING DOXIMITY or DOXY.ME - The patient will receive a link just prior to their visit by text.     FULL LENGTH CONSENT FOR TELE-HEALTH VISIT   I hereby voluntarily request, consent and authorize [facility] and its employed or contracted physicians, physician assistants, nurse practitioners or other licensed health care professionals (the Practitioner), to provide me with telemedicine health care services (the "Services") as deemed necessary by the treating Practitioner. I acknowledge and consent to receive the Services by the Practitioner via telemedicine. I understand that the telemedicine visit will involve communicating with the Practitioner through live audiovisual communication technology and the disclosure of certain medical information by electronic transmission. I acknowledge that I have been given the opportunity to request an in-person assessment or other available alternative prior to the telemedicine visit and am voluntarily participating in the telemedicine visit.  I understand that I have the right to withhold or withdraw my consent to the use of telemedicine in the course of my care at any time, without affecting my right to future care or treatment, and that the Practitioner  or I may terminate the telemedicine visit at any time. I understand that I have the right to inspect all information obtained and/or recorded in the course of the telemedicine visit and may receive copies of available information for a reasonable fee.  I understand that some of the potential risks of receiving the Services via telemedicine include:  [name] Delay or interruption in medical evaluation due to technological equipment failure or disruption; . Information transmitted may not be sufficient (e.g. poor resolution of images) to allow for appropriate medical decision making by the Practitioner; and/or  . In rare instances, security protocols could fail, causing a breach of personal health information.  Furthermore, I acknowledge that it is my responsibility to provide information about my medical history, conditions and care that is complete and accurate to the best of my ability. I acknowledge that Practitioner's advice, recommendations, and/or decision may be based on factors not within their control, such as incomplete or inaccurate data provided by me or distortions of diagnostic images or specimens that may result from electronic transmissions. I understand  that the practice of medicine is not an exact science and that Practitioner makes no warranties or guarantees regarding treatment outcomes. I acknowledge that I will receive a copy of this consent concurrently upon execution via email to the email address I last provided but may also request a printed copy by calling the office of [facility].    I understand that my insurance will be billed for this visit.   I have read or had this consent read to me. . I understand the contents of this consent, which adequately explains the benefits and risks of the Services being provided via telemedicine.  . I have been provided ample opportunity to ask questions regarding this consent and the Services and have had my questions answered to my satisfaction.  . I give my informed consent for the services to be provided through the use of telemedicine in my medical care  By participating in this telemedicine visit I agree to the above.

## 2018-06-26 NOTE — Progress Notes (Signed)
Virtual Visit via Telephone Note   This visit type was conducted due to national recommendations for restrictions regarding the COVID-19 Pandemic (e.g. social distancing) in an effort to limit this patient's exposure and mitigate transmission in our community.  Due to his co-morbid illnesses, this patient is at least at moderate risk for complications without adequate follow up.  This format is felt to be most appropriate for this patient at this time.  The patient did not have access to video technology/had technical difficulties with video requiring transitioning to audio format only (telephone).  All issues noted in this document were discussed and addressed.  No physical exam could be performed with this format.  Please refer to the patient's chart for his  consent to telehealth for [facility].   Date:  06/27/2018   ID:  [name]., DOB [birth year], MRN [id number]  Patient Location: Home Provider Location: Home  PCP:  [name], MD  Cardiologist:  [name], MD  Electrophysiologist:  None   Evaluation Performed:  Follow-Up Visit  Chief Complaint:  DOE  History of Present Illness:    Ferry Matthis. is a 73 y.o. male  who presents for follow up of CAD s/p CABG LIMA to D1, SVG to LAD, SVG to PDA on 05/12/2016, HLD intolerant to statin, HTN, ICM with baseline EF 30-35%, and h/o prostate CA.His last cardiac catheterization on 05/09/2016 showed EF 20-25%, akinesis of the distal inferoapical segment with significant hypokinesis globally consistent with ischemiccardiomyopathy, multivessel disease with 70% followed by total occlusion of large LAD with distal collateral arteries, 50% proximal left circumflex stenosis with total occlusion of distal left circumflex, total occlusion of mid RCA with distal collateral artery. Bypass surgery was recommended and he eventually underwent successful CABG 3 by Dr. Roxan Hockey on 05/12/2016 with LIMA to diagonal, SVG to LAD, SVG to PDA. He  developed postoperative atrial fibrillation and was treated with IV amiodarone with conversion to sinus rhythm prior to discharge. He had a follow-up echocardiogram on 08/05/2016 which revealed LV EF remains low at 30-35% however slightly improved from his previous 20-25%. He was placed on carvedilol and Entresto, spironolactone was discontinued due to elevated potassium level. Unfortunately, he went back to the hospital on 11/20/2016 with shortness breath, diffuse rash and presyncope.  The Delene Loll was stopped.    He had an echo in July of last year.   EF still was 25 - 30%.  we talked about an ICD but his wife was going through chemo and he wanted to hold off on this when I talked to him last.   Unfortunately, he is not really taking his medications as was previously prescribed.  He started to get more rash and other symptoms and thought again it was related to his medications.  Therefore, he stopped taking his benazepril.  He then stopped taking it sounds like his carvedilol.  For a while he was not taking spironolactone.  It sounds like he took a 40 mg of benazepril this morning.  He denies any presyncope or syncope.  He says he does get tired if he does heavy work.  He is not describing acute shortness of breath, PND or orthopnea.  He is not describing palpitations, presyncope or syncope.  He denies any weight gain or edema.  The patient does not have symptoms concerning for COVID-19 infection (fever, chills, cough, or new shortness of breath).    Past Medical History:  Diagnosis Date   Anxiety  Atrial fibrillation ([place])    Limited episode, emergency room, June, 2013, spontaneous conversion to sinus rhythm, was evaluated By [name] 2013- no further visits.  Post-op (CABG) a-fib, converted on amio but pt self d/c'd this med due to DOE/side effect profile.   CAD (coronary artery disease) 05/2016   a. 05/2016: NSTEMI with cath showing 3-vessel disease. Underwent CABG on 05/12/16 with LIMA-D1,  SVG-LAD, and SVG-PDA.    COPD (chronic obstructive pulmonary disease) ([place]) fall 2016   Bullous changes noted on lower lung images of CT abd done by urology   Diverticulitis    GERD (gastroesophageal reflux disease)    Has received pneumococcal vaccination    Hyperlipidemia    statin intolerant   Hypertension    Ischemic cardiomyopathy 05/2016   EF 20-25% at the time of NSTEMI/CABG.  Repeat EF 07/2016 30-35%.  ACE-I and potassium stopped and entresto and aldactone started 09/11/16>>then hyperkalemia so aldactone d/c'd.  07/2017 echo EF still 25-30%--Dr. Kymberly Blomberg to began discussion of ICD need and also started benazapril and d/c'd hctz in late July 2019.  To get ICD soon as of 05/2018 cardiology f/u (EF still 25-30%).   Microscopic hematuria Fall 2016   CT nl except nonobstructing stones.  Cystoscopy normal 12/30/14 (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  Urol ([name]) at [facility]; [name] referred him to [name] 03/2015: patient got bilat nerve sparing , robot assisted laparoscopic radical prostatectomy and pelvic lymphadenectomy.  Urol f/u, PSA undetectable on repeat 04/22/16 and 10/28/16; pt chose PSA surveill over adjuv rad tx.    Tobacco dependence    down to 1-2 cigs per day as of 11/2015.  Restarted 08/2016.   Urinary retention 05/2015   occurred s/p foley removal   Past Surgical History:  Procedure Laterality Date   aortic ultrasound  07/2012   NO AAA   COLONOSCOPY  approx 2011 per pt   Done by surgeon in [place] after a bout of diverticulitis; normal per pt report--was told to repeat in 10 yrs.   CORONARY ARTERY BYPASS GRAFT N/A 05/12/2016   Procedure: CORONARY ARTERY BYPASS GRAFTING (CABG) TIMES THREE USING LEFT INTERNAL MAMMARY ARTERY  AND RIGHT SAPHENOUS LEG VEIN HARVESTED ENDOSCOPICALLY;  Surgeon: [name], MD;  Location: [facility];  Service: Open Heart Surgery;  Laterality: N/A;   INGUINAL HERNIA REPAIR  2003 and 2004   Both sides.   LEFT HEART CATH AND CORONARY ANGIOGRAPHY N/A 05/09/2016   Procedure: Left Heart Cath and Coronary Angiography;  Surgeon: [name], MD;  Location: [facility];  Service: Cardiovascular;  Laterality: N/A;   LITHOTRIPSY  2004   Dr. Maryland Pink in [place].   LYMPHADENECTOMY Bilateral 04/30/2015   Procedure: PELVIC LYMPHADENECTOMY;  Surgeon: [name], MD;  Location: [facility];  Service: Urology;  Laterality: Bilateral;   PROSTATE BIOPSY  02/05/15   Prostate adenocarcinoma: pt considering treatment options as of 02/19/15   ROBOT ASSISTED LAPAROSCOPIC RADICAL PROSTATECTOMY N/A 04/30/2015   Procedure: XI ROBOTIC ASSISTED LAPAROSCOPIC RADICAL PROSTATECTOMY LEVEL 2;  Surgeon: [name], MD;  Location: [facility];  Service: Urology;  Laterality: N/A;   TEE WITHOUT CARDIOVERSION N/A 05/12/2016   Procedure: TRANSESOPHAGEAL ECHOCARDIOGRAM (TEE);  Surgeon: [name], MD;  Location: [facility];  Service: Open Heart Surgery;  Laterality: N/A;   TRANSTHORACIC ECHOCARDIOGRAM  03/22/2006; 05/2016; 07/2016; 07/2017   2008: EF 65%, normal valves, no wall motion abnormalties, LA size normal.  05/2016 in context of non-STEMI, EF 20-25%, mild AV and MV regurg.  08/05/16: EF 30-35%, akinesis of mid lateral myoc, grd II DD, mild aortic 07/2017: EF 25-30%, mult areas of akinesis, grd I DD.     Current Meds  Medication Sig   ALPRAZolam (XANAX) 0.25 MG tablet TAKE ONE TABLET BY MOUTH AT BEDTIME AS NEEDED   aspirin 81 MG tablet Take 1 tablet (81 mg total) by mouth daily.   benazepril (LOTENSIN) 40 MG tablet 40 mg. Take 1/2 tablet by mouth twice daily   furosemide (LASIX) 20 MG tablet Take 20mg  (1 tablet) as needed for swelling.   spironolactone (ALDACTONE) 25 MG tablet Take 1 tablet (25 mg total) by  mouth daily. (Patient taking differently: 25 mg. Take 1/2 tablet daily)   Vitamin D, Cholecalciferol, 400 units CAPS Take 400 Units by mouth daily.      Allergies:   Entresto [sacubitril-valsartan]; Amlodipine; Contrast media [iodinated diagnostic agents]; and Hydralazine   Social History   Tobacco Use   Smoking status: Former Smoker    Packs/day: 1.00    Years: 55.00    Pack years: 55.00    Types: Cigarettes   Smokeless tobacco: Never Used   Tobacco comment: STOPPED AT SURGERY  Substance Use Topics   Alcohol use: No   Drug use: No     Family Hx: [name] family history includes Cancer in his father; Cancer - Other (age of onset: 1) in his mother; Diabetes in his sister; Hypertension in his mother.  ROS:   Please see the history of present illness.    As stated in the HPI and negative for all other systems.   Prior CV studies:   The following studies were reviewed today:    Labs/Other Tests and Data Reviewed:    EKG:  No ECG reviewed.  Recent Labs: 07/12/2017: ALT 12 01/26/2018: BUN 17; Creatinine, Ser 0.87; Hemoglobin 15.8; Platelets 152.0; Potassium 4.3; Sodium 139; TSH 0.68   Recent Lipid Panel Lab Results  Component Value Date/Time   CHOL 167 07/12/2017 08:34 AM   CHOL 137 09/16/2016 08:58 AM   TRIG 74.0 07/12/2017 08:34 AM   HDL 34.90 (L) 07/12/2017 08:34 AM   HDL 35 (L) 09/16/2016 08:58 AM   CHOLHDL 5 07/12/2017 08:34 AM   LDLCALC 117 (H) 07/12/2017 08:34 AM   LDLCALC 88 09/16/2016 08:58 AM    Wt Readings from Last 3 Encounters:  06/27/18 163 lb (73.9 kg)  05/16/18 168 lb (76.2 kg)  01/26/18 166 lb 4 oz (75.4 kg)     Objective:    Vital Signs:  BP (!) 160/80    Pulse 66    Ht 5\' 11"  (1.803 m)    Wt 163 lb (73.9 kg)    BMI 22.73 kg/m    VITAL SIGNS:  reviewed  ASSESSMENT & PLAN:    CAD s/p CABG:    The patient has no new sypmtoms.  No further cardiovascular testing is indicated.  We will continue with aggressive risk reduction and  meds as listed.  Ischemic cardiomyopathy:   EF remains low at 25 - 30%.  We went over his meds in detail.  Is very difficult because he is taking himself off and on the meds.  Had a long discussion with him about the  difficulty in doing this.  It is also difficult to tell if any of his medications are causing any reaction.  This was previously thought to be the [facility] which was stopped.  I have encouraged him to restart his medications.  He could at least start half dose.  We would then if he continues to have symptoms suggestive of the reaction take away 1 medication at a time.  He is agreeable to this and pleasant but has I think a limited understanding.  Hopefully he will comply.  He does not want to consider an ICD yet at this point.  Hypertension:   This is being managed in the context of treating his CHF  Hyperlipidemia:        LDL was mildly elevated at 117.  However, he is reluctant to take more medications.  No change in therapy.  PAF:   He has had no symptomatic recurrence of this.  No change in therapy.   Tobacco :   He continues to not smoke.   COVID-19 Education: The signs and symptoms of COVID-19 were discussed with the patient and how to seek care for testing (follow up with PCP or arrange E-visit).  He was not wearing a mask and we discussed this.  The importance of social distancing was discussed today.  Time:   Today, I have spent 25 minutes with the patient with telehealth technology discussing the above problems.     Medication Adjustments/Labs and Tests Ordered: Current medicines are reviewed at length with the patient today.  Concerns regarding medicines are outlined above.   Tests Ordered: No orders of the defined types were placed in this encounter.   Medication Changes: Meds ordered this encounter  Medications   carvedilol (COREG) 6.25 MG tablet    Sig: Take 1 tablet (6.25 mg total) by mouth 2 (two) times daily with a meal.    Dispense:  180 tablet     Refill:  3    Disposition:  Follow up with me in 3 months in the clnic  Signed, [name], MD  06/27/2018 9:50 AM    [place]

## 2018-06-27 ENCOUNTER — Telehealth (INDEPENDENT_AMBULATORY_CARE_PROVIDER_SITE_OTHER): Payer: Medicare Other | Admitting: Cardiology

## 2018-06-27 ENCOUNTER — Encounter: Payer: Self-pay | Admitting: Cardiology

## 2018-06-27 VITALS — BP 160/80 | HR 66 | Ht 71.0 in | Wt 163.0 lb

## 2018-06-27 DIAGNOSIS — Z7189 Other specified counseling: Secondary | ICD-10-CM | POA: Diagnosis not present

## 2018-06-27 DIAGNOSIS — I255 Ischemic cardiomyopathy: Secondary | ICD-10-CM

## 2018-06-27 DIAGNOSIS — E785 Hyperlipidemia, unspecified: Secondary | ICD-10-CM

## 2018-06-27 DIAGNOSIS — Z951 Presence of aortocoronary bypass graft: Secondary | ICD-10-CM

## 2018-06-27 DIAGNOSIS — I25810 Atherosclerosis of coronary artery bypass graft(s) without angina pectoris: Secondary | ICD-10-CM

## 2018-06-27 MED ORDER — CARVEDILOL 6.25 MG PO TABS: 6.1250 mg | ORAL_TABLET | Freq: Two times a day (BID) | ORAL | 3 refills | Status: DC

## 2018-06-27 NOTE — Patient Instructions (Signed)
Medication Instructions:  The current medical regimen is effective;  continue present plan and medications.  If you need a refill on your cardiac medications before your next appointment, please call your pharmacy.   Follow-Up: Please follow up in 3 months with Dr Percival Spanish in [place] as scheduled. (10/03/2018 at 9:20 am)  Thank you for choosing [facility]!!

## 2018-07-19 ENCOUNTER — Telehealth: Payer: Self-pay | Admitting: Family Medicine

## 2018-07-19 NOTE — Telephone Encounter (Signed)
Generic for claritin, take as directed on packaging.-thx

## 2018-07-19 NOTE — Telephone Encounter (Signed)
Please advise, thanks.

## 2018-07-19 NOTE — Telephone Encounter (Signed)
Left detailed message for pt to get generic claritin and take as directed on package, okay per DPR.

## 2018-07-19 NOTE — Telephone Encounter (Signed)
Patient is having trouble with hay fever symptoms when he works out in his shop or in the yard. It clears up when he is in his house at night. Patient would like to know what OTC medicine he can take that would not react with his Rx's.

## 2018-07-27 ENCOUNTER — Other Ambulatory Visit: Payer: Self-pay

## 2018-07-27 ENCOUNTER — Encounter: Payer: Self-pay | Admitting: Family Medicine

## 2018-07-27 ENCOUNTER — Ambulatory Visit (INDEPENDENT_AMBULATORY_CARE_PROVIDER_SITE_OTHER): Payer: Medicare Other | Admitting: Family Medicine

## 2018-07-27 VITALS — BP 160/84 | HR 54 | Temp 97.6°F | Resp 16 | Ht 71.0 in | Wt 156.8 lb

## 2018-07-27 DIAGNOSIS — I5022 Chronic systolic (congestive) heart failure: Secondary | ICD-10-CM | POA: Diagnosis not present

## 2018-07-27 DIAGNOSIS — I499 Cardiac arrhythmia, unspecified: Secondary | ICD-10-CM

## 2018-07-27 DIAGNOSIS — I10 Essential (primary) hypertension: Secondary | ICD-10-CM | POA: Diagnosis not present

## 2018-07-27 DIAGNOSIS — E7800 Pure hypercholesterolemia, unspecified: Secondary | ICD-10-CM

## 2018-07-27 DIAGNOSIS — Z0000 Encounter for general adult medical examination without abnormal findings: Secondary | ICD-10-CM

## 2018-07-27 LAB — CBC WITH DIFFERENTIAL/PLATELET
Basophils Absolute: 0.1 10*3/uL (ref 0.0–0.1)
Basophils Relative: 1 % (ref 0.0–3.0)
Eosinophils Absolute: 0.2 10*3/uL (ref 0.0–0.7)
Eosinophils Relative: 3.1 % (ref 0.0–5.0)
HCT: 46.2 % (ref 39.0–52.0)
Hemoglobin: 15.3 g/dL (ref 13.0–17.0)
Lymphocytes Relative: 25.9 % (ref 12.0–46.0)
Lymphs Abs: 1.4 10*3/uL (ref 0.7–4.0)
MCHC: 33.1 g/dL (ref 30.0–36.0)
MCV: 95.4 fl (ref 78.0–100.0)
Monocytes Absolute: 0.4 10*3/uL (ref 0.1–1.0)
Monocytes Relative: 8.3 % (ref 3.0–12.0)
Neutro Abs: 3.3 10*3/uL (ref 1.4–7.7)
Neutrophils Relative %: 61.7 % (ref 43.0–77.0)
Platelets: 144 10*3/uL — ABNORMAL LOW (ref 150.0–400.0)
RBC: 4.84 Mil/uL (ref 4.22–5.81)
RDW: 14.6 % (ref 11.5–15.5)
WBC: 5.3 10*3/uL (ref 4.0–10.5)

## 2018-07-27 LAB — LIPID PANEL
Cholesterol: 144 mg/dL (ref 0–200)
HDL: 31.5 mg/dL — ABNORMAL LOW (ref >39.00)
LDL Cholesterol: 102 mg/dL — ABNORMAL HIGH (ref 0–99)
NonHDL: 112.49
Total CHOL/HDL Ratio: 5
Triglycerides: 53 mg/dL (ref 0.0–149.0)
VLDL: 10.6 mg/dL (ref 0.0–40.0)

## 2018-07-27 LAB — COMPREHENSIVE METABOLIC PANEL
ALT: 14 U/L (ref 0–53)
AST: 20 U/L (ref 0–37)
Albumin: 4.2 g/dL (ref 3.5–5.2)
Alkaline Phosphatase: 60 U/L (ref 39–117)
BUN: 16 mg/dL (ref 6–23)
CO2: 29 mEq/L (ref 19–32)
Calcium: 9.2 mg/dL (ref 8.4–10.5)
Chloride: 104 mEq/L (ref 96–112)
Creatinine, Ser: 0.99 mg/dL (ref 0.40–1.50)
GFR: 74.05 mL/min (ref >60.00)
Glucose, Bld: 86 mg/dL (ref 70–99)
Potassium: 5 mEq/L (ref 3.5–5.1)
Sodium: 139 mEq/L (ref 135–145)
Total Bilirubin: 1.1 mg/dL (ref 0.2–1.2)
Total Protein: 6.2 g/dL (ref 6.0–8.3)

## 2018-07-27 LAB — PROTIME-INR
INR: 1.1 ratio — ABNORMAL HIGH (ref 0.8–1.0)
Prothrombin Time: 12.7 s (ref 9.6–13.1)

## 2018-07-27 MED ORDER — BENAZEPRIL HCL 40 MG PO TABS: ORAL_TABLET | ORAL | 6 refills | Status: DC

## 2018-07-27 MED ORDER — CARVEDILOL 6.25 MG PO TABS: 6.1250 mg | ORAL_TABLET | Freq: Two times a day (BID) | ORAL | 3 refills | Status: DC

## 2018-07-27 NOTE — Progress Notes (Signed)
Office Note 07/27/2018  CC:  Chief Complaint  Patient presents with  . Annual Exam    pt is fasting    HPI:  Daniel [name]. is a 73 y.o. White male with who is here for annual health maintenance exam. Feeling well.  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  Runs out of energy after working for a while.  He has generalized anxiety with anxiety-related insomnia for which he uses alprazolam hs.  He has consistently used this responsibly.  Has not been taking much lately at all.  He has not been checking bp at home. He has seen cardiology in the last few months.  No changes made. He had an adverse rxn to entresto.  He does feel periodic irregular heart beat.  Past Medical History:  Diagnosis Date  . Anxiety   . Atrial fibrillation ([place])    Limited episode, emergency room, June, 2013, spontaneous conversion to sinus rhythm, was evaluated By [name] 2013- no further visits.  Post-op (CABG) a-fib, converted on amio but pt self d/c'd this med due to DOE/side effect profile.  [name] CAD (coronary artery disease) 05/2016   a. 05/2016: NSTEMI with cath showing 3-vessel disease. Underwent CABG on 05/12/16 with LIMA-D1, SVG-LAD, and SVG-PDA.   [name] COPD (chronic obstructive pulmonary disease) ([place]) fall 2016   Bullous changes noted on lower lung images of CT abd done by urology  . Diverticulitis   . GERD (gastroesophageal reflux disease)   . Has received pneumococcal vaccination   . Hyperlipidemia    statin intolerant  . Hypertension   . Ischemic cardiomyopathy 05/2016   EF 20-25% at the time of NSTEMI/CABG.  Repeat EF 07/2016 30-35%.  ACE-I and potassium stopped and entresto and aldactone started 09/11/16>>then hyperkalemia so aldactone d/c'd.  07/2017 echo EF still 25-30%--Dr. Hochrein to began discussion of ICD need and also started benazapril and d/c'd hctz in late July 2019.  To get ICD soon as of 05/2018 cardiology f/u (EF still 25-30%).  . Microscopic hematuria Fall 2016   CT  nl except nonobstructing stones.  Cystoscopy normal 12/30/14 ([name])  . Nephrolithiasis    11 mm stone on R, 2 mm stone on L, ureters clear  . Non-STEMI (non-ST elevated myocardial infarction) ([place]) 05/2016   with impaired LV function  . 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  Urol ([name]) at [facility]; [name] referred him to [name] 03/2015: patient got bilat nerve sparing , robot assisted laparoscopic radical prostatectomy and pelvic lymphadenectomy.  Urol f/u, PSA undetectable on repeat 04/22/16 and 10/28/16; pt chose PSA surveill over adjuv rad tx.   . Tobacco dependence    down to 1-2 cigs per day as of 11/2015.  Restarted 08/2016.  [name] Urinary retention 05/2015   occurred s/p foley removal    Past Surgical History:  Procedure Laterality Date  . aortic ultrasound  07/2012   NO AAA  . COLONOSCOPY  approx 2011 per pt   Done by surgeon in [place] after a bout of diverticulitis; normal per pt report--was told to repeat in 10 yrs.  . CORONARY ARTERY BYPASS GRAFT N/A 05/12/2016   Procedure: CORONARY ARTERY BYPASS GRAFTING (CABG) TIMES THREE USING LEFT INTERNAL MAMMARY ARTERY AND RIGHT SAPHENOUS LEG VEIN HARVESTED ENDOSCOPICALLY;  Surgeon: [name], MD;  Location: [place];  Service: Open Heart Surgery;  Laterality: N/A;  . INGUINAL HERNIA REPAIR  2003 and 2004  Both sides.  [name] LEFT HEART CATH AND CORONARY ANGIOGRAPHY N/A 05/09/2016   Procedure: Left Heart Cath and Coronary Angiography;  Surgeon: [name], MD;  Location: [place] [facility];  Service: Cardiovascular;  Laterality: N/A;  . LITHOTRIPSY  2004   Dr. Maryland Pink in [place].  [name] LYMPHADENECTOMY Bilateral 04/30/2015   Procedure: PELVIC LYMPHADENECTOMY;  Surgeon: [name], MD;  Location: [facility];  Service: Urology;  Laterality: Bilateral;  . PROSTATE BIOPSY  02/05/15   Prostate adenocarcinoma: pt considering treatment options as of 02/19/15  . ROBOT ASSISTED LAPAROSCOPIC  RADICAL PROSTATECTOMY N/A 04/30/2015   Procedure: XI ROBOTIC ASSISTED LAPAROSCOPIC RADICAL PROSTATECTOMY LEVEL 2;  Surgeon: [name], MD;  [facility];  Service: Urology;  Laterality: N/A;  . TEE WITHOUT CARDIOVERSION N/A 05/12/2016   Procedure: TRANSESOPHAGEAL ECHOCARDIOGRAM (TEE);  Surgeon: [name], MD;  Location: [place];  Service: Open Heart Surgery;  Laterality: N/A;  . TRANSTHORACIC ECHOCARDIOGRAM  03/22/2006; 05/2016; 07/2016; 07/2017   2008: EF 65%, normal valves, no wall motion abnormalties, LA size normal.  05/2016 in context of non-STEMI, EF 20-25%, mild AV and MV regurg.  08/05/16: EF 30-35%, akinesis of mid lateral myoc, grd II DD, mild aortic 07/2017: EF 25-30%, mult areas of akinesis, grd I DD.    Family History  Problem Relation Age of Onset  . Hypertension Mother   . Cancer - Other Mother 53       breast  . Cancer Father        Lung ca age 31  . Diabetes Sister     Social History   Socioeconomic History  . Marital status: Married    Spouse name: Not on file  . Number of children: Not on file  . Years of education: Not on file  . Highest education level: Not on file  Occupational History  . Not on file  Social Needs  . Financial resource strain: Not on file  . Food insecurity    Worry: Not on file    Inability: Not on file  . Transportation needs    Medical: Not on file    Non-medical: Not on file  Tobacco Use  . Smoking status: Former Smoker    Packs/day: 1.00    Years: 55.00    Pack years: 55.00    Types: Cigarettes  . Smokeless tobacco: Never Used  . Tobacco comment: STOPPED AT SURGERY  Substance and Sexual Activity  . Alcohol use: No  . Drug use: No  . Sexual activity: Not on file  Lifestyle  . Physical activity    Days per week: Not on file    Minutes per session: Not on file  . Stress: Not on file  Relationships  . Social Herbalist on phone: Not on file    Gets together: Not on file    Attends religious service: Not  on file    Active member of club or organization: Not on file    Attends meetings of clubs or organizations: Not on file    Relationship status: Not on file  . Intimate partner violence    Fear of current or ex partner: Not on file    Emotionally abused: Not on file    Physically abused: Not on file    Forced sexual activity: Not on file  Other Topics Concern  . Not on file  Social History Narrative   Married, 2 grown children.   HS education.  Orig from [place], lives  there now.   Occupation: maintenance.  Drives a tractor a lot, drives a pickup truck a lot, rides horses a lot.   Tobacco: 20 pack-yr hx (current as of 07/2012)   No drugs.   Alcohol: none    Outpatient Medications Prior to Visit  Medication Sig Dispense Refill  . ALPRAZolam (XANAX) 0.25 MG tablet TAKE ONE TABLET BY MOUTH AT BEDTIME AS NEEDED 30 tablet 5  . aspirin 81 MG tablet Take 1 tablet (81 mg total) by mouth daily.    [name] Fexofenadine HCl (ALLEGRA PO) Take 50 mg by mouth.    . Omega-3 Fatty Acids (FISH OIL) 1000 MG CAPS Take 1 capsule by mouth daily.    [name] spironolactone (ALDACTONE) 25 MG tablet Take 1 tablet (25 mg total) by mouth daily. (Patient taking differently: 25 mg. Take 1/2 tablet daily) 90 tablet 3  . Vitamin D, Cholecalciferol, 400 units CAPS Take 400 Units by mouth daily.     . benazepril (LOTENSIN) 40 MG tablet 40 mg. Take 1/2 tablet by mouth twice daily    . carvedilol (COREG) 6.25 MG tablet Take 1 tablet (6.25 mg total) by mouth 2 (two) times daily with a meal. 180 tablet 3  . furosemide (LASIX) 20 MG tablet Take 20mg  (1 tablet) as needed for swelling. (Patient not taking: Reported on 07/27/2018) 30 tablet 3   No facility-administered medications prior to visit.     Allergies  Allergen Reactions  . Entresto [Sacubitril-Valsartan] Hives and Shortness Of Breath  . Amlodipine Swelling    LE swelling  . Contrast Media [Iodinated Diagnostic Agents] Other (See Comments)    Prostate problem had to  wear a catheter for two weeks after having dye.   [name] Hydralazine Palpitations and Other (See Comments)    Fatigue+    ROS Review of Systems  Constitutional: Negative for appetite change, chills, fatigue and fever.  HENT: Negative for congestion, dental problem, ear pain and sore throat.   Eyes: Negative for discharge, redness and visual disturbance.  Respiratory: Positive for shortness of breath (after exertion-stable). Negative for cough, chest tightness and wheezing.   Cardiovascular: Positive for palpitations. Negative for chest pain and leg swelling.  Gastrointestinal: Negative for abdominal pain, blood in stool, diarrhea, nausea and vomiting.  Genitourinary: Negative for difficulty urinating, dysuria, flank pain, frequency, hematuria and urgency.  Musculoskeletal: Negative for arthralgias, back pain, joint swelling, myalgias and neck stiffness.  Skin: Negative for pallor and rash.  Neurological: Negative for dizziness, speech difficulty, weakness and headaches.  Hematological: Negative for adenopathy. Does not bruise/bleed easily.  Psychiatric/Behavioral: Negative for confusion and sleep disturbance. The patient is not nervous/anxious.     PE; Blood pressure (!) 160/84, pulse (!) 54, temperature 97.6 F (36.4 C), temperature source Temporal, resp. rate 16, height 5\' 11"  (1.803 m), weight 156 lb 12.8 oz (71.1 kg), SpO2 100 %. Gen: Alert, well appearing.  Patient is oriented to person, place, time, and situation. AFFECT: pleasant, lucid thought and speech. ENT: Ears: EACs clear, normal epithelium.  TMs with good light reflex and landmarks bilaterally.  Eyes: no injection, icteris, swelling, or exudate.  EOMI, PERRLA. Nose: no drainage or turbinate edema/swelling.  No injection or focal lesion.  Mouth: lips without lesion/swelling.  Oral mucosa pink and moist.  Dentition intact and without obvious caries or gingival swelling.  Oropharynx without erythema, exudate, or swelling.  Neck:  supple/nontender.  No LAD, mass, or TM.  Carotid pulses 2+ bilaterally, without bruits. CV: irregular heart rhythm, rate approx 60,  no m/r/g.   LUNGS: CTA bilat, nonlabored resps, good aeration in all lung fields. ABD: soft, NT, ND, BS normal.  No hepatospenomegaly or mass.  No bruits. EXT: no clubbing, cyanosis.  1 + pitting edema R LL, trace pitting edema L LL. Musculoskeletal: no joint swelling, erythema, warmth, or tenderness.  ROM of all joints intact. Skin - no sores or suspicious lesions or rashes or color changes   Pertinent labs:    Lab Results  Component Value Date   TSH 0.68 01/26/2018   Lab Results  Component Value Date   WBC 4.9 01/26/2018   HGB 15.8 01/26/2018   HCT 46.3 01/26/2018   MCV 94.0 01/26/2018   PLT 152.0 01/26/2018   Lab Results  Component Value Date   CREATININE 0.87 01/26/2018   BUN 17 01/26/2018   NA 139 01/26/2018   K 4.3 01/26/2018   CL 106 01/26/2018   CO2 24 01/26/2018   Lab Results  Component Value Date   ALT 12 07/12/2017   AST 19 07/12/2017   ALKPHOS 65 07/12/2017   BILITOT 1.0 07/12/2017   Lab Results  Component Value Date   CHOL 167 07/12/2017   Lab Results  Component Value Date   HDL 34.90 (L) 07/12/2017   Lab Results  Component Value Date   LDLCALC 117 (H) 07/12/2017   Lab Results  Component Value Date   TRIG 74.0 07/12/2017   Lab Results  Component Value Date   CHOLHDL 5 07/12/2017   Lab Results  Component Value Date   PSA <0.02 10/15/2015   PSA 17.82 (H) 12/22/2014   PSA 13.92 (H) 11/14/2014   No results found for: HGBA1C  12 lead EKG today: sinus brady, rate 52, 1st deg AV block, occ ectopic ventricular beat.  No ischemic changes.  No hypertrophy. Compared to EKG 01/26/18, PACs resolved but PVCs new.  ASSESSMENT AND PLAN:   1) Irregular heart rhythm:  Sinus brady with PVCs on EKG today.   No new interventions.  2) Uncontrolled HTN: increase benazepril to 40mg  bid. BMET repeat in 1 week.  3)  Anxiety induced insomnia: has not required much alprazolam lately. CSC UTD.    4) Health maintenance exam: Reviewed age and gender appropriate health maintenance issues (prudent diet, regular exercise, health risks of tobacco and excessive alcohol, use of seatbelts, fire alarms in home, use of sunscreen).  Also reviewed age and gender appropriate health screening as well as vaccine recommendations. Vaccines: pt declined pneumovax 23 and shingrix vaccines last cpe a year ago-->he continues to decline these. Labs: cMET, FLP, CBC. Prostate ca screening: n/a->he is followed by urol for hx of prostate ca. Colon ca screening: next colonoscopy due 2021.  An After Visit Summary was printed and given to the patient.  FOLLOW UP:  Return for lab visit (nonfasting) 1 week recheck BMET.  Office visit 1 mo f/u HTN.  Signed:  Crissie Sickles, MD           07/27/2018

## 2018-07-27 NOTE — Patient Instructions (Signed)

## 2018-07-30 ENCOUNTER — Other Ambulatory Visit: Payer: Self-pay | Admitting: Family Medicine

## 2018-07-30 DIAGNOSIS — I10 Essential (primary) hypertension: Secondary | ICD-10-CM

## 2018-08-02 ENCOUNTER — Other Ambulatory Visit: Payer: Self-pay

## 2018-08-02 ENCOUNTER — Ambulatory Visit (INDEPENDENT_AMBULATORY_CARE_PROVIDER_SITE_OTHER): Payer: Medicare Other | Admitting: Family Medicine

## 2018-08-02 DIAGNOSIS — I10 Essential (primary) hypertension: Secondary | ICD-10-CM

## 2018-08-02 LAB — BASIC METABOLIC PANEL
BUN: 14 mg/dL (ref 6–23)
CO2: 27 mEq/L (ref 19–32)
Calcium: 8.9 mg/dL (ref 8.4–10.5)
Chloride: 104 mEq/L (ref 96–112)
Creatinine, Ser: 1.06 mg/dL (ref 0.40–1.50)
GFR: 68.43 mL/min (ref >60.00)
Glucose, Bld: 104 mg/dL — ABNORMAL HIGH (ref 70–99)
Potassium: 4.2 mEq/L (ref 3.5–5.1)
Sodium: 139 mEq/L (ref 135–145)

## 2018-08-30 ENCOUNTER — Ambulatory Visit (INDEPENDENT_AMBULATORY_CARE_PROVIDER_SITE_OTHER): Payer: Medicare Other | Admitting: Family Medicine

## 2018-08-30 ENCOUNTER — Encounter: Payer: Self-pay | Admitting: Family Medicine

## 2018-08-30 ENCOUNTER — Other Ambulatory Visit: Payer: Self-pay

## 2018-08-30 VITALS — BP 171/72 | HR 51 | Temp 98.4°F | Resp 16 | Ht 71.0 in | Wt 167.2 lb

## 2018-08-30 DIAGNOSIS — I10 Essential (primary) hypertension: Secondary | ICD-10-CM

## 2018-08-30 NOTE — Progress Notes (Signed)
OFFICE VISIT  08/30/2018   CC:  Chief Complaint  Patient presents with  . Follow-up    hypertension   HPI:    Patient is a 73 y.o. Caucasian male who presents for 1 mo f/u HTN. Increased benazepril to 40 mg bid 1 mo ago. F/u lytes/cr 1 week later was stable.  Checks bp every morning before BF: 170s syst at this tiime. A couple hours later it goes to 135-140/60-65. BP often 130/70 in evenings. He says when his bp gets down to 135/60s or less he feels "fatigued and can't function".  Denies CP.  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  No LE swelling.  Past Medical History:  Diagnosis Date  . Anxiety   . Atrial fibrillation ([place])    Limited episode, emergency room, June, 2013, spontaneous conversion to sinus rhythm, was evaluated By [name] 2013- no further visits.  Post-op (CABG) a-fib, converted on amio but pt self d/c'd this med due to DOE/side effect profile.  [name] CAD (coronary artery disease) 05/2016   a. 05/2016: NSTEMI with cath showing 3-vessel disease. Underwent CABG on 05/12/16 with LIMA-D1, SVG-LAD, and SVG-PDA.   [name] COPD (chronic obstructive pulmonary disease) ([place]) fall 2016   Bullous changes noted on lower lung images of CT abd done by urology  . Diverticulitis   . GERD (gastroesophageal reflux disease)   . Has received pneumococcal vaccination   . Hyperlipidemia    statin intolerant  . Hypertension   . Ischemic cardiomyopathy 05/2016   EF 20-25% at the time of NSTEMI/CABG.  Repeat EF 07/2016 30-35%.  ACE-I and potassium stopped and entresto and aldactone started 09/11/16>>then hyperkalemia so aldactone d/c'd.  07/2017 echo EF still 25-30%--Dr. Hochrein to began discussion of ICD need and also started benazapril and d/c'd hctz in late July 2019.  To get ICD soon as of 05/2018 cardiology f/u (EF still 25-30%).  . Microscopic hematuria Fall 2016   CT nl except nonobstructing  stones.  Cystoscopy normal 12/30/14 ([name])  . Nephrolithiasis    11 mm stone on R, 2 mm stone on L, ureters clear  . Non-STEMI (non-ST elevated myocardial infarction) ([place]) 05/2016   with impaired LV function  . Prostatic adenocarcinoma ([place]) 02/2015   No evidence of metastatic dz on CT pelv 02/2015.  Urol ([name]) at [facility]; [name] referred him to [name] 03/2015: patient got bilat nerve sparing , robot assisted laparoscopic radical prostatectomy and pelvic lymphadenectomy.  Urol f/u, PSA undetectable on repeat 04/22/16 and 10/28/16; pt chose PSA surveill over adjuv rad tx.   . Tobacco dependence    down to 1-2 cigs per day as of 11/2015.  Restarted 08/2016.  [name] Urinary retention 05/2015   occurred s/p foley removal    Past Surgical History:  Procedure Laterality Date  . aortic ultrasound  07/2012   NO AAA  . COLONOSCOPY  approx 2011 per pt   Done by surgeon in [place] after a bout of diverticulitis; normal per pt report--was told to repeat in 10 yrs.  . CORONARY ARTERY BYPASS GRAFT N/A 05/12/2016   Procedure: CORONARY ARTERY BYPASS GRAFTING (CABG) TIMES THREE USING LEFT INTERNAL MAMMARY ARTERY AND RIGHT SAPHENOUS LEG VEIN HARVESTED ENDOSCOPICALLY;  Surgeon: [name], MD;  Location: [place];  Service: Open Heart Surgery;  Laterality: N/A;  . INGUINAL HERNIA REPAIR  2003 and 2004   Both sides.  [name] LEFT HEART CATH AND CORONARY ANGIOGRAPHY N/A 05/09/2016   Procedure: Left Heart Cath and Coronary Angiography;  Surgeon: [name], MD;  Location: [place] [facility];  Service: Cardiovascular;  Laterality: N/A;  . LITHOTRIPSY  2004   Dr. Maryland Pink in [place].  [name] LYMPHADENECTOMY Bilateral 04/30/2015   Procedure: PELVIC LYMPHADENECTOMY;  Surgeon: [name], MD;  [facility];  Service: Urology;  Laterality: Bilateral;  . PROSTATE BIOPSY  02/05/15   Prostate adenocarcinoma: pt considering treatment options as of 02/19/15  . ROBOT ASSISTED LAPAROSCOPIC RADICAL PROSTATECTOMY  N/A 04/30/2015   Procedure: XI ROBOTIC ASSISTED LAPAROSCOPIC RADICAL PROSTATECTOMY LEVEL 2;  Surgeon: [name], MD;  [facility];  Service: Urology;  Laterality: N/A;  . TEE WITHOUT CARDIOVERSION N/A 05/12/2016   Procedure: TRANSESOPHAGEAL ECHOCARDIOGRAM (TEE);  Surgeon: [name], MD;  Location: [place];  Service: Open Heart Surgery;  Laterality: N/A;  . TRANSTHORACIC ECHOCARDIOGRAM  03/22/2006; 05/2016; 07/2016; 07/2017   2008: EF 65%, normal valves, no wall motion abnormalties, LA size normal.  05/2016 in context of non-STEMI, EF 20-25%, mild AV and MV regurg.  08/05/16: EF 30-35%, akinesis of mid lateral myoc, grd II DD, mild aortic 07/2017: EF 25-30%, mult areas of akinesis, grd I DD.    Outpatient Medications Prior to Visit  Medication Sig Dispense Refill  . ALPRAZolam (XANAX) 0.25 MG tablet TAKE ONE TABLET BY MOUTH AT BEDTIME AS NEEDED 30 tablet 5  . aspirin 81 MG tablet Take 1 tablet (81 mg total) by mouth daily.    . benazepril (LOTENSIN) 40 MG tablet Take 1 whole tablet by mouth twice daily 60 tablet 6  . carvedilol (COREG) 6.25 MG tablet Take 1 tablet (6.25 mg total) by mouth 2 (two) times daily with a meal. 180 tablet 3  . Fexofenadine HCl (ALLEGRA PO) Take 50 mg by mouth.    . furosemide (LASIX) 20 MG tablet Take 20mg  (1 tablet) as needed for swelling. 30 tablet 3  . loratadine (CLARITIN) 10 MG tablet Take 10 mg by mouth as needed for allergies.    . Omega-3 Fatty Acids (FISH OIL) 1000 MG CAPS Take 1 capsule by mouth daily.    [name] spironolactone (ALDACTONE) 25 MG tablet Take 1 tablet (25 mg total) by mouth daily. (Patient taking differently: 25 mg. Take 1/2 tablet daily) 90 tablet 3  . Vitamin D, Cholecalciferol, 400 units CAPS Take 400 Units by mouth daily.      No facility-administered medications prior to visit.     Allergies  Allergen Reactions  . Entresto [Sacubitril-Valsartan] Hives and Shortness Of Breath  . Amlodipine Swelling    LE swelling  . Contrast Media  [Iodinated Diagnostic Agents] Other (See Comments)    Prostate problem had to wear a catheter for two weeks after having dye.   [name] Hydralazine Palpitations and Other (See Comments)    Fatigue+    ROS As per HPI  PE: Blood pressure (!) 171/72, pulse (!) 51, temperature 98.4 F (36.9 C), temperature source Temporal, resp. rate 16, height 5\' 11"  (1.803 m), weight 167 lb 3.2 oz (75.8 kg), SpO2 99 %. Gen: Alert, well appearing.  Patient is oriented to person, place, time, and situation. AFFECT: pleasant, lucid thought and speech. No further exam today.  LABS:    Chemistry      Component Value Date/Time   NA 139 08/02/2018 0933   NA 142 10/25/2017 1128   K 4.2 08/02/2018 0933  CL 104 08/02/2018 0933   CO2 27 08/02/2018 0933   BUN 14 08/02/2018 0933   BUN 14 10/25/2017 1128   CREATININE 1.06 08/02/2018 0933   CREATININE 0.95 06/20/2017 1436      Component Value Date/Time   CALCIUM 8.9 08/02/2018 0933   ALKPHOS 60 07/27/2018 1009   AST 20 07/27/2018 1009   ALT 14 07/27/2018 1009   BILITOT 1.1 07/27/2018 1009   BILITOT 0.8 09/16/2016 0858      IMPRESSION AND PLAN:  1) Uncontrolled HTN: bp not ideal in AM, but reasonable later in morning and throughout the remainder of his day usually.  Since he feels fatigued with bp near normal range I have to err on the high side at this time. No med changes today. He has f/u with Dr. Percival Spanish on 10/03/18.  An After Visit Summary was printed and given to the patient.  FOLLOW UP: Return in about 6 months (around 03/02/2019) for routine chronic illness f/u.  Signed:  Crissie Sickles, MD           08/30/2018

## 2018-09-26 ENCOUNTER — Encounter: Payer: Self-pay | Admitting: Family Medicine

## 2018-09-30 DIAGNOSIS — I5022 Chronic systolic (congestive) heart failure: Secondary | ICD-10-CM | POA: Insufficient documentation

## 2018-09-30 DIAGNOSIS — E785 Hyperlipidemia, unspecified: Secondary | ICD-10-CM | POA: Insufficient documentation

## 2018-09-30 DIAGNOSIS — I2510 Atherosclerotic heart disease of native coronary artery without angina pectoris: Secondary | ICD-10-CM | POA: Insufficient documentation

## 2018-09-30 DIAGNOSIS — Z720 Tobacco use: Secondary | ICD-10-CM | POA: Insufficient documentation

## 2018-09-30 NOTE — Progress Notes (Signed)
Cardiology Office Note   Date:  10/03/2018   ID:  Daniel [name]., DOB 07-Apr-1945, MRN [id number]  PCP:  Daniel Sou, MD  Cardiologist:   Daniel Breeding, MD   Chief Complaint  Patient presents with  . Extremity Weakness      History of Present Illness: Daniel [name]. is a 73 y.o. male who presents for follow up of CAD s/p CABG LIMA to D1, SVG to LAD, SVG to PDA on 05/12/2016, HLD intolerant to statin, HTN, ICM with baseline EF 30-35%, and h/o prostate CA.His last cardiac catheterization on 05/09/2016 showed EF 20-25%, akinesis of the distal inferoapical segment with significant hypokinesis globally consistent with ischemiccardiomyopathy, multivessel disease with 70% followed by total occlusion of large LAD with distal collateral arteries, 50% proximal left circumflex stenosis with total occlusion of distal left circumflex, total occlusion of mid RCA with distal collateral artery. Bypass surgery was recommended and he eventually underwent successful CABG 3 by Dr. Roxan [name] on 05/12/2016 with LIMA to diagonal, SVG to LAD, SVG to PDA. He developed postoperative atrial fibrillation and was treated with IV amiodarone with conversion to sinus rhythm prior to discharge. He had a follow-up echocardiogram on 08/05/2016 which revealed LV EF remains low at 30-35% however slightly improved from his previous 20-25%.  He was placed on carvedilol and Entresto, spironolactone was discontinued due to elevated potassium level. Unfortunately, he went back to the hospital on 11/20/2016 with shortness breath, diffuse rash and presyncope. He was given prednisone for his rash. He stopped the [facility] and restarted ACE inhibitor as he was sure the rash was related to the former.  Last EF in July was 25 - 30%.    At the last visit I started spironolactone.  We discussed an ICD but he wanted to wait on this.  Initially he wanted to wait for his wife to finish chemo but now he wants to wait until the virus is  resolved.  He feels well.  He is on his feet 12 hours a day he says.  He says his feet feel heavier when he works in used to.  He seems to tolerate the spironolactone I started.  He is not had any presyncope or syncope.  Denies any chest pressure, neck or arm discomfort.  He said no new shortness of breath, PND or orthopnea.  He has had no weight gain or edema.   Past Medical History:  Diagnosis Date  . Anxiety   . Atrial fibrillation ([place])    Limited episode, emergency room, June, 2013, spontaneous conversion to sinus rhythm, was evaluated By Dr. Ron [name] 2013- no further visits.  Post-op (CABG) a-fib, converted on amio but pt self d/c'd this med due to DOE/side effect profile.  Daniel [name] CAD (coronary artery disease) 05/2016   a. 05/2016: NSTEMI with cath showing 3-vessel disease. Underwent CABG on 05/12/16 with LIMA-D1, SVG-LAD, and SVG-PDA.   Daniel [name] COPD (chronic obstructive pulmonary disease) ([place]) fall 2016   Bullous changes noted on lower lung images of CT abd done by urology  . Diverticulitis   . GERD (gastroesophageal reflux disease)   . Has received pneumococcal vaccination   . Hyperlipidemia    statin intolerant  . Hypertension   . Ischemic cardiomyopathy 05/2016   EF 20-25% at the time of NSTEMI/CABG.  Repeat EF 07/2016 30-35%.  ACE-I and potassium stopped and entresto and aldactone started 09/11/16>>then hyperkalemia so aldactone d/c'd.  07/2017 echo EF still 25-30%--Daniel [name] to began discussion of ICD need  and also started benazapril and d/c'd hctz in late July 2019. Pt decl ICD as of 06/2018 cards f/u  . Microscopic hematuria Fall 2016   CT nl except nonobstructing stones.  Cystoscopy normal 12/30/14 (Daniel [name])  . Nephrolithiasis    11 mm stone on R, 2 mm stone on L, ureters clear  . Non-STEMI (non-ST elevated myocardial infarction) ([place]) 05/2016   with impaired LV function  . 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  Urol (Daniel [name]) at  [facility]; Daniel [name] referred him to Daniel [name] 03/2015: patient got bilat nerve sparing , robot assisted laparoscopic radical prostatectomy and pelvic lymphadenectomy.  Urol f/u, PSA undetectable on repeat 04/22/16 and 10/28/16; pt chose PSA surveill over adjuv rad tx.   . Tobacco dependence    down to 1-2 cigs per day as of 11/2015.  Restarted 08/2016.  Daniel [name] Urinary retention 05/2015   occurred s/p foley removal    Past Surgical History:  Procedure Laterality Date  . aortic ultrasound  07/2012   NO AAA  . COLONOSCOPY  approx 2011 per pt   Done by surgeon in [place] after a bout of diverticulitis; normal per pt report--was told to repeat in 10 yrs.  . CORONARY ARTERY BYPASS GRAFT N/A 05/12/2016   Procedure: CORONARY ARTERY BYPASS GRAFTING (CABG) TIMES THREE USING LEFT INTERNAL MAMMARY ARTERY AND RIGHT SAPHENOUS LEG VEIN HARVESTED ENDOSCOPICALLY;  Surgeon: Daniel Nakayama, MD;  Location: [place];  Service: Open Heart Surgery;  Laterality: N/A;  . INGUINAL HERNIA REPAIR  2003 and 2004   Both sides.  Daniel [name] LEFT HEART CATH AND CORONARY ANGIOGRAPHY N/A 05/09/2016   Procedure: Left Heart Cath and Coronary Angiography;  Surgeon: Daniel Sine, MD;  Location: [place] CV [facility];  Service: Cardiovascular;  Laterality: N/A;  . LITHOTRIPSY  2004   Dr. Maryland Pink in [place].  Daniel [name] LYMPHADENECTOMY Bilateral 04/30/2015   Procedure: PELVIC LYMPHADENECTOMY;  Surgeon: Daniel Bring, MD;  [facility];  Service: Urology;  Laterality: Bilateral;  . PROSTATE BIOPSY  02/05/15   Prostate adenocarcinoma: pt considering treatment options as of 02/19/15  . ROBOT ASSISTED LAPAROSCOPIC RADICAL PROSTATECTOMY N/A 04/30/2015   Procedure: XI ROBOTIC ASSISTED LAPAROSCOPIC RADICAL PROSTATECTOMY LEVEL 2;  Surgeon: Daniel Bring, MD;  [facility];  Service: Urology;  Laterality: N/A;  . TEE WITHOUT CARDIOVERSION N/A 05/12/2016   Procedure: TRANSESOPHAGEAL ECHOCARDIOGRAM (TEE);  Surgeon: Daniel Nakayama, MD;  Location: [place];   Service: Open Heart Surgery;  Laterality: N/A;  . TRANSTHORACIC ECHOCARDIOGRAM  03/22/2006; 05/2016; 07/2016; 07/2017   2008: EF 65%, normal valves, no wall motion abnormalties, LA size normal.  05/2016 in context of non-STEMI, EF 20-25%, mild AV and MV regurg.  08/05/16: EF 30-35%, akinesis of mid lateral myoc, grd II DD, mild aortic 07/2017: EF 25-30%, mult areas of akinesis, grd I DD.     Current Outpatient Medications  Medication Sig Dispense Refill  . ALPRAZolam (XANAX) 0.25 MG tablet TAKE ONE TABLET BY MOUTH AT BEDTIME AS NEEDED 30 tablet 5  . aspirin 81 MG tablet Take 1 tablet (81 mg total) by mouth daily.    . benazepril (LOTENSIN) 40 MG tablet Take 1 whole tablet by mouth twice daily 60 tablet 6  . carvedilol (COREG) 6.25 MG tablet Take 1 tablet (6.25 mg total) by mouth 2 (two) times daily with a meal. 180 tablet 3  . furosemide (LASIX) 20 MG tablet Take 20mg  (1 tablet) as needed for  swelling. 30 tablet 3  . loratadine (CLARITIN) 10 MG tablet Take 10 mg by mouth as needed for allergies.    . Omega-3 Fatty Acids (FISH OIL) 1000 MG CAPS Take 1 capsule by mouth daily.    Daniel [name] spironolactone (ALDACTONE) 25 MG tablet Take 1 tablet (25 mg total) by mouth daily. (Patient taking differently: 25 mg. Take 1/2 tablet daily) 90 tablet 3  . Vitamin D, Cholecalciferol, 400 units CAPS Take 400 Units by mouth daily.     . isosorbide-hydrALAZINE (BIDIL) 20-37.5 MG tablet Take 1 tablet by mouth 2 (two) times daily. 180 tablet 3   No current facility-administered medications for this visit.     Allergies:   Entresto [sacubitril-valsartan], Amlodipine, Contrast media [iodinated diagnostic agents], and Hydralazine    ROS:  Please see the history of present illness.   Otherwise, review of systems are positive for none.   All other systems are reviewed and negative.    PHYSICAL EXAM: VS:  BP (!) 170/80   Pulse (!) 42   Ht 5\' 11"  (1.803 m)   Wt 167 lb (75.8 kg)   BMI 23.29 kg/m  , BMI Body mass index is  23.29 kg/m.  GENERAL:  Well appearing NECK:  No jugular venous distention, waveform within normal limits, carotid upstroke brisk and symmetric, no bruits, no thyromegaly LUNGS:  Clear to auscultation bilaterally CHEST:  Unremarkable HEART:  PMI not displaced or sustained,S1 and S2 within normal limits, no S3, no S4, no clicks, no rubs, no murmurs ABD:  Flat, positive bowel sounds normal in frequency in pitch, no bruits, no rebound, no guarding, no midline pulsatile mass, no hepatomegaly, no splenomegaly EXT:  2 plus pulses throughout, no edema, no cyanosis no clubbing  EKG:  EKG is not done today  Recent Labs: 01/26/2018: TSH 0.68 07/27/2018: ALT 14; Hemoglobin 15.3; Platelets 144.0 08/02/2018: BUN 14; Creatinine, Ser 1.06; Potassium 4.2; Sodium 139    Lipid Panel    Component Value Date/Time   CHOL 144 07/27/2018 1009   CHOL 137 09/16/2016 0858   TRIG 53.0 07/27/2018 1009   HDL 31.50 (L) 07/27/2018 1009   HDL 35 (L) 09/16/2016 0858   CHOLHDL 5 07/27/2018 1009   VLDL 10.6 07/27/2018 1009   LDLCALC 102 (H) 07/27/2018 1009   LDLCALC 88 09/16/2016 0858      Wt Readings from Last 3 Encounters:  10/03/18 167 lb (75.8 kg)  08/30/18 167 lb 3.2 oz (75.8 kg)  07/27/18 156 lb 12.8 oz (71.1 kg)      Other studies Reviewed: Additional studies/ records that were reviewed today include: Labs Review of the above records demonstrates:  See below.  ASSESSMENT AND PLAN:  CAD s/p CABG:    The patient has no new sypmtoms.  No further cardiovascular testing is indicated.  We will continue with aggressive risk reduction and meds as listed.  Ischemic cardiomyopathy:       EF remains low at 25 - 30%.   He needs to consider an ICD.    I talked to him again about this today.  I think he is warming up to the idea but now wants to wait until after the pandemic is over.  I will try to talk him over this in the next visits.  Today because his pressure allows and because it is too I am to start BiDil.   H  Hypertension:   This is being managed in the context of treating his CHF.  Start BiDil or the generic  equivalent if it is too expensive.  Hyperlipidemia: Lipids as above.  No change in therapy.  PAF:    He had no symptomatic complaints of this.  Tobacco : He is not smoking.  Current medicines are reviewed at length with the patient today.  The patient does not have concerns regarding medicines.  The following changes have been made: As above  Labs/ tests ordered today include: None  No orders of the defined types were placed in this encounter.    Disposition:   FU with me in 2 months.      Signed, Daniel Breeding, MD  10/03/2018 10:07 AM    [place]

## 2018-10-03 ENCOUNTER — Encounter: Payer: Self-pay | Admitting: Cardiology

## 2018-10-03 ENCOUNTER — Ambulatory Visit: Payer: Medicare Other | Admitting: Cardiology

## 2018-10-03 ENCOUNTER — Other Ambulatory Visit: Payer: Self-pay

## 2018-10-03 VITALS — BP 170/80 | HR 42 | Ht 71.0 in | Wt 167.0 lb

## 2018-10-03 DIAGNOSIS — Z720 Tobacco use: Secondary | ICD-10-CM | POA: Diagnosis not present

## 2018-10-03 DIAGNOSIS — I5022 Chronic systolic (congestive) heart failure: Secondary | ICD-10-CM

## 2018-10-03 DIAGNOSIS — E785 Hyperlipidemia, unspecified: Secondary | ICD-10-CM | POA: Diagnosis not present

## 2018-10-03 DIAGNOSIS — I480 Paroxysmal atrial fibrillation: Secondary | ICD-10-CM | POA: Diagnosis not present

## 2018-10-03 DIAGNOSIS — I2510 Atherosclerotic heart disease of native coronary artery without angina pectoris: Secondary | ICD-10-CM

## 2018-10-03 MED ORDER — ISOSORB DINITRATE-HYDRALAZINE 20-37.5 MG PO TABS: 1.0000 | ORAL_TABLET | Freq: Two times a day (BID) | ORAL | 3 refills | Status: DC

## 2018-10-03 NOTE — Patient Instructions (Signed)
Medication Instructions:  Please start Bidil 20-37.5 mg one tablet twice a day. Continue all other medications as listed.  If you need a refill on your cardiac medications before your next appointment, please call your pharmacy.   Follow-Up: Follow up in 2 months with Dr Percival Spanish.  Thank you for choosing [place]!!

## 2018-11-02 LAB — PSA: PSA: 0.028

## 2018-11-26 ENCOUNTER — Encounter: Payer: Self-pay | Admitting: Family Medicine

## 2018-12-03 NOTE — Progress Notes (Signed)
Cardiology Office Note   Date:  12/05/2018   ID:  Daniel Sandhoff., DOB [birth year], MRN [id number]  PCP:  [name], MD  Cardiologist:   [name], MD   No chief complaint on file.     History of Present Illness: Daniel Schmick. is a 73 y.o. male who presents for follow up of CAD s/p CABG LIMA to D1, SVG to LAD, SVG to PDA on 05/12/2016, HLD intolerant to statin, HTN, ICM with baseline EF 30-35%, and h/o prostate CA.His last cardiac catheterization on 05/09/2016 showed EF 20-25%, akinesis of the distal inferoapical segment with significant hypokinesis globally consistent with ischemiccardiomyopathy, multivessel disease with 70% followed by total occlusion of large LAD with distal collateral arteries, 50% proximal left circumflex stenosis with total occlusion of distal left circumflex, total occlusion of mid RCA with distal collateral artery. Bypass surgery was recommended and he eventually underwent successful CABG 3 by Dr. Roxan Hockey on 05/12/2016 with LIMA to diagonal, SVG to LAD, SVG to PDA. He developed postoperative atrial fibrillation and was treated with IV amiodarone with conversion to sinus rhythm prior to discharge. He had a follow-up echocardiogram on 08/05/2016 which revealed LV EF remains low at 30-35% however slightly improved from his previous 20-25%.  He was placed on carvedilol and Entresto, spironolactone was discontinued due to elevated potassium level. Unfortunately, he went back to the hospital on 11/20/2016 with shortness breath, diffuse rash and presyncope. He was given prednisone for his rash. He stopped the [facility] and restarted ACE inhibitor as he was sure the rash was related to the former.  Last EF in July was 25 - 30%.    At the last visit I started BiDil.  He has not wanted to have an ICD.  Unfortunately he says he could not take the BiDil.  His blood pressure dropped too low.  He does not feel that he says when his blood pressure is below 140.  He said  he goes up and down spends a lot of time in the 120s 130s.  He really does not like try to push medicines any further.  He is doing a lot of stuff around the yard.  He has 5 mules.   The patient denies any new symptoms such as chest discomfort, neck or arm discomfort. There has been no new shortness of breath, PND or orthopnea. There have been no reported palpitations, presyncope or syncope.  He says he gets more fatigued than he used to.  He has some leg tiredness.   Past Medical History:  Diagnosis Date  . Anxiety   . Atrial fibrillation ([place])    Limited episode, emergency room, June, 2013, spontaneous conversion to sinus rhythm, was evaluated By [name] 2013- no further visits.  Post-op (CABG) a-fib, converted on amio but pt self d/c'd this med due to DOE/side effect profile.  [name] CAD (coronary artery disease) 05/2016   a. 05/2016: NSTEMI with cath showing 3-vessel disease. Underwent CABG on 05/12/16 with LIMA-D1, SVG-LAD, and SVG-PDA.   [name] COPD (chronic obstructive pulmonary disease) ([place]) fall 2016   Bullous changes noted on lower lung images of CT abd done by urology  . Diverticulitis   . GERD (gastroesophageal reflux disease)   . Has received pneumococcal vaccination   . Hyperlipidemia    statin intolerant  . Hypertension   . Ischemic cardiomyopathy 05/2016   EF 20-25% at the time of NSTEMI/CABG.  Repeat EF 07/2016 30-35%.  ACE-I and potassium stopped and  entresto and aldactone started 09/11/16>>then hyperkalemia so aldactone d/c'd.  07/2017 echo EF still 25-30%--Dr. Adessa Primiano to began discussion of ICD need and also started benazapril and d/c'd hctz in late July 2019. Pt decl ICD as of 06/2018 cards f/u  . Microscopic hematuria Fall 2016   CT nl except nonobstructing stones.  Cystoscopy normal 12/30/14 ([name])  . Nephrolithiasis    11 mm stone on R, 2 mm stone on L, ureters clear  . Non-STEMI (non-ST elevated myocardial infarction) ([place]) 05/2016   with impaired LV function  . Prostatic  adenocarcinoma ([place]) 02/2015   No evidence of metastatic dz on CT pelv 02/2015.  Urol ([name]) at [facility]; [name] referred him to [name] 03/2015: patient got bilat nerve sparing , robot assisted laparoscopic radical prostatectomy and pelvic lymphadenectomy.  Urol f/u, PSA undetectable on repeat 04/22/16 and 10/28/16; pt chose PSA surveill over adjuv rad tx.   . Tobacco dependence    down to 1-2 cigs per day as of 11/2015.  Restarted 08/2016.  [name] Urinary retention 05/2015   occurred s/p foley removal    Past Surgical History:  Procedure Laterality Date  . aortic ultrasound  07/2012   NO AAA  . COLONOSCOPY  approx 2011 per pt   Done by surgeon in [place] after a bout of diverticulitis; normal per pt report--was told to repeat in 10 yrs.  . CORONARY ARTERY BYPASS GRAFT N/A 05/12/2016   Procedure: CORONARY ARTERY BYPASS GRAFTING (CABG) TIMES THREE USING LEFT INTERNAL MAMMARY ARTERY AND RIGHT SAPHENOUS LEG VEIN HARVESTED ENDOSCOPICALLY;  Surgeon: [name], MD;  Location: [place];  Service: Open Heart Surgery;  Laterality: N/A;  . INGUINAL HERNIA REPAIR  2003 and 2004   Both sides.  [name] LAPAROSCOPY  2017  . LEFT HEART CATH AND CORONARY ANGIOGRAPHY N/A 05/09/2016   Procedure: Left Heart Cath and Coronary Angiography;  Surgeon: [name], MD;  Location: [place] [facility];  Service: Cardiovascular;  Laterality: N/A;  . LITHOTRIPSY  2004   Dr. Maryland Pink in [place].  [name] LYMPHADENECTOMY Bilateral 04/30/2015   Procedure: PELVIC LYMPHADENECTOMY;  Surgeon: [name], MD;  [facility];  Service: Urology;  Laterality: Bilateral;  . PROSTATE BIOPSY  02/05/15   Prostate adenocarcinoma: pt considering treatment options as of 02/19/15  . ROBOT ASSISTED LAPAROSCOPIC RADICAL PROSTATECTOMY N/A 04/30/2015   Procedure: XI ROBOTIC ASSISTED LAPAROSCOPIC RADICAL PROSTATECTOMY LEVEL 2;  Surgeon: [name], MD;  [facility];  Service: Urology;  Laterality: N/A;  . TEE WITHOUT CARDIOVERSION N/A  05/12/2016   Procedure: TRANSESOPHAGEAL ECHOCARDIOGRAM (TEE);  Surgeon: [name], MD;  Location: [place];  Service: Open Heart Surgery;  Laterality: N/A;  . TRANSTHORACIC ECHOCARDIOGRAM  03/22/2006; 05/2016; 07/2016; 07/2017   2008: EF 65%, normal valves, no wall motion abnormalties, LA size normal.  05/2016 in context of non-STEMI, EF 20-25%, mild AV and MV regurg.  08/05/16: EF 30-35%, akinesis of mid lateral myoc, grd II DD, mild aortic 07/2017: EF 25-30%, mult areas of akinesis, grd I DD.     Current Outpatient Medications  Medication Sig Dispense Refill  . ALPRAZolam (XANAX) 0.25 MG tablet TAKE ONE TABLET BY MOUTH AT BEDTIME AS NEEDED 30 tablet 5  . aspirin 81 MG tablet Take 1 tablet (81 mg total) by mouth daily.    . benazepril (LOTENSIN) 40 MG tablet Take 1 whole tablet by mouth twice daily 60 tablet 6  . carvedilol (COREG) 6.25 MG tablet Take 1 tablet (6.25 mg  total) by mouth 2 (two) times daily with a meal. 180 tablet 3  . furosemide (LASIX) 20 MG tablet Take 20mg  (1 tablet) as needed for swelling. 30 tablet 3  . loratadine (CLARITIN) 10 MG tablet Take 10 mg by mouth as needed for allergies.    . Omega-3 Fatty Acids (FISH OIL) 1000 MG CAPS Take 1 capsule by mouth daily.    [name] spironolactone (ALDACTONE) 25 MG tablet Take 1 tablet (25 mg total) by mouth daily. 90 tablet 3  . Vitamin D, Cholecalciferol, 400 units CAPS Take 400 Units by mouth daily.     . isosorbide-hydrALAZINE (BIDIL) 20-37.5 MG tablet Take 1 tablet by mouth 2 (two) times daily. (Patient not taking: Reported on 12/05/2018) 180 tablet 3   No current facility-administered medications for this visit.     Allergies:   Entresto [sacubitril-valsartan], Amlodipine, Contrast media [iodinated diagnostic agents], and Hydralazine    ROS:  Please see the history of present illness.   Otherwise, review of systems are positive for none.   All other systems are reviewed and negative.    PHYSICAL EXAM: VS:  BP (!) 180/80   Pulse  60   Ht 5\' 11"  (1.803 m)   Wt 163 lb (73.9 kg)   BMI 22.73 kg/m  , BMI Body mass index is 22.73 kg/m.  GENERAL:  Well appearing NECK:  No jugular venous distention, waveform within normal limits, carotid upstroke brisk and symmetric, no bruits, no thyromegaly LUNGS:  Clear to auscultation bilaterally CHEST:  Well healed sternotomy scar. HEART:  PMI not displaced or sustained,S1 and S2 within normal limits, no S3, no S4, no clicks, no rubs, no murmurs ABD:  Flat, positive bowel sounds normal in frequency in pitch, no bruits, no rebound, no guarding, no midline pulsatile mass, no hepatomegaly, no splenomegaly EXT:  2 plus pulses throughout, no edema, no cyanosis no clubbing   EKG:  EKG is not done today NA  Recent Labs: 01/26/2018: TSH 0.68 07/27/2018: ALT 14; Hemoglobin 15.3; Platelets 144.0 08/02/2018: BUN 14; Creatinine, Ser 1.06; Potassium 4.2; Sodium 139    Lipid Panel    Component Value Date/Time   CHOL 144 07/27/2018 1009   CHOL 137 09/16/2016 0858   TRIG 53.0 07/27/2018 1009   HDL 31.50 (L) 07/27/2018 1009   HDL 35 (L) 09/16/2016 0858   CHOLHDL 5 07/27/2018 1009   VLDL 10.6 07/27/2018 1009   LDLCALC 102 (H) 07/27/2018 1009   LDLCALC 88 09/16/2016 0858      Wt Readings from Last 3 Encounters:  12/05/18 163 lb (73.9 kg)  10/03/18 167 lb (75.8 kg)  08/30/18 167 lb 3.2 oz (75.8 kg)      Other studies Reviewed: Additional studies/ records that were reviewed today include: None Review of the above records demonstrates:  NA  ASSESSMENT AND PLAN:  CAD s/p CABG:   The patient has no new sypmtoms.  No further cardiovascular testing is indicated.  We will continue with aggressive risk reduction and meds as listed.  Ischemic cardiomyopathy:       EF remains low at 25 - 30%.  Unfortunately he really does not want med titration and does not feel well when his blood pressure is below 140 by his report.  He is not interested in taking the BiDil.  He is not interested in an  ICD at least till the pandemic is over.  He feels well.  At this point I will try to push medications .  He has been educated  and understands.   Hypertension:     As above he is elevated now but he says it is typically 140 or below at home and he does not feel well with it going lower.  He is not interested in taking more medications.  He understands the risk this has been exacerbating his heart failure symptoms and worsening prognosis.   Hyperlipidemia:    Again he is not interested in more medications.  He is very pleasant about all of this.    PAF:    He is not had any symptomatic recurrence.  No change in therapy.  OTHER: I suggested a flu shot but he declined.  Current medicines are reviewed at length with the patient today.  The patient does not have concerns regarding medicines.  The following changes have been made: None  Labs/ tests ordered today include:  None  No orders of the defined types were placed in this encounter.    Disposition:   FU with me in 6 months.      Signed, [name], MD  12/05/2018 9:51 AM    [place]

## 2018-12-05 ENCOUNTER — Encounter: Payer: Self-pay | Admitting: Cardiology

## 2018-12-05 ENCOUNTER — Other Ambulatory Visit: Payer: Self-pay

## 2018-12-05 ENCOUNTER — Ambulatory Visit (INDEPENDENT_AMBULATORY_CARE_PROVIDER_SITE_OTHER): Payer: Medicare Other | Admitting: Cardiology

## 2018-12-05 ENCOUNTER — Telehealth: Payer: Self-pay | Admitting: Cardiology

## 2018-12-05 VITALS — BP 180/80 | HR 60 | Ht 71.0 in | Wt 163.0 lb

## 2018-12-05 DIAGNOSIS — I2510 Atherosclerotic heart disease of native coronary artery without angina pectoris: Secondary | ICD-10-CM | POA: Diagnosis not present

## 2018-12-05 DIAGNOSIS — I5022 Chronic systolic (congestive) heart failure: Secondary | ICD-10-CM

## 2018-12-05 DIAGNOSIS — I480 Paroxysmal atrial fibrillation: Secondary | ICD-10-CM | POA: Diagnosis not present

## 2018-12-05 DIAGNOSIS — I10 Essential (primary) hypertension: Secondary | ICD-10-CM

## 2018-12-05 NOTE — Telephone Encounter (Signed)

## 2018-12-05 NOTE — Patient Instructions (Signed)
Medication Instructions:  The current medical regimen is effective;  continue present plan and medications.  *If you need a refill on your cardiac medications before your next appointment, please call your pharmacy*  Follow-Up: At [facility], you and your health needs are our priority.  As part of our continuing mission to provide you with exceptional heart care, we have created designated Provider Care Teams.  These Care Teams include your primary Cardiologist (physician) and Advanced Practice Providers (APPs -  Physician Assistants and Nurse Practitioners) who all work together to provide you with the care you need, when you need it.  Your next appointment:   6 months  The format for your next appointment:   In Person  Provider:   Dr [name] in [place]  Thank you for choosing [facility]!!

## 2018-12-21 ENCOUNTER — Other Ambulatory Visit: Payer: Self-pay | Admitting: Family Medicine

## 2018-12-21 NOTE — Telephone Encounter (Signed)
Requesting: xanax Contract: 01/26/2018 UDS: N/A Last Visit: 08/30/2018 Next Visit: N/A Last Refill: 06/05/2018  Please Advise

## 2018-12-24 IMAGING — CR DG CHEST 2V
2 series · 2 of 2 positions shown · non-contrast
Comparison: One-view chest x-ray 05/14/2016

CLINICAL DATA: Three days postoperative from CABG.

EXAM:
CHEST  2 VIEW

[chest pa]
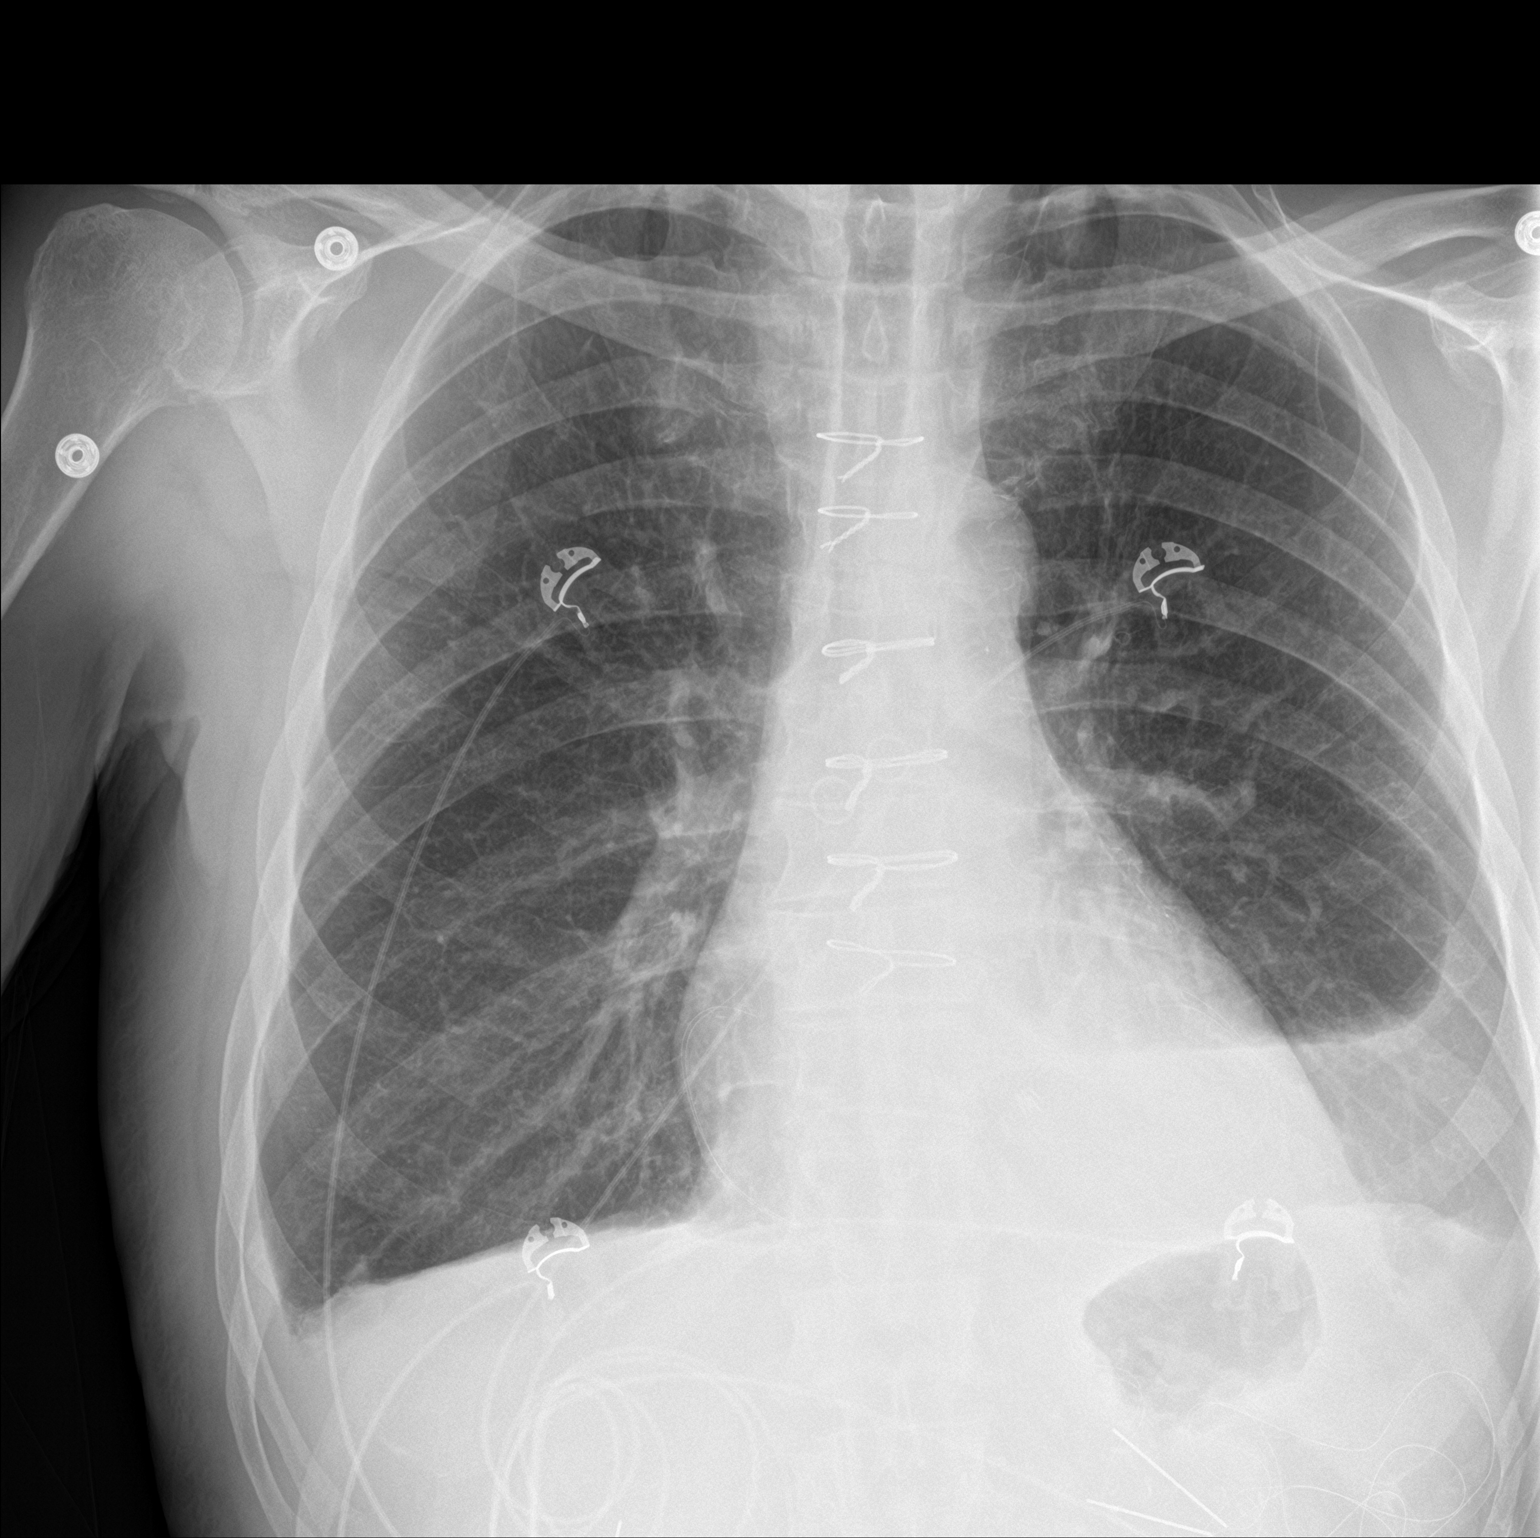

[chest lat]
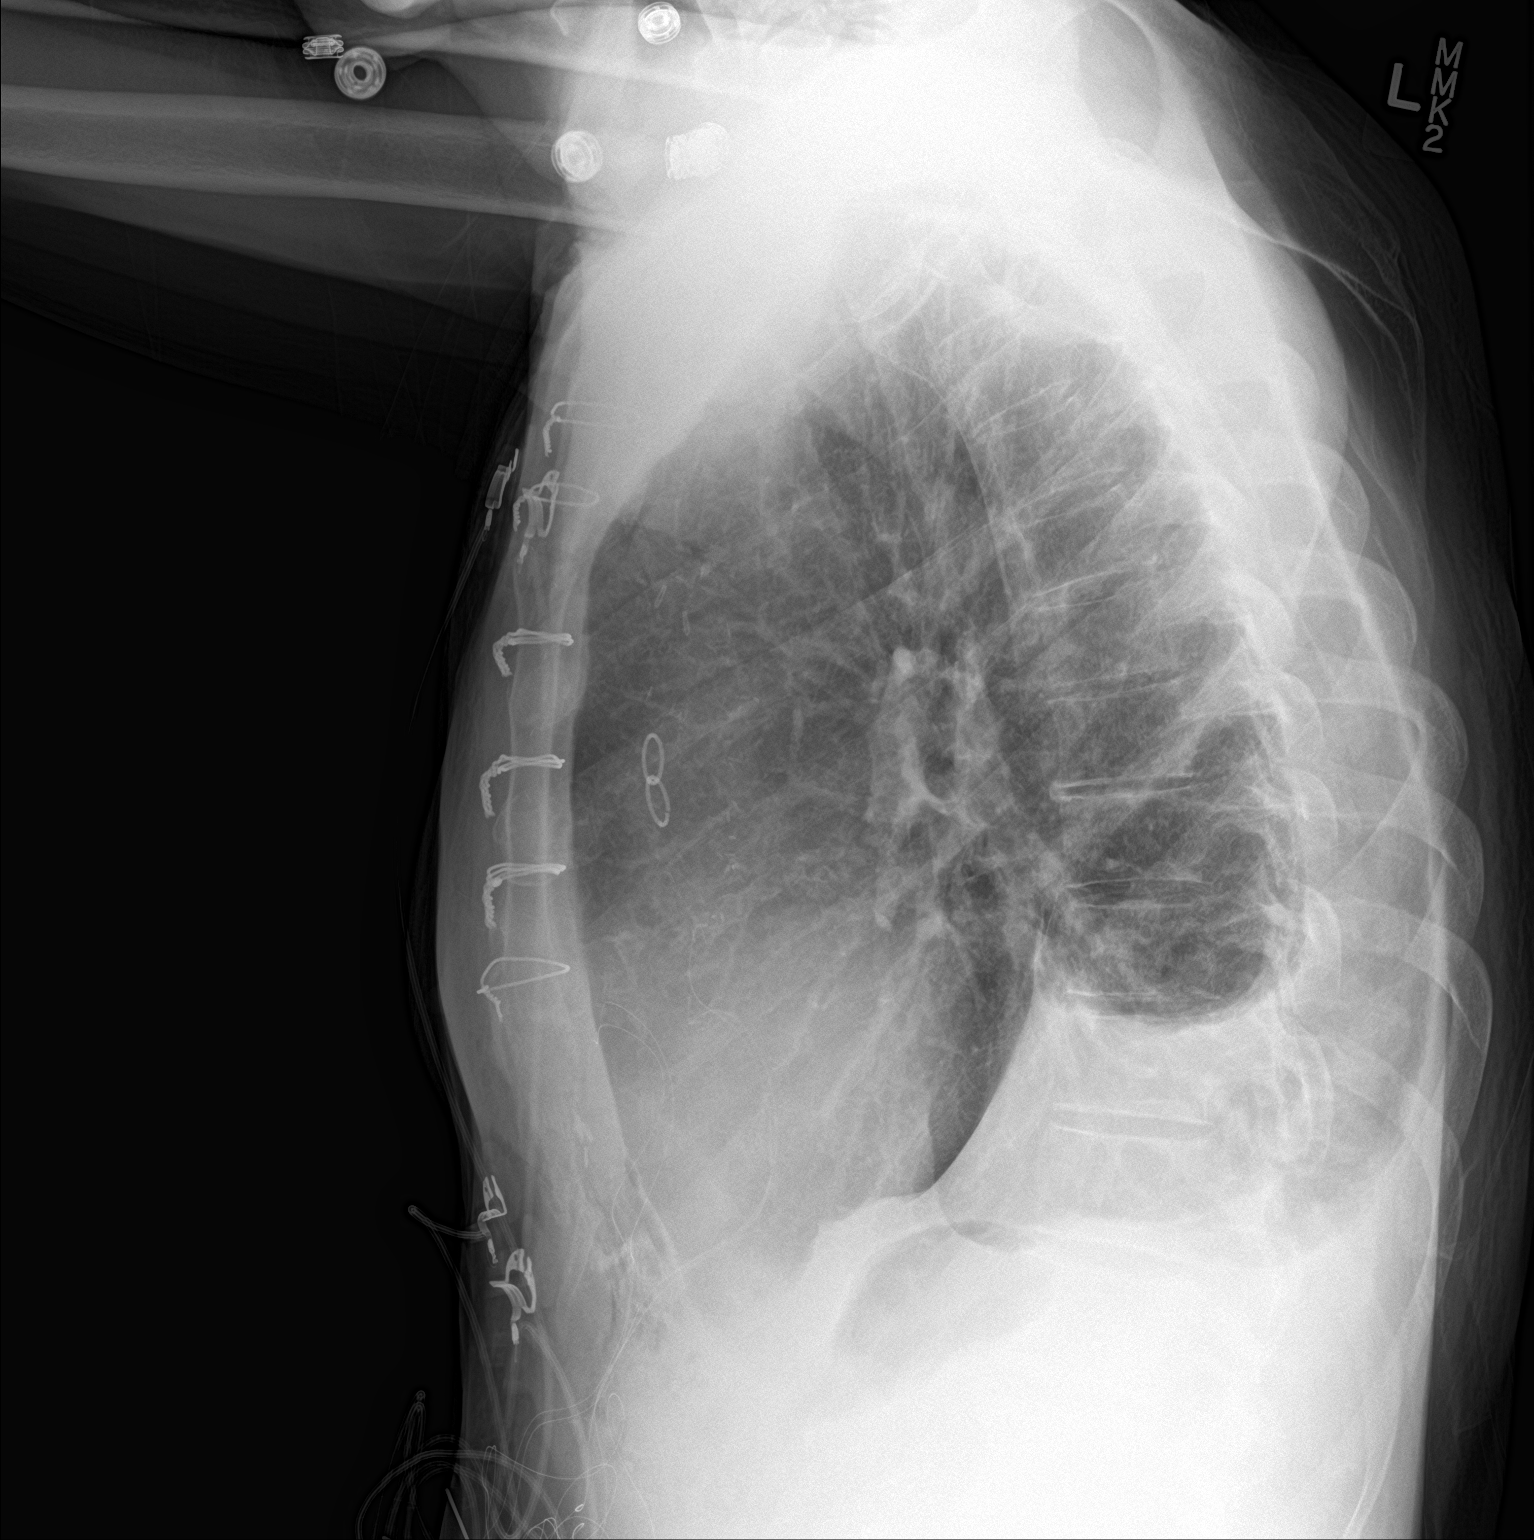

[2 of 2 positions shown; findings below may reference images not displayed]

FINDINGS: The right IJ sheath has been removed. The heart size is normal.
Bilateral pleural effusions are present, left greater than right.
Associated airspace disease likely reflects atelectasis. The upper
lung fields are clear. The visualized soft tissues and bony thorax
are unremarkable. Epicardial pacing wires remain.
IMPRESSION: 1. Left greater than right pleural effusions and associated
atelectasis.
2. The lungs are otherwise clear.

## 2019-02-05 ENCOUNTER — Telehealth: Payer: Self-pay | Admitting: Cardiology

## 2019-02-05 NOTE — Telephone Encounter (Signed)
New Message  STAT if HR is under 50 or over 120 (normal HR is 60-100 beats per minute)  1) What is your heart rate? 48  2) Do you have a log of your heart rate readings (document readings)? 48, 35, 38  3) Do you have any other symptoms? Palpatations, SOB, decreased HR,

## 2019-02-05 NOTE — Telephone Encounter (Signed)
Spoke with pt and has noted for about 2 weeks low HR and when this happens gets SOB Per pt HR the last couple of mornings  has been in the high 30's  Pt has not taken Carvedilol this am currently is taking 6.25 mg bid Will forward to Dr Percival Spanish for review and recommendations ./cy

## 2019-02-06 NOTE — Telephone Encounter (Signed)
OK for the patient to stop the Coreg

## 2019-02-08 NOTE — Telephone Encounter (Signed)
Lm to call back ./cy 

## 2019-02-08 NOTE — Telephone Encounter (Signed)
Pt aware of recommendations ./cy 

## 2019-02-18 NOTE — Progress Notes (Signed)
Cardiology Office Note   Date:  02/20/2019   ID:  Daniel [name]., DOB Apr 16, 1945, MRN [id number]  PCP:  [name], MD  Cardiologist:   [name], MD   Chief Complaint  Patient presents with  . Fatigue      History of Present Illness: Daniel [name]. is a 74 y.o. male who presents for follow up of CAD s/p CABG LIMA to D1, SVG to LAD, SVG to PDA on 05/12/2016, HLD intolerant to statin, HTN, ICM with baseline EF 30-35%, and h/o prostate CA.His last cardiac catheterization on 05/09/2016 showed EF 20-25%, akinesis of the distal inferoapical segment with significant hypokinesis globally consistent with ischemiccardiomyopathy, multivessel disease with 70% followed by total occlusion of large LAD with distal collateral arteries, 50% proximal left circumflex stenosis with total occlusion of distal left circumflex, total occlusion of mid RCA with distal collateral artery. Bypass surgery was recommended and he eventually underwent successful CABG 3 by Dr. Roxan Hockey on 05/12/2016 with LIMA to diagonal, SVG to LAD, SVG to PDA. He developed postoperative atrial fibrillation and was treated with IV amiodarone with conversion to sinus rhythm prior to discharge. He had a follow-up echocardiogram on 08/05/2016 which revealed LV EF remains low at 30-35% however slightly improved from his previous 20-25%.  He was placed on carvedilol and Entresto, spironolactone was discontinued due to elevated potassium level. Unfortunately, he went back to the hospital on 11/20/2016 with shortness breath, diffuse rash and presyncope. He was given prednisone for his rash. He stopped the [facility] and restarted ACE inhibitor as he was sure the rash was related to the former.  Last EF in July was 25 - 30%.  He has not wanted to have an ICD.  He could not take the BiDil.  His blood pressure dropped too low.  He had a low heart rate and we recently stopped Coreg.  He also stop spironolactone as he thought it was making  his heart skip murmur.  Unfortunately his blood pressure is fluctuating and it is very elevated today.  He notices it going up more at night.  He feels his heart skipping but since he stopped the Coreg he says his heart rate has gone up.  He has not had any presyncope or syncope.  He has had fatigue when he is trying to help his sinus with work.  He says he sleeps well.  He is not having any shortness of breath except with significant exertion.  He is not having any PND or orthopnea.  He has had no weight gain or edema.   Past Medical History:  Diagnosis Date  . Anxiety   . Atrial fibrillation ([place])    Limited episode, emergency room, June, 2013, spontaneous conversion to sinus rhythm, was evaluated By [name] 2013- no further visits.  Post-op (CABG) a-fib, converted on amio but pt self d/c'd this med due to DOE/side effect profile.  [name] CAD (coronary artery disease) 05/2016   a. 05/2016: NSTEMI with cath showing 3-vessel disease. Underwent CABG on 05/12/16 with LIMA-D1, SVG-LAD, and SVG-PDA.   [name] COPD (chronic obstructive pulmonary disease) ([place]) fall 2016   Bullous changes noted on lower lung images of CT abd done by urology  . Diverticulitis   . GERD (gastroesophageal reflux disease)   . Has received pneumococcal vaccination   . Hyperlipidemia    statin intolerant  . Hypertension   . Ischemic cardiomyopathy 05/2016   EF 20-25% at the time of NSTEMI/CABG.  Repeat  EF 07/2016 30-35%.  ACE-I and potassium stopped and entresto and aldactone started 09/11/16>>then hyperkalemia so aldactone d/c'd.  07/2017 echo EF still 25-30%--Dr. Ferry Matthis to began discussion of ICD need and also started benazapril and d/c'd hctz in late July 2019. Pt decl ICD as of 06/2018 cards f/u  . Microscopic hematuria Fall 2016   CT nl except nonobstructing stones.  Cystoscopy normal 12/30/14 ([name])  . Nephrolithiasis    11 mm stone on R, 2 mm stone on L, ureters clear  . Non-STEMI (non-ST elevated myocardial infarction)  ([place]) 05/2016   with impaired LV function  . 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  Urol ([name]) at [facility]; [name] referred him to [name] 03/2015: patient got bilat nerve sparing , robot assisted laparoscopic radical prostatectomy and pelvic lymphadenectomy.  Urol f/u, PSA undetectable on repeat 04/22/16 and 10/28/16; pt chose PSA surveill over adjuv rad tx.   . Tobacco dependence    down to 1-2 cigs per day as of 11/2015.  Restarted 08/2016.  [name] Urinary retention 05/2015   occurred s/p foley removal    Past Surgical History:  Procedure Laterality Date  . aortic ultrasound  07/2012   NO AAA  . COLONOSCOPY  approx 2011 per pt   Done by surgeon in [place] after a bout of diverticulitis; normal per pt report--was told to repeat in 10 yrs.  . CORONARY ARTERY BYPASS GRAFT N/A 05/12/2016   Procedure: CORONARY ARTERY BYPASS GRAFTING (CABG) TIMES THREE USING LEFT INTERNAL MAMMARY ARTERY AND RIGHT SAPHENOUS LEG VEIN HARVESTED ENDOSCOPICALLY;  Surgeon: [name], MD;  Location: [place];  Service: Open Heart Surgery;  Laterality: N/A;  . INGUINAL HERNIA REPAIR  2003 and 2004   Both sides.  [name] LAPAROSCOPY  2017  . LEFT HEART CATH AND CORONARY ANGIOGRAPHY N/A 05/09/2016   Procedure: Left Heart Cath and Coronary Angiography;  Surgeon: [name], MD;  Location: [place] [facility];  Service: Cardiovascular;  Laterality: N/A;  . LITHOTRIPSY  2004   Dr. [place] Pink in [place].  [name] LYMPHADENECTOMY Bilateral 04/30/2015   Procedure: PELVIC LYMPHADENECTOMY;  Surgeon: [name], MD;  Location: [facility];  Service: Urology;  Laterality: Bilateral;  . PROSTATE BIOPSY  02/05/15   Prostate adenocarcinoma: pt considering treatment options as of 02/19/15  . ROBOT ASSISTED LAPAROSCOPIC RADICAL PROSTATECTOMY N/A 04/30/2015   Procedure: XI ROBOTIC ASSISTED LAPAROSCOPIC RADICAL PROSTATECTOMY LEVEL 2;  Surgeon: [name], MD;  Location: [facility];  Service:  Urology;  Laterality: N/A;  . TEE WITHOUT CARDIOVERSION N/A 05/12/2016   Procedure: TRANSESOPHAGEAL ECHOCARDIOGRAM (TEE);  Surgeon: [name], MD;  Location: [place];  Service: Open Heart Surgery;  Laterality: N/A;  . TRANSTHORACIC ECHOCARDIOGRAM  03/22/2006; 05/2016; 07/2016; 07/2017   2008: EF 65%, normal valves, no wall motion abnormalties, LA size normal.  05/2016 in context of non-STEMI, EF 20-25%, mild AV and MV regurg.  08/05/16: EF 30-35%, akinesis of mid lateral myoc, grd II DD, mild aortic 07/2017: EF 25-30%, mult areas of akinesis, grd I DD.     Current Outpatient Medications  Medication Sig Dispense Refill  . ALPRAZolam (XANAX) 0.25 MG tablet TAKE ONE TABLET BY MOUTH AT BEDTIME AS NEEDED 30 tablet 5  . aspirin 81 MG tablet Take 1 tablet (81 mg total) by mouth daily.    . benazepril (LOTENSIN) 40 MG tablet Take 1 whole tablet by mouth twice daily 60 tablet 6  . furosemide (  LASIX) 20 MG tablet Take 20mg  (1 tablet) as needed for swelling. 30 tablet 3  . loratadine (CLARITIN) 10 MG tablet Take 10 mg by mouth as needed for allergies.    . Omega-3 Fatty Acids (FISH OIL) 1000 MG CAPS Take 1 capsule by mouth daily.    . Vitamin D, Cholecalciferol, 400 units CAPS Take 400 Units by mouth daily.     [name] spironolactone (ALDACTONE) 25 MG tablet Take 1 tablet (25 mg total) by mouth daily. 90 tablet 3   No current facility-administered medications for this visit.    Allergies:   Entresto [sacubitril-valsartan], Amlodipine, Contrast media [iodinated diagnostic agents], and Hydralazine    ROS:  Please see the history of present illness.   Otherwise, review of systems are positive for none.   All other systems are reviewed and negative.    PHYSICAL EXAM: VS:  BP (!) 190/100   Pulse (!) 52   Ht 5\' 11"  (1.803 m)   Wt 167 lb (75.8 kg)   BMI 23.29 kg/m  , BMI Body mass index is 23.29 kg/m.  GENERAL:  Well appearing NECK:  No jugular venous distention, waveform within normal limits, carotid  upstroke brisk and symmetric, no bruits, no thyromegaly LUNGS:  Clear to auscultation bilaterally CHEST:  Unremarkable HEART:  PMI not displaced or sustained,S1 and S2 within normal limits, no S3, no S4, no clicks, no rubs, no murmurs ABD:  Flat, positive bowel sounds normal in frequency in pitch, no bruits, no rebound, no guarding, no midline pulsatile mass, no hepatomegaly, no splenomegaly EXT:  2 plus pulses throughout, no edema, no cyanosis no clubbing   EKG:  EKG is not done today   Recent Labs: 07/27/2018: ALT 14; Hemoglobin 15.3; Platelets 144.0 08/02/2018: BUN 14; Creatinine, Ser 1.06; Potassium 4.2; Sodium 139    Lipid Panel    Component Value Date/Time   CHOL 144 07/27/2018 1009   CHOL 137 09/16/2016 0858   TRIG 53.0 07/27/2018 1009   HDL 31.50 (L) 07/27/2018 1009   HDL 35 (L) 09/16/2016 0858   CHOLHDL 5 07/27/2018 1009   VLDL 10.6 07/27/2018 1009   LDLCALC 102 (H) 07/27/2018 1009   LDLCALC 88 09/16/2016 0858      Wt Readings from Last 3 Encounters:  02/20/19 167 lb (75.8 kg)  12/05/18 163 lb (73.9 kg)  10/03/18 167 lb (75.8 kg)      Other studies Reviewed: Additional studies/ records that were reviewed today include: None Review of the above records demonstrates:  NA  ASSESSMENT AND PLAN:  CAD s/p CABG:     He is having no angina.  No change in therapy.  Ischemic cardiomyopathy:       EF remains low at 25 - 30%.  He has not tolerated med titration.  It has been very difficult to manage him because of his side effects to medications.  He is reluctant to take a lot of medicines.  He does agree to take his lisinopril.  He agrees to restart spironolactone and I will check blood work to follow-up.  He will keep a blood pressure check.  He finally agrees to consider an ICD I think at the encouraging of his family.  I will set up an EP consult.  Hypertension:    His blood pressure fluctuates.  He agrees to start the spironolactone and I will try to slowly modify  medications as he allows based on future blood pressure readings.  Hyperlipidemia:   He is not interested in statin.  Fatigue: I will check a TSH and CBC when he gets his blood work next.  PAF:    He has not had any symptomatic recurrence.  No change in therapy.  COVID EDUCATION: We talked about the vaccine but he does not think he would get it.  Current medicines are reviewed at length with the patient today.  The patient does not have concerns regarding medicines.  The following changes have been made:   Labs/ tests ordered today include:    Orders Placed This Encounter  Procedures  . CBC  . Basic metabolic panel  . TSH  . Ambulatory referral to Cardiac Electrophysiology     Disposition:   FU with me in six weeks.   Signed, [name], MD  02/20/2019 10:20 AM    [facility]

## 2019-02-20 ENCOUNTER — Ambulatory Visit: Payer: Medicare Other | Admitting: Cardiology

## 2019-02-20 ENCOUNTER — Encounter: Payer: Self-pay | Admitting: Cardiology

## 2019-02-20 VITALS — BP 190/100 | HR 52 | Ht 71.0 in | Wt 167.0 lb

## 2019-02-20 DIAGNOSIS — I2510 Atherosclerotic heart disease of native coronary artery without angina pectoris: Secondary | ICD-10-CM

## 2019-02-20 DIAGNOSIS — Z7189 Other specified counseling: Secondary | ICD-10-CM | POA: Diagnosis not present

## 2019-02-20 DIAGNOSIS — I255 Ischemic cardiomyopathy: Secondary | ICD-10-CM | POA: Diagnosis not present

## 2019-02-20 DIAGNOSIS — E785 Hyperlipidemia, unspecified: Secondary | ICD-10-CM | POA: Diagnosis not present

## 2019-02-20 DIAGNOSIS — Z951 Presence of aortocoronary bypass graft: Secondary | ICD-10-CM

## 2019-02-20 DIAGNOSIS — Z79899 Other long term (current) drug therapy: Secondary | ICD-10-CM

## 2019-02-20 DIAGNOSIS — I10 Essential (primary) hypertension: Secondary | ICD-10-CM

## 2019-02-20 MED ORDER — SPIRONOLACTONE 25 MG PO TABS: 25.0000 mg | ORAL_TABLET | Freq: Every day | ORAL | 3 refills | Status: DC

## 2019-02-20 NOTE — Patient Instructions (Signed)
Medication Instructions:  Please restart Spironolactone.  Continue all other medications as listed.  *If you need a refill on your cardiac medications before your next appointment, please call your pharmacy*  Lab Work: Please have blood work in 1 week at Dr [facility] office.  (CBC, BMP, TSH)  If you have labs (blood work) drawn today and your tests are completely normal, you will receive your results only by: [name] MyChart Message (if you have MyChart) OR . A paper copy in the mail If you have any lab test that is abnormal or we need to change your treatment, we will call you to review the results.  Follow-Up: At [facility], you and your health needs are our priority.  As part of our continuing mission to provide you with exceptional heart care, we have created designated Provider Care Teams.  These Care Teams include your primary Cardiologist (physician) and Advanced Practice Providers (APPs -  Physician Assistants and Nurse Practitioners) who all work together to provide you with the care you need, when you need it.  Your next appointment:   6 week(s)  The format for your next appointment:   In Person  Provider:   [name], MD  Thank you for choosing [facility]!!   You have been referred to Electrophysiology to discuss possible ICD placement.   Cardioverter Defibrillator Implantation  An implantable cardioverter defibrillator (ICD) is a small device that is placed under the skin in the chest or abdomen. An ICD consists of a battery, a small computer (pulse generator), and wires (leads) that go into the heart. An ICD is used to detect and correct two types of dangerous irregular heartbeats (arrhythmias):  A rapid heart rhythm (tachycardia).  An arrhythmia in which the lower chambers of the heart (ventricles) contract in an uncoordinated way (fibrillation). When an ICD detects tachycardia, it sends a low-energy shock to the heart to restore the heartbeat to  normal (cardioversion). This signal is usually painless. If cardioversion does not work or if the ICD detects fibrillation, it delivers a high-energy shock to the heart (defibrillation) to restart the heart. This shock may feel like a strong jolt in the chest. Your health care provider may prescribe an ICD if:  You have had an arrhythmia that originated in the ventricles.  Your heart has been damaged by a disease or heart condition. Sometimes, ICDs are programmed to act as a device called a pacemaker. Pacemakers can be used to treat a slow heartbeat (bradycardia) or tachycardia by taking over the heart rate with electrical impulses. Tell a health care provider about:  Any allergies you have.  All medicines you are taking, including vitamins, herbs, eye drops, creams, and over-the-counter medicines.  Any problems you or family members have had with anesthetic medicines.  Any blood disorders you have.  Any surgeries you have had.  Any medical conditions you have.  Whether you are pregnant or may be pregnant. What are the risks? Generally, this is a safe procedure. However, problems may occur, including:  Swelling, bleeding, or bruising.  Infection.  Blood clots.  Damage to other structures or organs, such as nerves, blood vessels, or the heart.  Allergic reactions to medicines used during the procedure. What happens before the procedure? Staying hydrated Follow instructions from your health care provider about hydration, which may include:  Up to 2 hours before the procedure - you may continue to drink clear liquids, such as water, clear fruit juice, black coffee, and plain tea. Eating and  drinking restrictions Follow instructions from your health care provider about eating and drinking, which may include:  8 hours before the procedure - stop eating heavy meals or foods such as meat, fried foods, or fatty foods.  6 hours before the procedure - stop eating light meals or  foods, such as toast or cereal.  6 hours before the procedure - stop drinking milk or drinks that contain milk.  2 hours before the procedure - stop drinking clear liquids. Medicine Ask your health care provider about:  Changing or stopping your normal medicines. This is important if you take diabetes medicines or blood thinners.  Taking medicines such as aspirin and ibuprofen. These medicines can thin your blood. Do not take these medicines before your procedure if your doctor tells you not to. Tests  You may have blood tests.  You may have a test to check the electrical signals in your heart (electrocardiogram, ECG).  You may have imaging tests, such as a chest X-ray. General instructions  For 24 hours before the procedure, stop using products that contain nicotine or tobacco, such as cigarettes and e-cigarettes. If you need help quitting, ask your health care provider.  Plan to have someone take you home from the hospital or clinic.  You may be asked to shower with a germ-killing soap. What happens during the procedure?  To reduce your risk of infection: ? Your health care team will wash or sanitize their hands. ? Your skin will be washed with soap. ? Hair may be removed from the surgical area.  Small monitors will be put on your body. They will be used to check your heart, blood pressure, and oxygen level.  An IV tube will be inserted into one of your veins.  You will be given one or more of the following: ? A medicine to help you relax (sedative). ? A medicine to numb the area (local anesthetic). ? A medicine to make you fall asleep (general anesthetic).  Leads will be guided through a blood vessel into your heart and attached to your heart muscles. Depending on the ICD, the leads may go into one ventricle or they may go into both ventricles and into an upper chamber of the heart. An X-ray machine (fluoroscope) will be usedto help guide the leads.  A small incision  will be made to create a deep pocket under your skin.  The pulse generator will be placed into the pocket.  The ICD will be tested.  The incision will be closed with stitches (sutures), skin glue, or staples.  A bandage (dressing) will be placed over the incision. This procedure may vary among health care providers and hospitals. What happens after the procedure?  Your blood pressure, heart rate, breathing rate, and blood oxygen level will be monitored often until the medicines you were given have worn off.  A chest X-ray will be taken to check that the ICD is in the right place.  You will need to stay in the hospital for 1-2 days so your health care provider can make sure your ICD is working.  Do not drive for 24 hours if you received a sedative. Ask your health care provider when it is safe for you to drive.  You may be given an identification card explaining that you have an ICD. Summary  An implantable cardioverter defibrillator (ICD) is a small device that is placed under the skin in the chest or abdomen. It is used to detect and correct dangerous irregular heartbeats (  arrhythmias).  An ICD consists of a battery, a small computer (pulse generator), and wires (leads) that go into the heart.  When an ICD detects rapid heart rhythm (tachycardia), it sends a low-energy shock to the heart to restore the heartbeat to normal (cardioversion). If cardioversion does not work or if the ICD detects uncoordinated heart contractions (fibrillation), it delivers a high-energy shock to the heart (defibrillation) to restart the heart.  You will need to stay in the hospital for 1-2 days to make sure your ICD is working. This information is not intended to replace advice given to you by your health care provider. Make sure you discuss any questions you have with your health care provider. Document Revised: 12/30/2016 Document Reviewed: 01/27/2016 Elsevier Patient Education  2020 [employer].

## 2019-02-21 ENCOUNTER — Telehealth: Payer: Self-pay | Admitting: Family Medicine

## 2019-02-21 NOTE — Telephone Encounter (Signed)
Requested to speak to [facility] regarding his upcoming lab appt - he needs to schedule after speaking with Lattie Haw  Please call patient at [phone]

## 2019-02-21 NOTE — Telephone Encounter (Signed)
Patient requesting cardiology labs be performed here, I am not authorized to draw labs that are not ordered by [employer].  Patient's wife voiced understanding.

## 2019-02-26 ENCOUNTER — Encounter: Payer: Self-pay | Admitting: Family Medicine

## 2019-02-26 ENCOUNTER — Other Ambulatory Visit: Payer: Self-pay | Admitting: Cardiology

## 2019-02-26 DIAGNOSIS — I255 Ischemic cardiomyopathy: Secondary | ICD-10-CM | POA: Diagnosis not present

## 2019-02-26 DIAGNOSIS — I2510 Atherosclerotic heart disease of native coronary artery without angina pectoris: Secondary | ICD-10-CM | POA: Diagnosis not present

## 2019-02-26 DIAGNOSIS — I10 Essential (primary) hypertension: Secondary | ICD-10-CM | POA: Diagnosis not present

## 2019-02-26 DIAGNOSIS — Z79899 Other long term (current) drug therapy: Secondary | ICD-10-CM | POA: Diagnosis not present

## 2019-02-27 LAB — BASIC METABOLIC PANEL
BUN/Creatinine Ratio: 15 (ref 10–24)
BUN: 15 mg/dL (ref 8–27)
CO2: 23 mmol/L (ref 20–29)
Calcium: 9.8 mg/dL (ref 8.6–10.2)
Chloride: 102 mmol/L (ref 96–106)
Creatinine, Ser: 0.97 mg/dL (ref 0.76–1.27)
GFR calc Af Amer: 89 mL/min/{1.73_m2} (ref >59)
GFR calc non Af Amer: 77 mL/min/{1.73_m2} (ref >59)
Glucose: 81 mg/dL (ref 65–99)
Potassium: 4.8 mmol/L (ref 3.5–5.2)
Sodium: 139 mmol/L (ref 134–144)

## 2019-02-27 LAB — CBC
Hematocrit: 46.4 % (ref 37.5–51.0)
Hemoglobin: 16.4 g/dL (ref 13.0–17.7)
MCH: 32.7 pg (ref 26.6–33.0)
MCHC: 35.3 g/dL (ref 31.5–35.7)
MCV: 93 fL (ref 79–97)
Platelets: 137 10*3/uL — ABNORMAL LOW (ref 150–450)
RBC: 5.01 x10E6/uL (ref 4.14–5.80)
RDW: 12.6 % (ref 11.6–15.4)
WBC: 4.9 10*3/uL (ref 3.4–10.8)

## 2019-02-27 LAB — TSH: TSH: 0.98 u[IU]/mL (ref 0.450–4.500)

## 2019-03-15 ENCOUNTER — Encounter: Payer: Self-pay | Admitting: Internal Medicine

## 2019-03-15 ENCOUNTER — Ambulatory Visit: Payer: Medicare Other | Admitting: Internal Medicine

## 2019-03-15 ENCOUNTER — Other Ambulatory Visit: Payer: Self-pay

## 2019-03-15 VITALS — BP 128/76 | HR 60 | Ht 71.0 in | Wt 166.2 lb

## 2019-03-15 DIAGNOSIS — I255 Ischemic cardiomyopathy: Secondary | ICD-10-CM

## 2019-03-15 NOTE — Progress Notes (Signed)
HPI [name] is referred today by Dr. [facility] to consider ICD insertion. He is a pleasant 74 yo man with a h/o CAD ,s/p MI, s/p CABG, EF 30%, HTN and dyslipidemia. The patient had post op atrial fib. He has had class 2 symptoms. He remain active but has to stop and rest if he exerts himself too hard. He denies syncope. No angina. He was intolerant to bidil, entresto and several beta blockers which led to bradycardia.  He denies peripheral edema.  Allergies  Allergen Reactions  . Entresto [Sacubitril-Valsartan] Hives and Shortness Of Breath  . Amlodipine Swelling    LE swelling  . Contrast Media [Iodinated Diagnostic Agents] Other (See Comments)    Prostate problem had to wear a catheter for two weeks after having dye.   [name] Hydralazine Palpitations and Other (See Comments)    Fatigue+     Current Outpatient Medications  Medication Sig Dispense Refill  . ALPRAZolam (XANAX) 0.25 MG tablet TAKE ONE TABLET BY MOUTH AT BEDTIME AS NEEDED 30 tablet 5  . aspirin 81 MG tablet Take 1 tablet (81 mg total) by mouth daily.    . benazepril (LOTENSIN) 40 MG tablet Take 1 whole tablet by mouth twice daily 60 tablet 6  . furosemide (LASIX) 20 MG tablet Take 20mg  (1 tablet) as needed for swelling. 30 tablet 3  . loratadine (CLARITIN) 10 MG tablet Take 10 mg by mouth as needed for allergies.    . Omega-3 Fatty Acids (FISH OIL) 1000 MG CAPS Take 1 capsule by mouth daily.    [name] spironolactone (ALDACTONE) 25 MG tablet Take 1 tablet (25 mg total) by mouth daily. 90 tablet 3  . Vitamin D, Cholecalciferol, 400 units CAPS Take 400 Units by mouth daily.      No current facility-administered medications for this visit.     Past Medical History:  Diagnosis Date  . Anxiety   . Atrial fibrillation ([place])    Limited episode, emergency room, June, 2013, spontaneous conversion to sinus rhythm, was evaluated By [name] 2013- no further visits.  Post-op (CABG) a-fib, converted on amio but pt self d/c'd this med due  to DOE/side effect profile.  [name] CAD (coronary artery disease) 05/2016   a. 05/2016: NSTEMI with cath showing 3-vessel disease. Underwent CABG on 05/12/16 with LIMA-D1, SVG-LAD, and SVG-PDA.   [name] COPD (chronic obstructive pulmonary disease) ([place]) fall 2016   Bullous changes noted on lower lung images of CT abd done by urology  . Diverticulitis   . GERD (gastroesophageal reflux disease)   . Has received pneumococcal vaccination   . Hyperlipidemia    statin intolerant  . Hypertension   . Ischemic cardiomyopathy 05/2016   EF 20-25% at the time of NSTEMI/CABG.  Repeat EF 07/2016 30-35%.  ACE-I and potassium stopped and entresto and aldactone started 09/11/16>>then hyperkalemia so aldactone d/c'd.  07/2017 echo EF still 25-30%--Dr. Hochrein to began discussion of ICD need and also started benazapril and d/c'd hctz in late July 2019. Pt decl ICD as of 06/2018 cards f/u. Aldact + ACE-I 02/2019, pt ok w/ICD now.  . Microscopic hematuria Fall 2016   CT nl except nonobstructing stones.  Cystoscopy normal 12/30/14 ([name])  . Nephrolithiasis    11 mm stone on R, 2 mm stone on L, ureters clear  . Non-STEMI (non-ST elevated myocardial infarction) ([place]) 05/2016   with impaired LV function  . 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  Urol ([name]) at [facility]; [name] referred him to [name] 03/2015: patient got bilat nerve sparing , robot assisted laparoscopic radical prostatectomy and pelvic lymphadenectomy.  Urol f/u, PSA undetectable on repeat 04/22/16 and 10/28/16; pt chose PSA surveill over adjuv rad tx.   . Tobacco dependence    down to 1-2 cigs per day as of 11/2015.  Restarted 08/2016.  [name] Urinary retention 05/2015   occurred s/p foley removal    ROS:   All systems reviewed and negative except as noted in the HPI.   Past Surgical History:  Procedure Laterality Date  . aortic ultrasound  07/2012   NO AAA  . COLONOSCOPY  approx 2011 per pt    Done by surgeon in [place] after a bout of diverticulitis; normal per pt report--was told to repeat in 10 yrs.  . CORONARY ARTERY BYPASS GRAFT N/A 05/12/2016   Procedure: CORONARY ARTERY BYPASS GRAFTING (CABG) TIMES THREE USING LEFT INTERNAL MAMMARY ARTERY AND RIGHT SAPHENOUS LEG VEIN HARVESTED ENDOSCOPICALLY;  Surgeon: [name], MD;  Location: [place];  Service: Open Heart Surgery;  Laterality: N/A;  . INGUINAL HERNIA REPAIR  2003 and 2004   Both sides.  [name] LAPAROSCOPY  2017  . LEFT HEART CATH AND CORONARY ANGIOGRAPHY N/A 05/09/2016   Procedure: Left Heart Cath and Coronary Angiography;  Surgeon: [name], MD;  Location: [place] [facility];  Service: Cardiovascular;  Laterality: N/A;  . LITHOTRIPSY  2004   Dr. Maryland Pink in [place].  [name] LYMPHADENECTOMY Bilateral 04/30/2015   Procedure: PELVIC LYMPHADENECTOMY;  Surgeon: [name], MD;  Location: [facility];  Service: Urology;  Laterality: Bilateral;  . PROSTATE BIOPSY  02/05/15   Prostate adenocarcinoma: pt considering treatment options as of 02/19/15  . ROBOT ASSISTED LAPAROSCOPIC RADICAL PROSTATECTOMY N/A 04/30/2015   Procedure: XI ROBOTIC ASSISTED LAPAROSCOPIC RADICAL PROSTATECTOMY LEVEL 2;  Surgeon: [name], MD;  Location: [facility];  Service: Urology;  Laterality: N/A;  . TEE WITHOUT CARDIOVERSION N/A 05/12/2016   Procedure: TRANSESOPHAGEAL ECHOCARDIOGRAM (TEE);  Surgeon: [name], MD;  Location: [place];  Service: Open Heart Surgery;  Laterality: N/A;  . TRANSTHORACIC ECHOCARDIOGRAM  03/22/2006; 05/2016; 07/2016; 07/2017   2008: EF 65%, normal valves, no wall motion abnormalties, LA size normal.  05/2016 in context of non-STEMI, EF 20-25%, mild AV and MV regurg.  08/05/16: EF 30-35%, akinesis of mid lateral myoc, grd II DD, mild aortic 07/2017: EF 25-30%, mult areas of akinesis, grd I DD.     Family History  Problem Relation Age of Onset  . Hypertension Mother   . Cancer - Other Mother 9       breast  . Cancer Father        Lung  ca age 38  . Diabetes Sister      Social History   Socioeconomic History  . Marital status: Married    Spouse name: Not on file  . Number of children: Not on file  . Years of education: Not on file  . Highest education level: Not on file  Occupational History  . Not on file  Tobacco Use  . Smoking status: Former Smoker    Packs/day: 1.00    Years: 55.00    Pack years: 55.00    Types: Cigarettes  . Smokeless tobacco: Never Used  . Tobacco comment: STOPPED AT SURGERY  Substance and Sexual Activity  . Alcohol use: No  . Drug use: No  . Sexual activity: Not on  file  Other Topics Concern  . Not on file  Social History Narrative   Married, 2 grown children.   HS education.  Orig from [place], lives there now.   Occupation: maintenance.  Drives a tractor a lot, drives a pickup truck a lot, rides horses a lot.   Tobacco: 20 pack-yr hx (current as of 07/2012)   No drugs.   Alcohol: none   Social Determinants of Health   Financial Resource Strain:   . Difficulty of Paying Living Expenses: Not on file  Food Insecurity:   . Worried About Charity fundraiser in the Last Year: Not on file  . Ran Out of Food in the Last Year: Not on file  Transportation Needs:   . Lack of Transportation (Medical): Not on file  . Lack of Transportation (Non-Medical): Not on file  Physical Activity:   . Days of Exercise per Week: Not on file  . Minutes of Exercise per Session: Not on file  Stress:   . Feeling of Stress : Not on file  Social Connections:   . Frequency of Communication with Friends and Family: Not on file  . Frequency of Social Gatherings with Friends and Family: Not on file  . Attends Religious Services: Not on file  . Active Member of Clubs or Organizations: Not on file  . Attends Archivist Meetings: Not on file  . Marital Status: Not on file  Intimate Partner Violence:   . Fear of Current or Ex-Partner: Not on file  . Emotionally Abused: Not on file  .  Physically Abused: Not on file  . Sexually Abused: Not on file     BP 128/76   Pulse 60   Ht 5\' 11"  (1.803 m)   Wt 166 lb 3.2 oz (75.4 kg)   SpO2 97%   BMI 23.18 kg/m   Physical Exam:  Well appearing NAD HEENT: Unremarkable Neck:  No JVD, no thyromegally Lymphatics:  No adenopathy Back:  No CVA tenderness Lungs:  Clear with no wheezes HEART:  Regular rate rhythm, no murmurs, no rubs, no clicks Abd:  soft, positive bowel sounds, no organomegally, no rebound, no guarding Ext:  2 plus pulses, no edema, no cyanosis, no clubbing Skin:  No rashes no nodules Neuro:  CN II through XII intact, motor grossly intact  EKG - nsr with PVC's, QRS 120 ms  Assess/Plan: 1. ICM - he has class 2 symptoms and an EF of 30%. I have discussed the treatment options with the patient and the risks/benefits/goals/expectations of ICD insertion were reviewed and he wishes to proceed.  2. Chronic systolic heart failure - his symptoms are class 2. He will continue his current meds. 3. Dyslipidemia - he is statin intolerant. I will defer decisions about Repatha to Dr. Lenore Cordia.   Mikle Bosworth.D.

## 2019-03-15 NOTE — H&P (View-Only) (Signed)
HPI [name] is referred today by Dr. [facility] to consider ICD insertion. He is a pleasant 74 yo man with a h/o CAD ,s/p MI, s/p CABG, EF 30%, HTN and dyslipidemia. The patient had post op atrial fib. He has had class 2 symptoms. He remain active but has to stop and rest if he exerts himself too hard. He denies syncope. No angina. He was intolerant to bidil, entresto and several beta blockers which led to bradycardia.  He denies peripheral edema.  Allergies  Allergen Reactions  . Entresto [Sacubitril-Valsartan] Hives and Shortness Of Breath  . Amlodipine Swelling    LE swelling  . Contrast Media [Iodinated Diagnostic Agents] Other (See Comments)    Prostate problem had to wear a catheter for two weeks after having dye.   [name] Hydralazine Palpitations and Other (See Comments)    Fatigue+     Current Outpatient Medications  Medication Sig Dispense Refill  . ALPRAZolam (XANAX) 0.25 MG tablet TAKE ONE TABLET BY MOUTH AT BEDTIME AS NEEDED 30 tablet 5  . aspirin 81 MG tablet Take 1 tablet (81 mg total) by mouth daily.    . benazepril (LOTENSIN) 40 MG tablet Take 1 whole tablet by mouth twice daily 60 tablet 6  . furosemide (LASIX) 20 MG tablet Take 20mg  (1 tablet) as needed for swelling. 30 tablet 3  . loratadine (CLARITIN) 10 MG tablet Take 10 mg by mouth as needed for allergies.    . Omega-3 Fatty Acids (FISH OIL) 1000 MG CAPS Take 1 capsule by mouth daily.    [name] spironolactone (ALDACTONE) 25 MG tablet Take 1 tablet (25 mg total) by mouth daily. 90 tablet 3  . Vitamin D, Cholecalciferol, 400 units CAPS Take 400 Units by mouth daily.      No current facility-administered medications for this visit.     Past Medical History:  Diagnosis Date  . Anxiety   . Atrial fibrillation ([place])    Limited episode, emergency room, June, 2013, spontaneous conversion to sinus rhythm, was evaluated By [name] 2013- no further visits.  Post-op (CABG) a-fib, converted on amio but pt self d/c'd this med due  to DOE/side effect profile.  [name] CAD (coronary artery disease) 05/2016   a. 05/2016: NSTEMI with cath showing 3-vessel disease. Underwent CABG on 05/12/16 with LIMA-D1, SVG-LAD, and SVG-PDA.   [name] COPD (chronic obstructive pulmonary disease) ([place]) fall 2016   Bullous changes noted on lower lung images of CT abd done by urology  . Diverticulitis   . GERD (gastroesophageal reflux disease)   . Has received pneumococcal vaccination   . Hyperlipidemia    statin intolerant  . Hypertension   . Ischemic cardiomyopathy 05/2016   EF 20-25% at the time of NSTEMI/CABG.  Repeat EF 07/2016 30-35%.  ACE-I and potassium stopped and entresto and aldactone started 09/11/16>>then hyperkalemia so aldactone d/c'd.  07/2017 echo EF still 25-30%--Dr. Hochrein to began discussion of ICD need and also started benazapril and d/c'd hctz in late July 2019. Pt decl ICD as of 06/2018 cards f/u. Aldact + ACE-I 02/2019, pt ok w/ICD now.  . Microscopic hematuria Fall 2016   CT nl except nonobstructing stones.  Cystoscopy normal 12/30/14 ([name])  . Nephrolithiasis    11 mm stone on R, 2 mm stone on L, ureters clear  . Non-STEMI (non-ST elevated myocardial infarction) ([place]) 05/2016   with impaired LV function  . Prostatic adenocarcinoma ([place]) 02/2015   No evidence of metastatic dz on CT  pelv 02/2015.  Urol ([name]) at [facility]; [name] referred him to [name] 03/2015: patient got bilat nerve sparing , robot assisted laparoscopic radical prostatectomy and pelvic lymphadenectomy.  Urol f/u, PSA undetectable on repeat 04/22/16 and 10/28/16; pt chose PSA surveill over adjuv rad tx.   . Tobacco dependence    down to 1-2 cigs per day as of 11/2015.  Restarted 08/2016.  [name] Urinary retention 05/2015   occurred s/p foley removal    ROS:   All systems reviewed and negative except as noted in the HPI.   Past Surgical History:  Procedure Laterality Date  . aortic ultrasound  07/2012   NO AAA  . COLONOSCOPY  approx 2011 per pt    Done by surgeon in [place] after a bout of diverticulitis; normal per pt report--was told to repeat in 10 yrs.  . CORONARY ARTERY BYPASS GRAFT N/A 05/12/2016   Procedure: CORONARY ARTERY BYPASS GRAFTING (CABG) TIMES THREE USING LEFT INTERNAL MAMMARY ARTERY AND RIGHT SAPHENOUS LEG VEIN HARVESTED ENDOSCOPICALLY;  Surgeon: [name], MD;  Location: [facility];  Service: Open Heart Surgery;  Laterality: N/A;  . INGUINAL HERNIA REPAIR  2003 and 2004   Both sides.  [name] LAPAROSCOPY  2017  . LEFT HEART CATH AND CORONARY ANGIOGRAPHY N/A 05/09/2016   Procedure: Left Heart Cath and Coronary Angiography;  Surgeon: [name], MD;  Location: [facility];  Service: Cardiovascular;  Laterality: N/A;  . LITHOTRIPSY  2004   Dr. Maryland Pink in [place].  [name] LYMPHADENECTOMY Bilateral 04/30/2015   Procedure: PELVIC LYMPHADENECTOMY;  Surgeon: [name], MD;  [facility];  Service: Urology;  Laterality: Bilateral;  . PROSTATE BIOPSY  02/05/15   Prostate adenocarcinoma: pt considering treatment options as of 02/19/15  . ROBOT ASSISTED LAPAROSCOPIC RADICAL PROSTATECTOMY N/A 04/30/2015   Procedure: XI ROBOTIC ASSISTED LAPAROSCOPIC RADICAL PROSTATECTOMY LEVEL 2;  Surgeon: [name], MD;  [facility];  Service: Urology;  Laterality: N/A;  . TEE WITHOUT CARDIOVERSION N/A 05/12/2016   Procedure: TRANSESOPHAGEAL ECHOCARDIOGRAM (TEE);  Surgeon: [name], MD;  Location: [facility];  Service: Open Heart Surgery;  Laterality: N/A;  . TRANSTHORACIC ECHOCARDIOGRAM  03/22/2006; 05/2016; 07/2016; 07/2017   2008: EF 65%, normal valves, no wall motion abnormalties, LA size normal.  05/2016 in context of non-STEMI, EF 20-25%, mild AV and MV regurg.  08/05/16: EF 30-35%, akinesis of mid lateral myoc, grd II DD, mild aortic 07/2017: EF 25-30%, mult areas of akinesis, grd I DD.     Family History  Problem Relation Age of Onset  . Hypertension Mother   . Cancer - Other Mother 83       breast  . Cancer Father        Lung  ca age 101  . Diabetes Sister      Social History   Socioeconomic History  . Marital status: Married    Spouse name: Not on file  . Number of children: Not on file  . Years of education: Not on file  . Highest education level: Not on file  Occupational History  . Not on file  Tobacco Use  . Smoking status: Former Smoker    Packs/day: 1.00    Years: 55.00    Pack years: 55.00    Types: Cigarettes  . Smokeless tobacco: Never Used  . Tobacco comment: STOPPED AT SURGERY  Substance and Sexual Activity  . Alcohol use: No  . Drug use: No  . Sexual activity: Not on  file  Other Topics Concern  . Not on file  Social History Narrative   Married, 2 grown children.   HS education.  Orig from [place], lives there now.   Occupation: maintenance.  Drives a tractor a lot, drives a pickup truck a lot, rides horses a lot.   Tobacco: 20 pack-yr hx (current as of 07/2012)   No drugs.   Alcohol: none   Social Determinants of Health   Financial Resource Strain:   . Difficulty of Paying Living Expenses: Not on file  Food Insecurity:   . Worried About Charity fundraiser in the Last Year: Not on file  . Ran Out of Food in the Last Year: Not on file  Transportation Needs:   . Lack of Transportation (Medical): Not on file  . Lack of Transportation (Non-Medical): Not on file  Physical Activity:   . Days of Exercise per Week: Not on file  . Minutes of Exercise per Session: Not on file  Stress:   . Feeling of Stress : Not on file  Social Connections:   . Frequency of Communication with Friends and Family: Not on file  . Frequency of Social Gatherings with Friends and Family: Not on file  . Attends Religious Services: Not on file  . Active Member of Clubs or Organizations: Not on file  . Attends Archivist Meetings: Not on file  . Marital Status: Not on file  Intimate Partner Violence:   . Fear of Current or Ex-Partner: Not on file  . Emotionally Abused: Not on file  .  Physically Abused: Not on file  . Sexually Abused: Not on file     BP 128/76   Pulse 60   Ht 5\' 11"  (1.803 m)   Wt 166 lb 3.2 oz (75.4 kg)   SpO2 97%   BMI 23.18 kg/m   Physical Exam:  Well appearing NAD HEENT: Unremarkable Neck:  No JVD, no thyromegally Lymphatics:  No adenopathy Back:  No CVA tenderness Lungs:  Clear with no wheezes HEART:  Regular rate rhythm, no murmurs, no rubs, no clicks Abd:  soft, positive bowel sounds, no organomegally, no rebound, no guarding Ext:  2 plus pulses, no edema, no cyanosis, no clubbing Skin:  No rashes no nodules Neuro:  CN II through XII intact, motor grossly intact  EKG - nsr with PVC's, QRS 120 ms  Assess/Plan: 1. ICM - he has class 2 symptoms and an EF of 30%. I have discussed the treatment options with the patient and the risks/benefits/goals/expectations of ICD insertion were reviewed and he wishes to proceed.  2. Chronic systolic heart failure - his symptoms are class 2. He will continue his current meds. 3. Dyslipidemia - he is statin intolerant. I will defer decisions about Repatha to Dr. Lenore Cordia.   Daniel [name].D.

## 2019-03-15 NOTE — Patient Instructions (Addendum)
Medication Instructions:  Your physician recommends that you continue on your current medications as directed. Please refer to the Current Medication list given to you today.  Labwork: None ordered.  Testing/Procedures: Your physician has recommended that you have a defibrillator inserted. An implantable cardioverter defibrillator (ICD) is a small device that is placed in your chest or, in rare cases, your abdomen. This device uses electrical pulses or shocks to help control life-threatening, irregular heartbeats that could lead the heart to suddenly stop beating (sudden cardiac arrest). Leads are attached to the ICD that goes into your heart. This is done in the hospital and usually requires an overnight stay. Please see the instruction sheet given to you today for more information.  Follow-Up:  You will contact me when you are ready to schedule your procedure    Any Other Special Instructions Will Be Listed Below (If Applicable).  If you need a refill on your cardiac medications before your next appointment, please call your pharmacy.    Cardioverter Defibrillator Implantation  An implantable cardioverter defibrillator (ICD) is a small device that is placed under the skin in the chest or abdomen. An ICD consists of a battery, a small computer (pulse generator), and wires (leads) that go into the heart. An ICD is used to detect and correct two types of dangerous irregular heartbeats (arrhythmias):  A rapid heart rhythm (tachycardia).  An arrhythmia in which the lower chambers of the heart (ventricles) contract in an uncoordinated way (fibrillation). When an ICD detects tachycardia, it sends a low-energy shock to the heart to restore the heartbeat to normal (cardioversion). This signal is usually painless. If cardioversion does not work or if the ICD detects fibrillation, it delivers a high-energy shock to the heart (defibrillation) to restart the heart. This shock may feel like a strong  jolt in the chest. Your health care provider may prescribe an ICD if:  You have had an arrhythmia that originated in the ventricles.  Your heart has been damaged by a disease or heart condition. Sometimes, ICDs are programmed to act as a device called a pacemaker. Pacemakers can be used to treat a slow heartbeat (bradycardia) or tachycardia by taking over the heart rate with electrical impulses. Tell a health care provider about:  Any allergies you have.  All medicines you are taking, including vitamins, herbs, eye drops, creams, and over-the-counter medicines.  Any problems you or family members have had with anesthetic medicines.  Any blood disorders you have.  Any surgeries you have had.  Any medical conditions you have.  Whether you are pregnant or may be pregnant. What are the risks? Generally, this is a safe procedure. However, problems may occur, including:  Swelling, bleeding, or bruising.  Infection.  Blood clots.  Damage to other structures or organs, such as nerves, blood vessels, or the heart.  Allergic reactions to medicines used during the procedure. What happens before the procedure? Staying hydrated Follow instructions from your health care provider about hydration, which may include:  Up to 2 hours before the procedure - you may continue to drink clear liquids, such as water, clear fruit juice, black coffee, and plain tea. Eating and drinking restrictions Follow instructions from your health care provider about eating and drinking, which may include:  8 hours before the procedure - stop eating heavy meals or foods such as meat, fried foods, or fatty foods.  6 hours before the procedure - stop eating light meals or foods, such as toast or cereal.  6 hours  before the procedure - stop drinking milk or drinks that contain milk.  2 hours before the procedure - stop drinking clear liquids. Medicine Ask your health care provider about:  Changing or  stopping your normal medicines. This is important if you take diabetes medicines or blood thinners.  Taking medicines such as aspirin and ibuprofen. These medicines can thin your blood. Do not take these medicines before your procedure if your doctor tells you not to. Tests  You may have blood tests.  You may have a test to check the electrical signals in your heart (electrocardiogram, ECG).  You may have imaging tests, such as a chest X-ray. General instructions  For 24 hours before the procedure, stop using products that contain nicotine or tobacco, such as cigarettes and e-cigarettes. If you need help quitting, ask your health care provider.  Plan to have someone take you home from the hospital or clinic.  You may be asked to shower with a germ-killing soap. What happens during the procedure?  To reduce your risk of infection: ? Your health care team will wash or sanitize their hands. ? Your skin will be washed with soap. ? Hair may be removed from the surgical area.  Small monitors will be put on your body. They will be used to check your heart, blood pressure, and oxygen level.  An IV tube will be inserted into one of your veins.  You will be given one or more of the following: ? A medicine to help you relax (sedative). ? A medicine to numb the area (local anesthetic). ? A medicine to make you fall asleep (general anesthetic).  Leads will be guided through a blood vessel into your heart and attached to your heart muscles. Depending on the ICD, the leads may go into one ventricle or they may go into both ventricles and into an upper chamber of the heart. An X-ray machine (fluoroscope) will be usedto help guide the leads.  A small incision will be made to create a deep pocket under your skin.  The pulse generator will be placed into the pocket.  The ICD will be tested.  The incision will be closed with stitches (sutures), skin glue, or staples.  A bandage (dressing) will  be placed over the incision. This procedure may vary among health care providers and hospitals. What happens after the procedure?  Your blood pressure, heart rate, breathing rate, and blood oxygen level will be monitored often until the medicines you were given have worn off.  A chest X-ray will be taken to check that the ICD is in the right place.  You will need to stay in the hospital for 1-2 days so your health care provider can make sure your ICD is working.  Do not drive for 24 hours if you received a sedative. Ask your health care provider when it is safe for you to drive.  You may be given an identification card explaining that you have an ICD. Summary  An implantable cardioverter defibrillator (ICD) is a small device that is placed under the skin in the chest or abdomen. It is used to detect and correct dangerous irregular heartbeats (arrhythmias).  An ICD consists of a battery, a small computer (pulse generator), and wires (leads) that go into the heart.  When an ICD detects rapid heart rhythm (tachycardia), it sends a low-energy shock to the heart to restore the heartbeat to normal (cardioversion). If cardioversion does not work or if the ICD detects uncoordinated heart contractions (  fibrillation), it delivers a high-energy shock to the heart (defibrillation) to restart the heart.  You will need to stay in the hospital for 1-2 days to make sure your ICD is working. This information is not intended to replace advice given to you by your health care provider. Make sure you discuss any questions you have with your health care provider. Document Revised: 12/30/2016 Document Reviewed: 01/27/2016 Elsevier Patient Education  2020 [employer].

## 2019-03-19 ENCOUNTER — Encounter: Payer: Self-pay | Admitting: Family Medicine

## 2019-04-02 ENCOUNTER — Telehealth: Payer: Self-pay | Admitting: Internal Medicine

## 2019-04-02 NOTE — Telephone Encounter (Signed)
Patient calling stating he is returning a call to schedule his defibrillator surgery.

## 2019-04-02 NOTE — Telephone Encounter (Signed)
Pt states he would like to have his device procedure on 3/12.  Wants to come for labs and covid test on 3/5.  Advised I will send message to Dr. Tanna Furry nurse and have her f/u once back in the office to get everything scheduled.  Pt appreciative for call.

## 2019-04-03 ENCOUNTER — Telehealth: Payer: Self-pay | Admitting: Cardiology

## 2019-04-03 NOTE — Telephone Encounter (Signed)
New message   [name] wants to know if he can cancel the appt on 3/10 with Dr. Percival Spanish, it's a 6 week f/u. He is scheduled for an ICD implant on 3/12 with Dr. Lovena Le. He said he wasn't sure if he needed the appt right now because of that. Or if he could wait until after the procedure and f/u with Dr. Percival Spanish.

## 2019-04-03 NOTE — Telephone Encounter (Signed)
Follow up  Pt called to follow up his labs and covid appt.  Please advise

## 2019-04-03 NOTE — Telephone Encounter (Signed)
Pt is scheduled for procedure on 3/12  Labs/covid test 3/9  Verbal instruction given/also mailing hard copy  Pt given soap and lab orders at last [place]   Work up complete.

## 2019-04-03 NOTE — Telephone Encounter (Signed)
Spoke with patient. After discussing appointment and if he should reschedule patient decided to keep appointment on 3/10 since he is having a cardiac procedure performed on 3/12. He would like to be examined and cleared for the procedure.

## 2019-04-09 ENCOUNTER — Other Ambulatory Visit: Payer: Self-pay

## 2019-04-09 ENCOUNTER — Other Ambulatory Visit (HOSPITAL_COMMUNITY)
Admission: RE | Admit: 2019-04-09 | Discharge: 2019-04-09 | Disposition: A | Discharge status: Home / Self Care | Payer: Medicare Other | Source: Ambulatory Visit | Attending: Internal Medicine | Admitting: Internal Medicine

## 2019-04-09 DIAGNOSIS — Z01812 Encounter for preprocedural laboratory examination: Secondary | ICD-10-CM | POA: Insufficient documentation

## 2019-04-09 DIAGNOSIS — I2510 Atherosclerotic heart disease of native coronary artery without angina pectoris: Secondary | ICD-10-CM

## 2019-04-09 DIAGNOSIS — I255 Ischemic cardiomyopathy: Secondary | ICD-10-CM

## 2019-04-09 DIAGNOSIS — Z20822 Contact with and (suspected) exposure to covid-19: Secondary | ICD-10-CM | POA: Diagnosis not present

## 2019-04-09 DIAGNOSIS — Z79899 Other long term (current) drug therapy: Secondary | ICD-10-CM

## 2019-04-09 DIAGNOSIS — I10 Essential (primary) hypertension: Secondary | ICD-10-CM

## 2019-04-09 LAB — BASIC METABOLIC PANEL
Anion gap: 10 (ref 5–15)
BUN: 14 mg/dL (ref 8–23)
CO2: 27 mmol/L (ref 22–32)
Calcium: 9.3 mg/dL (ref 8.9–10.3)
Chloride: 102 mmol/L (ref 98–111)
Creatinine, Ser: 1.11 mg/dL (ref 0.61–1.24)
GFR calc Af Amer: >60 mL/min (ref >60)
GFR calc non Af Amer: >60 mL/min (ref >60)
Glucose, Bld: 182 mg/dL — ABNORMAL HIGH (ref 70–99)
Potassium: 4 mmol/L (ref 3.5–5.1)
Sodium: 139 mmol/L (ref 135–145)

## 2019-04-09 LAB — CBC WITH DIFFERENTIAL/PLATELET
Abs Immature Granulocytes: 0.03 10*3/uL (ref 0.00–0.07)
Basophils Absolute: 0.1 10*3/uL (ref 0.0–0.1)
Basophils Relative: 1 %
Eosinophils Absolute: 0.1 10*3/uL (ref 0.0–0.5)
Eosinophils Relative: 2 %
HCT: 49.3 % (ref 39.0–52.0)
Hemoglobin: 16.1 g/dL (ref 13.0–17.0)
Immature Granulocytes: 1 %
Lymphocytes Relative: 24 %
Lymphs Abs: 1.2 10*3/uL (ref 0.7–4.0)
MCH: 31.6 pg (ref 26.0–34.0)
MCHC: 32.7 g/dL (ref 30.0–36.0)
MCV: 96.9 fL (ref 80.0–100.0)
Monocytes Absolute: 0.3 10*3/uL (ref 0.1–1.0)
Monocytes Relative: 6 %
Neutro Abs: 3.4 10*3/uL (ref 1.7–7.7)
Neutrophils Relative %: 66 %
Platelets: 168 10*3/uL (ref 150–400)
RBC: 5.09 MIL/uL (ref 4.22–5.81)
RDW: 13 % (ref 11.5–15.5)
WBC: 5 10*3/uL (ref 4.0–10.5)
nRBC: 0 % (ref 0.0–0.2)

## 2019-04-09 LAB — SARS CORONAVIRUS 2 (TAT 6-24 HRS): SARS Coronavirus 2: NEGATIVE

## 2019-04-09 NOTE — Progress Notes (Signed)
Cardiology Office Note   Date:  04/10/2019   ID:  Daniel Bar., DOB 31-May-1945, MRN [id number]  PCP:  [name], MD  Cardiologist:   [name], MD   Chief Complaint  Patient presents with  . Coronary Artery Disease      History of Present Illness: Daniel Prak. is a 74 y.o. male who presents for follow up of CAD s/p CABG LIMA to D1, SVG to LAD, SVG to PDA on 05/12/2016, HLD intolerant to statin, HTN, ICM with baseline EF 30-35%, and h/o prostate CA.His last cardiac catheterization on 05/09/2016 showed EF 20-25%, akinesis of the distal inferoapical segment with significant hypokinesis globally consistent with ischemiccardiomyopathy, multivessel disease with 70% followed by total occlusion of large LAD with distal collateral arteries, 50% proximal left circumflex stenosis with total occlusion of distal left circumflex, total occlusion of mid RCA with distal collateral artery. Bypass surgery was recommended and he eventually underwent successful CABG 3 by Dr. Roxan Hockey on 05/12/2016 with LIMA to diagonal, SVG to LAD, SVG to PDA. He developed postoperative atrial fibrillation and was treated with IV amiodarone with conversion to sinus rhythm prior to discharge. He had a follow-up echocardiogram on 08/05/2016 which revealed LV EF remains low at 30-35% however slightly improved from his previous 20-25%.  He was placed on carvedilol and Entresto, spironolactone was discontinued due to elevated potassium level. Unfortunately, he went back to the hospital on 11/20/2016 with shortness breath, diffuse rash and presyncope. He was given prednisone for his rash. He stopped the [facility] and restarted ACE inhibitor as he was sure the rash was related to the former.  Last EF in July was 25 - 30%.  He has not wanted to have an ICD.  He could not take the BiDil.  His blood pressure dropped too low.  He had a low heart rate and we stopped Coreg.  He also stop spironolactone as he thought it was  making his heart skip murmur.  Since I last saw him he saw Dr. Lovena Le and the plan is for an ICD in two days.  He agreed to take spironolactone at the last appt.   At the last appt he did have fatigue.  I checked a TSH and CBC which were normal.  He does report continued fatigue.   He does report that his heart rate goes in the 30s sometimes.  He has not had any presyncope or syncope.  He says his blood pressures typically in the 130s but there are some question of how accurate his monitors.  He is not having any chest pressure, neck or arm discomfort.  Is not had any shortness of breath, PND or orthopnea.  He does seem like he is taking the medications that I prescribed.    Past Medical History:  Diagnosis Date  . Anxiety   . Atrial fibrillation ([place])    Limited episode, emergency room, June, 2013, spontaneous conversion to sinus rhythm, was evaluated By [name] 2013- no further visits.  Post-op (CABG) a-fib, converted on amio but pt self d/c'd this med due to DOE/side effect profile.  [name] CAD (coronary artery disease) 05/2016   a. 05/2016: NSTEMI with cath showing 3-vessel disease. Underwent CABG on 05/12/16 with LIMA-D1, SVG-LAD, and SVG-PDA.   [name] COPD (chronic obstructive pulmonary disease) ([place]) fall 2016   Bullous changes noted on lower lung images of CT abd done by urology  . Diverticulitis   . GERD (gastroesophageal reflux disease)   .  Has received pneumococcal vaccination   . Hyperlipidemia    statin intolerant  . Hypertension   . Ischemic cardiomyopathy 05/2016   EF 20-25% at the time of NSTEMI/CABG.  Repeat EF 07/2016 30-35%.  07/2017 EF 25-30%, pt intol of entresto, bidil, and BB--for ICD 03/2019.  . Microscopic hematuria Fall 2016   CT nl except nonobstructing stones.  Cystoscopy normal 12/30/14 ([name])  . Nephrolithiasis    11 mm stone on R, 2 mm stone on L, ureters clear  . Non-STEMI (non-ST elevated myocardial infarction) ([place]) 05/2016   with impaired LV function  .  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  Urol ([name]) at [facility]; [name] referred him to [name] 03/2015: patient got bilat nerve sparing , robot assisted laparoscopic radical prostatectomy and pelvic lymphadenectomy.  Urol f/u, PSA undetectable on repeat 04/22/16 and 10/28/16; pt chose PSA surveill over adjuv rad tx.   . Tobacco dependence    down to 1-2 cigs per day as of 11/2015.  Restarted 08/2016.  [name] Urinary retention 05/2015   occurred s/p foley removal    Past Surgical History:  Procedure Laterality Date  . aortic ultrasound  07/2012   NO AAA  . COLONOSCOPY  approx 2011 per pt   Done by surgeon in [place] after a bout of diverticulitis; normal per pt report--was told to repeat in 10 yrs.  . CORONARY ARTERY BYPASS GRAFT N/A 05/12/2016   Procedure: CORONARY ARTERY BYPASS GRAFTING (CABG) TIMES THREE USING LEFT INTERNAL MAMMARY ARTERY AND RIGHT SAPHENOUS LEG VEIN HARVESTED ENDOSCOPICALLY;  Surgeon: [name], MD;  Location: [place];  Service: Open Heart Surgery;  Laterality: N/A;  . INGUINAL HERNIA REPAIR  2003 and 2004   Both sides.  [name] LAPAROSCOPY  2017  . LEFT HEART CATH AND CORONARY ANGIOGRAPHY N/A 05/09/2016   Procedure: Left Heart Cath and Coronary Angiography;  Surgeon: [name], MD;  Location: [place] [facility];  Service: Cardiovascular;  Laterality: N/A;  . LITHOTRIPSY  2004   Dr. [place] Pink in [place].  [name] LYMPHADENECTOMY Bilateral 04/30/2015   Procedure: PELVIC LYMPHADENECTOMY;  Surgeon: [name], MD;  Location: [facility];  Service: Urology;  Laterality: Bilateral;  . PROSTATE BIOPSY  02/05/15   Prostate adenocarcinoma: pt considering treatment options as of 02/19/15  . ROBOT ASSISTED LAPAROSCOPIC RADICAL PROSTATECTOMY N/A 04/30/2015   Procedure: XI ROBOTIC ASSISTED LAPAROSCOPIC RADICAL PROSTATECTOMY LEVEL 2;  Surgeon: [name], MD;  Location: [facility];  Service: Urology;  Laterality: N/A;  . TEE WITHOUT  CARDIOVERSION N/A 05/12/2016   Procedure: TRANSESOPHAGEAL ECHOCARDIOGRAM (TEE);  Surgeon: [name], MD;  Location: [place];  Service: Open Heart Surgery;  Laterality: N/A;  . TRANSTHORACIC ECHOCARDIOGRAM  03/22/2006; 05/2016; 07/2016; 07/2017   2008: EF 65%, normal valves, no wall motion abnormalties, LA size normal.  05/2016 in context of non-STEMI, EF 20-25%, mild AV and MV regurg.  08/05/16: EF 30-35%, akinesis of mid lateral myoc, grd II DD, mild aortic 07/2017: EF 25-30%, mult areas of akinesis, grd I DD.     Current Outpatient Medications  Medication Sig Dispense Refill  . ALPRAZolam (XANAX) 0.25 MG tablet TAKE ONE TABLET BY MOUTH AT BEDTIME AS NEEDED (Patient taking differently: Take 0.25 mg by mouth at bedtime as needed (sleep). ) 30 tablet 5  . aspirin EC 81 MG tablet Take 81 mg by mouth daily.    . benazepril (LOTENSIN) 40 MG tablet Take 1 whole  tablet by mouth twice daily (Patient taking differently: Take 40 mg by mouth in the morning and at bedtime. ) 60 tablet 6  . furosemide (LASIX) 20 MG tablet Take 20mg  (1 tablet) as needed for swelling. (Patient taking differently: Take 20 mg by mouth daily as needed (swelling.). Take 20mg  (1 tablet) as needed for swelling.) 30 tablet 3  . loratadine (CLARITIN) 10 MG tablet Take 10 mg by mouth daily as needed for allergies.     . Omega-3 Fatty Acids (FISH OIL) 1000 MG CAPS Take 1,000 mg by mouth daily.     Vladimir Faster Glycol-Propyl Glycol (LUBRICANT EYE DROPS) 0.4-0.3 % SOLN Place 1 drop into both eyes 3 (three) times daily as needed (dry/irritated eyes.).    [name] spironolactone (ALDACTONE) 25 MG tablet Take 1 tablet (25 mg total) by mouth daily. (Patient taking differently: Take 25 mg by mouth daily as needed (elevated blood pressure.). ) 90 tablet 3  . Vitamin D, Cholecalciferol, 400 units CAPS Take 400 Units by mouth daily.      No current facility-administered medications for this visit.    Allergies:   Entresto [sacubitril-valsartan],  Amlodipine, Contrast media [iodinated diagnostic agents], and Hydralazine    ROS:  Please see the history of present illness.   Otherwise, review of systems are positive for none.   All other systems are reviewed and negative.    PHYSICAL EXAM: VS:  BP (!) 172/98   Pulse (!) 48   Ht 5\' 11"  (1.803 m)   Wt 166 lb (75.3 kg)   BMI 23.15 kg/m  , BMI Body mass index is 23.15 kg/m.  GENERAL:  Well appearing NECK:  No jugular venous distention, waveform within normal limits, carotid upstroke brisk and symmetric, no bruits, no thyromegaly LUNGS:  Clear to auscultation bilaterally CHEST:  Unremarkable HEART:  PMI not displaced or sustained,S1 and S2 within normal limits, no S3, no S4, no clicks, no rubs, no murmurs ABD:  Flat, positive bowel sounds normal in frequency in pitch, no bruits, no rebound, no guarding, no midline pulsatile mass, no hepatomegaly, no splenomegaly EXT:  2 plus pulses throughout, no edema, no cyanosis no clubbing  EKG:  EKG is not done today   Recent Labs: 07/27/2018: ALT 14 02/26/2019: TSH 0.980 04/09/2019: BUN 14; Creatinine, Ser 1.11; Hemoglobin 16.1; Platelets 168; Potassium 4.0; Sodium 139    Lipid Panel    Component Value Date/Time   CHOL 144 07/27/2018 1009   CHOL 137 09/16/2016 0858   TRIG 53.0 07/27/2018 1009   HDL 31.50 (L) 07/27/2018 1009   HDL 35 (L) 09/16/2016 0858   CHOLHDL 5 07/27/2018 1009   VLDL 10.6 07/27/2018 1009   LDLCALC 102 (H) 07/27/2018 1009   LDLCALC 88 09/16/2016 0858      Wt Readings from Last 3 Encounters:  04/10/19 166 lb (75.3 kg)  03/15/19 166 lb 3.2 oz (75.4 kg)  02/20/19 167 lb (75.8 kg)      Other studies Reviewed: Additional studies/ records that were reviewed today include: EP note Review of the above records demonstrates:  See elsewhere  ASSESSMENT AND PLAN:  CAD s/p CABG:     He is not having any chest pain.  No further testing.  Ischemic cardiomyopathy:       EF remains low at 25 - 30%.  He has not  tolerated med titration.  Very happy that he is agreed to get the defibrillator we talked about this again today.  He has been very reluctant to take  any medications.  In particular cost has been a consideration so Delene Loll was not possible.  I have avoided beta-blocker because of his bradycardia arrhythmia (part of which is fairly under counting of his ectopy).  Also he has not wanted to take anything really to proceed side effects such as worsening fatigue.  We will get more accurate blood pressure readings when he is hospitalized and we might be able to convince him to titrate the spironolactone.   Hypertension: As above.  Hyperlipidemia:   He is not interested in taking a statin.   Fatigue:   He had normal TSH and CBC recently.  I do not have a clear cardiac etiology for this.  PAF:    He has not had any symptomatic recurrence.  No change in therapy.   COVID EDUCATION:    He has no interest in taking the vaccine.   Current medicines are reviewed at length with the patient today.  The patient does not have concerns regarding medicines.  The following changes have been made: None  Labs/ tests ordered today include:  None No orders of the defined types were placed in this encounter.    Disposition:   FU with me in six months.   Signed, [name], MD  04/10/2019 12:19 PM    [place]

## 2019-04-10 ENCOUNTER — Other Ambulatory Visit: Payer: Self-pay

## 2019-04-10 ENCOUNTER — Encounter: Payer: Self-pay | Admitting: Cardiology

## 2019-04-10 ENCOUNTER — Ambulatory Visit: Payer: Medicare Other | Admitting: Cardiology

## 2019-04-10 VITALS — BP 172/98 | HR 48 | Ht 71.0 in | Wt 166.0 lb

## 2019-04-10 DIAGNOSIS — Z7189 Other specified counseling: Secondary | ICD-10-CM

## 2019-04-10 DIAGNOSIS — I10 Essential (primary) hypertension: Secondary | ICD-10-CM | POA: Diagnosis not present

## 2019-04-10 DIAGNOSIS — I2510 Atherosclerotic heart disease of native coronary artery without angina pectoris: Secondary | ICD-10-CM

## 2019-04-10 DIAGNOSIS — I480 Paroxysmal atrial fibrillation: Secondary | ICD-10-CM

## 2019-04-10 DIAGNOSIS — R5383 Other fatigue: Secondary | ICD-10-CM

## 2019-04-10 DIAGNOSIS — I255 Ischemic cardiomyopathy: Secondary | ICD-10-CM

## 2019-04-10 NOTE — Patient Instructions (Signed)
Medication Instructions:  The current medical regimen is effective;  continue present plan and medications.  *If you need a refill on your cardiac medications before your next appointment, please call your pharmacy*  Follow-Up: At [facility], you and your health needs are our priority.  As part of our continuing mission to provide you with exceptional heart care, we have created designated Provider Care Teams.  These Care Teams include your primary Cardiologist (physician) and Advanced Practice Providers (APPs -  Physician Assistants and Nurse Practitioners) who all work together to provide you with the care you need, when you need it.  We recommend signing up for the patient portal called "MyChart".  Sign up information is provided on this After Visit Summary.  MyChart is used to connect with patients for Virtual Visits (Telemedicine).  Patients are able to view lab/test results, encounter notes, upcoming appointments, etc.  Non-urgent messages can be sent to your provider as well.   To learn more about what you can do with MyChart, go to [email or web address].    Your next appointment:   6 month(s)  The format for your next appointment:   In Person  Provider:   Kashif Hochrein, MD   Thank you for choosing [facility]!!     

## 2019-04-11 NOTE — Progress Notes (Signed)
Instructed patient on the following items: Arrival time 0930 Nothing to eat or drink after midnight No meds AM of procedure Responsible person to drive you home and stay with you for 24 hrs Wash with special soap night before and morning of procedure  

## 2019-04-12 ENCOUNTER — Ambulatory Visit (HOSPITAL_COMMUNITY): Payer: Medicare Other

## 2019-04-12 ENCOUNTER — Ambulatory Visit (HOSPITAL_COMMUNITY)
Admission: RE | Admit: 2019-04-12 | Discharge: 2019-04-12 | Disposition: A | Discharge status: Home / Self Care | Payer: Medicare Other | Attending: Internal Medicine | Admitting: Internal Medicine

## 2019-04-12 ENCOUNTER — Encounter (HOSPITAL_COMMUNITY)
Admission: RE | Disposition: A | Discharge status: Home / Self Care | Payer: Self-pay | Source: Home / Self Care | Attending: Internal Medicine

## 2019-04-12 ENCOUNTER — Other Ambulatory Visit: Payer: Self-pay

## 2019-04-12 DIAGNOSIS — I5022 Chronic systolic (congestive) heart failure: Secondary | ICD-10-CM | POA: Diagnosis not present

## 2019-04-12 DIAGNOSIS — F419 Anxiety disorder, unspecified: Secondary | ICD-10-CM | POA: Insufficient documentation

## 2019-04-12 DIAGNOSIS — J449 Chronic obstructive pulmonary disease, unspecified: Secondary | ICD-10-CM | POA: Diagnosis not present

## 2019-04-12 DIAGNOSIS — K219 Gastro-esophageal reflux disease without esophagitis: Secondary | ICD-10-CM | POA: Diagnosis not present

## 2019-04-12 DIAGNOSIS — Z951 Presence of aortocoronary bypass graft: Secondary | ICD-10-CM | POA: Insufficient documentation

## 2019-04-12 DIAGNOSIS — Z87891 Personal history of nicotine dependence: Secondary | ICD-10-CM | POA: Insufficient documentation

## 2019-04-12 DIAGNOSIS — I2510 Atherosclerotic heart disease of native coronary artery without angina pectoris: Secondary | ICD-10-CM | POA: Diagnosis not present

## 2019-04-12 DIAGNOSIS — Z006 Encounter for examination for normal comparison and control in clinical research program: Secondary | ICD-10-CM | POA: Diagnosis not present

## 2019-04-12 DIAGNOSIS — Z79899 Other long term (current) drug therapy: Secondary | ICD-10-CM | POA: Insufficient documentation

## 2019-04-12 DIAGNOSIS — Z7982 Long term (current) use of aspirin: Secondary | ICD-10-CM | POA: Insufficient documentation

## 2019-04-12 DIAGNOSIS — I110 Hypertensive heart disease with heart failure: Secondary | ICD-10-CM | POA: Insufficient documentation

## 2019-04-12 DIAGNOSIS — E785 Hyperlipidemia, unspecified: Secondary | ICD-10-CM | POA: Diagnosis not present

## 2019-04-12 DIAGNOSIS — Z95810 Presence of automatic (implantable) cardiac defibrillator: Secondary | ICD-10-CM

## 2019-04-12 DIAGNOSIS — Z8546 Personal history of malignant neoplasm of prostate: Secondary | ICD-10-CM | POA: Diagnosis not present

## 2019-04-12 DIAGNOSIS — I255 Ischemic cardiomyopathy: Secondary | ICD-10-CM | POA: Insufficient documentation

## 2019-04-12 DIAGNOSIS — I252 Old myocardial infarction: Secondary | ICD-10-CM | POA: Diagnosis not present

## 2019-04-12 HISTORY — PX: ICD IMPLANT: EP1208

## 2019-04-12 SURGERY — ICD IMPLANT

## 2019-04-12 MED ORDER — MIDAZOLAM HCL 5 MG/5ML IJ SOLN
INTRAMUSCULAR | Status: AC
  Filled 2019-04-12: qty 5

## 2019-04-12 MED ORDER — FUROSEMIDE 10 MG/ML IJ SOLN
20.0000 mg | Freq: Once | INTRAMUSCULAR | Status: AC
  Administered 2019-04-12: 20 mg via INTRAVENOUS
  Filled 2019-04-12: qty 2

## 2019-04-12 MED ORDER — FENTANYL CITRATE (PF) 100 MCG/2ML IJ SOLN
INTRAMUSCULAR | Status: DC | PRN
  Administered 2019-04-12 (×3): 25 ug via INTRAVENOUS

## 2019-04-12 MED ORDER — SODIUM CHLORIDE 0.9 % IV SOLN
INTRAVENOUS | Status: AC
  Filled 2019-04-12: qty 2

## 2019-04-12 MED ORDER — CARVEDILOL 12.5 MG PO TABS
12.5000 mg | ORAL_TABLET | Freq: Once | ORAL | Status: AC
  Administered 2019-04-12: 12.5 mg via ORAL
  Filled 2019-04-12: qty 1

## 2019-04-12 MED ORDER — CEFAZOLIN SODIUM-DEXTROSE 2-4 GM/100ML-% IV SOLN
2.0000 g | INTRAVENOUS | Status: AC
  Administered 2019-04-12: 2 g via INTRAVENOUS
  Filled 2019-04-12: qty 100

## 2019-04-12 MED ORDER — LIDOCAINE HCL (PF) 1 % IJ SOLN
INTRAMUSCULAR | Status: DC | PRN
  Administered 2019-04-12: 60 mL

## 2019-04-12 MED ORDER — ACETAMINOPHEN 325 MG PO TABS: 325.0000 mg | ORAL_TABLET | ORAL | Status: DC | PRN

## 2019-04-12 MED ORDER — HEPARIN (PORCINE) IN NACL 1000-0.9 UT/500ML-% IV SOLN
INTRAVENOUS | Status: AC
  Filled 2019-04-12: qty 500

## 2019-04-12 MED ORDER — LIDOCAINE HCL 1 % IJ SOLN
INTRAMUSCULAR | Status: AC
  Filled 2019-04-12: qty 60

## 2019-04-12 MED ORDER — SODIUM CHLORIDE 0.9 % IV SOLN
80.0000 mg | INTRAVENOUS | Status: AC
  Administered 2019-04-12: 80 mg
  Filled 2019-04-12: qty 2

## 2019-04-12 MED ORDER — FENTANYL CITRATE (PF) 100 MCG/2ML IJ SOLN
INTRAMUSCULAR | Status: AC
  Filled 2019-04-12: qty 2

## 2019-04-12 MED ORDER — MIDAZOLAM HCL 5 MG/5ML IJ SOLN
INTRAMUSCULAR | Status: DC | PRN
  Administered 2019-04-12 (×2): 2 mg via INTRAVENOUS
  Administered 2019-04-12: 1 mg via INTRAVENOUS

## 2019-04-12 MED ORDER — CEFAZOLIN SODIUM-DEXTROSE 2-4 GM/100ML-% IV SOLN
INTRAVENOUS | Status: AC
  Filled 2019-04-12: qty 100

## 2019-04-12 MED ORDER — HEPARIN (PORCINE) IN NACL 1000-0.9 UT/500ML-% IV SOLN
INTRAVENOUS | Status: DC | PRN
  Administered 2019-04-12: 500 mL

## 2019-04-12 MED ORDER — SODIUM CHLORIDE 0.9 % IV SOLN: INTRAVENOUS | Status: DC

## 2019-04-12 MED ORDER — ONDANSETRON HCL 4 MG/2ML IJ SOLN: 4.0000 mg | Freq: Four times a day (QID) | INTRAMUSCULAR | Status: DC | PRN

## 2019-04-12 MED ORDER — BENAZEPRIL HCL 40 MG PO TABS
40.0000 mg | ORAL_TABLET | Freq: Once | ORAL | Status: AC
  Administered 2019-04-12: 40 mg via ORAL
  Filled 2019-04-12: qty 1

## 2019-04-12 MED ORDER — CEFAZOLIN SODIUM-DEXTROSE 1-4 GM/50ML-% IV SOLN
1.0000 g | Freq: Once | INTRAVENOUS | Status: AC
  Administered 2019-04-12: 1 g via INTRAVENOUS
  Filled 2019-04-12: qty 50

## 2019-04-12 MED ORDER — CHLORHEXIDINE GLUCONATE 4 % EX LIQD
4.0000 "application " | Freq: Once | CUTANEOUS | Status: DC
  Filled 2019-04-12: qty 60

## 2019-04-12 MED ORDER — SPIRONOLACTONE 25 MG PO TABS
25.0000 mg | ORAL_TABLET | Freq: Once | ORAL | Status: AC
  Administered 2019-04-12: 25 mg via ORAL
  Filled 2019-04-12: qty 1

## 2019-04-12 SURGICAL SUPPLY — 8 items
CABLE SURGICAL S-101-97-12 (CABLE) ×3 IMPLANT
ICD GALLANT DR CDDRA500Q (ICD Generator) ×2 IMPLANT
LEAD DURATA 7122Q-65CM (Lead) ×2 IMPLANT
LEAD TENDRIL MRI 52CM LPA1200M (Lead) ×2 IMPLANT
PAD PRO RADIOLUCENT 2001M-C (PAD) ×3 IMPLANT
SHEATH 7FR PRELUDE SNAP 13 (SHEATH) ×2 IMPLANT
SHEATH 8FR PRELUDE SNAP 13 (SHEATH) ×2 IMPLANT
TRAY PACEMAKER INSERTION (PACKS) ×3 IMPLANT

## 2019-04-12 NOTE — Progress Notes (Signed)
Pt continues to have high BP, Amber NP paged. New orders given.

## 2019-04-12 NOTE — Discharge Instructions (Signed)
After Your ICD (Implantable Cardiac Defibrillator)   . You have a St. Jude ICD  ACTIVITY . Do not lift your arm above shoulder height for 1 week after your procedure. After 7 days, you may progress as below.     Friday April 19, 2019  Saturday April 20, 2019 Sunday April 21, 2019 Monday April 22, 2019   . Do not lift, push, pull, or carry anything over 10 pounds with the affected arm until 6 weeks (Friday May 24, 2019 ) after your procedure.   . Do NOT DRIVE until you have been seen for your wound check, or as long as instructed by your healthcare provider.   . Ask your healthcare provider when you can go back to work   INCISION/Dressing . If you are on a blood thinner such as Coumadin, Xarelto, Eliquis, Plavix, or Pradaxa please confirm with your provider when this should be resumed.   . Monitor your defibrillator site for redness, swelling, and drainage. Call the device clinic at [phone] if you experience these symptoms or fever/chills.  . If your incision is sealed with Steri-strips or staples, you may shower 10 days after your procedure or when told by your provider. Do not remove the steri-strips or let the shower hit directly on your site. You may wash around your site with soap and water.    [name] Avoid lotions, ointments, or perfumes over your incision until it is well-healed.  . You may use a hot tub or a pool AFTER your wound check appointment if the incision is completely closed.  . Your ICD is designed to protect you from life threatening heart rhythms. Because of this, you may receive a shock.   o 1 shock with no symptoms:  Call the office during business hours. o 1 shock with symptoms (chest pain, chest pressure, dizziness, lightheadedness, shortness of breath, overall feeling unwell):  Call 911. o If you experience 2 or more shocks in 24 hours:  Call 911. o If you receive a shock, you should not drive for 6 months per the [place] DMV IF you receive appropriate therapy  from your ICD.   . ICD Alerts:  Some alerts are vibratory and others beep. These are NOT emergencies. Please call our office to let us know. If this occurs at night or on weekends, it can wait until the next business day. Send a remote transmission.  . If your device is capable of reading fluid status (for heart failure), you will be offered monthly monitoring to review this with you.   DEVICE MANAGEMENT . Remote monitoring is used to monitor your ICD from home. This monitoring is scheduled every 91 days by our office. It allows [place] to keep an eye on the functioning of your device to ensure it is working properly. You will routinely see your Electrophysiologist annually (more often if necessary).   . You should receive your ID card for your new device in 4-8 weeks. Keep this card with you at all times once received. Consider wearing a medical alert bracelet or necklace.  . Your ICD  may be MRI compatible. This will be discussed at your next office visit/wound check.  You should avoid contact with strong electric or magnetic fields.    Do not use amateur (ham) radio equipment or electric (arc) welding torches. MP3 player headphones with magnets should not be used. Some devices are safe to use if held at least 12 inches (30 cm) from your defibrillator. These include power tools,  lawn mowers, and speakers. If you are unsure if something is safe to use, ask your health care provider.   When using your cell phone, hold it to the ear that is on the opposite side from the defibrillator. Do not leave your cell phone in a pocket over the defibrillator.   You may safely use electric blankets, heating pads, computers, and microwave ovens.  Call the office right away if:  You have chest pain.  You feel more than one shock.  You feel more short of breath than you have felt before.  You feel more light-headed than you have felt before.  Your incision starts to open up.  This information is not  intended to replace advice given to you by your health care provider. Make sure you discuss any questions you have with your health care provider.

## 2019-04-12 NOTE — Progress Notes (Signed)
Dr Lovena Le was contacted, BP issue was addressed with an  order for carvedilol 12.5mg  x one.

## 2019-04-12 NOTE — Progress Notes (Signed)
Dr Lovena Le was called/ updated and pt is able to be released .

## 2019-04-12 NOTE — Interval H&P Note (Signed)
History and Physical Interval Note:  04/12/2019 10:39 AM  Daniel [name].  has presented today for surgery, with the diagnosis of cardiomyopathy.  The various methods of treatment have been discussed with the patient and family. After consideration of risks, benefits and other options for treatment, the patient has consented to  Procedure(s): ICD IMPLANT (N/A) as a surgical intervention.  [name] history has been reviewed, patient examined, no change in status, stable for surgery.  I have reviewed the patient's chart and labs.  Questions were answered to the patient's satisfaction.     Cristopher Peru

## 2019-04-15 ENCOUNTER — Other Ambulatory Visit: Payer: Self-pay

## 2019-04-15 NOTE — Patient Outreach (Signed)
[facility]) Care Management  04/15/2019  Oather Gonsalves. 1945-04-10 [id number]   Medication Adherence call to Mr. Emrah Corriea [facility] Identifiers Verify spoke with patient he is past due on Benazepril 40 mg,patient explain he takes 1 tablet 2 times daily he explain he has enough for 3 more days and will ask his son to order it. Mr. Tinklenberg is showing past due under [place].   [place] Management Direct Dial [phone]  Fax [phone] [email or web address]

## 2019-04-22 ENCOUNTER — Telehealth: Payer: Self-pay | Admitting: Internal Medicine

## 2019-04-22 NOTE — Telephone Encounter (Signed)
Daniel [name] is calling requesting his son come to his upcoming appointment scheduled for tomorrow 04/23/19 due to him not being able to drive and his son bringing him. Please advise.

## 2019-04-22 NOTE — Telephone Encounter (Signed)
Patient informed that his son will not be able to attend the appointment due to Covid protocols. He was given the option to have [place] contact this son Edd Arbour and have him on speaker phone to be present for appointment.

## 2019-04-23 ENCOUNTER — Other Ambulatory Visit: Payer: Self-pay

## 2019-04-23 ENCOUNTER — Ambulatory Visit (INDEPENDENT_AMBULATORY_CARE_PROVIDER_SITE_OTHER): Payer: Medicare Other | Admitting: *Deleted

## 2019-04-23 DIAGNOSIS — I5022 Chronic systolic (congestive) heart failure: Secondary | ICD-10-CM | POA: Diagnosis not present

## 2019-04-23 DIAGNOSIS — R001 Bradycardia, unspecified: Secondary | ICD-10-CM | POA: Diagnosis not present

## 2019-04-23 LAB — CUP PACEART INCLINIC DEVICE CHECK
Battery Remaining Longevity: 72 mo
Brady Statistic RA Percent Paced: 89 %
Brady Statistic RV Percent Paced: 3.4 %
Date Time Interrogation Session: 20210323090200
HighPow Impedance: 69.75 Ohm
Implantable Lead Implant Date: 20210312
Implantable Lead Implant Date: 20210312
Implantable Lead Location: 753859
Implantable Lead Location: 753860
Implantable Pulse Generator Implant Date: 20210312
Lead Channel Impedance Value: 462.5 Ohm
Lead Channel Impedance Value: 550 Ohm
Lead Channel Pacing Threshold Amplitude: 0.5 V
Lead Channel Pacing Threshold Amplitude: 1 V
Lead Channel Pacing Threshold Pulse Width: 0.5 ms
Lead Channel Pacing Threshold Pulse Width: 0.5 ms
Lead Channel Sensing Intrinsic Amplitude: 12 mV
Lead Channel Sensing Intrinsic Amplitude: 2.9 mV
Lead Channel Setting Pacing Amplitude: 3.5 V
Lead Channel Setting Pacing Amplitude: 3.5 V
Lead Channel Setting Pacing Pulse Width: 0.5 ms
Lead Channel Setting Sensing Sensitivity: 0.5 mV
Pulse Gen Serial Number: 111016652

## 2019-04-23 NOTE — Progress Notes (Signed)
Wound check appointment. Steri-strips removed. Wound without redness or edema. Incision edges approximated, wound well healed. Normal device function. Thresholds, sensing, and impedances consistent with implant measurements. Device programmed at 3.5V for extra safety margin until 3 month visit. Histogram distribution appropriate for patient and level of activity. No mode switches or ventricular arrhythmias noted. Patient educated about wound care, arm mobility, lifting restrictions, shock plan. ROV in 3 months with implanting physician Dr. Lovena Le. Next home remote 07/12/19.

## 2019-04-23 NOTE — Patient Instructions (Signed)
Call office if you have any drainage, redness, swelling , or your wound site becomes warm to touch. Call the office if you develop a fever or chills.

## 2019-04-30 ENCOUNTER — Telehealth: Payer: Self-pay

## 2019-04-30 NOTE — Telephone Encounter (Signed)
Spoke with patients wife, ok to do riding [employer], NP 04/30/2019 1:39 PM

## 2019-04-30 NOTE — Telephone Encounter (Signed)
Patient called and wants to know if he can ride his lawn mower slow he was just implanted 04/12/2019

## 2019-05-23 ENCOUNTER — Encounter: Payer: Self-pay | Admitting: Family Medicine

## 2019-07-11 ENCOUNTER — Other Ambulatory Visit: Payer: Self-pay | Admitting: Family Medicine

## 2019-07-11 NOTE — Telephone Encounter (Signed)
Requesting: alprazolam Contract:01/26/18 UDS:n/a Last Visit:08/30/18 Next Visit:advised to f/u Jan Last Refill:12/22/18(30,5)  Please Advise. Medication pending

## 2019-07-12 ENCOUNTER — Telehealth: Payer: Self-pay

## 2019-07-12 ENCOUNTER — Ambulatory Visit (INDEPENDENT_AMBULATORY_CARE_PROVIDER_SITE_OTHER): Payer: Medicare Other | Admitting: *Deleted

## 2019-07-12 DIAGNOSIS — I255 Ischemic cardiomyopathy: Secondary | ICD-10-CM

## 2019-07-12 DIAGNOSIS — I5022 Chronic systolic (congestive) heart failure: Secondary | ICD-10-CM | POA: Diagnosis not present

## 2019-07-12 LAB — CUP PACEART REMOTE DEVICE CHECK
Battery Remaining Longevity: 61 mo
Battery Remaining Percentage: 93 %
Battery Voltage: 2.98 V
Brady Statistic AP VP Percent: 10 %
Brady Statistic AP VS Percent: 85 %
Brady Statistic AS VP Percent: 1 %
Brady Statistic AS VS Percent: 2 %
Brady Statistic RA Percent Paced: 91 %
Brady Statistic RV Percent Paced: 10 %
Date Time Interrogation Session: 20210611030152
HighPow Impedance: 70 Ohm
Implantable Lead Implant Date: 20210312
Implantable Lead Implant Date: 20210312
Implantable Lead Location: 753859
Implantable Lead Location: 753860
Implantable Pulse Generator Implant Date: 20210312
Lead Channel Impedance Value: 440 Ohm
Lead Channel Impedance Value: 490 Ohm
Lead Channel Pacing Threshold Amplitude: 0.5 V
Lead Channel Pacing Threshold Amplitude: 1 V
Lead Channel Pacing Threshold Pulse Width: 0.5 ms
Lead Channel Pacing Threshold Pulse Width: 0.5 ms
Lead Channel Sensing Intrinsic Amplitude: 12 mV
Lead Channel Sensing Intrinsic Amplitude: 3.4 mV
Lead Channel Setting Pacing Amplitude: 3.5 V
Lead Channel Setting Pacing Amplitude: 3.5 V
Lead Channel Setting Pacing Pulse Width: 0.5 ms
Lead Channel Setting Sensing Sensitivity: 0.5 mV
Pulse Gen Serial Number: 111016652

## 2019-07-12 NOTE — Telephone Encounter (Signed)
Spoke with pt.  Advised that the ICD can pace the heart if needed based on programmed settings.  Pt indicated he feels fatigued and like his heart is going faster than it needs to be.  Pt scheduled for in -clinic check on 6/15.  Will discuss with MD at that time.

## 2019-07-12 NOTE — Telephone Encounter (Signed)
I'll do 30d supply. He's overdue for o/v.

## 2019-07-12 NOTE — Telephone Encounter (Signed)
I let the pt know that his monitor is programmed to send automatically. I apologized for the misunderstanding because he thought he had an in office appointment. He asked me did his ICD control his heart rate? I let him speak with the device nurse Amy, rn.

## 2019-07-12 NOTE — Progress Notes (Signed)
Remote ICD transmission.   

## 2019-07-12 NOTE — Telephone Encounter (Signed)
LM for patient to CB to schedule next f/u appt.

## 2019-07-16 ENCOUNTER — Other Ambulatory Visit: Payer: Self-pay

## 2019-07-16 ENCOUNTER — Ambulatory Visit: Payer: Medicare Other | Admitting: Internal Medicine

## 2019-07-16 ENCOUNTER — Encounter: Payer: Self-pay | Admitting: Internal Medicine

## 2019-07-16 VITALS — BP 156/90 | HR 75 | Ht 71.0 in | Wt 160.0 lb

## 2019-07-16 DIAGNOSIS — I255 Ischemic cardiomyopathy: Secondary | ICD-10-CM

## 2019-07-16 DIAGNOSIS — Z95810 Presence of automatic (implantable) cardiac defibrillator: Secondary | ICD-10-CM | POA: Insufficient documentation

## 2019-07-16 DIAGNOSIS — I5022 Chronic systolic (congestive) heart failure: Secondary | ICD-10-CM | POA: Diagnosis not present

## 2019-07-16 MED ORDER — BENAZEPRIL HCL 20 MG PO TABS: 20.0000 mg | ORAL_TABLET | Freq: Every day | ORAL | 3 refills | Status: DC

## 2019-07-16 NOTE — Patient Instructions (Addendum)
Medication Instructions:  Your physician has recommended you make the following change in your medication:  1.  Reduce your benazepril 40 mg-  Take 1/2 tablet (20 mg) by mouth daily.  Your NEW prescription will be for 20 mg tablets   Labwork: None ordered.  Testing/Procedures: None ordered.  Follow-Up: Your physician wants you to follow-up in: one year with Dr. Lovena Le.   You will receive a reminder letter in the mail two months in advance. If you don't receive a letter, please call our office to schedule the follow-up appointment.  Remote monitoring is used to monitor your ICD from home. This monitoring reduces the number of office visits required to check your device to one time per year. It allows [place] to keep an eye on the functioning of your device to ensure it is working properly. You are scheduled for a device check from home on 10/11/2019. You may send your transmission at any time that day. If you have a wireless device, the transmission will be sent automatically. After your physician reviews your transmission, you will receive a postcard with your next transmission date.  Any Other Special Instructions Will Be Listed Below (If Applicable).  If you need a refill on your cardiac medications before your next appointment, please call your pharmacy.

## 2019-07-16 NOTE — Progress Notes (Signed)
HPI [name] returns today for followup. He is a pleasant 74 yo man with a h/o CAD, s/p MI, Chronic systolic heart failure, EF 20%, who underwent ICD insertion 3 months ago. He had a lot of PVC"s after his procedure which could be supressed by pacing at 75/min. He feels ok at rest but notes that he gets tired quickly when he walks. No ICD therapies and no palpitations. He had been on amiodarone previously but is not now. He has been taken off coreg. He has been unable to take entresto due to a rash.  Allergies  Allergen Reactions  . Entresto [Sacubitril-Valsartan] Hives and Shortness Of Breath  . Amlodipine Swelling    LE swelling  . Contrast Media [Iodinated Diagnostic Agents] Other (See Comments)    Prostate problem had to wear a catheter for two weeks after having dye.   [name] Hydralazine Palpitations and Other (See Comments)    Fatigue+     Current Outpatient Medications  Medication Sig Dispense Refill  . ALPRAZolam (XANAX) 0.25 MG tablet TAKE ONE TABLET BY MOUTH AT BEDTIME AS NEEDED 30 tablet 0  . aspirin EC 81 MG tablet Take 81 mg by mouth daily.    . furosemide (LASIX) 20 MG tablet Take 20mg  (1 tablet) as needed for swelling. 30 tablet 3  . loratadine (CLARITIN) 10 MG tablet Take 10 mg by mouth daily as needed for allergies.     . Omega-3 Fatty Acids (FISH OIL) 1000 MG CAPS Take 1,000 mg by mouth daily.     Vladimir Faster Glycol-Propyl Glycol (LUBRICANT EYE DROPS) 0.4-0.3 % SOLN Place 1 drop into both eyes 3 (three) times daily as needed (dry/irritated eyes.).    [name] spironolactone (ALDACTONE) 25 MG tablet Take 1 tablet (25 mg total) by mouth daily. 90 tablet 3  . Vitamin D, Cholecalciferol, 400 units CAPS Take 400 Units by mouth daily.     . benazepril (LOTENSIN) 20 MG tablet Take 1 tablet (20 mg total) by mouth daily. 90 tablet 3   No current facility-administered medications for this visit.     Past Medical History:  Diagnosis Date  . Anxiety   . Atrial fibrillation ([place])     Limited episode, emergency room, June, 2013, spontaneous conversion to sinus rhythm, was evaluated By [name] 2013- no further visits.  Post-op (CABG) a-fib, converted on amio but pt self d/c'd this med due to DOE/side effect profile.  [name] CAD (coronary artery disease) 05/2016   a. 05/2016: NSTEMI with cath showing 3-vessel disease. Underwent CABG on 05/12/16 with LIMA-D1, SVG-LAD, and SVG-PDA.   [name] COPD (chronic obstructive pulmonary disease) ([place]) fall 2016   Bullous changes noted on lower lung images of CT abd done by urology  . Diverticulitis   . GERD (gastroesophageal reflux disease)   . Has received pneumococcal vaccination   . Hyperlipidemia    statin intolerant  . Hypertension   . Ischemic cardiomyopathy 05/2016   EF 20-25% at the time of NSTEMI/CABG.  Repeat EF 07/2016 30-35%.  07/2017 EF 25-30%, pt intol of entresto, bidil, and BB--for ICD 03/2019.  . Microscopic hematuria Fall 2016   CT nl except nonobstructing stones.  Cystoscopy normal 12/30/14 ([name])  . Nephrolithiasis    11 mm stone on R, 2 mm stone on L, ureters clear  . Non-STEMI (non-ST elevated myocardial infarction) ([place]) 05/2016   with impaired LV function  . 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  Urol ([name]) at [facility]; [name] referred him to [name] 03/2015: patient got bilat nerve sparing , robot assisted laparoscopic radical prostatectomy and pelvic lymphadenectomy.  Urol f/u, PSA undetectable on repeat 04/22/16 and 10/28/16; pt chose PSA surveill over adjuv rad tx.   . Tobacco dependence    down to 1-2 cigs per day as of 11/2015.  Restarted 08/2016.  [name] Urinary retention 05/2015   occurred s/p foley removal    ROS:   All systems reviewed and negative except as noted in the HPI.   Past Surgical History:  Procedure Laterality Date  . aortic ultrasound  07/2012   NO AAA  . COLONOSCOPY  approx 2011 per pt   Done by surgeon in [place] after a bout of  diverticulitis; normal per pt report--was told to repeat in 10 yrs.  . CORONARY ARTERY BYPASS GRAFT N/A 05/12/2016   Procedure: CORONARY ARTERY BYPASS GRAFTING (CABG) TIMES THREE USING LEFT INTERNAL MAMMARY ARTERY AND RIGHT SAPHENOUS LEG VEIN HARVESTED ENDOSCOPICALLY;  Surgeon: [name], MD;  Location: [place];  Service: Open Heart Surgery;  Laterality: N/A;  . ICD IMPLANT N/A 04/12/2019   Procedure: ICD IMPLANT;  Surgeon: Evans Lance, MD;  Location: [place] [facility];  Service: Cardiovascular;  Laterality: N/A;  . INGUINAL HERNIA REPAIR  2003 and 2004   Both sides.  [name] LAPAROSCOPY  2017  . LEFT HEART CATH AND CORONARY ANGIOGRAPHY N/A 05/09/2016   Procedure: Left Heart Cath and Coronary Angiography;  Surgeon: [name], MD;  Location: [place] [facility];  Service: Cardiovascular;  Laterality: N/A;  . LITHOTRIPSY  2004   Dr. Maryland Pink in [place].  [name] LYMPHADENECTOMY Bilateral 04/30/2015   Procedure: PELVIC LYMPHADENECTOMY;  Surgeon: [name], MD;  Location: [facility];  Service: Urology;  Laterality: Bilateral;  . PROSTATE BIOPSY  02/05/15   Prostate adenocarcinoma: pt considering treatment options as of 02/19/15  . ROBOT ASSISTED LAPAROSCOPIC RADICAL PROSTATECTOMY N/A 04/30/2015   Procedure: XI ROBOTIC ASSISTED LAPAROSCOPIC RADICAL PROSTATECTOMY LEVEL 2;  Surgeon: [name], MD;  Location: [facility];  Service: Urology;  Laterality: N/A;  . TEE WITHOUT CARDIOVERSION N/A 05/12/2016   Procedure: TRANSESOPHAGEAL ECHOCARDIOGRAM (TEE);  Surgeon: [name], MD;  Location: [place];  Service: Open Heart Surgery;  Laterality: N/A;  . TRANSTHORACIC ECHOCARDIOGRAM  03/22/2006; 05/2016; 07/2016; 07/2017   2008: EF 65%, normal valves, no wall motion abnormalties, LA size normal.  05/2016 in context of non-STEMI, EF 20-25%, mild AV and MV regurg.  08/05/16: EF 30-35%, akinesis of mid lateral myoc, grd II DD, mild aortic 07/2017: EF 25-30%, mult areas of akinesis, grd I DD.     Family History    Problem Relation Age of Onset  . Hypertension Mother   . Cancer - Other Mother 69       breast  . Cancer Father        Lung ca age 74  . Diabetes Sister      Social History   Socioeconomic History  . Marital status: Married    Spouse name: Not on file  . Number of children: Not on file  . Years of education: Not on file  . Highest education level: Not on file  Occupational History  . Not on file  Tobacco Use  . Smoking status: Former Smoker    Packs/day: 1.00    Years: 55.00    Pack years: 55.00    Types: Cigarettes  . Smokeless tobacco:  Never Used  . Tobacco comment: STOPPED AT SURGERY  Vaping Use  . Vaping Use: Never used  Substance and Sexual Activity  . Alcohol use: No  . Drug use: No  . Sexual activity: Not on file  Other Topics Concern  . Not on file  Social History Narrative   Married, 2 grown children.   HS education.  Orig from [place], lives there now.   Occupation: maintenance.  Drives a tractor a lot, drives a pickup truck a lot, rides horses a lot.   Tobacco: 20 pack-yr hx (current as of 07/2012)   No drugs.   Alcohol: none   Social Determinants of Radio broadcast assistant Strain:   . Difficulty of Paying Living Expenses:   Food Insecurity:   . Worried About Charity fundraiser in the Last Year:   . Arboriculturist in the Last Year:   Transportation Needs:   . Film/video editor (Medical):   [name] Lack of Transportation (Non-Medical):   Physical Activity:   . Days of Exercise per Week:   . Minutes of Exercise per Session:   Stress:   . Feeling of Stress :   Social Connections:   . Frequency of Communication with Friends and Family:   . Frequency of Social Gatherings with Friends and Family:   . Attends Religious Services:   . Active Member of Clubs or Organizations:   . Attends Archivist Meetings:   [name] Marital Status:   Intimate Partner Violence:   . Fear of Current or Ex-Partner:   . Emotionally Abused:   [name] Physically  Abused:   . Sexually Abused:      BP (!) 156/90   Pulse 75   Ht 5\' 11"  (1.803 m)   Wt 160 lb (72.6 kg)   SpO2 97%   BMI 22.32 kg/m   Physical Exam:  Well appearing NAD HEENT: Unremarkable Neck:  No JVD, no thyromegally Lymphatics:  No adenopathy Back:  No CVA tenderness Lungs:  Clear HEART:  Regular rate rhythm, no murmurs, no rubs, no clicks Abd:  soft, positive bowel sounds, no organomegally, no rebound, no guarding Ext:  2 plus pulses, no edema, no cyanosis, no clubbing Skin:  No rashes no nodules Neuro:  CN II through XII intact, motor grossly intact  EKG - NSR with atrial pacing at 75/min  DEVICE  Normal device function.  See PaceArt for details.   Assess/Plan: 1. Chronic systolic heart faiure - his symptoms are those of low output. I asked him to reduce his dose of benazapril today. He will followup with Dr. Lenore Cordia. Might consider restarting low dose coreg.  2. CAD - he denies anginal symptoms. He is s/p CABG. 3. PVC's - we turned down the pacing rate to 70. Hopefully the PVC's will be quiet.  4. ICD - his St. Jude DDD ICD is working normally. Almost 8 years of battery longevity.  Mikle Bosworth.D.

## 2019-07-16 NOTE — Telephone Encounter (Signed)
LM for pt to return call, overdue for appt. Advised someone could assist scheduling once calls back.

## 2019-08-07 LAB — CUP PACEART INCLINIC DEVICE CHECK
Battery Remaining Longevity: 93 mo
Brady Statistic RA Percent Paced: 91 %
Brady Statistic RV Percent Paced: 9.9 %
Date Time Interrogation Session: 20210615163822
HighPow Impedance: 73.125
Implantable Lead Implant Date: 20210312
Implantable Lead Implant Date: 20210312
Implantable Lead Location: 753859
Implantable Lead Location: 753860
Implantable Pulse Generator Implant Date: 20210312
Lead Channel Impedance Value: 437.5 Ohm
Lead Channel Impedance Value: 512.5 Ohm
Lead Channel Pacing Threshold Amplitude: 0.375 V
Lead Channel Pacing Threshold Amplitude: 0.75 V
Lead Channel Pacing Threshold Pulse Width: 0.5 ms
Lead Channel Pacing Threshold Pulse Width: 0.5 ms
Lead Channel Sensing Intrinsic Amplitude: 12 mV
Lead Channel Sensing Intrinsic Amplitude: 2.8 mV
Lead Channel Setting Pacing Amplitude: 1 V
Lead Channel Setting Pacing Amplitude: 1.375
Lead Channel Setting Pacing Pulse Width: 0.5 ms
Lead Channel Setting Sensing Sensitivity: 0.5 mV
Pulse Gen Serial Number: 111016652

## 2019-08-08 ENCOUNTER — Ambulatory Visit (INDEPENDENT_AMBULATORY_CARE_PROVIDER_SITE_OTHER): Payer: Medicare Other | Admitting: Family Medicine

## 2019-08-08 ENCOUNTER — Encounter: Payer: Self-pay | Admitting: Family Medicine

## 2019-08-08 ENCOUNTER — Other Ambulatory Visit: Payer: Self-pay

## 2019-08-08 VITALS — BP 174/84 | HR 71 | Temp 97.9°F | Resp 16 | Ht 70.0 in | Wt 167.0 lb

## 2019-08-08 DIAGNOSIS — F411 Generalized anxiety disorder: Secondary | ICD-10-CM | POA: Diagnosis not present

## 2019-08-08 DIAGNOSIS — Z1211 Encounter for screening for malignant neoplasm of colon: Secondary | ICD-10-CM

## 2019-08-08 DIAGNOSIS — I10 Essential (primary) hypertension: Secondary | ICD-10-CM

## 2019-08-08 DIAGNOSIS — Z0001 Encounter for general adult medical examination with abnormal findings: Secondary | ICD-10-CM | POA: Diagnosis not present

## 2019-08-08 DIAGNOSIS — I255 Ischemic cardiomyopathy: Secondary | ICD-10-CM

## 2019-08-08 DIAGNOSIS — Z0000 Encounter for general adult medical examination without abnormal findings: Secondary | ICD-10-CM

## 2019-08-08 LAB — LIPID PANEL
Cholesterol: 154 mg/dL (ref 0–200)
HDL: 39.3 mg/dL (ref >39.00)
LDL Cholesterol: 105 mg/dL — ABNORMAL HIGH (ref 0–99)
NonHDL: 114.71
Total CHOL/HDL Ratio: 4
Triglycerides: 51 mg/dL (ref 0.0–149.0)
VLDL: 10.2 mg/dL (ref 0.0–40.0)

## 2019-08-08 LAB — COMPREHENSIVE METABOLIC PANEL
ALT: 12 U/L (ref 0–53)
AST: 19 U/L (ref 0–37)
Albumin: 4.3 g/dL (ref 3.5–5.2)
Alkaline Phosphatase: 64 U/L (ref 39–117)
BUN: 15 mg/dL (ref 6–23)
CO2: 28 mEq/L (ref 19–32)
Calcium: 9.5 mg/dL (ref 8.4–10.5)
Chloride: 104 mEq/L (ref 96–112)
Creatinine, Ser: 0.96 mg/dL (ref 0.40–1.50)
GFR: 76.51 mL/min (ref >60.00)
Glucose, Bld: 80 mg/dL (ref 70–99)
Potassium: 4.4 mEq/L (ref 3.5–5.1)
Sodium: 137 mEq/L (ref 135–145)
Total Bilirubin: 0.9 mg/dL (ref 0.2–1.2)
Total Protein: 6.2 g/dL (ref 6.0–8.3)

## 2019-08-08 LAB — CBC WITH DIFFERENTIAL/PLATELET
Basophils Absolute: 0 10*3/uL (ref 0.0–0.1)
Basophils Relative: 0.8 % (ref 0.0–3.0)
Eosinophils Absolute: 0.2 10*3/uL (ref 0.0–0.7)
Eosinophils Relative: 3.3 % (ref 0.0–5.0)
HCT: 45.2 % (ref 39.0–52.0)
Hemoglobin: 15.1 g/dL (ref 13.0–17.0)
Lymphocytes Relative: 24.5 % (ref 12.0–46.0)
Lymphs Abs: 1.2 10*3/uL (ref 0.7–4.0)
MCHC: 33.5 g/dL (ref 30.0–36.0)
MCV: 93.4 fl (ref 78.0–100.0)
Monocytes Absolute: 0.5 10*3/uL (ref 0.1–1.0)
Monocytes Relative: 9.5 % (ref 3.0–12.0)
Neutro Abs: 3 10*3/uL (ref 1.4–7.7)
Neutrophils Relative %: 61.9 % (ref 43.0–77.0)
Platelets: 132 10*3/uL — ABNORMAL LOW (ref 150.0–400.0)
RBC: 4.84 Mil/uL (ref 4.22–5.81)
RDW: 14.7 % (ref 11.5–15.5)
WBC: 4.9 10*3/uL (ref 4.0–10.5)

## 2019-08-08 MED ORDER — BENAZEPRIL HCL 20 MG PO TABS: ORAL_TABLET | ORAL | 3 refills | Status: DC

## 2019-08-08 NOTE — Patient Instructions (Signed)

## 2019-08-08 NOTE — Progress Notes (Signed)
Office Note 08/08/2019  CC:  Chief Complaint  Patient presents with  . Annual Exam    pt is fasting    HPI:  Daniel [name]. is a 74 y.o. White male who is here for annual health maintenance exam. Feeling good, esp since pacer.  Electrophys f/u 07/16/19 a little pacer adjustment was made.   BP up to 170/90 range at times in mornings before taking meds, later in day 150-160/80-85 typically. +hx smoking cigs, 30 pack-yr hx, quit 2018. No CP or SOB. Takes lasix prn: typically 1 time per month. Alprazolam use is prn, approx 4 times per month.  PMP AWARE reviewed today: most recent rx for alprazolam 0.25mg  was filled 07/12/19, # 30, rx by me. No red flags.    Past Medical History:  Diagnosis Date  . Anxiety   . Atrial fibrillation ([place])    Limited episode, emergency room, June, 2013, spontaneous conversion to sinus rhythm, was evaluated By [name] 2013- no further visits.  Post-op (CABG) a-fib, converted on amio but pt self d/c'd this med due to DOE/side effect profile.  [name] CAD (coronary artery disease) 05/2016   a. 05/2016: NSTEMI with cath showing 3-vessel disease. Underwent CABG on 05/12/16 with LIMA-D1, SVG-LAD, and SVG-PDA.   [name] COPD (chronic obstructive pulmonary disease) ([place]) fall 2016   Bullous changes noted on lower lung images of CT abd done by urology  . Diverticulitis   . GERD (gastroesophageal reflux disease)   . Has received pneumococcal vaccination   . Hyperlipidemia    statin intolerant  . Hypertension   . Ischemic cardiomyopathy 05/2016   EF 20-25% at the time of NSTEMI/CABG.  Repeat EF 07/2016 30-35%.  07/2017 EF 25-30%, pt intol of entresto, bidil, and BB--for ICD 03/2019.  . Microscopic hematuria Fall 2016   CT nl except nonobstructing stones.  Cystoscopy normal 12/30/14 ([name])  . Nephrolithiasis    11 mm stone on R, 2 mm stone on L, ureters clear  . Non-STEMI (non-ST elevated myocardial infarction) ([place]) 05/2016   with impaired LV function  .  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  Urol ([name]) at [facility]; [name] referred him to [name] 03/2015: patient got bilat nerve sparing , robot assisted laparoscopic radical prostatectomy and pelvic lymphadenectomy.  Urol f/u, PSA undetectable on repeat 04/22/16 and 10/28/16; pt chose PSA surveill over adjuv rad tx.   . Tobacco dependence    down to 1-2 cigs per day as of 11/2015.  Restarted 08/2016.  [name] Urinary retention 05/2015   occurred s/p foley removal    Past Surgical History:  Procedure Laterality Date  . aortic ultrasound  07/2012   NO AAA  . COLONOSCOPY  approx 2011 per pt   Done by surgeon in [place] after a bout of diverticulitis; normal per pt report--was told to repeat in 10 yrs.  . CORONARY ARTERY BYPASS GRAFT N/A 05/12/2016   Procedure: CORONARY ARTERY BYPASS GRAFTING (CABG) TIMES THREE USING LEFT INTERNAL MAMMARY ARTERY AND RIGHT SAPHENOUS LEG VEIN HARVESTED ENDOSCOPICALLY;  Surgeon: [name], MD;  Location: [place];  Service: Open Heart Surgery;  Laterality: N/A;  . ICD IMPLANT N/A 04/12/2019   Procedure: ICD IMPLANT;  Surgeon: Evans Lance, MD;  Location: [place] [facility];  Service: Cardiovascular;  Laterality: N/A;  . INGUINAL HERNIA REPAIR  2003 and 2004   Both sides.  [name] LAPAROSCOPY  2017  . LEFT HEART  CATH AND CORONARY ANGIOGRAPHY N/A 05/09/2016   Procedure: Left Heart Cath and Coronary Angiography;  Surgeon: [name], MD;  Location: [place] [facility];  Service: Cardiovascular;  Laterality: N/A;  . LITHOTRIPSY  2004   Dr. [place] Pink in [place].  [name] LYMPHADENECTOMY Bilateral 04/30/2015   Procedure: PELVIC LYMPHADENECTOMY;  Surgeon: [name], MD;  Location: [facility];  Service: Urology;  Laterality: Bilateral;  . PROSTATE BIOPSY  02/05/15   Prostate adenocarcinoma: pt considering treatment options as of 02/19/15  . ROBOT ASSISTED LAPAROSCOPIC RADICAL PROSTATECTOMY N/A 04/30/2015   Procedure: XI ROBOTIC  ASSISTED LAPAROSCOPIC RADICAL PROSTATECTOMY LEVEL 2;  Surgeon: [name], MD;  Location: [facility];  Service: Urology;  Laterality: N/A;  . TEE WITHOUT CARDIOVERSION N/A 05/12/2016   Procedure: TRANSESOPHAGEAL ECHOCARDIOGRAM (TEE);  Surgeon: [name], MD;  Location: [place];  Service: Open Heart Surgery;  Laterality: N/A;  . TRANSTHORACIC ECHOCARDIOGRAM  03/22/2006; 05/2016; 07/2016; 07/2017   2008: EF 65%, normal valves, no wall motion abnormalties, LA size normal.  05/2016 in context of non-STEMI, EF 20-25%, mild AV and MV regurg.  08/05/16: EF 30-35%, akinesis of mid lateral myoc, grd II DD, mild aortic 07/2017: EF 25-30%, mult areas of akinesis, grd I DD.    Family History  Problem Relation Age of Onset  . Hypertension Mother   . Cancer - Other Mother 27       breast  . Cancer Father        Lung ca age 6  . Diabetes Sister     Social History   Socioeconomic History  . Marital status: Married    Spouse name: Not on file  . Number of children: Not on file  . Years of education: Not on file  . Highest education level: Not on file  Occupational History  . Not on file  Tobacco Use  . Smoking status: Former Smoker    Packs/day: 1.00    Years: 55.00    Pack years: 55.00    Types: Cigarettes  . Smokeless tobacco: Never Used  . Tobacco comment: STOPPED AT SURGERY  Vaping Use  . Vaping Use: Never used  Substance and Sexual Activity  . Alcohol use: No  . Drug use: No  . Sexual activity: Not on file  Other Topics Concern  . Not on file  Social History Narrative   Married, 2 grown children.   HS education.  Orig from [place], lives there now.   Occupation: maintenance.  Drives a tractor a lot, drives a pickup truck a lot, rides horses a lot.   Tobacco: 20 pack-yr hx (current as of 07/2012)   No drugs.   Alcohol: none   Social Determinants of Radio broadcast assistant Strain:   . Difficulty of Paying Living Expenses:   Food Insecurity:   . Worried About Paediatric nurse in the Last Year:   . Arboriculturist in the Last Year:   Transportation Needs:   . Film/video editor (Medical):   [name] Lack of Transportation (Non-Medical):   Physical Activity:   . Days of Exercise per Week:   . Minutes of Exercise per Session:   Stress:   . Feeling of Stress :   Social Connections:   . Frequency of Communication with Friends and Family:   . Frequency of Social Gatherings with Friends and Family:   . Attends Religious Services:   . Active Member of Clubs or Organizations:   . Attends Club  or Organization Meetings:   [name] Marital Status:   Intimate Partner Violence:   . Fear of Current or Ex-Partner:   . Emotionally Abused:   [name] Physically Abused:   . Sexually Abused:     Outpatient Medications Prior to Visit  Medication Sig Dispense Refill  . ALPRAZolam (XANAX) 0.25 MG tablet TAKE ONE TABLET BY MOUTH AT BEDTIME AS NEEDED 30 tablet 0  . aspirin EC 81 MG tablet Take 81 mg by mouth daily.    . diphenhydrAMINE HCl (BENADRYL ALLERGY PO) Take by mouth as needed.    . furosemide (LASIX) 20 MG tablet Take 20mg  (1 tablet) as needed for swelling. 30 tablet 3  . loratadine (CLARITIN) 10 MG tablet Take 10 mg by mouth daily as needed for allergies.     . Omega-3 Fatty Acids (FISH OIL) 1000 MG CAPS Take 1,000 mg by mouth daily.     Vladimir Faster Glycol-Propyl Glycol (LUBRICANT EYE DROPS) 0.4-0.3 % SOLN Place 1 drop into both eyes 3 (three) times daily as needed (dry/irritated eyes.).    [name] spironolactone (ALDACTONE) 25 MG tablet Take 1 tablet (25 mg total) by mouth daily. 90 tablet 3  . Vitamin D, Cholecalciferol, 400 units CAPS Take 400 Units by mouth daily.     . benazepril (LOTENSIN) 20 MG tablet Take 1 tablet (20 mg total) by mouth daily. 90 tablet 3   No facility-administered medications prior to visit.    Allergies  Allergen Reactions  . Entresto [Sacubitril-Valsartan] Hives and Shortness Of Breath  . Amlodipine Swelling    LE swelling  . Contrast Media  [Iodinated Diagnostic Agents] Other (See Comments)    Prostate problem had to wear a catheter for two weeks after having dye.   [name] Hydralazine Palpitations and Other (See Comments)    Fatigue+    ROS Review of Systems  Constitutional: Negative for appetite change, chills, fatigue and fever.  HENT: Negative for congestion, dental problem, ear pain and sore throat.   Eyes: Negative for discharge, redness and visual disturbance.  Respiratory: Negative for cough, chest tightness, shortness of breath and wheezing.   Cardiovascular: Negative for chest pain, palpitations and leg swelling.  Gastrointestinal: Negative for abdominal pain, blood in stool, diarrhea, nausea and vomiting.  Genitourinary: Negative for difficulty urinating, dysuria, flank pain, frequency, hematuria and urgency.  Musculoskeletal: Negative for arthralgias, back pain, joint swelling, myalgias and neck stiffness.  Skin: Negative for pallor and rash.  Neurological: Negative for dizziness, speech difficulty, weakness and headaches.  Hematological: Negative for adenopathy. Does not bruise/bleed easily.  Psychiatric/Behavioral: Negative for confusion and sleep disturbance. The patient is not nervous/anxious.     PE; Vitals with BMI 08/08/2019 07/16/2019 04/12/2019  Height 5\' 10"  5\' 11"  -  Weight 167 lbs 160 lbs -  BMI 74.12 87.86 -  Systolic 767 209 470  Diastolic 84 90 962  Pulse 71 75 79  O2 sat on RA today is 100% Repeat manual bp at end of visit today was 134/80.  Gen: Alert, well appearing.  Patient is oriented to person, place, time, and situation. AFFECT: pleasant, lucid thought and speech. ENT: Ears: EACs clear, normal epithelium.  TMs with good light reflex and landmarks bilaterally.  Eyes: no injection, icteris, swelling, or exudate.  EOMI, PERRLA. Nose: no drainage or turbinate edema/swelling.  No injection or focal lesion.  Mouth: lips without lesion/swelling.  Oral mucosa pink and moist.  Dentition intact and  without obvious caries or gingival swelling.  Oropharynx without erythema, exudate, or  swelling.  Neck: supple/nontender.  No LAD, mass, or TM.  Carotid pulses 2+ bilaterally, without bruits. CV: RRR, no m/r/g.   LUNGS: CTA bilat, nonlabored resps, good aeration in all lung fields. ABD: soft, NT, ND, BS normal.  No hepatospenomegaly or mass.  No bruits.  Palpable abd aortic pulsations ([place] in past NEG for AAA). EXT: no clubbing or cyanosis.  Trace bilat LL pitting edema, R>L. Musculoskeletal: no joint swelling, erythema, warmth, or tenderness.  ROM of all joints intact. Skin - no sores or suspicious lesions or rashes or color changes   Pertinent labs:  Lab Results  Component Value Date   TSH 0.980 02/26/2019   Lab Results  Component Value Date   WBC 5.0 04/09/2019   HGB 16.1 04/09/2019   HCT 49.3 04/09/2019   MCV 96.9 04/09/2019   PLT 168 04/09/2019   Lab Results  Component Value Date   CREATININE 1.11 04/09/2019   BUN 14 04/09/2019   NA 139 04/09/2019   K 4.0 04/09/2019   CL 102 04/09/2019   CO2 27 04/09/2019   Lab Results  Component Value Date   ALT 14 07/27/2018   AST 20 07/27/2018   ALKPHOS 60 07/27/2018   BILITOT 1.1 07/27/2018   Lab Results  Component Value Date   CHOL 144 07/27/2018   Lab Results  Component Value Date   HDL 31.50 (L) 07/27/2018   Lab Results  Component Value Date   LDLCALC 102 (H) 07/27/2018   Lab Results  Component Value Date   TRIG 53.0 07/27/2018   Lab Results  Component Value Date   CHOLHDL 5 07/27/2018   Lab Results  Component Value Date   PSA 0.028 11/02/2018   PSA 0.019 04/27/2018   PSA <0.02 10/15/2015    ASSESSMENT AND PLAN:   Health maintenance exam: Reviewed age and gender appropriate health maintenance issues (prudent diet, regular exercise, health risks of tobacco and excessive alcohol, use of seatbelts, fire alarms in home, use of sunscreen).  Also reviewed age and gender appropriate health screening as well as  vaccine recommendations. Vaccines: Tdap due->pt declined.  Not covid vaccinated, says he is still considering.  I strongly encouraged him to get this vaccine ASAP. Labs: CBC, CMET, FLP. Prostate ca screening: hx of prostate ca, surgery, etc-->followed by urology. Colon ca screening: due for repeat screening-->pt reports colonoscopy 2011 normal (pt cannot recall provider's name)-->pt prefers to arrange cologuard with his insurer. Lung ca screening: he qualifies/fits criteria for this BUT he declined.  HTN, ischemic CM: he'll be making f/u appt with his cardiologist for later this month. In the meantime I want to increase his lotensin back to 20 mg twice a day instead of just 20mg  qd.  Check BMET 1 wk.  Hx of symptomatic bradycardia, particularly when on BB: now feeling much improved since getting pacer.  Anxiety/anxiety-induced insomnia: rarely uses alpraz and it is helpful. CSC renewed today.  An After Visit Summary was printed and given to the patient.  FOLLOW UP:  Return in about 6 months (around 02/08/2020) for routine chronic illness f/u; lab visit 1 week (non-fasting)-BMET.  Signed:  Crissie Sickles, MD           08/08/2019

## 2019-08-11 ENCOUNTER — Encounter: Payer: Self-pay | Admitting: Family Medicine

## 2019-08-13 ENCOUNTER — Ambulatory Visit (INDEPENDENT_AMBULATORY_CARE_PROVIDER_SITE_OTHER): Payer: Medicare Other | Admitting: Family Medicine

## 2019-08-13 ENCOUNTER — Other Ambulatory Visit: Payer: Self-pay

## 2019-08-13 DIAGNOSIS — I10 Essential (primary) hypertension: Secondary | ICD-10-CM

## 2019-08-13 LAB — BASIC METABOLIC PANEL
BUN: 16 mg/dL (ref 6–23)
CO2: 28 mEq/L (ref 19–32)
Calcium: 9.4 mg/dL (ref 8.4–10.5)
Chloride: 103 mEq/L (ref 96–112)
Creatinine, Ser: 0.95 mg/dL (ref 0.40–1.50)
GFR: 77.44 mL/min (ref >60.00)
Glucose, Bld: 140 mg/dL — ABNORMAL HIGH (ref 70–99)
Potassium: 4 mEq/L (ref 3.5–5.1)
Sodium: 138 mEq/L (ref 135–145)

## 2019-08-15 ENCOUNTER — Other Ambulatory Visit: Payer: Self-pay

## 2019-08-15 DIAGNOSIS — R739 Hyperglycemia, unspecified: Secondary | ICD-10-CM

## 2019-08-21 ENCOUNTER — Ambulatory Visit (INDEPENDENT_AMBULATORY_CARE_PROVIDER_SITE_OTHER): Payer: Medicare Other | Admitting: Family Medicine

## 2019-08-21 ENCOUNTER — Other Ambulatory Visit: Payer: Self-pay

## 2019-08-21 DIAGNOSIS — R739 Hyperglycemia, unspecified: Secondary | ICD-10-CM

## 2019-08-21 LAB — GLUCOSE, RANDOM: Glucose, Bld: 91 mg/dL (ref 70–99)

## 2019-08-22 LAB — HEMOGLOBIN A1C: Hgb A1c MFr Bld: 5.5 % (ref 4.6–6.5)

## 2019-09-25 ENCOUNTER — Other Ambulatory Visit: Payer: Self-pay | Admitting: Family Medicine

## 2019-09-25 NOTE — Telephone Encounter (Signed)
Pt requesting refill of Xanax. Last office visit was 08/08/19. Last medication refill was 07/12/2019

## 2019-09-25 NOTE — Telephone Encounter (Signed)
Left detailed message advising refill sent, okay per dpr. 

## 2019-10-11 ENCOUNTER — Ambulatory Visit (INDEPENDENT_AMBULATORY_CARE_PROVIDER_SITE_OTHER): Payer: Medicare Other | Admitting: *Deleted

## 2019-10-11 DIAGNOSIS — I255 Ischemic cardiomyopathy: Secondary | ICD-10-CM | POA: Diagnosis not present

## 2019-10-11 LAB — CUP PACEART REMOTE DEVICE CHECK
Battery Remaining Longevity: 90 mo
Battery Remaining Percentage: 90 %
Battery Voltage: 2.98 V
Brady Statistic AP VP Percent: 15 %
Brady Statistic AP VS Percent: 73 %
Brady Statistic AS VP Percent: 1 %
Brady Statistic AS VS Percent: 12 %
Brady Statistic RA Percent Paced: 87 %
Brady Statistic RV Percent Paced: 15 %
Date Time Interrogation Session: 20210910020024
HighPow Impedance: 74 Ohm
Implantable Lead Implant Date: 20210312
Implantable Lead Implant Date: 20210312
Implantable Lead Location: 753859
Implantable Lead Location: 753860
Implantable Pulse Generator Implant Date: 20210312
Lead Channel Impedance Value: 440 Ohm
Lead Channel Impedance Value: 490 Ohm
Lead Channel Pacing Threshold Amplitude: 0.5 V
Lead Channel Pacing Threshold Amplitude: 0.875 V
Lead Channel Pacing Threshold Pulse Width: 0.5 ms
Lead Channel Pacing Threshold Pulse Width: 0.5 ms
Lead Channel Sensing Intrinsic Amplitude: 12 mV
Lead Channel Sensing Intrinsic Amplitude: 2.6 mV
Lead Channel Setting Pacing Amplitude: 1.125
Lead Channel Setting Pacing Amplitude: 1.5 V
Lead Channel Setting Pacing Pulse Width: 0.5 ms
Lead Channel Setting Sensing Sensitivity: 0.5 mV
Pulse Gen Serial Number: 111016652

## 2019-10-14 NOTE — Progress Notes (Signed)
Remote ICD transmission.   

## 2019-12-11 LAB — PSA: PSA: 0.047

## 2020-01-01 ENCOUNTER — Other Ambulatory Visit: Payer: Self-pay

## 2020-01-01 ENCOUNTER — Encounter (HOSPITAL_BASED_OUTPATIENT_CLINIC_OR_DEPARTMENT_OTHER): Payer: Self-pay

## 2020-01-01 ENCOUNTER — Emergency Department (HOSPITAL_BASED_OUTPATIENT_CLINIC_OR_DEPARTMENT_OTHER): Payer: Medicare Other

## 2020-01-01 ENCOUNTER — Observation Stay (HOSPITAL_BASED_OUTPATIENT_CLINIC_OR_DEPARTMENT_OTHER)
Admission: EM | Admit: 2020-01-01 | Discharge: 2020-01-03 | Disposition: A | Discharge status: Home / Self Care | Payer: Medicare Other | Attending: Internal Medicine | Admitting: Internal Medicine

## 2020-01-01 ENCOUNTER — Telehealth: Payer: Self-pay | Admitting: Family Medicine

## 2020-01-01 DIAGNOSIS — Z951 Presence of aortocoronary bypass graft: Secondary | ICD-10-CM | POA: Insufficient documentation

## 2020-01-01 DIAGNOSIS — H532 Diplopia: Principal | ICD-10-CM | POA: Insufficient documentation

## 2020-01-01 DIAGNOSIS — I255 Ischemic cardiomyopathy: Secondary | ICD-10-CM | POA: Diagnosis present

## 2020-01-01 DIAGNOSIS — Z8546 Personal history of malignant neoplasm of prostate: Secondary | ICD-10-CM | POA: Insufficient documentation

## 2020-01-01 DIAGNOSIS — Z95810 Presence of automatic (implantable) cardiac defibrillator: Secondary | ICD-10-CM | POA: Diagnosis present

## 2020-01-01 DIAGNOSIS — Z20822 Contact with and (suspected) exposure to covid-19: Secondary | ICD-10-CM | POA: Diagnosis not present

## 2020-01-01 DIAGNOSIS — Z8673 Personal history of transient ischemic attack (TIA), and cerebral infarction without residual deficits: Secondary | ICD-10-CM

## 2020-01-01 DIAGNOSIS — Z79899 Other long term (current) drug therapy: Secondary | ICD-10-CM | POA: Diagnosis not present

## 2020-01-01 DIAGNOSIS — H538 Other visual disturbances: Secondary | ICD-10-CM | POA: Diagnosis present

## 2020-01-01 DIAGNOSIS — Z7982 Long term (current) use of aspirin: Secondary | ICD-10-CM | POA: Diagnosis not present

## 2020-01-01 DIAGNOSIS — Z87891 Personal history of nicotine dependence: Secondary | ICD-10-CM | POA: Diagnosis not present

## 2020-01-01 DIAGNOSIS — J449 Chronic obstructive pulmonary disease, unspecified: Secondary | ICD-10-CM | POA: Diagnosis not present

## 2020-01-01 DIAGNOSIS — I25810 Atherosclerosis of coronary artery bypass graft(s) without angina pectoris: Secondary | ICD-10-CM | POA: Diagnosis present

## 2020-01-01 DIAGNOSIS — I10 Essential (primary) hypertension: Secondary | ICD-10-CM | POA: Diagnosis present

## 2020-01-01 DIAGNOSIS — I6330 Cerebral infarction due to thrombosis of unspecified cerebral artery: Secondary | ICD-10-CM | POA: Insufficient documentation

## 2020-01-01 DIAGNOSIS — I2510 Atherosclerotic heart disease of native coronary artery without angina pectoris: Secondary | ICD-10-CM | POA: Insufficient documentation

## 2020-01-01 DIAGNOSIS — I639 Cerebral infarction, unspecified: Secondary | ICD-10-CM

## 2020-01-01 DIAGNOSIS — I110 Hypertensive heart disease with heart failure: Secondary | ICD-10-CM | POA: Diagnosis not present

## 2020-01-01 DIAGNOSIS — Z8709 Personal history of other diseases of the respiratory system: Secondary | ICD-10-CM | POA: Diagnosis not present

## 2020-01-01 DIAGNOSIS — C61 Malignant neoplasm of prostate: Secondary | ICD-10-CM | POA: Diagnosis present

## 2020-01-01 DIAGNOSIS — I5022 Chronic systolic (congestive) heart failure: Secondary | ICD-10-CM | POA: Insufficient documentation

## 2020-01-01 HISTORY — DX: Cerebral infarction, unspecified: I63.9

## 2020-01-01 HISTORY — DX: Other visual disturbances: H53.8

## 2020-01-01 HISTORY — DX: Diplopia: H53.2

## 2020-01-01 HISTORY — DX: Personal history of transient ischemic attack (TIA), and cerebral infarction without residual deficits: Z86.73

## 2020-01-01 LAB — COMPREHENSIVE METABOLIC PANEL
ALT: 19 U/L (ref 0–44)
AST: 25 U/L (ref 15–41)
Albumin: 4.4 g/dL (ref 3.5–5.0)
Alkaline Phosphatase: 74 U/L (ref 38–126)
Anion gap: 9 (ref 5–15)
BUN: 17 mg/dL (ref 8–23)
CO2: 24 mmol/L (ref 22–32)
Calcium: 9.7 mg/dL (ref 8.9–10.3)
Chloride: 104 mmol/L (ref 98–111)
Creatinine, Ser: 0.9 mg/dL (ref 0.61–1.24)
GFR, Estimated: >60 mL/min (ref >60)
Glucose, Bld: 151 mg/dL — ABNORMAL HIGH (ref 70–99)
Potassium: 3.9 mmol/L (ref 3.5–5.1)
Sodium: 137 mmol/L (ref 135–145)
Total Bilirubin: 0.9 mg/dL (ref 0.3–1.2)
Total Protein: 7 g/dL (ref 6.5–8.1)

## 2020-01-01 LAB — CBC WITH DIFFERENTIAL/PLATELET
Abs Immature Granulocytes: 0.02 10*3/uL (ref 0.00–0.07)
Basophils Absolute: 0 10*3/uL (ref 0.0–0.1)
Basophils Relative: 1 %
Eosinophils Absolute: 0.1 10*3/uL (ref 0.0–0.5)
Eosinophils Relative: 2 %
HCT: 48.2 % (ref 39.0–52.0)
Hemoglobin: 16.5 g/dL (ref 13.0–17.0)
Immature Granulocytes: 0 %
Lymphocytes Relative: 25 %
Lymphs Abs: 1.3 10*3/uL (ref 0.7–4.0)
MCH: 32.5 pg (ref 26.0–34.0)
MCHC: 34.2 g/dL (ref 30.0–36.0)
MCV: 94.9 fL (ref 80.0–100.0)
Monocytes Absolute: 0.4 10*3/uL (ref 0.1–1.0)
Monocytes Relative: 8 %
Neutro Abs: 3.3 10*3/uL (ref 1.7–7.7)
Neutrophils Relative %: 64 %
Platelets: 160 10*3/uL (ref 150–400)
RBC: 5.08 MIL/uL (ref 4.22–5.81)
RDW: 13.3 % (ref 11.5–15.5)
WBC: 5.2 10*3/uL (ref 4.0–10.5)
nRBC: 0 % (ref 0.0–0.2)

## 2020-01-01 LAB — TSH: TSH: 0.68 u[IU]/mL (ref 0.350–4.500)

## 2020-01-01 NOTE — ED Provider Notes (Signed)
  Physical Exam  BP (!) 175/102 (BP Location: Left Arm)   Pulse 73   Temp 97.8 F (36.6 C) (Oral)   Resp 16   Ht 5\' 10"  (1.778 m)   Wt 73 kg   SpO2 100%   BMI 23.10 kg/m   Physical Exam Vitals and nursing note reviewed.  Constitutional:      General: He is not in acute distress.    Appearance: He is well-developed. He is not diaphoretic.  HENT:     Head: Normocephalic and atraumatic.  Eyes:     General: No scleral icterus.    Conjunctiva/sclera: Conjunctivae normal.     Pupils: Pupils are equal, round, and reactive to light.  Pulmonary:     Effort: Pulmonary effort is normal. No respiratory distress.  Musculoskeletal:     Cervical back: Normal range of motion.  Skin:    Findings: No rash.  Neurological:     General: No focal deficit present.     Mental Status: He is alert and oriented to person, place, and time.     Cranial Nerves: No cranial nerve deficit.     Sensory: No sensory deficit.     Motor: No weakness.     Coordination: Coordination normal.     Comments: Pupils reactive. No facial asymmetry noted. Cranial nerves appear grossly intact. Sensation intact to light touch on face, BUE and BLE. Strength 5/5 in BUE and BLE.  No nystagmus noted.     ED Course/Procedures     Procedures  MDM   Patient transferred from [facility] for MRI.  Patient states that he began having blurry vision in both of his eyes yesterday around 1 PM that gradually persisted and after he saw his ophthalmologist this morning was sent to the ER to rule out posterior circulation stroke.  He continues to have blurry vision but denies any other neurological deficits.  His blurry vision improves when he closes one of his eyes.  CT head without any acute findings.  Patient has an ICD in place and after speaking to the MRI tech, can only get an MRI in the morning as he will need to be monitored by radiology nurse while his ICD is off.  He also has a contrast dye allergy and states that it  caused "my prostate to swell up."  Feel that he will benefit from admission for stroke work-up and for MRI in the morning.  I have consulted neurology for their recommendations and if they feel that CT angio with premedication will be sufficient and ruling out a stroke or other emergent process. Appreciate the help of neurology and hospitalist for management of this patient.   Portions of this note were generated with [employer]. Dictation errors may occur despite best attempts at proofreading.      Delia Heady, PA-C 01/01/20 [id number]    Lucrezia Starch, MD 01/02/20 [phone]

## 2020-01-01 NOTE — Telephone Encounter (Signed)
Patient states after taking his wife to several appts and then the hospital yesterday, this morning he has "double vision." He went to his eye doctor this morning who told him to see his doctor ASAP. I transferred the patient to triage nurse to discuss symptoms and possibility of waiting for next available appt here in 5 days.

## 2020-01-01 NOTE — Discharge Instructions (Signed)
Go to [facility] ED for MRI and MRA   If your MRI doesn't show a stroke, follow up with your eye doctor   Return to ER if you have worse blurry vision, double vision

## 2020-01-01 NOTE — Telephone Encounter (Signed)
FYI. Please see below.  Spoke with patient and confirmed he was seen at the eye doctor this morning. They ran tests and set him up for new glasses. Declined having headache, loss of vision. He states he can see good out of both eyes but only has issues when looking straight. He also denied issues with vision going from sitting to standing or looking from R to L or vice versa. Most recent BP was 146/82. He is taking BP med as prescribed. Advised to continue with daily BP and HR check. He understood if things worsen to proceed to nearest urgent care, ED or contact eye doctor for further care.

## 2020-01-01 NOTE — Telephone Encounter (Signed)
LM for pt to return call to further discuss symptoms

## 2020-01-01 NOTE — ED Notes (Signed)
ED Provider at bedside. 

## 2020-01-01 NOTE — ED Triage Notes (Signed)
Pt here from [facility] for mri , talked with Mri , pt has a pacemaker and they will not be able to do the MRI until tomorrow

## 2020-01-01 NOTE — ED Provider Notes (Signed)
[facility] EMERGENCY DEPARTMENT Provider Note   CSN: [id number] Arrival date & time: 01/01/20  1606     History Chief Complaint  Patient presents with  . Vision changes    Daniel [name]. is a 74 y.o. male hx of afib not on blood thinners, CAD, prostate cancer who presented with double vision.  Patient had acute onset of double vision since yesterday.  Patient states that whenever he opens his eyes he sees double but whenever he closes them he only sees one.  He went to eye doctor had a dilated eye exam and was noted to have 4 degree prism diopter.  Ophthalmology was concerned for possible posterior circulation stroke.  Patient denies any trouble walking or weakness or trouble speaking.  Patient unfortunately has contrast dye allergy.  Patient is not currently on any blood thinners.  The history is provided by the patient.       Past Medical History:  Diagnosis Date  . Anxiety   . Atrial fibrillation ([place])    Limited episode, emergency room, June, 2013, spontaneous conversion to sinus rhythm, was evaluated By [name] 2013- no further visits.  Post-op (CABG) a-fib, converted on amio but pt self d/c'd this med due to DOE/side effect profile.  [name] CAD (coronary artery disease) 05/2016   a. 05/2016: NSTEMI with cath showing 3-vessel disease. Underwent CABG on 05/12/16 with LIMA-D1, SVG-LAD, and SVG-PDA.   [name] COPD (chronic obstructive pulmonary disease) ([place]) fall 2016   Bullous changes noted on lower lung images of CT abd done by urology  . Diverticulitis   . GERD (gastroesophageal reflux disease)   . Has received pneumococcal vaccination   . Hyperlipidemia    statin intolerant. Pt declined advanced lipid clinic referral 08/2019  . Hypertension   . Ischemic cardiomyopathy 05/2016   EF 20-25% at the time of NSTEMI/CABG.  Repeat EF 07/2016 30-35%.  07/2017 EF 25-30%, pt intol of entresto, bidil, and BB--for ICD 03/2019.  . Microscopic hematuria Fall 2016   CT nl except  nonobstructing stones.  Cystoscopy normal 12/30/14 ([name])  . Nephrolithiasis    11 mm stone on R, 2 mm stone on L, ureters clear  . Non-STEMI (non-ST elevated myocardial infarction) ([place]) 05/2016   with impaired LV function  . 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  Urol ([name]) at [facility]; [name] referred him to [name] 03/2015: patient got bilat nerve sparing , robot assisted laparoscopic radical prostatectomy and pelvic lymphadenectomy.  Urol f/u, PSA undetectable on repeat 04/22/16 and 10/28/16; pt chose PSA surveill over adjuv rad tx.   . Tobacco dependence    down to 1-2 cigs per day as of 11/2015.  Restarted 08/2016.  [name] Urinary retention 05/2015   occurred s/p foley removal    Patient Active Problem List   Diagnosis Date Noted  . ICD (implantable cardioverter-defibrillator) in place 07/16/2019  . Coronary artery disease involving native coronary artery of native heart without angina pectoris 09/30/2018  . Chronic systolic HF (heart failure) ([place]) 09/30/2018  . Dyslipidemia 09/30/2018  . Tobacco abuse 09/30/2018  . Hx of CABG 06/27/2018  . Educated about COVID-19 virus infection 05/16/2018  . Bradycardia 06/20/2017  . Coronary artery disease involving coronary bypass graft of native heart without angina pectoris 06/07/2017  . Tobacco abuse counseling 06/07/2017  . Ischemic cardiomyopathy 10/13/2016  . CAD (coronary artery disease) 05/12/2016  . Non-ST elevation (NSTEMI) myocardial infarction ([place])   .  Prostate cancer ([place]) 04/30/2015  . UTI (urinary tract infection) 11/14/2014  . Hyperlipidemia LDL goal <70 10/30/2013  . Acute prostatitis 06/19/2013  . Uncontrolled hypertension 03/01/2013  . Tobacco dependence 03/01/2013  . Elevated PSA 03/01/2013  . BPH (benign prostatic hypertrophy) with urinary obstruction 08/30/2012  . GERD (gastroesophageal reflux disease) 08/30/2012  . HTN (hypertension), benign  08/30/2012  . Encounter to establish care with new doctor 08/30/2012  . Tinnitus of both ears 08/30/2012  . Health maintenance examination 08/30/2012  . Palpable abdominal aorta 08/30/2012  . Postoperative atrial fibrillation ([place])   . Ejection fraction   . Hypertension   . Anxiety     Past Surgical History:  Procedure Laterality Date  . aortic ultrasound  07/2012   NO AAA  . COLONOSCOPY  approx 2011 per pt   Done by surgeon in [place] after a bout of diverticulitis; normal per pt report--was told to repeat in 10 yrs.  . CORONARY ARTERY BYPASS GRAFT N/A 05/12/2016   Procedure: CORONARY ARTERY BYPASS GRAFTING (CABG) TIMES THREE USING LEFT INTERNAL MAMMARY ARTERY AND RIGHT SAPHENOUS LEG VEIN HARVESTED ENDOSCOPICALLY;  Surgeon: [name], MD;  Location: [facility];  Service: Open Heart Surgery;  Laterality: N/A;  . ICD IMPLANT N/A 04/12/2019   Procedure: ICD IMPLANT;  Surgeon: Evans Lance, MD;  Location: [place] [facility] CV LAB;  Service: Cardiovascular;  Laterality: N/A;  . INGUINAL HERNIA REPAIR  2003 and 2004   Both sides.  [name] LAPAROSCOPY  2017  . LEFT HEART CATH AND CORONARY ANGIOGRAPHY N/A 05/09/2016   Procedure: Left Heart Cath and Coronary Angiography;  Surgeon: [name], MD;  Location: [place] CV LAB;  Service: Cardiovascular;  Laterality: N/A;  . LITHOTRIPSY  2004   Dr. [place] Pink in [place].  [name] LYMPHADENECTOMY Bilateral 04/30/2015   Procedure: PELVIC LYMPHADENECTOMY;  Surgeon: [name], MD;  Location: [facility];  Service: Urology;  Laterality: Bilateral;  . PROSTATE BIOPSY  02/05/15   Prostate adenocarcinoma: pt considering treatment options as of 02/19/15  . ROBOT ASSISTED LAPAROSCOPIC RADICAL PROSTATECTOMY N/A 04/30/2015   Procedure: XI ROBOTIC ASSISTED LAPAROSCOPIC RADICAL PROSTATECTOMY LEVEL 2;  Surgeon: [name], MD;  Location: [facility];  Service: Urology;  Laterality: N/A;  . TEE WITHOUT CARDIOVERSION N/A 05/12/2016   Procedure: TRANSESOPHAGEAL ECHOCARDIOGRAM (TEE);   Surgeon: [name], MD;  Location: [place];  Service: Open Heart Surgery;  Laterality: N/A;  . TRANSTHORACIC ECHOCARDIOGRAM  03/22/2006; 05/2016; 07/2016; 07/2017   2008: EF 65%, normal valves, no wall motion abnormalties, LA size normal.  05/2016 in context of non-STEMI, EF 20-25%, mild AV and MV regurg.  08/05/16: EF 30-35%, akinesis of mid lateral myoc, grd II DD, mild aortic 07/2017: EF 25-30%, mult areas of akinesis, grd I DD.       Family History  Problem Relation Age of Onset  . Hypertension Mother   . Cancer - Other Mother 46       breast  . Cancer Father        Lung ca age 82  . Diabetes Sister     Social History   Tobacco Use  . Smoking status: Former Smoker    Packs/day: 1.00    Years: 55.00    Pack years: 55.00    Types: Cigarettes  . Smokeless tobacco: Never Used  . Tobacco comment: quit in 2017  Vaping Use  . Vaping Use: Never used  Substance Use Topics  . Alcohol use: No  . Drug use: No  Home Medications Prior to Admission medications   Medication Sig Start Date End Date Taking? Authorizing Provider  ALPRAZolam Duanne Moron) 0.25 MG tablet TAKE ONE TABLET BY MOUTH AT BEDTIME AS NEEDED 09/25/19   McGowen, Adrian Blackwater, MD  aspirin EC 81 MG tablet Take 81 mg by mouth daily.    [provider]  benazepril (LOTENSIN) 20 MG tablet 1 tab po bid 08/08/19   McGowen, Adrian Blackwater, MD  diphenhydrAMINE HCl (BENADRYL ALLERGY PO) Take by mouth as needed.    [provider]  furosemide (LASIX) 20 MG tablet Take 20mg  (1 tablet) as needed for swelling. 09/09/16   [name], MD  loratadine (CLARITIN) 10 MG tablet Take 10 mg by mouth daily as needed for allergies.     [provider]  Omega-3 Fatty Acids (FISH OIL) 1000 MG CAPS Take 1,000 mg by mouth daily.     [provider]  Polyethyl Glycol-Propyl Glycol (LUBRICANT EYE DROPS) 0.4-0.3 % SOLN Place 1 drop into both eyes 3 (three) times daily as needed (dry/irritated eyes.).    [provider]  spironolactone (ALDACTONE) 25 MG tablet Take 1 tablet (25 mg total) by mouth daily. 02/20/19   [name], MD  Vitamin D, Cholecalciferol, 400 units CAPS Take 400 Units by mouth daily.     [provider]  Cetirizine HCl (ZYRTEC ALLERGY) 10 MG CAPS Take 1 capsule (10 mg total) by mouth daily. 11/06/14 11/06/14  Malvin Johns, MD    Allergies    Entresto [sacubitril-valsartan], Amlodipine, Contrast media [iodinated diagnostic agents], and Hydralazine  Review of Systems   Review of Systems  Eyes: Positive for visual disturbance.  All other systems reviewed and are negative.   Physical Exam Updated Vital Signs BP (!) 170/108   Pulse 77   Temp 98.3 F (36.8 C) (Oral)   Resp 20   Ht 5\' 10"  (1.778 m)   Wt 73 kg   SpO2 99%   BMI 23.10 kg/m   Physical Exam Vitals and nursing note reviewed.  Constitutional:      Appearance: Normal appearance.  HENT:     Head: Normocephalic.     Right Ear: Tympanic membrane normal.     Left Ear: Tympanic membrane normal.     Nose: Nose normal.     Mouth/Throat:     Mouth: Mucous membranes are moist.  Eyes:     Extraocular Movements: Extraocular movements intact.     Pupils: Pupils are equal, round, and reactive to light.     Comments: No obvious nystagmus.  His visual fields are intact.  Cardiovascular:     Rate and Rhythm: Normal rate and regular rhythm.     Pulses: Normal pulses.     Heart sounds: Normal heart sounds.  Pulmonary:     Effort: Pulmonary effort is normal.     Breath sounds: Normal breath sounds.  Abdominal:     General: Abdomen is flat.     Palpations: Abdomen is soft.  Musculoskeletal:        General: Normal range of motion.     Cervical back: Normal range of motion and neck supple.  Skin:    General: Skin is warm.     Capillary Refill: Capillary refill takes less than 2 seconds.  Neurological:     General: No focal deficit present.     Mental Status: He is alert and oriented to person,  place, and time.     Comments: No obvious facial droop, normal strength and  sensation bilaterally.  Normal finger-to-nose bilaterally.  Normal gait.  Psychiatric:        Mood and Affect: Mood normal.        Behavior: Behavior normal.     ED Results / Procedures / Treatments   Labs (all labs ordered are listed, but only abnormal results are displayed) Labs Reviewed  COMPREHENSIVE METABOLIC PANEL - Abnormal; Notable for the following components:      Result Value   Glucose, Bld 151 (*)    All other components within normal limits  CBC WITH DIFFERENTIAL/PLATELET  TSH    EKG None  Radiology CT Head Wo Contrast  Result Date: 01/01/2020 CLINICAL DATA:  Change in vision since 1 p.m. yesterday, hypertension, COPD EXAM: CT HEAD WITHOUT CONTRAST TECHNIQUE: Contiguous axial images were obtained from the base of the skull through the vertex without intravenous contrast. COMPARISON:  None. FINDINGS: Brain: Scattered hypodensities throughout the bilateral periventricular white matter are noted, consistent with age-indeterminate small vessel ischemic changes. Focal hypodensity within the right basal ganglia consistent with age-indeterminate lacunar infarct. No other signs of acute infarct or hemorrhage. The lateral ventricles and midline structures are unremarkable. No acute extra-axial fluid collections. No mass effect. Vascular: No hyperdense vessel or unexpected calcification. Skull: Normal. Negative for fracture or focal lesion. Sinuses/Orbits: No acute finding. Other: None. IMPRESSION: 1. Bilateral periventricular white matter hypodensities and right basal ganglia hypodensities, consistent with age-indeterminate small vessel ischemic changes. 2. No acute intracranial hemorrhage. Electronically Signed   By: Randa Ngo M.D.   On: 01/01/2020 17:07    Procedures Procedures (including critical care time)  Medications Ordered in ED Medications - No data to display  ED Course  I have reviewed  the triage vital signs and the nursing notes.  Pertinent labs & imaging results that were available during my care of the patient were reviewed by me and considered in my medical decision making (see chart for details).    MDM Rules/Calculators/A&P                         Daniel [name]. is a 74 y.o. male here presenting with double vision. Sent in by ophthalmology for possible stroke.  Patient has IV dye allergy.  Plan to get CT head and basic labs.  If CT is unremarkable patient will need MRI.  5:26 PM Labs unremarkable.  CT showed bilateral periventricular white matter hypodensities and right basal ganglia hypodensity.  Patient cannot get CTA due to IV contrast allergy.  I ordered MRI brain and MRA.  Patient will go to [facility] ED for MRI.  If MRI showed a stroke, patient will need admission.  If it does not show a stroke, patient can follow-up with his eye doctor.  [name] is accepting   Final Clinical Impression(s) / ED Diagnoses Final diagnoses:  None    Rx / DC Orders ED Discharge Orders    None       Drenda Freeze, MD 01/01/20 1727

## 2020-01-01 NOTE — ED Triage Notes (Signed)
Pt c/o change in vision started ~1pm yesterday-pt was seen by eye doctor and was advised to come to ED to r/o stroke-pt NAD-steady gait

## 2020-01-01 NOTE — Telephone Encounter (Signed)
Patient returned call. Stated the triage nurse told him she would speak with our office and he has not received a call back since then.

## 2020-01-02 ENCOUNTER — Observation Stay (HOSPITAL_COMMUNITY): Payer: Medicare Other

## 2020-01-02 ENCOUNTER — Encounter (HOSPITAL_COMMUNITY): Payer: Self-pay | Admitting: Internal Medicine

## 2020-01-02 ENCOUNTER — Observation Stay (HOSPITAL_BASED_OUTPATIENT_CLINIC_OR_DEPARTMENT_OTHER): Payer: Medicare Other

## 2020-01-02 DIAGNOSIS — I351 Nonrheumatic aortic (valve) insufficiency: Secondary | ICD-10-CM | POA: Diagnosis not present

## 2020-01-02 DIAGNOSIS — I6389 Other cerebral infarction: Secondary | ICD-10-CM | POA: Diagnosis not present

## 2020-01-02 DIAGNOSIS — I480 Paroxysmal atrial fibrillation: Secondary | ICD-10-CM

## 2020-01-02 DIAGNOSIS — I10 Essential (primary) hypertension: Secondary | ICD-10-CM | POA: Diagnosis not present

## 2020-01-02 DIAGNOSIS — H532 Diplopia: Secondary | ICD-10-CM

## 2020-01-02 DIAGNOSIS — Z95810 Presence of automatic (implantable) cardiac defibrillator: Secondary | ICD-10-CM

## 2020-01-02 DIAGNOSIS — H538 Other visual disturbances: Secondary | ICD-10-CM | POA: Diagnosis not present

## 2020-01-02 DIAGNOSIS — C61 Malignant neoplasm of prostate: Secondary | ICD-10-CM

## 2020-01-02 DIAGNOSIS — I255 Ischemic cardiomyopathy: Secondary | ICD-10-CM

## 2020-01-02 DIAGNOSIS — I6330 Cerebral infarction due to thrombosis of unspecified cerebral artery: Secondary | ICD-10-CM | POA: Insufficient documentation

## 2020-01-02 LAB — LIPID PANEL
Cholesterol: 159 mg/dL (ref 0–200)
HDL: 39 mg/dL — ABNORMAL LOW (ref >40)
LDL Cholesterol: 111 mg/dL — ABNORMAL HIGH (ref 0–99)
Total CHOL/HDL Ratio: 4.1 RATIO
Triglycerides: 45 mg/dL (ref <150)
VLDL: 9 mg/dL (ref 0–40)

## 2020-01-02 LAB — ECHOCARDIOGRAM COMPLETE
Area-P 1/2: 4.6 cm2
Height: 70 in
P 1/2 time: 520 msec
S' Lateral: 5.1 cm
Weight: 2576 oz

## 2020-01-02 LAB — HEMOGLOBIN A1C
Hgb A1c MFr Bld: 5.2 % (ref 4.8–5.6)
Mean Plasma Glucose: 102.54 mg/dL

## 2020-01-02 LAB — CREATININE, SERUM
Creatinine, Ser: 0.84 mg/dL (ref 0.61–1.24)
GFR, Estimated: >60 mL/min (ref >60)

## 2020-01-02 LAB — CBC
HCT: 45.1 % (ref 39.0–52.0)
Hemoglobin: 15.3 g/dL (ref 13.0–17.0)
MCH: 32.5 pg (ref 26.0–34.0)
MCHC: 33.9 g/dL (ref 30.0–36.0)
MCV: 95.8 fL (ref 80.0–100.0)
Platelets: 153 10*3/uL (ref 150–400)
RBC: 4.71 MIL/uL (ref 4.22–5.81)
RDW: 13.5 % (ref 11.5–15.5)
WBC: 5.1 10*3/uL (ref 4.0–10.5)
nRBC: 0 % (ref 0.0–0.2)

## 2020-01-02 LAB — RESP PANEL BY RT-PCR (FLU A&B, COVID) ARPGX2
Influenza A by PCR: NEGATIVE
Influenza B by PCR: NEGATIVE
SARS Coronavirus 2 by RT PCR: NEGATIVE

## 2020-01-02 MED ORDER — ENOXAPARIN SODIUM 40 MG/0.4ML ~~LOC~~ SOLN
40.0000 mg | Freq: Every day | SUBCUTANEOUS | Status: DC
  Administered 2020-01-02 – 2020-01-03 (×2): 40 mg via SUBCUTANEOUS
  Filled 2020-01-02 (×2): qty 0.4

## 2020-01-02 MED ORDER — DIPHENHYDRAMINE HCL 25 MG PO CAPS
50.0000 mg | ORAL_CAPSULE | Freq: Once | ORAL | Status: AC
  Administered 2020-01-03: 50 mg via ORAL
  Filled 2020-01-02: qty 2

## 2020-01-02 MED ORDER — DIPHENHYDRAMINE HCL 25 MG PO CAPS: 50.0000 mg | ORAL_CAPSULE | Freq: Once | ORAL | Status: DC

## 2020-01-02 MED ORDER — ACETAMINOPHEN 325 MG PO TABS: 650.0000 mg | ORAL_TABLET | ORAL | Status: DC | PRN

## 2020-01-02 MED ORDER — PERFLUTREN LIPID MICROSPHERE
1.0000 mL | INTRAVENOUS | Status: AC | PRN
  Administered 2020-01-02: 2 mL via INTRAVENOUS
  Filled 2020-01-02: qty 10

## 2020-01-02 MED ORDER — PREDNISONE 20 MG PO TABS
50.0000 mg | ORAL_TABLET | Freq: Four times a day (QID) | ORAL | Status: AC
  Administered 2020-01-02 – 2020-01-03 (×3): 50 mg via ORAL
  Filled 2020-01-02 (×3): qty 2

## 2020-01-02 MED ORDER — DIPHENHYDRAMINE HCL 50 MG/ML IJ SOLN
50.0000 mg | Freq: Once | INTRAMUSCULAR | Status: AC
  Filled 2020-01-02: qty 1

## 2020-01-02 MED ORDER — ACETAMINOPHEN 160 MG/5ML PO SOLN: 650.0000 mg | ORAL | Status: DC | PRN

## 2020-01-02 MED ORDER — STROKE: EARLY STAGES OF RECOVERY BOOK: Freq: Once | Status: DC

## 2020-01-02 MED ORDER — LABETALOL HCL 5 MG/ML IV SOLN: 10.0000 mg | INTRAVENOUS | Status: DC | PRN

## 2020-01-02 MED ORDER — DIPHENHYDRAMINE HCL 50 MG/ML IJ SOLN: 50.0000 mg | Freq: Once | INTRAMUSCULAR | Status: DC

## 2020-01-02 MED ORDER — MELATONIN 5 MG PO TABS
5.0000 mg | ORAL_TABLET | Freq: Every day | ORAL | Status: DC
  Administered 2020-01-02: 5 mg via ORAL
  Filled 2020-01-02: qty 1

## 2020-01-02 MED ORDER — ASPIRIN EC 81 MG PO TBEC
81.0000 mg | DELAYED_RELEASE_TABLET | Freq: Every day | ORAL | Status: DC
  Administered 2020-01-02 – 2020-01-03 (×2): 81 mg via ORAL
  Filled 2020-01-02 (×2): qty 1

## 2020-01-02 MED ORDER — ACETAMINOPHEN 650 MG RE SUPP: 650.0000 mg | RECTAL | Status: DC | PRN

## 2020-01-02 MED ORDER — CLOPIDOGREL BISULFATE 75 MG PO TABS
75.0000 mg | ORAL_TABLET | Freq: Every day | ORAL | Status: DC
  Administered 2020-01-02 – 2020-01-03 (×2): 75 mg via ORAL
  Filled 2020-01-02 (×2): qty 1

## 2020-01-02 MED ORDER — ATORVASTATIN CALCIUM 10 MG PO TABS
20.0000 mg | ORAL_TABLET | Freq: Every day | ORAL | Status: DC
  Administered 2020-01-02 – 2020-01-03 (×2): 20 mg via ORAL
  Filled 2020-01-02 (×2): qty 2

## 2020-01-02 NOTE — Progress Notes (Signed)
  Speech Language Pathology   Patient Details Name: Daniel [name]. MRN: [id number] DOB: Sep 11, 1945 Today's Date: 01/02/2020 Time:  -     Spoke with OT, Deidre Ala who reported pt's only complaint is diplopia and there were no indications of cognitive-speech or language concerns at this time. Full assessment does not appear warranted.             Houston Siren 01/02/2020, 11:49 AM    Orbie Pyo Colvin Caroli.Ed Risk analyst [phone] Office [phone]

## 2020-01-02 NOTE — Progress Notes (Signed)
STROKE TEAM PROGRESS NOTE   INTERVAL HISTORY No family at the bedside. Pt has diplopia and had eyeglass occluder by OT. On testing, no significant disconjugate, but he has no diplopia only with the right upper gaze, indicating left CN IV mild palsy. Can not have MRI due to shrapnel in the hand, will need to do CTA head and neck with preparation.  He had contrast allergy in the past, and then developed swelling of prostate which needed for Foley catheter for 2 weeks.  He has had prostatectomy since, felt to be low risk to have CT head and neck contrast with preparation.  He also stated that he was intolerance with statin medication due to prostate issue.  Given he has had prostatectomy, will start low-dose Lipitor.  Vitals:   01/02/20 0345 01/02/20 0545 01/02/20 0727 01/02/20 0807  BP: (!) 163/109 (!) 158/95 (!) 157/99   Pulse: 74 73 74   Resp: 20 15 16    Temp: (!) 97.5 F (36.4 C)   98.7 F (37.1 C)  TempSrc: Oral   Oral  SpO2: 98% 97% 96%   Weight:      Height:       CBC:  Recent Labs  Lab 01/01/20 1639 01/02/20 0446  WBC 5.2 5.1  NEUTROABS 3.3  --   HGB 16.5 15.3  HCT 48.2 45.1  MCV 94.9 95.8  PLT 160 778   Basic Metabolic Panel:  Recent Labs  Lab 01/01/20 1639 01/02/20 0446  NA 137  --   K 3.9  --   CL 104  --   CO2 24  --   GLUCOSE 151*  --   BUN 17  --   CREATININE 0.90 0.84  CALCIUM 9.7  --    Lipid Panel:  Recent Labs  Lab 01/02/20 0446  CHOL 159  TRIG 45  HDL 39*  CHOLHDL 4.1  VLDL 9  LDLCALC 111*   HgbA1c:  Recent Labs  Lab 01/02/20 0446  HGBA1C 5.2   Urine Drug Screen: No results for input(s): LABOPIA, COCAINSCRNUR, LABBENZ, AMPHETMU, THCU, LABBARB in the last 168 hours.  Alcohol Level No results for input(s): ETH in the last 168 hours.  IMAGING past 24 hours CT Head Wo Contrast  Result Date: 01/01/2020 CLINICAL DATA:  Change in vision since 1 p.m. yesterday, hypertension, COPD EXAM: CT HEAD WITHOUT CONTRAST TECHNIQUE: Contiguous axial  images were obtained from the base of the skull through the vertex without intravenous contrast. COMPARISON:  None. FINDINGS: Brain: Scattered hypodensities throughout the bilateral periventricular white matter are noted, consistent with age-indeterminate small vessel ischemic changes. Focal hypodensity within the right basal ganglia consistent with age-indeterminate lacunar infarct. No other signs of acute infarct or hemorrhage. The lateral ventricles and midline structures are unremarkable. No acute extra-axial fluid collections. No mass effect. Vascular: No hyperdense vessel or unexpected calcification. Skull: Normal. Negative for fracture or focal lesion. Sinuses/Orbits: No acute finding. Other: None. IMPRESSION: 1. Bilateral periventricular white matter hypodensities and right basal ganglia hypodensities, consistent with age-indeterminate small vessel ischemic changes. 2. No acute intracranial hemorrhage. Electronically Signed   By: Randa Ngo M.D.   On: 01/01/2020 17:07    PHYSICAL EXAM  Temp:  [97.5 F (36.4 C)-98.7 F (37.1 C)] 97.9 F (36.6 C) (12/02 1720) Pulse Rate:  [71-75] 74 (12/02 1720) Resp:  [14-20] 18 (12/02 1720) BP: (150-169)/(93-109) 169/103 (12/02 1720) SpO2:  [96 %-100 %] 99 % (12/02 1720)  General - Well nourished, well developed, in no apparent distress.  Ophthalmologic - fundi not visualized due to noncooperation.  Cardiovascular - Regular rhythm and rate.  Mental Status -  Level of arousal and orientation to time, place, and person were intact. Language including expression, naming, repetition, comprehension was assessed and found intact. Fund of Knowledge was assessed and was intact.  Cranial Nerves II - XII - II - Visual field intact OU. III, IV, VI - Extraocular movements intact.  However, no diplopia only with the right upper gaze, indicating left CN IV mild palsy. V - Facial sensation intact bilaterally. VII - Facial movement intact bilaterally. VIII  - Hearing & vestibular intact bilaterally. X - Palate elevates symmetrically. XI - Chin turning & shoulder shrug intact bilaterally. XII - Tongue protrusion intact.  Motor Strength - [name] strength was normal in all extremities and pronator drift was absent.  Bulk was normal and fasciculations were absent.   Motor Tone - Muscle tone was assessed at the neck and appendages and was normal.  Reflexes - [name] reflexes were symmetrical in all extremities and he had no pathological reflexes.  Sensory - Light touch, temperature/pinprick were assessed and were symmetrical.    Coordination - The patient had normal movements in the hands and feet with no ataxia or dysmetria.  Tremor was absent.  Gait and Station - deferred.   ASSESSMENT/PLAN Daniel [name]. is a 74 y.o. male with history of transient atrial fibrillation (2013), hypertension, hyperlipidemia (statin intolerant, declined advanced lipid clinic referral 08/2019), ischemic cardiomyopathy status post ICD, COPD, nephrolithiasis, prostate adenocarcinoma status post prostatectomy and lymphadenectomy, former tobacco abuse (55-pack-year smoking history) presenting to [facility] from opthamologist's office with double vision x 2 days.   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   CTA head and neck with preparation pending  Not able to have MRI due to shrapnel in the head  2D Echo EF 20-25%  LDL 111  HgbA1c 5.2  VTE prophylaxis - Lovenox 40 mg sq daily   aspirin 81 mg daily prior to admission, now on aspirin 81 mg daily and plavix DAPT.   Therapy recommendations:  No PT, no PT  Disposition:  Return home  Hx Atrial Fibrillation  Home anticoagulation:  none   07/2011 - seen in ED w/ AF RVR where he spontaneously converted. Only 1 known long episode.CHADS score was  1, decided not to anticoagulate ([name])  05/2016 - NSTEMI, CABG. Developed AF POD#4. Converted to NSR on amio. Pt self d/c'd amio d/t dyspnea. Continue BB. Consider AC w/ AF recurrence  10/2019 - Pacer shows < 1 % burden with 0 episodes of AF during download  Pt reports palpitations at times  Pacer interrogation this admission showed no A. fib, only one episode of atrial tachycardia.  Now on DAPT  Follow up with Dr. Lovena Le   Cardiomyopathy  Chronic systolic heart failure, EF 20%  Status post ICD insertion 04/2019  Followed by Dr. Lovena Le  On aspirin 81 at home  Not able tolerating Entresto due to rash  This admission EF stable 20 to 25%.  Hypertension  Stable . Long-term BP goal normotensive  Hyperlipidemia  Home meds:  Omega 3  Treated w/ statins in the past - took lipitor 80 after MI/CABG in 05/2016 - in ~ Aug changed to fish oil without explanation - per pt due to prostate issue. Now  given he has had proctectomy, will start low dose statin   LDL 111, goal < 70  Start lipitor 20   Continue statin at discharge  Other Stroke Risk Factors  Advanced Age >/= 54   Former Cigarette smoker  Coronary artery disease, NSTEMI, CABG 05/2016  Other Active Problems  Hx prostate cancer s/p Sx  Hospital day # 0  Rosalin Hawking, MD PhD Stroke Neurology 01/02/2020 7:33 PM    To contact Stroke Continuity provider, please refer to [email or web address] After hours, contact General Neurology

## 2020-01-02 NOTE — ED Notes (Signed)
Lunch Tray Ordered @ 1027. °

## 2020-01-02 NOTE — Progress Notes (Signed)
Patient came down to MRI and made me aware that he has a piece of shrapnel  in the left orbital/forehead area that is very ferrous. I talked to the Radiologist to see if we could proceed and she said it was not a good idea to proceed with the MRI due to this. she recommends doing a CTA instead. I have reached out to the ordering MD to let them know, no response as of right now.

## 2020-01-02 NOTE — Progress Notes (Signed)
Daniel [name]. is a 74 y.o. male with history of chronic systolic heart failure status post ICD placement and pacemaker, history of CAD status post CABG, hypertension, COPD history of A. fib briefly after CABG presents to the ER after patient was referred by patient's ophthalmologist with concern for possible stroke.  Patient has been having diplopia since 3 PM on December 31, 2019.  Patient went to check with his ophthalmologist on January 01, 2020 and was referred to the ER at [facility].  Denies any difficulty swallowing speaking or weakness of the upper or lower extremities.  Patient states when he closes his 1 eye the diplopia disappears.  No facial asymmetry.  ED Course: In the ER CT head was showing age-indeterminate vascular changes.  Patient was transferred from [facility] to [facility] for MRI.  But since patient has ICD will need technician to adjust ICD before getting MRI.  Neurologist on-call was consulted. Patient did pass swallow swallow. Labs are largely unremarkable. Covid test is negative.  Neurology has been consulted. MRI was ordered, but had to be cancelled due to shrapnel that the patient has in his forehead. CTA head and neck are pending.   Echocardiogram was performed. It demonstrated. EF of 20-25% with severely decreased function of the LV. There are also regional wall motion abnormalities.RV is normal.   The patient was awake, alert, and oriented x 3. He is in no acute distress. He continues to have diplopia. He has been evaluated by OT. They have given the patient a patch and have made a recommendation for "prism" glasses on discharge to help with the diplopia. Heart and lung exam are within normal limits.  This patient was admitted by my colleague Dr. Hal Hope early this morning.

## 2020-01-02 NOTE — ED Notes (Signed)
Son at bedside.

## 2020-01-02 NOTE — Progress Notes (Signed)
Occupational Therapy Evaluation Patient Details Name: Daniel [name]. MRN: [id number] DOB: Jan 11, 1946 Today's Date: 01/02/2020    History of Present Illness 74 y.o. 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  Presents with complaints of vertical diplopia in distant vision.    Clinical Impression   PTA pt lives independently with his wife, is active on his farm, does Dealer work and drives. Pt presents with apparent vertical diplopia in distant vision. Partial occlusion used on non-dominant eye to improve functional vision. Pt reports taping helps significantly. Son/pt educated on use of partial occlusion and handout provided. Recommend pt follow up with his eye doctor/optometrist to monitor progression of double vision and possible use of prisms if needed. Educated pt/son on safety concerns and strategies to reduce risk of falls. Pt scored a 24/24 on the DGI, indicating a very low fall risk. No further OT needed.     Follow Up Recommendations  Supervision - Intermittent (follow up with eye doctor)    Equipment Recommendations  None recommended by OT    Recommendations for Other Services       Precautions / Restrictions Precautions Precautions: None      Mobility Bed Mobility Overal bed mobility: Independent                  Transfers Overall transfer level: Independent                    Balance Overall balance assessment: Modified Independent                               Standardized Balance Assessment Standardized Balance Assessment : Dynamic Gait Index   Dynamic Gait Index Level Surface: Normal Change in Gait Speed: Normal Gait with Horizontal  Head Turns: Normal Gait with Vertical Head Turns: Normal Gait and Pivot Turn: Normal Step Over Obstacle: Normal Step Around Obstacles: Normal Steps: Normal Total Score: 24     ADL either performed or assessed with clinical judgement   ADL Overall ADL's : At baseline                                             Vision Baseline Vision/History: Wears glasses Wears Glasses: At all times Patient Visual Report: Diplopia Vision Assessment?: Yes Eye Alignment: Within Functional Limits (possible dysconjugate in L gaze) Ocular Range of Motion: Within Functional Limits Alignment/Gaze Preference: Head tilt Tracking/Visual Pursuits: Decreased smoothness of horizontal tracking;Decreased smoothness of vertical tracking Saccades: Additional head turns occurred during testing Convergence: Within functional limits Visual Fields: No apparent deficits Diplopia Assessment: Objects split on top of one another;Disappears with one eye closed;Present in far gaze Depth Perception: Overshoots (impaired)     Perception Perception Comments: WFL   Praxis Praxis Praxis tested?: Within functional limits    Pertinent Vitals/Pain Pain Assessment: No/denies pain     Hand Dominance Right   Extremity/Trunk Assessment Upper Extremity Assessment Upper Extremity Assessment: Overall WFL for tasks assessed   Lower Extremity Assessment Lower Extremity Assessment: Overall WFL for tasks assessed   Cervical / Trunk Assessment Cervical / Trunk Assessment: Normal   Communication Communication Communication: [facility]  Cognition Arousal/Alertness: Awake/alert Behavior During Therapy: WFL for tasks assessed/performed Overall Cognitive Status: Within Functional Limits for tasks assessed                                     General Comments  son present and educated on use of partial occlusion to improve functional vision. Hand out provided. Son verbalized understanding     Exercises     Shoulder Instructions      Home Living Family/patient expects to be discharged to:: Private residence Living Arrangements: Spouse/significant other Available Help at Discharge: Available 24 hours/day Type of Home: House Home Access: Stairs to enter;Ramped entrance Entrance Stairs-Number of Steps: 2   Home Layout: One level     Bathroom Shower/Tub: Occupational psychologist: Handicapped height     Home Equipment: Environmental consultant - 2 wheels;Cane - single point;Wheelchair - manual          Prior Functioning/Environment Level of Independence: Independent        Comments: drives; does own medication management; works on his farm; Dealer work - personal        OT Problem List: Impaired vision/perception      OT Treatment/Interventions:      OT Goals(Current goals can be found in the care plan section) Acute Rehab OT Goals Patient Stated Goal: to go home and 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 OT Goal Formulation: All assessment and education complete, DC therapy  OT Frequency:     Barriers to D/C:            Co-evaluation              AM-PAC OT "6 Clicks" Daily Activity     Outcome Measure Help from another person eating meals?: None Help from another person taking care of personal grooming?: None Help from another person toileting, which includes using toliet, bedpan, or urinal?: None Help from another person bathing (including washing, rinsing, drying)?: None Help from another person to put on and taking off regular upper body clothing?: None Help from another person to put on and taking off regular lower body clothing?: None 6 Click Score: 24   End of Session Nurse Communication: Other (comment);Mobility status (DC needs; use of partial occlusion)  Activity Tolerance: Patient tolerated treatment well Patient left: in bed;with call bell/phone within reach;with family/visitor present  OT Visit Diagnosis: Low vision, 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

## 2020-01-02 NOTE — Progress Notes (Signed)
Pt is gone to MRI but per OT has been up walking independently.  Check back as time and pt allow.   01/02/20 1100  PT Visit Information  Reason Eval/Treat Not Completed Other (comment)    Mee Hives, PT MS Acute Rehab Dept. Number: [place] and [place]

## 2020-01-02 NOTE — Progress Notes (Signed)
  Echocardiogram 2D Echocardiogram has been performed.  Daniel [name] 01/02/2020, 11:33 AM

## 2020-01-02 NOTE — Consult Note (Signed)
Neurology Consultation Reason for Consult: Diplopia Requesting Physician: Karie Kirks  CC: Double vision  History is obtained from: Patient  HPI: Daniel Bos. is a 74 y.o. 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  Objects were stacked straight vertically on top of each other.  It was more noticeable for distant objects.  He reports that this has been a persistent change but he feels it is worst at night.  Otherwise on review of systems he reports a runny nose and sinus pressure for the past 2 days.  He reports he is not vaccinated against COVID-19 as he is waiting for a pill that he can take instead of an injection.  He denies any proximal muscle weakness, any numbness/tingling, any prior episodes of double vision, any headaches, recent changes in his hearing, trouble with his speech or swallow, focal or generalized weakness, fatigue, nausea, vomiting, fever, or other infectious symptoms not mentioned above.  He reports he does continue to have episodes of palpitations that he thinks is due to his atrial fibrillation.  However he reports he "bleeds very freely" at baseline, with any small cut leading to large amounts of blood.  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  He does not remember his adverse reaction to statin medication.  It is unclear whether he would be willing to start a statin at this time.  He does follow with cardiology for his significant  cardiac history.  LKW: 11/29 afternoon tPA given?: No, due to out of the window Premorbid modified rankin scale:      0 - No symptoms.  ROS: A 14 point ROS was performed and is negative except as noted in the HPI.  Past Medical History:  Diagnosis Date  . Anxiety   . Atrial fibrillation ([place])    Limited episode, emergency room, June, 2013, spontaneous conversion to sinus rhythm, was evaluated By [name] 2013- no further visits.  Post-op (CABG) a-fib, converted on amio but pt self d/c'd this med due to DOE/side effect profile.  [name] CAD (coronary artery disease) 05/2016   a. 05/2016: NSTEMI with cath showing 3-vessel disease. Underwent CABG on 05/12/16 with LIMA-D1, SVG-LAD, and SVG-PDA.   [name] COPD (chronic obstructive pulmonary disease) ([place]) fall 2016   Bullous changes noted on lower lung images of CT abd done by urology  . Diverticulitis   . GERD (gastroesophageal reflux disease)   . Has received pneumococcal vaccination   . Hyperlipidemia    statin intolerant. Pt declined advanced lipid clinic referral 08/2019  . Hypertension   . Ischemic cardiomyopathy 05/2016   EF 20-25% at the time of NSTEMI/CABG.  Repeat EF 07/2016 30-35%.  07/2017 EF 25-30%, pt intol of entresto, bidil, and BB--for ICD 03/2019.  . Microscopic hematuria Fall 2016   CT nl except nonobstructing stones.  Cystoscopy normal 12/30/14 ([name])  . Nephrolithiasis    11 mm stone on R, 2 mm stone on L, ureters clear  . Non-STEMI (non-ST elevated myocardial infarction) ([place]) 05/2016   with impaired LV function  .  Prostatic adenocarcinoma ([place]) 02/2015   No evidence of metastatic dz on CT pelv 02/2015.  Urol ([name]) at [facility]; [name] referred him to [name] 03/2015: patient got bilat nerve sparing , robot assisted laparoscopic radical prostatectomy and pelvic lymphadenectomy.  Urol f/u, PSA undetectable on repeat 04/22/16 and 10/28/16; pt chose PSA surveill over adjuv rad tx.   . Tobacco dependence    down to 1-2  cigs per day as of 11/2015.  Restarted 08/2016.  [name] Urinary retention 05/2015   occurred s/p foley removal   Past Surgical History:  Procedure Laterality Date  . aortic ultrasound  07/2012   NO AAA  . COLONOSCOPY  approx 2011 per pt   Done by surgeon in [place] after a bout of diverticulitis; normal per pt report--was told to repeat in 10 yrs.  . CORONARY ARTERY BYPASS GRAFT N/A 05/12/2016   Procedure: CORONARY ARTERY BYPASS GRAFTING (CABG) TIMES THREE USING LEFT INTERNAL MAMMARY ARTERY AND RIGHT SAPHENOUS LEG VEIN HARVESTED ENDOSCOPICALLY;  Surgeon: [name], MD;  Location: [place];  Service: Open Heart Surgery;  Laterality: N/A;  . ICD IMPLANT N/A 04/12/2019   Procedure: ICD IMPLANT;  Surgeon: Evans Lance, MD;  Location: [place] [facility];  Service: Cardiovascular;  Laterality: N/A;  . INGUINAL HERNIA REPAIR  2003 and 2004   Both sides.  [name] LAPAROSCOPY  2017  . LEFT HEART CATH AND CORONARY ANGIOGRAPHY N/A 05/09/2016   Procedure: Left Heart Cath and Coronary Angiography;  Surgeon: [name], MD;  Location: [place] [facility];  Service: Cardiovascular;  Laterality: N/A;  . LITHOTRIPSY  2004   Dr. [place] Pink in [place].  [name] LYMPHADENECTOMY Bilateral 04/30/2015   Procedure: PELVIC LYMPHADENECTOMY;  Surgeon: [name], MD;  Location: [facility];  Service: Urology;  Laterality: Bilateral;  . PROSTATE BIOPSY  02/05/15   Prostate adenocarcinoma: pt considering treatment options as of 02/19/15  . ROBOT ASSISTED LAPAROSCOPIC RADICAL PROSTATECTOMY N/A 04/30/2015   Procedure: XI ROBOTIC ASSISTED LAPAROSCOPIC RADICAL PROSTATECTOMY LEVEL 2;  Surgeon: [name], MD;  Location: [facility];  Service: Urology;  Laterality: N/A;  . TEE WITHOUT CARDIOVERSION N/A 05/12/2016   Procedure: TRANSESOPHAGEAL ECHOCARDIOGRAM (TEE);  Surgeon: [name], MD;  Location: [place];  Service: Open Heart Surgery;  Laterality: N/A;  . TRANSTHORACIC ECHOCARDIOGRAM  03/22/2006; 05/2016; 07/2016; 07/2017   2008: EF 65%,  normal valves, no wall motion abnormalties, LA size normal.  05/2016 in context of non-STEMI, EF 20-25%, mild AV and MV regurg.  08/05/16: EF 30-35%, akinesis of mid lateral myoc, grd II DD, mild aortic 07/2017: EF 25-30%, mult areas of akinesis, grd I DD.   Current Outpatient Medications  Medication Instructions  . ALPRAZolam (XANAX) 0.25 MG tablet TAKE ONE TABLET BY MOUTH AT BEDTIME AS NEEDED  . aspirin EC 81 mg, Oral, Daily  . benazepril (LOTENSIN) 20 MG tablet 1 tab po bid  . diphenhydrAMINE HCl (BENADRYL ALLERGY PO) Oral, As needed  . Fish Oil 1,000 mg, Oral, Daily  . furosemide (LASIX) 20 MG tablet Take 20mg  (1 tablet) as needed for swelling.  . loratadine (CLARITIN) 10 mg, Oral, Daily PRN  . Polyethyl Glycol-Propyl Glycol (LUBRICANT EYE DROPS) 0.4-0.3 % SOLN 1 drop, Both Eyes, 3 times daily PRN  . spironolactone (ALDACTONE) 25 mg, Oral, Daily  . Vitamin D (Cholecalciferol) 400 Units, Oral, Daily     Family History  Problem Relation Age of Onset  . Hypertension Mother   . Cancer - Other Mother 76  breast  . Cancer Father        Lung ca age 22  . Diabetes Sister    Social History:  reports that he has quit smoking. His smoking use included cigarettes. He has a 55.00 pack-year smoking history. He has never used smokeless tobacco. He reports that he does not drink alcohol and does not use drugs.   Exam: Current vital signs: BP (!) 152/93 (BP Location: Right Arm)   Pulse 72   Temp 97.7 F (36.5 C)   Resp 16   Ht 5\' 10"  (1.778 m)   Wt 73 kg   SpO2 97%   BMI 23.10 kg/m  Vital signs in last 24 hours: Temp:  [97.7 F (36.5 C)-98.3 F (36.8 C)] 97.7 F (36.5 C) (12/01 2215) Pulse Rate:  [72-85] 72 (12/01 2352) Resp:  [16-20] 16 (12/01 2352) BP: (150-175)/(93-108) 152/93 (12/01 2352) SpO2:  [97 %-100 %] 97 % (12/01 2352) Weight:  [73 kg] 73 kg (12/01 1624)   Physical Exam  Constitutional: Appears well-developed and well-nourished.  Psych: Affect appropriate to  situation Eyes: No scleral injection HENT: No OP obstruction, fair dentition MSK: no joint deformities.  Cardiovascular: Normal rate and regular rhythm.  Respiratory: Effort normal, non-labored breathing GI: Soft.  No distension. There is no tenderness.  Skin: WDI, some small cuts in various stages of healing on his hands and arms  Neuro: Mental Status: Patient is awake, alert, oriented to person, place, month, year, and situation. Patient is able to give a clear and coherent history. No signs of aphasia or neglect Cranial Nerves: II: Visual Fields are full. Pupils are equal, round, and reactive to light.   III,IV, VI: EOMI without ptosis; binocular diploplia (vertical, in primary gaze that resolves on lateral gaze on either side), no clear nystagmus, no clear catch-up saccades, and uncover cover testing no visible change in eye position V: Facial sensation is symmetric to temperature VII: Facial movement is symmetric.  VIII: hearing is hard of hearing at baseline, requires mask to be removed as he reads lips X: Uvula elevates symmetrically XI: Shoulder shrug is symmetric. XII: tongue is midline without atrophy or fasciculations.  Motor: Tone is normal. Bulk is normal. 5/5 strength was present in all four extremities.  Sensory: Sensation is symmetric to light touch and temperature in the arms and legs. Deep Tendon Reflexes: 2+ and symmetric in the biceps and patellae.  Cerebellar: FNF and HKS are intact bilaterally  NIHSS total 0  I have reviewed labs in epic and the results pertinent to this consultation are:  Lab Results  Component Value Date   CHOL 154 08/08/2019   HDL 39.30 08/08/2019   LDLCALC 105 (H) 08/08/2019   TRIG 51.0 08/08/2019   CHOLHDL 4 08/08/2019   Lab Results  Component Value Date   HGBA1C 5.5 08/21/2019    Lab Results  Component Value Date   TSH 0.680 01/01/2020     I have reviewed the images obtained: Head CT without acute intracranial  process   Impression: This is a 74 year old gentleman with significant vascular risk factors as detailed above, presenting with new onset diplopia.  On my examination he does not have a clear vertical skew.  Differential diagnosis includes the possibility of a small brainstem stroke, brainstem vascular lesion, myasthenia gravis, thyroid eye disease; there is also the potential of an infectious etiology such as syphilis.  Doubt TB without any prior history.  Given the sudden onset and his known vascular risk factors, a stroke  seems to be the most likely etiology  Recommendations: -MRI brain without contrast, MRA brain without contrast, MRA neck with and without contrast -Hemoglobin A1c, cholesterol panel -Thyroid antibodies -Antiacetylcholine receptor antibodies, MuSK antibodies -RPR  Lesleigh Noe MD-PhD [facility] [phone]

## 2020-01-02 NOTE — ED Notes (Signed)
Hospitalist aware of increased trop. And cards has been consulted.

## 2020-01-02 NOTE — ED Notes (Signed)
Patient transported to [facility] lab.

## 2020-01-02 NOTE — H&P (Signed)
History and Physical    Daniel [name]. XIP:[id number] DOB: 05/08/1945 DOA: 01/01/2020  PCP: [name], MD  Patient coming from: Home.  Chief Complaint: Diplopia.  HPI: Daniel [name]. is a 74 y.o. male with history of chronic systolic heart failure status post ICD placement and pacemaker, history of CAD status post CABG, hypertension, COPD history of A. fib briefly after CABG presents to the ER after patient was referred by patient's ophthalmologist with concern for possible stroke.  Patient has been having diplopia since 3 PM on December 31, 2019.  Patient went to check with his ophthalmologist on January 01, 2020 and was referred to the ER at [facility].  Denies any difficulty swallowing speaking or weakness of the upper or lower extremities.  Patient states when he closes his 1 eye the diplopia disappears.  No facial asymmetry.  ED Course: In the ER CT head was showing age-indeterminate vascular changes.  Patient was transferred from [facility] to [facility] for MRI.  But since patient has ICD will need technician to adjust ICD before getting MRI.  Neurologist on-call was consulted.  Patient did pass swallow swallow.  Labs are largely unremarkable.  Covid test is negative.  Review of Systems: As per HPI, rest all negative.   Past Medical History:  Diagnosis Date  . Anxiety   . Atrial fibrillation ([place])    Limited episode, emergency room, June, 2013, spontaneous conversion to sinus rhythm, was evaluated By [name] 2013- no further visits.  Post-op (CABG) a-fib, converted on amio but pt self d/c'd this med due to DOE/side effect profile.  [name] CAD (coronary artery disease) 05/2016   a. 05/2016: NSTEMI with cath showing 3-vessel disease. Underwent CABG on 05/12/16 with LIMA-D1, SVG-LAD, and SVG-PDA.   [name] COPD (chronic obstructive pulmonary disease) ([place]) fall 2016   Bullous changes noted on lower lung images of CT abd done by urology  . Diverticulitis   . GERD  (gastroesophageal reflux disease)   . Has received pneumococcal vaccination   . Hyperlipidemia    statin intolerant. Pt declined advanced lipid clinic referral 08/2019  . Hypertension   . Ischemic cardiomyopathy 05/2016   EF 20-25% at the time of NSTEMI/CABG.  Repeat EF 07/2016 30-35%.  07/2017 EF 25-30%, pt intol of entresto, bidil, and BB--for ICD 03/2019.  . Microscopic hematuria Fall 2016   CT nl except nonobstructing stones.  Cystoscopy normal 12/30/14 ([name])  . Nephrolithiasis    11 mm stone on R, 2 mm stone on L, ureters clear  . Non-STEMI (non-ST elevated myocardial infarction) ([place]) 05/2016   with impaired LV function  . 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  Urol ([name]) at [facility]; [name] referred him to [name] 03/2015: patient got bilat nerve sparing , robot assisted laparoscopic radical prostatectomy and pelvic lymphadenectomy.  Urol f/u, PSA undetectable on repeat 04/22/16 and 10/28/16; pt chose PSA surveill over adjuv rad tx.   . Tobacco dependence    down to 1-2 cigs per day as of 11/2015.  Restarted 08/2016.  [name] Urinary retention 05/2015   occurred s/p foley removal    Past Surgical History:  Procedure Laterality Date  . aortic ultrasound  07/2012   NO AAA  . COLONOSCOPY  approx 2011 per pt   Done by surgeon in [place] after a bout of diverticulitis; normal per pt report--was told to repeat in  10 yrs.  . CORONARY ARTERY BYPASS GRAFT N/A 05/12/2016   Procedure: CORONARY ARTERY BYPASS GRAFTING (CABG) TIMES THREE USING LEFT INTERNAL MAMMARY ARTERY AND RIGHT SAPHENOUS LEG VEIN HARVESTED ENDOSCOPICALLY;  Surgeon: [name], MD;  Location: [place];  Service: Open Heart Surgery;  Laterality: N/A;  . ICD IMPLANT N/A 04/12/2019   Procedure: ICD IMPLANT;  Surgeon: Evans Lance, MD;  Location: [place] [facility];  Service: Cardiovascular;  Laterality: N/A;  . INGUINAL HERNIA REPAIR  2003 and 2004   Both sides.    [name] LAPAROSCOPY  2017  . LEFT HEART CATH AND CORONARY ANGIOGRAPHY N/A 05/09/2016   Procedure: Left Heart Cath and Coronary Angiography;  Surgeon: [name], MD;  Location: [place] [facility];  Service: Cardiovascular;  Laterality: N/A;  . LITHOTRIPSY  2004   Dr. [place] Pink in [place].  [name] LYMPHADENECTOMY Bilateral 04/30/2015   Procedure: PELVIC LYMPHADENECTOMY;  Surgeon: [name], MD;  Location: [facility];  Service: Urology;  Laterality: Bilateral;  . PROSTATE BIOPSY  02/05/15   Prostate adenocarcinoma: pt considering treatment options as of 02/19/15  . ROBOT ASSISTED LAPAROSCOPIC RADICAL PROSTATECTOMY N/A 04/30/2015   Procedure: XI ROBOTIC ASSISTED LAPAROSCOPIC RADICAL PROSTATECTOMY LEVEL 2;  Surgeon: [name], MD;  Location: [facility];  Service: Urology;  Laterality: N/A;  . TEE WITHOUT CARDIOVERSION N/A 05/12/2016   Procedure: TRANSESOPHAGEAL ECHOCARDIOGRAM (TEE);  Surgeon: [name], MD;  Location: [place];  Service: Open Heart Surgery;  Laterality: N/A;  . TRANSTHORACIC ECHOCARDIOGRAM  03/22/2006; 05/2016; 07/2016; 07/2017   2008: EF 65%, normal valves, no wall motion abnormalties, LA size normal.  05/2016 in context of non-STEMI, EF 20-25%, mild AV and MV regurg.  08/05/16: EF 30-35%, akinesis of mid lateral myoc, grd II DD, mild aortic 07/2017: EF 25-30%, mult areas of akinesis, grd I DD.     reports that he has quit smoking. His smoking use included cigarettes. He has a 55.00 pack-year smoking history. He has never used smokeless tobacco. He reports that he does not drink alcohol and does not use drugs.  Allergies  Allergen Reactions  . Entresto [Sacubitril-Valsartan] Hives and Shortness Of Breath  . Amlodipine Swelling    LE swelling  . Contrast Media [Iodinated Diagnostic Agents] Other (See Comments)    Prostate problem had to wear a catheter for two weeks after having dye.   [name] Hydralazine Palpitations and Other (See Comments)    Fatigue+    Family History  Problem Relation Age  of Onset  . Hypertension Mother   . Cancer - Other Mother 26       breast  . Cancer Father        Lung ca age 81  . Diabetes Sister     Prior to Admission medications   Medication Sig Start Date End Date Taking? Authorizing Provider  ALPRAZolam Duanne Moron) 0.25 MG tablet TAKE ONE TABLET BY MOUTH AT BEDTIME AS NEEDED 09/25/19   McGowen, Adrian Blackwater, MD  aspirin EC 81 MG tablet Take 81 mg by mouth daily.    [provider]  benazepril (LOTENSIN) 20 MG tablet 1 tab po bid 08/08/19   McGowen, Adrian Blackwater, MD  diphenhydrAMINE HCl (BENADRYL ALLERGY PO) Take by mouth as needed.    [provider]  furosemide (LASIX) 20 MG tablet Take 20mg  (1 tablet) as needed for swelling. 09/09/16   [name], MD  loratadine (CLARITIN) 10 MG tablet Take 10 mg by mouth daily as needed for allergies.  [provider]  Omega-3 Fatty Acids (FISH OIL) 1000 MG CAPS Take 1,000 mg by mouth daily.     [provider]  Polyethyl Glycol-Propyl Glycol (LUBRICANT EYE DROPS) 0.4-0.3 % SOLN Place 1 drop into both eyes 3 (three) times daily as needed (dry/irritated eyes.).    [provider]  spironolactone (ALDACTONE) 25 MG tablet Take 1 tablet (25 mg total) by mouth daily. 02/20/19   [name], MD  Vitamin D, Cholecalciferol, 400 units CAPS Take 400 Units by mouth daily.     [provider]  Cetirizine HCl (ZYRTEC ALLERGY) 10 MG CAPS Take 1 capsule (10 mg total) by mouth daily. 11/06/14 11/06/14  Malvin Johns, MD    Physical Exam: Constitutional: Moderately built and nourished. Vitals:   01/01/20 1730 01/01/20 1736 01/01/20 2215 01/01/20 2352  BP: (!) 175/102  (!) 150/95 (!) 152/93  Pulse: 73  74 72  Resp: 16  18 16   Temp:  97.8 F (36.6 C) 97.7 F (36.5 C)   TempSrc:  Oral    SpO2: 100%  98% 97%  Weight:      Height:       Eyes: Anicteric no pallor. ENMT: No discharge from the ears eyes nose or mouth. Neck: No mass felt.  No neck rigidity. Respiratory: No  rhonchi or crepitations. Cardiovascular: S1-S2 heard. Abdomen: Soft nontender bowel sounds present. Musculoskeletal: No edema. Skin: No rash. Neurologic: Alert awake oriented to time place and person.  Moves all extremities 5 x 5.  No facial asymmetry tongue is midline.  Pupils equal and reacting to light. Psychiatric: Appears normal.  Normal affect.   Labs on Admission: I have personally reviewed following labs and imaging studies  CBC: Recent Labs  Lab 01/01/20 1639  WBC 5.2  NEUTROABS 3.3  HGB 16.5  HCT 48.2  MCV 94.9  PLT 382   Basic Metabolic Panel: Recent Labs  Lab 01/01/20 1639  NA 137  K 3.9  CL 104  CO2 24  GLUCOSE 151*  BUN 17  CREATININE 0.90  CALCIUM 9.7   GFR: Estimated Creatinine Clearance: 74.4 mL/min (by C-G formula based on SCr of 0.9 mg/dL). Liver Function Tests: Recent Labs  Lab 01/01/20 1639  AST 25  ALT 19  ALKPHOS 74  BILITOT 0.9  PROT 7.0  ALBUMIN 4.4   No results for input(s): LIPASE, AMYLASE in the last 168 hours. No results for input(s): AMMONIA in the last 168 hours. Coagulation Profile: No results for input(s): INR, PROTIME in the last 168 hours. Cardiac Enzymes: No results for input(s): CKTOTAL, CKMB, CKMBINDEX, TROPONINI in the last 168 hours. BNP (last 3 results) No results for input(s): PROBNP in the last 8760 hours. HbA1C: No results for input(s): HGBA1C in the last 72 hours. CBG: No results for input(s): GLUCAP in the last 168 hours. Lipid Profile: No results for input(s): CHOL, HDL, LDLCALC, TRIG, CHOLHDL, LDLDIRECT in the last 72 hours. Thyroid Function Tests: Recent Labs    01/01/20 1639  TSH 0.680   Anemia Panel: No results for input(s): VITAMINB12, FOLATE, FERRITIN, TIBC, IRON, RETICCTPCT in the last 72 hours. Urine analysis:    Component Value Date/Time   COLORURINE RED (A) 05/07/2016 1204   APPEARANCEUR TURBID (A) 05/07/2016 1204   LABSPEC  05/07/2016 1204    TEST NOT REPORTED DUE TO COLOR  INTERFERENCE OF URINE PIGMENT   PHURINE  05/07/2016 1204    TEST NOT REPORTED DUE TO COLOR INTERFERENCE OF URINE PIGMENT   GLUCOSEU (A) 05/07/2016 1204  TEST NOT REPORTED DUE TO COLOR INTERFERENCE OF URINE PIGMENT   HGBUR (A) 05/07/2016 1204    TEST NOT REPORTED DUE TO COLOR INTERFERENCE OF URINE PIGMENT   BILIRUBINUR (A) 05/07/2016 1204    TEST NOT REPORTED DUE TO COLOR INTERFERENCE OF URINE PIGMENT   BILIRUBINUR negative 11/14/2014 1009   KETONESUR (A) 05/07/2016 1204    TEST NOT REPORTED DUE TO COLOR INTERFERENCE OF URINE PIGMENT   PROTEINUR (A) 05/07/2016 1204    TEST NOT REPORTED DUE TO COLOR INTERFERENCE OF URINE PIGMENT   UROBILINOGEN 1.0 11/14/2014 1009   UROBILINOGEN 1.0 11/29/2008 1159   NITRITE (A) 05/07/2016 1204    TEST NOT REPORTED DUE TO COLOR INTERFERENCE OF URINE PIGMENT   LEUKOCYTESUR (A) 05/07/2016 1204    TEST NOT REPORTED DUE TO COLOR INTERFERENCE OF URINE PIGMENT   Sepsis Labs: @LABRCNTIP (procalcitonin:4,lacticidven:4) )No results found for this or any previous visit (from the past 240 hour(s)).   Radiological Exams on Admission: CT Head Wo Contrast  Result Date: 01/01/2020 CLINICAL DATA:  Change in vision since 1 p.m. yesterday, hypertension, COPD EXAM: CT HEAD WITHOUT CONTRAST TECHNIQUE: Contiguous axial images were obtained from the base of the skull through the vertex without intravenous contrast. COMPARISON:  None. FINDINGS: Brain: Scattered hypodensities throughout the bilateral periventricular white matter are noted, consistent with age-indeterminate small vessel ischemic changes. Focal hypodensity within the right basal ganglia consistent with age-indeterminate lacunar infarct. No other signs of acute infarct or hemorrhage. The lateral ventricles and midline structures are unremarkable. No acute extra-axial fluid collections. No mass effect. Vascular: No hyperdense vessel or unexpected calcification. Skull: Normal. Negative for fracture or focal lesion.  Sinuses/Orbits: No acute finding. Other: None. IMPRESSION: 1. Bilateral periventricular white matter hypodensities and right basal ganglia hypodensities, consistent with age-indeterminate small vessel ischemic changes. 2. No acute intracranial hemorrhage. Electronically Signed   By: Randa Ngo M.D.   On: 01/01/2020 17:07     Assessment/Plan Principal Problem:   Blurred vision Active Problems:   Hypertension   Prostate cancer [facility])   Ischemic cardiomyopathy   Coronary artery disease involving coronary bypass graft of native heart without angina pectoris   ICD (implantable cardioverter-defibrillator) in place    1. Diplopia concerning for possible stroke for which neurology has been consulted I did discuss with on-call neurologist Dr. Curly [place].  Plan is to get MRI of the brain MRA of the brain and neck and 2D echo.  Aspirin check hemoglobin A1c lipid panel.  Neurochecks.  Patient did pass well. 2. History of CHF status post ICD placement and hypertension allow for permissive hypertension.  Hold diuretics for now. 3. History of prostate cancer.  EKG is pending.   DVT prophylaxis: Lovenox. Code Status: Full code. Family Communication: Discussed with patient. Disposition Plan: Home. Consults called: Neurologist. Admission status: Observation.   Rise Patience MD [facility] Pager [phone].  If 7PM-7AM, please contact night-coverage [email or web address] Password [facility]  01/02/2020, 12:45 AM

## 2020-01-03 ENCOUNTER — Encounter: Payer: Self-pay | Admitting: Family Medicine

## 2020-01-03 ENCOUNTER — Observation Stay (HOSPITAL_COMMUNITY): Payer: Medicare Other

## 2020-01-03 DIAGNOSIS — I6782 Cerebral ischemia: Secondary | ICD-10-CM | POA: Diagnosis not present

## 2020-01-03 DIAGNOSIS — I255 Ischemic cardiomyopathy: Secondary | ICD-10-CM | POA: Diagnosis not present

## 2020-01-03 DIAGNOSIS — H532 Diplopia: Secondary | ICD-10-CM | POA: Diagnosis not present

## 2020-01-03 DIAGNOSIS — H538 Other visual disturbances: Secondary | ICD-10-CM | POA: Diagnosis not present

## 2020-01-03 DIAGNOSIS — I6330 Cerebral infarction due to thrombosis of unspecified cerebral artery: Secondary | ICD-10-CM | POA: Diagnosis not present

## 2020-01-03 DIAGNOSIS — Z95810 Presence of automatic (implantable) cardiac defibrillator: Secondary | ICD-10-CM | POA: Diagnosis not present

## 2020-01-03 DIAGNOSIS — I6503 Occlusion and stenosis of bilateral vertebral arteries: Secondary | ICD-10-CM | POA: Diagnosis not present

## 2020-01-03 DIAGNOSIS — I10 Essential (primary) hypertension: Secondary | ICD-10-CM | POA: Diagnosis not present

## 2020-01-03 DIAGNOSIS — I672 Cerebral atherosclerosis: Secondary | ICD-10-CM | POA: Diagnosis not present

## 2020-01-03 DIAGNOSIS — G9389 Other specified disorders of brain: Secondary | ICD-10-CM | POA: Diagnosis not present

## 2020-01-03 LAB — THYROID ANTIBODIES
Thyroglobulin Antibody: <1 IU/mL (ref 0.0–0.9)
Thyroperoxidase Ab SerPl-aCnc: <8 IU/mL (ref 0–34)

## 2020-01-03 LAB — RPR: RPR Ser Ql: NONREACTIVE

## 2020-01-03 MED ORDER — ATORVASTATIN CALCIUM 20 MG PO TABS
20.0000 mg | ORAL_TABLET | Freq: Every day | ORAL | 0 refills | Status: DC
Start: 2020-01-04 — End: 2020-02-28

## 2020-01-03 MED ORDER — IOHEXOL 350 MG/ML SOLN
80.0000 mL | Freq: Once | INTRAVENOUS | Status: AC | PRN
  Administered 2020-01-03: 80 mL via INTRAVENOUS

## 2020-01-03 MED ORDER — ASPIRIN 81 MG PO TBEC: 81.0000 mg | DELAYED_RELEASE_TABLET | Freq: Every day | ORAL | 0 refills | Status: AC

## 2020-01-03 MED ORDER — BENAZEPRIL HCL 20 MG PO TABS
20.0000 mg | ORAL_TABLET | Freq: Every day | ORAL | 3 refills | Status: DC
Start: 2020-01-03 — End: 2021-01-06

## 2020-01-03 MED ORDER — CLOPIDOGREL BISULFATE 75 MG PO TABS
75.0000 mg | ORAL_TABLET | Freq: Every day | ORAL | 0 refills | Status: DC
Start: 2020-01-04 — End: 2020-02-28

## 2020-01-03 NOTE — Care Management Obs Status (Signed)
[employer] NOTIFICATION   Patient Details  Name: Daniel [name]. MRN: [id number] Date of Birth: February 11, 1945   Medicare Observation Status Notification Given:  Yes    Marilu Favre, RN 01/03/2020, 10:25 AM

## 2020-01-03 NOTE — Telephone Encounter (Signed)
Noted: transth echo unchanged and CTA head and neck neg for any acute finding.

## 2020-01-03 NOTE — Progress Notes (Signed)
PT Cancellation Note  Patient Details Name: Daniel [name]. MRN: [id number] DOB: 1945-04-07   Cancelled Treatment:    Reason Eval/Treat Not Completed: PT screened, no needs identified, will sign off Per OT, Hillary, pt is functioning at independent level and scored 24/24 on DGI indicating no fall risk. Pt does not require skilled therapy evaluation due to functioning at baseline. Screened and will sign off. Thanks.  Please re consult if anything changes.    Marguarite Arbour A Tammey Deeg 01/03/2020, 12:03 PM Marisa Severin, PT, DPT Acute Rehabilitation Services Pager [phone] Office [phone]

## 2020-01-03 NOTE — Progress Notes (Signed)
STROKE TEAM PROGRESS NOTE   INTERVAL HISTORY No family at bedside.  Patient sitting up in bed.  No acute changes, no acute event overnight.  Still has vertical diplopia except left upper gaze.  CT head and neck unremarkable.  Vitals:   01/02/20 2007 01/03/20 0110 01/03/20 0456 01/03/20 0738  BP: (!) 149/93 126/76 (!) 144/97 132/78  Pulse: 76 70 73 73  Resp: 19 17 18 16   Temp: 97.7 F (36.5 C) 98.7 F (37.1 C) 97.8 F (36.6 C) 97.7 F (36.5 C)  TempSrc: Oral Oral Oral Oral  SpO2: 98% 98% 97% 97%  Weight:      Height:       CBC:  Recent Labs  Lab 01/01/20 1639 01/02/20 0446  WBC 5.2 5.1  NEUTROABS 3.3  --   HGB 16.5 15.3  HCT 48.2 45.1  MCV 94.9 95.8  PLT 160 240   Basic Metabolic Panel:  Recent Labs  Lab 01/01/20 1639 01/02/20 0446  NA 137  --   K 3.9  --   CL 104  --   CO2 24  --   GLUCOSE 151*  --   BUN 17  --   CREATININE 0.90 0.84  CALCIUM 9.7  --    Lipid Panel:  Recent Labs  Lab 01/02/20 0446  CHOL 159  TRIG 45  HDL 39*  CHOLHDL 4.1  VLDL 9  LDLCALC 111*   HgbA1c:  Recent Labs  Lab 01/02/20 0446  HGBA1C 5.2   Urine Drug Screen: No results for input(s): LABOPIA, COCAINSCRNUR, LABBENZ, AMPHETMU, THCU, LABBARB in the last 168 hours.  Alcohol Level No results for input(s): ETH in the last 168 hours.  IMAGING past 24 hours CT ANGIO HEAD W OR WO CONTRAST  Result Date: 01/03/2020 CLINICAL DATA:  Change in vision. EXAM: CT ANGIOGRAPHY HEAD AND NECK TECHNIQUE: Multidetector CT imaging of the head and neck was performed using the standard protocol during bolus administration of intravenous contrast. Multiplanar CT image reconstructions and MIPs were obtained to evaluate the vascular anatomy. Carotid stenosis measurements (when applicable) are obtained utilizing NASCET criteria, using the distal internal carotid diameter as the denominator. CONTRAST:  15mL OMNIPAQUE IOHEXOL 350 MG/ML SOLN COMPARISON:  Head CT from 2 days ago FINDINGS: CT HEAD FINDINGS  Brain: No evidence of acute infarction, hemorrhage, hydrocephalus, extra-axial collection or mass lesion/mass effect. Chronic small vessel ischemic gliosis in the deep cerebral white matter, moderate. Normal brain volume Vascular: See below Skull: Normal. Negative for fracture or focal lesion. Metallic foreign body in the subcutaneous left paramedian forehead. Sinuses: Clear Orbits: Negative Review of the MIP images confirms the above findings CTA NECK FINDINGS Aortic arch: Atheromatous calcification.  Two vessel branching. Right carotid system: Moderate mainly calcified plaque at the bifurcation without flow limiting stenosis or ulceration. Congenitally low bifurcation. Left carotid system: Moderate mixed density plaque at the bifurcation without flow limiting stenosis or ulceration. Mild undulation of the luminal contour without convincing beading or fibromuscular dysplasia. Congenitally low bifurcation. Vertebral arteries: No proximal subclavian stenosis. The right vertebral artery is dominant. There are a few areas of extrinsic mild narrowing of the right vertebral artery due to degenerative spurs. No atheromatous stenosis, dissection, or beading. Skeleton: Generalized cervical spine degeneration. Other neck: Negative Upper chest: Emphysema Review of the MIP images confirms the above findings CTA HEAD FINDINGS Anterior circulation: A right MCA branche is replaced to the right A1 segment. Generally attenuated appearance of the vessels, symmetric and likely patient's baseline. There is atheromatous calcification  of the carotid siphons without stenosis or ulceration seen. Negative for aneurysm. Posterior circulation: Vertebral and basilar arteries are smoothly contoured and diffusely patent. Hypoplastic right P1 segment. No branch occlusion, beading, or aneurysm. Venous sinuses: Unremarkable in the arterial phase Anatomic variants: As above Review of the MIP images confirms the above findings IMPRESSION: 1. No  emergent finding. 2. Atherosclerosis without flow limiting stenosis of major vessels. 3. Moderate chronic small vessel ischemia in the cerebral white matter. 4. Aortic Atherosclerosis (ICD10-I70.0) and Emphysema (ICD10-J43.9). Electronically Signed   By: Monte Fantasia M.D.   On: 01/03/2020 06:28   CT ANGIO NECK W OR WO CONTRAST  Result Date: 01/03/2020 CLINICAL DATA:  Change in vision. EXAM: CT ANGIOGRAPHY HEAD AND NECK TECHNIQUE: Multidetector CT imaging of the head and neck was performed using the standard protocol during bolus administration of intravenous contrast. Multiplanar CT image reconstructions and MIPs were obtained to evaluate the vascular anatomy. Carotid stenosis measurements (when applicable) are obtained utilizing NASCET criteria, using the distal internal carotid diameter as the denominator. CONTRAST:  21mL OMNIPAQUE IOHEXOL 350 MG/ML SOLN COMPARISON:  Head CT from 2 days ago FINDINGS: CT HEAD FINDINGS Brain: No evidence of acute infarction, hemorrhage, hydrocephalus, extra-axial collection or mass lesion/mass effect. Chronic small vessel ischemic gliosis in the deep cerebral white matter, moderate. Normal brain volume Vascular: See below Skull: Normal. Negative for fracture or focal lesion. Metallic foreign body in the subcutaneous left paramedian forehead. Sinuses: Clear Orbits: Negative Review of the MIP images confirms the above findings CTA NECK FINDINGS Aortic arch: Atheromatous calcification.  Two vessel branching. Right carotid system: Moderate mainly calcified plaque at the bifurcation without flow limiting stenosis or ulceration. Congenitally low bifurcation. Left carotid system: Moderate mixed density plaque at the bifurcation without flow limiting stenosis or ulceration. Mild undulation of the luminal contour without convincing beading or fibromuscular dysplasia. Congenitally low bifurcation. Vertebral arteries: No proximal subclavian stenosis. The right vertebral artery is  dominant. There are a few areas of extrinsic mild narrowing of the right vertebral artery due to degenerative spurs. No atheromatous stenosis, dissection, or beading. Skeleton: Generalized cervical spine degeneration. Other neck: Negative Upper chest: Emphysema Review of the MIP images confirms the above findings CTA HEAD FINDINGS Anterior circulation: A right MCA branche is replaced to the right A1 segment. Generally attenuated appearance of the vessels, symmetric and likely patient's baseline. There is atheromatous calcification of the carotid siphons without stenosis or ulceration seen. Negative for aneurysm. Posterior circulation: Vertebral and basilar arteries are smoothly contoured and diffusely patent. Hypoplastic right P1 segment. No branch occlusion, beading, or aneurysm. Venous sinuses: Unremarkable in the arterial phase Anatomic variants: As above Review of the MIP images confirms the above findings IMPRESSION: 1. No emergent finding. 2. Atherosclerosis without flow limiting stenosis of major vessels. 3. Moderate chronic small vessel ischemia in the cerebral white matter. 4. Aortic Atherosclerosis (ICD10-I70.0) and Emphysema (ICD10-J43.9). Electronically Signed   By: Monte Fantasia M.D.   On: 01/03/2020 06:28   ECHOCARDIOGRAM COMPLETE  Result Date: 01/02/2020    ECHOCARDIOGRAM REPORT   Patient Name:   Daniel [name]. Date of Exam: 01/02/2020 Medical Rec #:  [id number]        Height:       70.0 in Accession #:    [id number]       Weight:       161.0 lb Date of Birth:  Mar 14, 1945         BSA:  1.903 m Patient Age:    34 years         BP:           153/95 mmHg Patient Gender: M                HR:           75 bpm. Exam Location:  Inpatient Procedure: 2D Echo, Cardiac Doppler, Color Doppler and Intracardiac            Opacification Agent Indications:    CVA  History:        Patient has prior history of Echocardiogram examinations, most                 recent 08/07/2017. CHF and Cardiomyopathy, CAD  and Previous                 Myocardial Infarction, Prior CABG and Defibrillator, COPD and                 Stroke, Arrythmias:Atrial Fibrillation; Risk                 Factors:Hypertension and Current Smoker.  Sonographer:    Dustin Flock Referring Phys: Bates  1. Global hypokinesis with akinesis of the inferolateral wall; overall severe LV dysfunction; no LV thrombus noted using definity.  2. Left ventricular ejection fraction, by estimation, is 20 to 25%. The left ventricle has severely decreased function. The left ventricle demonstrates regional wall motion abnormalities (see scoring diagram/findings for description). The left ventricular internal cavity size was moderately dilated. Left ventricular diastolic parameters are indeterminate.  3. Right ventricular systolic function is normal. The right ventricular size is normal.  4. Left atrial size was mildly dilated.  5. The mitral valve is normal in structure. No evidence of mitral valve regurgitation. No evidence of mitral stenosis.  6. The aortic valve is tricuspid. Aortic valve regurgitation is mild. Mild aortic valve sclerosis is present, with no evidence of aortic valve stenosis.  7. Aortic dilatation noted. There is mild dilatation of the aortic root, measuring 40 mm.  8. The inferior vena cava is normal in size with greater than 50% respiratory variability, suggesting right atrial pressure of 3 mmHg. FINDINGS  Left Ventricle: Left ventricular ejection fraction, by estimation, is 20 to 25%. The left ventricle has severely decreased function. The left ventricle demonstrates regional wall motion abnormalities. Definity contrast agent was given IV to delineate the left ventricular endocardial borders. The left ventricular internal cavity size was moderately dilated. There is no left ventricular hypertrophy. Left ventricular diastolic parameters are indeterminate. Right Ventricle: The right ventricular size is normal.Right  ventricular systolic function is normal. Left Atrium: Left atrial size was mildly dilated. Right Atrium: Right atrial size was normal in size. Pericardium: There is no evidence of pericardial effusion. Mitral Valve: The mitral valve is normal in structure. No evidence of mitral valve regurgitation. No evidence of mitral valve stenosis. Tricuspid Valve: The tricuspid valve is normal in structure. Tricuspid valve regurgitation is not demonstrated. No evidence of tricuspid stenosis. Aortic Valve: The aortic valve is tricuspid. Aortic valve regurgitation is mild. Aortic regurgitation PHT measures 520 msec. Mild aortic valve sclerosis is present, with no evidence of aortic valve stenosis. Pulmonic Valve: The pulmonic valve was not well visualized. Pulmonic valve regurgitation is not visualized. No evidence of pulmonic stenosis. Aorta: Aortic dilatation noted. There is mild dilatation of the aortic root, measuring 40 mm. Venous: The inferior vena cava is  normal in size with greater than 50% respiratory variability, suggesting right atrial pressure of 3 mmHg. IAS/Shunts: No atrial level shunt detected by color flow Doppler. Additional Comments: Global hypokinesis with akinesis of the inferolateral wall; overall severe LV dysfunction; no LV thrombus noted using definity. A pacer wire is visualized.  LEFT VENTRICLE PLAX 2D LVIDd:         5.90 cm  Diastology LVIDs:         5.10 cm  LV e' medial:    3.26 cm/s LV PW:         1.10 cm  LV E/e' medial:  13.1 LV IVS:        1.10 cm  LV e' lateral:   10.20 cm/s LVOT diam:     2.60 cm  LV E/e' lateral: 4.2 LV SV:         70 LV SV Index:   37 LVOT Area:     5.31 cm  RIGHT VENTRICLE RV Basal diam:  3.00 cm RV S prime:     7.62 cm/s TAPSE (M-mode): 2.2 cm LEFT ATRIUM           Index       RIGHT ATRIUM           Index LA diam:      4.40 cm 2.31 cm/m  RA Area:     13.30 cm LA Vol (A2C): 64.6 ml 33.94 ml/m RA Volume:   30.40 ml  15.97 ml/m LA Vol (A4C): 45.4 ml 23.85 ml/m  AORTIC  VALVE LVOT Vmax:   78.30 cm/s LVOT Vmean:  48.500 cm/s LVOT VTI:    0.132 m AI PHT:      520 msec  AORTA Ao Root diam: 4.00 cm MITRAL VALVE MV Area (PHT): 4.60 cm     SHUNTS MV Decel Time: 165 msec     Systemic VTI:  0.13 m MV E velocity: 42.70 cm/s   Systemic Diam: 2.60 cm MV A velocity: 101.00 cm/s MV E/A ratio:  0.42 Kirk Ruths MD Electronically signed by Kirk Ruths MD Signature Date/Time: 01/02/2020/2:09:47 PM    Final     PHYSICAL EXAM    Temp:  [97.7 F (36.5 C)-98.7 F (37.1 C)] 97.7 F (36.5 C) (12/03 0738) Pulse Rate:  [70-76] 73 (12/03 0738) Resp:  [15-19] 16 (12/03 0738) BP: (126-169)/(76-107) 132/78 (12/03 0738) SpO2:  [97 %-100 %] 97 % (12/03 0738)  General - Well nourished, well developed, in no apparent distress.  Ophthalmologic - fundi not visualized due to noncooperation.  Cardiovascular - Regular rhythm and rate.  Mental Status -  Level of arousal and orientation to time, place, and person were intact. Language including expression, naming, repetition, comprehension was assessed and found intact. Fund of Knowledge was assessed and was intact.  Cranial Nerves II - XII - II - Visual field intact OU. III, IV, VI - Extraocular movements intact.  However, no diplopia only with the right upper gaze, indicating left CN IV mild palsy. V - Facial sensation intact bilaterally. VII - Facial movement intact bilaterally. VIII - Hearing & vestibular intact bilaterally. X - Palate elevates symmetrically. XI - Chin turning & shoulder shrug intact bilaterally. XII - Tongue protrusion intact.  Motor Strength - [name] strength was normal in all extremities and pronator drift was absent.  Bulk was normal and fasciculations were absent.   Motor Tone - Muscle tone was assessed at the neck and appendages and was normal.  Reflexes - [name] reflexes were  symmetrical in all extremities and he had no pathological reflexes.  Sensory - Light touch,  temperature/pinprick were assessed and were symmetrical.    Coordination - The patient had normal movements in the hands and feet with no ataxia or dysmetria.  Tremor was absent.  Gait and Station - deferred.   ASSESSMENT/PLAN [name]. is a 74 y.o. 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   CTA head and neck with preparation NO LVO. Atherosclerosis. Moderate small vessel disease. Aortic atherosclerosis. Emphysema.    Not able to have MRI due to shrapnel in the forehead  2D Echo EF 20-25%  LDL 111  HgbA1c 5.2  VTE prophylaxis - Lovenox 40 mg sq daily   aspirin 81 mg daily prior to admission, now on aspirin 81 mg daily and plavix DAPT. Continue DAPT x 3 weeks then plavix alone    Therapy recommendations:  No PT, no OT, supervision   Disposition:  Return home  Patient advised not to drive  Hx Atrial Fibrillation  Home anticoagulation:  none   07/2011 - seen in ED w/ AF RVR where he spontaneously converted. Only 1 known long episode.CHADS score was 1, decided not to anticoagulate ([name])  05/2016 - NSTEMI, CABG. Developed AF POD#4. Converted to NSR on amio. Pt self d/c'd amio d/t dyspnea. Continue BB. Consider AC w/ AF recurrence  10/2019 - Pacer shows < 1 % burden with 0 episodes of AF during download  Pt reports palpitations at times  Pacer interrogation this admission showed no A. fib, only  one episode of atrial tachycardia.  Now on DAPT  Follow up with Dr. Lovena Le   Cardiomyopathy  Chronic systolic heart failure, EF 20%  Status post ICD insertion 04/2019  Followed by Dr. Lovena Le  On aspirin 81 at home  Not able tolerating Entresto due to rash  This admission EF stable 20 to 25%.  Hypertension  Stable . Long-term BP goal normotensive  Hyperlipidemia  Home meds:  Omega 3  Treated w/ statins in the past - took lipitor 80 after MI/CABG in 05/2016 - in ~ Aug changed to fish oil without explanation - per pt due to prostate issue. Now given he has had proctectomy, will start low dose statin (no intensive statin)   LDL 111, goal < 70  Start lipitor 20   Continue statin at discharge  Other Stroke Risk Factors  Advanced Age >/= 49   Former Cigarette smoker  Coronary artery disease, NSTEMI, CABG 05/2016  Other Active Problems  Hx prostate cancer s/p Sx  Thyroid antibodies negative  Acetylcholine receptor antibody and MuSK antibody pending to rule out [facility] day # 0  Neurology will sign off. Please call with questions. Pt will follow up with stroke clinic NP at [facility] in about 4 weeks. Thanks for the consult.   Rosalin Hawking, MD PhD Stroke Neurology 01/03/2020 10:22 AM    To contact Stroke Continuity provider, please refer to [email or web address] After hours, contact General Neurology

## 2020-01-04 ENCOUNTER — Encounter: Payer: Self-pay | Admitting: Family Medicine

## 2020-01-04 NOTE — Discharge Summary (Signed)
Physician Discharge Summary  Daniel [name]. ZOX:[id number] DOB: 1945/08/10 DOA: 01/01/2020  PCP: Daniel Sou, MD  Admit date: 01/01/2020 Discharge date: 01/04/2020  Recommendations for Outpatient Follow-up:  Discharge to home Follow up with PCP in 7-10 days. Pt needs referral to ophthalmology from PCP. Follow up with Stroke Clinic as directed by neurology. No driving.   Follow-up Information    [facility] Follow up in 4 week(s).   Specialty: Neurology Why: stroke clinic. office will call with appt date and time Contact information: [address] [place] [phone]             Discharge Diagnoses: Principal diagnosis is #1 1. 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 DAPT. 2. Cardiomyopathy/Chronic systolic CHF with EF 56-21%. Stable s/p AICD placement. 3. History of atrial fibrillation. No AC due to CHADS score 1 and only one known episode.  4. Hypertension 5. Hyperlipidemia 6. Concern for MG. Acetylcholine receptor antibody and MuSK antibody pending to rule out MG.  Discharge Condition: Fair  Disposition: Home  Diet recommendation: Heart healthy  Filed Weights   01/01/20 1624  Weight: 73 kg    History of present illness: Daniel [name]. is a 74 y.o. male with history of chronic systolic heart failure status post ICD placement and pacemaker, history of CAD status post CABG, hypertension, COPD history of A. fib briefly after CABG presents to the ER after patient was referred by patient's ophthalmologist with concern for possible stroke.  Patient has been having diplopia since 3 PM on December 31, 2019.  Patient went to check with his ophthalmologist on January 01, 2020 and was referred to the ER at [facility].  Denies any difficulty swallowing speaking or weakness of the upper or lower extremities.  Patient states when he closes his 1 eye the diplopia  disappears.  No facial asymmetry.  ED Course: In the ER CT head was showing age-indeterminate vascular changes.  Patient was transferred from [facility] to [facility] for MRI.  But since patient has ICD will need technician to adjust ICD before getting MRI.  Neurologist on-call was consulted.  Patient did pass swallow swallow.  Labs are largely unremarkable.  Covid test is negative.  Hospital Course: The patient was admitted to a telemetry bed. Neurology has been consulted. MRI was ordered, but had to be cancelled due to shrapnel that the patient has in his forehead. CTA head and neck demonstrated no emergent fidings, atherosclerosis without flow limiting stenosis of major vessels, and moderate chronic small vessel ischemia in the cerebral white matter.  Echocardiogram was performed. It demonstrated. EF of 20-25% with severely decreased function of the LV. There are also regional wall motion abnormalities.RV is normal.   The patient has been cleared by neurology for discharge to home. He has been evaluated by OT. They have given the patient a patch and have made a recommendation for the patient to follow up with ophthalmology to obtain "prism" glasses on discharge to help with the diplopia.  Today's assessment: S: The patient is resting comfortably. No new complaints. O: Vitals:  Vitals:   01/03/20 0738 01/03/20 1058  BP: 132/78 (!) 145/95  Pulse: 73 75  Resp: 16 16  Temp: 97.7 F (36.5 C) 97.6 F (36.4 C)  SpO2: 97% 98%   Exam:  Constitutional:  . The patient is awake, alert, and oriented x 3. No acute  distress. Eyes:  . pupils and irises appear normal . Normal lids and conjunctivae . Pt has a piece of tape over the lens of his left eye. This helps with diplopia. Respiratory:  . No increased work of breathing. . No wheezes, rales, or rhonchi . No tactile fremitus Cardiovascular:  . Regular rate and rhythm . No murmurs, ectopy, or gallups. . No lateral PMI. No  thrills. Abdomen:  . Abdomen is soft, non-tender, non-distended . No hernias, masses, or organomegaly . Normoactive bowel sounds.  Musculoskeletal:  . No cyanosis, clubbing, or edema Skin:  . No rashes, lesions, ulcers . palpation of skin: no induration or nodules Neurologic:  . CN 2-12 intact . Sensation all 4 extremities intact  Discharge Instructions  Discharge Instructions    Activity as tolerated - No restrictions   Complete by: As directed    Ambulatory referral to Neurology   Complete by: As directed    Follow up in stroke clinic at [facility] with Frann Rider, NP in about 4 weeks. If not available, consider Dr. Antony Contras, Dr. Bess Harvest, or Dr. Sarina Ill.   Call MD for:   Complete by: As directed    Neurological changes.   Diet - low sodium heart healthy   Complete by: As directed    Discharge instructions   Complete by: As directed    Discharge to home Follow up with PCP in 7-10 days. Pt needs referral to ophthalmology from PCP. Follow up with Stroke Clinic as directed by neurology. No driving.   Increase activity slowly   Complete by: As directed      Allergies as of 01/03/2020      Reactions   Entresto [sacubitril-valsartan] Hives, Shortness Of Breath   Amlodipine Swelling   LE swelling   Contrast Media [iodinated Diagnostic Agents] Other (See Comments)   Prostate problem had to wear a catheter for two weeks after having dye.    Hydralazine Palpitations, Other (See Comments)   Fatigue+      Medication List    STOP taking these medications   BENADRYL ALLERGY PO   furosemide 20 MG tablet Commonly known as: LASIX   spironolactone 25 MG tablet Commonly known as: ALDACTONE     TAKE these medications   ALPRAZolam 0.25 MG tablet Commonly known as: XANAX TAKE ONE TABLET BY MOUTH AT BEDTIME AS NEEDED What changed: reasons to take this   aspirin 81 MG EC tablet Take 1 tablet (81 mg total) by mouth daily for 21 days.  Swallow whole. What changed: additional instructions   atorvastatin 20 MG tablet Commonly known as: LIPITOR Take 1 tablet (20 mg total) by mouth daily.   benazepril 20 MG tablet Commonly known as: LOTENSIN Take 1 tablet (20 mg total) by mouth daily. 1 tab po bid What changed:   how much to take  how to take this  when to take this   clopidogrel 75 MG tablet Commonly known as: PLAVIX Take 1 tablet (75 mg total) by mouth daily.   Fish Oil 1000 MG Caps Take 1,000 mg by mouth daily.   Lubricant Eye Drops 0.4-0.3 % Soln Generic drug: Polyethyl Glycol-Propyl Glycol Place 1 drop into both eyes 3 (three) times daily as needed (dry/irritated eyes.).   Vitamin D (Cholecalciferol) 10 MCG (400 UNIT) Caps Take 400 Units by mouth daily.      Allergies  Allergen Reactions  . Entresto [Sacubitril-Valsartan] Hives and Shortness Of Breath  . Amlodipine Swelling    LE  swelling  . Contrast Media [Iodinated Diagnostic Agents] Other (See Comments)    Prostate problem had to wear a catheter for two weeks after having dye.   [name] Hydralazine Palpitations and Other (See Comments)    Fatigue+    The results of significant diagnostics from this hospitalization (including imaging, microbiology, ancillary and laboratory) are listed below for reference.    Significant Diagnostic Studies: CT ANGIO HEAD W OR WO CONTRAST  Result Date: 01/03/2020 CLINICAL DATA:  Change in vision. EXAM: CT ANGIOGRAPHY HEAD AND NECK TECHNIQUE: Multidetector CT imaging of the head and neck was performed using the standard protocol during bolus administration of intravenous contrast. Multiplanar CT image reconstructions and MIPs were obtained to evaluate the vascular anatomy. Carotid stenosis measurements (when applicable) are obtained utilizing NASCET criteria, using the distal internal carotid diameter as the denominator. CONTRAST:  47mL OMNIPAQUE IOHEXOL 350 MG/ML SOLN COMPARISON:  Head CT from 2 days ago FINDINGS: CT  HEAD FINDINGS Brain: No evidence of acute infarction, hemorrhage, hydrocephalus, extra-axial collection or mass lesion/mass effect. Chronic small vessel ischemic gliosis in the deep cerebral white matter, moderate. Normal brain volume Vascular: See below Skull: Normal. Negative for fracture or focal lesion. Metallic foreign body in the subcutaneous left paramedian forehead. Sinuses: Clear Orbits: Negative Review of the MIP images confirms the above findings CTA NECK FINDINGS Aortic arch: Atheromatous calcification.  Two vessel branching. Right carotid system: Moderate mainly calcified plaque at the bifurcation without flow limiting stenosis or ulceration. Congenitally low bifurcation. Left carotid system: Moderate mixed density plaque at the bifurcation without flow limiting stenosis or ulceration. Mild undulation of the luminal contour without convincing beading or fibromuscular dysplasia. Congenitally low bifurcation. Vertebral arteries: No proximal subclavian stenosis. The right vertebral artery is dominant. There are a few areas of extrinsic mild narrowing of the right vertebral artery due to degenerative spurs. No atheromatous stenosis, dissection, or beading. Skeleton: Generalized cervical spine degeneration. Other neck: Negative Upper chest: Emphysema Review of the MIP images confirms the above findings CTA HEAD FINDINGS Anterior circulation: A right MCA branche is replaced to the right A1 segment. Generally attenuated appearance of the vessels, symmetric and likely patient's baseline. There is atheromatous calcification of the carotid siphons without stenosis or ulceration seen. Negative for aneurysm. Posterior circulation: Vertebral and basilar arteries are smoothly contoured and diffusely patent. Hypoplastic right P1 segment. No branch occlusion, beading, or aneurysm. Venous sinuses: Unremarkable in the arterial phase Anatomic variants: As above Review of the MIP images confirms the above findings  IMPRESSION: 1. No emergent finding. 2. Atherosclerosis without flow limiting stenosis of major vessels. 3. Moderate chronic small vessel ischemia in the cerebral white matter. 4. Aortic Atherosclerosis (ICD10-I70.0) and Emphysema (ICD10-J43.9). Electronically Signed   By: Monte Fantasia M.D.   On: 01/03/2020 06:28   CT Head Wo Contrast  Result Date: 01/01/2020 CLINICAL DATA:  Change in vision since 1 p.m. yesterday, hypertension, COPD EXAM: CT HEAD WITHOUT CONTRAST TECHNIQUE: Contiguous axial images were obtained from the base of the skull through the vertex without intravenous contrast. COMPARISON:  None. FINDINGS: Brain: Scattered hypodensities throughout the bilateral periventricular white matter are noted, consistent with age-indeterminate small vessel ischemic changes. Focal hypodensity within the right basal ganglia consistent with age-indeterminate lacunar infarct. No other signs of acute infarct or hemorrhage. The lateral ventricles and midline structures are unremarkable. No acute extra-axial fluid collections. No mass effect. Vascular: No hyperdense vessel or unexpected calcification. Skull: Normal. Negative for fracture or focal lesion. Sinuses/Orbits: No acute finding. Other: None.  IMPRESSION: 1. Bilateral periventricular white matter hypodensities and right basal ganglia hypodensities, consistent with age-indeterminate small vessel ischemic changes. 2. No acute intracranial hemorrhage. Electronically Signed   By: Randa Ngo M.D.   On: 01/01/2020 17:07   CT ANGIO NECK W OR WO CONTRAST  Result Date: 01/03/2020 CLINICAL DATA:  Change in vision. EXAM: CT ANGIOGRAPHY HEAD AND NECK TECHNIQUE: Multidetector CT imaging of the head and neck was performed using the standard protocol during bolus administration of intravenous contrast. Multiplanar CT image reconstructions and MIPs were obtained to evaluate the vascular anatomy. Carotid stenosis measurements (when applicable) are obtained utilizing  NASCET criteria, using the distal internal carotid diameter as the denominator. CONTRAST:  56mL OMNIPAQUE IOHEXOL 350 MG/ML SOLN COMPARISON:  Head CT from 2 days ago FINDINGS: CT HEAD FINDINGS Brain: No evidence of acute infarction, hemorrhage, hydrocephalus, extra-axial collection or mass lesion/mass effect. Chronic small vessel ischemic gliosis in the deep cerebral white matter, moderate. Normal brain volume Vascular: See below Skull: Normal. Negative for fracture or focal lesion. Metallic foreign body in the subcutaneous left paramedian forehead. Sinuses: Clear Orbits: Negative Review of the MIP images confirms the above findings CTA NECK FINDINGS Aortic arch: Atheromatous calcification.  Two vessel branching. Right carotid system: Moderate mainly calcified plaque at the bifurcation without flow limiting stenosis or ulceration. Congenitally low bifurcation. Left carotid system: Moderate mixed density plaque at the bifurcation without flow limiting stenosis or ulceration. Mild undulation of the luminal contour without convincing beading or fibromuscular dysplasia. Congenitally low bifurcation. Vertebral arteries: No proximal subclavian stenosis. The right vertebral artery is dominant. There are a few areas of extrinsic mild narrowing of the right vertebral artery due to degenerative spurs. No atheromatous stenosis, dissection, or beading. Skeleton: Generalized cervical spine degeneration. Other neck: Negative Upper chest: Emphysema Review of the MIP images confirms the above findings CTA HEAD FINDINGS Anterior circulation: A right MCA branche is replaced to the right A1 segment. Generally attenuated appearance of the vessels, symmetric and likely patient's baseline. There is atheromatous calcification of the carotid siphons without stenosis or ulceration seen. Negative for aneurysm. Posterior circulation: Vertebral and basilar arteries are smoothly contoured and diffusely patent. Hypoplastic right P1 segment. No  branch occlusion, beading, or aneurysm. Venous sinuses: Unremarkable in the arterial phase Anatomic variants: As above Review of the MIP images confirms the above findings IMPRESSION: 1. No emergent finding. 2. Atherosclerosis without flow limiting stenosis of major vessels. 3. Moderate chronic small vessel ischemia in the cerebral white matter. 4. Aortic Atherosclerosis (ICD10-I70.0) and Emphysema (ICD10-J43.9). Electronically Signed   By: Monte Fantasia M.D.   On: 01/03/2020 06:28   ECHOCARDIOGRAM COMPLETE  Result Date: 01/02/2020    ECHOCARDIOGRAM REPORT   Patient Name:   Daniel [name]. Date of Exam: 01/02/2020 Medical Rec #:  [id number]        Height:       70.0 in Accession #:    [id number]       Weight:       161.0 lb Date of Birth:  1945-06-10         BSA:          1.903 m Patient Age:    58 years         BP:           153/95 mmHg Patient Gender: M                HR:           75  bpm. Exam Location:  Inpatient Procedure: 2D Echo, Cardiac Doppler, Color Doppler and Intracardiac            Opacification Agent Indications:    CVA  History:        Patient has prior history of Echocardiogram examinations, most                 recent 08/07/2017. CHF and Cardiomyopathy, CAD and Previous                 Myocardial Infarction, Prior CABG and Defibrillator, COPD and                 Stroke, Arrythmias:Atrial Fibrillation; Risk                 Factors:Hypertension and Current Smoker.  Sonographer:    Dustin Flock Referring Phys: [place]  1. Global hypokinesis with akinesis of the inferolateral wall; overall severe LV dysfunction; no LV thrombus noted using definity.  2. Left ventricular ejection fraction, by estimation, is 20 to 25%. The left ventricle has severely decreased function. The left ventricle demonstrates regional wall motion abnormalities (see scoring diagram/findings for description). The left ventricular internal cavity size was moderately dilated. Left ventricular  diastolic parameters are indeterminate.  3. Right ventricular systolic function is normal. The right ventricular size is normal.  4. Left atrial size was mildly dilated.  5. The mitral valve is normal in structure. No evidence of mitral valve regurgitation. No evidence of mitral stenosis.  6. The aortic valve is tricuspid. Aortic valve regurgitation is mild. Mild aortic valve sclerosis is present, with no evidence of aortic valve stenosis.  7. Aortic dilatation noted. There is mild dilatation of the aortic root, measuring 40 mm.  8. The inferior vena cava is normal in size with greater than 50% respiratory variability, suggesting right atrial pressure of 3 mmHg. FINDINGS  Left Ventricle: Left ventricular ejection fraction, by estimation, is 20 to 25%. The left ventricle has severely decreased function. The left ventricle demonstrates regional wall motion abnormalities. Definity contrast agent was given IV to delineate the left ventricular endocardial borders. The left ventricular internal cavity size was moderately dilated. There is no left ventricular hypertrophy. Left ventricular diastolic parameters are indeterminate. Right Ventricle: The right ventricular size is normal.Right ventricular systolic function is normal. Left Atrium: Left atrial size was mildly dilated. Right Atrium: Right atrial size was normal in size. Pericardium: There is no evidence of pericardial effusion. Mitral Valve: The mitral valve is normal in structure. No evidence of mitral valve regurgitation. No evidence of mitral valve stenosis. Tricuspid Valve: The tricuspid valve is normal in structure. Tricuspid valve regurgitation is not demonstrated. No evidence of tricuspid stenosis. Aortic Valve: The aortic valve is tricuspid. Aortic valve regurgitation is mild. Aortic regurgitation PHT measures 520 msec. Mild aortic valve sclerosis is present, with no evidence of aortic valve stenosis. Pulmonic Valve: The pulmonic valve was not well  visualized. Pulmonic valve regurgitation is not visualized. No evidence of pulmonic stenosis. Aorta: Aortic dilatation noted. There is mild dilatation of the aortic root, measuring 40 mm. Venous: The inferior vena cava is normal in size with greater than 50% respiratory variability, suggesting right atrial pressure of 3 mmHg. IAS/Shunts: No atrial level shunt detected by color flow Doppler. Additional Comments: Global hypokinesis with akinesis of the inferolateral wall; overall severe LV dysfunction; no LV thrombus noted using definity. A pacer wire is visualized.  LEFT VENTRICLE PLAX 2D LVIDd:  5.90 cm  Diastology LVIDs:         5.10 cm  LV e' medial:    3.26 cm/s LV PW:         1.10 cm  LV E/e' medial:  13.1 LV IVS:        1.10 cm  LV e' lateral:   10.20 cm/s LVOT diam:     2.60 cm  LV E/e' lateral: 4.2 LV SV:         70 LV SV Index:   37 LVOT Area:     5.31 cm  RIGHT VENTRICLE RV Basal diam:  3.00 cm RV S prime:     7.62 cm/s TAPSE (M-mode): 2.2 cm LEFT ATRIUM           Index       RIGHT ATRIUM           Index LA diam:      4.40 cm 2.31 cm/m  RA Area:     13.30 cm LA Vol (A2C): 64.6 ml 33.94 ml/m RA Volume:   30.40 ml  15.97 ml/m LA Vol (A4C): 45.4 ml 23.85 ml/m  AORTIC VALVE LVOT Vmax:   78.30 cm/s LVOT Vmean:  48.500 cm/s LVOT VTI:    0.132 m AI PHT:      520 msec  AORTA Ao Root diam: 4.00 cm MITRAL VALVE MV Area (PHT): 4.60 cm     SHUNTS MV Decel Time: 165 msec     Systemic VTI:  0.13 m MV E velocity: 42.70 cm/s   Systemic Diam: 2.60 cm MV A velocity: 101.00 cm/s MV E/A ratio:  0.42 Kirk Ruths MD Electronically signed by Kirk Ruths MD Signature Date/Time: 01/02/2020/2:09:47 PM    Final     Microbiology: Recent Results (from the past 240 hour(s))  Resp Panel by RT-PCR (Flu A&B, Covid) Nasopharyngeal Swab     Status: None   Collection Time: 01/02/20 12:12 AM   Specimen: Nasopharyngeal Swab; Nasopharyngeal(NP) swabs in vial transport medium  Result Value Ref Range Status   SARS  Coronavirus 2 by RT PCR NEGATIVE NEGATIVE Final    Comment: (NOTE) SARS-CoV-2 target nucleic acids are NOT DETECTED.  The SARS-CoV-2 RNA is generally detectable in upper respiratory specimens during the acute phase of infection. The lowest concentration of SARS-CoV-2 viral copies this assay can detect is 138 copies/mL. A negative result does not preclude SARS-Cov-2 infection and should not be used as the sole basis for treatment or other patient management decisions. A negative result may occur with  improper specimen collection/handling, submission of specimen other than nasopharyngeal swab, presence of viral mutation(s) within the areas targeted by this assay, and inadequate number of viral copies(<138 copies/mL). A negative result must be combined with clinical observations, patient history, and epidemiological information. The expected result is Negative.  Fact Sheet for Patients:  EntrepreneurPulse.com.au  Fact Sheet for Healthcare Providers:  IncredibleEmployment.be  This test is no t yet approved or cleared by the [place] FDA and  has been authorized for detection and/or diagnosis of SARS-CoV-2 by FDA under an Emergency Use Authorization (EUA). This EUA will remain  in effect (meaning this test can be used) for the duration of the COVID-19 declaration under Section 564(b)(1) of the Act, 21 U.S.C.section 360bbb-3(b)(1), unless the authorization is terminated  or revoked sooner.       Influenza A by PCR NEGATIVE NEGATIVE Final   Influenza B by PCR NEGATIVE NEGATIVE Final    Comment: (NOTE) The Xpert Xpress SARS-CoV-2/FLU/RSV  plus assay is intended as an aid in the diagnosis of influenza from Nasopharyngeal swab specimens and should not be used as a sole basis for treatment. Nasal washings and aspirates are unacceptable for Xpert Xpress SARS-CoV-2/FLU/RSV testing.  Fact Sheet for  Patients: EntrepreneurPulse.com.au  Fact Sheet for Healthcare Providers: IncredibleEmployment.be  This test is not yet approved or cleared by the [place] FDA and has been authorized for detection and/or diagnosis of SARS-CoV-2 by FDA under an Emergency Use Authorization (EUA). This EUA will remain in effect (meaning this test can be used) for the duration of the COVID-19 declaration under Section 564(b)(1) of the Act, 21 U.S.C. section 360bbb-3(b)(1), unless the authorization is terminated or revoked.  Performed at [facility], [place] [address], [place], [place] [address]      Labs: Basic Metabolic Panel: Recent Labs  Lab 01/01/20 1639 01/02/20 0446  NA 137  --   K 3.9  --   CL 104  --   CO2 24  --   GLUCOSE 151*  --   BUN 17  --   CREATININE 0.90 0.84  CALCIUM 9.7  --    Liver Function Tests: Recent Labs  Lab 01/01/20 1639  AST 25  ALT 19  ALKPHOS 74  BILITOT 0.9  PROT 7.0  ALBUMIN 4.4   No results for input(s): LIPASE, AMYLASE in the last 168 hours. No results for input(s): AMMONIA in the last 168 hours. CBC: Recent Labs  Lab 01/01/20 1639 01/02/20 0446  WBC 5.2 5.1  NEUTROABS 3.3  --   HGB 16.5 15.3  HCT 48.2 45.1  MCV 94.9 95.8  PLT 160 153   Cardiac Enzymes: No results for input(s): CKTOTAL, CKMB, CKMBINDEX, TROPONINI in the last 168 hours. BNP: BNP (last 3 results) No results for input(s): BNP in the last 8760 hours.  ProBNP (last 3 results) No results for input(s): PROBNP in the last 8760 hours.  CBG: No results for input(s): GLUCAP in the last 168 hours.  Principal Problem:   Blurred vision Active Problems:   Hypertension   Prostate cancer ([place])   Ischemic cardiomyopathy   Coronary artery disease involving coronary bypass graft of native heart without angina pectoris   ICD (implantable cardioverter-defibrillator) in place   Cerebral thrombosis with cerebral infarction   Time  coordinating discharge: 38 minutes.  Signed:        Domenica Weightman, DO [facility]  01/04/2020, 7:45 AM

## 2020-01-06 ENCOUNTER — Telehealth: Payer: Self-pay

## 2020-01-06 LAB — MUSK ANTIBODIES: MuSK Antibodies: <1 U/mL

## 2020-01-06 NOTE — Telephone Encounter (Signed)
Noted: nurse phone contact with patient for TCM. ° °

## 2020-01-06 NOTE — Telephone Encounter (Signed)
Transition Care Management Follow-up Telephone Call  Date of discharge and from where: 01/03/20-[place]  How have you been since you were released from the hospital? Pretty good  Any questions or concerns? No  Items Reviewed:  Did the pt receive and understand the discharge instructions provided? Yes   Medications obtained and verified? Yes   Other? Yes   Any new allergies since your discharge? No   Dietary orders reviewed? Yes  Do you have support at home? Yes   Home Care and Equipment/Supplies: Were home health services ordered? no If so, what is the name of the agency? n/a  Has the agency set up a time to come to the patient's home? not applicable Were any new equipment or medical supplies ordered?  No What is the name of the medical supply agency? n/a Were you able to get the supplies/equipment? not applicable Do you have any questions related to the use of the equipment or supplies? No  Functional Questionnaire: (I = Independent and D = Dependent) ADLs: I  Bathing/Dressing- I  Meal Prep- I  Eating- I  Maintaining continence- I  Transferring/Ambulation- I  Managing Meds- I  Follow up appointments reviewed:   PCP Hospital f/u appt confirmed? No  Scheduled to Patient refused to make appt today. States he will call back tomorrow  [facility] f/u appt confirmed? No  Neuro to call pt to schedule  Are transportation arrangements needed? No   If their condition worsens, is the pt aware to call PCP or go to the Emergency Dept.? Yes  Was the patient provided with contact information for the PCP's office or ED? Yes  Was to pt encouraged to call back with questions or concerns? Yes

## 2020-01-07 LAB — ACETYLCHOLINE RECEPTOR AB, ALL
Acety choline binding ab: 0.07 nmol/L (ref 0.00–0.24)
Acetylchol Block Ab: 19 % (ref 0–25)

## 2020-01-08 ENCOUNTER — Telehealth: Payer: Self-pay | Admitting: Family Medicine

## 2020-01-08 NOTE — Telephone Encounter (Signed)
Informed patient of 3:30 appt on Friday, 01/10/20 and he verbalized understanding. Someone with access please double book that time slot.

## 2020-01-08 NOTE — Telephone Encounter (Signed)
Patient will be added to schedule by management.

## 2020-01-08 NOTE — Telephone Encounter (Signed)
Patient states he was hospitalized last week and told to followup with PCP within 10 days. Patient is requesting late in the day and preferably on a Friday due to his transportation. Dr. Idelle Leech schedule is full until late December. Please call patient to advise and schedule if possible.

## 2020-01-08 NOTE — Telephone Encounter (Signed)
Please advise if sooner appt can be done.

## 2020-01-08 NOTE — Telephone Encounter (Signed)
This Friday after my 3:30 appt is good.

## 2020-01-10 ENCOUNTER — Ambulatory Visit: Payer: Medicare Other

## 2020-01-10 ENCOUNTER — Other Ambulatory Visit: Payer: Self-pay

## 2020-01-10 ENCOUNTER — Ambulatory Visit (INDEPENDENT_AMBULATORY_CARE_PROVIDER_SITE_OTHER): Payer: Medicare Other | Admitting: Family Medicine

## 2020-01-10 ENCOUNTER — Encounter: Payer: Self-pay | Admitting: Family Medicine

## 2020-01-10 VITALS — BP 155/83 | HR 70 | Temp 97.4°F | Resp 16 | Ht 70.0 in | Wt 157.8 lb

## 2020-01-10 DIAGNOSIS — H532 Diplopia: Secondary | ICD-10-CM | POA: Diagnosis not present

## 2020-01-10 DIAGNOSIS — H4912 Fourth [trochlear] nerve palsy, left eye: Secondary | ICD-10-CM | POA: Diagnosis not present

## 2020-01-10 DIAGNOSIS — I639 Cerebral infarction, unspecified: Secondary | ICD-10-CM

## 2020-01-10 DIAGNOSIS — I255 Ischemic cardiomyopathy: Secondary | ICD-10-CM

## 2020-01-10 NOTE — Progress Notes (Signed)
01/10/2020  CC: TCM, f/u CVA  Patient is a 74 y.o. Caucasian male who presents for  hospital follow up, specifically Transitional Care Services face-to-face visit. Dates hospitalized: 12/1-12/4, 2021. Days since d/c from hospital: 6 days Patient was discharged from hospital to home. Reason for admission to hospital: diplopia Date of interactive (phone) contact with patient and/or caregiver:01/06/20  I have reviewed patient's discharge summary plus pertinent specific notes, labs, and imaging from the hospitalization.   1. 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 DAPT. 2. Cardiomyopathy/Chronic systolic CHF with EF 21-19%. Stable s/p AICD placement. 3. History of atrial fibrillation. No AC due to CHADS score 1 and only one known episode.  4. Hypertension 5. Hyperlipidemia 6. Concern for MG. Acetylcholine receptor antibody and MuSK antibody pending to rule out MG.  He is feeling well, says he no longer has any double vision.  Followed up with eye MD today, was told everything looks good and he can take the tape off his L eyeglasses lens tomorrow.  Looks like he won't have to get prism glasses at this point. His MG panel was completely neg/normal. He has a neurology outpt appt set for 02/12/20. He is tolerating plavix with his other chronic meds w/out problem. No signs of occult/overt bleeding.  Medication reconciliation was done today and patient is taking meds as recommended by discharging hospitalist/specialist.    PMH:  Past Medical History:  Diagnosis Date  . Anxiety   . Atrial fibrillation ([place])    Limited episode, emergency room, June, 2013, spontaneous conversion to sinus rhythm, was evaluated By [name] 2013- no further visits.  Post-op (CABG) a-fib, converted on amio but pt self d/c'd this med due to DOE/side effect profile.  [name] CAD (coronary artery disease) 05/2016   a. 05/2016: NSTEMI with cath showing 3-vessel disease.  Underwent CABG on 05/12/16 with LIMA-D1, SVG-LAD, and SVG-PDA.   [name] COPD (chronic obstructive pulmonary disease) ([place]) fall 2016   Bullous changes noted on lower lung images of CT abd done by urology  . CVA (cerebral vascular accident) ([place]) 01/2020   L midbrain, with mild L 4th CN palsy-->small vessel dz->DAPT  . Diverticulitis   . Double vision 01/2020   MG ab w/u NEG.  +4th nerve palsy/mid brain CVA, small vessels dz->DAPT  . GERD (gastroesophageal reflux disease)   . Has received pneumococcal vaccination   . Hyperlipidemia    statin intolerant. Pt declined advanced lipid clinic referral 08/2019  . Hypertension   . Ischemic cardiomyopathy 05/2016   EF 20-25% at the time of NSTEMI/CABG.  Repeat EF 07/2016 30-35%.  07/2017 EF 25-30%, pt intol of entresto, bidil, and BB--for ICD 03/2019.  . Microscopic hematuria Fall 2016   CT nl except nonobstructing stones.  Cystoscopy normal 12/30/14 ([name])  . Nephrolithiasis    11 mm stone on R, 2 mm stone on L, ureters clear  . Non-STEMI (non-ST elevated myocardial infarction) ([place]) 05/2016   with impaired LV function  . Prostatic adenocarcinoma ([place]) 02/2015   No evidence of metastatic dz on CT pelv 02/2015.  Urol ([name]) at [facility]; [name] referred him to [name] 03/2015: patient got bilat nerve sparing , robot assisted laparoscopic radical prostatectomy and pelvic lymphadenectomy.  Urol f/u, PSA undetectable on repeat 04/22/16 and 10/28/16; pt chose PSA surveill over adjuv rad tx.   . Tobacco dependence    down to 1-2 cigs per day as of 11/2015.  Restarted 08/2016.  [name]  Urinary retention 05/2015   occurred s/p foley removal    PSH:  Past Surgical History:  Procedure Laterality Date  . aortic ultrasound  07/2012   NO AAA  . COLONOSCOPY  approx 2011 per pt   Done by surgeon in [place] after a bout of diverticulitis; normal per pt report--was told to repeat in 10 yrs.  . CORONARY ARTERY BYPASS GRAFT N/A 05/12/2016   Procedure: CORONARY  ARTERY BYPASS GRAFTING (CABG) TIMES THREE USING LEFT INTERNAL MAMMARY ARTERY AND RIGHT SAPHENOUS LEG VEIN HARVESTED ENDOSCOPICALLY;  Surgeon: [name], MD;  Location: [facility];  Service: Open Heart Surgery;  Laterality: N/A;  . ICD IMPLANT N/A 04/12/2019   Procedure: ICD IMPLANT;  Surgeon: Evans Lance, MD;  Location: [facility];  Service: Cardiovascular;  Laterality: N/A;  . INGUINAL HERNIA REPAIR  2003 and 2004   Both sides.  [name] LAPAROSCOPY  2017  . LEFT HEART CATH AND CORONARY ANGIOGRAPHY N/A 05/09/2016   Procedure: Left Heart Cath and Coronary Angiography;  Surgeon: [name], MD;  Location: [place] CV [facility];  Service: Cardiovascular;  Laterality: N/A;  . LITHOTRIPSY  2004   Dr. [place] Pink in [place].  [name] LYMPHADENECTOMY Bilateral 04/30/2015   Procedure: PELVIC LYMPHADENECTOMY;  Surgeon: [name], MD;  [facility];  Service: Urology;  Laterality: Bilateral;  . PROSTATE BIOPSY  02/05/15   Prostate adenocarcinoma: pt considering treatment options as of 02/19/15  . ROBOT ASSISTED LAPAROSCOPIC RADICAL PROSTATECTOMY N/A 04/30/2015   Procedure: XI ROBOTIC ASSISTED LAPAROSCOPIC RADICAL PROSTATECTOMY LEVEL 2;  Surgeon: [name], MD;  [facility];  Service: Urology;  Laterality: N/A;  . TEE WITHOUT CARDIOVERSION N/A 05/12/2016   Procedure: TRANSESOPHAGEAL ECHOCARDIOGRAM (TEE);  Surgeon: [name], MD;  Location: [facility];  Service: Open Heart Surgery;  Laterality: N/A;  . TRANSTHORACIC ECHOCARDIOGRAM  03/22/2006; 05/2016; 07/2016; 07/2017   2008: EF 65%, normal valves, no wall motion abnormalties, LA size normal.  05/2016 in context of non-STEMI, EF 20-25%, mild AV and MV regurg.  08/05/16: EF 30-35%, akinesis of mid lateral myoc, grd II DD, mild aortic 07/2017: EF 25-30%, mult areas of akinesis, grd I DD.    MEDS:  Outpatient Medications Prior to Visit  Medication Sig Dispense Refill  . ALPRAZolam (XANAX) 0.25 MG tablet TAKE ONE TABLET BY MOUTH AT BEDTIME AS NEEDED  (Patient taking differently: Take 0.25 mg by mouth at bedtime as needed for anxiety.) 30 tablet 5  . aspirin EC 81 MG EC tablet Take 1 tablet (81 mg total) by mouth daily for 21 days. Swallow whole. 21 tablet 0  . atorvastatin (LIPITOR) 20 MG tablet Take 1 tablet (20 mg total) by mouth daily. 30 tablet 0  . benazepril (LOTENSIN) 20 MG tablet Take 1 tablet (20 mg total) by mouth daily. 1 tab po bid 180 tablet 3  . clopidogrel (PLAVIX) 75 MG tablet Take 1 tablet (75 mg total) by mouth daily. 30 tablet 0  . Omega-3 Fatty Acids (FISH OIL) 1000 MG CAPS Take 1,000 mg by mouth daily.     Vladimir Faster Glycol-Propyl Glycol (LUBRICANT EYE DROPS) 0.4-0.3 % SOLN Place 1 drop into both eyes 3 (three) times daily as needed (dry/irritated eyes.).    [name] Vitamin D, Cholecalciferol, 400 units CAPS Take 400 Units by mouth daily.      No facility-administered medications prior to visit.   EXAM:  Vitals with BMI 01/10/2020 01/03/2020 01/03/2020  Height 5\' 10"  - -  Weight 157 lbs 13  oz - -  BMI 73.71 - -  Systolic 062 694 854  Diastolic 83 95 78  Pulse 70 75 73   Gen: Alert, well appearing.  Patient is oriented to person, place, time, and situation. AFFECT: pleasant, lucid thought and speech. OEV:OJJK: no injection, icteris, swelling, or exudate.  EOMI, PERRLA. Mouth: lips without lesion/swelling.  Oral mucosa pink and moist. Oropharynx without erythema, exudate, or swelling.  CV: RRR, no m/r/g.   LUNGS: CTA bilat, nonlabored resps, good aeration in all lung fields. Neuro: CN 2-12 intact bilaterally, strength 5/5 in proximal and distal upper extremities and lower extremities bilaterally.  No sensory deficits.  No tremor.  No disdiadochokinesis.  No ataxia.  Upper extremity and lower extremity DTRs symmetric.  No pronator drift.   Pertinent labs/imaging Lab Results  Component Value Date   TSH 0.680 01/01/2020   Lab Results  Component Value Date   WBC 5.1 01/02/2020   HGB 15.3 01/02/2020   HCT 45.1  01/02/2020   MCV 95.8 01/02/2020   PLT 153 01/02/2020   Lab Results  Component Value Date   CREATININE 0.84 01/02/2020   BUN 17 01/01/2020   NA 137 01/01/2020   K 3.9 01/01/2020   CL 104 01/01/2020   CO2 24 01/01/2020   Lab Results  Component Value Date   ALT 19 01/01/2020   AST 25 01/01/2020   ALKPHOS 74 01/01/2020   BILITOT 0.9 01/01/2020   Lab Results  Component Value Date   CHOL 159 01/02/2020   Lab Results  Component Value Date   HDL 39 (L) 01/02/2020   Lab Results  Component Value Date   LDLCALC 111 (H) 01/02/2020   Lab Results  Component Value Date   TRIG 45 01/02/2020   Lab Results  Component Value Date   CHOLHDL 4.1 01/02/2020   Lab Results  Component Value Date   PSA 0.028 11/02/2018   PSA 0.019 04/27/2018   PSA <0.02 10/15/2015   Lab Results  Component Value Date   HGBA1C 5.2 01/02/2020    ASSESSMENT/PLAN:  Acute L midbrain CVA resulting in L CN IV palsy-->deficit essentially completely resolved. Tolerating ASA and plavix well w/out sign of bleeding probs. BP at current level (140s/70s) is where he feels well-->when bp gets lower he feels bad--->so we'll leave bp regimen alone.  No labs indicated today.  Medical decision making of moderate complexity was utilized today.  FOLLOW UP:  Keep f/u set with me for 02/07/20.  If he is still doing well when I see him at that time I think it would be ok to cancel his neurologist appt set for 02/12/20--which pt prefers to do if I think it is ok.  He has appt with cardiology 02/05/20.  Signed:  Crissie Sickles, MD           01/10/2020

## 2020-01-13 LAB — CUP PACEART REMOTE DEVICE CHECK
Battery Remaining Longevity: 87 mo
Battery Remaining Percentage: 87 %
Battery Voltage: 2.98 V
Brady Statistic AP VP Percent: 5.8 %
Brady Statistic AP VS Percent: 79 %
Brady Statistic AS VP Percent: 1 %
Brady Statistic AS VS Percent: 15 %
Brady Statistic RA Percent Paced: 83 %
Brady Statistic RV Percent Paced: 6 %
Date Time Interrogation Session: 20211210010038
HighPow Impedance: 73 Ohm
Implantable Lead Implant Date: 20210312
Implantable Lead Implant Date: 20210312
Implantable Lead Location: 753859
Implantable Lead Location: 753860
Implantable Pulse Generator Implant Date: 20210312
Lead Channel Impedance Value: 440 Ohm
Lead Channel Impedance Value: 460 Ohm
Lead Channel Pacing Threshold Amplitude: 0.5 V
Lead Channel Pacing Threshold Amplitude: 1 V
Lead Channel Pacing Threshold Pulse Width: 0.5 ms
Lead Channel Pacing Threshold Pulse Width: 0.5 ms
Lead Channel Sensing Intrinsic Amplitude: 12 mV
Lead Channel Sensing Intrinsic Amplitude: 3.5 mV
Lead Channel Setting Pacing Amplitude: 1.25 V
Lead Channel Setting Pacing Amplitude: 1.5 V
Lead Channel Setting Pacing Pulse Width: 0.5 ms
Lead Channel Setting Sensing Sensitivity: 0.5 mV
Pulse Gen Serial Number: 111016652

## 2020-01-23 NOTE — Progress Notes (Signed)
Remote ICD transmission.   

## 2020-02-04 NOTE — Progress Notes (Signed)
Cardiology Office Note   Date:  02/05/2020   ID:  Daniel [name]., DOB 06/13/1945, MRN [id number]  PCP:  Jeoffrey Massed, MD  Cardiologist:   Rollene Rotunda, MD   Chief Complaint  Patient presents with  . Coronary Artery Disease      History of Present Illness: Daniel [name]. is a 75 y.o. male who presents for follow up of CAD s/p CABG LIMA to D1, SVG to LAD, SVG to PDA on 05/12/2016, HLD intolerant to statin, HTN, ICM with baseline EF 30-35%, and h/o prostate CA.His last cardiac catheterization on 05/09/2016 showed EF 20-25%, akinesis of the distal inferoapical segment with significant hypokinesis globally consistent with ischemiccardiomyopathy, multivessel disease with 70% followed by total occlusion of large LAD with distal collateral arteries, 50% proximal left circumflex stenosis with total occlusion of distal left circumflex, total occlusion of mid RCA with distal collateral artery. Bypass surgery was recommended and he eventually underwent successful CABG 3 by Dr. Dorris Fetch on 05/12/2016 with LIMA to diagonal, SVG to LAD, SVG to PDA. He developed postoperative atrial fibrillation and was treated with IV amiodarone with conversion to sinus rhythm prior to discharge. He had a follow-up echocardiogram on 08/05/2016 which revealed LV EF remains low at 30-35% however slightly improved from his previous 20-25%.  He was placed on carvedilol and Entresto, spironolactone was discontinued due to elevated potassium level. Unfortunately, he went back to the hospital on 11/20/2016 with shortness breath, diffuse rash and presyncope. He was given prednisone for his rash. He stopped the [facility] and restarted ACE inhibitor as he was sure the rash was related to the former.  Last EF in July was 25 - 30%.   He could not take the BiDil.  His blood pressure dropped too low.  He had a low heart rate and we stopped Coreg.  He also stop spironolactone as he thought it was making his heart skip . He now status  post ICD.  He returns for follow up.  Since I last saw him he was actually in the hospital with double vision.  I reviewed these records.  There was a question of a left midbrain stroke.  He was sent home with dual antiplatelet therapy.  An echocardiogram was done and I see that there was no significant change in his ejection fraction or other abnormalities.  He says he is actually feeling better and thinks it was more of a sinus issue.  He has not had any new palpitations, presyncope or syncope.  Denies any shortness of breath, PND or orthopnea.  Said no chest pressure, neck or arm discomfort.  Has had no weight gain or edema.   Past Medical History:  Diagnosis Date  . Anxiety   . Atrial fibrillation ([facility])    Limited episode, emergency room, June, 2013, spontaneous conversion to sinus rhythm, was evaluated By Dr. Myrtis Ser 2013- no further visits.  Post-op (CABG) a-fib, converted on amio but pt self d/c'd this med due to DOE/side effect profile.  [name] CAD (coronary artery disease) 05/2016   a. 05/2016: NSTEMI with cath showing 3-vessel disease. Underwent CABG on 05/12/16 with LIMA-D1, SVG-LAD, and SVG-PDA.   [name] COPD (chronic obstructive pulmonary disease) ([facility]) fall 2016   Bullous changes noted on lower lung images of CT abd done by urology  . CVA (cerebral vascular accident) ([facility]) 01/2020   L midbrain, with mild L 4th CN palsy-->small vessel dz->DAPT  . Diverticulitis   . Double vision 01/2020  MG ab w/u NEG.  +4th nerve palsy/mid brain CVA, small vessels dz->DAPT  . GERD (gastroesophageal reflux disease)   . Has received pneumococcal vaccination   . Hyperlipidemia    statin intolerant. Pt declined advanced lipid clinic referral 08/2019  . Hypertension   . Ischemic cardiomyopathy 05/2016   EF 20-25% at the time of NSTEMI/CABG.  Repeat EF 07/2016 30-35%.  07/2017 EF 25-30%, pt intol of entresto, bidil, and BB--for ICD 03/2019.  . Microscopic hematuria Fall 2016   CT nl except nonobstructing stones.   Cystoscopy normal 12/30/14 ([name])  . Nephrolithiasis    11 mm stone on R, 2 mm stone on L, ureters clear  . Non-STEMI (non-ST elevated myocardial infarction) ([place]) 05/2016   with impaired LV function  . Prostatic adenocarcinoma ([place]) 02/2015   No evidence of metastatic dz on CT pelv 02/2015.  Urol ([name]) at [facility]; [name] referred him to [name] 03/2015: patient got bilat nerve sparing , robot assisted laparoscopic radical prostatectomy and pelvic lymphadenectomy.  Urol f/u, PSA undetectable on repeat 04/22/16 and 10/28/16; pt chose PSA surveill over adjuv rad tx.   . Tobacco dependence    down to 1-2 cigs per day as of 11/2015.  Restarted 08/2016.  [name] Urinary retention 05/2015   occurred s/p foley removal    Past Surgical History:  Procedure Laterality Date  . aortic ultrasound  07/2012   NO AAA  . COLONOSCOPY  approx 2011 per pt   Done by surgeon in [place] after a bout of diverticulitis; normal per pt report--was told to repeat in 10 yrs.  . CORONARY ARTERY BYPASS GRAFT N/A 05/12/2016   Procedure: CORONARY ARTERY BYPASS GRAFTING (CABG) TIMES THREE USING LEFT INTERNAL MAMMARY ARTERY AND RIGHT SAPHENOUS LEG VEIN HARVESTED ENDOSCOPICALLY;  Surgeon: [name], MD;  Location: [facility];  Service: Open Heart Surgery;  Laterality: N/A;  . ICD IMPLANT N/A 04/12/2019   Procedure: ICD IMPLANT;  Surgeon: Evans Lance, MD;  Location: [place] [facility];  Service: Cardiovascular;  Laterality: N/A;  . INGUINAL HERNIA REPAIR  2003 and 2004   Both sides.  [name] LAPAROSCOPY  2017  . LEFT HEART CATH AND CORONARY ANGIOGRAPHY N/A 05/09/2016   Procedure: Left Heart Cath and Coronary Angiography;  Surgeon: [name], MD;  Location: [place] [facility];  Service: Cardiovascular;  Laterality: N/A;  . LITHOTRIPSY  2004   Dr. [place] Pink in [place].  [name] LYMPHADENECTOMY Bilateral 04/30/2015   Procedure: PELVIC LYMPHADENECTOMY;  Surgeon: [name], MD;  [facility];  Service: Urology;   Laterality: Bilateral;  . PROSTATE BIOPSY  02/05/15   Prostate adenocarcinoma: pt considering treatment options as of 02/19/15  . ROBOT ASSISTED LAPAROSCOPIC RADICAL PROSTATECTOMY N/A 04/30/2015   Procedure: XI ROBOTIC ASSISTED LAPAROSCOPIC RADICAL PROSTATECTOMY LEVEL 2;  Surgeon: [name], MD;  [facility];  Service: Urology;  Laterality: N/A;  . TEE WITHOUT CARDIOVERSION N/A 05/12/2016   Procedure: TRANSESOPHAGEAL ECHOCARDIOGRAM (TEE);  Surgeon: [name], MD;  Location: [facility];  Service: Open Heart Surgery;  Laterality: N/A;  . TRANSTHORACIC ECHOCARDIOGRAM  03/22/2006; 05/2016; 07/2016; 07/2017   2008: EF 65%, normal valves, no wall motion abnormalties, LA size normal.  05/2016 in context of non-STEMI, EF 20-25%, mild AV and MV regurg.  08/05/16: EF 30-35%, akinesis of mid lateral myoc, grd II DD, mild aortic 07/2017: EF 25-30%, mult areas of akinesis, grd I DD.     Current Outpatient Medications  Medication Sig Dispense Refill  .  ALPRAZolam (XANAX) 0.25 MG tablet TAKE ONE TABLET BY MOUTH AT BEDTIME AS NEEDED (Patient taking differently: Take 0.25 mg by mouth at bedtime as needed for anxiety.) 30 tablet 5  . aspirin EC 81 MG tablet Take 1 tablet (81 mg total) by mouth daily. Swallow whole. 90 tablet 3  . benazepril (LOTENSIN) 20 MG tablet Take 1 tablet (20 mg total) by mouth daily. 1 tab po bid 180 tablet 3  . clopidogrel (PLAVIX) 75 MG tablet Take 1 tablet (75 mg total) by mouth daily. 30 tablet 0  . Omega-3 Fatty Acids (FISH OIL) 1000 MG CAPS Take 1,000 mg by mouth daily.     Vladimir Faster Glycol-Propyl Glycol (LUBRICANT EYE DROPS) 0.4-0.3 % SOLN Place 1 drop into both eyes 3 (three) times daily as needed (dry/irritated eyes.).    [name] Vitamin D, Cholecalciferol, 400 units CAPS Take 400 Units by mouth daily.     [name] atorvastatin (LIPITOR) 20 MG tablet Take 1 tablet (20 mg total) by mouth daily. (Patient not taking: Reported on 02/05/2020) 30 tablet 0   No current facility-administered  medications for this visit.    Allergies:   Entresto [sacubitril-valsartan], Amlodipine, Contrast media [iodinated diagnostic agents], and Hydralazine    ROS:  Please see the history of present illness.   Otherwise, review of systems are positive for none.   All other systems are reviewed and negative.    PHYSICAL EXAM: VS:  BP (!) 144/90   Pulse 76   Ht 5\' 10"  (1.778 m)   Wt 158 lb (71.7 kg)   BMI 22.67 kg/m  , BMI Body mass index is 22.67 kg/m.  GENERAL:  Well appearing NECK:  No jugular venous distention, waveform within normal limits, carotid upstroke brisk and symmetric, no bruits, no thyromegaly LUNGS:  Clear to auscultation bilaterally CHEST: Well-healed ICD pocket HEART:  PMI not displaced or sustained,S1 and S2 within normal limits, no S3, no S4, no clicks, no rubs, no murmurs ABD:  Flat, positive bowel sounds normal in frequency in pitch, no bruits, no rebound, no guarding, no midline pulsatile mass, no hepatomegaly, no splenomegaly EXT:  2 plus pulses throughout, no edema, no cyanosis no clubbing   EKG:  EKG is not done today NA  Recent Labs: 01/01/2020: ALT 19; BUN 17; Potassium 3.9; Sodium 137; TSH 0.680 01/02/2020: Creatinine, Ser 0.84; Hemoglobin 15.3; Platelets 153    Lipid Panel    Component Value Date/Time   CHOL 159 01/02/2020 0446   CHOL 137 09/16/2016 0858   TRIG 45 01/02/2020 0446   HDL 39 (L) 01/02/2020 0446   HDL 35 (L) 09/16/2016 0858   CHOLHDL 4.1 01/02/2020 0446   VLDL 9 01/02/2020 0446   LDLCALC 111 (H) 01/02/2020 0446   LDLCALC 88 09/16/2016 0858      Wt Readings from Last 3 Encounters:  02/05/20 158 lb (71.7 kg)  01/10/20 157 lb 12.8 oz (71.6 kg)  01/01/20 161 lb (73 kg)      Other studies Reviewed: Additional studies/ records that were reviewed today include: Hospital records and recent device interrogation Review of the above records demonstrates:  See elsewhere  ASSESSMENT AND PLAN:  CAD s/p CABG:     He is not having any  chest pain.  He is reluctant to take any medications and there will be changes as below.  No further imaging.    Ischemic cardiomyopathy:       EF remains low at 25 - 30%.  He has not tolerated med titration.  He does not want further med titration.  I think I have convinced him to take most of what he is taking now.  Hypertension: This is being managed in the context of managing his cardiomyopathy and again he does not want up titration of his medications.   Hyperlipidemia:   He wants to come off the statin.  He promises that he can change his diet.  We talked about this and in 2 months if his cholesterol is elevated off the Lipitor he would maybe consent to restarting it.   PAF:    I looked back at his device interrogation and he has had no evidence of paroxysmal atrial fibrillation.   Covid education:    He has no interest in taking the vaccine.  We talked about this again today.  Current medicines are reviewed at length with the patient today.  The patient does not have concerns regarding medicines.  The following changes have been made: As above  Labs/ tests ordered today include:   Orders Placed This Encounter  Procedures  . Hepatic function panel  . Lipid panel     Disposition:   FU with me in 2 months.   Signed, [name], MD  02/05/2020 1:03 PM    [place]

## 2020-02-05 ENCOUNTER — Encounter: Payer: Self-pay | Admitting: Cardiology

## 2020-02-05 ENCOUNTER — Other Ambulatory Visit: Payer: Self-pay

## 2020-02-05 ENCOUNTER — Ambulatory Visit: Payer: Medicare Other | Admitting: Cardiology

## 2020-02-05 VITALS — BP 144/90 | HR 76 | Ht 70.0 in | Wt 158.0 lb

## 2020-02-05 DIAGNOSIS — E785 Hyperlipidemia, unspecified: Secondary | ICD-10-CM | POA: Diagnosis not present

## 2020-02-05 DIAGNOSIS — I2510 Atherosclerotic heart disease of native coronary artery without angina pectoris: Secondary | ICD-10-CM

## 2020-02-05 DIAGNOSIS — I255 Ischemic cardiomyopathy: Secondary | ICD-10-CM

## 2020-02-05 DIAGNOSIS — Z79899 Other long term (current) drug therapy: Secondary | ICD-10-CM

## 2020-02-05 DIAGNOSIS — R5383 Other fatigue: Secondary | ICD-10-CM

## 2020-02-05 DIAGNOSIS — I480 Paroxysmal atrial fibrillation: Secondary | ICD-10-CM

## 2020-02-05 MED ORDER — ASPIRIN EC 81 MG PO TBEC: 81.0000 mg | DELAYED_RELEASE_TABLET | Freq: Every day | ORAL | 3 refills | Status: DC

## 2020-02-05 NOTE — Patient Instructions (Addendum)
Medication Instructions:  You may hold your Atorvastatin.  Continue all other medications as listed.  *If you need a refill on your cardiac medications before your next appointment, please call your pharmacy*  Lab:  Please have fasting Lipid and liver panel in 2 months with results faxed to [phone].  Follow-Up: At [facility], you and your health needs are our priority.  As part of our continuing mission to provide you with exceptional heart care, we have created designated Provider Care Teams.  These Care Teams include your primary Cardiologist (physician) and Advanced Practice Providers (APPs -  Physician Assistants and Nurse Practitioners) who all work together to provide you with the care you need, when you need it.  We recommend signing up for the patient portal called "MyChart".  Sign up information is provided on this After Visit Summary.  MyChart is used to connect with patients for Virtual Visits (Telemedicine).  Patients are able to view lab/test results, encounter notes, upcoming appointments, etc.  Non-urgent messages can be sent to your provider as well.   To learn more about what you can do with MyChart, go to [email or web address].    Your next appointment:   2 month(s)  The format for your next appointment:   In Person  Provider:   Rollene Rotunda, MD   Thank you for choosing [facility]!!

## 2020-02-06 ENCOUNTER — Other Ambulatory Visit: Payer: Self-pay

## 2020-02-07 ENCOUNTER — Ambulatory Visit: Payer: Medicare Other | Admitting: Family Medicine

## 2020-02-07 ENCOUNTER — Other Ambulatory Visit: Payer: Self-pay

## 2020-02-07 DIAGNOSIS — H10013 Acute follicular conjunctivitis, bilateral: Secondary | ICD-10-CM | POA: Diagnosis not present

## 2020-02-07 NOTE — Telephone Encounter (Signed)
Pharmacy called and stated that pt is out of Plavix. Rx was going to be discussed during appt today. Pharmacy gave pt a bridge for the weekend but will be out Monday. Please send rx to pharm if okay. Rx not currently prescribed by you. Pt has upcoming appt with PCP on 02/21/20

## 2020-02-09 NOTE — Telephone Encounter (Signed)
He can STOP taking plavix now.  Continue aspirin 81mg  every day.

## 2020-02-10 NOTE — Telephone Encounter (Signed)
Patient advised and voiced understanding.  

## 2020-02-12 ENCOUNTER — Inpatient Hospital Stay: Payer: Self-pay | Admitting: Adult Health

## 2020-02-21 ENCOUNTER — Ambulatory Visit: Payer: Medicare Other | Admitting: Family Medicine

## 2020-02-27 ENCOUNTER — Other Ambulatory Visit: Payer: Self-pay

## 2020-02-28 ENCOUNTER — Encounter: Payer: Self-pay | Admitting: Family Medicine

## 2020-02-28 ENCOUNTER — Ambulatory Visit (INDEPENDENT_AMBULATORY_CARE_PROVIDER_SITE_OTHER): Payer: Medicare Other | Admitting: Family Medicine

## 2020-02-28 VITALS — BP 175/91 | HR 74 | Temp 97.4°F | Resp 16 | Ht 70.0 in | Wt 158.2 lb

## 2020-02-28 DIAGNOSIS — I255 Ischemic cardiomyopathy: Secondary | ICD-10-CM

## 2020-02-28 DIAGNOSIS — Z8673 Personal history of transient ischemic attack (TIA), and cerebral infarction without residual deficits: Secondary | ICD-10-CM

## 2020-02-28 DIAGNOSIS — I10 Essential (primary) hypertension: Secondary | ICD-10-CM | POA: Diagnosis not present

## 2020-02-28 DIAGNOSIS — E7800 Pure hypercholesterolemia, unspecified: Secondary | ICD-10-CM | POA: Diagnosis not present

## 2020-02-28 NOTE — Progress Notes (Signed)
OFFICE VISIT  02/28/2020  CC:  Chief Complaint  Patient presents with  . Follow-up    RCI, pt is not fasting   HPI:    Patient is a 75 y.o. Caucasian Daniel [name] who presents for f/u HTN, anxiety/insomnia, and relatively recent CVA. I last saw him 01/10/20 for TCM hosp f/u. 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 Tolerating ASA and plavix well w/out sign of bleeding probs. BP at current level (140s/70s) is where he feels well-->when bp gets lower he feels bad--->so we'll leave bp regimen alone."  INTERIM HX: Doing well, no vision complaints at all. He chose to cancel his neurologist appt.  HTN: twice daily bp checks show 130-140/80-85. States he feels generally weak IF bp gets below 130/80.  CAD, hx of MI, ischemic CM w/EF 25-30%, PAF, symptomatic bradycardia, has AICD, followed by cardiology. Historically he's been resistant to med titration. Most recent cardiology f/u was about 3 wks ago, no changes were made except pt elected to stop his statin (weakness/nausea) and wants to see how his attempts at aggressive TLC go for a while.  No new imaging or other testing was recommended. Says he is eating lots of oats, avoiding fried and fatty foods. Works around [employer] and in his garage working on some cars---no SOB or CP or dizziness or palpitations. He uses chainsaw to cut firewood, stacks firewood.  Anxiety/anxiety-induced insomnia: takes alpraz about 2 times a month. PMP AWARE reviewed today: most recent rx for alprazolam 0.25mg  was filled 02/18/20, # 30, rx by me. No Daniel flags.   Past Medical History:  Diagnosis Date  . Anxiety   . Atrial fibrillation ([place] [name])    Limited episode, emergency room, June, 2013, spontaneous conversion to sinus rhythm, was evaluated By [name] 2013- no further visits.  Post-op (CABG) a-fib, converted on amio but pt self d/c'd this med due to DOE/side effect profile.  [name] CAD (coronary  artery disease) 05/2016   a. 05/2016: NSTEMI with cath showing 3-vessel disease. Underwent CABG on 05/12/16 with LIMA-D1, SVG-LAD, and SVG-PDA.   [name] COPD (chronic obstructive pulmonary disease) ([place]) fall 2016   Bullous changes noted on lower lung images of CT abd done by urology  . CVA (cerebral vascular accident) (Daniel [name]) 01/2020   L midbrain, with mild L 4th CN palsy-->small vessel dz->DAPT  . Diverticulitis   . Double vision 01/2020   MG ab w/u NEG.  +4th nerve palsy/mid brain CVA, small vessels dz->DAPT  . GERD (gastroesophageal reflux disease)   . Has received pneumococcal vaccination   . Hyperlipidemia    statin intolerant. Pt declined advanced lipid clinic referral 08/2019  . Hypertension   . Ischemic cardiomyopathy 05/2016   EF 20-25% at the time of NSTEMI/CABG.  Repeat EF 07/2016 30-35%.  07/2017 EF 25-30%, pt intol of entresto, bidil, and BB--for ICD 03/2019.  . Microscopic hematuria Fall 2016   CT nl except nonobstructing stones.  Cystoscopy normal 12/30/14 ([name])  . Nephrolithiasis    11 mm stone on R, 2 mm stone on L, ureters clear  . Non-STEMI (non-ST elevated myocardial infarction) ([place] [name]) 05/2016   with impaired LV function  . Prostatic adenocarcinoma ([place] [name]) 02/2015   No evidence of metastatic dz on CT pelv 02/2015.  Urol ([name]) at [facility]; [name] referred him to [name] 03/2015: patient got bilat nerve sparing , robot assisted laparoscopic radical prostatectomy and pelvic lymphadenectomy.  Urol f/u, PSA  undetectable on repeat 04/22/16 and 10/28/16; pt chose PSA surveill over adjuv rad tx.   . Tobacco dependence    down to 1-2 cigs per day as of 11/2015.  Restarted 08/2016.  [name] Urinary retention 05/2015   occurred s/p foley removal    Past Surgical History:  Procedure Laterality Date  . aortic ultrasound  07/2012   NO AAA  . COLONOSCOPY  approx 2011 per pt   Done by surgeon in [place] after a bout of diverticulitis; normal per pt report--was told to repeat in 10 yrs.   . CORONARY ARTERY BYPASS GRAFT N/A 05/12/2016   Procedure: CORONARY ARTERY BYPASS GRAFTING (CABG) TIMES THREE USING LEFT INTERNAL MAMMARY ARTERY AND RIGHT SAPHENOUS LEG VEIN HARVESTED ENDOSCOPICALLY;  Surgeon: [name], MD;  Location: [place];  Service: Open Heart Surgery;  Laterality: N/A;  . ICD IMPLANT N/A 04/12/2019   Procedure: ICD IMPLANT;  Surgeon: Evans Lance, MD;  Location: [place] [facility];  Service: Cardiovascular;  Laterality: N/A;  . INGUINAL HERNIA REPAIR  2003 and 2004   Both sides.  [name] LAPAROSCOPY  2017  . LEFT HEART CATH AND CORONARY ANGIOGRAPHY N/A 05/09/2016   Procedure: Left Heart Cath and Coronary Angiography;  Surgeon: [name], MD;  Location: [place] [facility];  Service: Cardiovascular;  Laterality: N/A;  . LITHOTRIPSY  2004   Dr. [place] Pink in [name].  [name] LYMPHADENECTOMY Bilateral 04/30/2015   Procedure: PELVIC LYMPHADENECTOMY;  Surgeon: [name], MD;  Location: [facility];  Service: Urology;  Laterality: Bilateral;  . PROSTATE BIOPSY  02/05/15   Prostate adenocarcinoma: pt considering treatment options as of 02/19/15  . ROBOT ASSISTED LAPAROSCOPIC RADICAL PROSTATECTOMY N/A 04/30/2015   Procedure: XI ROBOTIC ASSISTED LAPAROSCOPIC RADICAL PROSTATECTOMY LEVEL 2;  Surgeon: [name], MD;  Location: [facility];  Service: Urology;  Laterality: N/A;  . TEE WITHOUT CARDIOVERSION N/A 05/12/2016   Procedure: TRANSESOPHAGEAL ECHOCARDIOGRAM (TEE);  Surgeon: [name], MD;  Location: [place];  Service: Open Heart Surgery;  Laterality: N/A;  . TRANSTHORACIC ECHOCARDIOGRAM  03/22/2006; 05/2016; 07/2016; 07/2017; 01/02/20   2008: EF 65%, normal valves, no wall motion abnormalties, LA size normal.  05/2016 in context of non-STEMI, EF 20-25%, mild AV and MV regurg.  08/05/16: EF 30-35%, akinesis of mid lateral myoc, grd II DD, mild aortic 07/2017: EF 25-30%, mult areas of akinesis, grd I DD. 01/2020 EF 25-30%, sev LV dsfxn, valves fine, aortic root 27mm    Outpatient  Medications Prior to Visit  Medication Sig Dispense Refill  . ALPRAZolam (XANAX) 0.25 MG tablet TAKE ONE TABLET BY MOUTH AT BEDTIME AS NEEDED (Patient taking differently: Take 0.25 mg by mouth at bedtime as needed for anxiety.) 30 tablet 5  . aspirin EC 81 MG tablet Take 1 tablet (81 mg total) by mouth daily. Swallow whole. 90 tablet 3  . benazepril (LOTENSIN) 20 MG tablet Take 1 tablet (20 mg total) by mouth daily. 1 tab po bid 180 tablet 3  . Omega-3 Fatty Acids (FISH OIL) 1000 MG CAPS Take 1,000 mg by mouth daily.     Vladimir Faster Glycol-Propyl Glycol (LUBRICANT EYE DROPS) 0.4-0.3 % SOLN Place 1 drop into both eyes 3 (three) times daily as needed (dry/irritated eyes.).    [name] Vitamin D, Cholecalciferol, 400 units CAPS Take 400 Units by mouth daily.     [name] atorvastatin (LIPITOR) 20 MG tablet Take 1 tablet (20 mg total) by mouth daily. (Patient not taking: No sig reported) 30 tablet 0  .  clopidogrel (PLAVIX) 75 MG tablet Take 1 tablet (75 mg total) by mouth daily. (Patient not taking: Reported on 02/28/2020) 30 tablet 0   No facility-administered medications prior to visit.    Allergies  Allergen Reactions  . Entresto [Sacubitril-Valsartan] Hives and Shortness Of Breath  . Amlodipine Swelling    LE swelling  . Contrast Media [Iodinated Diagnostic Agents] Other (See Comments)    Prostate problem had to wear a catheter for two weeks after having dye.   [name] Hydralazine Palpitations and Other (See Comments)    Fatigue+    ROS As per HPI  PE: Vitals with BMI 02/28/2020 02/05/2020 01/10/2020  Height 5\' 10"  5\' 10"  5\' 10"   Weight 158 lbs 3 oz 158 lbs 157 lbs 13 oz  BMI 22.7 32.35 57.32  Systolic 202 542 706  Diastolic 91 90 83  Pulse 74 76 70     Gen: Alert, well appearing.  Patient is oriented to person, place, time, and situation. AFFECT: pleasant, lucid thought and speech. CV: RRR, occ ectopy, no m/r/g.   LUNGS: CTA bilat, nonlabored resps, good aeration in all lung fields. EXT: no  clubbing or cyanosis.  no edema.    LABS:  Lab Results  Component Value Date   TSH 0.680 01/01/2020   Lab Results  Component Value Date   WBC 5.1 01/02/2020   HGB 15.3 01/02/2020   HCT 45.1 01/02/2020   MCV 95.8 01/02/2020   PLT 153 01/02/2020   Lab Results  Component Value Date   CREATININE 0.Daniel 01/02/2020   BUN 17 01/01/2020   NA 137 01/01/2020   K 3.9 01/01/2020   CL 104 01/01/2020   CO2 24 01/01/2020   Lab Results  Component Value Date   ALT 19 01/01/2020   AST 25 01/01/2020   ALKPHOS 74 01/01/2020   BILITOT 0.9 01/01/2020   Lab Results  Component Value Date   CHOL 159 01/02/2020   Lab Results  Component Value Date   HDL 39 (L) 01/02/2020   Lab Results  Component Value Date   LDLCALC 111 (H) 01/02/2020   Lab Results  Component Value Date   TRIG 45 01/02/2020   Lab Results  Component Value Date   CHOLHDL 4.1 01/02/2020   Lab Results  Component Value Date   PSA 0.028 11/02/2018   PSA 0.019 04/27/2018   PSA <0.02 10/15/2015   Lab Results  Component Value Date   HGBA1C 5.2 01/02/2020   IMPRESSION AND PLAN:  1) CVA w/out residual deficit. ASA compliance prior to his CVA was suspect (?missed 25% of doses).  He took ASA + Plavix x 3 wks after his CVA. He chose to not estab with neuro. Cont with ASA 81mg  qd ---he is now 100% compliant with this med.  2) HTN: control not ideal but pt feels generalized weakness if bp consistently <130 syst or 80 diast. Cont benaz 20mg  qd.  3) Anxiety/anxiety-induced insomnia: only occ use of alpraz and this is helpful.  CSC UTD.  4) HLD: historically has not felt well on statins, most recently atorva 20mg .  He is off this med now for 3 wks now, eating low fat/low carb diet and plan is to recheck lipids in 1 mo and he has f/u with his cardiologist 04/01/20. We'll have his labs drawn here in 1 mo and forward them to cardiology per pt's request.  5) Ischemic CM, EF 25-30%: Asymptomatic. He has not tolerated  up-titration of HF meds in the past. Cardiology is following  him closely.  An After Visit Summary was printed and given to the patient.  FOLLOW UP: Return in about 3 months (around 05/28/2020) for routine chronic illness f/u: needs fasting lab visit for the last week of February 2022.  Signed:  Crissie Sickles, MD           02/28/2020

## 2020-03-27 ENCOUNTER — Other Ambulatory Visit: Payer: Self-pay

## 2020-03-27 ENCOUNTER — Ambulatory Visit (INDEPENDENT_AMBULATORY_CARE_PROVIDER_SITE_OTHER): Payer: Medicare Other

## 2020-03-27 DIAGNOSIS — I10 Essential (primary) hypertension: Secondary | ICD-10-CM | POA: Diagnosis not present

## 2020-03-27 DIAGNOSIS — E7800 Pure hypercholesterolemia, unspecified: Secondary | ICD-10-CM | POA: Diagnosis not present

## 2020-03-27 LAB — COMPREHENSIVE METABOLIC PANEL
ALT: 14 U/L (ref 0–53)
AST: 20 U/L (ref 0–37)
Albumin: 4.6 g/dL (ref 3.5–5.2)
Alkaline Phosphatase: 75 U/L (ref 39–117)
BUN: 19 mg/dL (ref 6–23)
CO2: 29 mEq/L (ref 19–32)
Calcium: 10.2 mg/dL (ref 8.4–10.5)
Chloride: 103 mEq/L (ref 96–112)
Creatinine, Ser: 0.93 mg/dL (ref 0.40–1.50)
GFR: 80.67 mL/min (ref >60.00)
Glucose, Bld: 90 mg/dL (ref 70–99)
Potassium: 4.7 mEq/L (ref 3.5–5.1)
Sodium: 138 mEq/L (ref 135–145)
Total Bilirubin: 1.1 mg/dL (ref 0.2–1.2)
Total Protein: 6.9 g/dL (ref 6.0–8.3)

## 2020-03-27 LAB — LIPID PANEL
Cholesterol: 175 mg/dL (ref 0–200)
HDL: 46.3 mg/dL (ref >39.00)
LDL Cholesterol: 116 mg/dL — ABNORMAL HIGH (ref 0–99)
NonHDL: 129.01
Total CHOL/HDL Ratio: 4
Triglycerides: 63 mg/dL (ref 0.0–149.0)
VLDL: 12.6 mg/dL (ref 0.0–40.0)

## 2020-03-27 NOTE — Progress Notes (Signed)
Lab results to be sent to [name] at [phone]

## 2020-03-29 ENCOUNTER — Encounter: Payer: Self-pay | Admitting: Family Medicine

## 2020-03-30 NOTE — Progress Notes (Signed)
Cardiology Office Note   Date:  04/01/2020   ID:  [name], DOB 01/20/1946, MRN [id number]  PCP:  [name], MD  Cardiologist:   [name], MD   Chief Complaint  Patient presents with  . Dizziness      History of Present Illness: Daniel [name]. is a 75 y.o. male who presents for follow up of CAD s/p CABG LIMA to D1, SVG to LAD, SVG to PDA on 05/12/2016, HLD intolerant to statin, HTN, ICM with baseline EF 30-35%, and h/o prostate CA.His last cardiac catheterization on 05/09/2016 showed EF 20-25%, akinesis of the distal inferoapical segment with significant hypokinesis globally consistent with ischemiccardiomyopathy, multivessel disease with 70% followed by total occlusion of large LAD with distal collateral arteries, 50% proximal left circumflex stenosis with total occlusion of distal left circumflex, total occlusion of mid RCA with distal collateral artery. Bypass surgery was recommended and he eventually underwent successful CABG 3 by Dr. Roxan Hockey on 05/12/2016 with LIMA to diagonal, SVG to LAD, SVG to PDA. He developed postoperative atrial fibrillation and was treated with IV amiodarone with conversion to sinus rhythm prior to discharge. He had a follow-up echocardiogram on 08/05/2016 which revealed LV EF remains low at 30-35% however slightly improved from his previous 20-25%.  He was placed on carvedilol and Entresto, spironolactone was discontinued due to elevated potassium level. Unfortunately, he went back to the hospital on 11/20/2016 with shortness breath, diffuse rash and presyncope. He was given prednisone for his rash. He stopped the [facility] and restarted ACE inhibitor as he was sure the rash was related to the former.  Last EF in July was 25 - 30%.   He could not take the BiDil.  His blood pressure dropped too low.  He had a low heart rate and we stopped Coreg.  He also stop spironolactone as he thought it was making his heart skip . He now status post  ICD.  He returns for follow up.  Since I last saw him he has had leg weakness and lightheadedness that he associates with his blood pressure when it is lower than 140.  He might skip his Lotensin or take half of it.  He is not having any new shortness of breath, PND or orthopnea.  He is not having any new palpitations, presyncope or syncope.  He is getting ready plant beets.     Past Medical History:  Diagnosis Date  . Anxiety   . Atrial fibrillation ([place])    Limited episode, emergency room, June, 2013, spontaneous conversion to sinus rhythm, was evaluated By [name] 2013- no further visits.  Post-op (CABG) a-fib, converted on amio but pt self d/c'd this med due to DOE/side effect profile.  [name] CAD (coronary artery disease) 05/2016   a. 05/2016: NSTEMI with cath showing 3-vessel disease. Underwent CABG on 05/12/16 with LIMA-D1, SVG-LAD, and SVG-PDA.   [name] COPD (chronic obstructive pulmonary disease) ([place]) fall 2016   Bullous changes noted on lower lung images of CT abd done by urology  . Diverticulitis   . Double vision 01/2020   MG ab w/u NEG.  +4th nerve palsy/mid brain CVA, small vessels dz->DAPT  . GERD (gastroesophageal reflux disease)   . Has received pneumococcal vaccination   . History of CVA (cerebrovascular accident) without residual deficits 01/2020   L midbrain, with mild L 4th CN palsy-->small vessel dz->DAPT x 3 wks then ASA alone (pt's compliance with ASA PRIOR to CVA was questionable). Unable  to have MRI due to having shrapnel in forehead.  . Hyperlipidemia    statin intolerant. Pt declined advanced lipid clinic referral 08/2019 and 03/2020.  [name] Hypertension   . Ischemic cardiomyopathy 05/2016   EF 20-25% at the time of NSTEMI/CABG.  Repeat EF 07/2016 30-35%.  07/2017 EF 25-30%, pt intol of entresto, bidil, and BB--for ICD 03/2019.  . Microscopic hematuria Fall 2016   CT nl except nonobstructing stones.  Cystoscopy normal 12/30/14 ([name])  . Nephrolithiasis    11 mm stone on R,  2 mm stone on L, ureters clear  . Non-STEMI (non-ST elevated myocardial infarction) ([place]) 05/2016   with impaired LV function  . Prostatic adenocarcinoma ([place]) 02/2015   No evidence of metastatic dz on CT pelv 02/2015.  Urol ([name]) at [facility]; [name] referred him to [name] 03/2015: patient got bilat nerve sparing , robot assisted laparoscopic radical prostatectomy and pelvic lymphadenectomy.  Urol f/u, PSA undetectable on repeat 04/22/16 and 10/28/16; pt chose PSA surveill over adjuv rad tx.   . Tobacco dependence    down to 1-2 cigs per day as of 11/2015.  Restarted 08/2016.  [name] Urinary retention 05/2015   occurred s/p foley removal    Past Surgical History:  Procedure Laterality Date  . aortic ultrasound  07/2012   NO AAA  . COLONOSCOPY  approx 2011 per pt   Done by surgeon in [place] after a bout of diverticulitis; normal per pt report--was told to repeat in 10 yrs.  . CORONARY ARTERY BYPASS GRAFT N/A 05/12/2016   Procedure: CORONARY ARTERY BYPASS GRAFTING (CABG) TIMES THREE USING LEFT INTERNAL MAMMARY ARTERY AND RIGHT SAPHENOUS LEG VEIN HARVESTED ENDOSCOPICALLY;  Surgeon: [name], MD;  Location: [place];  Service: Open Heart Surgery;  Laterality: N/A;  . ICD IMPLANT N/A 04/12/2019   Procedure: ICD IMPLANT;  Surgeon: [place] Lance, MD;  Location: [place] [facility];  Service: Cardiovascular;  Laterality: N/A;  . INGUINAL HERNIA REPAIR  2003 and 2004   Both sides.  [name] LAPAROSCOPY  2017  . LEFT HEART CATH AND CORONARY ANGIOGRAPHY N/A 05/09/2016   Procedure: Left Heart Cath and Coronary Angiography;  Surgeon: [name], MD;  Location: [place] [facility];  Service: Cardiovascular;  Laterality: N/A;  . LITHOTRIPSY  2004   Dr. [place] Pink in [place].  [name] LYMPHADENECTOMY Bilateral 04/30/2015   Procedure: PELVIC LYMPHADENECTOMY;  Surgeon: [name], MD;  [facility];  Service: Urology;  Laterality: Bilateral;  . PROSTATE BIOPSY  02/05/15   Prostate adenocarcinoma: pt  considering treatment options as of 02/19/15  . ROBOT ASSISTED LAPAROSCOPIC RADICAL PROSTATECTOMY N/A 04/30/2015   Procedure: XI ROBOTIC ASSISTED LAPAROSCOPIC RADICAL PROSTATECTOMY LEVEL 2;  Surgeon: [name], MD;  [facility]: [facility];  Service: Urology;  Laterality: N/A;  . TEE WITHOUT CARDIOVERSION N/A 05/12/2016   Procedure: TRANSESOPHAGEAL ECHOCARDIOGRAM (TEE);  Surgeon: [name], MD;  Location: [place];  Service: Open Heart Surgery;  Laterality: N/A;  . TRANSTHORACIC ECHOCARDIOGRAM  03/22/2006; 05/2016; 07/2016; 07/2017; 01/02/20   2008: EF 65%, normal valves, no wall motion abnormalties, LA size normal.  05/2016 in context of non-STEMI, EF 20-25%, mild AV and MV regurg.  08/05/16: EF 30-35%, akinesis of mid lateral myoc, grd II DD, mild aortic 07/2017: EF 25-30%, mult areas of akinesis, grd I DD. 01/2020 EF 25-30%, sev LV dsfxn, valves fine, aortic root 44mm     Current Outpatient Medications  Medication Sig Dispense Refill  . ALPRAZolam Duanne Moron)  0.25 MG tablet TAKE ONE TABLET BY MOUTH AT BEDTIME AS NEEDED (Patient taking differently: Take 0.25 mg by mouth at bedtime as needed for anxiety.) 30 tablet 5  . aspirin EC 81 MG tablet Take 1 tablet (81 mg total) by mouth daily. Swallow whole. 90 tablet 3  . benazepril (LOTENSIN) 20 MG tablet Take 1 tablet (20 mg total) by mouth daily. 1 tab po bid 180 tablet 3  . Omega-3 Fatty Acids (FISH OIL) 1000 MG CAPS Take 1,000 mg by mouth daily.     Vladimir Faster Glycol-Propyl Glycol (LUBRICANT EYE DROPS) 0.4-0.3 % SOLN Place 1 drop into both eyes 3 (three) times daily as needed (dry/irritated eyes.).    [name] Vitamin D, Cholecalciferol, 400 units CAPS Take 400 Units by mouth daily.      No current facility-administered medications for this visit.    Allergies:   Entresto [sacubitril-valsartan], Amlodipine, Contrast media [iodinated diagnostic agents], and Hydralazine    ROS:  Please see the history of present illness.   Otherwise, review of systems are  positive for none.   All other systems are reviewed and negative.    PHYSICAL EXAM: VS:  BP (!) 150/98   Pulse 92   Ht 5\' 10"  (1.778 m)   Wt 157 lb (71.2 kg)   BMI 22.53 kg/m  , BMI Body mass index is 22.53 kg/m.  GENERAL:  Well appearing NECK:  No jugular venous distention, waveform within normal limits, carotid upstroke brisk and symmetric, no bruits, no thyromegaly LUNGS:  Clear to auscultation bilaterally CHEST: Well-healed ICD pocket HEART:  PMI not displaced or sustained,S1 and S2 within normal limits, no S3, no S4, no clicks, no rubs, no murmurs ABD:  Flat, positive bowel sounds normal in frequency in pitch, no bruits, no rebound, no guarding, no midline pulsatile mass, no hepatomegaly, no splenomegaly EXT:  2 plus pulses throughout, no edema, no cyanosis no clubbing   EKG:  EKG is not done today   Recent Labs: 01/01/2020: TSH 0.680 01/02/2020: Hemoglobin 15.3; Platelets 153 03/27/2020: ALT 14; BUN 19; Creatinine, Ser 0.93; Potassium 4.7; Sodium 138    Lipid Panel    Component Value Date/Time   CHOL 175 03/27/2020 1000   CHOL 137 09/16/2016 0858   TRIG 63.0 03/27/2020 1000   HDL 46.30 03/27/2020 1000   HDL 35 (L) 09/16/2016 0858   CHOLHDL 4 03/27/2020 1000   VLDL 12.6 03/27/2020 1000   LDLCALC 116 (H) 03/27/2020 1000   LDLCALC 88 09/16/2016 0858      Wt Readings from Last 3 Encounters:  04/01/20 157 lb (71.2 kg)  02/28/20 158 lb 3.2 oz (71.8 kg)  02/05/20 158 lb (71.7 kg)      Other studies Reviewed: Additional studies/ records that were reviewed today include: None Review of the above records demonstrates:  See elsewhere  ASSESSMENT AND PLAN:  CAD s/p CABG:    The patient has no new sypmtoms.  No further cardiovascular testing is indicated.  We will continue with aggressive risk reduction and meds as listed.elow.  No further imaging.    Ischemic cardiomyopathy:     Unfortunately he has not tolerated med titration.  We have been through the meds as  listed above.  He agrees to continue to try to take his Lotensin and he will consider taking a half twice a day rather than going to 10 mg.  Hypertension:   We discussed some of the reasoning to try to tolerate a low blood pressure but he is  unable to tolerate meds as above.   Hyperlipidemia:    LDL was 116.  He wanted to come off statins at the last visit.      Current medicines are reviewed at length with the patient today.  The patient does not have concerns regarding medicines.  The following changes have been made: None  Labs/ tests ordered today include:  None No orders of the defined types were placed in this encounter.    Disposition:   FU with me in 6 months.   Signed, [name], MD  04/01/2020 12:23 PM    [facility]

## 2020-04-01 ENCOUNTER — Other Ambulatory Visit: Payer: Self-pay

## 2020-04-01 ENCOUNTER — Encounter: Payer: Self-pay | Admitting: Cardiology

## 2020-04-01 ENCOUNTER — Ambulatory Visit: Payer: Medicare Other | Admitting: Cardiology

## 2020-04-01 VITALS — BP 150/98 | HR 92 | Ht 70.0 in | Wt 157.0 lb

## 2020-04-01 DIAGNOSIS — I255 Ischemic cardiomyopathy: Secondary | ICD-10-CM | POA: Diagnosis not present

## 2020-04-01 DIAGNOSIS — E785 Hyperlipidemia, unspecified: Secondary | ICD-10-CM | POA: Diagnosis not present

## 2020-04-01 DIAGNOSIS — I10 Essential (primary) hypertension: Secondary | ICD-10-CM | POA: Diagnosis not present

## 2020-04-01 DIAGNOSIS — I480 Paroxysmal atrial fibrillation: Secondary | ICD-10-CM

## 2020-04-01 DIAGNOSIS — I2510 Atherosclerotic heart disease of native coronary artery without angina pectoris: Secondary | ICD-10-CM

## 2020-04-01 NOTE — Patient Instructions (Signed)
Medication Instructions:  The current medical regimen is effective;  continue present plan and medications.  *If you need a refill on your cardiac medications before your next appointment, please call your pharmacy*  Follow-Up: At [facility], you and your health needs are our priority.  As part of our continuing mission to provide you with exceptional heart care, we have created designated Provider Care Teams.  These Care Teams include your primary Cardiologist (physician) and Advanced Practice Providers (APPs -  Physician Assistants and Nurse Practitioners) who all work together to provide you with the care you need, when you need it.  We recommend signing up for the patient portal called "MyChart".  Sign up information is provided on this After Visit Summary.  MyChart is used to connect with patients for Virtual Visits (Telemedicine).  Patients are able to view lab/test results, encounter notes, upcoming appointments, etc.  Non-urgent messages can be sent to your provider as well.   To learn more about what you can do with MyChart, go to [email or web address].    Your next appointment:   6 month(s)  The format for your next appointment:   In Person  Provider:   Tobe Hochrein, MD   Thank you for choosing [facility]!!     

## 2020-04-10 ENCOUNTER — Ambulatory Visit (INDEPENDENT_AMBULATORY_CARE_PROVIDER_SITE_OTHER): Payer: Medicare Other

## 2020-04-10 DIAGNOSIS — I255 Ischemic cardiomyopathy: Secondary | ICD-10-CM | POA: Diagnosis not present

## 2020-04-13 LAB — CUP PACEART REMOTE DEVICE CHECK
Battery Remaining Longevity: 82 mo
Battery Remaining Percentage: 85 %
Battery Voltage: 2.98 V
Brady Statistic AP VP Percent: 27 %
Brady Statistic AP VS Percent: 63 %
Brady Statistic AS VP Percent: 1 %
Brady Statistic AS VS Percent: 8.8 %
Brady Statistic RA Percent Paced: 89 %
Brady Statistic RV Percent Paced: 27 %
Date Time Interrogation Session: 20220311021047
HighPow Impedance: 64 Ohm
Implantable Lead Implant Date: 20210312
Implantable Lead Implant Date: 20210312
Implantable Lead Location: 753859
Implantable Lead Location: 753860
Implantable Pulse Generator Implant Date: 20210312
Lead Channel Impedance Value: 410 Ohm
Lead Channel Impedance Value: 440 Ohm
Lead Channel Pacing Threshold Amplitude: 0.5 V
Lead Channel Pacing Threshold Amplitude: 1.125 V
Lead Channel Pacing Threshold Pulse Width: 0.5 ms
Lead Channel Pacing Threshold Pulse Width: 0.5 ms
Lead Channel Sensing Intrinsic Amplitude: 12 mV
Lead Channel Sensing Intrinsic Amplitude: 2.8 mV
Lead Channel Setting Pacing Amplitude: 1.375
Lead Channel Setting Pacing Amplitude: 1.5 V
Lead Channel Setting Pacing Pulse Width: 0.5 ms
Lead Channel Setting Sensing Sensitivity: 0.5 mV
Pulse Gen Serial Number: 111016652

## 2020-04-20 NOTE — Progress Notes (Signed)
Remote ICD transmission.   

## 2020-05-06 ENCOUNTER — Ambulatory Visit: Payer: Medicare Other

## 2020-05-08 ENCOUNTER — Telehealth: Payer: Self-pay | Admitting: Family Medicine

## 2020-05-08 NOTE — Telephone Encounter (Signed)
LM for pt to return call regarding rx.  

## 2020-05-08 NOTE — Telephone Encounter (Signed)
Requesting: alprazolam Contract: 08/08/19 UDS: none Last Visit: 02/28/20 Next Visit: advised to f/u 05/28/20 Last Refill: 09/25/19 (30,5)  Please Advise. Medication Pending

## 2020-05-11 NOTE — Telephone Encounter (Signed)
Left detailed message advising refill sent, okay per DPR 

## 2020-05-11 NOTE — Telephone Encounter (Signed)
Patient returned call regarding RX. Please call patient to advise.

## 2020-05-19 ENCOUNTER — Encounter: Payer: Self-pay | Admitting: Cardiology

## 2020-05-31 DIAGNOSIS — H2513 Age-related nuclear cataract, bilateral: Secondary | ICD-10-CM | POA: Diagnosis not present

## 2020-05-31 DIAGNOSIS — H40033 Anatomical narrow angle, bilateral: Secondary | ICD-10-CM | POA: Diagnosis not present

## 2020-06-09 DIAGNOSIS — H04123 Dry eye syndrome of bilateral lacrimal glands: Secondary | ICD-10-CM | POA: Diagnosis not present

## 2020-07-02 ENCOUNTER — Telehealth: Payer: Self-pay | Admitting: Family Medicine

## 2020-07-02 NOTE — Telephone Encounter (Signed)
Left message for patient to schedule Annual Wellness Visit.  Please schedule with Nurse Health Advisor Juleen Starr, RN at [facility].

## 2020-07-10 ENCOUNTER — Ambulatory Visit (INDEPENDENT_AMBULATORY_CARE_PROVIDER_SITE_OTHER): Payer: Medicare Other

## 2020-07-10 DIAGNOSIS — I255 Ischemic cardiomyopathy: Secondary | ICD-10-CM | POA: Diagnosis not present

## 2020-07-10 LAB — CUP PACEART REMOTE DEVICE CHECK
Battery Remaining Longevity: 82 mo
Battery Remaining Percentage: 82 %
Battery Voltage: 2.98 V
Brady Statistic AP VP Percent: 29 %
Brady Statistic AP VS Percent: 62 %
Brady Statistic AS VP Percent: 1 %
Brady Statistic AS VS Percent: 8 %
Brady Statistic RA Percent Paced: 90 %
Brady Statistic RV Percent Paced: 29 %
Date Time Interrogation Session: 20220610030101
HighPow Impedance: 65 Ohm
Implantable Lead Implant Date: 20210312
Implantable Lead Implant Date: 20210312
Implantable Lead Location: 753859
Implantable Lead Location: 753860
Implantable Pulse Generator Implant Date: 20210312
Lead Channel Impedance Value: 410 Ohm
Lead Channel Impedance Value: 440 Ohm
Lead Channel Pacing Threshold Amplitude: 0.375 V
Lead Channel Pacing Threshold Amplitude: 1.125 V
Lead Channel Pacing Threshold Pulse Width: 0.5 ms
Lead Channel Pacing Threshold Pulse Width: 0.5 ms
Lead Channel Sensing Intrinsic Amplitude: 12 mV
Lead Channel Sensing Intrinsic Amplitude: 4.8 mV
Lead Channel Setting Pacing Amplitude: 1.375
Lead Channel Setting Pacing Amplitude: 1.375
Lead Channel Setting Pacing Pulse Width: 0.5 ms
Lead Channel Setting Sensing Sensitivity: 0.5 mV
Pulse Gen Serial Number: 111016652

## 2020-07-28 NOTE — Progress Notes (Signed)
Remote ICD transmission.   

## 2020-08-24 ENCOUNTER — Encounter: Payer: Self-pay | Admitting: Family Medicine

## 2020-08-25 ENCOUNTER — Telehealth: Payer: Self-pay | Admitting: Family Medicine

## 2020-08-25 NOTE — Telephone Encounter (Signed)
Left message for patient to call back and schedule Medicare Annual Wellness Visit (AWV).   Please offer to do virtually or by telephone.   Last AWV:09/27/2016  Please schedule at anytime with Nurse Health Advisor.

## 2020-09-29 ENCOUNTER — Ambulatory Visit (INDEPENDENT_AMBULATORY_CARE_PROVIDER_SITE_OTHER): Payer: Medicare Other | Admitting: Family Medicine

## 2020-09-29 ENCOUNTER — Encounter: Payer: Self-pay | Admitting: Family Medicine

## 2020-09-29 ENCOUNTER — Other Ambulatory Visit: Payer: Self-pay

## 2020-09-29 VITALS — BP 156/83 | HR 79 | Temp 97.9°F | Ht 70.0 in | Wt 154.2 lb

## 2020-09-29 DIAGNOSIS — R5383 Other fatigue: Secondary | ICD-10-CM | POA: Diagnosis not present

## 2020-09-29 DIAGNOSIS — Z8546 Personal history of malignant neoplasm of prostate: Secondary | ICD-10-CM

## 2020-09-29 DIAGNOSIS — Z125 Encounter for screening for malignant neoplasm of prostate: Secondary | ICD-10-CM

## 2020-09-29 LAB — COMPREHENSIVE METABOLIC PANEL
ALT: 16 U/L (ref 0–53)
AST: 28 U/L (ref 0–37)
Albumin: 4 g/dL (ref 3.5–5.2)
Alkaline Phosphatase: 70 U/L (ref 39–117)
BUN: 21 mg/dL (ref 6–23)
CO2: 22 mEq/L (ref 19–32)
Calcium: 8.9 mg/dL (ref 8.4–10.5)
Chloride: 102 mEq/L (ref 96–112)
Creatinine, Ser: 1.04 mg/dL (ref 0.40–1.50)
GFR: 70.29 mL/min (ref >60.00)
Glucose, Bld: 70 mg/dL (ref 70–99)
Potassium: 3.9 mEq/L (ref 3.5–5.1)
Sodium: 135 mEq/L (ref 135–145)
Total Bilirubin: 0.6 mg/dL (ref 0.2–1.2)
Total Protein: 6.4 g/dL (ref 6.0–8.3)

## 2020-09-29 LAB — CBC WITH DIFFERENTIAL/PLATELET
Basophils Absolute: 0 10*3/uL (ref 0.0–0.1)
Basophils Relative: 0.5 % (ref 0.0–3.0)
Eosinophils Absolute: 0 10*3/uL (ref 0.0–0.7)
Eosinophils Relative: 0.1 % (ref 0.0–5.0)
HCT: 47.1 % (ref 39.0–52.0)
Hemoglobin: 15.5 g/dL (ref 13.0–17.0)
Lymphocytes Relative: 27 % (ref 12.0–46.0)
Lymphs Abs: 0.7 10*3/uL (ref 0.7–4.0)
MCHC: 33 g/dL (ref 30.0–36.0)
MCV: 94.7 fl (ref 78.0–100.0)
Monocytes Absolute: 0.3 10*3/uL (ref 0.1–1.0)
Monocytes Relative: 10.7 % (ref 3.0–12.0)
Neutro Abs: 1.6 10*3/uL (ref 1.4–7.7)
Neutrophils Relative %: 61.7 % (ref 43.0–77.0)
Platelets: 73 10*3/uL — ABNORMAL LOW (ref 150.0–400.0)
RBC: 4.97 Mil/uL (ref 4.22–5.81)
RDW: 15 % (ref 11.5–15.5)
WBC: 2.6 10*3/uL — ABNORMAL LOW (ref 4.0–10.5)

## 2020-09-29 LAB — TSH: TSH: 0.48 u[IU]/mL (ref 0.35–5.50)

## 2020-09-29 LAB — PSA, MEDICARE: PSA: 0.07 ng/ml — ABNORMAL LOW (ref 0.10–4.00)

## 2020-09-29 NOTE — Progress Notes (Signed)
OFFICE VISIT  09/29/2020  CC:  Chief Complaint  Patient presents with   Fatigue, weakness    X 2 days;     HPI:    Patient is a 75 y.o. Caucasian male who presents for "weakness and fatigue". Says energy "just left me" 2 d/a.  Some HA at first but this resolved. When walking he got very tired.  At onset felt some mild orthostatic dizziness.  Otherwise no dizziness or presyncope.  Says HR faster than his usual (around 80) but no palpitations. No body aches, no fevers.  No known sick contacts.  No cough or wheezing. No SOB, DOE, CP, nausea, arm pain, jaw pain, or diarrhea.  No dysuria or groin pain or flank pain.  Then he says he actually has felt easy fatigue with activities for a few months or so. BP at home 120s-140s/80. Historically he has felt generalized weakness unless bp in 140s syst.   No recent changes in activity or PO intake prior to onset of sx's.    He has CAD with hx of MI+ CABG 4 yrs ago, LV EF stable at 25-30% on most recent echo 01/2020, pt intol of multiple med therapies for HF and has even had some ongoing periodic low bp on current lotensin '10mg'$ . He has ICD. He has chosen to not take statin.  Takes ASA '81mg'$  qd. CMET and CBC normal 6 mo ago.  Cardiology f/u 04/01/20-->no changes in med regimen except split lotensin jn half and take bid instead of '10mg'$  qd.  ROS as above, plus--> no rashes, no melena/hematochezia.  No polyuria or polydipsia.  No focal weakness, paresthesias, or tremors.  No acute vision or hearing abnormalities.   No recent changes in lower legs.    Past Medical History:  Diagnosis Date   Anxiety    Atrial fibrillation [facility])    Limited episode, emergency room, June, 2013, spontaneous conversion to sinus rhythm, was evaluated By [name] 2013- no further visits.  Post-op (CABG) a-fib, converted on amio but pt self d/c'd this med due to DOE/side effect profile.   CAD (coronary artery disease) 05/2016   a. 05/2016: NSTEMI with cath showing 3-vessel  disease. Underwent CABG on 05/12/16 with LIMA-D1, SVG-LAD, and SVG-PDA.    COPD (chronic obstructive pulmonary disease) ([place]) fall 2016   Bullous changes noted on lower lung images of CT abd done by urology   Diverticulitis    Double vision 01/2020   MG ab w/u NEG.  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 Unable to have MRI due to having shrapnel in forehead.   Hyperlipidemia    statin intolerant. Pt declined advanced lipid clinic referral 08/2019 and 03/2020.   Hypertension    Ischemic cardiomyopathy 05/2016   EF 20-25% at the time of NSTEMI/CABG.  Repeat EF 07/2016 30-35%.  07/2017 EF 25-30%, pt intol of entresto, bidil, and BB--for ICD 03/2019.   Microscopic hematuria Fall 2016   CT nl except nonobstructing stones.  Cystoscopy normal 12/30/14 (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  Urol ([name]) at [facility]; [name] referred him to [name] 03/2015: patient got bilat nerve sparing , robot assisted laparoscopic radical prostatectomy and pelvic lymphadenectomy.  Urol f/u, PSA undetectable on repeat 04/22/16 and 10/28/16; pt chose PSA surveill over adjuv rad tx. PSA undetectable as of 2021   Tobacco dependence    down to 1-2 cigs per day as of 11/2015.  Restarted 08/2016.   Urinary retention 05/2015   occurred s/p foley removal    Past Surgical History:  Procedure Laterality Date   aortic ultrasound  07/2012   NO AAA   COLONOSCOPY  approx 2011  per pt   Done by surgeon in [place] after a bout of diverticulitis; normal per pt report--was told to repeat in 10 yrs.   CORONARY ARTERY BYPASS GRAFT N/A 05/12/2016   Procedure: CORONARY ARTERY BYPASS GRAFTING (CABG) TIMES THREE USING LEFT INTERNAL MAMMARY ARTERY AND RIGHT SAPHENOUS LEG VEIN HARVESTED ENDOSCOPICALLY;  Surgeon: [name], MD;  Location: [place];  Service: Open Heart Surgery;  Laterality: N/A;   ICD IMPLANT N/A 04/12/2019   Procedure: ICD IMPLANT;  Surgeon: Evans Lance, MD;  Location: [place] [facility];  Service: Cardiovascular;  Laterality: N/A;   INGUINAL HERNIA REPAIR  2003 and 2004   Both sides.   LAPAROSCOPY  2017   LEFT HEART CATH AND CORONARY ANGIOGRAPHY N/A 05/09/2016   Procedure: Left Heart Cath and Coronary Angiography;  Surgeon: [name], MD;  Location: [place] [facility];  Service: Cardiovascular;  Laterality: N/A;   LITHOTRIPSY  2004   Dr. [place] Pink in [place].   LYMPHADENECTOMY Bilateral 04/30/2015   Procedure: PELVIC LYMPHADENECTOMY;  Surgeon: [name], MD;  Location: [facility];  Service: Urology;  Laterality: Bilateral;   PROSTATE BIOPSY  02/05/15   Prostate adenocarcinoma: pt considering treatment options as of 02/19/15   ROBOT ASSISTED LAPAROSCOPIC RADICAL PROSTATECTOMY N/A 04/30/2015   Procedure: XI ROBOTIC ASSISTED LAPAROSCOPIC RADICAL PROSTATECTOMY LEVEL 2;  Surgeon: [name], MD;  Location: [facility];  Service: Urology;  Laterality: N/A;   TEE WITHOUT CARDIOVERSION N/A 05/12/2016   Procedure: TRANSESOPHAGEAL ECHOCARDIOGRAM (TEE);  Surgeon: [name], MD;  Location: [place];  Service: Open Heart Surgery;  Laterality: N/A;   TRANSTHORACIC ECHOCARDIOGRAM  03/22/2006; 05/2016; 07/2016; 07/2017; 01/02/20   2008: EF 65%, normal valves, no wall motion abnormalties, LA size normal.  05/2016 in context of non-STEMI, EF 20-25%, mild AV and MV regurg.  08/05/16: EF 30-35%, akinesis of mid lateral myoc, grd II DD, mild aortic 07/2017: EF 25-30%, mult areas of  akinesis, grd I DD. 01/2020 EF 25-30%, sev LV dsfxn, valves fine, aortic root 39m    Outpatient Medications Prior to Visit  Medication Sig Dispense Refill   ALPRAZolam (XANAX) 0.25 MG tablet TAKE ONE TABLET BY MOUTH AT BEDTIME AS NEEDED 30 tablet 5   aspirin EC 81 MG tablet Take 1 tablet (81 mg total) by mouth daily. Swallow whole. 90 tablet 3   benazepril (LOTENSIN) 20 MG tablet Take 1 tablet (20 mg total) by mouth daily. 1 tab po bid 180 tablet 3   Omega-3 Fatty Acids (FISH OIL) 1000 MG CAPS Take 1,000 mg by mouth daily.      Polyethyl Glycol-Propyl Glycol (LUBRICANT EYE DROPS) 0.4-0.3 % SOLN Place 1 drop into both eyes 3 (three) times daily as needed (dry/irritated eyes.).     Vitamin D, Cholecalciferol, 400  units CAPS Take 400 Units by mouth daily.      No facility-administered medications prior to visit.    Allergies  Allergen Reactions   Entresto [Sacubitril-Valsartan] Hives and Shortness Of Breath   Amlodipine Swelling    LE swelling   Contrast Media [Iodinated Diagnostic Agents] Other (See Comments)    Prostate problem had to wear a catheter for two weeks after having dye.    Hydralazine Palpitations and Other (See Comments)    Fatigue+    ROS As per HPI  PE: Vitals with BMI 09/29/2020 04/01/2020 02/28/2020  Height '5\' 10"'$  '5\' 10"'$  '5\' 10"'$   Weight 154 lbs 3 oz 157 lbs 158 lbs 3 oz  BMI 22.13 [id number]  Systolic A999333 [id number]  Diastolic 83 98 91  Pulse 79 92 74  Gen: Alert, well appearing.  Patient is oriented to person, place, time, and situation. AFFECT: pleasant, lucid thought and speech. [id number]: no injection, icteris, swelling, or exudate.  EOMI, PERRLA. Mouth: lips without lesion/swelling.  Oral mucosa pink and moist. Oropharynx without erythema, exudate, or swelling.  Neck: soft, no LAD, TM or tenderness. CV: RRR (rate about 75 by me), no m/r/g.   LUNGS: CTA bilat, nonlabored resps, good aeration in all lung fields. ABD: soft, NT, ND, BS normal.  No  hepatospenomegaly or mass.  No bruits. +pulsatile abd aorta. EXT: no clubbing or cyanosis.  no edema.    LABS:    Chemistry      Component Value Date/Time   NA 138 03/27/2020 1000   NA 139 02/26/2019 1026   K 4.7 03/27/2020 1000   CL 103 03/27/2020 1000   CO2 29 03/27/2020 1000   BUN 19 03/27/2020 1000   BUN 15 02/26/2019 1026   CREATININE 0.93 03/27/2020 1000   CREATININE 0.95 06/20/2017 1436      Component Value Date/Time   CALCIUM 10.2 03/27/2020 1000   ALKPHOS 75 03/27/2020 1000   AST 20 03/27/2020 1000   ALT 14 03/27/2020 1000   BILITOT 1.1 03/27/2020 1000   BILITOT 0.8 09/16/2016 0858     Lab Results  Component Value Date   WBC 5.1 01/02/2020   HGB 15.3 01/02/2020   HCT 45.1 01/02/2020   MCV 95.8 01/02/2020   PLT 153 01/02/2020   Lab Results  Component Value Date   TSH 0.680 01/01/2020   Lab Results  Component Value Date   HGBA1C 5.2 01/02/2020   Lab Results  Component Value Date   PSA 0.047 12/11/2019   PSA 0.028 11/02/2018   PSA 0.019 04/27/2018   IMPRESSION AND PLAN:  Acute-on-chronic fatigue. 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 No clear sign of any acute illness. Will check cbc, cmet, tsh today. Also, he is 5 yrs out from prostate ca and PSA had been undetectable until most recent check with [name] a few months ago is was 0.2 per pt's recollection.  Will recheck PSA today and get urol records.  An After Visit Summary was printed and given to the patient.  FOLLOW UP: Return in about 1 week (around 10/06/2020) for f/u fatigue.  Signed:  Crissie Sickles, MD           09/29/2020

## 2020-10-01 ENCOUNTER — Other Ambulatory Visit: Payer: Self-pay

## 2020-10-01 ENCOUNTER — Emergency Department (HOSPITAL_BASED_OUTPATIENT_CLINIC_OR_DEPARTMENT_OTHER): Payer: Medicare Other

## 2020-10-01 ENCOUNTER — Encounter (HOSPITAL_BASED_OUTPATIENT_CLINIC_OR_DEPARTMENT_OTHER): Payer: Self-pay | Admitting: Emergency Medicine

## 2020-10-01 ENCOUNTER — Emergency Department (HOSPITAL_BASED_OUTPATIENT_CLINIC_OR_DEPARTMENT_OTHER)
Admission: EM | Admit: 2020-10-01 | Discharge: 2020-10-01 | Disposition: A | Discharge status: Home / Self Care | Payer: Medicare Other | Attending: Emergency Medicine | Admitting: Emergency Medicine

## 2020-10-01 DIAGNOSIS — I5022 Chronic systolic (congestive) heart failure: Secondary | ICD-10-CM | POA: Diagnosis not present

## 2020-10-01 DIAGNOSIS — F17210 Nicotine dependence, cigarettes, uncomplicated: Secondary | ICD-10-CM | POA: Diagnosis not present

## 2020-10-01 DIAGNOSIS — R0602 Shortness of breath: Secondary | ICD-10-CM | POA: Diagnosis present

## 2020-10-01 DIAGNOSIS — Z951 Presence of aortocoronary bypass graft: Secondary | ICD-10-CM | POA: Diagnosis not present

## 2020-10-01 DIAGNOSIS — I110 Hypertensive heart disease with heart failure: Secondary | ICD-10-CM | POA: Diagnosis not present

## 2020-10-01 DIAGNOSIS — R079 Chest pain, unspecified: Secondary | ICD-10-CM | POA: Diagnosis not present

## 2020-10-01 DIAGNOSIS — U071 COVID-19: Secondary | ICD-10-CM | POA: Insufficient documentation

## 2020-10-01 DIAGNOSIS — I2510 Atherosclerotic heart disease of native coronary artery without angina pectoris: Secondary | ICD-10-CM | POA: Insufficient documentation

## 2020-10-01 DIAGNOSIS — Z8546 Personal history of malignant neoplasm of prostate: Secondary | ICD-10-CM | POA: Insufficient documentation

## 2020-10-01 DIAGNOSIS — R531 Weakness: Secondary | ICD-10-CM | POA: Diagnosis not present

## 2020-10-01 DIAGNOSIS — J449 Chronic obstructive pulmonary disease, unspecified: Secondary | ICD-10-CM | POA: Diagnosis not present

## 2020-10-01 LAB — COMPREHENSIVE METABOLIC PANEL
ALT: 20 U/L (ref 0–44)
AST: 38 U/L (ref 15–41)
Albumin: 4 g/dL (ref 3.5–5.0)
Alkaline Phosphatase: 65 U/L (ref 38–126)
Anion gap: 7 (ref 5–15)
BUN: 24 mg/dL — ABNORMAL HIGH (ref 8–23)
CO2: 25 mmol/L (ref 22–32)
Calcium: 9.1 mg/dL (ref 8.9–10.3)
Chloride: 105 mmol/L (ref 98–111)
Creatinine, Ser: 0.93 mg/dL (ref 0.61–1.24)
GFR, Estimated: >60 mL/min (ref >60)
Glucose, Bld: 143 mg/dL — ABNORMAL HIGH (ref 70–99)
Potassium: 3.7 mmol/L (ref 3.5–5.1)
Sodium: 137 mmol/L (ref 135–145)
Total Bilirubin: 0.4 mg/dL (ref 0.3–1.2)
Total Protein: 6.7 g/dL (ref 6.5–8.1)

## 2020-10-01 LAB — CBC WITH DIFFERENTIAL/PLATELET
Abs Immature Granulocytes: 0.03 10*3/uL (ref 0.00–0.07)
Basophils Absolute: 0 10*3/uL (ref 0.0–0.1)
Basophils Relative: 1 %
Eosinophils Absolute: 0 10*3/uL (ref 0.0–0.5)
Eosinophils Relative: 1 %
HCT: 48.6 % (ref 39.0–52.0)
Hemoglobin: 16.5 g/dL (ref 13.0–17.0)
Immature Granulocytes: 1 %
Lymphocytes Relative: 54 %
Lymphs Abs: 1.4 10*3/uL (ref 0.7–4.0)
MCH: 31.1 pg (ref 26.0–34.0)
MCHC: 34 g/dL (ref 30.0–36.0)
MCV: 91.7 fL (ref 80.0–100.0)
Monocytes Absolute: 0.3 10*3/uL (ref 0.1–1.0)
Monocytes Relative: 11 %
Neutro Abs: 0.8 10*3/uL — ABNORMAL LOW (ref 1.7–7.7)
Neutrophils Relative %: 32 %
Platelets: 62 10*3/uL — ABNORMAL LOW (ref 150–400)
RBC: 5.3 MIL/uL (ref 4.22–5.81)
RDW: 14 % (ref 11.5–15.5)
Smear Review: DECREASED
WBC: 2.5 10*3/uL — ABNORMAL LOW (ref 4.0–10.5)
nRBC: 0 % (ref 0.0–0.2)

## 2020-10-01 LAB — TROPONIN I (HIGH SENSITIVITY)
Troponin I (High Sensitivity): 42 ng/L — ABNORMAL HIGH (ref <18)
Troponin I (High Sensitivity): 44 ng/L — ABNORMAL HIGH (ref <18)

## 2020-10-01 LAB — RESP PANEL BY RT-PCR (FLU A&B, COVID) ARPGX2
Influenza A by PCR: NEGATIVE
Influenza B by PCR: NEGATIVE
SARS Coronavirus 2 by RT PCR: POSITIVE — AB

## 2020-10-01 LAB — BRAIN NATRIURETIC PEPTIDE: B Natriuretic Peptide: 380.6 pg/mL — ABNORMAL HIGH (ref 0.0–100.0)

## 2020-10-01 MED ORDER — NIRMATRELVIR/RITONAVIR (PAXLOVID)TABLET: 3.0000 | ORAL_TABLET | Freq: Two times a day (BID) | ORAL | 0 refills | Status: AC

## 2020-10-01 NOTE — ED Notes (Signed)
Pt. Reports he has had cramping in his legs for the last 2 nights.  Pt. States he had some diarrhea earlier in the week but none now.  Pt. Reports he just saw his primary care Dr. For this same feeling and was told poss. COVID symptoms but no test done.

## 2020-10-01 NOTE — ED Notes (Signed)
Pacemaker interogatted, received stars lit up at end of interogation, waiting for report

## 2020-10-01 NOTE — ED Notes (Signed)
Updated patiens son Daniel [name] regarding patients status at this time.

## 2020-10-01 NOTE — ED Notes (Signed)
Vital signs stable. 

## 2020-10-01 NOTE — Discharge Instructions (Addendum)
You were seen in the emergency room today with weakness.  Your COVID test come back positive I am starting on antiviral medications.  Please continue to follow closely with your primary care doctor.  Return to the emergency department with any chest pain, trouble breathing, worsening symptoms.

## 2020-10-01 NOTE — ED Triage Notes (Signed)
Pt arrives pov with c/o bilateral leg weakness, and shob with ambulation and chest tightness. Pt has pacemaker

## 2020-10-01 NOTE — ED Provider Notes (Signed)
Emergency Department Provider Note   I have reviewed the triage vital signs and the nursing notes.   HISTORY  Chief Complaint Chest Pain   HPI Daniel [name]. is a 75 y.o. male with complicated past medical history reviewed below including CAD, COPD, CHF with AICD (Angelina) presents to the emergency department with generalized weakness, chest discomfort, shortness of breath symptoms.  Patient states that overall symptoms been present for the last 2 months or so.  He has been told to expect these sorts of symptoms from time to time and that he should just take a break when working if he feels fatigued.  Has been doing this but symptoms got abruptly worse on Sunday of this week.  He went to see his PCP and had blood work drawn has a follow-up appointment this coming week. No active CP or SOB but notes some occasionally. He was feeling better yesterday but today began to feel some returning weakness. No vomiting, abdominal pain, diarrhea. He did have some mild dysuria but took an OTC medication for UTI and is feeling better. No numbness, speech change, HA, or difficulty swallowing.    Past Medical History:  Diagnosis Date   Anxiety    Atrial fibrillation [facility])    Limited episode, emergency room, June, 2013, spontaneous conversion to sinus rhythm, was evaluated By [name] 2013- no further visits.  Post-op (CABG) a-fib, converted on amio but pt self d/c'd this med due to DOE/side effect profile.   CAD (coronary artery disease) 05/2016   a. 05/2016: NSTEMI with cath showing 3-vessel disease. Underwent CABG on 05/12/16 with LIMA-D1, SVG-LAD, and SVG-PDA.    COPD (chronic obstructive pulmonary disease) ([place]) fall 2016   Bullous changes noted on lower lung images of CT abd done by urology   Diverticulitis    Double vision 01/2020   MG ab w/u NEG.  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 Unable to have MRI due to having shrapnel in forehead.   Hyperlipidemia    statin intolerant. Pt declined advanced lipid clinic referral 08/2019 and 03/2020.   Hypertension    Ischemic cardiomyopathy 05/2016   EF 20-25% at the time of NSTEMI/CABG.  Repeat EF 07/2016 30-35%.  07/2017 EF 25-30%, pt intol of entresto, bidil, and BB--for ICD 03/2019.   Microscopic hematuria Fall 2016   CT nl except nonobstructing stones.  Cystoscopy normal 12/30/14 ([name])   Nephrolithiasis    11 mm stone on R, 2 mm stone on L, ureters clear   Non-STEMI (non-ST elevated myocardial infarction) ([place]) 05/2016   with impaired LV function   Prostatic adenocarcinoma ([place]) 02/2015   No evidence of metastatic dz on CT pelv 02/2015.  Urol ([name]) at [facility]; [name] referred him to [name] 03/2015: patient got bilat nerve sparing , robot assisted laparoscopic radical prostatectomy and pelvic lymphadenectomy.  Urol f/u, PSA undetectable on repeat 04/22/16 and 10/28/16; pt chose PSA surveill over adjuv rad tx. PSA undetectable as of 2021   Tobacco dependence    down to 1-2 cigs per day as of 11/2015.  Restarted 08/2016.   Urinary retention 05/2015   occurred s/p foley removal  Patient Active Problem List   Diagnosis Date Noted   Cerebral thrombosis with cerebral infarction 01/02/2020   Blurred vision 01/01/2020   ICD (implantable cardioverter-defibrillator) in place 07/16/2019   Coronary artery disease involving native coronary artery of native heart without angina pectoris [id number]   Chronic systolic HF (heart failure) ([place]) 09/30/2018   Dyslipidemia 09/30/2018   Tobacco abuse 09/30/2018   Hx of CABG 06/27/2018   Educated about COVID-19 virus infection 05/16/2018   Bradycardia 06/20/2017    Coronary artery disease involving coronary bypass graft of native heart without angina pectoris 06/07/2017   Tobacco abuse counseling 06/07/2017   Ischemic cardiomyopathy 10/13/2016   CAD (coronary artery disease) 05/12/2016   Non-ST elevation (NSTEMI) myocardial infarction [facility])    Prostate cancer ([place]) 04/30/2015   UTI (urinary tract infection) 11/14/2014   Hyperlipidemia LDL goal <70 10/30/2013   Acute prostatitis 06/19/2013   Uncontrolled hypertension 03/01/2013   Tobacco dependence 03/01/2013   Elevated PSA 03/01/2013   BPH (benign prostatic hypertrophy) with urinary obstruction 08/30/2012   GERD (gastroesophageal reflux disease) 08/30/2012   HTN (hypertension), benign 08/30/2012   Encounter to establish care with new doctor 08/30/2012   Tinnitus of both ears 08/30/2012   Health maintenance examination 08/30/2012   Palpable abdominal aorta 08/30/2012   Postoperative atrial fibrillation (HCC)    Ejection fraction    Hypertension    Anxiety     Past Surgical History:  Procedure Laterality Date   aortic ultrasound  07/2012   NO AAA   COLONOSCOPY  approx 2011 per pt   Done by surgeon in [place] after a bout of diverticulitis; normal per pt report--was told to repeat in 10 yrs.   CORONARY ARTERY BYPASS GRAFT N/A 05/12/2016   Procedure: CORONARY ARTERY BYPASS GRAFTING (CABG) TIMES THREE USING LEFT INTERNAL MAMMARY ARTERY AND RIGHT SAPHENOUS LEG VEIN HARVESTED ENDOSCOPICALLY;  Surgeon: [name], MD;  Location: [place];  Service: Open Heart Surgery;  Laterality: N/A;   ICD IMPLANT N/A 04/12/2019   Procedure: ICD IMPLANT;  Surgeon: Evans Lance, MD;  Location: [place] CV LAB;  Service: Cardiovascular;  Laterality: N/A;   INGUINAL HERNIA REPAIR  2003 and 2004   Both sides.   LAPAROSCOPY  2017   LEFT HEART CATH AND CORONARY ANGIOGRAPHY N/A 05/09/2016   Procedure: Left Heart Cath and Coronary Angiography;  Surgeon: [name], MD;  Location: [place] CV LAB;   Service: Cardiovascular;  Laterality: N/A;   LITHOTRIPSY  2004   Dr. Maryland Pink in [place].   LYMPHADENECTOMY Bilateral 04/30/2015   Procedure: PELVIC LYMPHADENECTOMY;  Surgeon: [name], MD;  [facility];  Service: Urology;  Laterality: Bilateral;   PROSTATE BIOPSY  02/05/15   Prostate adenocarcinoma: pt considering treatment options as of 02/19/15   ROBOT ASSISTED LAPAROSCOPIC RADICAL PROSTATECTOMY N/A 04/30/2015   Procedure: XI ROBOTIC ASSISTED LAPAROSCOPIC RADICAL PROSTATECTOMY LEVEL 2;  Surgeon: [name], MD;  [facility];  Service: Urology;  Laterality: N/A;   TEE WITHOUT CARDIOVERSION N/A 05/12/2016   Procedure: TRANSESOPHAGEAL ECHOCARDIOGRAM (TEE);  Surgeon: [name], MD;  Location: [place];  Service: Open Heart Surgery;  Laterality: N/A;   TRANSTHORACIC ECHOCARDIOGRAM  03/22/2006; 05/2016; 07/2016; 07/2017; 01/02/20   2008: EF 65%, normal valves, no wall motion abnormalties, LA size normal.  05/2016 in context of non-STEMI, EF 20-25%, mild AV and MV regurg.  08/05/16: EF 30-35%, akinesis of mid lateral myoc, grd II DD, mild aortic 07/2017: EF 25-30%, mult areas of akinesis, grd I  DD. 01/2020 EF 25-30%, sev LV dsfxn, valves fine, aortic root 36m    Allergies Entresto [sacubitril-valsartan], Amlodipine, Contrast media [iodinated diagnostic agents], and Hydralazine  Family History  Problem Relation Age of Onset   Hypertension Mother    Cancer - Other Mother 746      breast   Cancer Father        Lung ca age 75  Diabetes Sister     Social History Social History   Tobacco Use   Smoking status: Some Days    Packs/day: 1.00    Years: 55.00    Pack years: 55.00    Types: Cigarettes   Smokeless tobacco: Never   Tobacco comments:    quit in 2017  Vaping Use   Vaping Use: Never used  Substance Use Topics   Alcohol use: No   Drug use: No    Review of Systems  Constitutional: No fever/chills. Generalized weakness x 2-3 months.  Eyes: No visual changes. ENT:  No sore throat. Cardiovascular: Intermittent chest pain. Respiratory: Mild shortness of breath. Gastrointestinal: No abdominal pain.  No nausea, no vomiting.  No diarrhea.  No constipation. Genitourinary: Mild dysuria (resolved) Musculoskeletal: Negative for back pain. Skin: Negative for rash. Neurological: Negative for headaches, focal weakness or numbness.  10-point ROS otherwise negative.  ____________________________________________   PHYSICAL EXAM:  VITAL SIGNS: ED Triage Vitals  Enc Vitals Group     BP 10/01/20 1639 (!) 198/105     Pulse Rate 10/01/20 1639 76     Resp 10/01/20 1639 20     Temp 10/01/20 1639 98.8 F (37.1 C)     Temp Source 10/01/20 1639 Oral     SpO2 10/01/20 1639 99 %     Weight 10/01/20 1641 156 lb (70.8 kg)     Height 10/01/20 1641 '5\' 10"'$  (1.778 m)    Constitutional: Alert and oriented. Well appearing and in no acute distress. Eyes: Conjunctivae are normal.  Head: Atraumatic. Nose: No congestion/rhinnorhea. Mouth/Throat: Mucous membranes are moist.   Neck: No stridor.  Cardiovascular: Normal rate, regular rhythm. Good peripheral circulation. Grossly normal heart sounds.   Respiratory: Normal respiratory effort.  No retractions. Lungs CTAB. Gastrointestinal: Soft and nontender. No distention.  Musculoskeletal: No lower extremity tenderness nor edema. No gross deformities of extremities. Neurologic:  Normal speech and language. No gross focal neurologic deficits are appreciated. No facial asymmetry. 5/5 strength in the bilateral upper/power extremities.  Skin:  Skin is warm, dry and intact. No rash noted.   ____________________________________________   LABS (all labs ordered are listed, but only abnormal results are displayed)  Labs Reviewed  RESP PANEL BY RT-PCR (FLU A&B, COVID) ARPGX2 - Abnormal; Notable for the following components:      Result Value   SARS Coronavirus 2 by RT PCR POSITIVE (*)    All other components within normal  limits  COMPREHENSIVE METABOLIC PANEL - Abnormal; Notable for the following components:   Glucose, Bld 143 (*)    BUN 24 (*)    All other components within normal limits  BRAIN NATRIURETIC PEPTIDE - Abnormal; Notable for the following components:   B Natriuretic Peptide 380.6 (*)    All other components within normal limits  CBC WITH DIFFERENTIAL/PLATELET - Abnormal; Notable for the following components:   WBC 2.5 (*)    Platelets 62 (*)    Neutro Abs 0.8 (*)    All other components within normal limits  TROPONIN I (HIGH SENSITIVITY) - Abnormal; Notable for the  following components:   Troponin I (High Sensitivity) 42 (*)    All other components within normal limits  TROPONIN I (HIGH SENSITIVITY) - Abnormal; Notable for the following components:   Troponin I (High Sensitivity) 44 (*)    All other components within normal limits   ____________________________________________  EKG   EKG Interpretation  Date/Time:  Thursday October 01 2020 16:41:39 EDT Ventricular Rate:  73 PR Interval:  209 QRS Duration: 138 QT Interval:  417 QTC Calculation: 460 R Axis:   93 Text Interpretation: Sinus rhythm Consider left ventricular hypertrophy Nonspecific T abnormalities, diffuse leads Baseline wander in lead(s) V3 Similar to Dec 2021 tracing Confirmed by Nanda Quinton [phone]) on 10/01/2020 4:46:17 PM        ____________________________________________  RADIOLOGY  DG Chest Portable 1 View  Result Date: 10/01/2020 CLINICAL DATA:  Chest pain EXAM: PORTABLE CHEST 1 VIEW COMPARISON:  04/12/2019 FINDINGS: Left-sided implanted cardiac device in stable positioning. Post CABG changes. Stable cardiomediastinal contours. No focal airspace consolidation, pleural effusion, or pneumothorax. IMPRESSION: No active disease. Electronically Signed   By: Davina Poke D.O.   On: 10/01/2020 17:43    ____________________________________________   PROCEDURES  Procedure(s) performed:    Procedures  None ____________________________________________   INITIAL IMPRESSION / ASSESSMENT AND PLAN / ED COURSE  Pertinent labs & imaging results that were available during my care of the patient were reviewed by me and considered in my medical decision making (see chart for details).   Patient presents to the emergency department with generalized weakness worsening over the past 7 days.  Some occasional chest pain and shortness of breath but nothing persistent or reproducible.  No fevers or chills.  Also describing some mild dysuria but resolved with over-the-counter treatment.  Plan for chest x-ray along with COVID swab, labs including troponin, BNP, interrogation of the patient's [facility] pacemaker.  He does have elevated blood pressure here but no specific findings to suggest acute hypertensive emergency at this time.  Patient's labs coming back positive for COVID. CXR without PNA. No hypoxemia. Mild BNP elevation. No significant events on pacemaker interrogation. Troponin flat. Patient is a good candidate for Paxlovid. Discussed risk/benefits of meds and sent to pharmacy. Patient has follow up scheduled with PCP for thrombocytopenia. Discussed quarantine and ED return precautions.   [name]. was evaluated in Emergency Department on 10/01/2020 for the symptoms described in the history of present illness. He was evaluated in the context of the global COVID-19 pandemic, which necessitated consideration that the patient might be at risk for infection with the SARS-CoV-2 virus that causes COVID-19. Institutional protocols and algorithms that pertain to the evaluation of patients at risk for COVID-19 are in a state of rapid change based on information released by regulatory bodies including the CDC and federal and state organizations. These policies and algorithms were followed during the patient's care in the ED.  ____________________________________________  FINAL CLINICAL  IMPRESSION(S) / ED DIAGNOSES  Final diagnoses:  COVID-19  Weakness     NEW OUTPATIENT MEDICATIONS STARTED DURING THIS VISIT:  Discharge Medication List as of 10/01/2020  7:48 PM     START taking these medications   Details  nirmatrelvir/ritonavir EUA (PAXLOVID) 20 x 150 MG & 10 x '100MG'$  TABS Take 3 tablets by mouth 2 (two) times daily for 5 days. Patient GFR is 60. Take nirmatrelvir (150 mg) two tablets twice daily for 5 days and ritonavir (100 mg) one tablet twice daily for 5 days., Starting Thu 10/01/2020, Until  Tue 10/06/2020, Normal        Note:  This document was prepared using Dragon voice recognition software and may include unintentional dictation errors.  Nanda Quinton, MD, [facility] Emergency Medicine    Shamonique Battiste, [facility], MD 10/02/20 [phone]

## 2020-10-06 ENCOUNTER — Encounter: Payer: Self-pay | Admitting: Family Medicine

## 2020-10-06 ENCOUNTER — Ambulatory Visit (INDEPENDENT_AMBULATORY_CARE_PROVIDER_SITE_OTHER): Payer: Medicare Other | Admitting: Family Medicine

## 2020-10-06 VITALS — BP 136/78 | Temp 98.0°F

## 2020-10-06 DIAGNOSIS — U071 COVID-19: Secondary | ICD-10-CM | POA: Diagnosis not present

## 2020-10-06 DIAGNOSIS — G2581 Restless legs syndrome: Secondary | ICD-10-CM | POA: Diagnosis not present

## 2020-10-06 DIAGNOSIS — R110 Nausea: Secondary | ICD-10-CM | POA: Diagnosis not present

## 2020-10-06 DIAGNOSIS — D696 Thrombocytopenia, unspecified: Secondary | ICD-10-CM

## 2020-10-06 DIAGNOSIS — R5383 Other fatigue: Secondary | ICD-10-CM | POA: Diagnosis not present

## 2020-10-06 MED ORDER — ALPRAZOLAM 0.25 MG PO TABS: ORAL_TABLET | ORAL | 5 refills | Status: DC

## 2020-10-06 MED ORDER — ONDANSETRON HCL 4 MG PO TABS: 4.0000 mg | ORAL_TABLET | Freq: Three times a day (TID) | ORAL | 1 refills | Status: DC | PRN

## 2020-10-06 NOTE — Progress Notes (Addendum)
OFFICE VISIT  10/06/2020  CC:  Chief Complaint  Patient presents with   Covid Positive    Pt c/o stomach feeling weak and no appetite. covid + 10/01/2020   I connected with Daniel [name] today by telephone.  He does not have access to any device that would allow for video enabled telemedicine application.  I verified that I am speaking with the correct person using two identifiers.  Location patient: home, [place] Location provider:work or home office Persons participating in the virtual visit: patient, provider  I discussed the limitations of evaluation and management by telemedicine and the availability of in person appointments. The patient expressed understanding and agreed to proceed.  HPI:    Patient is a 75 y.o. Caucasian male who presents for 1 wk f/u fatigue---telephone encounter d/t pt recent positive covid test.  Pt does not have a cell phone or computer so he's unable to do video visit.  He ended up going to the ED 10/01/20 for ongoing progressive fatigue, chest tightness, SOB. Covid +, paxlovid started.  Platelets had dropped from 73K to 62K, otherwise CBC normal.  CMET normal.  Troponins mildly elevated but flat.  BNP mild elev at 380. CXR NO ACUTE DZ.  CURRENTLY: He finished paxlovid but says it made him nauseated and caused HA. HA's resolved. Taking some tylenol. Still feeling weak stomach, some nausea but no vomiting. No signif cough or uri sx's, no fever, no CP or SOB. His generalized weakness has improved.    Additionally: Last few weeks feels inc need to move legs around, restless legs---at night only.  Getting up and walking around helps but when get gets back in bed it returns. Has alprazolam and took 1/2 of 0.'25mg'$  tab and says it did help some with sx's.  Past Medical History:  Diagnosis Date   Anxiety    Atrial fibrillation [facility])    Limited episode, emergency room, June, 2013, spontaneous conversion to sinus rhythm, was evaluated By [name] 2013- no further visits.   Post-op (CABG) a-fib, converted on amio but pt self d/c'd this med due to DOE/side effect profile.   CAD (coronary artery disease) 05/2016   a. 05/2016: NSTEMI with cath showing 3-vessel disease. Underwent CABG on 05/12/16 with LIMA-D1, SVG-LAD, and SVG-PDA.    COPD (chronic obstructive pulmonary disease) ([place]) fall 2016   Bullous changes noted on lower lung images of CT abd done by urology   Diverticulitis    Double vision 01/2020   MG ab w/u NEG.  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 Unable to have MRI due to having shrapnel in forehead.   Hyperlipidemia    statin intolerant. Pt declined advanced lipid clinic referral 08/2019 and 03/2020.   Hypertension    Ischemic cardiomyopathy 05/2016   EF 20-25% at the time of NSTEMI/CABG.  Repeat EF 07/2016 30-35%.  07/2017 EF 25-30%, pt intol of entresto, bidil, and BB--for ICD 03/2019.   Microscopic hematuria Fall 2016   CT nl except nonobstructing stones.  Cystoscopy normal 12/30/14 ([name])   Nephrolithiasis    11 mm stone on R, 2 mm stone on L, ureters clear   Non-STEMI (non-ST elevated myocardial infarction) ([place]) 05/2016  with impaired LV function   Prostatic adenocarcinoma ([place]) 02/2015   No evidence of metastatic dz on CT pelv 02/2015.  Urol ([name]) at [facility]; [name] referred him to [name] 03/2015: patient got bilat nerve sparing , robot assisted laparoscopic radical prostatectomy and pelvic lymphadenectomy.  Urol f/u, PSA undetectable on repeat 04/22/16 and 10/28/16; pt chose PSA surveill over adjuv rad tx. PSA undetectable as of 2021   Tobacco dependence    down to 1-2 cigs per day as of 11/2015.  Restarted  08/2016.   Urinary retention 05/2015   occurred s/p foley removal    Past Surgical History:  Procedure Laterality Date   aortic ultrasound  07/2012   NO AAA   COLONOSCOPY  approx 2011 per pt   Done by surgeon in [place] after a bout of diverticulitis; normal per pt report--was told to repeat in 10 yrs.   CORONARY ARTERY BYPASS GRAFT N/A 05/12/2016   Procedure: CORONARY ARTERY BYPASS GRAFTING (CABG) TIMES THREE USING LEFT INTERNAL MAMMARY ARTERY AND RIGHT SAPHENOUS LEG VEIN HARVESTED ENDOSCOPICALLY;  Surgeon: [name], MD;  Location: [place];  Service: Open Heart Surgery;  Laterality: N/A;   ICD IMPLANT N/A 04/12/2019   Procedure: ICD IMPLANT;  Surgeon: Evans Lance, MD;  Location: [place] CV LAB;  Service: Cardiovascular;  Laterality: N/A;   INGUINAL HERNIA REPAIR  2003 and 2004   Both sides.   LAPAROSCOPY  2017   LEFT HEART CATH AND CORONARY ANGIOGRAPHY N/A 05/09/2016   Procedure: Left Heart Cath and Coronary Angiography;  Surgeon: [name], MD;  Location: [place] [facility] LAB;  Service: Cardiovascular;  Laterality: N/A;   LITHOTRIPSY  2004   Dr. Maryland Pink in [place].   LYMPHADENECTOMY Bilateral 04/30/2015   Procedure: PELVIC LYMPHADENECTOMY;  Surgeon: [name], MD;  [facility];  Service: Urology;  Laterality: Bilateral;   PROSTATE BIOPSY  02/05/15   Prostate adenocarcinoma: pt considering treatment options as of 02/19/15   ROBOT ASSISTED LAPAROSCOPIC RADICAL PROSTATECTOMY N/A 04/30/2015   Procedure: XI ROBOTIC ASSISTED LAPAROSCOPIC RADICAL PROSTATECTOMY LEVEL 2;  Surgeon: [name], MD;  [facility];  Service: Urology;  Laterality: N/A;   TEE WITHOUT CARDIOVERSION N/A 05/12/2016   Procedure: TRANSESOPHAGEAL ECHOCARDIOGRAM (TEE);  Surgeon: [name], MD;  Location: [place];  Service: Open Heart Surgery;  Laterality: N/A;   TRANSTHORACIC ECHOCARDIOGRAM  03/22/2006; 05/2016; 07/2016; 07/2017; 01/02/20   2008: EF 65%, normal valves, no wall motion abnormalties,  LA size normal.  05/2016 in context of non-STEMI, EF 20-25%, mild AV and MV regurg.  08/05/16: EF 30-35%, akinesis of mid lateral myoc, grd II DD, mild aortic 07/2017: EF 25-30%, mult areas of akinesis, grd I DD. 01/2020 EF 25-30%, sev LV dsfxn, valves fine, aortic root 67m    Outpatient Medications Prior to Visit  Medication Sig Dispense Refill   aspirin EC 81 MG tablet Take 1 tablet (81 mg total) by mouth daily. Swallow whole. 90 tablet 3   benazepril (LOTENSIN) 20 MG tablet Take 1 tablet (20 mg total) by mouth daily. 1 tab po bid 180 tablet 3   nirmatrelvir/ritonavir EUA (PAXLOVID) 20 x 150 MG & 10 x '100MG'$  TABS Take 3 tablets by mouth 2 (two) times daily for 5 days. Patient GFR is 60. Take nirmatrelvir (150 mg) two tablets twice daily for 5 days and ritonavir (100 mg) one tablet twice daily for 5 days. 30 tablet 0   Omega-3 Fatty Acids (FISH OIL) 1000 MG CAPS  Take 1,000 mg by mouth daily.      Polyethyl Glycol-Propyl Glycol (LUBRICANT EYE DROPS) 0.4-0.3 % SOLN Place 1 drop into both eyes 3 (three) times daily as needed (dry/irritated eyes.).     Vitamin D, Cholecalciferol, 400 units CAPS Take 400 Units by mouth daily.      ALPRAZolam (XANAX) 0.25 MG tablet TAKE ONE TABLET BY MOUTH AT BEDTIME AS NEEDED 30 tablet 5   No facility-administered medications prior to visit.    Allergies  Allergen Reactions   Entresto [Sacubitril-Valsartan] Hives and Shortness Of Breath   Amlodipine Swelling    LE swelling   Contrast Media [Iodinated Diagnostic Agents] Other (See Comments)    Prostate problem had to wear a catheter for two weeks after having dye.    Hydralazine Palpitations and Other (See Comments)    Fatigue+    ROS As per HPI  PE: Vitals with BMI 10/06/2020 10/01/2020 10/01/2020  Height - - -  Weight - - -  BMI - - -  Systolic [id number]  Diastolic 78 95 90  Pulse - 73 73  Alert, oriented, sounds well, displays lucid thought and speech. No exam b/c audio visit only.  LABS:  Lab Results   Component Value Date   WBC 2.5 (L) 10/01/2020   HGB 16.5 10/01/2020   HCT 48.6 10/01/2020   MCV 91.7 10/01/2020   PLT 62 (L) 10/01/2020     Chemistry      Component Value Date/Time   NA 137 10/01/2020 1640   NA 139 02/26/2019 1026   K 3.7 10/01/2020 1640   CL 105 10/01/2020 1640   CO2 25 10/01/2020 1640   BUN 24 (H) 10/01/2020 1640   BUN 15 02/26/2019 1026   CREATININE 0.93 10/01/2020 1640   CREATININE 0.95 06/20/2017 1436      Component Value Date/Time   CALCIUM 9.1 10/01/2020 1640   ALKPHOS 65 10/01/2020 1640   AST 38 10/01/2020 1640   ALT 20 10/01/2020 1640   BILITOT 0.4 10/01/2020 1640   BILITOT 0.8 09/16/2016 0858     IMPRESSION AND PLAN:  Telephone visit:  1) Covid+ illness, primarily fatigue symptoms, some nausea but could be d/t paxlovid. He finished paxlovid yesterday.  Fatigue improved. Will rx zofran '4mg'$ , 1-2 tid prn.  2) RLS; tendency towards mild sx's in the past, now 2 wks signif worse. He has used his alpraz for this but only 1/2 of 0.'25mg'$  tab.  New rx for alpraz 0.25, take 1-2 qhs prn insomnia/RLS. Hb lately has been normal x 2.  3) Thrombocytopenia: chronic and mild but decreased significantly more lately (73K 09/29/20, 62K on 10/01/20). No signs/symptoms of bleeding. Recheck cbc at f/u in 10d.  Spent 20 min with pt today reviewing HPI, reviewing relevant past history, doing exam, reviewing and discussing lab and imaging data, and formulating plans.  An After Visit Summary was printed and given to the patient.  FOLLOW UP: Return for keep appt already scheduled for 07/16/20.  Signed:  Crissie Sickles, MD           10/06/2020

## 2020-10-09 ENCOUNTER — Ambulatory Visit (INDEPENDENT_AMBULATORY_CARE_PROVIDER_SITE_OTHER): Payer: Medicare Other

## 2020-10-09 DIAGNOSIS — I255 Ischemic cardiomyopathy: Secondary | ICD-10-CM | POA: Diagnosis not present

## 2020-10-09 LAB — CUP PACEART REMOTE DEVICE CHECK
Battery Remaining Longevity: 63 mo
Battery Remaining Percentage: 78 %
Battery Voltage: 2.98 V
Brady Statistic AP VP Percent: 30 %
Brady Statistic AP VS Percent: 62 %
Brady Statistic AS VP Percent: 1 %
Brady Statistic AS VS Percent: 7.5 %
Brady Statistic RA Percent Paced: 91 %
Brady Statistic RV Percent Paced: 30 %
Date Time Interrogation Session: 20220909020049
HighPow Impedance: 66 Ohm
Implantable Lead Implant Date: 20210312
Implantable Lead Implant Date: 20210312
Implantable Lead Location: 753859
Implantable Lead Location: 753860
Implantable Pulse Generator Implant Date: 20210312
Lead Channel Impedance Value: 400 Ohm
Lead Channel Impedance Value: 430 Ohm
Lead Channel Pacing Threshold Amplitude: 0.5 V
Lead Channel Pacing Threshold Amplitude: 1 V
Lead Channel Pacing Threshold Pulse Width: 0.5 ms
Lead Channel Pacing Threshold Pulse Width: 0.5 ms
Lead Channel Sensing Intrinsic Amplitude: 12 mV
Lead Channel Sensing Intrinsic Amplitude: 2.8 mV
Lead Channel Setting Pacing Amplitude: 1.25 V
Lead Channel Setting Pacing Amplitude: 5 V
Lead Channel Setting Pacing Pulse Width: 0.5 ms
Lead Channel Setting Sensing Sensitivity: 0.5 mV
Pulse Gen Serial Number: 111016652

## 2020-10-14 ENCOUNTER — Ambulatory Visit: Payer: Medicare Other | Admitting: Cardiology

## 2020-10-15 NOTE — Progress Notes (Signed)
Remote ICD transmission.   

## 2020-10-16 ENCOUNTER — Ambulatory Visit: Payer: Medicare Other | Admitting: Family Medicine

## 2020-10-20 NOTE — Progress Notes (Signed)
Cardiology Office Note   Date:  10/21/2020   ID:  Daniel Hurta., DOB [birth year], MRN [id number]  PCP:  [name], MD  Cardiologist:   [name], MD   Chief Complaint  Patient presents with   Coronary Artery Disease       History of Present Illness: Daniel Kaley. is a 75 y.o. male who presents for follow up of CAD s/p CABG LIMA to D1, SVG to LAD, SVG to PDA on 05/12/2016, HLD intolerant to statin, HTN, ICM with baseline EF 30-35%, and h/o prostate CA. His last cardiac catheterization on 05/09/2016 showed EF 20-25%, akinesis of the distal inferoapical segment with significant hypokinesis globally consistent with ischemic cardiomyopathy, multivessel disease with 70% followed by total occlusion of large LAD with distal collateral arteries, 50% proximal left circumflex stenosis with total occlusion of distal left circumflex, total occlusion of mid RCA with distal collateral artery. Bypass surgery was recommended and he eventually underwent successful CABG 3 by Dr. Roxan Hockey on 05/12/2016 with LIMA to diagonal, SVG to LAD, SVG to PDA. He developed postoperative atrial fibrillation and was treated with IV amiodarone with conversion to sinus rhythm prior to discharge. He had a follow-up echocardiogram on 08/05/2016 which revealed LV EF remains low at 30-35% however slightly improved from his previous 20-25%.  He was placed on carvedilol and Entresto, spironolactone was discontinued due to elevated potassium level. Unfortunately, he went back to the hospital on 11/20/2016 with shortness breath, diffuse rash and presyncope. He was given prednisone for his rash. He stopped the [facility] and restarted ACE inhibitor as he was sure the rash was related to the former.  Last EF in July was 25 - 30%.   He could not take the BiDil.  His blood pressure dropped too low.  He had a low heart rate and we stopped Coreg.  He also stop spironolactone as he thought it was making his heart skip . He now  status post ICD.  He returns for follow up.  Since I last saw him he was in the emergency room for COVID earlier this month.  He has had swimming slow recovery from this.  But he is still being active.  The patient denies any new symptoms such as chest discomfort, neck or arm discomfort. There has been no new shortness of breath, PND or orthopnea. There have been no reported palpitations, presyncope or syncope.   Past Medical History:  Diagnosis Date   Anxiety    Atrial fibrillation [facility])    Limited episode, emergency room, June, 2013, spontaneous conversion to sinus rhythm, was evaluated By [name] 2013- no further visits.  Post-op (CABG) a-fib, converted on amio but pt self d/c'd this med due to DOE/side effect profile.   CAD (coronary artery disease) 05/2016   a. 05/2016: NSTEMI with cath showing 3-vessel disease. Underwent CABG on 05/12/16 with LIMA-D1, SVG-LAD, and SVG-PDA.    COPD (chronic obstructive pulmonary disease) ([place]) fall 2016   Bullous changes noted on lower lung images of CT abd done by urology   Diverticulitis    Double vision 01/2020   MG ab w/u NEG.  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 Unable to have MRI due to having shrapnel in forehead.   Hyperlipidemia    statin intolerant. Pt declined advanced lipid clinic referral 08/2019 and 03/2020.   Hypertension    Ischemic cardiomyopathy 05/2016   EF 20-25% at the time of NSTEMI/CABG.  Repeat EF 07/2016 30-35%.  07/2017 EF 25-30%, pt intol of entresto, bidil, and BB--for ICD 03/2019.   Microscopic hematuria Fall 2016   CT nl except nonobstructing stones.  Cystoscopy normal 12/30/14 ([name])   Nephrolithiasis     11 mm stone on R, 2 mm stone on L, ureters clear   Non-STEMI (non-ST elevated myocardial infarction) ([place]) 05/2016   with impaired LV function   Prostatic adenocarcinoma ([place]) 02/2015   No evidence of metastatic dz on CT pelv 02/2015.  Urol ([name]) at [facility]; [name] referred him to [name] 03/2015: patient got bilat nerve sparing , robot assisted laparoscopic radical prostatectomy and pelvic lymphadenectomy.  Urol f/u, PSA undetectable on repeat 04/22/16 and 10/28/16; pt chose PSA surveill over adjuv rad tx. PSA undetectable as of 2021   Tobacco dependence    down to 1-2 cigs per day as of 11/2015.  Restarted 08/2016.   Urinary retention 05/2015   occurred s/p foley removal    Past Surgical History:  Procedure Laterality Date   aortic ultrasound  07/2012   NO AAA   COLONOSCOPY  approx 2011 per pt   Done by surgeon in [place] after a bout of diverticulitis; normal per pt report--was told to repeat in 10 yrs.   CORONARY ARTERY BYPASS GRAFT N/A 05/12/2016   Procedure: CORONARY ARTERY BYPASS GRAFTING (CABG) TIMES THREE USING LEFT INTERNAL MAMMARY ARTERY AND RIGHT SAPHENOUS LEG VEIN HARVESTED ENDOSCOPICALLY;  Surgeon: [name], MD;  Location: [place];  Service: Open Heart Surgery;  Laterality: N/A;   ICD IMPLANT N/A 04/12/2019   Procedure: ICD IMPLANT;  Surgeon: Evans Lance, MD;  Location: [place] [facility];  Service: Cardiovascular;  Laterality: N/A;   INGUINAL HERNIA REPAIR  2003 and 2004   Both sides.   LAPAROSCOPY  2017   LEFT HEART CATH AND CORONARY ANGIOGRAPHY N/A 05/09/2016   Procedure: Left Heart Cath and Coronary Angiography;  Surgeon: [name], MD;  Location: [place] [facility];  Service: Cardiovascular;  Laterality: N/A;   LITHOTRIPSY  2004   Dr. Maryland Pink in [place].   LYMPHADENECTOMY Bilateral 04/30/2015   Procedure: PELVIC LYMPHADENECTOMY;  Surgeon: [name], MD;  [facility];  Service: Urology;  Laterality: Bilateral;   PROSTATE BIOPSY  02/05/15    Prostate adenocarcinoma: pt considering treatment options as of 02/19/15   ROBOT ASSISTED LAPAROSCOPIC RADICAL PROSTATECTOMY N/A 04/30/2015   Procedure: XI ROBOTIC ASSISTED LAPAROSCOPIC RADICAL PROSTATECTOMY LEVEL 2;  Surgeon: [name], MD;  [facility];  Service: Urology;  Laterality: N/A;   TEE WITHOUT CARDIOVERSION N/A 05/12/2016   Procedure: TRANSESOPHAGEAL ECHOCARDIOGRAM (TEE);  Surgeon: [name], MD;  Location: [place];  Service: Open Heart Surgery;  Laterality: N/A;   TRANSTHORACIC ECHOCARDIOGRAM  03/22/2006; 05/2016; 07/2016; 07/2017; 01/02/20   2008: EF 65%, normal valves, no wall motion abnormalties, LA size normal.  05/2016 in context of non-STEMI, EF 20-25%, mild AV and MV regurg.  08/05/16: EF 30-35%, akinesis of mid lateral myoc, grd II DD, mild aortic 07/2017: EF 25-30%, mult areas of akinesis, grd I DD. 01/2020 EF 25-30%, sev LV dsfxn, valves fine, aortic root 69mm     Current Outpatient Medications  Medication  Sig Dispense Refill   ALPRAZolam (XANAX) 0.25 MG tablet 1-2 tabs po qhs prn insomnia/restless legs 60 tablet 5   aspirin EC 81 MG tablet Take 1 tablet (81 mg total) by mouth daily. Swallow whole. 90 tablet 3   benazepril (LOTENSIN) 20 MG tablet Take 1 tablet (20 mg total) by mouth daily. 1 tab po bid 180 tablet 3   Omega-3 Fatty Acids (FISH OIL) 1000 MG CAPS Take 1,000 mg by mouth daily.      Polyethyl Glycol-Propyl Glycol (LUBRICANT EYE DROPS) 0.4-0.3 % SOLN Place 1 drop into both eyes 3 (three) times daily as needed (dry/irritated eyes.).     Vitamin D, Cholecalciferol, 400 units CAPS Take 400 Units by mouth daily.      ondansetron (ZOFRAN) 4 MG tablet Take 1 tablet (4 mg total) by mouth every 8 (eight) hours as needed for nausea or vomiting. (Patient not taking: Reported on 10/21/2020) 20 tablet 1   No current facility-administered medications for this visit.    Allergies:   Entresto [sacubitril-valsartan], Amlodipine, Contrast media [iodinated diagnostic  agents], and Hydralazine    ROS:  Please see the history of present illness.   Otherwise, review of systems are positive for none.   All other systems are reviewed and negative.    PHYSICAL EXAM: VS:  BP (!) 154/80   Pulse 78   Ht 5\' 11"  (1.803 m)   Wt 154 lb (69.9 kg)   BMI 21.48 kg/m  , BMI Body mass index is 21.48 kg/m.  GENERAL:  Well appearing NECK:  No jugular venous distention, waveform within normal limits, carotid upstroke brisk and symmetric, no bruits, no thyromegaly LUNGS:  Clear to auscultation bilaterally CHEST:  Unremarkable HEART:  PMI not displaced or sustained,S1 and S2 within normal limits, no S3, no S4, no clicks, no rubs, no murmurs ABD:  Flat, positive bowel sounds normal in frequency in pitch, no bruits, no rebound, no guarding, no midline pulsatile mass, no hepatomegaly, no splenomegaly EXT:  2 plus pulses throughout, no edema, no cyanosis no clubbing   EKG:  EKG is not done today   Recent Labs: 09/29/2020: TSH 0.48 10/01/2020: ALT 20; B Natriuretic Peptide 380.6; BUN 24; Creatinine, Ser 0.93; Hemoglobin 16.5; Platelets 62; Potassium 3.7; Sodium 137    Lipid Panel    Component Value Date/Time   CHOL 175 03/27/2020 1000   CHOL 137 09/16/2016 0858   TRIG 63.0 03/27/2020 1000   HDL 46.30 03/27/2020 1000   HDL 35 (L) 09/16/2016 0858   CHOLHDL 4 03/27/2020 1000   VLDL 12.6 03/27/2020 1000   LDLCALC 116 (H) 03/27/2020 1000   LDLCALC 88 09/16/2016 0858      Wt Readings from Last 3 Encounters:  10/21/20 154 lb (69.9 kg)  10/01/20 156 lb (70.8 kg)  09/29/20 154 lb 3.2 oz (69.9 kg)      Other studies Reviewed: Additional studies/ records that were reviewed today include: Labs Review of the above records demonstrates:  See elsewhere  ASSESSMENT AND PLAN:  CAD s/p CABG:    The patient has no new sypmtoms.  No further cardiovascular testing is indicated.  We will continue with aggressive risk reduction and meds as listed.  Ischemic cardiomyopathy:      He did not tolerate or want any med titration.  No change in therapy.   Hypertension: Blood pressure is mildly elevated but again he does not want more than the donezepil he is taking.  Hyperlipidemia:    LDL was 116.  Her, he did not.   ICD: He is up-to-date with follow-up   The following changes have been made: None  Labs/ tests ordered today include:  None No orders of the defined types were placed in this encounter.    Disposition:   FU with me in 12  months.   Signed, [name], MD  10/21/2020 4:41 PM    [facility]

## 2020-10-21 ENCOUNTER — Ambulatory Visit (INDEPENDENT_AMBULATORY_CARE_PROVIDER_SITE_OTHER): Payer: Medicare Other | Admitting: Cardiology

## 2020-10-21 ENCOUNTER — Encounter: Payer: Self-pay | Admitting: Cardiology

## 2020-10-21 ENCOUNTER — Other Ambulatory Visit: Payer: Self-pay

## 2020-10-21 VITALS — BP 154/80 | HR 78 | Ht 71.0 in | Wt 154.0 lb

## 2020-10-21 DIAGNOSIS — I10 Essential (primary) hypertension: Secondary | ICD-10-CM

## 2020-10-21 DIAGNOSIS — I2510 Atherosclerotic heart disease of native coronary artery without angina pectoris: Secondary | ICD-10-CM

## 2020-10-21 DIAGNOSIS — E785 Hyperlipidemia, unspecified: Secondary | ICD-10-CM

## 2020-10-21 DIAGNOSIS — I255 Ischemic cardiomyopathy: Secondary | ICD-10-CM | POA: Diagnosis not present

## 2020-10-21 NOTE — Patient Instructions (Signed)
Medication Instructions:  The current medical regimen is effective;  continue present plan and medications.  *If you need a refill on your cardiac medications before your next appointment, please call your pharmacy*  Follow-Up: At [facility], you and your health needs are our priority.  As part of our continuing mission to provide you with exceptional heart care, we have created designated Provider Care Teams.  These Care Teams include your primary Cardiologist (physician) and Advanced Practice Providers (APPs -  Physician Assistants and Nurse Practitioners) who all work together to provide you with the care you need, when you need it.  We recommend signing up for the patient portal called "MyChart".  Sign up information is provided on this After Visit Summary.  MyChart is used to connect with patients for Virtual Visits (Telemedicine).  Patients are able to view lab/test results, encounter notes, upcoming appointments, etc.  Non-urgent messages can be sent to your provider as well.   To learn more about what you can do with MyChart, go to [email or web address].    Your next appointment:   1 year(s)  The format for your next appointment:   In Person  Provider:   Wassim Hochrein, MD   Thank you for choosing [facility]!!    

## 2020-10-22 ENCOUNTER — Encounter: Payer: Self-pay | Admitting: Family Medicine

## 2020-10-22 ENCOUNTER — Ambulatory Visit (INDEPENDENT_AMBULATORY_CARE_PROVIDER_SITE_OTHER): Payer: Medicare Other | Admitting: Family Medicine

## 2020-10-22 VITALS — BP 154/84 | HR 71 | Temp 97.9°F | Ht 70.0 in | Wt 154.0 lb

## 2020-10-22 DIAGNOSIS — D696 Thrombocytopenia, unspecified: Secondary | ICD-10-CM | POA: Diagnosis not present

## 2020-10-22 DIAGNOSIS — D72819 Decreased white blood cell count, unspecified: Secondary | ICD-10-CM

## 2020-10-22 DIAGNOSIS — Z8616 Personal history of COVID-19: Secondary | ICD-10-CM | POA: Diagnosis not present

## 2020-10-22 DIAGNOSIS — G2581 Restless legs syndrome: Secondary | ICD-10-CM | POA: Diagnosis not present

## 2020-10-22 LAB — CBC WITH DIFFERENTIAL/PLATELET
Basophils Absolute: 0 10*3/uL (ref 0.0–0.1)
Basophils Relative: 0.7 % (ref 0.0–3.0)
Eosinophils Absolute: 0.1 10*3/uL (ref 0.0–0.7)
Eosinophils Relative: 2 % (ref 0.0–5.0)
HCT: 42 % (ref 39.0–52.0)
Hemoglobin: 14.1 g/dL (ref 13.0–17.0)
Lymphocytes Relative: 23.2 % (ref 12.0–46.0)
Lymphs Abs: 1.1 10*3/uL (ref 0.7–4.0)
MCHC: 33.7 g/dL (ref 30.0–36.0)
MCV: 93.6 fl (ref 78.0–100.0)
Monocytes Absolute: 0.4 10*3/uL (ref 0.1–1.0)
Monocytes Relative: 8.6 % (ref 3.0–12.0)
Neutro Abs: 3.1 10*3/uL (ref 1.4–7.7)
Neutrophils Relative %: 65.5 % (ref 43.0–77.0)
Platelets: 134 10*3/uL — ABNORMAL LOW (ref 150.0–400.0)
RBC: 4.48 Mil/uL (ref 4.22–5.81)
RDW: 14.4 % (ref 11.5–15.5)
WBC: 4.7 10*3/uL (ref 4.0–10.5)

## 2020-10-22 NOTE — Progress Notes (Signed)
OFFICE VISIT  10/22/2020  CC:  Chief Complaint  Patient presents with   Follow-up    ED,    HPI:    Patient is a 75 y.o. Caucasian male who presents for f/u covid infection, RLS, and thrombocytopenia. ED visit 10/01/20 in which he was dx'd with covid 19 infection. He presented to the ED on 10/01/20 for ongoing fatigue, some feeling of SOB, and some chest heaviness/pain. CXR normal, EKG neg acute, mild elev BNP, troponins flat, low WBCs, low platelets. Covid PCR positive->rx'd paxlovid. I did phone visit f/u on 10/06/20 and he was improved but RLS worse.  Encouraged him to take his alprazolam for this.  UPDATE: Doing much better, still just a little easy fatigue in legs but rests and it goes away. No SOB, no cough, no fevers.  Taste is normal. No more nausea.  Fluid intake good.  No nosebleeds, no excessive bruising, no melena or hematochezia or hematuria.  Sharin Grave helps his RLS and he's only using this prn RLS.     Past Medical History:  Diagnosis Date   Anxiety    Atrial fibrillation [facility])    Limited episode, emergency room, June, 2013, spontaneous conversion to sinus rhythm, was evaluated By [name] 2013- no further visits.  Post-op (CABG) a-fib, converted on amio but pt self d/c'd this med due to DOE/side effect profile.   CAD (coronary artery disease) 05/2016   a. 05/2016: NSTEMI with cath showing 3-vessel disease. Underwent CABG on 05/12/16 with LIMA-D1, SVG-LAD, and SVG-PDA.    COPD (chronic obstructive pulmonary disease) ([place]) fall 2016   Bullous changes noted on lower lung images of CT abd done by urology   Diverticulitis    Double vision 01/2020   MG ab w/u NEG.  +4th nerve palsy/mid brain CVA, small vessels dz->DAPT   GERD (gastroesophageal reflux disease)    Has received pneumococcal vaccination    History of CVA (cerebrovascular accident) without residual deficits 01/2020   L midbrain, with mild L 4th CN palsy-->small vessel dz->DAPT x 3 wks then ASA alone (pt's  compliance with ASA PRIOR to CVA was questionable). Unable to have MRI due to having shrapnel in forehead.   Hyperlipidemia    statin intolerant. Pt declined advanced lipid clinic referral 08/2019 and 03/2020.   Hypertension    Ischemic cardiomyopathy 05/2016   EF 20-25% at the time of NSTEMI/CABG.  Repeat EF 07/2016 30-35%.  07/2017 EF 25-30%, pt intol of entresto, bidil, and BB--for ICD 03/2019.   Microscopic hematuria Fall 2016   CT nl except nonobstructing stones.  Cystoscopy normal 12/30/14 ([name])   Nephrolithiasis    11 mm stone on R, 2 mm stone on L, ureters clear   Non-STEMI (non-ST elevated myocardial infarction) ([place]) 05/2016   with impaired LV function   Prostatic adenocarcinoma ([place]) 02/2015   No evidence of metastatic dz on CT pelv 02/2015.  Urol ([name]) at [facility]; [name] referred him to [name] 03/2015: patient got bilat nerve sparing , robot assisted laparoscopic radical prostatectomy and pelvic lymphadenectomy.  Urol f/u, PSA undetectable on repeat 04/22/16 and 10/28/16; pt chose PSA surveill over adjuv rad tx. PSA undetectable as of 2021   Tobacco dependence    down to 1-2 cigs per day as of 11/2015.  Restarted 08/2016.   Urinary retention 05/2015   occurred s/p foley removal    Past Surgical History:  Procedure Laterality Date   aortic ultrasound  07/2012   NO AAA   COLONOSCOPY  approx 2011 per pt   Done by surgeon in [place] after a bout of diverticulitis; normal per pt report--was told to repeat in 10 yrs.   CORONARY ARTERY BYPASS GRAFT N/A 05/12/2016   Procedure: CORONARY ARTERY BYPASS GRAFTING (CABG) TIMES THREE USING LEFT INTERNAL MAMMARY ARTERY AND RIGHT SAPHENOUS LEG VEIN HARVESTED ENDOSCOPICALLY;  Surgeon: [name], MD;  Location: [place];  Service: Open Heart Surgery;  Laterality: N/A;   ICD IMPLANT N/A 04/12/2019   Procedure: ICD IMPLANT;  Surgeon: Evans Lance, MD;  Location: [place] [facility];  Service: Cardiovascular;  Laterality: N/A;    INGUINAL HERNIA REPAIR  2003 and 2004   Both sides.   LAPAROSCOPY  2017   LEFT HEART CATH AND CORONARY ANGIOGRAPHY N/A 05/09/2016   Procedure: Left Heart Cath and Coronary Angiography;  Surgeon: [name], MD;  Location: [place] [facility];  Service: Cardiovascular;  Laterality: N/A;   LITHOTRIPSY  2004   Dr. Maryland Pink in [place].   LYMPHADENECTOMY Bilateral 04/30/2015   Procedure: PELVIC LYMPHADENECTOMY;  Surgeon: [name], MD;  [facility];  Service: Urology;  Laterality: Bilateral;   PROSTATE BIOPSY  02/05/15   Prostate adenocarcinoma: pt considering treatment options as of 02/19/15   ROBOT ASSISTED LAPAROSCOPIC RADICAL PROSTATECTOMY N/A 04/30/2015   Procedure: XI ROBOTIC ASSISTED LAPAROSCOPIC RADICAL PROSTATECTOMY LEVEL 2;  Surgeon: [name], MD;  [facility];  Service: Urology;  Laterality: N/A;   TEE WITHOUT CARDIOVERSION N/A 05/12/2016   Procedure: TRANSESOPHAGEAL ECHOCARDIOGRAM (TEE);  Surgeon: [name], MD;  Location: [place];  Service: Open Heart Surgery;  Laterality: N/A;   TRANSTHORACIC ECHOCARDIOGRAM  03/22/2006; 05/2016; 07/2016; 07/2017; 01/02/20   2008: EF 65%, normal valves, no wall motion abnormalties, LA size normal.  05/2016 in context of non-STEMI, EF 20-25%, mild AV and MV regurg.  08/05/16: EF 30-35%, akinesis of mid lateral myoc, grd II DD, mild aortic 07/2017: EF 25-30%, mult areas of akinesis, grd I DD. 01/2020 EF 25-30%, sev LV dsfxn, valves fine, aortic root 29mm    Outpatient Medications Prior to Visit  Medication Sig Dispense Refill   ALPRAZolam (XANAX) 0.25 MG tablet 1-2 tabs po qhs prn insomnia/restless legs 60 tablet 5   aspirin EC 81 MG tablet Take 1 tablet (81 mg total) by mouth daily. Swallow whole. 90 tablet 3   benazepril (LOTENSIN) 20 MG tablet Take 1 tablet (20 mg total) by mouth daily. 1 tab po bid 180 tablet 3   Omega-3 Fatty Acids (FISH OIL) 1000 MG CAPS Take 1,000 mg by mouth daily.      Polyethyl Glycol-Propyl Glycol (LUBRICANT EYE  DROPS) 0.4-0.3 % SOLN Place 1 drop into both eyes 3 (three) times daily as needed (dry/irritated eyes.).     Vitamin D, Cholecalciferol, 400 units CAPS Take 400 Units by mouth daily.      ondansetron (ZOFRAN) 4 MG tablet Take 1 tablet (4 mg total) by mouth every 8 (eight) hours as needed for nausea or vomiting. (Patient not taking: No sig reported) 20 tablet 1   No facility-administered medications prior to visit.    Allergies  Allergen Reactions   Entresto [Sacubitril-Valsartan] Hives and Shortness Of Breath   Amlodipine Swelling    LE swelling   Contrast Media [Iodinated Diagnostic Agents] Other (See Comments)    Prostate problem had to wear a catheter for two weeks after having dye.    Hydralazine Palpitations and Other (See Comments)    Fatigue+    ROS As per  HPI  PE: Vitals with BMI 10/22/2020 10/21/2020 10/06/2020  Height 5\' 10"  5\' 11"  -  Weight 154 lbs 154 lbs -  BMI 32.4 40.10 -  Systolic 272 536 644  Diastolic 84 80 78  Pulse 71 78 -    Gen: Alert, well appearing.  Patient is oriented to person, place, time, and situation. AFFECT: pleasant, lucid thought and speech. CV: RRR, no m/r/g.   LUNGS: CTA bilat, nonlabored resps, good aeration in all lung fields. EXT: no clubbing or cyanosis.  no edema.    LABS:    Chemistry      Component Value Date/Time   NA 137 10/01/2020 1640   NA 139 02/26/2019 1026   K 3.7 10/01/2020 1640   CL 105 10/01/2020 1640   CO2 25 10/01/2020 1640   BUN 24 (H) 10/01/2020 1640   BUN 15 02/26/2019 1026   CREATININE 0.93 10/01/2020 1640   CREATININE 0.95 06/20/2017 1436      Component Value Date/Time   CALCIUM 9.1 10/01/2020 1640   ALKPHOS 65 10/01/2020 1640   AST 38 10/01/2020 1640   ALT 20 10/01/2020 1640   BILITOT 0.4 10/01/2020 1640   BILITOT 0.8 09/16/2016 0858     Lab Results  Component Value Date   WBC 2.5 (L) 10/01/2020   HGB 16.5 10/01/2020   HCT 48.6 10/01/2020   MCV 91.7 10/01/2020   PLT 62 (L) 10/01/2020   Lab  Results  Component Value Date   TSH 0.48 09/29/2020   IMPRESSION AND PLAN:  1) Covid 19 infection: resolved, minimal residual at this time.  2) Leukopenia and thrombocytopenia, chronic (WBC chronic borderline low x 3-4 yrs, platelet chronic low normal to mild low x 3-4 yrs )--a little worse than baseline lately. Recheck cbc w/diff today and check pathologist smear review.  3) RLS: not much of a prob since last f/u visit. He responds well to prn use of alprazolam for this.  An After Visit Summary was printed and given to the patient.  FOLLOW UP: Return in about 3 months (around 01/21/2021) for annual CPE (fasting).  Signed:  Crissie Sickles, MD           10/22/2020

## 2020-10-23 LAB — PATHOLOGIST SMEAR REVIEW

## 2021-01-06 ENCOUNTER — Telehealth: Payer: Self-pay | Admitting: Family Medicine

## 2021-01-06 MED ORDER — BENAZEPRIL HCL 20 MG PO TABS: 20.0000 mg | ORAL_TABLET | Freq: Every day | ORAL | 3 refills | Status: DC

## 2021-01-06 NOTE — Telephone Encounter (Signed)
Ok benazepril eRx'd

## 2021-01-06 NOTE — Telephone Encounter (Signed)
LVM for pt regarding medication.   

## 2021-01-06 NOTE — Telephone Encounter (Signed)
Caller Name: Krista Call back phone #: [phone]  MEDICATION(S): benazepril (LOTENSIN) 20 MG tablet Pt has 3-4 days left  Has the patient contacted their pharmacy? Yes.  They noted no response  Preferred Pharmacy:  [place], [place] - [id number] [place]

## 2021-01-06 NOTE — Telephone Encounter (Signed)
RF request for benzapril LOV: 10/22/20 Next ov: 02/04/21 Last written: 01/03/20(180,3)  Please review and advise. Med pending

## 2021-01-08 ENCOUNTER — Ambulatory Visit (INDEPENDENT_AMBULATORY_CARE_PROVIDER_SITE_OTHER): Payer: Medicare Other

## 2021-01-08 DIAGNOSIS — I255 Ischemic cardiomyopathy: Secondary | ICD-10-CM | POA: Diagnosis not present

## 2021-01-08 LAB — CUP PACEART REMOTE DEVICE CHECK
Battery Remaining Longevity: 50 mo
Battery Remaining Percentage: 75 %
Battery Voltage: 2.96 V
Brady Statistic AP VP Percent: 31 %
Brady Statistic AP VS Percent: 60 %
Brady Statistic AS VP Percent: 1 %
Brady Statistic AS VS Percent: 7.9 %
Brady Statistic RA Percent Paced: 90 %
Brady Statistic RV Percent Paced: 31 %
Date Time Interrogation Session: 20221209010044
HighPow Impedance: 65 Ohm
Implantable Lead Implant Date: 20210312
Implantable Lead Implant Date: 20210312
Implantable Lead Location: 753859
Implantable Lead Location: 753860
Implantable Pulse Generator Implant Date: 20210312
Lead Channel Impedance Value: 410 Ohm
Lead Channel Impedance Value: 430 Ohm
Lead Channel Pacing Threshold Amplitude: 0.5 V
Lead Channel Pacing Threshold Amplitude: 1.125 V
Lead Channel Pacing Threshold Pulse Width: 0.5 ms
Lead Channel Pacing Threshold Pulse Width: 0.5 ms
Lead Channel Sensing Intrinsic Amplitude: 12 mV
Lead Channel Sensing Intrinsic Amplitude: 2.7 mV
Lead Channel Setting Pacing Amplitude: 1.375
Lead Channel Setting Pacing Amplitude: 5 V
Lead Channel Setting Pacing Pulse Width: 0.5 ms
Lead Channel Setting Sensing Sensitivity: 0.5 mV
Pulse Gen Serial Number: 111016652

## 2021-01-18 NOTE — Progress Notes (Signed)
Remote ICD transmission.   

## 2021-01-21 ENCOUNTER — Encounter: Payer: Medicare Other | Admitting: Family Medicine

## 2021-01-31 DIAGNOSIS — I714 Abdominal aortic aneurysm, without rupture, unspecified: Secondary | ICD-10-CM

## 2021-01-31 HISTORY — DX: Abdominal aortic aneurysm, without rupture, unspecified: I71.40

## 2021-02-04 ENCOUNTER — Encounter: Payer: Self-pay | Admitting: Family Medicine

## 2021-02-04 ENCOUNTER — Ambulatory Visit (INDEPENDENT_AMBULATORY_CARE_PROVIDER_SITE_OTHER): Payer: Medicare Other | Admitting: Family Medicine

## 2021-02-04 ENCOUNTER — Other Ambulatory Visit: Payer: Self-pay

## 2021-02-04 VITALS — BP 173/91 | HR 73 | Temp 97.4°F | Ht 69.88 in | Wt 154.6 lb

## 2021-02-04 DIAGNOSIS — E7800 Pure hypercholesterolemia, unspecified: Secondary | ICD-10-CM

## 2021-02-04 DIAGNOSIS — B078 Other viral warts: Secondary | ICD-10-CM

## 2021-02-04 DIAGNOSIS — Z8673 Personal history of transient ischemic attack (TIA), and cerebral infarction without residual deficits: Secondary | ICD-10-CM | POA: Diagnosis not present

## 2021-02-04 DIAGNOSIS — I10 Essential (primary) hypertension: Secondary | ICD-10-CM | POA: Diagnosis not present

## 2021-02-04 DIAGNOSIS — Z0001 Encounter for general adult medical examination with abnormal findings: Secondary | ICD-10-CM

## 2021-02-04 DIAGNOSIS — R0989 Other specified symptoms and signs involving the circulatory and respiratory systems: Secondary | ICD-10-CM | POA: Diagnosis not present

## 2021-02-04 DIAGNOSIS — Z8546 Personal history of malignant neoplasm of prostate: Secondary | ICD-10-CM

## 2021-02-04 DIAGNOSIS — L989 Disorder of the skin and subcutaneous tissue, unspecified: Secondary | ICD-10-CM

## 2021-02-04 DIAGNOSIS — Z0000 Encounter for general adult medical examination without abnormal findings: Secondary | ICD-10-CM

## 2021-02-04 LAB — COMPREHENSIVE METABOLIC PANEL
ALT: 10 U/L (ref 0–53)
AST: 18 U/L (ref 0–37)
Albumin: 4.2 g/dL (ref 3.5–5.2)
Alkaline Phosphatase: 74 U/L (ref 39–117)
BUN: 18 mg/dL (ref 6–23)
CO2: 28 mEq/L (ref 19–32)
Calcium: 9.6 mg/dL (ref 8.4–10.5)
Chloride: 103 mEq/L (ref 96–112)
Creatinine, Ser: 1 mg/dL (ref 0.40–1.50)
GFR: 73.49 mL/min (ref >60.00)
Glucose, Bld: 86 mg/dL (ref 70–99)
Potassium: 4.8 mEq/L (ref 3.5–5.1)
Sodium: 136 mEq/L (ref 135–145)
Total Bilirubin: 0.8 mg/dL (ref 0.2–1.2)
Total Protein: 6.4 g/dL (ref 6.0–8.3)

## 2021-02-04 LAB — LIPID PANEL
Cholesterol: 177 mg/dL (ref 0–200)
HDL: 44.5 mg/dL (ref >39.00)
LDL Cholesterol: 121 mg/dL — ABNORMAL HIGH (ref 0–99)
NonHDL: 132.16
Total CHOL/HDL Ratio: 4
Triglycerides: 58 mg/dL (ref 0.0–149.0)
VLDL: 11.6 mg/dL (ref 0.0–40.0)

## 2021-02-04 LAB — PSA, MEDICARE: PSA: 0.1 ng/ml (ref 0.10–4.00)

## 2021-02-04 MED ORDER — CLONIDINE HCL 0.1 MG PO TABS: ORAL_TABLET | ORAL | 0 refills | Status: DC

## 2021-02-04 NOTE — Progress Notes (Signed)
Office Note 02/04/2021  CC:  Chief Complaint  Patient presents with   Annual Exam    Pt is fasting     HPI:  Patient is a 76 y.o. male who is here for annual health maintenance exam and f/u HTN and HLD. I last saw him about 3 mo ago. A/P as of that visit: "1) Covid 19 infection: resolved, minimal residual at this time.   2) Leukopenia and thrombocytopenia, chronic (WBC chronic borderline low x 3-4 yrs, platelet chronic low normal to mild low x 3-4 yrs )--a little worse than baseline lately. Recheck cbc w/diff today and check pathologist smear review.   3) RLS: not much of a prob since last f/u visit. He responds well to prn use of alprazolam for this."  INTERIM HX: Feeling fine. Last week started noting morning bp elevated---170/90. Normal bp at night. No preceding change in diet.  Still working painting trucks. No dizziness, headaches, vision changes, chest pain, or shortness of breath.  Has a small dry papule on the left side of neck that he requests removal of today.  It is not painful or itchy, just bothers him when getting caught on his shirt regularly PMP AWARE reviewed today: most recent rx for alprazolam was filled 01/27/21, # 4, rx by me. No red flags.    Past Medical History:  Diagnosis Date   Anxiety    Atrial fibrillation [facility])    Limited episode, emergency room, June, 2013, spontaneous conversion to sinus rhythm, was evaluated By [name] 2013- no further visits.  Post-op (CABG) a-fib, converted on amio but pt self d/c'd this med due to DOE/side effect profile.   CAD (coronary artery disease) 05/2016   a. 05/2016: NSTEMI with cath showing 3-vessel disease. Underwent CABG on 05/12/16 with LIMA-D1, SVG-LAD, and SVG-PDA.    COPD (chronic obstructive pulmonary disease) ([place]) fall 2016   Bullous changes noted on lower lung images of CT abd done by urology   Diverticulitis    Double vision 01/2020   MG ab w/u NEG.  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 Unable to have MRI due to having shrapnel in forehead.   Hyperlipidemia    statin intolerant. Pt declined advanced lipid clinic referral 08/2019 and 03/2020.   Hypertension    Ischemic cardiomyopathy 05/2016   EF 20-25% at the time of NSTEMI/CABG.  Repeat EF 07/2016 30-35%.  07/2017 EF 25-30%, pt intol of entresto, bidil, and BB--for ICD 03/2019.   Microscopic hematuria Fall 2016   CT nl except nonobstructing stones.  Cystoscopy normal 12/30/14 (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  Urol ([name]) at [facility]; [name] referred him to [name] 03/2015: patient got bilat nerve sparing , robot assisted laparoscopic radical prostatectomy and pelvic lymphadenectomy.  Urol f/u, PSA undetectable on repeat 04/22/16 and 10/28/16; pt chose PSA surveill over adjuv rad tx. PSA undetectable as of 2021   RLS (restless  legs syndrome)    Tobacco dependence    down to 1-2 cigs per day as of 11/2015.  Restarted 08/2016.   Urinary retention 05/2015   occurred s/p foley removal    Past Surgical History:  Procedure Laterality Date   aortic ultrasound  07/2012   NO AAA   COLONOSCOPY  approx 2011 per pt   Done by surgeon in [place] after a bout of diverticulitis; normal per pt report--was told to repeat in 10 yrs.   CORONARY ARTERY BYPASS GRAFT N/A 05/12/2016   Procedure: CORONARY ARTERY BYPASS GRAFTING (CABG) TIMES THREE USING LEFT INTERNAL MAMMARY ARTERY AND RIGHT SAPHENOUS  LEG VEIN HARVESTED ENDOSCOPICALLY;  Surgeon: [name], MD;  Location: [place];  Service: Open Heart Surgery;  Laterality: N/A;   ICD IMPLANT N/A 04/12/2019   Procedure: ICD IMPLANT;  Surgeon: Evans Lance, MD;  Location: [place] [facility];  Service: Cardiovascular;  Laterality: N/A;   INGUINAL HERNIA REPAIR  2003 and 2004   Both sides.   LAPAROSCOPY  2017   LEFT HEART CATH AND CORONARY ANGIOGRAPHY N/A 05/09/2016   Procedure: Left Heart Cath and Coronary Angiography;  Surgeon: [name], MD;  Location: [place] [facility];  Service: Cardiovascular;  Laterality: N/A;   LITHOTRIPSY  2004   Dr. [place] Pink in [place].   LYMPHADENECTOMY Bilateral 04/30/2015   Procedure: PELVIC LYMPHADENECTOMY;  Surgeon: [name], MD;  Location: [facility];  Service: Urology;  Laterality: Bilateral;   PROSTATE BIOPSY  02/05/15   Prostate adenocarcinoma: pt considering treatment options as of 02/19/15   ROBOT ASSISTED LAPAROSCOPIC RADICAL PROSTATECTOMY N/A 04/30/2015   Procedure: XI ROBOTIC ASSISTED LAPAROSCOPIC RADICAL PROSTATECTOMY LEVEL 2;  Surgeon: [name], MD;  Location: [facility];  Service: Urology;  Laterality: N/A;   TEE WITHOUT CARDIOVERSION N/A 05/12/2016   Procedure: TRANSESOPHAGEAL ECHOCARDIOGRAM (TEE);  Surgeon: [name], MD;  Location: [place];  Service: Open Heart Surgery;  Laterality: N/A;   TRANSTHORACIC ECHOCARDIOGRAM  03/22/2006; 05/2016; 07/2016; 07/2017; 01/02/20   2008: EF 65%, normal valves, no wall motion abnormalties, LA size normal.  05/2016 in context of non-STEMI, EF 20-25%, mild AV and MV regurg.  08/05/16: EF 30-35%, akinesis of mid lateral myoc, grd II DD, mild aortic 07/2017: EF 25-30%, mult areas of akinesis, grd I DD. 01/2020 EF 25-30%, sev LV dsfxn, valves fine, aortic root 24mm    Family History  Problem Relation Age of Onset   Hypertension Mother    Cancer - Other Mother 48       breast   Cancer Father        Lung ca age 10   Diabetes Sister     Social History    Socioeconomic History   Marital status: Married    Spouse name: Not on file   Number of children: Not on file   Years of education: Not on file   Highest education level: Not on file  Occupational History   Not on file  Tobacco Use   Smoking status: Some Days    Packs/day: 1.00    Years: 55.00    Pack years: 55.00    Types: Cigarettes   Smokeless tobacco: Never   Tobacco comments:    quit in 2017  Vaping Use   Vaping Use: Never used  Substance and Sexual Activity   Alcohol use: No   Drug use: No   Sexual activity: Not on file  Other Topics Concern   Not on file  Social History  Narrative   Married, 2 grown children.   HS education.  Orig from [place], lives there now.   Occupation: maintenance.  Drives a tractor a lot, drives a pickup truck a lot, rides horses a lot.   Tobacco: 20 pack-yr hx (current as of 07/2012)   No drugs.   Alcohol: none   Social Determinants of Radio broadcast assistant Strain: Not on file  Food Insecurity: Not on file  Transportation Needs: Not on file  Physical Activity: Not on file  Stress: Not on file  Social Connections: Not on file  Intimate Partner Violence: Not on file    Outpatient Medications Prior to Visit  Medication Sig Dispense Refill   ALPRAZolam (XANAX) 0.25 MG tablet 1-2 tabs po qhs prn insomnia/restless legs 60 tablet 5   aspirin EC 81 MG tablet Take 1 tablet (81 mg total) by mouth daily. Swallow whole. 90 tablet 3   benazepril (LOTENSIN) 20 MG tablet Take 1 tablet (20 mg total) by mouth daily. 1 tab po bid 180 tablet 3   Omega-3 Fatty Acids (FISH OIL) 1000 MG CAPS Take 1,000 mg by mouth daily.      ondansetron (ZOFRAN) 4 MG tablet Take 1 tablet (4 mg total) by mouth every 8 (eight) hours as needed for nausea or vomiting. 20 tablet 1   Polyethyl Glycol-Propyl Glycol (LUBRICANT EYE DROPS) 0.4-0.3 % SOLN Place 1 drop into both eyes 3 (three) times daily as needed (dry/irritated eyes.).     Vitamin D, Cholecalciferol,  400 units CAPS Take 400 Units by mouth daily.      No facility-administered medications prior to visit.    Allergies  Allergen Reactions   Entresto [Sacubitril-Valsartan] Hives and Shortness Of Breath   Amlodipine Swelling    LE swelling   Contrast Media [Iodinated Contrast Media] Other (See Comments)    Prostate problem had to wear a catheter for two weeks after having dye.    Hydralazine Palpitations and Other (See Comments)    Fatigue+    ROS Review of Systems  Constitutional:  Negative for appetite change, chills, fatigue and fever.  HENT:  Negative for congestion, dental problem, ear pain and sore throat.   Eyes:  Negative for discharge, redness and visual disturbance.  Respiratory:  Negative for cough, chest tightness, shortness of breath and wheezing.   Cardiovascular:  Negative for chest pain, palpitations and leg swelling.  Gastrointestinal:  Negative for abdominal pain, blood in stool, diarrhea, nausea and vomiting.  Genitourinary:  Negative for difficulty urinating, dysuria, flank pain, frequency, hematuria and urgency.  Musculoskeletal:  Negative for arthralgias, back pain, joint swelling, myalgias and neck stiffness.  Skin:  Negative for pallor and rash.  Neurological:  Negative for dizziness, speech difficulty, weakness and headaches.  Hematological:  Negative for adenopathy. Does not bruise/bleed easily.  Psychiatric/Behavioral:  Negative for confusion and sleep disturbance. The patient is not nervous/anxious.    PE; Vitals with BMI 02/04/2021 10/22/2020 10/21/2020  Height 5' 9.882" 5\' 10"  5\' 11"   Weight 154 lbs 10 oz 154 lbs 154 lbs  BMI 22.26 23.5 57.32  Systolic 202 542 706  Diastolic 91 84 80  Pulse 73 71 78    Gen: Alert, well appearing.  Patient is oriented to person, place, time, and situation. AFFECT: pleasant, lucid thought and speech. ENT: Ears: EACs clear, normal epithelium.  TMs with good light reflex and landmarks bilaterally.  Eyes: no injection,  icteris, swelling, or exudate.  EOMI, PERRLA. Nose: no drainage or turbinate edema/swelling.  No injection or focal lesion.  Mouth: lips without lesion/swelling.  Oral mucosa pink and moist.  Dentition intact and without obvious caries or gingival swelling.  Oropharynx without erythema, exudate, or swelling.  Neck: supple/nontender.  No LAD, mass, or TM.  Carotid pulses 2+ bilaterally, without bruits. CV: RRR, no m/r/g.   LUNGS: CTA bilat, nonlabored resps, good aeration in all lung fields. ABD: soft, NT, ND, BS normal.  No hepatospenomegaly or mass.  No bruits. EXT: no clubbing, cyanosis, or edema.  Musculoskeletal: no joint swelling, erythema, warmth, or tenderness.  ROM of all joints intact. Skin -left side of neck with hyperkeratotic papule about 1 to 2 mm in size.  No erythema or tenderness.  Otherwise no sores or suspicious lesions or rashes or color changes  Pertinent labs:  Lab Results  Component Value Date   TSH 0.48 09/29/2020   Lab Results  Component Value Date   WBC 4.7 10/22/2020   HGB 14.1 10/22/2020   HCT 42.0 10/22/2020   MCV 93.6 10/22/2020   PLT 134.0 (L) 10/22/2020   Lab Results  Component Value Date   CREATININE 0.93 10/01/2020   BUN 24 (H) 10/01/2020   NA 137 10/01/2020   K 3.7 10/01/2020   CL 105 10/01/2020   CO2 25 10/01/2020   Lab Results  Component Value Date   ALT 20 10/01/2020   AST 38 10/01/2020   ALKPHOS 65 10/01/2020   BILITOT 0.4 10/01/2020   Lab Results  Component Value Date   CHOL 175 03/27/2020   Lab Results  Component Value Date   HDL 46.30 03/27/2020   Lab Results  Component Value Date   LDLCALC 116 (H) 03/27/2020   Lab Results  Component Value Date   TRIG 63.0 03/27/2020   Lab Results  Component Value Date   CHOLHDL 4 03/27/2020   Lab Results  Component Value Date   PSA 0.07 (L) 09/29/2020   PSA 0.047 12/11/2019   PSA 0.028 11/02/2018   Lab Results  Component Value Date   HGBA1C 5.2 01/02/2020   ASSESSMENT AND  PLAN:   1) HTN: Uncontrolled.  Add clonidine 0.1 mg in the morning and again at 2:00.  Continue lisinopril 20 mg twice a day. Lytes/cr today.  2) HLD: History of statin intolerance. LDL goal is less than 70. He has declined referral to advanced lipid clinic in the past. Continues to decline this option. Lipid panel and hepatic panel today.  3) skin lesion of neck. Appears benign.  Patient desires excision. Consent obtained.   Area cleaned with Betadine, derma blade used to excise lesion at the base in its entirety, placed in specimen cup and sent to dermatology pathology. Patient tolerated procedure well and there were no immediate complications.  4) palpable abdominal aorta. I have noted this in the past and checked abdominal ultrasound about 8 years ago.  Would like to follow-up aortic ultrasound at this time--ordered today.  5) Health maintenance exam: Reviewed age and gender appropriate health maintenance issues (prudent diet, regular exercise, health risks of tobacco and excessive alcohol, use of seatbelts, fire alarms in home, use of sunscreen).  Also reviewed age and gender appropriate health screening as well as vaccine recommendations. Vaccines: Shingrix->declined.  Tdap-->declined.  Flu->declined. Labs: lipid panel, cmet. Prostate ca screening: has hx of prostate ca, followed by urol, most recent PSA 08/2020 essentially undetectable.  He wants to try to avoid excessive trips to doctors and asks if I will repeat his PSA today rather than having  to go to urologist for this so I did draw this test.  Of course, he understands that if PSA is rising he will need to go immediately back to urology. Colon ca screening: last colonoscopy 2011, no polyps per pt report-->due for repeat-->he defers at this time.  An After Visit Summary was printed and given to the patient.  FOLLOW UP:  Return in about 2 weeks (around 02/18/2021) for f/u HTN.  Signed:  Crissie Sickles, MD           02/04/2021

## 2021-02-12 ENCOUNTER — Ambulatory Visit
Admission: RE | Admit: 2021-02-12 | Discharge: 2021-02-12 | Disposition: A | Discharge status: Home / Self Care | Payer: Medicare Other | Source: Ambulatory Visit | Attending: Family Medicine | Admitting: Family Medicine

## 2021-02-12 ENCOUNTER — Encounter: Payer: Self-pay | Admitting: Family Medicine

## 2021-02-12 DIAGNOSIS — Z87891 Personal history of nicotine dependence: Secondary | ICD-10-CM | POA: Diagnosis not present

## 2021-02-12 DIAGNOSIS — Z136 Encounter for screening for cardiovascular disorders: Secondary | ICD-10-CM | POA: Diagnosis not present

## 2021-02-12 DIAGNOSIS — R0989 Other specified symptoms and signs involving the circulatory and respiratory systems: Secondary | ICD-10-CM

## 2021-02-16 ENCOUNTER — Other Ambulatory Visit: Payer: Self-pay

## 2021-02-16 ENCOUNTER — Ambulatory Visit (INDEPENDENT_AMBULATORY_CARE_PROVIDER_SITE_OTHER): Payer: Medicare Other | Admitting: Family Medicine

## 2021-02-16 ENCOUNTER — Encounter: Payer: Self-pay | Admitting: Family Medicine

## 2021-02-16 VITALS — BP 180/89 | HR 64 | Temp 97.4°F | Ht 69.88 in | Wt 154.2 lb

## 2021-02-16 DIAGNOSIS — I10 Essential (primary) hypertension: Secondary | ICD-10-CM | POA: Diagnosis not present

## 2021-02-16 DIAGNOSIS — I7143 Infrarenal abdominal aortic aneurysm, without rupture: Secondary | ICD-10-CM

## 2021-02-16 DIAGNOSIS — F17200 Nicotine dependence, unspecified, uncomplicated: Secondary | ICD-10-CM

## 2021-02-16 DIAGNOSIS — E7800 Pure hypercholesterolemia, unspecified: Secondary | ICD-10-CM | POA: Diagnosis not present

## 2021-02-16 DIAGNOSIS — Z789 Other specified health status: Secondary | ICD-10-CM

## 2021-02-16 DIAGNOSIS — I739 Peripheral vascular disease, unspecified: Secondary | ICD-10-CM | POA: Diagnosis not present

## 2021-02-16 MED ORDER — HYDROCHLOROTHIAZIDE 25 MG PO TABS: 25.0000 mg | ORAL_TABLET | Freq: Every day | ORAL | 0 refills | Status: DC

## 2021-02-16 NOTE — Progress Notes (Signed)
OFFICE VISIT  02/16/2021  CC:  Chief Complaint  Patient presents with   Follow-up    Hypertension; not fasting   HPI:    Patient is a 76 y.o. male who presents for 2-week follow-up hypertension. A/P as of last visit: "1) HTN: Uncontrolled.  Add clonidine 0.1 mg in the morning and again at 2:00.  Continue lisinopril 20 mg twice a day. Lytes/cr today.   2) HLD: History of statin intolerance. LDL goal is less than 70. He has declined referral to advanced lipid clinic in the past. Continues to decline this option. Lipid panel and hepatic panel today.   3) skin lesion of neck. Appears benign.  Patient desires excision. Consent obtained.   Area cleaned with Betadine, derma blade used to excise lesion at the base in its entirety, placed in specimen cup and sent to dermatology pathology. Patient tolerated procedure well and there were no immediate complications.   4) palpable abdominal aorta. I have noted this in the past and checked abdominal ultrasound about 8 years ago.  Would like to follow-up aortic ultrasound at this time--ordered today.   5) Health maintenance exam: Reviewed age and gender appropriate health maintenance issues (prudent diet, regular exercise, health risks of tobacco and excessive alcohol, use of seatbelts, fire alarms in home, use of sunscreen).  Also reviewed age and gender appropriate health screening as well as vaccine recommendations. Vaccines: Shingrix->declined.  Tdap-->declined.  Flu->declined. Labs: lipid panel, cmet. Prostate ca screening: has hx of prostate ca, followed by urol, most recent PSA 08/2020 essentially undetectable.  He wants to try to avoid excessive trips to doctors and asks if I will repeat his PSA today rather than having to go to urologist for this so I did draw this test.  Of course, he understands that if PSA is rising he will need to go immediately back to urology. Colon ca screening: last colonoscopy 2011, no polyps per pt report-->due  for repeat-->he defers at this time."  INTERIM HX: Last visit his PSA was 0.1 and this has been rising just a tiny bit each year--I recommended he go back to [name] for further follow-up of this now. Cholesterol elevated similar to past levels. Aortic ultrasound 02/12/2021 showed some progression of aortic aneurysm.  I recommended he see vascular surgery.  He wanted to discuss this with me today before deciding on referral See below for details: IMPRESSION: 1. Interval increase in size of infrarenal abdominal aortic aneurysm now measuring 4 cm in maximum diameter, previously, 2.9 cm (when compared to abdominal CT performed 05/2016). Aortic aneurysm NOS (ICD10-I71.9). 2. 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 3. Further evaluation of both entities can be performed with CTA of the abdomen and pelvis as indicated.  Currently: [name] feels well.  Home blood pressures seem to be 785Y systolic in the evenings.  In the mornings is more in the 160s to 170s range. Diastolics 85O to 27X. Feels like clonidine when takes it in the evening is making his heart pound.  He has decreased to a half a tab in the evening and this seems to have helped deviate this symptom.  We discussed recent findings on his abdominal aortic ultrasound today.   Past Medical History:  Diagnosis Date   Abdominal aortic aneurysm 01/2021   4 cm, infrarenal.  Also enlarged left common iliac artery--referred to vascular surgery   Anxiety    Atrial fibrillation [facility])    Limited episode, emergency room, June, 2013, spontaneous conversion to  sinus rhythm, was evaluated By [name] 2013- no further visits.  Post-op (CABG) a-fib, converted on amio but pt self d/c'd this med due to DOE/side effect profile.   CAD (coronary artery disease) 05/2016   a. 05/2016: NSTEMI with cath showing 3-vessel disease. Underwent CABG on 05/12/16 with LIMA-D1, SVG-LAD, and SVG-PDA.    COPD (chronic  obstructive pulmonary disease) ([place]) fall 2016   Bullous changes noted on lower lung images of CT abd done by urology   Diverticulitis    Double vision 01/2020   MG ab w/u NEG.  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 Unable to have MRI due to having shrapnel in forehead.   Hyperlipidemia    statin intolerant. Pt declined advanced lipid clinic referral 08/2019, 03/2020, and 01/2021.   Hypertension    Ischemic cardiomyopathy 05/2016   EF 20-25% at the time of NSTEMI/CABG.  Repeat EF 07/2016 30-35%.  07/2017 EF 25-30%, pt intol of entresto, bidil, and BB--for ICD 03/2019.   Microscopic hematuria Fall 2016   CT nl except nonobstructing stones.  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  Urol ([name]) at [facility]; Daniel [name] referred him to [name] 03/2015: patient got bilat nerve sparing , robot assisted laparoscopic radical prostatectomy and pelvic lymphadenectomy.  Urol f/u, PSA undetectable on repeat 04/22/16 and 10/28/16; pt chose PSA surveill over adjuv rad tx. PSA undetectable as of 2021   RLS (restless legs syndrome)    Tobacco dependence    down to 1-2 cigs per day as of 11/2015.  Restarted 08/2016.   Urinary retention 05/2015   occurred s/p foley removal    Past Surgical History:  Procedure Laterality Date   aortic ultrasound  07/2012   NO AAA   COLONOSCOPY  approx 2011 per pt   Done by surgeon in [place] after a bout of  diverticulitis; normal per pt report--was told to repeat in 10 yrs.   CORONARY ARTERY BYPASS GRAFT N/A 05/12/2016   Procedure: CORONARY ARTERY BYPASS GRAFTING (CABG) TIMES THREE USING LEFT INTERNAL MAMMARY ARTERY AND RIGHT SAPHENOUS LEG VEIN HARVESTED ENDOSCOPICALLY;  Surgeon: [name], MD;  Location: [place];  Service: Open Heart Surgery;  Laterality: N/A;   ICD IMPLANT N/A 04/12/2019   Procedure: ICD IMPLANT;  Surgeon: Evans Lance, MD;  [facility] [facility];  Service: Cardiovascular;  Laterality: N/A;   INGUINAL HERNIA REPAIR  2003 and 2004   Both sides.   LAPAROSCOPY  2017   LEFT HEART CATH AND CORONARY ANGIOGRAPHY N/A 05/09/2016   Procedure: Left Heart Cath and Coronary Angiography;  Surgeon: [name], MD;  Location: [place] [facility];  Service: Cardiovascular;  Laterality: N/A;   LITHOTRIPSY  2004   Dr. Maryland Pink in [place].   LYMPHADENECTOMY Bilateral 04/30/2015   Procedure: PELVIC LYMPHADENECTOMY;  Surgeon: [name], MD;  [facility];  Service: Urology;  Laterality: Bilateral;  PROSTATE BIOPSY  02/05/15   Prostate adenocarcinoma: pt considering treatment options as of 02/19/15   ROBOT ASSISTED LAPAROSCOPIC RADICAL PROSTATECTOMY N/A 04/30/2015   Procedure: XI ROBOTIC ASSISTED LAPAROSCOPIC RADICAL PROSTATECTOMY LEVEL 2;  Surgeon: [name], MD;  [facility];  Service: Urology;  Laterality: N/A;   TEE WITHOUT CARDIOVERSION N/A 05/12/2016   Procedure: TRANSESOPHAGEAL ECHOCARDIOGRAM (TEE);  Surgeon: [name], MD;  Location: [place];  Service: Open Heart Surgery;  Laterality: N/A;   TRANSTHORACIC ECHOCARDIOGRAM  03/22/2006; 05/2016; 07/2016; 07/2017; 01/02/20   2008: EF 65%, normal valves, no wall motion abnormalties, LA size normal.  05/2016 in context of non-STEMI, EF 20-25%, mild AV and MV regurg.  08/05/16: EF 30-35%, akinesis of mid lateral myoc, grd II DD, mild aortic 07/2017: EF 25-30%, mult areas of akinesis, grd I DD. 01/2020 EF 25-30%, sev LV dsfxn,  valves fine, aortic root 91mm    Outpatient Medications Prior to Visit  Medication Sig Dispense Refill   ALPRAZolam (XANAX) 0.25 MG tablet 1-2 tabs po qhs prn insomnia/restless legs 60 tablet 5   aspirin EC 81 MG tablet Take 1 tablet (81 mg total) by mouth daily. Swallow whole. 90 tablet 3   benazepril (LOTENSIN) 20 MG tablet Take 1 tablet (20 mg total) by mouth daily. 1 tab po bid 180 tablet 3   Omega-3 Fatty Acids (FISH OIL) 1000 MG CAPS Take 1,000 mg by mouth daily.      ondansetron (ZOFRAN) 4 MG tablet Take 1 tablet (4 mg total) by mouth every 8 (eight) hours as needed for nausea or vomiting. 20 tablet 1   Polyethyl Glycol-Propyl Glycol (LUBRICANT EYE DROPS) 0.4-0.3 % SOLN Place 1 drop into both eyes 3 (three) times daily as needed (dry/irritated eyes.).     Vitamin D, Cholecalciferol, 400 units CAPS Take 400 Units by mouth daily.      cloNIDine (CATAPRES) 0.1 MG tablet 1 tab po qAM and q 2pm 60 tablet 0   No facility-administered medications prior to visit.    Allergies  Allergen Reactions   Entresto [Sacubitril-Valsartan] Hives and Shortness Of Breath   Amlodipine Swelling    LE swelling   Contrast Media [Iodinated Contrast Media] Other (See Comments)    Prostate problem had to wear a catheter for two weeks after having dye.    Hydralazine Palpitations and Other (See Comments)    Fatigue+    ROS As per HPI  PE: Vitals with BMI 02/16/2021 02/04/2021 10/22/2020  Height 5' 9.88" 5' 9.882" 5\' 10"   Weight 154 lbs 3 oz 154 lbs 10 oz 154 lbs  BMI 22.2 08.65 78.4  Systolic 696 295 284  Diastolic 89 91 84  Pulse 64 73 71     Physical Exam  Gen: Alert, well appearing.  Patient is oriented to person, place, time, and situation. AFFECT: pleasant, lucid thought and speech. No further exam today.  LABS:  Last CBC Lab Results  Component Value Date   WBC 4.7 10/22/2020   HGB 14.1 10/22/2020   HCT 42.0 10/22/2020   MCV 93.6 10/22/2020   MCH 31.1 10/01/2020   RDW 14.4  10/22/2020   PLT 134.0 (L) 13/24/4010   Last metabolic panel Lab Results  Component Value Date   GLUCOSE 86 02/04/2021   NA 136 02/04/2021   K 4.8 02/04/2021   CL 103 02/04/2021   CO2 28 02/04/2021   BUN 18 02/04/2021   CREATININE 1.00 02/04/2021   GFRNONAA >60 10/01/2020   CALCIUM 9.6 02/04/2021  PROT 6.4 02/04/2021   ALBUMIN 4.2 02/04/2021   LABGLOB 2.2 09/16/2016   AGRATIO 2.0 09/16/2016   BILITOT 0.8 02/04/2021   ALKPHOS 74 02/04/2021   AST 18 02/04/2021   ALT 10 02/04/2021   ANIONGAP 7 10/01/2020   Last lipids Lab Results  Component Value Date   CHOL 177 02/04/2021   HDL 44.50 02/04/2021   LDLCALC 121 (H) 02/04/2021   TRIG 58.0 02/04/2021   CHOLHDL 4 02/04/2021   Last hemoglobin A1c Lab Results  Component Value Date   HGBA1C 5.2 01/02/2020   Last thyroid functions Lab Results  Component Value Date   TSH 0.48 09/29/2020   IMPRESSION AND PLAN:  1) Hypertension, uncontrolled. Intolerant of clonidine. Several allergies to antihypertensives. Review of old med list shows HCTZ but no mention of any allergy and patient does not recall why he is not still on this. I chose to restart HCTZ 25 mg today.  Continue benazepril 20 mg twice a day.  #2 peripheral arterial disease.  Recent abdominal aortic ultrasound showed 4 cm distal AAA abdominal aortic aneurysm.  Also ectatic right common iliac artery. Discussed need for referral to vascular surgery for monitoring, expert evaluation, and guidance. He was certainly in agreement with this today.  Continue daily aspirin.  Continue to work on blood pressure control.  He smokes cigarettes "just a little "and I encouraged him to completely stop. History of statin intolerance.  He has declined further medication trial but will bring this up again at next visit.  No labs needed today. Plan bmet at follow-up in 7 to 10 days.  An After Visit Summary was printed and given to the patient.  FOLLOW UP: Return for 7-10 day f/u  BP.  Signed:  Crissie Sickles, MD           02/16/2021

## 2021-03-03 ENCOUNTER — Encounter: Payer: Self-pay | Admitting: Vascular Surgery

## 2021-03-03 ENCOUNTER — Other Ambulatory Visit: Payer: Self-pay

## 2021-03-03 ENCOUNTER — Ambulatory Visit: Payer: Medicare Other | Admitting: Vascular Surgery

## 2021-03-03 VITALS — BP 198/90 | HR 76 | Temp 98.8°F | Resp 20 | Ht 69.0 in | Wt 154.0 lb

## 2021-03-03 DIAGNOSIS — I714 Abdominal aortic aneurysm, without rupture, unspecified: Secondary | ICD-10-CM

## 2021-03-03 NOTE — Progress Notes (Signed)
Patient ID: Daniel Sandhoff., male   DOB: [birth year], 76 y.o.   MRN: [id number]  Reason for Consult: New Patient (Initial Visit)   Referred by [name], MD  Subjective:     HPI:  Daniel Kerkhoff. is a 76 y.o. male with known abdominal aortic aneurysm.  Most recently he underwent ultrasound demonstrated 4 cm.  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  He is a lifelong smoker most recently only smokes approximately 3 cigarettes a day.  No family history of aneurysm disease.  He does have 2 sons 1 of which has been a smoker.  He walks without limitation.  No previous history of peripheral arterial disease.  Denies any history of stroke, TIA or amaurosis.  Does have known coronary artery disease status post CABG 5 years ago.  He is on aspirin, takes no blood thinners.  Past Medical History:  Diagnosis Date   Abdominal aortic aneurysm 01/2021   4 cm, infrarenal.  Also enlarged left common iliac artery--referred to vascular surgery   Anxiety    Atrial fibrillation ([place])    Limited episode, emergency room, June, 2013, spontaneous conversion to sinus rhythm, was evaluated By [name] 2013- no further visits.  Post-op (CABG) a-fib, converted on amio but pt self d/c'd this med due to DOE/side effect profile.   CAD (coronary artery disease) 05/2016   a. 05/2016: NSTEMI with cath showing 3-vessel disease. Underwent CABG on 05/12/16 with LIMA-D1, SVG-LAD, and SVG-PDA.    COPD (chronic obstructive pulmonary disease) ([place]) fall 2016   Bullous changes noted on lower lung images of CT abd done by urology   Diverticulitis    Double vision 01/2020   MG ab w/u NEG.  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 Unable to have MRI due to having shrapnel in forehead.   Hyperlipidemia    statin intolerant. Pt declined advanced lipid clinic referral 08/2019, 03/2020, and 01/2021.   Hypertension    Ischemic cardiomyopathy 05/2016   EF 20-25% at the time of NSTEMI/CABG.  Repeat EF 07/2016 30-35%.  07/2017 EF 25-30%, pt intol of entresto, bidil, and BB--for ICD 03/2019.   Microscopic hematuria Fall 2016   CT nl except nonobstructing stones.  Cystoscopy normal 12/30/14 ([name])   Nephrolithiasis    11 mm stone on R, 2 mm stone on L, ureters clear   Non-STEMI (non-ST elevated myocardial infarction) ([place]) 05/2016   with impaired LV function   Prostatic adenocarcinoma ([place]) 02/2015   No evidence of metastatic dz on CT pelv 02/2015.  Urol ([name]) at [facility]; [name] referred him to [name] 03/2015: patient got bilat nerve sparing , robot assisted laparoscopic radical prostatectomy and pelvic lymphadenectomy.  Urol f/u, PSA undetectable on repeat 04/22/16 and 10/28/16; pt chose PSA surveill over adjuv rad tx. PSA undetectable as of 2021   RLS (restless legs syndrome)    Tobacco dependence    down to 1-2 cigs per day as of 11/2015.  Restarted 08/2016.   Urinary retention 05/2015   occurred s/p foley removal   Family History  Problem Relation Age of Onset  Hypertension Mother    Cancer - Other Mother 62       breast   Cancer Father        Lung ca age 38   Diabetes Sister    Past Surgical History:  Procedure Laterality Date   aortic ultrasound  07/2012   2014 NO AAA.  01/2021-->4 cm AAA with R common iliac ectasia-->vasc surg ref   COLONOSCOPY  approx 2011 per pt   Done by surgeon in [place] after a bout of diverticulitis; normal per pt report--was told to repeat in 10 yrs.   CORONARY ARTERY BYPASS GRAFT N/A 05/12/2016   Procedure: CORONARY ARTERY BYPASS GRAFTING (CABG) TIMES THREE USING LEFT INTERNAL MAMMARY ARTERY AND RIGHT SAPHENOUS LEG  VEIN HARVESTED ENDOSCOPICALLY;  Surgeon: [name], MD;  Location: [place];  Service: Open Heart Surgery;  Laterality: N/A;   ICD IMPLANT N/A 04/12/2019   Procedure: ICD IMPLANT;  Surgeon: Evans Lance, MD;  Location: [place] [facility];  Service: Cardiovascular;  Laterality: N/A;   INGUINAL HERNIA REPAIR  2003 and 2004   Both sides.   LAPAROSCOPY  2017   LEFT HEART CATH AND CORONARY ANGIOGRAPHY N/A 05/09/2016   Procedure: Left Heart Cath and Coronary Angiography;  Surgeon: [name], MD;  Location: [place] [facility];  Service: Cardiovascular;  Laterality: N/A;   LITHOTRIPSY  2004   Dr. Maryland Pink in [place].   LYMPHADENECTOMY Bilateral 04/30/2015   Procedure: PELVIC LYMPHADENECTOMY;  Surgeon: [name], MD;  [facility];  Service: Urology;  Laterality: Bilateral;   PROSTATE BIOPSY  02/05/2015   Prostate adenocarcinoma: pt considering treatment options as of 02/19/15   ROBOT ASSISTED LAPAROSCOPIC RADICAL PROSTATECTOMY N/A 04/30/2015   Procedure: XI ROBOTIC ASSISTED LAPAROSCOPIC RADICAL PROSTATECTOMY LEVEL 2;  Surgeon: [name], MD;  [facility];  Service: Urology;  Laterality: N/A;   TEE WITHOUT CARDIOVERSION N/A 05/12/2016   Procedure: TRANSESOPHAGEAL ECHOCARDIOGRAM (TEE);  Surgeon: [name], MD;  Location: [place];  Service: Open Heart Surgery;  Laterality: N/A;   TRANSTHORACIC ECHOCARDIOGRAM  03/22/2006; 05/2016; 07/2016; 07/2017; 01/02/20   2008: EF 65%, normal valves, no wall motion abnormalties, LA size normal.  05/2016 in context of non-STEMI, EF 20-25%, mild AV and MV regurg.  08/05/16: EF 30-35%, akinesis of mid lateral myoc, grd II DD, mild aortic 07/2017: EF 25-30%, mult areas of akinesis, grd I DD. 01/2020 EF 25-30%, sev LV dsfxn, valves fine, aortic root 86mm    Short Social History:  Social History   Tobacco Use   Smoking status: Some Days    Packs/day: 1.00    Years: 55.00    Pack years: 55.00    Types: Cigarettes   Smokeless tobacco:  Never   Tobacco comments:    quit in 2017  Substance Use Topics   Alcohol use: No    Allergies  Allergen Reactions   Entresto [Sacubitril-Valsartan] Hives and Shortness Of Breath   Amlodipine Swelling    LE swelling   Contrast Media [Iodinated Contrast Media] Other (See Comments)    Prostate problem had to wear a catheter for two weeks after having dye.    Hydralazine Palpitations and Other (See Comments)    Fatigue+    Current Outpatient Medications  Medication Sig Dispense Refill   ALPRAZolam (XANAX) 0.25 MG tablet 1-2 tabs po qhs prn insomnia/restless legs 60 tablet 5   aspirin EC 81 MG tablet Take 1 tablet (81 mg total) by mouth daily. Swallow whole. 90 tablet 3  benazepril (LOTENSIN) 20 MG tablet Take 1 tablet (20 mg total) by mouth daily. 1 tab po bid 180 tablet 3   hydrochlorothiazide (HYDRODIURIL) 25 MG tablet Take 1 tablet (25 mg total) by mouth daily. 30 tablet 0   Omega-3 Fatty Acids (FISH OIL) 1000 MG CAPS Take 1,000 mg by mouth daily.      ondansetron (ZOFRAN) 4 MG tablet Take 1 tablet (4 mg total) by mouth every 8 (eight) hours as needed for nausea or vomiting. 20 tablet 1   Polyethyl Glycol-Propyl Glycol (LUBRICANT EYE DROPS) 0.4-0.3 % SOLN Place 1 drop into both eyes 3 (three) times daily as needed (dry/irritated eyes.).     Vitamin D, Cholecalciferol, 400 units CAPS Take 400 Units by mouth daily.      No current facility-administered medications for this visit.    Review of Systems  Constitutional:  Constitutional negative. HENT: HENT negative.  Eyes: Eyes negative.  Respiratory: Respiratory negative.  Cardiovascular: Positive for irregular heartbeat.  GI: Gastrointestinal negative.  Musculoskeletal: Musculoskeletal negative.  Skin: Skin negative.  Neurological: Neurological negative. Hematologic: Hematologic/lymphatic negative.  Psychiatric: Psychiatric negative.       Objective:  Objective   Vitals:   03/03/21 1023  BP: (!) 198/90  Pulse: 76   Resp: 20  Temp: 98.8 F (37.1 C)  SpO2: 99%  Weight: 154 lb (69.9 kg)  Height: 5\' 9"  (1.753 m)   Body mass index is 22.74 kg/m.  Physical Exam HENT:     Head: Normocephalic.     Nose:     Comments: Wearing a mask Eyes:     Pupils: Pupils are equal, round, and reactive to light.  Cardiovascular:     Pulses:          Radial pulses are 2+ on the right side and 2+ on the left side.       Popliteal pulses are 3+ on the right side and 3+ on the left side.  Abdominal:     General: Abdomen is flat.     Palpations: Abdomen is soft. There is mass.  Skin:    General: Skin is warm and dry.     Capillary Refill: Capillary refill takes less than 2 seconds.  Neurological:     General: No focal deficit present.     Mental Status: He is alert.  Psychiatric:        Mood and Affect: Mood normal.        Behavior: Behavior normal.        Thought Content: Thought content normal.        Judgment: Judgment normal.    US IMPRESSION: 1. Interval increase in size of infrarenal abdominal aortic aneurysm now measuring 4 cm in maximum diameter, previously, 2.9 cm (when compared to abdominal CT performed 05/2016). Aortic aneurysm NOS (ICD10-I71.9). 2. 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 3. Further evaluation of both entities can be performed with CTA of the abdomen and pelvis as indicated.       Assessment/Plan:     76 year old male with 4 cm aneurysm by recent ultrasound.  This has grown slightly in the past 5 years.  We will follow him up in 1 year with abdominal aortic aneurysm duplex.  He also has 3+ palpable popliteal pulses bilaterally which we will evaluate at that time.  He does have 2 sons 1 of which is a smoker and he will notify them of his aneurysm disease.  We discussed the signs and symptoms of rupture  and/or thrombosis of his popliteal aneurysms he demonstrates good understanding.  Short of this we will see him in 1 year with  follow-up ultrasounds.     Waynetta Sandy MD [facility]

## 2021-03-11 ENCOUNTER — Other Ambulatory Visit: Payer: Self-pay

## 2021-03-11 ENCOUNTER — Encounter: Payer: Self-pay | Admitting: Family Medicine

## 2021-03-11 ENCOUNTER — Ambulatory Visit (INDEPENDENT_AMBULATORY_CARE_PROVIDER_SITE_OTHER): Payer: Medicare Other | Admitting: Family Medicine

## 2021-03-11 VITALS — BP 158/74 | HR 70 | Temp 97.9°F | Ht 69.0 in | Wt 153.2 lb

## 2021-03-11 DIAGNOSIS — I10 Essential (primary) hypertension: Secondary | ICD-10-CM

## 2021-03-11 MED ORDER — BISOPROLOL FUMARATE 5 MG PO TABS: 5.0000 mg | ORAL_TABLET | Freq: Every day | ORAL | 0 refills | Status: DC

## 2021-03-11 NOTE — Progress Notes (Signed)
OFFICE VISIT  03/11/2021  CC:  Chief Complaint  Patient presents with   Follow-up    Hypertension, pt is fasting     Patient is a 76 y.o. male who presents for f/u AAA and uncontrolled hypertension.   INTERIM HX: Patient feels well. He continues to take HCTZ 25 mg qd and Benazepril 20 mg qd. 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 The feelings resides after he rest for a little bit. He doesn't have any blurry vision, chest pain or headaches.   He saw vascular surgeon 03/03/21.  Dr. Gwenlyn Saran recommended patient get repeat abdominal aortic ultrasound in 1 year. No intervention recommended at this time.  Past Medical History:  Diagnosis Date   Abdominal aortic aneurysm 01/2021   4 cm, infrarenal.  Also enlarged left common iliac artery--referred to vascular surgery   Anxiety    Atrial fibrillation ([place])    Limited episode, emergency room, June, 2013, spontaneous conversion to sinus rhythm, was evaluated By [name] 2013- no further visits.  Post-op (CABG) a-fib, converted on amio but pt self d/c'd this med due to DOE/side effect profile.   CAD (coronary artery disease) 05/2016   a. 05/2016: NSTEMI with cath showing 3-vessel disease. Underwent CABG on 05/12/16 with LIMA-D1, SVG-LAD, and SVG-PDA.    COPD (chronic obstructive pulmonary disease) ([place]) fall 2016   Bullous changes noted on lower lung images of CT abd done by urology   Diverticulitis    Double vision 01/2020   MG ab w/u NEG.  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 Unable to have MRI due to having shrapnel in forehead.   Hyperlipidemia    statin intolerant. Pt declined advanced  lipid clinic referral 08/2019, 03/2020, and 01/2021.   Hypertension    Ischemic cardiomyopathy 05/2016   EF 20-25% at the time of NSTEMI/CABG.  Repeat EF 07/2016 30-35%.  07/2017 EF 25-30%, pt intol of entresto, bidil, and BB--for ICD 03/2019.   Microscopic hematuria Fall 2016   CT nl except nonobstructing stones.  Cystoscopy normal 12/30/14 (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  Urol ([name]) at [facility]; [name] referred him to [name] 03/2015: patient got bilat nerve sparing , robot assisted laparoscopic radical prostatectomy and pelvic lymphadenectomy.  Urol f/u, PSA undetectable on repeat 04/22/16 and 10/28/16; pt chose PSA surveill over adjuv rad tx. PSA undetectable as of 2021   RLS (restless legs syndrome)    Tobacco dependence    down to 1-2 cigs per day as of 11/2015.  Restarted 08/2016.   Urinary retention 05/2015   occurred s/p foley removal    Past Surgical History:  Procedure Laterality Date   aortic ultrasound  07/2012   2014 NO AAA.  01/2021-->4 cm AAA with R common iliac ectasia-->vasc surg ref   COLONOSCOPY  approx 2011 per pt   Done by surgeon in [place] after a bout of diverticulitis; normal  per pt report--was told to repeat in 10 yrs.   CORONARY ARTERY BYPASS GRAFT N/A 05/12/2016   Procedure: CORONARY ARTERY BYPASS GRAFTING (CABG) TIMES THREE USING LEFT INTERNAL MAMMARY ARTERY AND RIGHT SAPHENOUS LEG VEIN HARVESTED ENDOSCOPICALLY;  Surgeon: [name], MD;  Location: [place];  Service: Open Heart Surgery;  Laterality: N/A;   ICD IMPLANT N/A 04/12/2019   Procedure: ICD IMPLANT;  Surgeon: Evans Lance, MD;  Location: [facility];  Service: Cardiovascular;  Laterality: N/A;   INGUINAL HERNIA REPAIR  2003 and 2004   Both sides.    LAPAROSCOPY  2017   LEFT HEART CATH AND CORONARY ANGIOGRAPHY N/A 05/09/2016   Procedure: Left Heart Cath and Coronary Angiography;  Surgeon: [name], MD;  Location: [facility];  Service: Cardiovascular;  Laterality: N/A;   LITHOTRIPSY  2004   Dr. Maryland Pink in [place].   LYMPHADENECTOMY Bilateral 04/30/2015   Procedure: PELVIC LYMPHADENECTOMY;  Surgeon: [name], MD;  [facility]: [facility];  Service: Urology;  Laterality: Bilateral;   PROSTATE BIOPSY  02/05/2015   Prostate adenocarcinoma: pt considering treatment options as of 02/19/15   ROBOT ASSISTED LAPAROSCOPIC RADICAL PROSTATECTOMY N/A 04/30/2015   Procedure: XI ROBOTIC ASSISTED LAPAROSCOPIC RADICAL PROSTATECTOMY LEVEL 2;  Surgeon: [name], MD;  [facility];  Service: Urology;  Laterality: N/A;   TEE WITHOUT CARDIOVERSION N/A 05/12/2016   Procedure: TRANSESOPHAGEAL ECHOCARDIOGRAM (TEE);  Surgeon: [name], MD;  Location: [facility];  Service: Open Heart Surgery;  Laterality: N/A;   TRANSTHORACIC ECHOCARDIOGRAM  03/22/2006; 05/2016; 07/2016; 07/2017; 01/02/20   2008: EF 65%, normal valves, no wall motion abnormalties, LA size normal.  05/2016 in context of non-STEMI, EF 20-25%, mild AV and MV regurg.  08/05/16: EF 30-35%, akinesis of mid lateral myoc, grd II DD, mild aortic 07/2017: EF 25-30%, mult areas of akinesis, grd I DD. 01/2020 EF 25-30%, sev LV dsfxn, valves fine, aortic root 48mm    Outpatient Medications Prior to Visit  Medication Sig Dispense Refill   ALPRAZolam (XANAX) 0.25 MG tablet 1-2 tabs po qhs prn insomnia/restless legs 60 tablet 5   aspirin EC 81 MG tablet Take 1 tablet (81 mg total) by mouth daily. Swallow whole. 90 tablet 3   benazepril (LOTENSIN) 20 MG tablet Take 1 tablet (20 mg total) by mouth daily. 1 tab po bid 180 tablet 3   hydrochlorothiazide (HYDRODIURIL) 25 MG tablet Take 1 tablet (25 mg total) by mouth daily. 30 tablet 0   Omega-3 Fatty Acids (FISH OIL) 1000 MG CAPS Take 1,000 mg by  mouth daily.      ondansetron (ZOFRAN) 4 MG tablet Take 1 tablet (4 mg total) by mouth every 8 (eight) hours as needed for nausea or vomiting. 20 tablet 1   Polyethyl Glycol-Propyl Glycol (LUBRICANT EYE DROPS) 0.4-0.3 % SOLN Place 1 drop into both eyes 3 (three) times daily as needed (dry/irritated eyes.).     Vitamin D, Cholecalciferol, 400 units CAPS Take 400 Units by mouth daily.      No facility-administered medications prior to visit.    Allergies  Allergen Reactions   Entresto [Sacubitril-Valsartan] Hives and Shortness Of Breath   Amlodipine Swelling    LE swelling   Contrast Media [Iodinated Contrast Media] Other (See Comments)    Prostate problem had to wear a catheter for two weeks after having dye.    Hydralazine Palpitations and Other (See Comments)    Fatigue+    ROS As per HPI  PE: Vitals  with BMI 03/11/2021 03/03/2021 02/16/2021  Height 5\' 9"  5\' 9"  5' 9.88"  Weight 153 lbs 3 oz 154 lbs 154 lbs 3 oz  BMI 22.61 95.28 41.3  Systolic 244 010 272  Diastolic 74 90 89  Pulse 70 76 64     Physical Exam Constitutional:      Appearance: Normal appearance.  Cardiovascular:     Rate and Rhythm: Normal rate and regular rhythm.     Heart sounds: Normal heart sounds.  Pulmonary:     Effort: Pulmonary effort is normal.     Breath sounds: Normal breath sounds.  Abdominal:     General: Abdomen is flat.     Palpations: Abdomen is soft.  Neurological:     Mental Status: He is alert.    LABS:  Last CBC Lab Results  Component Value Date   WBC 4.7 10/22/2020   HGB 14.1 10/22/2020   HCT 42.0 10/22/2020   MCV 93.6 10/22/2020   MCH 31.1 10/01/2020   RDW 14.4 10/22/2020   PLT 134.0 (L) 53/66/4403   Last metabolic panel Lab Results  Component Value Date   GLUCOSE 86 02/04/2021   NA 136 02/04/2021   K 4.8 02/04/2021   CL 103 02/04/2021   CO2 28 02/04/2021   BUN 18 02/04/2021   CREATININE 1.00 02/04/2021   GFRNONAA >60 10/01/2020   CALCIUM 9.6 02/04/2021   PROT 6.4  02/04/2021   ALBUMIN 4.2 02/04/2021   LABGLOB 2.2 09/16/2016   AGRATIO 2.0 09/16/2016   BILITOT 0.8 02/04/2021   ALKPHOS 74 02/04/2021   AST 18 02/04/2021   ALT 10 02/04/2021   ANIONGAP 7 10/01/2020   IMPRESSION AND PLAN:  Nontoxic, stable Appearing male who presents today for follow up with high blood pressure. Status uncontrolled. BP today is 158/74. Home blood pressures have normal, diastolic readings, but systolic readings ranging from 114 to 168. Will add Bisoprolol 5 mg qd at night to combat HTN that is most often isolated to early mornings.   No labs today  An After Visit Summary was printed and given to the patient.  FOLLOW UP:  One week  Phil Dopp - MS3  Signed:  Crissie Sickles, MD           03/11/2021

## 2021-03-11 NOTE — Progress Notes (Deleted)
OFFICE VISIT  03/11/2021  CC: No chief complaint on file.   Patient is a 76 y.o. male who presents for 3 week f/u AAA and uncontrolled hypertension.   A/P as of last visit: "1) Hypertension, uncontrolled. Intolerant of clonidine. Several allergies to antihypertensives. Review of old med list shows HCTZ but no mention of any allergy and patient does not recall why he is not still on this. I chose to restart HCTZ 25 mg today.  Continue benazepril 20 mg twice a day.   #2 peripheral arterial disease.  Recent abdominal aortic ultrasound showed 4 cm distal AAA abdominal aortic aneurysm.  Also ectatic right common iliac artery. Discussed need for referral to vascular surgery for monitoring, expert evaluation, and guidance. He was certainly in agreement with this today.  Continue daily aspirin.  Continue to work on blood pressure control.  He smokes cigarettes "just a little "and I encouraged him to completely stop. History of statin intolerance.  He has declined further medication trial but will bring this up again at next visit."  INTERIM HX: *** He saw vascular surgeon 03/03/21.  Dr. Gwenlyn Saran recommended patient get repeat abdominal aortic ultrasound in 1 year. No intervention recommended at this time.  Past Medical History:  Diagnosis Date   Abdominal aortic aneurysm 01/2021   4 cm, infrarenal.  Also enlarged left common iliac artery--referred to vascular surgery   Anxiety    Atrial fibrillation ([place])    Limited episode, emergency room, June, 2013, spontaneous conversion to sinus rhythm, was evaluated By [name] 2013- no further visits.  Post-op (CABG) a-fib, converted on amio but pt self d/c'd this med due to DOE/side effect profile.   CAD (coronary artery disease) 05/2016   a. 05/2016: NSTEMI with cath showing 3-vessel disease. Underwent CABG on 05/12/16 with LIMA-D1, SVG-LAD, and SVG-PDA.    COPD (chronic obstructive pulmonary disease) ([place]) fall 2016   Bullous changes noted on lower lung  images of CT abd done by urology   Diverticulitis    Double vision 01/2020   MG ab w/u NEG.  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 Unable to have MRI due to having shrapnel in forehead.   Hyperlipidemia    statin intolerant. Pt declined advanced lipid clinic referral 08/2019, 03/2020, and 01/2021.   Hypertension    Ischemic cardiomyopathy 05/2016   EF 20-25% at the time of NSTEMI/CABG.  Repeat EF 07/2016 30-35%.  07/2017 EF 25-30%, pt intol of entresto, bidil, and BB--for ICD 03/2019.   Microscopic hematuria Fall 2016   CT nl except nonobstructing stones.  Cystoscopy normal 12/30/14 (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  Urol ([name]) at [facility]; [name] referred him to [name] 03/2015: patient got bilat nerve sparing , robot assisted laparoscopic radical prostatectomy and pelvic lymphadenectomy.  Urol f/u, PSA undetectable on repeat 04/22/16 and 10/28/16; pt chose PSA surveill over adjuv rad tx. PSA undetectable as of 2021  RLS (restless legs syndrome)    Tobacco dependence    down to 1-2 cigs per day as of 11/2015.  Restarted 08/2016.   Urinary retention 05/2015   occurred s/p foley removal    Past Surgical History:  Procedure Laterality Date   aortic ultrasound  07/2012   2014 NO AAA.  01/2021-->4 cm AAA with R common iliac ectasia-->vasc surg ref   COLONOSCOPY  approx 2011 per pt   Done by surgeon in [place] after a bout of diverticulitis;  normal per pt report--was told to repeat in 10 yrs.   CORONARY ARTERY BYPASS GRAFT N/A 05/12/2016   Procedure: CORONARY ARTERY BYPASS GRAFTING (CABG) TIMES THREE USING LEFT INTERNAL MAMMARY ARTERY AND RIGHT SAPHENOUS LEG VEIN HARVESTED ENDOSCOPICALLY;  Surgeon: [name], MD;  Location: [facility];  Service: Open Heart Surgery;  Laterality: N/A;   ICD IMPLANT N/A 04/12/2019   Procedure: ICD IMPLANT;  Surgeon: Evans Lance, MD;  Location: [facility] [facility];  Service: Cardiovascular;  Laterality: N/A;   INGUINAL HERNIA REPAIR  2003 and 2004   Both sides.   LAPAROSCOPY  2017   LEFT HEART CATH AND CORONARY ANGIOGRAPHY N/A 05/09/2016   Procedure: Left Heart Cath and Coronary Angiography;  Surgeon: [name], MD;  Location: [facility];  Service: Cardiovascular;  Laterality: N/A;   LITHOTRIPSY  2004   Dr. [place] Pink in [place].   LYMPHADENECTOMY Bilateral 04/30/2015   Procedure: PELVIC LYMPHADENECTOMY;  Surgeon: [name], MD;  [facility];  Service: Urology;  Laterality: Bilateral;   PROSTATE BIOPSY  02/05/2015   Prostate adenocarcinoma: pt considering treatment options as of 02/19/15   ROBOT ASSISTED LAPAROSCOPIC RADICAL PROSTATECTOMY N/A 04/30/2015   Procedure: XI ROBOTIC ASSISTED LAPAROSCOPIC RADICAL PROSTATECTOMY LEVEL 2;  Surgeon: [name], MD;  [facility];  Service: Urology;  Laterality: N/A;   TEE WITHOUT CARDIOVERSION N/A 05/12/2016   Procedure: TRANSESOPHAGEAL ECHOCARDIOGRAM (TEE);  Surgeon: [name], MD;  Location: [facility];  Service: Open Heart Surgery;  Laterality: N/A;   TRANSTHORACIC ECHOCARDIOGRAM  03/22/2006; 05/2016; 07/2016; 07/2017; 01/02/20   2008: EF 65%, normal valves, no wall motion abnormalties, LA size normal.  05/2016 in context of non-STEMI, EF 20-25%, mild AV and MV regurg.  08/05/16: EF 30-35%, akinesis of mid lateral myoc, grd II DD, mild aortic 07/2017: EF 25-30%, mult areas of akinesis, grd I DD. 01/2020 EF 25-30%, sev LV dsfxn,  valves fine, aortic root 80mm    Outpatient Medications Prior to Visit  Medication Sig Dispense Refill   ALPRAZolam (XANAX) 0.25 MG tablet 1-2 tabs po qhs prn insomnia/restless legs 60 tablet 5   aspirin EC 81 MG tablet Take 1 tablet (81 mg total) by mouth daily. Swallow whole. 90 tablet 3   benazepril (LOTENSIN) 20 MG tablet Take 1 tablet (20 mg total) by mouth daily. 1 tab po bid 180 tablet 3   hydrochlorothiazide (HYDRODIURIL) 25 MG tablet Take 1 tablet (25 mg total) by mouth daily. 30 tablet 0   Omega-3 Fatty Acids (FISH OIL) 1000 MG CAPS Take 1,000 mg by mouth daily.      ondansetron (ZOFRAN) 4 MG tablet Take 1 tablet (4 mg total) by mouth every 8 (eight) hours as needed for nausea or vomiting. 20 tablet 1   Polyethyl Glycol-Propyl Glycol (LUBRICANT EYE DROPS) 0.4-0.3 % SOLN Place 1 drop into both eyes 3 (three) times daily as needed (dry/irritated eyes.).     Vitamin D, Cholecalciferol, 400 units CAPS Take 400 Units  by mouth daily.      No facility-administered medications prior to visit.    Allergies  Allergen Reactions   Entresto [Sacubitril-Valsartan] Hives and Shortness Of Breath   Amlodipine Swelling    LE swelling   Contrast Media [Iodinated Contrast Media] Other (See Comments)    Prostate problem had to wear a catheter for two weeks after having dye.    Hydralazine Palpitations and Other (See Comments)    Fatigue+    ROS As per HPI  PE: Vitals with BMI 03/03/2021 02/16/2021 02/04/2021  Height 5\' 9"  5' 9.88" 5' 9.882"  Weight 154 lbs 154 lbs 3 oz 154 lbs 10 oz  BMI 22.73 03.2 12.24  Systolic 825 003 704  Diastolic 90 89 91  Pulse 76 64 73     Physical Exam  ***  LABS:  Last CBC Lab Results  Component Value Date   WBC 4.7 10/22/2020   HGB 14.1 10/22/2020   HCT 42.0 10/22/2020   MCV 93.6 10/22/2020   MCH 31.1 10/01/2020   RDW 14.4 10/22/2020   PLT 134.0 (L) 88/89/1694   Last metabolic panel Lab Results  Component Value Date   GLUCOSE 86 02/04/2021    NA 136 02/04/2021   K 4.8 02/04/2021   CL 103 02/04/2021   CO2 28 02/04/2021   BUN 18 02/04/2021   CREATININE 1.00 02/04/2021   GFRNONAA >60 10/01/2020   CALCIUM 9.6 02/04/2021   PROT 6.4 02/04/2021   ALBUMIN 4.2 02/04/2021   LABGLOB 2.2 09/16/2016   AGRATIO 2.0 09/16/2016   BILITOT 0.8 02/04/2021   ALKPHOS 74 02/04/2021   AST 18 02/04/2021   ALT 10 02/04/2021   ANIONGAP 7 10/01/2020   IMPRESSION AND PLAN:  No problem-specific Assessment & Plan notes found for this encounter.   An After Visit Summary was printed and given to the patient.  FOLLOW UP: No follow-ups on file.  Signed:  Crissie Sickles, MD           03/11/2021

## 2021-03-18 ENCOUNTER — Other Ambulatory Visit: Payer: Self-pay

## 2021-03-18 ENCOUNTER — Encounter: Payer: Self-pay | Admitting: Family Medicine

## 2021-03-18 ENCOUNTER — Ambulatory Visit (INDEPENDENT_AMBULATORY_CARE_PROVIDER_SITE_OTHER): Payer: Medicare Other | Admitting: Family Medicine

## 2021-03-18 VITALS — BP 155/74 | HR 70 | Temp 97.8°F | Ht 69.0 in | Wt 152.8 lb

## 2021-03-18 DIAGNOSIS — I10 Essential (primary) hypertension: Secondary | ICD-10-CM

## 2021-03-18 NOTE — Progress Notes (Signed)
OFFICE VISIT  03/18/2021  CC:  Chief Complaint  Patient presents with   Follow-up    Hypertension.   Patient is a 76 y.o. male who presents for 1 week f/u HTN. Last visit I added bisoprolol 5mg  qd.  INTERIM HX: Jaeven feels good.  Feels no side effects from bisoprolol.  Home blood pressures have ranged 564-332 systolic over 95J diastolic. When his blood pressure gets around 140/70 he usually cuts all of his blood pressure tabs in half because he is afraid his blood pressure will drop below 884 systolic.  At 166 systolic or less he says his legs feel weak.  His evening blood pressure is rarely above 140 now. No low blood pressures or bradycardia.  Past Medical History:  Diagnosis Date   Abdominal aortic aneurysm 01/2021   4 cm, infrarenal.  Also enlarged left common iliac artery--vascular eval 03/2021->recommended u/s rpt 1 yr.   Anxiety    Atrial fibrillation ([place])    Limited episode, emergency room, June, 2013, spontaneous conversion to sinus rhythm, was evaluated By [name] 2013- no further visits.  Post-op (CABG) a-fib, converted on amio but pt self d/c'd this med due to DOE/side effect profile.   CAD (coronary artery disease) 05/2016   a. 05/2016: NSTEMI with cath showing 3-vessel disease. Underwent CABG on 05/12/16 with LIMA-D1, SVG-LAD, and SVG-PDA.    COPD (chronic obstructive pulmonary disease) ([place]) fall 2016   Bullous changes noted on lower lung images of CT abd done by urology   Diverticulitis    Double vision 01/2020   MG ab w/u NEG.  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 Unable to have MRI due to having shrapnel in forehead.   Hyperlipidemia    statin  intolerant. Pt declined advanced lipid clinic referral 08/2019, 03/2020, and 01/2021.   Hypertension    Ischemic cardiomyopathy 05/2016   EF 20-25% at the time of NSTEMI/CABG.  Repeat EF 07/2016 30-35%.  07/2017 EF 25-30%, pt intol of entresto, bidil, and BB--for ICD 03/2019.   Microscopic hematuria Fall 2016   CT nl except nonobstructing stones.  Cystoscopy normal 12/30/14 (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  Urol ([name]) at [facility]; [name] referred him to [name] 03/2015: patient got bilat nerve sparing , robot assisted laparoscopic radical prostatectomy and pelvic lymphadenectomy.  Urol f/u, PSA undetectable on repeat 04/22/16 and 10/28/16; pt chose PSA surveill over adjuv rad tx. PSA undetectable as of 2021   RLS (restless legs syndrome)    Tobacco dependence    down to 1-2 cigs per day as of 11/2015.  Restarted 08/2016.   Urinary retention 05/2015   occurred s/p foley removal    Past Surgical History:  Procedure Laterality Date   aortic ultrasound  07/2012   2014 NO AAA.  01/2021-->4 cm AAA with R common iliac ectasia-->vasc surg ref   COLONOSCOPY  approx 2011 per pt   Done by  surgeon in [place] after a bout of diverticulitis; normal per pt report--was told to repeat in 10 yrs.   CORONARY ARTERY BYPASS GRAFT N/A 05/12/2016   Procedure: CORONARY ARTERY BYPASS GRAFTING (CABG) TIMES THREE USING LEFT INTERNAL MAMMARY ARTERY AND RIGHT SAPHENOUS LEG VEIN HARVESTED ENDOSCOPICALLY;  Surgeon: [name], MD;  Location: [place];  Service: Open Heart Surgery;  Laterality: N/A;   ICD IMPLANT N/A 04/12/2019   Procedure: ICD IMPLANT;  Surgeon: Evans Lance, MD;  Location: [place] [facility];  Service: Cardiovascular;  Laterality: N/A;   INGUINAL HERNIA  REPAIR  2003 and 2004   Both sides.   LAPAROSCOPY  2017   LEFT HEART CATH AND CORONARY ANGIOGRAPHY N/A 05/09/2016   Procedure: Left Heart Cath and Coronary Angiography;  Surgeon: [name], MD;  Location: [place] [facility];  Service: Cardiovascular;  Laterality: N/A;   LITHOTRIPSY  2004   Dr. Maryland Pink in [place].   LYMPHADENECTOMY Bilateral 04/30/2015   Procedure: PELVIC LYMPHADENECTOMY;  Surgeon: [name], MD;  [facility];  Service: Urology;  Laterality: Bilateral;   PROSTATE BIOPSY  02/05/2015   Prostate adenocarcinoma: pt considering treatment options as of 02/19/15   ROBOT ASSISTED LAPAROSCOPIC RADICAL PROSTATECTOMY N/A 04/30/2015   Procedure: XI ROBOTIC ASSISTED LAPAROSCOPIC RADICAL PROSTATECTOMY LEVEL 2;  Surgeon: [name], MD;  [facility];  Service: Urology;  Laterality: N/A;   TEE WITHOUT CARDIOVERSION N/A 05/12/2016   Procedure: TRANSESOPHAGEAL ECHOCARDIOGRAM (TEE);  Surgeon: [name], MD;  Location: [place];  Service: Open Heart Surgery;  Laterality: N/A;   TRANSTHORACIC ECHOCARDIOGRAM  03/22/2006; 05/2016; 07/2016; 07/2017; 01/02/20   2008: EF 65%, normal valves, no wall motion abnormalties, LA size normal.  05/2016 in context of non-STEMI, EF 20-25%, mild AV and MV regurg.  08/05/16: EF 30-35%, akinesis of mid lateral myoc, grd II DD, mild aortic 07/2017: EF 25-30%, mult areas of akinesis, grd I DD. 01/2020 EF 25-30%, sev LV dsfxn, valves fine, aortic root 42mm    Outpatient Medications Prior to Visit  Medication Sig Dispense Refill   ALPRAZolam (XANAX) 0.25 MG tablet 1-2 tabs po qhs prn insomnia/restless legs 60 tablet 5   aspirin EC 81 MG tablet Take 1 tablet (81 mg total) by mouth daily. Swallow whole. 90 tablet 3   benazepril (LOTENSIN) 20 MG tablet Take 1 tablet (20 mg total) by mouth daily. 1 tab po bid 180 tablet 3   bisoprolol (ZEBETA) 5 MG tablet Take 1 tablet (5 mg total) by mouth daily. 30 tablet 0   hydrochlorothiazide (HYDRODIURIL) 25 MG  tablet Take 1 tablet (25 mg total) by mouth daily. 30 tablet 0   Omega-3 Fatty Acids (FISH OIL) 1000 MG CAPS Take 1,000 mg by mouth daily.      ondansetron (ZOFRAN) 4 MG tablet Take 1 tablet (4 mg total) by mouth every 8 (eight) hours as needed for nausea or vomiting. 20 tablet 1   Polyethyl Glycol-Propyl Glycol (LUBRICANT EYE DROPS) 0.4-0.3 % SOLN Place 1 drop into both eyes 3 (three) times daily as needed (dry/irritated eyes.).     Vitamin D, Cholecalciferol, 400 units CAPS Take 400 Units by mouth daily.      No facility-administered medications prior to visit.    Allergies  Allergen Reactions   Entresto [Sacubitril-Valsartan] Hives and Shortness Of Breath   Amlodipine Swelling    LE swelling   Contrast Media [Iodinated Contrast Media] Other (See Comments)    Prostate problem had to wear a catheter for  two weeks after having dye.    Hydralazine Palpitations and Other (See Comments)    Fatigue+    ROS As per HPI  PE: Vitals with BMI 03/18/2021 03/11/2021 03/03/2021  Height 5\' 9"  5\' 9"  5\' 9"   Weight 152 lbs 13 oz 153 lbs 3 oz 154 lbs  BMI 22.55 85.02 77.41  Systolic 287 867 672  Diastolic 74 74 90  Pulse 70 70 76     Physical Exam  Gen: Alert, well appearing.  Patient is oriented to person, place, time, and situation. AFFECT: pleasant, lucid thought and speech. No further exam today.  LABS:  Last metabolic panel Lab Results  Component Value Date   GLUCOSE 86 02/04/2021   NA 136 02/04/2021   K 4.8 02/04/2021   CL 103 02/04/2021   CO2 28 02/04/2021   BUN 18 02/04/2021   CREATININE 1.00 02/04/2021   GFRNONAA >60 10/01/2020   CALCIUM 9.6 02/04/2021   PROT 6.4 02/04/2021   ALBUMIN 4.2 02/04/2021   LABGLOB 2.2 09/16/2016   AGRATIO 2.0 09/16/2016   BILITOT 0.8 02/04/2021   ALKPHOS 74 02/04/2021   AST 18 02/04/2021   ALT 10 02/04/2021   ANIONGAP 7 10/01/2020   IMPRESSION AND PLAN:  #1 hypertension, adequate control. He feels bad if systolic blood pressure is 130  or less.  I think is reasonable for him to adjust his medications as stated in the HPI above. No labs needed today.  He told me a lot about his family's [place] today and hopefully he can bring me a picture or 2 of this at next visit.  An After Visit Summary was printed and given to the patient.  FOLLOW UP: Return in about 3 months (around 06/15/2021) for routine chronic illness f/u.  Signed:  Crissie Sickles, MD           03/18/2021

## 2021-04-09 ENCOUNTER — Ambulatory Visit (INDEPENDENT_AMBULATORY_CARE_PROVIDER_SITE_OTHER): Payer: Medicare Other

## 2021-04-09 DIAGNOSIS — I255 Ischemic cardiomyopathy: Secondary | ICD-10-CM | POA: Diagnosis not present

## 2021-04-09 LAB — CUP PACEART REMOTE DEVICE CHECK
Battery Remaining Longevity: 56 mo
Battery Remaining Percentage: 71 %
Battery Voltage: 2.95 V
Brady Statistic AP VP Percent: 32 %
Brady Statistic AP VS Percent: 59 %
Brady Statistic AS VP Percent: 1 %
Brady Statistic AS VS Percent: 7.2 %
Brady Statistic RA Percent Paced: 90 %
Brady Statistic RV Percent Paced: 33 %
Date Time Interrogation Session: 20230310020214
HighPow Impedance: 59 Ohm
Implantable Lead Implant Date: 20210312
Implantable Lead Implant Date: 20210312
Implantable Lead Location: 753859
Implantable Lead Location: 753860
Implantable Pulse Generator Implant Date: 20210312
Lead Channel Impedance Value: 440 Ohm
Lead Channel Impedance Value: 460 Ohm
Lead Channel Pacing Threshold Amplitude: 0.5 V
Lead Channel Pacing Threshold Amplitude: 1.125 V
Lead Channel Pacing Threshold Pulse Width: 0.5 ms
Lead Channel Pacing Threshold Pulse Width: 0.5 ms
Lead Channel Sensing Intrinsic Amplitude: 1.9 mV
Lead Channel Sensing Intrinsic Amplitude: 12 mV
Lead Channel Setting Pacing Amplitude: 1.375
Lead Channel Setting Pacing Amplitude: 5 V
Lead Channel Setting Pacing Pulse Width: 0.5 ms
Lead Channel Setting Sensing Sensitivity: 0.5 mV
Pulse Gen Serial Number: 111016652

## 2021-04-14 NOTE — Progress Notes (Signed)
Remote ICD transmission.   

## 2021-04-19 ENCOUNTER — Other Ambulatory Visit: Payer: Self-pay | Admitting: Family Medicine

## 2021-04-20 NOTE — Telephone Encounter (Signed)
Requesting: alprazolam ?Contract: 08/08/19 ?UDS: N/A ?Last Visit: 03/18/21 ?Next Visit: 06/17/21 ?Last Refill: 10/06/20(60,5) ? ?Please Advise. Med pending ?

## 2021-04-21 NOTE — Telephone Encounter (Signed)
LM for pt regarding medication ?

## 2021-04-21 NOTE — Telephone Encounter (Signed)
Pt advised refill sent. °

## 2021-05-28 ENCOUNTER — Ambulatory Visit: Payer: Medicare Other | Admitting: Student

## 2021-05-28 ENCOUNTER — Encounter: Payer: Self-pay | Admitting: Student

## 2021-05-28 VITALS — BP 142/98 | HR 74 | Ht 70.0 in | Wt 152.6 lb

## 2021-05-28 DIAGNOSIS — I255 Ischemic cardiomyopathy: Secondary | ICD-10-CM | POA: Diagnosis not present

## 2021-05-28 DIAGNOSIS — I5022 Chronic systolic (congestive) heart failure: Secondary | ICD-10-CM | POA: Diagnosis not present

## 2021-05-28 DIAGNOSIS — I2510 Atherosclerotic heart disease of native coronary artery without angina pectoris: Secondary | ICD-10-CM

## 2021-05-28 DIAGNOSIS — I10 Essential (primary) hypertension: Secondary | ICD-10-CM

## 2021-05-28 LAB — CUP PACEART INCLINIC DEVICE CHECK
Battery Remaining Longevity: 60 mo
Brady Statistic RA Percent Paced: 90 %
Brady Statistic RV Percent Paced: 34 %
Date Time Interrogation Session: 20230428113756
HighPow Impedance: 61.875
Implantable Lead Implant Date: 20210312
Implantable Lead Implant Date: 20210312
Implantable Lead Location: 753859
Implantable Lead Location: 753860
Implantable Pulse Generator Implant Date: 20210312
Lead Channel Impedance Value: 425 Ohm
Lead Channel Impedance Value: 462.5 Ohm
Lead Channel Pacing Threshold Amplitude: 0.5 V
Lead Channel Pacing Threshold Amplitude: 0.875 V
Lead Channel Pacing Threshold Pulse Width: 0.5 ms
Lead Channel Pacing Threshold Pulse Width: 0.5 ms
Lead Channel Sensing Intrinsic Amplitude: 1.8 mV
Lead Channel Sensing Intrinsic Amplitude: 12 mV
Lead Channel Setting Pacing Amplitude: 1.125
Lead Channel Setting Pacing Amplitude: 1.5 V
Lead Channel Setting Pacing Pulse Width: 0.5 ms
Lead Channel Setting Sensing Sensitivity: 0.5 mV
Pulse Gen Serial Number: 111016652

## 2021-05-28 NOTE — Progress Notes (Signed)
? ? ?Electrophysiology Office Note ?Date: 05/28/2021 ? ?ID:  Daniel [name]., DOB 29-Nov-1945, MRN [id number] ? ?PCP: [name], MD ?Primary Cardiologist: [name], MD ?Electrophysiologist: Cristopher Peru, MD  ? ?CC: Routine ICD follow-up ? ?Daniel [name]. is a 76 y.o. male seen today for Cristopher Peru, MD for routine electrophysiology followup.  Since last being seen in our clinic the patient reports doing very well. Remains very active. Has brief, rare palpitations.  he denies chest pain, dyspnea, PND, orthopnea, nausea, vomiting, dizziness, syncope, edema, weight gain, or early satiety. He has not had ICD shocks.  ? ?Device History: ?[employer] Jude Dual Chamber ICD implanted 04/2019 for ICM ? ? ?Past Medical History:  ?Diagnosis Date  ? Abdominal aortic aneurysm ([place]) 01/2021  ? 4 cm, infrarenal.  Also enlarged left common iliac artery--vascular eval 03/2021->recommended u/s rpt 1 yr.  ? Anxiety   ? Atrial fibrillation ([place])   ? Limited episode, emergency room, June, 2013, spontaneous conversion to sinus rhythm, was evaluated By [name] 2013- no further visits.  Post-op (CABG) a-fib, converted on amio but pt self d/c'd this med due to DOE/side effect profile.  ? CAD (coronary artery disease) 05/2016  ? a. 05/2016: NSTEMI with cath showing 3-vessel disease. Underwent CABG on 05/12/16 with LIMA-D1, SVG-LAD, and SVG-PDA.   ? COPD (chronic obstructive pulmonary disease) ([place]) fall 2016  ? Bullous changes noted on lower lung images of CT abd done by urology  ? Diverticulitis   ? Double vision 01/2020  ? MG ab w/u NEG.  +4th nerve palsy/mid brain CVA, small vessels dz->DAPT  ? GERD (gastroesophageal reflux disease)   ? Has received pneumococcal vaccination   ? History of CVA (cerebrovascular accident) without residual deficits 01/2020  ? L midbrain, with mild L 4th CN palsy-->small vessel dz->DAPT x 3 wks then ASA alone (pt's compliance with ASA PRIOR to CVA was questionable). Unable to have MRI due to having  shrapnel in forehead.  ? Hyperlipidemia   ? statin intolerant. Pt declined advanced lipid clinic referral 08/2019, 03/2020, and 01/2021.  ? Hypertension   ? Ischemic cardiomyopathy 05/2016  ? EF 20-25% at the time of NSTEMI/CABG.  Repeat EF 07/2016 30-35%.  07/2017 EF 25-30%, pt intol of entresto, bidil, and BB--for ICD 03/2019.  ? Microscopic hematuria Fall 2016  ? CT nl except nonobstructing stones.  Cystoscopy normal 12/30/14 ([name])  ? Nephrolithiasis   ? 11 mm stone on R, 2 mm stone on L, ureters clear  ? Non-STEMI (non-ST elevated myocardial infarction) ([place]) 05/2016  ? with impaired LV function  ? 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 No evidence of metastatic dz on CT pelv 02/2015.  [facility]rol ([name]) at [facility]; [name] referred him to [name] 03/2015: patient got bilat nerve sparing , robot assisted laparoscopic radical prostatectomy and pelvic lymphadenectomy.  Urol f/u, PSA undetectable on repeat 04/22/16 and 10/28/16; pt chose PSA surveill over adjuv rad tx. PSA undetectable as of 2021  ? RLS (restless legs syndrome)   ? Tobacco dependence   ? down to 1-2 cigs per day as of 11/2015.  Restarted 08/2016.  ? Urinary retention 05/2015  ? occurred s/p foley removal  ? ?Past Surgical History:  ?Procedure Laterality Date  ? aortic ultrasound  07/2012  ? 2014 NO AAA.  01/2021-->4 cm AAA with R common iliac ectasia-->vasc surg ref  ? COLONOSCOPY  approx 2011 per pt  ? Done by surgeon in [place] after a bout  of diverticulitis; normal per pt report--was told to repeat in 10 yrs.  ? CORONARY ARTERY BYPASS GRAFT N/A 05/12/2016  ? Procedure: CORONARY ARTERY BYPASS GRAFTING (CABG) TIMES THREE USING LEFT INTERNAL MAMMARY ARTERY AND RIGHT SAPHENOUS LEG VEIN HARVESTED ENDOSCOPICALLY;  Surgeon: [name], MD;  Location: [place];  Service: Open Heart Surgery;  Laterality: N/A;  ? ICD IMPLANT N/A 04/12/2019  ? Procedure: ICD IMPLANT;  Surgeon: Evans Lance, MD;  Location: [place] [facility];  Service:  Cardiovascular;  Laterality: N/A;  ? INGUINAL HERNIA REPAIR  2003 and 2004  ? Both sides.  ? LAPAROSCOPY  2017  ? LEFT HEART CATH AND CORONARY ANGIOGRAPHY N/A 05/09/2016  ? Procedure: Left Heart Cath and Coronary Angiography;  Surgeon: [name], MD;  Location: [place] [facility];  Service: Cardiovascular;  Laterality: N/A;  ? LITHOTRIPSY  2004  ? Dr. Maryland Pink in [place].  ? LYMPHADENECTOMY Bilateral 04/30/2015  ? Procedure: PELVIC LYMPHADENECTOMY;  Surgeon: [name], MD;  [facility];  Service: Urology;  Laterality: Bilateral;  ? PROSTATE BIOPSY  02/05/2015  ? Prostate adenocarcinoma: pt considering treatment options as of 02/19/15  ? ROBOT ASSISTED LAPAROSCOPIC RADICAL PROSTATECTOMY N/A 04/30/2015  ? Procedure: XI ROBOTIC ASSISTED LAPAROSCOPIC RADICAL PROSTATECTOMY LEVEL 2;  Surgeon: [name], MD;  [facility];  Service: Urology;  Laterality: N/A;  ? TEE WITHOUT CARDIOVERSION N/A 05/12/2016  ? Procedure: TRANSESOPHAGEAL ECHOCARDIOGRAM (TEE);  Surgeon: [name], MD;  Location: [place];  Service: Open Heart Surgery;  Laterality: N/A;  ? TRANSTHORACIC ECHOCARDIOGRAM  03/22/2006; 05/2016; 07/2016; 07/2017; 01/02/20  ? 2008: EF 65%, normal valves, no wall motion abnormalties, LA size normal.  05/2016 in context of non-STEMI, EF 20-25%, mild AV and MV regurg.  08/05/16: EF 30-35%, akinesis of mid lateral myoc, grd II DD, mild aortic 07/2017: EF 25-30%, mult areas of akinesis, grd I DD. 01/2020 EF 25-30%, sev LV dsfxn, valves fine, aortic root 26m  ? ? ?Current Outpatient Medications  ?Medication Sig Dispense Refill  ? ALPRAZolam (XANAX) 0.25 MG tablet tAKE 1-2 tabLETS BY MOUTH AT BEDTIME AS NEEDED FOR insomnia/restless legs 60 tablet 5  ? aspirin EC 81 MG tablet Take 1 tablet (81 mg total) by mouth daily. Swallow whole. 90 tablet 3  ? benazepril (LOTENSIN) 20 MG tablet Take 1 tablet (20 mg total) by mouth daily. 1 tab po bid 180 tablet 3  ? bisoprolol (ZEBETA) 5 MG tablet Take 1 tablet (5 mg  total) by mouth daily. (Patient taking differently: Take 5 mg by mouth as needed. When BP is higher than 140) 30 tablet 0  ? hydrochlorothiazide (HYDRODIURIL) 25 MG tablet Take 1 tablet (25 mg total) by mouth daily. 30 tablet 0  ? Omega-3 Fatty Acids (FISH OIL) 1000 MG CAPS Take 1,000 mg by mouth daily.     ? ondansetron (ZOFRAN) 4 MG tablet Take 1 tablet (4 mg total) by mouth every 8 (eight) hours as needed for nausea or vomiting. 20 tablet 1  ? Polyethyl Glycol-Propyl Glycol (LUBRICANT EYE DROPS) 0.4-0.3 % SOLN Place 1 drop into both eyes 3 (three) times daily as needed (dry/irritated eyes.).    ? Vitamin D, Cholecalciferol, 400 units CAPS Take 400 Units by mouth daily.     ? ?No current facility-administered medications for this visit.  ? ? ?Allergies:   Entresto [sacubitril-valsartan], Amlodipine, Contrast media [iodinated contrast media], and Hydralazine  ? ?Social History: ?Social History  ? ?Socioeconomic History  ? Marital status: Married  ?  Spouse name: Not on file  ? Number of children: Not on file  ? Years of education: Not on file  ? Highest education level: Not on file  ?Occupational History  ? Not on file  ?Tobacco Use  ? Smoking status: Some Days  ?  Packs/day: 1.00  ?  Years: 55.00  ?  Pack years: 55.00  ?  Types: Cigarettes  ? Smokeless tobacco: Never  ? Tobacco comments:  ?  quit in 2017  ?Vaping Use  ? Vaping Use: Never used  ?Substance and Sexual Activity  ? Alcohol use: No  ? Drug use: No  ? Sexual activity: Not on file  ?Other Topics Concern  ? Not on file  ?Social History Narrative  ? Married, 2 grown children.  ? HS education.  Orig from [place], lives there now.  ? Occupation: maintenance.  Drives a tractor a lot, drives a pickup truck a lot, rides horses a lot.  ? Tobacco: 20 pack-yr hx (current as of 07/2012)  ? No drugs.  ? Alcohol: none  ? ?Social Determinants of Health  ? ?Financial Resource Strain: Not on file  ?Food Insecurity: Not on file  ?Transportation Needs: Not on file   ?Physical Activity: Not on file  ?Stress: Not on file  ?Social Connections: Not on file  ?Intimate Partner Violence: Not on file  ? ? ?Family History: ?Family History  ?Problem Relation Age of Onset  ? Hypertension Mother   ?

## 2021-05-31 NOTE — Addendum Note (Signed)
Addended by: Janan Halter F on: 05/31/2021 03:26 PM ? ? Modules accepted: Orders ? ?

## 2021-06-17 ENCOUNTER — Encounter: Payer: Self-pay | Admitting: Family Medicine

## 2021-06-17 ENCOUNTER — Ambulatory Visit (INDEPENDENT_AMBULATORY_CARE_PROVIDER_SITE_OTHER): Payer: Medicare Other | Admitting: Family Medicine

## 2021-06-17 ENCOUNTER — Telehealth: Payer: Self-pay

## 2021-06-17 VITALS — BP 168/84 | HR 70 | Temp 97.8°F | Ht 70.0 in | Wt 153.0 lb

## 2021-06-17 DIAGNOSIS — Z8546 Personal history of malignant neoplasm of prostate: Secondary | ICD-10-CM

## 2021-06-17 DIAGNOSIS — F17200 Nicotine dependence, unspecified, uncomplicated: Secondary | ICD-10-CM

## 2021-06-17 DIAGNOSIS — I10 Essential (primary) hypertension: Secondary | ICD-10-CM

## 2021-06-17 NOTE — Addendum Note (Signed)
Addended by: Deveron Furlong D on: 06/17/2021 04:48 PM   Modules accepted: Orders

## 2021-06-17 NOTE — Progress Notes (Signed)
OFFICE VISIT  06/17/2021  CC:  Chief Complaint  Patient presents with   Hypertension   HPI:    Patient is a 76 y.o. male who presents for 3 mo f/u HTN and tobacco dependence. A/P as of last visit: "1) Hypertension, uncontrolled. Intolerant of clonidine. Several allergies to antihypertensives. Review of old med list shows HCTZ but no mention of any allergy and patient does not recall why he is not still on this. I chose to restart HCTZ 25 mg today.  Continue benazepril 20 mg twice a day.   #2 peripheral arterial disease.  Recent abdominal aortic ultrasound showed 4 cm distal AAA abdominal aortic aneurysm.  Also ectatic right common iliac artery. Discussed need for referral to vascular surgery for monitoring, expert evaluation, and guidance. He was certainly in agreement with this today.  Continue daily aspirin.  Continue to work on blood pressure control.  He smokes cigarettes "just a little "and I encouraged him to completely stop. History of statin intolerance.  He has declined further medication trial but will bring this up again at next visit."  INTERIM HX: He feels great. Spends a lot of time remodeling old cars and trucks.  He works around his property all day as well. He monitors his blood pressure and says it is typically 140s over 80s in the morning.  He takes his 20 mg benazepril tab at that time.  He checks his blood pressure later in the evening as well and says sometimes when it is down in the 120s over 70s he feels lightheaded and "washed out".  This makes him not take his blood pressure med that evening.  He does occasionally take his benazepril twice a day. He does not take the bisoprolol or HCTZ currently on his med list due to past problems with lower blood pressure/low blood pressure symptoms.  He typically smokes anywhere from 2 to 5 cigarettes a day. He knows he needs to quit completely.  He has a history of prostate cancer diagnosed in 2017, has had prostatectomy  and pelvic lymphadenectomy.  No adjuvant treatment, patient chose PSA surveillance. PSAs were undetectable long-term until 02/04/2021 when it came back 0.1.  ROS as above, plus--> no fevers, no CP, no SOB, no wheezing, no cough, no dizziness, no HAs, no rashes, no melena/hematochezia.  No polyuria or polydipsia.  No myalgias or arthralgias.  No focal weakness, paresthesias, or tremors.  No acute vision or hearing abnormalities.  No dysuria or unusual/new urinary urgency or frequency.  No recent changes in lower legs. No n/v/d or abd pain.  No palpitations.    Past Medical History:  Diagnosis Date   Abdominal aortic aneurysm ([place]) 01/2021   4 cm, infrarenal.  Also enlarged left common iliac artery--vascular eval 03/2021->recommended u/s rpt 1 yr.   Anxiety    Atrial fibrillation ([place])    Limited episode, emergency room, June, 2013, spontaneous conversion to sinus rhythm, was evaluated By [name] 2013- no further visits.  Post-op (CABG) a-fib, converted on amio but pt self d/c'd this med due to DOE/side effect profile.   CAD (coronary artery disease) 05/2016   a. 05/2016: NSTEMI with cath showing 3-vessel disease. Underwent CABG on 05/12/16 with LIMA-D1, SVG-LAD, and SVG-PDA.    COPD (chronic obstructive pulmonary disease) ([place]) fall 2016   Bullous changes noted on lower lung images of CT abd done by urology   Diverticulitis    Double vision 01/2020   MG ab w/u NEG.  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 Unable to have MRI due to having shrapnel in forehead.   Hyperlipidemia    statin intolerant. Pt declined advanced lipid clinic referral 08/2019, 03/2020, and 01/2021.    Hypertension    Ischemic cardiomyopathy 05/2016   EF 20-25% at the time of NSTEMI/CABG.  Repeat EF 07/2016 30-35%.  07/2017 EF 25-30%, pt intol of entresto, bidil, and BB--for ICD 03/2019.   Microscopic hematuria Fall 2016   CT nl except nonobstructing stones.  Cystoscopy normal 12/30/14 (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  Urol ([name]) at [facility]; [name] referred him to [name] 03/2015: patient got bilat nerve sparing , robot assisted laparoscopic radical prostatectomy and pelvic lymphadenectomy.  Urol f/u, PSA undetectable on repeat 04/22/16 and 10/28/16; pt chose PSA surveill over adjuv rad tx. PSA undetectable as of 2021   RLS (restless legs syndrome)    Tobacco dependence    down to 1-2 cigs per day as of 11/2015.  Restarted 08/2016.   Urinary retention 05/2015   occurred s/p foley removal    Past Surgical History:  Procedure Laterality Date   aortic ultrasound  07/2012   2014 NO AAA.  01/2021-->4 cm AAA with R common iliac ectasia-->vasc surg ref   COLONOSCOPY  approx 2011 per pt   Done by surgeon in [place] after a bout of diverticulitis; normal per pt report--was told to repeat in 10 yrs.   CORONARY ARTERY BYPASS GRAFT N/A 05/12/2016   Procedure: CORONARY ARTERY BYPASS GRAFTING (CABG) TIMES THREE USING LEFT INTERNAL MAMMARY ARTERY AND RIGHT SAPHENOUS LEG VEIN HARVESTED ENDOSCOPICALLY;  Surgeon: [name], MD;  Location: [facility];  Service: Open Heart Surgery;  Laterality: N/A;   ICD IMPLANT N/A 04/12/2019   Procedure: ICD IMPLANT;  Surgeon: Evans Lance, MD;  Location: [facility];  Service: Cardiovascular;  Laterality: N/A;   INGUINAL HERNIA REPAIR  2003 and 2004   Both sides.   LAPAROSCOPY  2017   LEFT HEART CATH AND CORONARY  ANGIOGRAPHY N/A 05/09/2016   Procedure: Left Heart Cath and Coronary Angiography;  Surgeon: [name], MD;  Location: [facility];  Service: Cardiovascular;  Laterality: N/A;   LITHOTRIPSY  2004   Dr. [place] Pink in [place].   LYMPHADENECTOMY Bilateral 04/30/2015   Procedure: PELVIC LYMPHADENECTOMY;  Surgeon: [name], MD;  Location: [facility];  Service: Urology;  Laterality: Bilateral;   PROSTATE BIOPSY  02/05/2015   Prostate adenocarcinoma: pt considering treatment options as of 02/19/15   ROBOT ASSISTED LAPAROSCOPIC RADICAL PROSTATECTOMY N/A 04/30/2015   Procedure: XI ROBOTIC ASSISTED LAPAROSCOPIC RADICAL PROSTATECTOMY LEVEL 2;  Surgeon: [name], MD;  Location: [facility];  Service: Urology;  Laterality: N/A;   TEE WITHOUT CARDIOVERSION N/A 05/12/2016   Procedure: TRANSESOPHAGEAL ECHOCARDIOGRAM (TEE);  Surgeon: [name], MD;  Location: [facility];  Service: Open Heart Surgery;  Laterality: N/A;   TRANSTHORACIC ECHOCARDIOGRAM  03/22/2006; 05/2016; 07/2016;  07/2017; 01/02/20   2008: EF 65%, normal valves, no wall motion abnormalties, LA size normal.  05/2016 in context of non-STEMI, EF 20-25%, mild AV and MV regurg.  08/05/16: EF 30-35%, akinesis of mid lateral myoc, grd II DD, mild aortic 07/2017: EF 25-30%, mult areas of akinesis, grd I DD. 01/2020 EF 25-30%, sev LV dsfxn, valves fine, aortic root 58m    Outpatient Medications Prior to Visit  Medication Sig Dispense Refill   ALPRAZolam (XANAX) 0.25 MG tablet tAKE 1-2 tabLETS BY MOUTH AT BEDTIME AS NEEDED FOR insomnia/restless legs 60 tablet 5   aspirin EC 81 MG tablet Take 1 tablet (81 mg total) by mouth daily. Swallow whole. 90 tablet 3   benazepril (LOTENSIN) 20 MG tablet Take 1 tablet (20 mg total) by mouth daily. 1 tab po bid 180 tablet 3   bisoprolol (ZEBETA) 5 MG tablet Take 1 tablet (5 mg total) by mouth daily. (Patient taking differently: Take 5 mg by mouth as needed. When BP is higher than 140) 30 tablet 0   Omega-3 Fatty  Acids (FISH OIL) 1000 MG CAPS Take 1,000 mg by mouth daily.      Polyethyl Glycol-Propyl Glycol (LUBRICANT EYE DROPS) 0.4-0.3 % SOLN Place 1 drop into both eyes 3 (three) times daily as needed (dry/irritated eyes.).     Vitamin D, Cholecalciferol, 400 units CAPS Take 400 Units by mouth daily.      hydrochlorothiazide (HYDRODIURIL) 25 MG tablet Take 1 tablet (25 mg total) by mouth daily. (Patient not taking: Reported on 06/17/2021) 30 tablet 0   ondansetron (ZOFRAN) 4 MG tablet Take 1 tablet (4 mg total) by mouth every 8 (eight) hours as needed for nausea or vomiting. (Patient not taking: Reported on 06/17/2021) 20 tablet 1   No facility-administered medications prior to visit.    Allergies  Allergen Reactions   Entresto [Sacubitril-Valsartan] Hives and Shortness Of Breath   Amlodipine Swelling    LE swelling   Contrast Media [Iodinated Contrast Media] Other (See Comments)    Prostate problem had to wear a catheter for two weeks after having dye.    Hydralazine Palpitations and Other (See Comments)    Fatigue+    ROS As per HPI  PE:    06/17/2021   11:14 AM 05/28/2021   11:15 AM 03/18/2021    3:54 PM  Vitals with BMI  Height '5\' 10"'$  '5\' 10"'$  '5\' 9"'$   Weight 153 lbs 152 lbs 10 oz 152 lbs 13 oz  BMI 21.95 [phone] Systolic [id number] Diastolic 84 98 74  Pulse 70 74 70  Manual bp repeat today is 140/84   Physical Exam  Gen: Alert, well appearing.  Patient is oriented to person, place, time, and situation. AFFECT: pleasant, lucid thought and speech. CV: RRR, no m/r/g.   LUNGS: CTA bilat, nonlabored resps, good aeration in all lung fields. EXT: 1-2 + R LL pitting edema.  Trace L LL pitting edema.  LABS:  Last CBC Lab Results  Component Value Date   WBC 4.7 10/22/2020   HGB 14.1 10/22/2020   HCT 42.0 10/22/2020   MCV 93.6 10/22/2020   MCH 31.1 10/01/2020   RDW 14.4 10/22/2020   PLT 134.0 (L) 014/78/2956  Last metabolic panel Lab Results  Component Value Date    GLUCOSE 86 02/04/2021   NA 136 02/04/2021   K 4.8 02/04/2021   CL 103 02/04/2021   CO2 28 02/04/2021   BUN 18 02/04/2021   CREATININE 1.00  02/04/2021   GFRNONAA >60 10/01/2020   CALCIUM 9.6 02/04/2021   PROT 6.4 02/04/2021   ALBUMIN 4.2 02/04/2021   LABGLOB 2.2 09/16/2016   AGRATIO 2.0 09/16/2016   BILITOT 0.8 02/04/2021   ALKPHOS 74 02/04/2021   AST 18 02/04/2021   ALT 10 02/04/2021   ANIONGAP 7 10/01/2020   Lab Results  Component Value Date   PSA 0.10 02/04/2021   PSA 0.07 (L) 09/29/2020   PSA 0.047 12/11/2019   IMPRESSION AND PLAN:  #1 hypertension.  Not ideal control but patient is only comfortable currently taking benazepril 20 mg 1-2 times a day. Basic metabolic panel today.  2.  Tobacco dependence.  He knows he needs to quit completely.  Encouraged him today.  3.  History of prostate cancer. Surveillance PSAs consistently undetectable for years. PSA barely detectable at 0.1 in January this year. He has follow-up with [name] in the next 1 to 2 weeks but patient requesting PSA here today so I will do this and forward result to Dr. Lynne Logan office.  An After Visit Summary was printed and given to the patient.  FOLLOW UP: Return in about 5 months (around 11/17/2021) for routine chronic illness f/u. Next CPE 01/2022  Signed:  Crissie Sickles, MD           06/17/2021

## 2021-06-17 NOTE — Telephone Encounter (Signed)
Unfortunately lab will not be able to process blood samples from earlier today. Pt will need to have labs recollected. LM for pt to CB. Labs re-ordered

## 2021-06-18 ENCOUNTER — Telehealth: Payer: Self-pay

## 2021-06-18 NOTE — Telephone Encounter (Signed)
Spoke with pt's wife, Vaughan Basta and advised unfortunately labs from yesterday (5/18) will need to be recollected. Pt needs to schedule lab visit. She will inform pt

## 2021-06-18 NOTE — Telephone Encounter (Signed)
Patient returning call regarding lab results.   Patient is working in yard and fields today.  He will call back later

## 2021-06-22 LAB — BASIC METABOLIC PANEL
BUN: 19 mg/dL (ref 6–23)
CO2: 28 mEq/L (ref 19–32)
Calcium: 9.9 mg/dL (ref 8.4–10.5)
Chloride: 102 mEq/L (ref 96–112)
Creatinine, Ser: 1.05 mg/dL (ref 0.40–1.50)
GFR: 69.13 mL/min (ref >60.00)
Glucose, Bld: 94 mg/dL (ref 70–99)
Potassium: 4.3 mEq/L (ref 3.5–5.1)
Sodium: 138 mEq/L (ref 135–145)

## 2021-06-22 LAB — PSA, MEDICARE: PSA: 0.15 ng/ml (ref 0.10–4.00)

## 2021-07-09 ENCOUNTER — Ambulatory Visit (INDEPENDENT_AMBULATORY_CARE_PROVIDER_SITE_OTHER): Payer: Medicare Other

## 2021-07-09 DIAGNOSIS — I255 Ischemic cardiomyopathy: Secondary | ICD-10-CM

## 2021-07-09 LAB — CUP PACEART REMOTE DEVICE CHECK
Battery Remaining Longevity: 70 mo
Battery Remaining Percentage: 67 %
Battery Voltage: 2.96 V
Brady Statistic AP VP Percent: 47 %
Brady Statistic AP VS Percent: 47 %
Brady Statistic AS VP Percent: 1 %
Brady Statistic AS VS Percent: 5.7 %
Brady Statistic RA Percent Paced: 93 %
Brady Statistic RV Percent Paced: 47 %
Date Time Interrogation Session: 20230609030238
HighPow Impedance: 65 Ohm
Implantable Lead Implant Date: 20210312
Implantable Lead Implant Date: 20210312
Implantable Lead Location: 753859
Implantable Lead Location: 753860
Implantable Pulse Generator Implant Date: 20210312
Lead Channel Impedance Value: 430 Ohm
Lead Channel Impedance Value: 460 Ohm
Lead Channel Pacing Threshold Amplitude: 0.375 V
Lead Channel Pacing Threshold Amplitude: 0.875 V
Lead Channel Pacing Threshold Pulse Width: 0.5 ms
Lead Channel Pacing Threshold Pulse Width: 0.5 ms
Lead Channel Sensing Intrinsic Amplitude: 11.8 mV
Lead Channel Sensing Intrinsic Amplitude: 2.1 mV
Lead Channel Setting Pacing Amplitude: 1.125
Lead Channel Setting Pacing Amplitude: 1.375
Lead Channel Setting Pacing Pulse Width: 0.5 ms
Lead Channel Setting Sensing Sensitivity: 0.5 mV
Pulse Gen Serial Number: 111016652

## 2021-07-15 NOTE — Progress Notes (Signed)
Remote ICD transmission.   

## 2021-07-29 ENCOUNTER — Encounter: Payer: Self-pay | Admitting: Family Medicine

## 2021-09-29 ENCOUNTER — Ambulatory Visit (INDEPENDENT_AMBULATORY_CARE_PROVIDER_SITE_OTHER): Payer: Medicare Other

## 2021-09-29 ENCOUNTER — Ambulatory Visit: Payer: Medicare Other

## 2021-09-29 DIAGNOSIS — Z0000 Encounter for general adult medical examination without abnormal findings: Secondary | ICD-10-CM | POA: Diagnosis not present

## 2021-09-29 NOTE — Patient Instructions (Signed)
Health Maintenance, Male Adopting a healthy lifestyle and getting preventive care are important in promoting health and wellness. Ask your health care provider about: The right schedule for you to have regular tests and exams. Things you can do on your own to prevent diseases and keep yourself healthy. What should I know about diet, weight, and exercise? Eat a healthy diet  Eat a diet that includes plenty of vegetables, fruits, low-fat dairy products, and lean protein. Do not eat a lot of foods that are high in solid fats, added sugars, or sodium. Maintain a healthy weight Body mass index (BMI) is a measurement that can be used to identify possible weight problems. It estimates body fat based on height and weight. Your health care provider can help determine your BMI and help you achieve or maintain a healthy weight. Get regular exercise Get regular exercise. This is one of the most important things you can do for your health. Most adults should: Exercise for at least 150 minutes each week. The exercise should increase your heart rate and make you sweat (moderate-intensity exercise). Do strengthening exercises at least twice a week. This is in addition to the moderate-intensity exercise. Spend less time sitting. Even light physical activity can be beneficial. Watch cholesterol and blood lipids Have your blood tested for lipids and cholesterol at 76 years of age, then have this test every 5 years. You may need to have your cholesterol levels checked more often if: Your lipid or cholesterol levels are high. You are older than 76 years of age. You are at high risk for heart disease. What should I know about cancer screening? Many types of cancers can be detected early and may often be prevented. Depending on your health history and family history, you may need to have cancer screening at various ages. This may include screening for: Colorectal cancer. Prostate cancer. Skin cancer. Lung  cancer. What should I know about heart disease, diabetes, and high blood pressure? Blood pressure and heart disease High blood pressure causes heart disease and increases the risk of stroke. This is more likely to develop in people who have high blood pressure readings or are overweight. Talk with your health care provider about your target blood pressure readings. Have your blood pressure checked: Every 3-5 years if you are 18-39 years of age. Every year if you are 40 years old or older. If you are between the ages of 65 and 75 and are a current or former smoker, ask your health care provider if you should have a one-time screening for abdominal aortic aneurysm (AAA). Diabetes Have regular diabetes screenings. This checks your fasting blood sugar level. Have the screening done: Once every three years after age 45 if you are at a normal weight and have a low risk for diabetes. More often and at a younger age if you are overweight or have a high risk for diabetes. What should I know about preventing infection? Hepatitis B If you have a higher risk for hepatitis B, you should be screened for this virus. Talk with your health care provider to find out if you are at risk for hepatitis B infection. Hepatitis C Blood testing is recommended for: Everyone born from 1945 through 1965. Anyone with known risk factors for hepatitis C. Sexually transmitted infections (STIs) You should be screened each year for STIs, including gonorrhea and chlamydia, if: You are sexually active and are younger than 76 years of age. You are older than 76 years of age and your   health care provider tells you that you are at risk for this type of infection. Your sexual activity has changed since you were last screened, and you are at increased risk for chlamydia or gonorrhea. Ask your health care provider if you are at risk. Ask your health care provider about whether you are at high risk for HIV. Your health care provider  may recommend a prescription medicine to help prevent HIV infection. If you choose to take medicine to prevent HIV, you should first get tested for HIV. You should then be tested every 3 months for as long as you are taking the medicine. Follow these instructions at home: Alcohol use Do not drink alcohol if your health care provider tells you not to drink. If you drink alcohol: Limit how much you have to 0-2 drinks a day. Know how much alcohol is in your drink. In the [place] one drink equals one 12 oz bottle of beer (355 mL), one 5 oz glass of wine (148 mL), or one 1 oz glass of hard liquor (44 mL). Lifestyle Do not use any products that contain nicotine or tobacco. These products include cigarettes, chewing tobacco, and vaping devices, such as e-cigarettes. If you need help quitting, ask your health care provider. Do not use street drugs. Do not share needles. Ask your health care provider for help if you need support or information about quitting drugs. General instructions Schedule regular health, dental, and eye exams. Stay current with your vaccines. Tell your health care provider if: You often feel depressed. You have ever been abused or do not feel safe at home. Summary Adopting a healthy lifestyle and getting preventive care are important in promoting health and wellness. Follow your health care provider's instructions about healthy diet, exercising, and getting tested or screened for diseases. Follow your health care provider's instructions on monitoring your cholesterol and blood pressure. This information is not intended to replace advice given to you by your health care provider. Make sure you discuss any questions you have with your health care provider. Document Revised: 06/08/2020 Document Reviewed: 06/08/2020 Elsevier Patient Education  2023 [employer]  

## 2021-09-29 NOTE — Progress Notes (Signed)
Subjective:   Daniel [name]. is a 76 y.o. male who presents for Medicare Annual/Subsequent preventive examination.  I connected with  Daniel [name]. on 09/29/21 by an audio only telemedicine application and verified that I am speaking with the correct person using two identifiers.   I discussed the limitations, risks, security and privacy concerns of performing an evaluation and management service by telephone and the availability of in person appointments. I also discussed with the patient that there may be a patient responsible charge related to this service. The patient expressed understanding and verbally consented to this telephonic visit.  Location of Patient: home Location of Provider: office  List any persons and their role that are participating in the visit with the patient.   [place], CMA  Review of Systems    Defer to PCP Cardiac Risk Factors include: advanced age (>34mn, >>42women);dyslipidemia;hypertension;male gender     Objective:    There were no vitals filed for this visit. There is no height or weight on file to calculate BMI.     09/29/2021    1:52 PM 10/01/2020    4:43 PM 01/03/2020   10:00 AM 04/12/2019   10:06 AM 11/20/2016    9:51 PM 09/27/2016    8:30 AM 05/09/2016    8:22 PM  Advanced Directives  Does Patient Have a Medical Advance Directive? No No No Yes No No No  Type of Advance Directive    Healthcare Power of Attorney     Would patient like information on creating a medical advance directive?   No - Patient declined  No - Patient declined No - Patient declined No - Patient declined    Current Medications (verified) Outpatient Encounter Medications as of 09/29/2021  Medication Sig   ALPRAZolam (XANAX) 0.25 MG tablet tAKE 1-2 tabLETS BY MOUTH AT BEDTIME AS NEEDED FOR insomnia/restless legs   aspirin EC 81 MG tablet Take 1 tablet (81 mg total) by mouth daily. Swallow whole.   benazepril (LOTENSIN) 20 MG tablet Take 1 tablet  (20 mg total) by mouth daily. 1 tab po bid   bisoprolol (ZEBETA) 5 MG tablet Take 1 tablet (5 mg total) by mouth daily. (Patient taking differently: Take 5 mg by mouth as needed. When BP is higher than 140)   hydrochlorothiazide (HYDRODIURIL) 25 MG tablet Take 1 tablet (25 mg total) by mouth daily. (Patient not taking: Reported on 06/17/2021)   Omega-3 Fatty Acids (FISH OIL) 1000 MG CAPS Take 1,000 mg by mouth daily.    ondansetron (ZOFRAN) 4 MG tablet Take 1 tablet (4 mg total) by mouth every 8 (eight) hours as needed for nausea or vomiting. (Patient not taking: Reported on 06/17/2021)   Polyethyl Glycol-Propyl Glycol (LUBRICANT EYE DROPS) 0.4-0.3 % SOLN Place 1 drop into both eyes 3 (three) times daily as needed (dry/irritated eyes.).   Vitamin D, Cholecalciferol, 400 units CAPS Take 400 Units by mouth daily.    [DISCONTINUED] Cetirizine HCl (ZYRTEC ALLERGY) 10 MG CAPS Take 1 capsule (10 mg total) by mouth daily.   No facility-administered encounter medications on file as of 09/29/2021.    Allergies (verified) Entresto [sacubitril-valsartan], Amlodipine, Contrast media [iodinated contrast media], and Hydralazine   History: Past Medical History:  Diagnosis Date   Abdominal aortic aneurysm ([facility] [facility] 01/2021   4 cm, infrarenal.  Also enlarged left common iliac artery--vascular eval 03/2021->recommended u/s rpt 1 yr.   Anxiety    [place]rial [place] ([place]    Limited  episode, emergency room, June, 2013, spontaneous conversion to sinus rhythm, was evaluated By [name] 2013- no further visits.  Post-op (CABG) a-fib, converted on amio but pt self d/c'd this med due to DOE/side effect profile.   CAD (coronary artery disease) 05/2016   a. 05/2016: NSTEMI with cath showing 3-vessel disease. Underwent CABG on 05/12/16 with LIMA-D1, SVG-LAD, and SVG-PDA.    COPD (chronic obstructive pulmonary disease) ([place]) fall 2016   Bullous changes noted on lower lung images of CT abd done by urology   Diverticulitis     Double vision 01/2020   MG ab w/u NEG.  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 Unable to have MRI due to having shrapnel in forehead.   Hyperlipidemia    statin intolerant. Pt declined advanced lipid clinic referral 08/2019, 03/2020, and 01/2021.   Hypertension    Ischemic cardiomyopathy 05/2016   EF 20-25% at the time of NSTEMI/CABG.  Repeat EF 07/2016 30-35%.  07/2017 EF 25-30%, pt intol of entresto, bidil, and BB--for ICD 03/2019.   Microscopic hematuria Fall 2016   CT nl except nonobstructing stones.  Cystoscopy normal 12/30/14 (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  Urol ([name]) [facility]; [name] referred him to [name] 03/2015: patient got bilat nerve sparing , robot assisted laparoscopic radical prostatectomy and pelvic lymphadenectomy.  Urol f/u, PSA surveillance showing very mild uptrend of PSA but not to the level of biochemical recurrence as of 05/2021 urology follow-up   RLS (restless legs syndrome)    Tobacco dependence    down to 1-2 cigs per day as of 11/2015.  Restarted 08/2016.   Urinary retention 05/2015   occurred s/p foley removal   Past Surgical History:  Procedure Laterality Date   aortic ultrasound  07/2012   2014 NO AAA.  01/2021-->4 cm AAA with R common iliac ectasia-->vasc surg ref   COLONOSCOPY  approx 2011 per pt   Done by surgeon in [place] after a bout of diverticulitis; normal per pt report--was told to repeat in  10 yrs.   CORONARY ARTERY BYPASS GRAFT N/A 05/12/2016   Procedure: CORONARY ARTERY BYPASS GRAFTING (CABG) TIMES THREE USING LEFT INTERNAL MAMMARY ARTERY AND RIGHT SAPHENOUS LEG VEIN HARVESTED ENDOSCOPICALLY;  Surgeon: [name], MD;  Location: [facility];  Service: Open Heart Surgery;  Laterality: N/A;   ICD IMPLANT N/A 04/12/2019   Procedure: ICD IMPLANT;  Surgeon: Evans Lance, MD;  Location: [facility];  Service: Cardiovascular;  Laterality: N/A;   INGUINAL HERNIA REPAIR  2003 and 2004   Both sides.   LAPAROSCOPY  2017   LEFT HEART CATH AND CORONARY ANGIOGRAPHY N/A 05/09/2016   Procedure: Left Heart Cath and Coronary Angiography;  Surgeon: [name], MD;  Location: [facility];  Service: Cardiovascular;  Laterality: N/A;   LITHOTRIPSY  2004   Dr. [place] Pink in [place].   LYMPHADENECTOMY Bilateral 04/30/2015  Procedure: PELVIC LYMPHADENECTOMY;  Surgeon: [name], MD;  Location: [facility];  Service: Urology;  Laterality: Bilateral;   PROSTATE BIOPSY  02/05/2015   Prostate adenocarcinoma: pt considering treatment options as of 02/19/15   ROBOT ASSISTED LAPAROSCOPIC RADICAL PROSTATECTOMY N/A 04/30/2015   Procedure: XI ROBOTIC ASSISTED LAPAROSCOPIC RADICAL PROSTATECTOMY LEVEL 2;  Surgeon: [name], MD;  Location: [facility];  Service: Urology;  Laterality: N/A;   TEE WITHOUT CARDIOVERSION N/A 05/12/2016   Procedure: TRANSESOPHAGEAL ECHOCARDIOGRAM (TEE);  Surgeon: [name], MD;  Location: [facility];  Service: Open Heart Surgery;  Laterality: N/A;   TRANSTHORACIC ECHOCARDIOGRAM  03/22/2006; 05/2016; 07/2016; 07/2017; 01/02/20   2008: EF 65%, normal valves, no wall motion abnormalties, LA size normal.  05/2016 in context of non-STEMI, EF 20-25%, mild AV and MV regurg.  08/05/16: EF 30-35%, akinesis of mid lateral myoc, grd II DD, mild aortic 07/2017: EF 25-30%, mult areas of akinesis, grd I DD. 01/2020 EF 25-30%, sev LV dsfxn, valves fine, aortic root 60m   Family History   Problem Relation Age of Onset   Hypertension Mother    Cancer - Other Mother 716      breast   Cancer Father        Lung ca age 234  Diabetes Sister    Social History   Socioeconomic History   Marital status: Married    Spouse name: Not on file   Number of children: Not on file   Years of education: Not on file   Highest education level: Not on file  Occupational History   Not on file  Tobacco Use   Smoking status: Some Days    Packs/day: 1.00    Years: 55.00    Total pack years: 55.00    Types: Cigarettes   Smokeless tobacco: Never   Tobacco comments:    quit in 2017  Vaping Use   Vaping Use: Never used  Substance and Sexual Activity   Alcohol use: No   Drug use: No   Sexual activity: Not on file  Other Topics Concern   Not on file  Social History Narrative   Married, 2 grown children.   [place] education.  [place]   Occupation: maintenance.  Drives a tractor a lot, drives a pickup truck a lot, rides horses a lot.   Tobacco: 20 pack-yr hx (current as of 07/2012)   No drugs.   Alcohol: none   Social Determinants of Health   Financial Resource Strain: Low Risk  (09/29/2021)   Overall Financial Resource Strain (CARDIA)    Difficulty of Paying Living Expenses: Not hard at all  Food Insecurity: No Food Insecurity (09/29/2021)   Hunger Vital Sign    Worried About Running Out of Food in the Last Year: Never true    Ran Out of Food in the Last Year: Never true  Transportation Needs: No Transportation Needs (09/29/2021)   PRAPARE - THydrologist(Medical): No    Lack of Transportation (Non-Medical): No  Physical Activity: Inactive (09/29/2021)   Exercise Vital Sign    Days of Exercise per Week: 0 days    Minutes of Exercise per Session: 0 min  Stress: No Stress Concern Present (09/29/2021)   [place]   Feeling of Stress : Not at all  Social  Connections: Moderately Integrated (09/29/2021)   Social Connection and Isolation Panel [NHANES]    Frequency  of Communication with Friends and Family: More than three times a week    Frequency of Social Gatherings with Friends and Family: More than three times a week    Attends Religious Services: More than 4 times per year    Active Member of [employer] or Organizations: No    Attends Archivist Meetings: Never    Marital Status: Married    Tobacco Counseling Ready to quit: Not Answered Counseling given: Not Answered Tobacco comments: quit in 2017   Clinical Intake:  Pre-visit preparation completed: Yes  Pain : No/denies pain     Diabetes: No  How often do you need to have someone help you when you read instructions, pamphlets, or other written materials from your doctor or pharmacy?: 1 - Never  Diabetic?No  Interpreter Needed?: No      Activities of Daily Living    09/29/2021    1:51 PM  In your present state of health, do you have any difficulty performing the following activities:  Hearing? 1  Vision? 0  Difficulty concentrating or making decisions? 0  Walking or climbing stairs? 0  Dressing or bathing? 0  Doing errands, shopping? 0  Preparing Food and eating ? N  Using the Toilet? N  In the past six months, have you accidently leaked urine? N  Do you have problems with loss of bowel control? N  Managing your Medications? N  Managing your Finances? N  Housekeeping or managing your Housekeeping? N    Patient Care Team: [name], MD as PCP - General (Family Medicine) [name], MD as PCP - Cardiology (Cardiology) Evans Lance, MD as PCP - Electrophysiology (Cardiology) Carlena Bjornstad, MD as Consulting Physician (Cardiology) Sadie Haber, MD as Consulting Physician (Urology) [name], MD as Consulting Physician (Urology) [name], MD as Consulting Physician (Cardiothoracic Surgery) [name], MD as  Consulting Physician (Cardiology) Almyra Deforest, [place] as Consulting Physician (Cardiology) [name], MD as Consulting Physician (Cardiology) Evans Lance, MD as Consulting Physician (Cardiology) Waynetta Sandy, MD as Consulting Physician (Vascular Surgery)  Indicate any recent Medical Services you may have received from other than [facility] providers in the past year (date may be approximate).     Assessment:   This is a routine wellness examination for Jerid.  Hearing/Vision screen No results found.  Dietary issues and exercise activities discussed: Current Exercise Habits: The patient has a physically strenuous job, but has no regular exercise apart from work., Time (Minutes): 60, Frequency (Times/Week): 7, Weekly Exercise (Minutes/Week): 420   Goals Addressed   None   Depression Screen    09/29/2021    1:50 PM 06/17/2021   11:21 AM 02/04/2021   10:05 AM 09/29/2020    9:27 AM 08/08/2019    9:50 AM 07/27/2018    9:20 AM 07/12/2017    8:37 AM  PHQ 2/9 Scores  PHQ - 2 Score 0 0 0 0 0 0 0  PHQ- 9 Score       1    Fall Risk    09/29/2021    1:50 PM 02/04/2021   10:05 AM 09/29/2020    9:27 AM 08/08/2019    9:50 AM 07/27/2018    9:20 AM  [place] in the past year? 0 0 0 0 0  Number falls in past yr: 0 0 0 0 0  Injury with Fall? 0 0 0 0 0  Risk for fall due to : No Fall  Risks      Follow up Falls evaluation completed Falls evaluation completed Falls evaluation completed Falls evaluation completed Falls evaluation completed    [place]:  Any stairs in or around the home? No  If so, are there any without handrails? No  Home free of loose throw rugs in walkways, pet beds, electrical cords, etc? Yes  Adequate lighting in your home to reduce risk of falls? Yes   ASSISTIVE DEVICES UTILIZED TO PREVENT FALLS:  Life alert? No  Use of a cane, walker or w/c? No  Grab bars in the bathroom? Yes  Shower chair or bench in shower? No   Elevated toilet seat or a handicapped toilet? Yes   TIMED UP AND GO:  Was the test performed? No .  Length of time to ambulate 10 feet: n/a sec.     Cognitive Function:        09/29/2021    1:55 PM  6CIT Screen  What Year? 0 points  What month? 0 points  What time? 0 points  Count back from 20 0 points  Repeat phrase 0 points    Immunizations There is no immunization history for the selected administration types on file for this patient.  TDAP status: Up to date  Flu Vaccine status: Due, Education has been provided regarding the importance of this vaccine. Advised may receive this vaccine at local pharmacy or Health Dept. Aware to provide a copy of the vaccination record if obtained from local pharmacy or Health Dept. Verbalized acceptance and understanding.  Pneumococcal vaccine status: Due, Education has been provided regarding the importance of this vaccine. Advised may receive this vaccine at local pharmacy or Health Dept. Aware to provide a copy of the vaccination record if obtained from local pharmacy or Health Dept. Verbalized acceptance and understanding.  Covid-19 vaccine status: Information provided on how to obtain vaccines.   Qualifies for Shingles Vaccine? Yes   Zostavax completed No   Shingrix Completed?: No.    Education has been provided regarding the importance of this vaccine. Patient has been advised to call insurance company to determine out of pocket expense if they have not yet received this vaccine. Advised may also receive vaccine at local pharmacy or Health Dept. Verbalized acceptance and understanding.  Screening Tests Health Maintenance  Topic Date Due   COVID-19 Vaccine (1) Never done   Pneumonia Vaccine 36+ Years old (1 - PCV) Never done   Zoster Vaccines- Shingrix (1 of 2) Never done   INFLUENZA VACCINE  08/31/2021   TETANUS/TDAP  02/16/2022 (Originally 02/03/2019)   Hepatitis C Screening  Completed   HPV VACCINES  Aged Out   COLONOSCOPY  (Pts 45-75yr Insurance coverage will need to be confirmed)  Discontinued    Health Maintenance  Health Maintenance Due  Topic Date Due   COVID-19 Vaccine (1) Never done   Pneumonia Vaccine 76 Years old (1 - PCV) Never done   Zoster Vaccines- Shingrix (1 of 2) Never done   INFLUENZA VACCINE  08/31/2021    Colorectal cancer screening: No longer required.   Lung Cancer Screening: (Low Dose CT Chest recommended if Age 76-80years, 30 pack-year currently smoking OR have quit w/in 15years.) does qualify.   Lung Cancer Screening Referral: n/a  Additional Screening:  Hepatitis C Screening: does qualify; Completed 03/30/2016  Vision Screening: Recommended annual ophthalmology exams for early detection of glaucoma and other disorders of the eye. Is the patient up to date with their annual  eye exam?  Yes  Who is the provider or what is the name of the office in which the patient attends annual eye exams? [facility] If pt is not established with a provider, would they like to be referred to a provider to establish care? No .   Dental Screening: Recommended annual dental exams for proper oral hygiene  Community Resource Referral / Chronic Care Management: CRR required this visit?  No   CCM required this visit?  No      Plan:     I have personally reviewed and noted the following in the patient's chart:   Medical and social history Use of alcohol, tobacco or illicit drugs  Current medications and supplements including opioid prescriptions. Patient is not currently taking opioid prescriptions. Functional ability and status Nutritional status Physical activity Advanced directives List of other physicians Hospitalizations, surgeries, and ER visits in previous 12 months Vitals Screenings to include cognitive, depression, and falls Referrals and appointments  In addition, I have reviewed and discussed with patient certain preventive protocols, quality  metrics, and best practice recommendations. A written personalized care plan for preventive services as well as general preventive health recommendations were provided to patient.     Octaviano Glow, CMA   09/29/2021   Nurse Notes: Non-Face to Face or Face to Face 10 minute visit Encounter    Mr. Imel , Thank you for taking time to come for your Medicare Wellness Visit. I appreciate your ongoing commitment to your health goals. Please review the following plan we discussed and let me know if I can assist you in the future.   These are the goals we discussed:  Goals       patient (pt-stated)      Maintain current health by staying active.         This is a list of the screening recommended for you and due dates:  Health Maintenance  Topic Date Due   COVID-19 Vaccine (1) Never done   Pneumonia Vaccine (1 - PCV) Never done   Zoster (Shingles) Vaccine (1 of 2) Never done   Flu Shot  08/31/2021   Tetanus Vaccine  02/16/2022*   Hepatitis C Screening: USPSTF Recommendation to screen - Ages 18-79 yo.  Completed   HPV Vaccine  Aged Out   Colon Cancer Screening  Discontinued  *Topic was postponed. The date shown is not the original due date.

## 2021-10-08 ENCOUNTER — Ambulatory Visit (INDEPENDENT_AMBULATORY_CARE_PROVIDER_SITE_OTHER): Payer: Medicare Other

## 2021-10-08 DIAGNOSIS — I5022 Chronic systolic (congestive) heart failure: Secondary | ICD-10-CM

## 2021-10-08 DIAGNOSIS — I255 Ischemic cardiomyopathy: Secondary | ICD-10-CM | POA: Diagnosis not present

## 2021-10-12 LAB — CUP PACEART REMOTE DEVICE CHECK
Battery Remaining Longevity: 66 mo
Battery Remaining Percentage: 65 %
Battery Voltage: 2.96 V
Brady Statistic AP VP Percent: 54 %
Brady Statistic AP VS Percent: 40 %
Brady Statistic AS VP Percent: 1 %
Brady Statistic AS VS Percent: 4.8 %
Brady Statistic RA Percent Paced: 93 %
Brady Statistic RV Percent Paced: 55 %
Date Time Interrogation Session: 20230908020216
HighPow Impedance: 64 Ohm
Implantable Lead Implant Date: 20210312
Implantable Lead Implant Date: 20210312
Implantable Lead Location: 753859
Implantable Lead Location: 753860
Implantable Pulse Generator Implant Date: 20210312
Lead Channel Impedance Value: 440 Ohm
Lead Channel Impedance Value: 460 Ohm
Lead Channel Pacing Threshold Amplitude: 0.375 V
Lead Channel Pacing Threshold Amplitude: 1.125 V
Lead Channel Pacing Threshold Pulse Width: 0.5 ms
Lead Channel Pacing Threshold Pulse Width: 0.5 ms
Lead Channel Sensing Intrinsic Amplitude: 11.8 mV
Lead Channel Sensing Intrinsic Amplitude: 2.6 mV
Lead Channel Setting Pacing Amplitude: 1.375
Lead Channel Setting Pacing Amplitude: 1.375
Lead Channel Setting Pacing Pulse Width: 0.5 ms
Lead Channel Setting Sensing Sensitivity: 0.5 mV
Pulse Gen Serial Number: 111016652

## 2021-10-22 NOTE — Progress Notes (Signed)
Remote ICD transmission.   

## 2021-10-26 NOTE — Progress Notes (Unsigned)
Cardiology Office Note   Date:  10/27/2021   ID:  Daniel [name]., DOB [birth year], MRN [id number]  PCP:  [name], MD  Cardiologist:   [name], MD   Chief Complaint  Patient presents with   Coronary Artery Disease       History of Present Illness: Daniel [name]. is a 76 y.o. male who presents for follow up of CAD s/p CABG LIMA to D1, SVG to LAD, SVG to PDA on 05/12/2016, HLD intolerant to statin, HTN, ICM with baseline EF 30-35%, and h/o prostate CA. His last cardiac catheterization on 05/09/2016 showed EF 20-25%, akinesis of the distal inferoapical segment with significant hypokinesis globally consistent with ischemic cardiomyopathy, multivessel disease with 70% followed by total occlusion of large LAD with distal collateral arteries, 50% proximal left circumflex stenosis with total occlusion of distal left circumflex, total occlusion of mid RCA with distal collateral artery. Bypass surgery was recommended and he eventually underwent successful CABG 3 by Dr. Roxan Hockey on 05/12/2016 with LIMA to diagonal, SVG to LAD, SVG to PDA. He developed postoperative atrial fibrillation and was treated with IV amiodarone with conversion to sinus rhythm prior to discharge. He had a follow-up echocardiogram on 08/05/2016 which revealed LV EF remains low at 30-35% however slightly improved from his previous 20-25%.  He was placed on carvedilol and Entresto, spironolactone was discontinued due to elevated potassium level. Unfortunately, he went back to the hospital on 11/20/2016 with shortness breath, diffuse rash and presyncope. He was given prednisone for his rash. He stopped the [facility] and restarted ACE inhibitor as he was sure the rash was related to the former.  Last EF in July was 25 - 30%.   He could not take the BiDil.  His blood pressure dropped too low.  He had a low heart rate and we stopped Coreg.  I did manage to get him to take Zebeta but it turns out he will only take it if  his blood pressure is greater than 140.  He also stop spironolactone as he thought it was making his heart skip . He now status post ICD.  He returns for follow up.  He says he feels okay.  He has a little bit of heaviness in his feet when he walks a distance.  He is not describing neuropathic pain.  He is not describing calf discomfort.  Has not had any falls.  He denies any new shortness of breath, PND or orthopnea.  He says he does not feel well if his blood pressure systolic goes below 588.   Past Medical History:  Diagnosis Date   Abdominal aortic aneurysm ([place]) 01/2021   4 cm, infrarenal.  Also enlarged left common iliac artery--vascular eval 03/2021->recommended u/s rpt 1 yr.   Anxiety    Atrial fibrillation ([place])    Limited episode, emergency room, June, 2013, spontaneous conversion to sinus rhythm, was evaluated By [name] 2013- no further visits.  Post-op (CABG) a-fib, converted on amio but pt self d/c'd this med due to DOE/side effect profile.   CAD (coronary artery disease) 05/2016   a. 05/2016: NSTEMI with cath showing 3-vessel disease. Underwent CABG on 05/12/16 with LIMA-D1, SVG-LAD, and SVG-PDA.    COPD (chronic obstructive pulmonary disease) ([place]) fall 2016   Bullous changes noted on lower lung images of CT abd done by urology   Diverticulitis    Double vision 01/2020   MG ab w/u NEG.  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 Unable to have MRI due to having shrapnel in forehead.   Hyperlipidemia    statin intolerant. Pt declined advanced lipid clinic referral 08/2019, 03/2020, and 01/2021.   Hypertension    Ischemic cardiomyopathy 05/2016   EF 20-25% at the time  of NSTEMI/CABG.  Repeat EF 07/2016 30-35%.  07/2017 EF 25-30%, pt intol of entresto, bidil, and BB--for ICD 03/2019.   Microscopic hematuria Fall 2016   CT nl except nonobstructing stones.  Cystoscopy normal 12/30/14 (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  Urol ([name]) at [facility]; [name] referred him to [name] 03/2015: patient got bilat nerve sparing , robot assisted laparoscopic radical prostatectomy and pelvic lymphadenectomy.  Urol f/u, PSA surveillance showing very mild uptrend of PSA but not to the level of biochemical recurrence as of 05/2021 urology follow-up   RLS (restless legs syndrome)    Tobacco dependence    down to 1-2 cigs per day as of 11/2015.  Restarted 08/2016.   Urinary retention 05/2015   occurred s/p foley removal    Past Surgical History:  Procedure Laterality Date   aortic ultrasound  07/2012   2014 NO AAA.  01/2021-->4 cm AAA with R common iliac ectasia-->vasc surg ref   COLONOSCOPY  approx 2011 per pt   Done by surgeon in [place] after a bout of diverticulitis; normal per pt report--was told to repeat in 10 yrs.   CORONARY ARTERY BYPASS GRAFT N/A 05/12/2016   Procedure: CORONARY ARTERY BYPASS GRAFTING (CABG) TIMES THREE USING LEFT INTERNAL MAMMARY ARTERY AND RIGHT SAPHENOUS LEG VEIN HARVESTED ENDOSCOPICALLY;  Surgeon: [name], MD;  Location: [place];  Service: Open Heart Surgery;  Laterality: N/A;   ICD IMPLANT N/A 04/12/2019   Procedure: ICD IMPLANT;  Surgeon: Evans Lance, MD;  Location: [place] [facility];  Service: Cardiovascular;  Laterality: N/A;   INGUINAL HERNIA REPAIR  2003 and 2004   Both sides.   LAPAROSCOPY  2017   LEFT HEART CATH AND CORONARY ANGIOGRAPHY N/A 05/09/2016   Procedure: Left Heart Cath and  Coronary Angiography;  Surgeon: [name], MD;  Location: [place] [facility];  Service: Cardiovascular;  Laterality: N/A;   LITHOTRIPSY  2004   Dr. Maryland Pink in [place].   LYMPHADENECTOMY Bilateral 04/30/2015   Procedure: PELVIC LYMPHADENECTOMY;  Surgeon: [name], MD;  [facility];  Service: Urology;  Laterality: Bilateral;   PROSTATE BIOPSY  02/05/2015   Prostate adenocarcinoma: pt considering treatment options as of 02/19/15   ROBOT ASSISTED LAPAROSCOPIC RADICAL PROSTATECTOMY N/A 04/30/2015   Procedure: XI ROBOTIC ASSISTED LAPAROSCOPIC RADICAL PROSTATECTOMY LEVEL 2;  Surgeon: [name], MD;  [facility];  Service: Urology;  Laterality: N/A;   TEE WITHOUT CARDIOVERSION N/A 05/12/2016   Procedure: TRANSESOPHAGEAL ECHOCARDIOGRAM (TEE);  Surgeon: [name], MD;  Location: [place];  Service: Open Heart Surgery;  Laterality: N/A;   TRANSTHORACIC ECHOCARDIOGRAM  03/22/2006; 05/2016; 07/2016; 07/2017; 01/02/20   2008: EF 65%, normal valves, no wall motion abnormalties, LA size normal.  05/2016 in context of non-STEMI, EF 20-25%, mild AV and MV regurg.  08/05/16: EF 30-35%, akinesis of mid lateral myoc, grd II DD, mild aortic 07/2017: EF 25-30%, mult areas of akinesis, grd I DD. 01/2020 EF 25-30%, sev LV dsfxn, valves fine, aortic root 59m     Current Outpatient Medications  Medication Sig Dispense Refill   ALPRAZolam (XANAX) 0.25 MG tablet tAKE 1-2 tabLETS BY MOUTH AT BEDTIME AS NEEDED FOR insomnia/restless legs 60 tablet 5   aspirin EC 81 MG tablet Take 1 tablet (81 mg total) by mouth daily. Swallow whole. 90 tablet 3   benazepril (LOTENSIN) 20 MG tablet Take 1 tablet (20 mg total) by mouth daily. 1 tab po bid 180 tablet 3   bisoprolol (ZEBETA) 5 MG tablet Take 1 tablet (5 mg total) by mouth daily. (Patient taking differently: Take 5 mg by mouth as needed. When BP is higher than 140) 30 tablet 0   Omega-3 Fatty Acids (FISH OIL) 1000 MG CAPS Take 1,000 mg by mouth daily.       Polyethyl Glycol-Propyl Glycol (LUBRICANT EYE DROPS) 0.4-0.3 % SOLN Place 1 drop into both eyes 3 (three) times daily as needed (dry/irritated eyes.).     Vitamin D, Cholecalciferol, 400 units CAPS Take 400 Units by mouth daily.      ondansetron (ZOFRAN) 4 MG tablet Take 1 tablet (4 mg total) by mouth every 8 (eight) hours as needed for nausea or vomiting. (Patient not taking: Reported on 06/17/2021) 20 tablet 1   No current facility-administered medications for this visit.    Allergies:   Entresto [sacubitril-valsartan], Amlodipine, Contrast media [iodinated contrast media], and Hydralazine    ROS:  Please see the history of present illness.   Otherwise, review of systems are positive for NONE.   All other systems are reviewed and negative.    PHYSICAL EXAM: VS:  BP (!) 168/84   Pulse 76   Ht '5\' 10"'$  (1.778 m)   Wt 149 lb (67.6 kg)   BMI 21.38 kg/m  , BMI Body mass index is 21.38 kg/m.  GENERAL:  Well appearing NECK:  No jugular venous distention, waveform within normal limits, carotid upstroke brisk and symmetric, no bruits, no thyromegaly LUNGS:  Clear to auscultation bilaterally CHEST:   Well healed sternotomy scar.  Well healed device pocket.  HEART:  PMI not displaced or sustained,S1 and S2 within normal limits, no S3, no S4, no clicks, no rubs, NO murmurs ABD:  Flat, positive bowel sounds normal in frequency in pitch, no bruits, no rebound, no guarding, no midline pulsatile mass, no hepatomegaly, no splenomegaly EXT:  2 plus pulses throughout, no edema, no cyanosis no clubbing   EKG:  EKG is not  done today   Recent Labs: 02/04/2021: ALT 10 06/22/2021: BUN 19; Creatinine, Ser 1.05; Potassium 4.3; Sodium 138    Lipid Panel    Component Value Date/Time   CHOL 177 02/04/2021 1036   CHOL 137 09/16/2016 0858   TRIG 58.0 02/04/2021 1036   HDL 44.50 02/04/2021 1036   HDL 35 (L) 09/16/2016 0858   CHOLHDL 4 02/04/2021 1036   VLDL 11.6 02/04/2021 1036   LDLCALC 121 (H)  02/04/2021 1036   LDLCALC 88 09/16/2016 0858      Wt Readings from Last 3 Encounters:  10/27/21 149 lb (67.6 kg)  06/17/21 153 lb (69.4 kg)  05/28/21 152 lb  9.6 oz (69.2 kg)      Other studies Reviewed: Additional studies/ records that were reviewed today include:   Labs Review of the above records demonstrates:  See elsewhere  ASSESSMENT AND PLAN:  CAD s/p CABG:    The patient has no new sypmtoms.  No further cardiovascular testing is indicated.  We will continue with aggressive risk reduction and meds as listed.  Ischemic cardiomyopathy:    He does not want further med titration.  No change in therapy.  Hypertension: Blood pressure is elevated but again he refuses to take further med titration.  No change in therapy.   Hyperlipidemia:    LDL was 121.  He has not wanted medications.   ICD: He is up-to-date with follow-up in September.  I reviewed these records.  The following changes have been made: None  Labs/ tests ordered today include:  None No orders of the defined types were placed in this encounter.    Disposition:   FU with me in 12  months.    Signed, [name], MD  10/27/2021 4:36 PM    [place]

## 2021-10-27 ENCOUNTER — Ambulatory Visit (INDEPENDENT_AMBULATORY_CARE_PROVIDER_SITE_OTHER): Payer: Medicare Other | Admitting: Cardiology

## 2021-10-27 ENCOUNTER — Encounter: Payer: Self-pay | Admitting: Cardiology

## 2021-10-27 VITALS — BP 168/84 | HR 76 | Ht 70.0 in | Wt 149.0 lb

## 2021-10-27 DIAGNOSIS — I10 Essential (primary) hypertension: Secondary | ICD-10-CM

## 2021-10-27 DIAGNOSIS — E785 Hyperlipidemia, unspecified: Secondary | ICD-10-CM

## 2021-10-27 DIAGNOSIS — I255 Ischemic cardiomyopathy: Secondary | ICD-10-CM

## 2021-10-27 DIAGNOSIS — I2510 Atherosclerotic heart disease of native coronary artery without angina pectoris: Secondary | ICD-10-CM

## 2021-10-27 NOTE — Patient Instructions (Signed)
Medication Instructions:  The current medical regimen is effective;  continue present plan and medications.  *If you need a refill on your cardiac medications before your next appointment, please call your pharmacy*  Follow-Up: At [facility], you and your health needs are our priority.  As part of our continuing mission to provide you with exceptional heart care, we have created designated Provider Care Teams.  These Care Teams include your primary Cardiologist (physician) and Advanced Practice Providers (APPs -  Physician Assistants and Nurse Practitioners) who all work together to provide you with the care you need, when you need it.  We recommend signing up for the patient portal called "MyChart".  Sign up information is provided on this After Visit Summary.  MyChart is used to connect with patients for Virtual Visits (Telemedicine).  Patients are able to view lab/test results, encounter notes, upcoming appointments, etc.  Non-urgent messages can be sent to your provider as well.   To learn more about what you can do with MyChart, go to [email or web address].    Your next appointment:   1 year(s)  The format for your next appointment:   In Person  Provider:   Tobey Hochrein, MD     Important Information About Sugar       

## 2021-11-17 ENCOUNTER — Telehealth: Payer: Self-pay | Admitting: Family Medicine

## 2021-11-17 ENCOUNTER — Ambulatory Visit (INDEPENDENT_AMBULATORY_CARE_PROVIDER_SITE_OTHER): Payer: Medicare Other | Admitting: Family Medicine

## 2021-11-17 ENCOUNTER — Encounter: Payer: Self-pay | Admitting: Family Medicine

## 2021-11-17 VITALS — BP 168/90 | HR 74 | Temp 97.6°F | Ht 70.0 in | Wt 150.2 lb

## 2021-11-17 DIAGNOSIS — I10 Essential (primary) hypertension: Secondary | ICD-10-CM

## 2021-11-17 DIAGNOSIS — C61 Malignant neoplasm of prostate: Secondary | ICD-10-CM

## 2021-11-17 DIAGNOSIS — Z125 Encounter for screening for malignant neoplasm of prostate: Secondary | ICD-10-CM

## 2021-11-17 LAB — BASIC METABOLIC PANEL
BUN: 18 mg/dL (ref 6–23)
CO2: 26 mEq/L (ref 19–32)
Calcium: 9.9 mg/dL (ref 8.4–10.5)
Chloride: 101 mEq/L (ref 96–112)
Creatinine, Ser: 0.91 mg/dL (ref 0.40–1.50)
GFR: 81.85 mL/min (ref >60.00)
Glucose, Bld: 74 mg/dL (ref 70–99)
Potassium: 4.4 mEq/L (ref 3.5–5.1)
Sodium: 138 mEq/L (ref 135–145)

## 2021-11-17 LAB — PSA, MEDICARE: PSA: 0.11 ng/ml (ref 0.10–4.00)

## 2021-11-17 MED ORDER — BENAZEPRIL HCL 20 MG PO TABS: 20.0000 mg | ORAL_TABLET | Freq: Every day | ORAL | 3 refills | Status: DC

## 2021-11-17 MED ORDER — ALPRAZOLAM 0.25 MG PO TABS: ORAL_TABLET | ORAL | 5 refills | Status: DC

## 2021-11-17 NOTE — Telephone Encounter (Signed)
Spoke with pharmacist, Ashby Dawes regarding benzapril sig directions. Qty was 180 but sig directions state once daily and twice daily. Per visit today, "continue benazepril 20 mg twice a day." Verbal orders given for twice a day correction.

## 2021-11-17 NOTE — Progress Notes (Signed)
OFFICE VISIT  11/17/2021  CC:  Chief Complaint  Patient presents with   Hypertension    Pt is fasting   Insomnia    Patient is a 76 y.o. male who presents for 73-monthfollow-up hypertension and insomnia/restless legs syndrome. A/P as of last visit: "#1 hypertension.  Not ideal control but patient is only comfortable currently taking benazepril 20 mg 1-2 times a day. Basic metabolic panel today.  2.  Tobacco dependence.  He knows he needs to quit completely.  Encouraged him today.  3.  History of prostate cancer. Surveillance PSAs consistently undetectable for years. PSA barely detectable at 0.1 in January this year. He has follow-up with Dr. BAlinda Moneyin the next 1 to 2 weeks but patient requesting PSA here today so I will do this and forward result to Dr. BLynne Loganoffice."  INTERIM HAdair Patter JBrocktonfeels very well. He is little more stressed lately and feels like his blood pressure has been up a little bit more than usual. He does not feel well if his blood pressure goes up less than 130/80. He takes his benazepril 20 mg every morning and evening. He has bisoprolol to take 5 mg daily if his blood pressure goes greater than 140. He states usually blood pressure in the morning is 140-160 over 90s and in the evenings is more around 120-130 over 70s.  He has alprazolam to use for restless leg syndrome, which she typically gets only sporadically. PMP AWARE reviewed today: most recent rx for alprazolam was filled 10/07/2021, #60, rx by me. No red flags.  ROS: No palpitations, dizziness, chest pains, or dyspnea on exertion.  Past Medical History:  Diagnosis Date   Abdominal aortic aneurysm ([place] 01/2021   4 cm, infrarenal.  Also enlarged left common iliac artery--vascular eval 03/2021->recommended u/s rpt 1 yr.   Anxiety    Atrial fibrillation ([place]    Limited episode, emergency room, June, 2013, spontaneous conversion to sinus rhythm, was evaluated By Dr. KRon Parker2013- no further visits.   Post-op (CABG) a-fib, converted on amio but pt self d/c'd this med due to DOE/side effect profile.   CAD (coronary artery disease) 05/2016   a. 05/2016: NSTEMI with cath showing 3-vessel disease. Underwent CABG on 05/12/16 with LIMA-D1, SVG-LAD, and SVG-PDA.    COPD (chronic obstructive pulmonary disease) ([place] fall 2016   Bullous changes noted on lower lung images of CT abd done by urology   Diverticulitis    Double vision 01/2020   MG ab w/u NEG.  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 Unable to have MRI due to having shrapnel in forehead.   Hyperlipidemia    statin intolerant. Pt declined advanced lipid clinic referral 08/2019, 03/2020, and 01/2021.   Hypertension    Ischemic cardiomyopathy 05/2016   EF 20-25% at the time of NSTEMI/CABG.  Repeat EF 07/2016 30-35%.  07/2017 EF 25-30%, pt intol of entresto, bidil, and BB--for ICD 03/2019.   Microscopic hematuria Fall 2016   CT nl except nonobstructing stones.  Cystoscopy normal 12/30/14 (Dr. BExie Parody   Nephrolithiasis    11 mm stone on R, 2 mm stone on L, ureters clear   Non-STEMI (non-ST elevated myocardial infarction) ([place] 05/2016  with impaired LV function   Prostatic adenocarcinoma ([place]) 02/2015   No evidence of metastatic dz on CT pelv 02/2015.  Urol ([name]) at [facility]; [name] referred him to [name] 03/2015: patient got bilat nerve sparing , robot assisted laparoscopic radical prostatectomy and pelvic lymphadenectomy.  Urol f/u, PSA surveillance showing very mild uptrend of PSA but not to the level of biochemical recurrence as of 05/2021 urology follow-up   RLS (restless legs syndrome)    Tobacco dependence     down to 1-2 cigs per day as of 11/2015.  Restarted 08/2016.   Urinary retention 05/2015   occurred s/p foley removal    Past Surgical History:  Procedure Laterality Date   aortic ultrasound  07/2012   2014 NO AAA.  01/2021-->4 cm AAA with R common iliac ectasia-->vasc surg ref   COLONOSCOPY  approx 2011 per pt   Done by surgeon in [place] after a bout of diverticulitis; normal per pt report--was told to repeat in 10 yrs.   CORONARY ARTERY BYPASS GRAFT N/A 05/12/2016   Procedure: CORONARY ARTERY BYPASS GRAFTING (CABG) TIMES THREE USING LEFT INTERNAL MAMMARY ARTERY AND RIGHT SAPHENOUS LEG VEIN HARVESTED ENDOSCOPICALLY;  Surgeon: [name], MD;  Location: [place];  Service: Open Heart Surgery;  Laterality: N/A;   ICD IMPLANT N/A 04/12/2019   Procedure: ICD IMPLANT;  Surgeon: Evans Lance, MD;  Location: [place] [facility];  Service: Cardiovascular;  Laterality: N/A;   INGUINAL HERNIA REPAIR  2003 and 2004   Both sides.   LAPAROSCOPY  2017   LEFT HEART CATH AND CORONARY ANGIOGRAPHY N/A 05/09/2016   Procedure: Left Heart Cath and Coronary Angiography;  Surgeon: [name], MD;  Location: [place] [facility];  Service: Cardiovascular;  Laterality: N/A;   LITHOTRIPSY  2004   Dr. Maryland Pink in [place].   LYMPHADENECTOMY Bilateral 04/30/2015   Procedure: PELVIC LYMPHADENECTOMY;  Surgeon: [name], MD;  [facility];  Service: Urology;  Laterality: Bilateral;   PROSTATE BIOPSY  02/05/2015   Prostate adenocarcinoma: pt considering treatment options as of 02/19/15   ROBOT ASSISTED LAPAROSCOPIC RADICAL PROSTATECTOMY N/A 04/30/2015   Procedure: XI ROBOTIC ASSISTED LAPAROSCOPIC RADICAL PROSTATECTOMY LEVEL 2;  Surgeon: [name], MD;  [facility];  Service: Urology;  Laterality: N/A;   TEE WITHOUT CARDIOVERSION N/A 05/12/2016   Procedure: TRANSESOPHAGEAL ECHOCARDIOGRAM (TEE);  Surgeon: [name], MD;  Location: [place];  Service: Open Heart Surgery;  Laterality: N/A;    TRANSTHORACIC ECHOCARDIOGRAM  03/22/2006; 05/2016; 07/2016; 07/2017; 01/02/20   2008: EF 65%, normal valves, no wall motion abnormalties, LA size normal.  05/2016 in context of non-STEMI, EF 20-25%, mild AV and MV regurg.  08/05/16: EF 30-35%, akinesis of mid lateral myoc, grd II DD, mild aortic 07/2017: EF 25-30%, mult areas of akinesis, grd I DD. 01/2020 EF 25-30%, sev LV dsfxn, valves fine, aortic root 54m    Outpatient Medications Prior to Visit  Medication Sig Dispense Refill   ALPRAZolam (XANAX) 0.25 MG tablet tAKE 1-2 tabLETS BY MOUTH AT BEDTIME AS NEEDED FOR insomnia/restless legs 60 tablet 5   aspirin EC 81 MG tablet Take 1 tablet (81 mg total) by mouth daily. Swallow whole. 90 tablet 3   benazepril (LOTENSIN) 20 MG tablet Take 1 tablet (20 mg total) by mouth daily. 1 tab po bid 180 tablet 3   bisoprolol (ZEBETA) 5 MG tablet Take 1 tablet (5 mg total) by mouth daily. (Patient taking differently: Take 5 mg by  mouth as needed. When BP is higher than 140) 30 tablet 0   Omega-3 Fatty Acids (FISH OIL) 1000 MG CAPS Take 1,000 mg by mouth daily.      Polyethyl Glycol-Propyl Glycol (LUBRICANT EYE DROPS) 0.4-0.3 % SOLN Place 1 drop into both eyes 3 (three) times daily as needed (dry/irritated eyes.).     Vitamin D, Cholecalciferol, 400 units CAPS Take 400 Units by mouth daily.      ondansetron (ZOFRAN) 4 MG tablet Take 1 tablet (4 mg total) by mouth every 8 (eight) hours as needed for nausea or vomiting. (Patient not taking: Reported on 06/17/2021) 20 tablet 1   No facility-administered medications prior to visit.    Allergies  Allergen Reactions   Entresto [Sacubitril-Valsartan] Hives and Shortness Of Breath   Amlodipine Swelling    LE swelling   Contrast Media [Iodinated Contrast Media] Other (See Comments)    Prostate problem had to wear a catheter for two weeks after having dye.    Hydralazine Palpitations and Other (See Comments)    Fatigue+    ROS As per HPI  PE:    11/17/2021   10:08  AM 10/27/2021    3:39 PM 06/17/2021   11:14 AM  Vitals with BMI  Height '5\' 10"'$  '5\' 10"'$  '5\' 10"'$   Weight 150 lbs 3 oz 149 lbs 153 lbs  BMI 21.55 40.34 74.25  Systolic 956 387 564  Diastolic 90 84 84  Pulse 74 76 70     Physical Exam  Gen: Alert, well appearing.  Patient is oriented to person, place, time, and situation. AFFECT: pleasant, lucid thought and speech. CV: RRR, soft systolic flow murmur, no diastolic murmur, no /r/g.   LUNGS: CTA bilat, nonlabored resps, good aeration in all lung fields. EXT: no clubbing or cyanosis.  no edema.    LABS:  Last CBC Lab Results  Component Value Date   WBC 4.7 10/22/2020   HGB 14.1 10/22/2020   HCT 42.0 10/22/2020   MCV 93.6 10/22/2020   MCH 31.1 10/01/2020   RDW 14.4 10/22/2020   PLT 134.0 (L) 33/29/5188   Last metabolic panel Lab Results  Component Value Date   GLUCOSE 94 06/22/2021   NA 138 06/22/2021   K 4.3 06/22/2021   CL 102 06/22/2021   CO2 28 06/22/2021   BUN 19 06/22/2021   CREATININE 1.05 06/22/2021   GFRNONAA >60 10/01/2020   CALCIUM 9.9 06/22/2021   PROT 6.4 02/04/2021   ALBUMIN 4.2 02/04/2021   LABGLOB 2.2 09/16/2016   AGRATIO 2.0 09/16/2016   BILITOT 0.8 02/04/2021   ALKPHOS 74 02/04/2021   AST 18 02/04/2021   ALT 10 02/04/2021   ANIONGAP 7 10/01/2020   Last lipids Lab Results  Component Value Date   CHOL 177 02/04/2021   HDL 44.50 02/04/2021   LDLCALC 121 (H) 02/04/2021   TRIG 58.0 02/04/2021   CHOLHDL 4 02/04/2021   Last hemoglobin A1c Lab Results  Component Value Date   HGBA1C 5.2 01/02/2020   Last thyroid functions Lab Results  Component Value Date   TSH 0.48 09/29/2020   Lab Results  Component Value Date   PSA 0.15 06/22/2021   PSA 0.10 02/04/2021   PSA 0.07 (L) 09/29/2020    IMPRESSION AND PLAN:  #1 hypertension, uncontrolled but allowing for elevated blood pressures since he feels bad if blood pressure drops below 130/80. Encouraged him to take his bisoprolol 5 mg as needed  for blood pressure greater than 416 systolic.  Otherwise, continue  benazepril 20 mg twice a day.  2.  Restless leg syndrome.  Only occasionally has to use alprazolam and this works very well.  #3 prostate cancer.  History of prostatectomy. Followed by [name]. PSA level has become detectable again in the last year. [name] has not recommended any further treatment at this time. We will monitor PSA today and make sure [name] is informed of the result. Patient states he has follow-up with him soon.  An After Visit Summary was printed and given to the patient.  FOLLOW UP: No follow-ups on file.  Signed:  Crissie Sickles, MD           11/17/2021

## 2021-11-17 NOTE — Telephone Encounter (Signed)
Jeannie with [facility] called requesting information for a medication. Please give her a call at [phone]

## 2021-11-20 IMAGING — CR DG CHEST 2V
2 series · 2 of 2 positions shown · non-contrast
Comparison: 06/14/2016

CLINICAL DATA: Status post defibrillator placement

EXAM:
CHEST - 2 VIEW

[w chest lat]
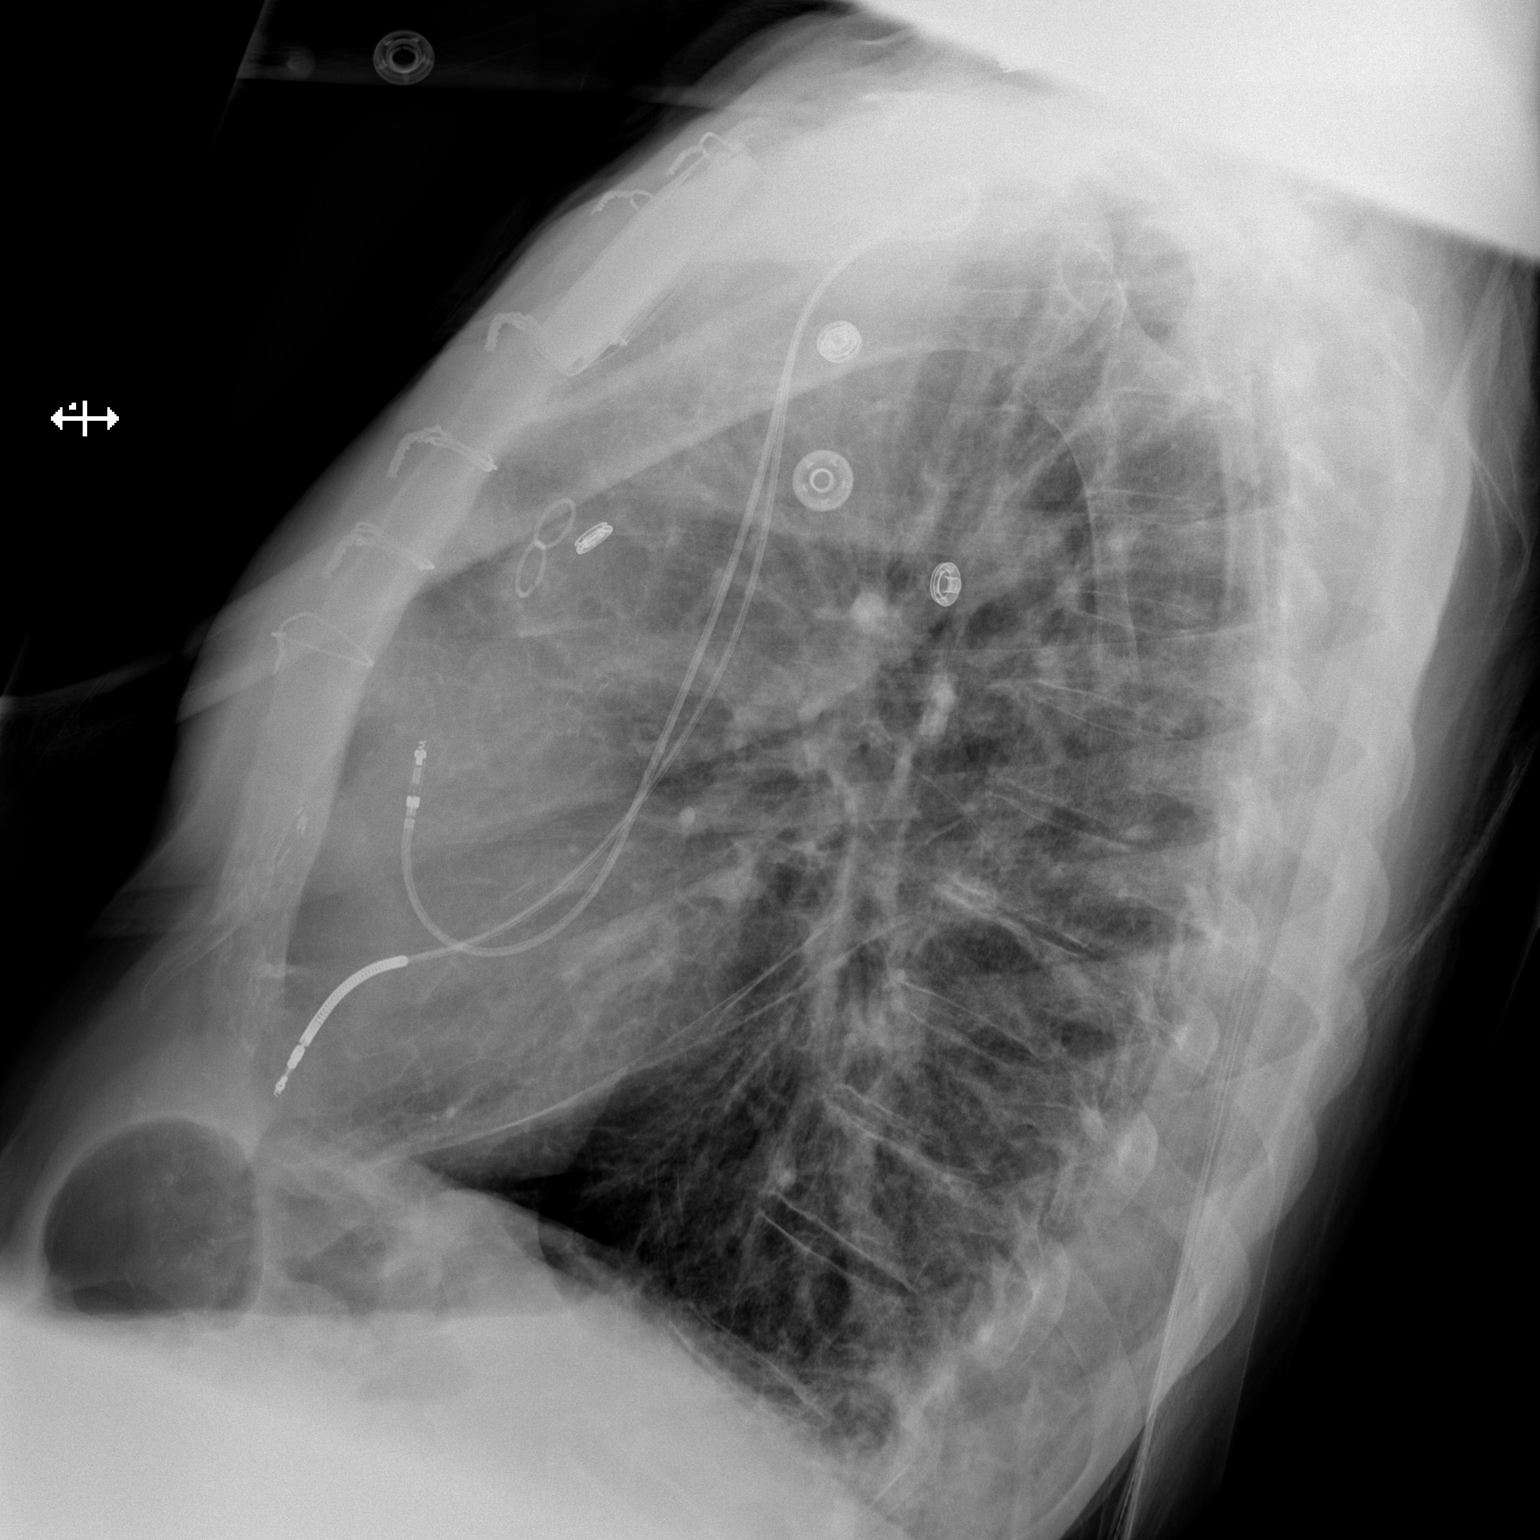

[x chest ap]
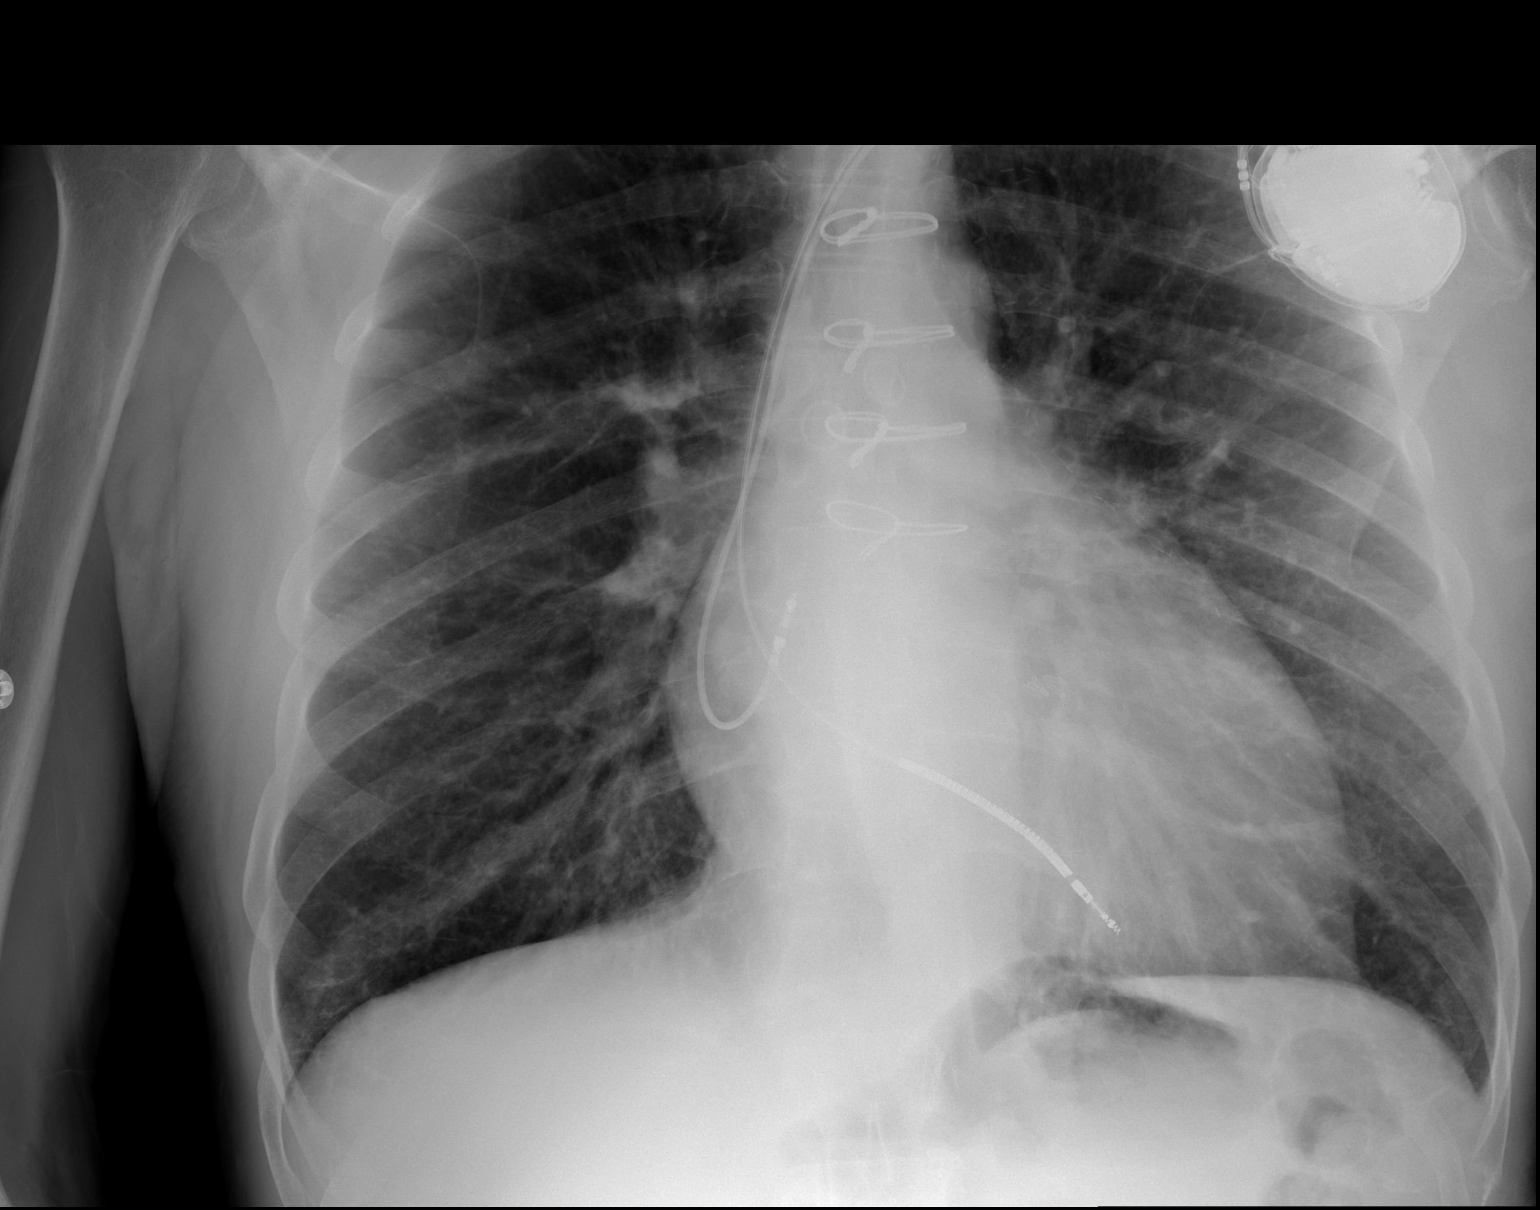

[2 of 2 positions shown; findings below may reference images not displayed]

FINDINGS: Cardiac shadow is mildly prominent. Postsurgical changes are again
seen. New defibrillator is seen in satisfactory position. No
pneumothorax is noted. Lungs are hyperinflated but clear. No bony
abnormality is seen.
IMPRESSION: No pneumothorax following defibrillator placement.

COPD.

## 2021-12-12 ENCOUNTER — Encounter (HOSPITAL_BASED_OUTPATIENT_CLINIC_OR_DEPARTMENT_OTHER): Payer: Self-pay | Admitting: Pediatrics

## 2021-12-12 ENCOUNTER — Emergency Department (HOSPITAL_BASED_OUTPATIENT_CLINIC_OR_DEPARTMENT_OTHER): Payer: Medicare Other

## 2021-12-12 ENCOUNTER — Other Ambulatory Visit: Payer: Self-pay

## 2021-12-12 ENCOUNTER — Emergency Department (HOSPITAL_BASED_OUTPATIENT_CLINIC_OR_DEPARTMENT_OTHER)
Admission: EM | Admit: 2021-12-12 | Discharge: 2021-12-12 | Disposition: A | Discharge status: Home / Self Care | Payer: Medicare Other | Attending: Emergency Medicine | Admitting: Emergency Medicine

## 2021-12-12 DIAGNOSIS — R600 Localized edema: Secondary | ICD-10-CM | POA: Insufficient documentation

## 2021-12-12 DIAGNOSIS — R059 Cough, unspecified: Secondary | ICD-10-CM | POA: Diagnosis not present

## 2021-12-12 DIAGNOSIS — Z7982 Long term (current) use of aspirin: Secondary | ICD-10-CM | POA: Diagnosis not present

## 2021-12-12 DIAGNOSIS — J449 Chronic obstructive pulmonary disease, unspecified: Secondary | ICD-10-CM | POA: Diagnosis not present

## 2021-12-12 DIAGNOSIS — R0602 Shortness of breath: Secondary | ICD-10-CM | POA: Insufficient documentation

## 2021-12-12 DIAGNOSIS — R0981 Nasal congestion: Secondary | ICD-10-CM | POA: Insufficient documentation

## 2021-12-12 DIAGNOSIS — Z1152 Encounter for screening for COVID-19: Secondary | ICD-10-CM | POA: Diagnosis not present

## 2021-12-12 DIAGNOSIS — I2510 Atherosclerotic heart disease of native coronary artery without angina pectoris: Secondary | ICD-10-CM | POA: Diagnosis not present

## 2021-12-12 DIAGNOSIS — R0600 Dyspnea, unspecified: Secondary | ICD-10-CM | POA: Diagnosis not present

## 2021-12-12 LAB — COMPREHENSIVE METABOLIC PANEL
ALT: 24 U/L (ref 0–44)
AST: 29 U/L (ref 15–41)
Albumin: 4.1 g/dL (ref 3.5–5.0)
Alkaline Phosphatase: 63 U/L (ref 38–126)
Anion gap: 7 (ref 5–15)
BUN: 22 mg/dL (ref 8–23)
CO2: 25 mmol/L (ref 22–32)
Calcium: 9.1 mg/dL (ref 8.9–10.3)
Chloride: 106 mmol/L (ref 98–111)
Creatinine, Ser: 0.93 mg/dL (ref 0.61–1.24)
GFR, Estimated: >60 mL/min (ref >60)
Glucose, Bld: 151 mg/dL — ABNORMAL HIGH (ref 70–99)
Potassium: 3.8 mmol/L (ref 3.5–5.1)
Sodium: 138 mmol/L (ref 135–145)
Total Bilirubin: 1.2 mg/dL (ref 0.3–1.2)
Total Protein: 6.8 g/dL (ref 6.5–8.1)

## 2021-12-12 LAB — CBC WITH DIFFERENTIAL/PLATELET
Abs Immature Granulocytes: 0.02 10*3/uL (ref 0.00–0.07)
Basophils Absolute: 0 10*3/uL (ref 0.0–0.1)
Basophils Relative: 1 %
Eosinophils Absolute: 0.1 10*3/uL (ref 0.0–0.5)
Eosinophils Relative: 1 %
HCT: 48.5 % (ref 39.0–52.0)
Hemoglobin: 16 g/dL (ref 13.0–17.0)
Immature Granulocytes: 0 %
Lymphocytes Relative: 18 %
Lymphs Abs: 1.2 10*3/uL (ref 0.7–4.0)
MCH: 32.1 pg (ref 26.0–34.0)
MCHC: 33 g/dL (ref 30.0–36.0)
MCV: 97.4 fL (ref 80.0–100.0)
Monocytes Absolute: 0.4 10*3/uL (ref 0.1–1.0)
Monocytes Relative: 5 %
Neutro Abs: 5 10*3/uL (ref 1.7–7.7)
Neutrophils Relative %: 75 %
Platelets: 131 10*3/uL — ABNORMAL LOW (ref 150–400)
RBC: 4.98 MIL/uL (ref 4.22–5.81)
RDW: 14.3 % (ref 11.5–15.5)
WBC: 6.7 10*3/uL (ref 4.0–10.5)
nRBC: 0 % (ref 0.0–0.2)

## 2021-12-12 LAB — BRAIN NATRIURETIC PEPTIDE: B Natriuretic Peptide: 1654.7 pg/mL — ABNORMAL HIGH (ref 0.0–100.0)

## 2021-12-12 LAB — LIPASE, BLOOD: Lipase: 44 U/L (ref 11–51)

## 2021-12-12 LAB — TROPONIN I (HIGH SENSITIVITY): Troponin I (High Sensitivity): 26 ng/L — ABNORMAL HIGH (ref <18)

## 2021-12-12 LAB — SARS CORONAVIRUS 2 BY RT PCR: SARS Coronavirus 2 by RT PCR: NEGATIVE

## 2021-12-12 MED ORDER — FUROSEMIDE 10 MG/ML IJ SOLN
40.0000 mg | Freq: Once | INTRAMUSCULAR | Status: AC
  Administered 2021-12-12: 40 mg via INTRAVENOUS
  Filled 2021-12-12: qty 4

## 2021-12-12 MED ORDER — FUROSEMIDE 20 MG PO TABS: 40.0000 mg | ORAL_TABLET | Freq: Every day | ORAL | 0 refills | Status: DC

## 2021-12-12 NOTE — ED Notes (Addendum)
Patient states that he wakes up shortness of breath for the last couple day. States that blood pressure also be elevated . States that once he sits up he is ok. Denies shortness of breath today .Patient is breathing non labored . Denied any pain at this time. Denies any headache

## 2021-12-12 NOTE — Discharge Instructions (Signed)
Please follow-up with your cardiologist this week.  Take next dose of Lasix tomorrow

## 2021-12-12 NOTE — ED Provider Notes (Signed)
[facility] EMERGENCY DEPARTMENT Provider Note   CSN: [id number] Arrival date & time: 12/12/21  0913     History  Chief Complaint  Patient presents with   Shortness of Breath    Daniel [name]. is a 76 y.o. male.  Patient here with shortness of breath.  He did have some nasal congestion and felt short of breath in the morning.  This is in the last several days.  Feels like he has a cold.  He is not having any chest pain or active shortness of breath or active symptoms now.  He denies any major leg swelling.  Denies any abdominal pain.  Nothing makes it worse or better.  No fever.  May be a dry cough.  No sick contacts.  No recent illness.  He is done well from a chest pain standpoint since his bypass surgery 5 years ago.  The history is provided by the patient.       Home Medications Prior to Admission medications   Medication Sig Start Date End Date Taking? Authorizing Provider  furosemide (LASIX) 20 MG tablet Take 2 tablets (40 mg total) by mouth daily for 4 days. 12/12/21 12/16/21 Yes Tarun Patchell, DO  ALPRAZolam Duanne Moron) 0.25 MG tablet tAKE 1-2 tabLETS BY MOUTH AT BEDTIME AS NEEDED FOR insomnia/restless legs 11/17/21   McGowen, Adrian Blackwater, MD  aspirin EC 81 MG tablet Take 1 tablet (81 mg total) by mouth daily. Swallow whole. 02/05/20   [name], MD  benazepril (LOTENSIN) 20 MG tablet Take 1 tablet (20 mg total) by mouth daily. 1 tab po bid 11/17/21   McGowen, Adrian Blackwater, MD  bisoprolol (ZEBETA) 5 MG tablet Take 1 tablet (5 mg total) by mouth daily. Patient taking differently: Take 5 mg by mouth as needed. When BP is higher than 140 03/11/21   McGowen, Adrian Blackwater, MD  Omega-3 Fatty Acids (FISH OIL) 1000 MG CAPS Take 1,000 mg by mouth daily.     [provider]  ondansetron (ZOFRAN) 4 MG tablet Take 1 tablet (4 mg total) by mouth every 8 (eight) hours as needed for nausea or vomiting. Patient not taking: Reported on 06/17/2021 10/06/20   [name], MD   Polyethyl Glycol-Propyl Glycol (LUBRICANT EYE DROPS) 0.4-0.3 % SOLN Place 1 drop into both eyes 3 (three) times daily as needed (dry/irritated eyes.).    [provider]  Vitamin D, Cholecalciferol, 400 units CAPS Take 400 Units by mouth daily.     [provider]  Cetirizine HCl (ZYRTEC ALLERGY) 10 MG CAPS Take 1 capsule (10 mg total) by mouth daily. 11/06/14 11/06/14  Malvin Johns, MD      Allergies    Entresto [sacubitril-valsartan], Amlodipine, Contrast media [iodinated contrast media], and Hydralazine    Review of Systems   Review of Systems  Physical Exam Updated Vital Signs BP (!) 143/89   Pulse 73   Temp (!) 97.4 F (36.3 C) (Oral)   Resp 14   Ht '5\' 10"'$  (1.778 m)   Wt 71.2 kg   SpO2 100%   BMI 22.53 kg/m  Physical Exam Vitals and nursing note reviewed.  Constitutional:      General: He is not in acute distress.    Appearance: He is well-developed. He is not ill-appearing.  HENT:     Head: Normocephalic and atraumatic.  Eyes:     Conjunctiva/sclera: Conjunctivae normal.     Pupils: Pupils are equal, round, and reactive to light.  Cardiovascular:  Rate and Rhythm: Normal rate and regular rhythm.     Pulses: Normal pulses.     Heart sounds: Normal heart sounds. No murmur heard. Pulmonary:     Effort: Pulmonary effort is normal. No respiratory distress.     Breath sounds: Normal breath sounds. No decreased breath sounds.  Abdominal:     Palpations: Abdomen is soft.     Tenderness: There is no abdominal tenderness.  Musculoskeletal:        General: No swelling.     Cervical back: Normal range of motion and neck supple.     Right lower leg: Edema present.     Left lower leg: Edema present.     Comments: Trace edema bilaterally  Skin:    General: Skin is warm and dry.     Capillary Refill: Capillary refill takes less than 2 seconds.  Neurological:     General: No focal deficit present.     Mental Status: He is alert.  Psychiatric:         Mood and Affect: Mood normal.     ED Results / Procedures / Treatments   Labs (all labs ordered are listed, but only abnormal results are displayed) Labs Reviewed  CBC WITH DIFFERENTIAL/PLATELET - Abnormal; Notable for the following components:      Result Value   Platelets 131 (*)    All other components within normal limits  COMPREHENSIVE METABOLIC PANEL - Abnormal; Notable for the following components:   Glucose, Bld 151 (*)    All other components within normal limits  BRAIN NATRIURETIC PEPTIDE - Abnormal; Notable for the following components:   B Natriuretic Peptide 1,654.7 (*)    All other components within normal limits  TROPONIN I (HIGH SENSITIVITY) - Abnormal; Notable for the following components:   Troponin I (High Sensitivity) 26 (*)    All other components within normal limits  SARS CORONAVIRUS 2 BY RT PCR  LIPASE, BLOOD    EKG EKG Interpretation  Date/Time:  Sunday December 12 2021 09:26:17 EST Ventricular Rate:  74 PR Interval:  220 QRS Duration: 145 QT Interval:  443 QTC Calculation: 492 R Axis:   106 Text Interpretation: Sinus rhythm Paired ventricular premature complexes Confirmed by Lennice Sites ([id number]) on 12/12/2021 9:38:17 AM  Radiology DG Chest Portable 1 View  Result Date: 12/12/2021 CLINICAL DATA:  Cough, difficulty breathing EXAM: PORTABLE CHEST 1 VIEW COMPARISON:  10/01/2020 FINDINGS: Transverse diameter of heart is increased. Central pulmonary vessels are prominent. There is previous coronary bypass surgery. Pacemaker/defibrillator battery is seen in left infraclavicular region with tips of leads in right atrium and right ventricle. There are no signs of alveolar pulmonary edema or focal pulmonary consolidation. There is slight prominence of interstitial markings in the lower lung fields. IMPRESSION: Cardiomegaly. There is subtle increase in interstitial markings in both lower lung fields which may suggest minimal scarring or mild interstitial  pneumonia or interstitial edema. There are no signs of alveolar pulmonary edema. There is no focal pulmonary consolidation. Electronically Signed   By: Elmer Picker M.D.   On: 12/12/2021 09:51    Procedures Procedures    Medications Ordered in ED Medications  furosemide (LASIX) injection 40 mg (has no administration in time range)    ED Course/ Medical Decision Making/ A&P                           Medical Decision Making Amount and/or Complexity of Data Reviewed Labs: ordered. Radiology:  ordered.  Risk Prescription drug management.   [name]. is here with shortness of breath, cough, nasal congestion.  Patient is actually asymptomatic now.  No shortness of breath or chest pain.  He has a history of CAD, ischemic cardiomyopathy, COPD.  He is not having any pain.  He is well-appearing.  Normal room air oxygenation.  No increased work of breathing.  A little bit of swelling in his legs.  Differential diagnosis likely some mild fluid overload/heart failure exacerbation, could be viral process or pneumonia, seems less likely to be ACS.  EKG shows sinus rhythm.  No ischemic changes.  We will get CBC, BNP, troponin, chest x-ray, viral testing and reevaluate.  Per my review and interpretation of labs patient with BNP of 1600, troponin at baseline at 26.  COVID test negative.  Lab work otherwise shows no significant anemia or electrolyte abnormality or kidney injury.  Chest x-ray with maybe a little bit of sign of volume overload.  Overall he appears very well.  He is not currently on a diuretic.  We will give him a dose IV Lasix.  He is not having any increased work of breathing or oxygen requirement.  Shared decision making was made to put him on some Lasix for a few days and have him follow-up with cardiology.  He prefers this plan.  He understands return precautions.  Discharged in good condition.  This chart was dictated using voice recognition software.  Despite best efforts to  proofread,  errors can occur which can change the documentation meaning.          Final Clinical Impression(s) / ED Diagnoses Final diagnoses:  Shortness of breath    Rx / DC Orders ED Discharge Orders          Ordered    furosemide (LASIX) 20 MG tablet  Daily        12/12/21 1040              [place], Quita Skye, DO 12/12/21 1041

## 2021-12-12 NOTE — ED Notes (Signed)
Provider states that the patients pacemaker does not need to be interagated

## 2021-12-12 NOTE — ED Triage Notes (Signed)
Reported the past 2-3 night he wakes up gasping for breathe; reported he does have a defibrillator and hx of triple bypass.

## 2021-12-17 ENCOUNTER — Encounter: Payer: Self-pay | Admitting: Family Medicine

## 2021-12-17 ENCOUNTER — Ambulatory Visit (INDEPENDENT_AMBULATORY_CARE_PROVIDER_SITE_OTHER): Payer: Medicare Other | Admitting: Family Medicine

## 2021-12-17 VITALS — BP 147/79 | HR 69 | Temp 98.2°F | Ht 70.0 in | Wt 149.4 lb

## 2021-12-17 DIAGNOSIS — I5031 Acute diastolic (congestive) heart failure: Secondary | ICD-10-CM | POA: Diagnosis not present

## 2021-12-17 DIAGNOSIS — I255 Ischemic cardiomyopathy: Secondary | ICD-10-CM | POA: Diagnosis not present

## 2021-12-17 DIAGNOSIS — I10 Essential (primary) hypertension: Secondary | ICD-10-CM

## 2021-12-17 MED ORDER — BISOPROLOL FUMARATE 5 MG PO TABS: 5.0000 mg | ORAL_TABLET | Freq: Every day | ORAL | 0 refills | Status: DC

## 2021-12-17 NOTE — Progress Notes (Addendum)
OFFICE VISIT  12/17/2021  CC:  Chief Complaint  Patient presents with   Follow-up    ED    HPI:    Patient is a 76 y.o. male who presents for emergency department follow-up. Patient was seen 12/12/2021 (5 days ago) at [facility] emergency department for shortness of breath x 3d.  He had some cold symptoms.  BP 323-557 systolic, 32-202 diast. Symptoms had resolved in the ED per ED records.  He was not hypoxic. Chest x-ray showed subtle interstitial changes which could have been edema versus scarring versus infection.  His BNP was 1600.  Other lab work negative.  He was given a dose of IV Lasix.  He was discharged home with some Lasix '40mg'$  x 4d.  INTERIM HX: Not having SOB anymore. Urinating a lot/approp. No DOE or CP.  Had orthopnea prior to ED visit but none since.  Past Medical History:  Diagnosis Date   Abdominal aortic aneurysm ([place]) 01/2021   4 cm, infrarenal.  Also enlarged left common iliac artery--vascular eval 03/2021->recommended u/s rpt 1 yr.   Anxiety    Atrial fibrillation ([place])    Limited episode, emergency room, June, 2013, spontaneous conversion to sinus rhythm, was evaluated By [name] 2013- no further visits.  Post-op (CABG) a-fib, converted on amio but pt self d/c'd this med due to DOE/side effect profile.   CAD (coronary artery disease) 05/2016   a. 05/2016: NSTEMI with cath showing 3-vessel disease. Underwent CABG on 05/12/16 with LIMA-D1, SVG-LAD, and SVG-PDA.    COPD (chronic obstructive pulmonary disease) ([place]) fall 2016   Bullous changes noted on lower lung images of CT abd done by urology   Diverticulitis    Double vision 01/2020   MG ab w/u NEG.  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 Unable to have MRI due to having shrapnel in forehead.   Hyperlipidemia    statin intolerant. Pt declined advanced lipid clinic referral 08/2019, 03/2020, and 01/2021.   Hypertension    Ischemic cardiomyopathy 05/2016   EF 20-25% at the time of NSTEMI/CABG.  Repeat EF 07/2016 30-35%.  07/2017 EF 25-30%, pt intol of entresto, bidil, and BB--for ICD 03/2019.   Microscopic hematuria Fall 2016   CT nl except nonobstructing stones.  Cystoscopy normal 12/30/14 (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  Urol ([name]) at [facility]; [name] referred him to [name] 03/2015: patient got bilat nerve sparing , robot assisted laparoscopic radical prostatectomy and pelvic lymphadenectomy.  Urol f/u, PSA surveillance showing very mild uptrend of PSA but not to the level of biochemical recurrence as of 05/2021 urology follow-up   RLS (restless legs syndrome)    Tobacco dependence    down to 1-2 cigs per day as of 11/2015.  Restarted 08/2016.   Urinary retention 05/2015   occurred s/p foley removal    Past Surgical History:  Procedure Laterality Date  aortic ultrasound  07/2012   2014 NO AAA.  01/2021-->4 cm AAA with R common iliac ectasia-->vasc surg ref   COLONOSCOPY  approx 2011 per pt   Done by surgeon in [place] after a bout of diverticulitis; normal per pt report--was told to repeat in 10 yrs.   CORONARY ARTERY BYPASS GRAFT N/A 05/12/2016   Procedure: CORONARY ARTERY BYPASS GRAFTING (CABG) TIMES THREE USING LEFT INTERNAL MAMMARY ARTERY AND RIGHT SAPHENOUS LEG VEIN HARVESTED ENDOSCOPICALLY;  Surgeon: [name], MD;  Location: [facility];  Service: Open Heart Surgery;  Laterality: N/A;   ICD IMPLANT N/A  04/12/2019   Procedure: ICD IMPLANT;  Surgeon: Evans Lance, MD;  Location: [facility];  Service: Cardiovascular;  Laterality: N/A;   INGUINAL HERNIA REPAIR  2003 and 2004   Both sides.   LAPAROSCOPY  2017   LEFT HEART CATH AND CORONARY ANGIOGRAPHY N/A 05/09/2016   Procedure: Left Heart Cath and Coronary Angiography;  Surgeon: [name], MD;  Location: [facility];  Service: Cardiovascular;  Laterality: N/A;   LITHOTRIPSY  2004   Dr. [place] Pink in [place].   LYMPHADENECTOMY Bilateral 04/30/2015   Procedure: PELVIC LYMPHADENECTOMY;  Surgeon: [name], MD;  Location: [facility];  Service: Urology;  Laterality: Bilateral;   PROSTATE BIOPSY  02/05/2015   Prostate adenocarcinoma: pt considering treatment options as of 02/19/15   ROBOT ASSISTED LAPAROSCOPIC RADICAL PROSTATECTOMY N/A 04/30/2015   Procedure: XI ROBOTIC ASSISTED LAPAROSCOPIC RADICAL PROSTATECTOMY LEVEL 2;  Surgeon: [name], MD;  Location: [facility];  Service: Urology;  Laterality: N/A;   TEE WITHOUT CARDIOVERSION N/A 05/12/2016   Procedure: TRANSESOPHAGEAL ECHOCARDIOGRAM (TEE);  Surgeon: [name], MD;  Location: [facility];  Service: Open Heart Surgery;  Laterality: N/A;   TRANSTHORACIC ECHOCARDIOGRAM  03/22/2006; 05/2016; 07/2016; 07/2017; 01/02/20   2008: EF 65%, normal valves, no wall motion abnormalties, LA size normal.  05/2016 in context of non-STEMI, EF 20-25%, mild AV and MV regurg.  08/05/16: EF 30-35%, akinesis of mid lateral myoc, grd II DD, mild aortic 07/2017: EF 25-30%, mult areas of akinesis, grd I DD. 01/2020 EF 25-30%, sev LV dsfxn, valves fine, aortic root 80m    Outpatient Medications Prior to Visit  Medication Sig Dispense Refill   ALPRAZolam (XANAX) 0.25 MG tablet tAKE 1-2 tabLETS BY MOUTH AT BEDTIME AS NEEDED FOR insomnia/restless legs 60 tablet 5   aspirin EC 81 MG tablet Take 1 tablet (81 mg total) by mouth daily. Swallow whole. 90 tablet 3   benazepril (LOTENSIN) 20 MG tablet Take 1 tablet  (20 mg total) by mouth daily. 1 tab po bid 180 tablet 3   Omega-3 Fatty Acids (FISH OIL) 1000 MG CAPS Take 1,000 mg by mouth daily.      Polyethyl Glycol-Propyl Glycol (LUBRICANT EYE DROPS) 0.4-0.3 % SOLN Place 1 drop into both eyes 3 (three) times daily as needed (dry/irritated eyes.).     Vitamin D, Cholecalciferol, 400 units CAPS Take 400 Units by mouth daily.      furosemide (LASIX) 20 MG tablet Take 2 tablets (40 mg total) by mouth daily for 4 days. 8 tablet 0   ondansetron (ZOFRAN) 4 MG tablet Take 1 tablet (4 mg total) by mouth every 8 (eight) hours as needed for nausea or vomiting. (Patient not taking: Reported on 06/17/2021) 20 tablet 1   bisoprolol (ZEBETA) 5 MG tablet Take 1 tablet (5 mg total) by mouth daily. (Patient not taking: Reported on 12/17/2021) 30 tablet 0  No facility-administered medications prior to visit.    Allergies  Allergen Reactions   Entresto [Sacubitril-Valsartan] Hives and Shortness Of Breath   Amlodipine Swelling    LE swelling   Contrast Media [Iodinated Contrast Media] Other (See Comments)    Prostate problem had to wear a catheter for two weeks after having dye.    Hydralazine Palpitations and Other (See Comments)    Fatigue+    ROS As per HPI  PE:    12/17/2021    2:01 PM 12/17/2021    1:43 PM 12/12/2021   10:45 AM  Vitals with BMI  Height  '5\' 10"'$    Weight  149 lbs 6 oz   BMI  02.63   Systolic 785 885 027  Diastolic 79 91 92  Pulse  69 70     Physical Exam  Gen: Alert, well appearing.  Patient is oriented to person, place, time, and situation. AFFECT: pleasant, lucid thought and speech. Cardiovascular: Regular rhythm and rate with a moderate amount of ectopy.  No murmur.  Question fixed split S2.  No rub. Lungs are clear to auscultation bilaterally.  Breathing is nonlabored.  Aeration is good. Extremities: Right lower extremity with 2+ pitting edema (history of vein harvest in this leg), left leg with no edema.  LABS:  Last  CBC Lab Results  Component Value Date   WBC 6.7 12/12/2021   HGB 16.0 12/12/2021   HCT 48.5 12/12/2021   MCV 97.4 12/12/2021   MCH 32.1 12/12/2021   RDW 14.3 12/12/2021   PLT 131 (L) 74/12/8784   Last metabolic panel Lab Results  Component Value Date   GLUCOSE 151 (H) 12/12/2021   NA 138 12/12/2021   K 3.8 12/12/2021   CL 106 12/12/2021   CO2 25 12/12/2021   BUN 22 12/12/2021   CREATININE 0.93 12/12/2021   GFRNONAA >60 12/12/2021   CALCIUM 9.1 12/12/2021   PROT 6.8 12/12/2021   ALBUMIN 4.1 12/12/2021   LABGLOB 2.2 09/16/2016   AGRATIO 2.0 09/16/2016   BILITOT 1.2 12/12/2021   ALKPHOS 63 12/12/2021   AST 29 12/12/2021   ALT 24 12/12/2021   ANIONGAP 7 12/12/2021   BNP    Component Value Date/Time   BNP 1,654.7 (H) 12/12/2021 0934   IMPRESSION AND PLAN:  Uncontrolled hypertension with acute congestive heart failure. He has a long history of essentially asymptomatic ischemic cardiomyopathy/systolic dysfunction.  He has responded well to Lasix.  Weight is down 7 pounds since his ED visit 5 days ago. BNP and BMET today. Start bisoprolol '5mg'$  qd.  Cont benazepril '20mg'$  bid.  Patient has history of intolerance to blood pressure of less than 130/70. OK to stay off lasix at this time.  Patient states that there is plan for repeat echo already in place for January 2024 but I do not see any order for this in EMR.  We will have to clarify this when I see him again in 1 week.  An After Visit Summary was printed and given to the patient.  FOLLOW UP: Return in about 1 week (around 12/24/2021) for f/u bp.  Signed:  Crissie Sickles, MD           12/17/2021

## 2021-12-18 LAB — BASIC METABOLIC PANEL WITH GFR
BUN: 24 mg/dL (ref 7–25)
CO2: 25 mmol/L (ref 20–32)
Calcium: 9.7 mg/dL (ref 8.6–10.3)
Chloride: 102 mmol/L (ref 98–110)
Creat: 1.1 mg/dL (ref 0.70–1.28)
Glucose, Bld: 88 mg/dL (ref 65–99)
Potassium: 4.5 mmol/L (ref 3.5–5.3)
Sodium: 139 mmol/L (ref 135–146)

## 2021-12-18 LAB — BRAIN NATRIURETIC PEPTIDE: Brain Natriuretic Peptide: 1361 pg/mL — ABNORMAL HIGH (ref <100)

## 2021-12-22 ENCOUNTER — Ambulatory Visit (INDEPENDENT_AMBULATORY_CARE_PROVIDER_SITE_OTHER): Payer: Medicare Other | Admitting: Family Medicine

## 2021-12-22 ENCOUNTER — Encounter: Payer: Self-pay | Admitting: Family Medicine

## 2021-12-22 ENCOUNTER — Encounter: Payer: Self-pay | Admitting: Nurse Practitioner

## 2021-12-22 ENCOUNTER — Ambulatory Visit: Payer: Medicare Other | Attending: Nurse Practitioner | Admitting: Nurse Practitioner

## 2021-12-22 VITALS — BP 130/68 | HR 87 | Ht 71.0 in | Wt 151.2 lb

## 2021-12-22 VITALS — BP 130/88 | HR 75 | Temp 97.5°F | Wt 153.0 lb

## 2021-12-22 DIAGNOSIS — I499 Cardiac arrhythmia, unspecified: Secondary | ICD-10-CM

## 2021-12-22 DIAGNOSIS — I25118 Atherosclerotic heart disease of native coronary artery with other forms of angina pectoris: Secondary | ICD-10-CM | POA: Diagnosis not present

## 2021-12-22 DIAGNOSIS — R0600 Dyspnea, unspecified: Secondary | ICD-10-CM

## 2021-12-22 DIAGNOSIS — R9431 Abnormal electrocardiogram [ECG] [EKG]: Secondary | ICD-10-CM

## 2021-12-22 DIAGNOSIS — I10 Essential (primary) hypertension: Secondary | ICD-10-CM | POA: Diagnosis not present

## 2021-12-22 DIAGNOSIS — I5041 Acute combined systolic (congestive) and diastolic (congestive) heart failure: Secondary | ICD-10-CM | POA: Diagnosis not present

## 2021-12-22 DIAGNOSIS — I5042 Chronic combined systolic (congestive) and diastolic (congestive) heart failure: Secondary | ICD-10-CM | POA: Diagnosis not present

## 2021-12-22 DIAGNOSIS — I5043 Acute on chronic combined systolic (congestive) and diastolic (congestive) heart failure: Secondary | ICD-10-CM

## 2021-12-22 MED ORDER — FUROSEMIDE 20 MG PO TABS: ORAL_TABLET | ORAL | 0 refills | Status: DC

## 2021-12-22 MED ORDER — DAPAGLIFLOZIN PROPANEDIOL 10 MG PO TABS: 10.0000 mg | ORAL_TABLET | Freq: Every day | ORAL | 6 refills | Status: DC

## 2021-12-22 NOTE — Patient Instructions (Signed)
Medication Instructions:   START Wilder Glade one (1) tablet by mouth ( 10 mg ) daily.   *If you need a refill on your cardiac medications before your next appointment, please call your pharmacy*   Lab Work:  None ordered.  If you have labs (blood work) drawn today and your tests are completely normal, you will receive your results only by: [facility] (if you have MyChart) OR A paper copy in the mail If you have any lab test that is abnormal or we need to change your treatment, we will call you to review the results.   Testing/Procedures:  None ordered.   Follow-Up: At [facility], you and your health needs are our priority.  As part of our continuing mission to provide you with exceptional heart care, we have created designated Provider Care Teams.  These Care Teams include your primary Cardiologist (physician) and Advanced Practice Providers (APPs -  Physician Assistants and Nurse Practitioners) who all work together to provide you with the care you need, when you need it.  We recommend signing up for the patient portal called "MyChart".  Sign up information is provided on this After Visit Summary.  MyChart is used to connect with patients for Virtual Visits (Telemedicine).  Patients are able to view lab/test results, encounter notes, upcoming appointments, etc.  Non-urgent messages can be sent to your provider as well.   To learn more about what you can do with MyChart, go to [email or web address].    Your next appointment:   3 month(s)  The format for your next appointment:   In Person  Provider:   [name], MD     Important Information About Sugar

## 2021-12-22 NOTE — Progress Notes (Signed)
OFFICE VISIT  12/22/2021  CC:  Chief Complaint  Patient presents with   Hypertension    Patient is a 76 y.o. male who presents for 5d f/u HTN and CHF. A/P as of last visit: "Uncontrolled hypertension with acute congestive heart failure. He has a long history of essentially asymptomatic ischemic cardiomyopathy/systolic dysfunction.   He has responded well to Lasix.  Weight is down 7 pounds since his ED visit 5 days ago. BNP and BMET today. Start bisoprolol '5mg'$  qd.  Cont benazepril '20mg'$  bid.  Patient has history of intolerance to blood pressure of less than 130/70. OK to stay off lasix at this time.   Patient states that there is plan for repeat echo already in place for January 2024 but I do not see any order for this in EMR.  We will have to clarify this when I see him again in 1 week."  INTERIM HX: BNP level improved last week but was still significantly elevated.  Electrolytes and creatinine were normal.  Overall patient seems to have continued to have orthopnea and intermittent feeling of fatigue and mild dyspnea with normal activities. Seems worse the last couple of days. Home blood pressures around 160s over 90s. Pressure here is 130/88.  Past Medical History:  Diagnosis Date   Abdominal aortic aneurysm ([place]) 01/2021   4 cm, infrarenal.  Also enlarged left common iliac artery--vascular eval 03/2021->recommended u/s rpt 1 yr.   Anxiety    Atrial fibrillation ([place])    Limited episode, emergency room, June, 2013, spontaneous conversion to sinus rhythm, was evaluated By [name] 2013- no further visits.  Post-op (CABG) a-fib, converted on amio but pt self d/c'd this med due to DOE/side effect profile.   CAD (coronary artery disease) 05/2016   a. 05/2016: NSTEMI with cath showing 3-vessel disease. Underwent CABG on 05/12/16 with LIMA-D1, SVG-LAD, and SVG-PDA.    COPD (chronic obstructive pulmonary disease) ([place]) fall 2016   Bullous changes noted on lower lung images of CT abd  done by urology   Diverticulitis    Double vision 01/2020   MG ab w/u NEG.  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 Unable to have MRI due to having shrapnel in forehead.   Hyperlipidemia    statin intolerant. Pt declined advanced lipid clinic referral 08/2019, 03/2020, and 01/2021.   Hypertension    Ischemic cardiomyopathy 05/2016   EF 20-25% at the time of NSTEMI/CABG.  Repeat EF 07/2016 30-35%.  07/2017 EF 25-30%, pt intol of entresto, bidil, and BB--for ICD 03/2019.   Microscopic hematuria Fall 2016   CT nl except nonobstructing stones.  Cystoscopy normal 12/30/14 (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  Urol ([name]) at [facility]; [name] referred him to [name] 03/2015: patient got bilat nerve sparing , robot assisted laparoscopic radical prostatectomy and pelvic lymphadenectomy.  Urol f/u, PSA surveillance showing very mild uptrend of PSA but not to the  level of biochemical recurrence as of 05/2021 urology follow-up   RLS (restless legs syndrome)    Tobacco dependence    down to 1-2 cigs per day as of 11/2015.  Restarted 08/2016.   Urinary retention 05/2015   occurred s/p foley removal    Past Surgical History:  Procedure Laterality Date   aortic ultrasound  07/2012   2014 NO AAA.  01/2021-->4 cm AAA with R common iliac ectasia-->vasc surg ref   COLONOSCOPY  approx 2011 per pt   Done by surgeon in [place] after a bout of diverticulitis;  normal per pt report--was told to repeat in 10 yrs.   CORONARY ARTERY BYPASS GRAFT N/A 05/12/2016   Procedure: CORONARY ARTERY BYPASS GRAFTING (CABG) TIMES THREE USING LEFT INTERNAL MAMMARY ARTERY AND RIGHT SAPHENOUS LEG VEIN HARVESTED ENDOSCOPICALLY;  Surgeon: [name], MD;  Location: [place];  Service: Open Heart Surgery;  Laterality: N/A;   ICD IMPLANT N/A 04/12/2019   Procedure: ICD IMPLANT;  Surgeon: Evans Lance, MD;  Location: [place] [facility];  Service: Cardiovascular;  Laterality: N/A;   INGUINAL HERNIA REPAIR  2003 and 2004   Both sides.   LAPAROSCOPY  2017   LEFT HEART CATH AND CORONARY ANGIOGRAPHY N/A 05/09/2016   Procedure: Left Heart Cath and Coronary Angiography;  Surgeon: [name], MD;  Location: [place] [facility];  Service: Cardiovascular;  Laterality: N/A;   LITHOTRIPSY  2004   Dr. [place] Pink in [place].   LYMPHADENECTOMY Bilateral 04/30/2015   Procedure: PELVIC LYMPHADENECTOMY;  Surgeon: [name], MD;  Location: [facility];  Service: Urology;  Laterality: Bilateral;   PROSTATE BIOPSY  02/05/2015   Prostate adenocarcinoma: pt considering treatment options as of 02/19/15   ROBOT ASSISTED LAPAROSCOPIC RADICAL PROSTATECTOMY N/A 04/30/2015   Procedure: XI ROBOTIC ASSISTED LAPAROSCOPIC RADICAL PROSTATECTOMY LEVEL 2;  Surgeon: [name], MD;  Location: [facility];  Service: Urology;  Laterality: N/A;   TEE WITHOUT CARDIOVERSION N/A 05/12/2016   Procedure: TRANSESOPHAGEAL ECHOCARDIOGRAM (TEE);  Surgeon: [name], MD;  Location: [place];  Service: Open Heart Surgery;  Laterality: N/A;   TRANSTHORACIC ECHOCARDIOGRAM  03/22/2006; 05/2016; 07/2016; 07/2017; 01/02/20   2008: EF 65%, normal valves, no wall motion abnormalties, LA size normal.  05/2016 in context of non-STEMI, EF 20-25%, mild AV and MV regurg.  08/05/16: EF 30-35%, akinesis of mid lateral myoc, grd II DD, mild aortic 07/2017: EF 25-30%, mult areas of akinesis, grd I DD. 01/2020 EF 25-30%, sev LV dsfxn,  valves fine, aortic root 37m    Outpatient Medications Prior to Visit  Medication Sig Dispense Refill   ALPRAZolam (XANAX) 0.25 MG tablet tAKE 1-2 tabLETS BY MOUTH AT BEDTIME AS NEEDED FOR insomnia/restless legs 60 tablet 5   aspirin EC 81 MG tablet Take 1 tablet (81 mg total) by mouth daily. Swallow whole. 90 tablet 3   benazepril (LOTENSIN) 20 MG tablet Take 1 tablet (20 mg total) by mouth daily. 1 tab po bid 180 tablet 3   bisoprolol (ZEBETA) 5 MG tablet Take 1 tablet (5 mg total) by mouth daily. 30 tablet 0   Omega-3 Fatty Acids (FISH OIL) 1000 MG CAPS Take 1,000 mg by mouth daily.      ondansetron (ZOFRAN) 4 MG tablet Take 1 tablet (4 mg total) by mouth every 8 (eight) hours as needed for nausea or vomiting. 20 tablet 1   Polyethyl Glycol-Propyl Glycol (LUBRICANT EYE DROPS) 0.4-0.3 % SOLN Place 1 drop into both eyes 3 (three) times daily as  needed (dry/irritated eyes.).     Vitamin D, Cholecalciferol, 400 units CAPS Take 400 Units by mouth daily.      furosemide (LASIX) 20 MG tablet Take 2 tablets (40 mg total) by mouth daily for 4 days. 8 tablet 0   No facility-administered medications prior to visit.    Allergies  Allergen Reactions   Entresto [Sacubitril-Valsartan] Hives and Shortness Of Breath   Amlodipine Swelling    LE swelling   Contrast Media [Iodinated Contrast Media] Other (See Comments)    Prostate problem had to wear a catheter for two weeks after having dye.    Hydralazine Palpitations and Other (See Comments)    Fatigue+   ROS As per HPI  PE:    12/22/2021    8:49 AM 12/17/2021    2:01 PM 12/17/2021    1:43 PM  Vitals with BMI  Height   '5\' 10"'$   Weight 153 lbs  149 lbs 6 oz  BMI 74.12  87.86  Systolic 767 209 470  Diastolic 88 79 91  Pulse 75  69    Physical Exam  Gen: Alert, well appearing.  Patient is oriented to person, place, time, and situation. AFFECT: pleasant, lucid thought and speech. CV: irregular irregular rhythm vs regular rhythm with  frequent ectopy, rate 70s, no murmur or rub Chest is clear, no wheezing or rales. Normal symmetric air entry throughout both lung fields. No chest wall deformities or tenderness. EXT: no clubbing or cyanosis.  1+ bilat LE pittin edema.    LABS:  Last CBC Lab Results  Component Value Date   WBC 6.7 12/12/2021   HGB 16.0 12/12/2021   HCT 48.5 12/12/2021   MCV 97.4 12/12/2021   MCH 32.1 12/12/2021   RDW 14.3 12/12/2021   PLT 131 (L) 96/28/3662   Last metabolic panel Lab Results  Component Value Date   GLUCOSE 88 12/17/2021   NA 139 12/17/2021   K 4.5 12/17/2021   CL 102 12/17/2021   CO2 25 12/17/2021   BUN 24 12/17/2021   CREATININE 1.10 12/17/2021   GFRNONAA >60 12/12/2021   CALCIUM 9.7 12/17/2021   PROT 6.8 12/12/2021   ALBUMIN 4.1 12/12/2021   LABGLOB 2.2 09/16/2016   AGRATIO 2.0 09/16/2016   BILITOT 1.2 12/12/2021   ALKPHOS 63 12/12/2021   AST 29 12/12/2021   ALT 24 12/12/2021   ANIONGAP 7 12/12/2021   12 lead EKG today: atrial rhythm with PVCs, rate 76, IVCD, TWI inferolaterally. Compared to 12/12/21 TWIs new.  IMPRESSION AND PLAN:  #1 congestive heart failure, acute on chronic. His dyspnea on exertion seems to have returned since last visit, significant orthopnea. Stop bisoprolol (which I started 5 d/a b/c I felt like he was out of the acute CHF phase). His EKG today shows some changes that may indicate ischemia.  He does have history of coronary artery disease and is status post CABG back in 2018.  Currently not having any chest pain.  I feel like he needs evaluation by his cardiologist or at the emergency department. Fortunately, his cardiology office was kind enough to get him seen this afternoon. No labs here today. We did discuss restart of Lasix 40 mg twice daily and he will stop bisoprolol.  #2 uncontrolled hypertension. For now, continue benazepril 20 mg twice daily.  An After Visit Summary was printed and given to the patient.  FOLLOW UP: TBD  based on cardiology eval today  Signed:  Crissie Sickles, MD  12/22/2021  

## 2021-12-22 NOTE — Progress Notes (Unsigned)
Cardiology Office Note:    Date:  12/23/2021   ID:  Daniel Sandhoff., DOB [birth year], MRN [id number]  PCP:  [name], MD   [facility] Providers Cardiologist:  [name], MD Electrophysiologist:  Cristopher Peru, MD     Referring MD: [name], MD   Chief Complaint: CHF exacerbation   History of Present Illness:    Daniel Strollo. is a very pleasant  76 y.o. male with a hx of CAD S/P CABG 2018 (LIMA-D1, SVG-LAD, SVG-PDA) hypertension, ischemic cardiomyopathy, HFrEF s/p ICD, intolerance of BP < 130/70, COPD, hyperlipidemia intolerant to statin.   Has been followed by Dr. Percival Spanish and Dr. Lovena Le and was last seen in cardiology clinic 10/27/21 by Dr. Percival Spanish.   He was a late add-on to my schedule today. He is here with his son for evaluation of CHF per recommendation from PCP. Seen by PCP on 12/17/21 for ED follow-up and again today with weight down 7 pounds from ED visit on 12/12/2021. He continues to wake up gasping for air and getting short of breath walking a short distance. Symptoms started 3 weeks ago.  Limits sodium but his son feels that he does not eat enough during the day. He stays well-hydrated. No edema or chest pain. Is able to fall asleep in a reclined position without difficulty but wakes up gasping and is unable to go back to sleep. Has been up since 0100 today.   Past Medical History:  Diagnosis Date   Abdominal aortic aneurysm ([place]) 01/2021   4 cm, infrarenal.  Also enlarged left common iliac artery--vascular eval 03/2021->recommended u/s rpt 1 yr.   Anxiety    Atrial fibrillation ([place])    Limited episode, emergency room, June, 2013, spontaneous conversion to sinus rhythm, was evaluated By [name] 2013- no further visits.  Post-op (CABG) a-fib, converted on amio but pt self d/c'd this med due to DOE/side effect profile.   CAD (coronary artery disease) 05/2016   a. 05/2016: NSTEMI with cath showing 3-vessel disease. Underwent CABG on 05/12/16  with LIMA-D1, SVG-LAD, and SVG-PDA.    COPD (chronic obstructive pulmonary disease) ([place]) fall 2016   Bullous changes noted on lower lung images of CT abd done by urology   Diverticulitis    Double vision 01/2020   MG ab w/u NEG.  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 Unable to have MRI due to having shrapnel in forehead.   Hyperlipidemia    statin intolerant. Pt declined advanced lipid clinic referral 08/2019, 03/2020, and 01/2021.   Hypertension    Ischemic cardiomyopathy 05/2016   EF 20-25% at the time of NSTEMI/CABG.  Repeat EF 07/2016 30-35%.  07/2017 EF 25-30%, pt intol of entresto, bidil, and BB--for ICD 03/2019.   Microscopic hematuria Fall 2016   CT nl except nonobstructing stones.  Cystoscopy normal 12/30/14 (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  Urol ([name])  at [facility]; [name] referred him to [name] 03/2015: patient got bilat nerve sparing , robot assisted laparoscopic radical prostatectomy and pelvic lymphadenectomy.  Urol f/u, PSA surveillance showing very mild uptrend of PSA but not to the level of biochemical recurrence as of 05/2021 urology follow-up   RLS (restless legs syndrome)    Tobacco dependence    down to 1-2 cigs per day as of 11/2015.  Restarted 08/2016.   Urinary retention 05/2015   occurred s/p foley removal    Past Surgical History:  Procedure Laterality Date   aortic ultrasound  07/2012   2014 NO AAA.   01/2021-->4 cm AAA with R common iliac ectasia-->vasc surg ref   COLONOSCOPY  approx 2011 per pt   Done by surgeon in [place] after a bout of diverticulitis; normal per pt report--was told to repeat in 10 yrs.   CORONARY ARTERY BYPASS GRAFT N/A 05/12/2016   Procedure: CORONARY ARTERY BYPASS GRAFTING (CABG) TIMES THREE USING LEFT INTERNAL MAMMARY ARTERY AND RIGHT SAPHENOUS LEG VEIN HARVESTED ENDOSCOPICALLY;  Surgeon: [name], MD;  Location: [facility];  Service: Open Heart Surgery;  Laterality: N/A;   ICD IMPLANT N/A 04/12/2019   Procedure: ICD IMPLANT;  Surgeon: Evans Lance, MD;  Location: [place] [facility];  Service: Cardiovascular;  Laterality: N/A;   INGUINAL HERNIA REPAIR  2003 and 2004   Both sides.   LAPAROSCOPY  2017   LEFT HEART CATH AND CORONARY ANGIOGRAPHY N/A 05/09/2016   Procedure: Left Heart Cath and Coronary Angiography;  Surgeon: [name], MD;  Location: [facility] [facility];  Service: Cardiovascular;  Laterality: N/A;   LITHOTRIPSY  2004   Dr. [place] Pink in [place].   LYMPHADENECTOMY Bilateral 04/30/2015   Procedure: PELVIC LYMPHADENECTOMY;  Surgeon: [name], MD;  Location: [facility];  Service: Urology;  Laterality: Bilateral;   PROSTATE BIOPSY  02/05/2015   Prostate adenocarcinoma: pt considering treatment options as of 02/19/15   ROBOT ASSISTED LAPAROSCOPIC RADICAL PROSTATECTOMY N/A 04/30/2015   Procedure: XI ROBOTIC ASSISTED LAPAROSCOPIC RADICAL PROSTATECTOMY LEVEL 2;  Surgeon: [name], MD;  Location: [facility];  Service: Urology;  Laterality: N/A;   TEE WITHOUT CARDIOVERSION N/A 05/12/2016   Procedure: TRANSESOPHAGEAL ECHOCARDIOGRAM (TEE);  Surgeon: [name], MD;  Location: [facility];  Service: Open Heart Surgery;  Laterality: N/A;   TRANSTHORACIC ECHOCARDIOGRAM  03/22/2006; 05/2016; 07/2016; 07/2017; 01/02/20   2008: EF 65%, normal valves, no wall motion abnormalties, LA size normal.  05/2016 in context of non-STEMI, EF 20-25%, mild AV and MV regurg.   08/05/16: EF 30-35%, akinesis of mid lateral myoc, grd II DD, mild aortic 07/2017: EF 25-30%, mult areas of akinesis, grd I DD. 01/2020 EF 25-30%, sev LV dsfxn, valves fine, aortic root 25m    Current Medications: Current Meds  Medication Sig   ALPRAZolam (XANAX) 0.25 MG tablet tAKE 1-2 tabLETS BY MOUTH AT BEDTIME AS NEEDED FOR insomnia/restless legs   aspirin EC 81 MG tablet Take 1 tablet (81 mg total) by mouth daily. Swallow whole.   benazepril (LOTENSIN) 20 MG tablet Take 1 tablet (20 mg total) by mouth daily. 1 tab po bid   dapagliflozin propanediol (FARXIGA) 10 MG TABS tablet Take 1 tablet (10 mg total) by mouth daily before breakfast.   furosemide (LASIX) 20 MG tablet 2 tabs po bid (Patient taking differently: Take 20 mg by mouth 2 (two) times daily. 2 tabs po bid)   Omega-3 Fatty Acids (FISH OIL) 1000 MG CAPS Take 1,000 mg  by mouth daily.    ondansetron (ZOFRAN) 4 MG tablet Take 1 tablet (4 mg total) by mouth every 8 (eight) hours as needed for nausea or vomiting.   Polyethyl Glycol-Propyl Glycol (LUBRICANT EYE DROPS) 0.4-0.3 % SOLN Place 1 drop into both eyes 3 (three) times daily as needed (dry/irritated eyes.).   Vitamin D, Cholecalciferol, 400 units CAPS Take 400 Units by mouth daily.      Allergies:   Entresto [sacubitril-valsartan], Amlodipine, Contrast media [iodinated contrast media], and Hydralazine   Social History   Socioeconomic History   Marital status: Married    Spouse name: Not on file   Number of children: Not on file   Years of education: Not on file   Highest education level: Not on file  Occupational History   Not on file  Tobacco Use   Smoking status: Some Days    Packs/day: 1.00    Years: 55.00    Total pack years: 55.00    Types: Cigarettes   Smokeless tobacco: Never   Tobacco comments:    quit in 2017  Vaping Use   Vaping Use: Never used  Substance and Sexual Activity   Alcohol use: No   Drug use: No   Sexual activity: Not on file  Other Topics  Concern   Not on file  Social History Narrative   Married, 2 grown children.   HS education.  Orig from [place], lives there now.   Occupation: maintenance.  Drives a tractor a lot, drives a pickup truck a lot, rides horses a lot.   Tobacco: 20 pack-yr hx (current as of 07/2012)   No drugs.   Alcohol: none   Social Determinants of Health   Financial Resource Strain: Low Risk  (09/29/2021)   Overall Financial Resource Strain (CARDIA)    Difficulty of Paying Living Expenses: Not hard at all  Food Insecurity: No Food Insecurity (09/29/2021)   Hunger Vital Sign    Worried About Running Out of Food in the Last Year: Never true    Ran Out of Food in the Last Year: Never true  Transportation Needs: No Transportation Needs (09/29/2021)   PRAPARE - Hydrologist (Medical): No    Lack of Transportation (Non-Medical): No  Physical Activity: Inactive (09/29/2021)   Exercise Vital Sign    Days of Exercise per Week: 0 days    Minutes of Exercise per Session: 0 min  Stress: No Stress Concern Present (09/29/2021)   [place]    Feeling of Stress : Not at all  Social Connections: Moderately Integrated (09/29/2021)   Social Connection and Isolation Panel [NHANES]    Frequency of Communication with Friends and Family: More than three times a week    Frequency of Social Gatherings with Friends and Family: More than three times a week    Attends Religious Services: More than 4 times per year    Active Member of [employer] or Organizations: No    Attends Music therapist: Never    Marital Status: Married     Family History: [name] family history includes Cancer in his father; Cancer - Other (age of onset: 11) in his mother; Diabetes in his sister; Hypertension in his mother.  ROS:   Please see the history of present illness.    + shortness of breath + PND All other systems reviewed and  are negative.  Labs/Other Studies Reviewed:    The following studies  were reviewed today:  Echo 01/02/20  1. Global hypokinesis with akinesis of the inferolateral wall; overall  severe LV dysfunction; no LV thrombus noted using definity.   2. Left ventricular ejection fraction, by estimation, is 20 to 25%. The  left ventricle has severely decreased function. The left ventricle  demonstrates regional wall motion abnormalities (see scoring  diagram/findings for description). The left  ventricular internal cavity size was moderately dilated. Left ventricular  diastolic parameters are indeterminate.   3. Right ventricular systolic function is normal. The right ventricular  size is normal.   4. Left atrial size was mildly dilated.   5. The mitral valve is normal in structure. No evidence of mitral valve  regurgitation. No evidence of mitral stenosis.   6. The aortic valve is tricuspid. Aortic valve regurgitation is mild.  Mild aortic valve sclerosis is present, with no evidence of aortic valve  stenosis.   7. Aortic dilatation noted. There is mild dilatation of the aortic root,  measuring 40 mm.   8. The inferior vena cava is normal in size with greater than 50%  respiratory variability, suggesting right atrial pressure of 3 mmHg.    Recent Labs: 12/12/2021: ALT 24; Hemoglobin 16.0; Platelets 131 12/17/2021: Brain Natriuretic Peptide 1,361; BUN 24; Creat 1.10; Potassium 4.5; Sodium 139  Recent Lipid Panel    Component Value Date/Time   CHOL 177 02/04/2021 1036   CHOL 137 09/16/2016 0858   TRIG 58.0 02/04/2021 1036   HDL 44.50 02/04/2021 1036   HDL 35 (L) 09/16/2016 0858   CHOLHDL 4 02/04/2021 1036   VLDL 11.6 02/04/2021 1036   LDLCALC 121 (H) 02/04/2021 1036   LDLCALC 88 09/16/2016 0858     Risk Assessment/Calculations:         Physical Exam:    VS:  BP 130/68 (BP Location: Right Arm, Patient Position: Sitting, Cuff Size: Normal)   Pulse 87   Ht _0  (1.803 m)    Wt 151 lb 3.2 oz (68.6 kg)   SpO2 97%   BMI 21.09 kg/m     Wt Readings from Last 3 Encounters:  12/22/21 151 lb 3.2 oz (68.6 kg)  12/22/21 153 lb (69.4 kg)  12/17/21 149 lb 6.4 oz (67.8 kg)     GEN:  Well nourished, well developed in no acute distress HEENT: Normal NECK: No JVD; No carotid bruits CARDIAC: RRR, no murmurs, rubs, gallops RESPIRATORY:  Clear to auscultation without rales, wheezing or rhonchi  ABDOMEN: Soft, non-tender, non-distended MUSCULOSKELETAL:  No edema; No deformity. 2+ pedal pulses, equal bilaterally SKIN: Warm and dry NEUROLOGIC:  Alert and oriented x 3 PSYCHIATRIC:  Normal affect   EKG:  EKG is not ordered today.  EKG from PCP today reviewed, no acute change from prior tracing.  Diagnoses:    1. Acute on chronic combined systolic and diastolic CHF (congestive heart failure) (HCC)   2. Paroxysmal nocturnal dyspnea    Assessment and Plan:     Acute on chronic combined CHF: Having PND for 3 weeks. Most recent echo 01/2020 showed global HK with akinesis of inferior lateral wall, overall severe LV dysfunction, EF 20 to 25%, moderately dilated LV, normal RV. Does not appear volume overloaded on exam, however he describes PND with some mild improvement on higher dose of diuretics. PCP increased Lasix today to 40 mg daily, up from 20 mg daily. He previously took a 4 day trial of higher dose diuretic some some improvement.  Has had multiple medication intolerances, including severe reaction  to [facility] and unknown intolerance of spironolactone.  We will start Farxiga 10 mg daily.  Samples given while in the office and free 30-day card provided.  Advised to be met in 1 week as well as follow-up with either PCP or cardiology.  Continue Lasix 40 mg daily. Advised patient and son to have patient take additional 20 mg Lasix if he wakes up with PND. Continue benazapril.      Disposition: 1 week follow-up with PCP or cardiology  Medication Adjustments/Labs and Tests  Ordered: Current medicines are reviewed at length with the patient today.  Concerns regarding medicines are outlined above.  No orders of the defined types were placed in this encounter.  Meds ordered this encounter  Medications   dapagliflozin propanediol (FARXIGA) 10 MG TABS tablet    Sig: Take 1 tablet (10 mg total) by mouth daily before breakfast.    Dispense:  30 tablet    Refill:  6    Patient Instructions  Medication Instructions:   START Farxiga one (1) tablet by mouth ( 10 mg ) daily.   *If you need a refill on your cardiac medications before your next appointment, please call your pharmacy*   Lab Work:  None ordered.  If you have labs (blood work) drawn today and your tests are completely normal, you will receive your results only by: [facility] (if you have MyChart) OR A paper copy in the mail If you have any lab test that is abnormal or we need to change your treatment, we will call you to review the results.   Testing/Procedures:  None ordered.   Follow-Up: At [facility], you and your health needs are our priority.  As part of our continuing mission to provide you with exceptional heart care, we have created designated Provider Care Teams.  These Care Teams include your primary Cardiologist (physician) and Advanced Practice Providers (APPs -  Physician Assistants and Nurse Practitioners) who all work together to provide you with the care you need, when you need it.  We recommend signing up for the patient portal called "MyChart".  Sign up information is provided on this After Visit Summary.  MyChart is used to connect with patients for Virtual Visits (Telemedicine).  Patients are able to view lab/test results, encounter notes, upcoming appointments, etc.  Non-urgent messages can be sent to your provider as well.   To learn more about what you can do with MyChart, go to [email or web address].    Your next appointment:   3 month(s)  The format  for your next appointment:   In Person  Provider:   [name], MD     Important Information About Sugar         Signed, Emmaline Life, NP  12/23/2021 8:51 AM    [place]

## 2021-12-23 ENCOUNTER — Encounter: Payer: Self-pay | Admitting: Nurse Practitioner

## 2021-12-29 ENCOUNTER — Telehealth (HOSPITAL_BASED_OUTPATIENT_CLINIC_OR_DEPARTMENT_OTHER): Payer: Self-pay | Admitting: *Deleted

## 2021-12-29 NOTE — Telephone Encounter (Signed)
-----   Message from Tamsen Snider sent at 12/22/2021  2:49 PM EST ----- Call Mr. Miera and with his permission we can start the process for a grant due to [place] if pt is ok with this.

## 2021-12-29 NOTE — Telephone Encounter (Signed)
Lvm for pt to call back to discuss farxiga.

## 2021-12-29 NOTE — Telephone Encounter (Signed)
Sharyn Lull stated if pt has not seen PCP or is not going to see PCP next week Sharyn Lull will see pt back next week.

## 2021-12-29 NOTE — Telephone Encounter (Signed)
Pt is returning call. Transferred to Daniel [name].

## 2021-12-29 NOTE — Telephone Encounter (Signed)
Pt is ok with proceeding with [place]. Will route this to [employer], [place] coordinator. Pt will call PCP to get appt this week. Pt is advised if does not get in with PCP to call office and Sharyn Lull will see pt next week.

## 2021-12-29 NOTE — Telephone Encounter (Signed)
Daniel [name] information is below. His pharmacy will need this information. They should bill his insurance first and then this card info  CARD NO. [id number]   CARD STATUS Active   BIN [id number]   PCN [id number]   PC GROUP [id number]

## 2021-12-29 NOTE — Telephone Encounter (Signed)
S/w Erasmo Downer at [employer] @ [phone] to give grant info for [place].   Card # [id number]  BIN#  [id number]  PCN#  [id number]  PC GROUP#  [id number]  Medication is approved with 0 [place].

## 2022-01-03 ENCOUNTER — Encounter: Payer: Self-pay | Admitting: Family Medicine

## 2022-01-03 ENCOUNTER — Ambulatory Visit (INDEPENDENT_AMBULATORY_CARE_PROVIDER_SITE_OTHER): Payer: Medicare Other | Admitting: Family Medicine

## 2022-01-03 VITALS — BP 153/75 | HR 78 | Temp 98.4°F | Ht 71.0 in | Wt 145.0 lb

## 2022-01-03 DIAGNOSIS — I5043 Acute on chronic combined systolic (congestive) and diastolic (congestive) heart failure: Secondary | ICD-10-CM | POA: Diagnosis not present

## 2022-01-03 LAB — BASIC METABOLIC PANEL
BUN: 23 mg/dL (ref 6–23)
CO2: 30 mEq/L (ref 19–32)
Calcium: 9.9 mg/dL (ref 8.4–10.5)
Chloride: 104 mEq/L (ref 96–112)
Creatinine, Ser: 1.15 mg/dL (ref 0.40–1.50)
GFR: 61.75 mL/min (ref >60.00)
Glucose, Bld: 95 mg/dL (ref 70–99)
Potassium: 5.8 mEq/L — ABNORMAL HIGH (ref 3.5–5.1)
Sodium: 139 mEq/L (ref 135–145)

## 2022-01-03 NOTE — Progress Notes (Signed)
OFFICE VISIT  01/03/2022  CC:  Chief Complaint  Patient presents with   Follow-up   Patient is a 76 y.o. male who presents for 2-week follow-up congestive heart failure. A/P as of last visit: "#1 congestive heart failure, acute on chronic. His dyspnea on exertion seems to have returned since last visit, significant orthopnea. Stop bisoprolol (which I started 5 d/a b/c I felt like he was out of the acute CHF phase). His EKG today shows some changes that may indicate ischemia.  He does have history of coronary artery disease and is status post CABG back in 2018.  Currently not having any chest pain.   I feel like he needs evaluation by his cardiologist or at the emergency department. Fortunately, his cardiology office was kind enough to get him seen this afternoon. No labs here today. We did discuss restart of Lasix 40 mg twice daily and he will stop bisoprolol.   #2 uncontrolled hypertension. For now, continue benazepril 20 mg twice daily.  INTERIM HX: He saw the cardiologist on 12/22/2021 and he was continued on Lasix 40 mg a day and started on farxiga 10 mg a day. He feels well now.  He is able to walk around without shortness of breath and he has no more orthopnea. He has no chest pain or palpitations.  No problems taking the [place].  His weight is down 6 pounds compared to 2 weeks ago.  Past Medical History:  Diagnosis Date   Abdominal aortic aneurysm ([place]) 01/2021   4 cm, infrarenal.  Also enlarged left common iliac artery--vascular eval 03/2021->recommended u/s rpt 1 yr.   Anxiety    Atrial fibrillation ([place])    Limited episode, emergency room, June, 2013, spontaneous conversion to sinus rhythm, was evaluated By [name] 2013- no further visits.  Post-op (CABG) a-fib, converted on amio but pt self d/c'd this med due to DOE/side effect profile.   CAD (coronary artery disease) 05/2016   a. 05/2016: NSTEMI with cath showing 3-vessel disease. Underwent CABG on 05/12/16 with  LIMA-D1, SVG-LAD, and SVG-PDA.    COPD (chronic obstructive pulmonary disease) ([place]) fall 2016   Bullous changes noted on lower lung images of CT abd done by urology   Diverticulitis    Double vision 01/2020   MG ab w/u NEG.  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 Unable to have MRI due to having shrapnel in forehead.   Hyperlipidemia    statin intolerant. Pt declined advanced lipid clinic referral 08/2019, 03/2020, and 01/2021.   Hypertension    Ischemic cardiomyopathy 05/2016   EF 20-25% at the time of NSTEMI/CABG.  Repeat EF 07/2016 30-35%.  07/2017 EF 25-30%, pt intol of entresto, bidil, and BB--for ICD 03/2019.   Microscopic hematuria Fall 2016   CT nl except nonobstructing stones.  Cystoscopy normal 12/30/14 (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  Urol ([name]) at [facility]; [name] referred him to  [name] 03/2015: patient got bilat nerve sparing , robot assisted laparoscopic radical prostatectomy and pelvic lymphadenectomy.  Urol f/u, PSA surveillance showing very mild uptrend of PSA but not to the level of biochemical recurrence as of 05/2021 urology follow-up   RLS (restless legs syndrome)    Tobacco dependence    down to 1-2 cigs per day as of 11/2015.  Restarted 08/2016.   Urinary retention 05/2015   occurred s/p foley removal    Past Surgical History:  Procedure Laterality Date   aortic ultrasound  07/2012   2014 NO AAA.   01/2021-->4 cm AAA with R common iliac ectasia-->vasc surg ref   COLONOSCOPY  approx 2011 per pt   Done by surgeon in [place] after a bout of diverticulitis; normal per pt report--was told to repeat in 10 yrs.   CORONARY ARTERY BYPASS GRAFT N/A 05/12/2016   Procedure: CORONARY ARTERY BYPASS GRAFTING (CABG) TIMES THREE USING LEFT INTERNAL MAMMARY ARTERY AND RIGHT SAPHENOUS LEG VEIN HARVESTED ENDOSCOPICALLY;  Surgeon: [name], MD;  Location: [facility];  Service: Open Heart Surgery;  Laterality: N/A;   ICD IMPLANT N/A 04/12/2019   Procedure: ICD IMPLANT;  Surgeon: Evans Lance, MD;  Location: [facility];  Service: Cardiovascular;  Laterality: N/A;   INGUINAL HERNIA REPAIR  2003 and 2004   Both sides.   LAPAROSCOPY  2017   LEFT HEART CATH AND CORONARY ANGIOGRAPHY N/A 05/09/2016   Procedure: Left Heart Cath and Coronary Angiography;  Surgeon: [name], MD;  Location: [place] [facility];  Service: Cardiovascular;  Laterality: N/A;   LITHOTRIPSY  2004   Dr. [place] Pink in [place].   LYMPHADENECTOMY Bilateral 04/30/2015   Procedure: PELVIC LYMPHADENECTOMY;  Surgeon: [name], MD;  Location: [facility];  Service: Urology;  Laterality: Bilateral;   PROSTATE BIOPSY  02/05/2015   Prostate adenocarcinoma: pt considering treatment options as of 02/19/15   ROBOT ASSISTED LAPAROSCOPIC RADICAL PROSTATECTOMY N/A 04/30/2015   Procedure: XI ROBOTIC ASSISTED LAPAROSCOPIC RADICAL PROSTATECTOMY LEVEL 2;  Surgeon: [name], MD;  Location: [facility];  Service: Urology;  Laterality: N/A;   TEE WITHOUT CARDIOVERSION N/A 05/12/2016   Procedure: TRANSESOPHAGEAL ECHOCARDIOGRAM (TEE);  Surgeon: [name], MD;  Location: [facility];  Service: Open Heart Surgery;  Laterality: N/A;   TRANSTHORACIC ECHOCARDIOGRAM  03/22/2006; 05/2016; 07/2016; 07/2017; 01/02/20   2008: EF 65%, normal valves, no wall motion abnormalties, LA size normal.  05/2016 in context of non-STEMI, EF 20-25%, mild AV and MV regurg.   08/05/16: EF 30-35%, akinesis of mid lateral myoc, grd II DD, mild aortic 07/2017: EF 25-30%, mult areas of akinesis, grd I DD. 01/2020 EF 25-30%, sev LV dsfxn, valves fine, aortic root 28m    Outpatient Medications Prior to Visit  Medication Sig Dispense Refill   ALPRAZolam (XANAX) 0.25 MG tablet tAKE 1-2 tabLETS BY MOUTH AT BEDTIME AS NEEDED FOR insomnia/restless legs 60 tablet 5   aspirin EC 81 MG tablet Take 1 tablet (81 mg total) by mouth daily. Swallow whole. 90 tablet 3   benazepril (LOTENSIN) 20 MG tablet Take 1 tablet (20 mg total) by mouth daily. 1 tab po bid 180 tablet 3   dapagliflozin propanediol (FARXIGA) 10 MG TABS tablet Take 1 tablet (10 mg total) by mouth daily before breakfast. 30 tablet 6   furosemide (LASIX) 20 MG tablet 2 tabs po bid (Patient taking differently: Take 20 mg by mouth 2 (two) times daily. 2 tabs po bid) 60 tablet 0   Omega-3  Fatty Acids (FISH OIL) 1000 MG CAPS Take 1,000 mg by mouth daily.      Polyethyl Glycol-Propyl Glycol (LUBRICANT EYE DROPS) 0.4-0.3 % SOLN Place 1 drop into both eyes 3 (three) times daily as needed (dry/irritated eyes.).     Vitamin D, Cholecalciferol, 400 units CAPS Take 400 Units by mouth daily.      ondansetron (ZOFRAN) 4 MG tablet Take 1 tablet (4 mg total) by mouth every 8 (eight) hours as needed for nausea or vomiting. (Patient not taking: Reported on 01/03/2022) 20 tablet 1   No facility-administered medications prior to visit.    Allergies  Allergen Reactions   Entresto [Sacubitril-Valsartan] Hives and Shortness Of Breath   Amlodipine Swelling    LE swelling   Contrast Media [Iodinated Contrast Media] Other (See Comments)    Prostate problem had to wear a catheter for two weeks after having dye.    Hydralazine Palpitations and Other (See Comments)    Fatigue+    ROS As per HPI  PE:    01/03/2022   11:18 AM 01/03/2022   10:59 AM 12/22/2021    1:52 PM  Vitals with BMI  Height  '5\' 11"'$  '5\' 11"'$   Weight  145 lbs 151 lbs 3  oz  BMI  29.92 42.6  Systolic 834 196 222  Diastolic 75 87 68  Pulse  78 87   Physical Exam  Gen: Alert, well appearing.  Patient is oriented to person, place, time, and situation. AFFECT: pleasant, lucid thought and speech. CV: RRR, no m/r/g.   LUNGS: CTA bilat, nonlabored resps, good aeration in all lung fields. EXT: no clubbing or cyanosis.  no edema.    LABS:  Last metabolic panel Lab Results  Component Value Date   GLUCOSE 88 12/17/2021   NA 139 12/17/2021   K 4.5 12/17/2021   CL 102 12/17/2021   CO2 25 12/17/2021   BUN 24 12/17/2021   CREATININE 1.10 12/17/2021   GFRNONAA >60 12/12/2021   CALCIUM 9.7 12/17/2021   PROT 6.8 12/12/2021   ALBUMIN 4.1 12/12/2021   LABGLOB 2.2 09/16/2016   AGRATIO 2.0 09/16/2016   BILITOT 1.2 12/12/2021   ALKPHOS 63 12/12/2021   AST 29 12/12/2021   ALT 24 12/12/2021   ANIONGAP 7 12/12/2021   IMPRESSION AND PLAN:  Acute on chronic combined congestive heart failure. Resolved acute phase. We will continue Lasix 40 mg a day and Farxiga 10 mg a day. Blood pressure control at home is back to his baseline.  Continue Lotensin 20 mg twice a day.  An After Visit Summary was printed and given to the patient.  FOLLOW UP: Return in about 3 months (around 04/04/2022) for routine chronic illness f/u.  Signed:  Crissie Sickles, MD           01/03/2022

## 2022-01-04 ENCOUNTER — Telehealth: Payer: Self-pay

## 2022-01-04 DIAGNOSIS — E875 Hyperkalemia: Secondary | ICD-10-CM

## 2022-01-04 NOTE — Telephone Encounter (Signed)
-----   Message from [name], MD sent at 01/04/2022  8:26 AM EST ----- Labs normal except mild elevation of potassium. Make sure he is not taking any OTC potassium supplement. Avoid foods with lots of potassium. No change in medications at this time. Rpt BMET 1 week to make sure level not trending upward, dx hyperkalemia.

## 2022-01-07 ENCOUNTER — Ambulatory Visit (INDEPENDENT_AMBULATORY_CARE_PROVIDER_SITE_OTHER): Payer: Medicare Other

## 2022-01-07 DIAGNOSIS — I255 Ischemic cardiomyopathy: Secondary | ICD-10-CM | POA: Diagnosis not present

## 2022-01-07 LAB — CUP PACEART REMOTE DEVICE CHECK
Battery Remaining Longevity: 64 mo
Battery Remaining Percentage: 62 %
Battery Voltage: 2.96 V
Brady Statistic AP VP Percent: 57 %
Brady Statistic AP VS Percent: 37 %
Brady Statistic AS VP Percent: 1 %
Brady Statistic AS VS Percent: 4.6 %
Brady Statistic RA Percent Paced: 91 %
Brady Statistic RV Percent Paced: 58 %
Date Time Interrogation Session: 20231208010628
HighPow Impedance: 66 Ohm
Implantable Lead Connection Status: 753985
Implantable Lead Connection Status: 753985
Implantable Lead Implant Date: 20210312
Implantable Lead Implant Date: 20210312
Implantable Lead Location: 753859
Implantable Lead Location: 753860
Implantable Pulse Generator Implant Date: 20210312
Lead Channel Impedance Value: 410 Ohm
Lead Channel Impedance Value: 490 Ohm
Lead Channel Pacing Threshold Amplitude: 0.375 V
Lead Channel Pacing Threshold Amplitude: 1 V
Lead Channel Pacing Threshold Pulse Width: 0.5 ms
Lead Channel Pacing Threshold Pulse Width: 0.5 ms
Lead Channel Sensing Intrinsic Amplitude: 11.8 mV
Lead Channel Sensing Intrinsic Amplitude: 3.2 mV
Lead Channel Setting Pacing Amplitude: 1.25 V
Lead Channel Setting Pacing Amplitude: 1.375
Lead Channel Setting Pacing Pulse Width: 0.5 ms
Lead Channel Setting Sensing Sensitivity: 0.5 mV
Pulse Gen Serial Number: 111016652
Zone Setting Status: 755011

## 2022-01-11 ENCOUNTER — Other Ambulatory Visit (INDEPENDENT_AMBULATORY_CARE_PROVIDER_SITE_OTHER): Payer: Medicare Other

## 2022-01-11 ENCOUNTER — Telehealth: Payer: Self-pay

## 2022-01-11 DIAGNOSIS — E875 Hyperkalemia: Secondary | ICD-10-CM | POA: Diagnosis not present

## 2022-01-11 LAB — BASIC METABOLIC PANEL
BUN: 21 mg/dL (ref 6–23)
CO2: 29 mEq/L (ref 19–32)
Calcium: 10 mg/dL (ref 8.4–10.5)
Chloride: 102 mEq/L (ref 96–112)
Creatinine, Ser: 1.17 mg/dL (ref 0.40–1.50)
GFR: 60.47 mL/min (ref >60.00)
Glucose, Bld: 117 mg/dL — ABNORMAL HIGH (ref 70–99)
Potassium: 4.8 mEq/L (ref 3.5–5.1)
Sodium: 138 mEq/L (ref 135–145)

## 2022-01-11 NOTE — Telephone Encounter (Addendum)
He was last seen 01/03/22.  At night, his blood pressure gets low. Most recent blood pressure reading, 142/76. He took 1/2 tab of Benazepril today. He has not taken any blood pressure medication prior to today in a week.   Please Advise.

## 2022-01-11 NOTE — Telephone Encounter (Signed)
LVM with wife for pt to return call regarding concerns.

## 2022-01-11 NOTE — Telephone Encounter (Signed)
Patient came in today for lab appt, he states he some questions regarding his blood pressure meds.  Please call patient at [phone]

## 2022-01-28 NOTE — Progress Notes (Signed)
Remote ICD transmission.   

## 2022-02-17 NOTE — Patient Instructions (Signed)
Health Maintenance, Male Adopting a healthy lifestyle and getting preventive care are important in promoting health and wellness. Ask your health care provider about: The right schedule for you to have regular tests and exams. Things you can do on your own to prevent diseases and keep yourself healthy. What should I know about diet, weight, and exercise? Eat a healthy diet  Eat a diet that includes plenty of vegetables, fruits, low-fat dairy products, and lean protein. Do not eat a lot of foods that are high in solid fats, added sugars, or sodium. Maintain a healthy weight Body mass index (BMI) is a measurement that can be used to identify possible weight problems. It estimates body fat based on height and weight. Your health care provider can help determine your BMI and help you achieve or maintain a healthy weight. Get regular exercise Get regular exercise. This is one of the most important things you can do for your health. Most adults should: Exercise for at least 150 minutes each week. The exercise should increase your heart rate and make you sweat (moderate-intensity exercise). Do strengthening exercises at least twice a week. This is in addition to the moderate-intensity exercise. Spend less time sitting. Even light physical activity can be beneficial. Watch cholesterol and blood lipids Have your blood tested for lipids and cholesterol at 77 years of age, then have this test every 5 years. You may need to have your cholesterol levels checked more often if: Your lipid or cholesterol levels are high. You are older than 77 years of age. You are at high risk for heart disease. What should I know about cancer screening? Many types of cancers can be detected early and may often be prevented. Depending on your health history and family history, you may need to have cancer screening at various ages. This may include screening for: Colorectal cancer. Prostate cancer. Skin cancer. Lung  cancer. What should I know about heart disease, diabetes, and high blood pressure? Blood pressure and heart disease High blood pressure causes heart disease and increases the risk of stroke. This is more likely to develop in people who have high blood pressure readings or are overweight. Talk with your health care provider about your target blood pressure readings. Have your blood pressure checked: Every 3-5 years if you are 18-39 years of age. Every year if you are 40 years old or older. If you are between the ages of 65 and 75 and are a current or former smoker, ask your health care provider if you should have a one-time screening for abdominal aortic aneurysm (AAA). Diabetes Have regular diabetes screenings. This checks your fasting blood sugar level. Have the screening done: Once every three years after age 45 if you are at a normal weight and have a low risk for diabetes. More often and at a younger age if you are overweight or have a high risk for diabetes. What should I know about preventing infection? Hepatitis B If you have a higher risk for hepatitis B, you should be screened for this virus. Talk with your health care provider to find out if you are at risk for hepatitis B infection. Hepatitis C Blood testing is recommended for: Everyone born from 1945 through 1965. Anyone with known risk factors for hepatitis C. Sexually transmitted infections (STIs) You should be screened each year for STIs, including gonorrhea and chlamydia, if: You are sexually active and are younger than 77 years of age. You are older than 77 years of age and your   health care provider tells you that you are at risk for this type of infection. Your sexual activity has changed since you were last screened, and you are at increased risk for chlamydia or gonorrhea. Ask your health care provider if you are at risk. Ask your health care provider about whether you are at high risk for HIV. Your health care provider  may recommend a prescription medicine to help prevent HIV infection. If you choose to take medicine to prevent HIV, you should first get tested for HIV. You should then be tested every 3 months for as long as you are taking the medicine. Follow these instructions at home: Alcohol use Do not drink alcohol if your health care provider tells you not to drink. If you drink alcohol: Limit how much you have to 0-2 drinks a day. Know how much alcohol is in your drink. In the [place] one drink equals one 12 oz bottle of beer (355 mL), one 5 oz glass of wine (148 mL), or one 1 oz glass of hard liquor (44 mL). Lifestyle Do not use any products that contain nicotine or tobacco. These products include cigarettes, chewing tobacco, and vaping devices, such as e-cigarettes. If you need help quitting, ask your health care provider. Do not use street drugs. Do not share needles. Ask your health care provider for help if you need support or information about quitting drugs. General instructions Schedule regular health, dental, and eye exams. Stay current with your vaccines. Tell your health care provider if: You often feel depressed. You have ever been abused or do not feel safe at home. Summary Adopting a healthy lifestyle and getting preventive care are important in promoting health and wellness. Follow your health care provider's instructions about healthy diet, exercising, and getting tested or screened for diseases. Follow your health care provider's instructions on monitoring your cholesterol and blood pressure. This information is not intended to replace advice given to you by your health care provider. Make sure you discuss any questions you have with your health care provider. Document Revised: 06/08/2020 Document Reviewed: 06/08/2020 Elsevier Patient Education  2023 [employer]  

## 2022-02-18 ENCOUNTER — Encounter: Payer: Self-pay | Admitting: Family Medicine

## 2022-02-18 ENCOUNTER — Ambulatory Visit (INDEPENDENT_AMBULATORY_CARE_PROVIDER_SITE_OTHER): Payer: Medicare Other | Admitting: Family Medicine

## 2022-02-18 VITALS — BP 154/82 | HR 91 | Temp 98.0°F | Ht 70.0 in | Wt 149.2 lb

## 2022-02-18 DIAGNOSIS — I2510 Atherosclerotic heart disease of native coronary artery without angina pectoris: Secondary | ICD-10-CM

## 2022-02-18 DIAGNOSIS — Z0000 Encounter for general adult medical examination without abnormal findings: Secondary | ICD-10-CM | POA: Diagnosis not present

## 2022-02-18 DIAGNOSIS — I10 Essential (primary) hypertension: Secondary | ICD-10-CM

## 2022-02-18 DIAGNOSIS — I714 Abdominal aortic aneurysm, without rupture, unspecified: Secondary | ICD-10-CM

## 2022-02-18 DIAGNOSIS — I5022 Chronic systolic (congestive) heart failure: Secondary | ICD-10-CM | POA: Diagnosis not present

## 2022-02-18 LAB — BASIC METABOLIC PANEL
BUN: 23 mg/dL (ref 6–23)
CO2: 27 mEq/L (ref 19–32)
Calcium: 9.8 mg/dL (ref 8.4–10.5)
Chloride: 103 mEq/L (ref 96–112)
Creatinine, Ser: 0.98 mg/dL (ref 0.40–1.50)
GFR: 74.75 mL/min (ref >60.00)
Glucose, Bld: 83 mg/dL (ref 70–99)
Potassium: 4.7 mEq/L (ref 3.5–5.1)
Sodium: 139 mEq/L (ref 135–145)

## 2022-02-18 LAB — LIPID PANEL
Cholesterol: 168 mg/dL (ref 0–200)
HDL: 47.7 mg/dL (ref >39.00)
LDL Cholesterol: 110 mg/dL — ABNORMAL HIGH (ref 0–99)
NonHDL: 120.11
Total CHOL/HDL Ratio: 4
Triglycerides: 52 mg/dL (ref 0.0–149.0)
VLDL: 10.4 mg/dL (ref 0.0–40.0)

## 2022-02-18 MED ORDER — FUROSEMIDE 20 MG PO TABS: ORAL_TABLET | ORAL | 3 refills | Status: DC

## 2022-02-18 NOTE — Progress Notes (Signed)
Office Note 02/18/2022  CC:  Chief Complaint  Patient presents with   Annual Exam    Pt is fasting    HPI:  Patient is a 77 y.o. male who is here for annual health maintenance exam and follow-up hypertension and CHF.  A/P as of last visit: "Acute on chronic combined congestive heart failure. Resolved acute phase. We will continue Lasix 40 mg a day and Farxiga 10 mg a day. Blood pressure control at home is back to his baseline.  Continue Lotensin 20 mg twice a day"  INTERIM HX: Ngoc feels well other than some right groin pain and diarrhea the last couple of days. No fever, nausea, malaise, or urinary difficulties.  Taking lasix '20mg'$  bid. Denies shortness of breath at rest or dyspnea on exertion.  No chest pain. Home blood pressure ranging 120s to 160s over 60s to 80s. This is his usual.  He feels lightheaded and weak when it is less than 135/75.  Past Medical History:  Diagnosis Date   Abdominal aortic aneurysm ([place]) 01/2021   4 cm, infrarenal.  Also enlarged left common iliac artery--vascular eval 03/2021->recommended u/s rpt 1 yr.   Anxiety    Atrial fibrillation ([place])    Limited episode, emergency room, June, 2013, spontaneous conversion to sinus rhythm, was evaluated By [name] 2013- no further visits.  Post-op (CABG) a-fib, converted on amio but pt self d/c'd this med due to DOE/side effect profile.   CAD (coronary artery disease) 05/2016   a. 05/2016: NSTEMI with cath showing 3-vessel disease. Underwent CABG on 05/12/16 with LIMA-D1, SVG-LAD, and SVG-PDA.    COPD (chronic obstructive pulmonary disease) ([place]) fall 2016   Bullous changes noted on lower lung images of CT abd done by urology   Diverticulitis    Double vision 01/2020   MG ab w/u NEG.  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 Unable to have MRI due to having shrapnel in forehead.   Hyperlipidemia    statin intolerant. Pt declined advanced lipid clinic referral 08/2019, 03/2020, and 01/2021.   Hypertension    Ischemic cardiomyopathy 05/2016   EF 20-25% at the time of NSTEMI/CABG.  Repeat EF 07/2016 30-35%.  07/2017 EF 25-30%, pt intol of entresto, bidil, and BB--for ICD 03/2019.   Microscopic hematuria Fall 2016   CT nl except nonobstructing stones.  Cystoscopy normal 12/30/14 (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  Urol ([name]) at [facility]; [name] referred him to [name] 03/2015: patient got bilat nerve sparing , robot assisted laparoscopic radical prostatectomy and pelvic lymphadenectomy.  Urol f/u, PSA surveillance showing very mild uptrend of PSA but not to the level of biochemical recurrence as of 05/2021 urology follow-up   RLS (restless legs syndrome)    Tobacco dependence    down to 1-2 cigs per day as of 11/2015.  Restarted 08/2016.   Urinary retention 05/2015   occurred s/p foley removal  Past Surgical History:  Procedure Laterality Date   aortic ultrasound  07/2012   2014 NO AAA.  01/2021-->4 cm AAA with R common iliac ectasia-->vasc surg ref   COLONOSCOPY  approx 2011 per pt   Done by surgeon in [place] after a bout of diverticulitis; normal per pt report--was told to repeat in 10 yrs.   CORONARY ARTERY BYPASS GRAFT N/A 05/12/2016   Procedure: CORONARY ARTERY BYPASS GRAFTING (CABG) TIMES THREE USING LEFT INTERNAL MAMMARY ARTERY AND RIGHT SAPHENOUS LEG VEIN HARVESTED ENDOSCOPICALLY;  Surgeon: [name], MD;  Location: [place];   Service: Open Heart Surgery;  Laterality: N/A;   ICD IMPLANT N/A 04/12/2019   Procedure: ICD IMPLANT;  Surgeon: Evans Lance, MD;  Location: [place] [facility];  Service: Cardiovascular;  Laterality: N/A;   INGUINAL HERNIA REPAIR  2003 and 2004   Both sides.   LAPAROSCOPY  2017   LEFT HEART CATH AND CORONARY ANGIOGRAPHY N/A 05/09/2016   Procedure: Left Heart Cath and Coronary Angiography;  Surgeon: [name], MD;  Location: [place] [facility];  Service: Cardiovascular;  Laterality: N/A;   LITHOTRIPSY  2004   Dr. [place] Pink in [place].   LYMPHADENECTOMY Bilateral 04/30/2015   Procedure: PELVIC LYMPHADENECTOMY;  Surgeon: [name], MD;  Location: [facility];  Service: Urology;  Laterality: Bilateral;   PROSTATE BIOPSY  02/05/2015   Prostate adenocarcinoma: pt considering treatment options as of 02/19/15   ROBOT ASSISTED LAPAROSCOPIC RADICAL PROSTATECTOMY N/A 04/30/2015   Procedure: XI ROBOTIC ASSISTED LAPAROSCOPIC RADICAL PROSTATECTOMY LEVEL 2;  Surgeon: [name], MD;  Location: [facility];  Service: Urology;  Laterality: N/A;   TEE WITHOUT CARDIOVERSION N/A 05/12/2016   Procedure: TRANSESOPHAGEAL ECHOCARDIOGRAM (TEE);  Surgeon: [name], MD;  Location: [place];  Service: Open Heart Surgery;  Laterality: N/A;   TRANSTHORACIC ECHOCARDIOGRAM  03/22/2006; 05/2016; 07/2016; 07/2017; 01/02/20   2008: EF 65%, normal valves, no wall motion abnormalties, LA size normal.  05/2016 in context of non-STEMI, EF 20-25%, mild AV and MV regurg.  08/05/16: EF 30-35%, akinesis of mid lateral myoc, grd II DD, mild aortic 07/2017: EF 25-30%, mult areas of akinesis, grd I DD. 01/2020 EF 25-30%, sev LV dsfxn, valves fine, aortic root 58m    Family History  Problem Relation Age of Onset   Hypertension Mother    Cancer - Other Mother 71      breast   Cancer Father        Lung ca age 77  Diabetes Sister     Social History   Socioeconomic History   Marital status: Married    Spouse name: Not on file    Number of children: Not on file   Years of education: Not on file   Highest education level: Not on file  Occupational History   Not on file  Tobacco Use   Smoking status: Some Days    Packs/day: 1.00    Years: 55.00    Total pack years: 55.00    Types: Cigarettes   Smokeless tobacco: Never   Tobacco comments:    quit in 2017  Vaping Use   Vaping Use: Never used  Substance and Sexual Activity   Alcohol use: No   Drug use: No   Sexual activity: Not on file  Other Topics Concern   Not on file  Social History Narrative   Married, 2 grown children.   HS education.  [place].   Occupation: maintenance.  Drives a tractor a lot, drives a pickup truck a lot, rides horses a lot.   Tobacco: 20 pack-yr hx (current as of 07/2012)   No drugs.   Alcohol: none   Social Determinants of Health   Financial Resource Strain: Low Risk  (09/29/2021)   Overall Financial Resource Strain (CARDIA)    Difficulty of Paying Living Expenses: Not hard at all  Food Insecurity: No Food Insecurity (09/29/2021)   Hunger Vital Sign    Worried About Running Out of Food in the Last Year: Never true    Ran Out of Food in the Last Year: Never true  Transportation Needs: No Transportation Needs (09/29/2021)   PRAPARE - Hydrologist (Medical): No    Lack of Transportation (Non-Medical): No  Physical Activity: Inactive (09/29/2021)   Exercise Vital Sign    Days of Exercise per Week: 0 days    Minutes of Exercise per Session: 0 min  Stress: No Stress Concern Present (09/29/2021)   [place]    Feeling of Stress : Not at all  Social Connections: Moderately Integrated (09/29/2021)   Social Connection and Isolation Panel [NHANES]    Frequency of Communication with Friends and Family: More than three times a week    Frequency of Social Gatherings with Friends and Family: More than three times  a week    Attends Religious Services: More than 4 times per year    Active Member of [employer] or Organizations: No    Attends Archivist Meetings: Never    Marital Status: Married  Human resources officer Violence: Not At Risk (09/29/2021)   Humiliation, Afraid, Rape, and Kick questionnaire    Fear of Current or Ex-Partner: No    Emotionally Abused: No    Physically Abused: No    Sexually Abused: No    Outpatient Medications Prior to Visit  Medication Sig Dispense Refill   ALPRAZolam (XANAX) 0.25 MG tablet tAKE 1-2 tabLETS BY MOUTH AT BEDTIME AS NEEDED FOR insomnia/restless legs 60 tablet 5   aspirin EC 81 MG tablet Take 1 tablet (81 mg total) by mouth daily. Swallow whole. 90 tablet 3   benazepril (LOTENSIN) 20 MG tablet Take 1 tablet (20 mg total) by mouth daily. 1 tab po bid 180 tablet 3   dapagliflozin propanediol (FARXIGA) 10 MG TABS tablet Take 1 tablet (10 mg total) by mouth daily before breakfast. 30 tablet 6   furosemide (LASIX) 20 MG tablet 2 tabs po bid (Patient taking differently: Take 20 mg by mouth 2 (two) times daily. 2 tabs po bid) 60 tablet 0   Omega-3 Fatty Acids (FISH OIL) 1000 MG CAPS Take 1,000 mg by mouth daily.      Polyethyl Glycol-Propyl Glycol (LUBRICANT EYE DROPS) 0.4-0.3 % SOLN Place 1 drop into both eyes 3 (three) times daily as needed (dry/irritated eyes.).     Vitamin D, Cholecalciferol, 400 units CAPS Take 400 Units by mouth daily.      ondansetron (ZOFRAN) 4 MG tablet Take 1 tablet (4 mg total) by mouth every 8 (eight) hours as needed for nausea or vomiting. (Patient not taking: Reported on 02/18/2022) 20 tablet 1   No facility-administered medications prior to visit.    Allergies  Allergen Reactions   Entresto [Sacubitril-Valsartan] Hives and Shortness Of Breath   Amlodipine Swelling    LE swelling   Contrast Media [Iodinated Contrast Media] Other (See Comments)    Prostate problem had to  wear a catheter for two weeks after having dye.    Hydralazine  Palpitations and Other (See Comments)    Fatigue+    Review of Systems  Constitutional:  Negative for appetite change, chills, fatigue and fever.  HENT:  Negative for congestion, dental problem, ear pain and sore throat.   Eyes:  Negative for discharge, redness and visual disturbance.  Respiratory:  Negative for cough, chest tightness, shortness of breath and wheezing.   Cardiovascular:  Negative for chest pain, palpitations and leg swelling.  Gastrointestinal:  Positive for diarrhea. Negative for abdominal pain, blood in stool, nausea and vomiting.  Genitourinary:  Negative for difficulty urinating, dysuria, flank pain, frequency, hematuria and urgency.  Musculoskeletal:  Negative for arthralgias, back pain, joint swelling, myalgias and neck stiffness.  Skin:  Negative for pallor and rash.  Neurological:  Negative for dizziness, speech difficulty, weakness and headaches.  Hematological:  Negative for adenopathy. Does not bruise/bleed easily.  Psychiatric/Behavioral:  Negative for confusion and sleep disturbance. The patient is not nervous/anxious.     PE;    02/18/2022    8:52 AM 01/03/2022   11:18 AM 01/03/2022   10:59 AM  Vitals with BMI  Height '5\' 10"'$   '5\' 11"'$   Weight 149 lbs 3 oz  145 lbs  BMI 28.76  81.15  Systolic 726 203 559  Diastolic 81 75 87  Pulse 91  78    Gen: Alert, well appearing.  Patient is oriented to person, place, time, and situation. AFFECT: pleasant, lucid thought and speech. ENT: Ears: EACs clear, normal epithelium.  TMs with good light reflex and landmarks bilaterally.  Eyes: no injection, icteris, swelling, or exudate.  EOMI, PERRLA. Nose: no drainage or turbinate edema/swelling.  No injection or focal lesion.  Mouth: lips without lesion/swelling.  Oral mucosa pink and moist.  Dentition intact and without obvious caries or gingival swelling.  Oropharynx without erythema, exudate, or swelling.  Neck: supple/nontender.  No LAD, mass, or TM.  Carotid pulses 2+  bilaterally, without bruits. CV: RRR with occasional ectopy, soft systolic murmur, no diastolic murmur, no r/g.   LUNGS: CTA bilat, nonlabored resps, good aeration in all lung fields. ABD: soft, NT, ND, BS normal.  No hepatospenomegaly. He has a palpable strong aortic pulsation and mid abdomen with a bruit present. EXT: no clubbing, cyanosis, or edema.  Musculoskeletal: no joint swelling, erythema, warmth, or tenderness.  ROM of all joints intact. Skin - no sores or suspicious lesions or rashes or color changes  Pertinent labs:  Lab Results  Component Value Date   TSH 0.48 09/29/2020   Lab Results  Component Value Date   WBC 6.7 12/12/2021   HGB 16.0 12/12/2021   HCT 48.5 12/12/2021   MCV 97.4 12/12/2021   PLT 131 (L) 12/12/2021   Lab Results  Component Value Date   CREATININE 1.17 01/11/2022   BUN 21 01/11/2022   NA 138 01/11/2022   K 4.8 01/11/2022   CL 102 01/11/2022   CO2 29 01/11/2022   Lab Results  Component Value Date   ALT 24 12/12/2021   AST 29 12/12/2021   ALKPHOS 63 12/12/2021   BILITOT 1.2 12/12/2021   Lab Results  Component Value Date   CHOL 177 02/04/2021   Lab Results  Component Value Date   HDL 44.50 02/04/2021   Lab Results  Component Value Date   LDLCALC 121 (H) 02/04/2021   Lab Results  Component Value Date   TRIG 58.0 02/04/2021   Lab Results  Component Value Date   CHOLHDL 4 02/04/2021   Lab Results  Component Value Date   PSA 0.11 11/17/2021   PSA 0.15 06/22/2021   PSA 0.10 02/04/2021   Lab Results  Component Value Date   HGBA1C 5.2 01/02/2020   ASSESSMENT AND PLAN:   #1 health maintenance exam: Reviewed age and gender appropriate health maintenance issues (prudent diet, regular exercise, health risks of tobacco and excessive alcohol, use of seatbelts, fire alarms in home, use of sunscreen).  Also reviewed age and gender appropriate health screening as well as vaccine recommendations. Vaccines: Shingrix->declined.   Tdap-->declined.  Flu->declined. Labs: lipid panel, bmet. Prostate ca screening: has hx of prostate ca, followed by urol, most recent PSA 10/2021 was 0.11. Colon ca screening: last colonoscopy 2011, no polyps per pt report-->due for repeat-->he defers at this time.  #2 hypertension, not ideal control but stable.  He feels weak and dizzy if his blood pressure goes below 135/75. Will continue with benazepril 20 mg twice daily. Electrolytes and creatinine today  #3 ischemic cardiomyopathy. He is now asymptomatic on Farxiga 10 mg a day and Lasix 20 mg twice a day. Electrolytes and creatinine today.  #4 abdominal aortic aneurysm. Patient states he is already set up for a follow-up abdominal ultrasound and vascular MD follow-up next month.  An After Visit Summary was printed and given to the patient.  FOLLOW UP:  Return in about 3 months (around 05/20/2022) for routine chronic illness f/u.  Signed:  Crissie Sickles, MD           02/18/2022

## 2022-02-21 NOTE — Progress Notes (Signed)
noted 

## 2022-03-23 ENCOUNTER — Telehealth: Payer: Self-pay

## 2022-03-23 NOTE — Telephone Encounter (Signed)
**Note De-Identified Rochell Mabie Obfuscation** I called [facility] in [place] at [phone] and was advised that the pts plan prefers name brand Daniel [name] and that the RX has been filled and picked up by the pt. No PA required for generic Dapagliflozin.

## 2022-03-27 NOTE — Progress Notes (Unsigned)
Cardiology Office Note   Date:  03/30/2022   ID:  Daniel [name]., DOB 06-Mar-1945, MRN [id number]  PCP:  [name], MD  Cardiologist:   [name], MD   Chief Complaint  Patient presents with   Cardiomyopathy       History of Present Illness: Daniel [name]. is a 77 y.o. male who presents for follow up of CAD s/p CABG LIMA to D1, SVG to LAD, SVG to PDA on 05/12/2016, HLD intolerant to statin, HTN, ICM with baseline EF 30-35%, and h/o prostate CA. His last cardiac catheterization on 05/09/2016 showed EF 20-25%, akinesis of the distal inferoapical segment with significant hypokinesis globally consistent with ischemic cardiomyopathy, multivessel disease with 70% followed by total occlusion of large LAD with distal collateral arteries, 50% proximal left circumflex stenosis with total occlusion of distal left circumflex, total occlusion of mid RCA with distal collateral artery. Bypass surgery was recommended and he eventually underwent successful CABG 3 by Dr. Roxan Hockey on 05/12/2016 with LIMA to diagonal, SVG to LAD, SVG to PDA. He developed postoperative atrial fibrillation and was treated with IV amiodarone with conversion to sinus rhythm prior to discharge. He had a follow-up echocardiogram on 08/05/2016 which revealed LV EF remains low at 30-35% however slightly improved from his previous 20-25%.  He was placed on carvedilol and Entresto, spironolactone was discontinued due to elevated potassium level. Unfortunately, he went back to the hospital on 11/20/2016 with shortness breath, diffuse rash and presyncope. He was given prednisone for his rash. He stopped the [facility] and restarted ACE inhibitor as he was sure the rash was related to the former.  Last EF in July was 25 - 30%.   He could not take the BiDil.  His blood pressure dropped too low.  He had a low heart rate and we stopped Coreg.  I did manage to get him to take Zebeta but it turns out he will only take it if his blood  pressure is greater than 140.  He also stop spironolactone as he thought it was making his heart skip . He now status post ICD.  He returns for follow up.  Since the last visit he was started on Farxiga.  He says that as long as his blood pressure stays above 140 he feels fine.  He says he does well with the [place].  This helped with fluid issues.  He is not having any chest pressure, neck or arm discomfort.  He still does a lot of work at home.  He says he tires out more than he used to but he is not describing PND or orthopnea.  Is not having any chest pressure, neck or arm discomfort.  Past Medical History:  Diagnosis Date   Abdominal aortic aneurysm ([place]) 01/2021   4 cm, infrarenal.  Also enlarged left common iliac artery--vascular eval 03/2021->recommended u/s rpt 1 yr.   Anxiety    Atrial fibrillation ([place])    Limited episode, emergency room, June, 2013, spontaneous conversion to sinus rhythm, was evaluated By [name] 2013- no further visits.  Post-op (CABG) a-fib, converted on amio but pt self d/c'd this med due to DOE/side effect profile.   CAD (coronary artery disease) 05/2016   a. 05/2016: NSTEMI with cath showing 3-vessel disease. Underwent CABG on 05/12/16 with LIMA-D1, SVG-LAD, and SVG-PDA.    COPD (chronic obstructive pulmonary disease) ([place]) fall 2016   Bullous changes noted on lower lung images of CT abd done  by urology   Diverticulitis    Double vision 01/2020   MG ab w/u NEG.  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 Unable to have MRI due to having shrapnel in forehead.   Hyperlipidemia    statin intolerant. Pt declined advanced lipid clinic referral 08/2019,  03/2020, and 01/2021.   Hypertension    Ischemic cardiomyopathy 05/2016   EF 20-25% at the time of NSTEMI/CABG.  Repeat EF 07/2016 30-35%.  07/2017 EF 25-30%, pt intol of entresto, bidil, and BB--for ICD 03/2019.   Microscopic hematuria Fall 2016   CT nl except nonobstructing stones.  Cystoscopy normal 12/30/14 (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  Urol ([name]) at [facility]; [name] referred him to [name] 03/2015: patient got bilat nerve sparing , robot assisted laparoscopic radical prostatectomy and pelvic lymphadenectomy.  Urol f/u, PSA surveillance showing very mild uptrend of PSA but not to the level of biochemical recurrence as of 05/2021 urology follow-up   RLS (restless legs syndrome)    Tobacco dependence    down to 1-2 cigs per day as of 11/2015.  Restarted 08/2016.   Urinary retention 05/2015   occurred s/p foley removal    Past Surgical History:  Procedure Laterality Date   aortic ultrasound  07/2012   2014 NO AAA.  01/2021-->4 cm AAA with R common iliac ectasia-->vasc surg ref   COLONOSCOPY  approx 2011 per pt   Done by surgeon in [place] after a bout of diverticulitis; normal per pt report--was told to repeat in 10 yrs.   CORONARY ARTERY BYPASS GRAFT N/A 05/12/2016   Procedure: CORONARY ARTERY BYPASS GRAFTING (CABG) TIMES THREE USING LEFT INTERNAL MAMMARY ARTERY AND RIGHT SAPHENOUS LEG VEIN HARVESTED ENDOSCOPICALLY;  Surgeon: [name], MD;  Location: [facility];  Service: Open Heart Surgery;  Laterality: N/A;   ICD IMPLANT N/A 04/12/2019   Procedure: ICD IMPLANT;  Surgeon: Evans Lance, MD;  Location: [facility];  Service: Cardiovascular;  Laterality: N/A;   INGUINAL HERNIA REPAIR  2003 and 2004   Both sides.   LAPAROSCOPY  2017    LEFT HEART CATH AND CORONARY ANGIOGRAPHY N/A 05/09/2016   Procedure: Left Heart Cath and Coronary Angiography;  Surgeon: [name], MD;  Location: [facility];  Service: Cardiovascular;  Laterality: N/A;   LITHOTRIPSY  2004   Dr. [place] Pink in [place].   LYMPHADENECTOMY Bilateral 04/30/2015   Procedure: PELVIC LYMPHADENECTOMY;  Surgeon: [name], MD;  Location: [facility];  Service: Urology;  Laterality: Bilateral;   PROSTATE BIOPSY  02/05/2015   Prostate adenocarcinoma: pt considering treatment options as of 02/19/15   ROBOT ASSISTED LAPAROSCOPIC RADICAL PROSTATECTOMY N/A 04/30/2015   Procedure: XI ROBOTIC ASSISTED LAPAROSCOPIC RADICAL PROSTATECTOMY LEVEL 2;  Surgeon: [name], MD;  Location: [facility];  Service: Urology;  Laterality: N/A;   TEE WITHOUT CARDIOVERSION N/A 05/12/2016   Procedure: TRANSESOPHAGEAL ECHOCARDIOGRAM (TEE);  Surgeon:  [name], MD;  Location: [facility];  Service: Open Heart Surgery;  Laterality: N/A;   TRANSTHORACIC ECHOCARDIOGRAM  03/22/2006; 05/2016; 07/2016; 07/2017; 01/02/20   2008: EF 65%, normal valves, no wall motion abnormalties, LA size normal.  05/2016 in context of non-STEMI, EF 20-25%, mild AV and MV regurg.  08/05/16: EF 30-35%, akinesis of mid lateral myoc, grd II DD, mild aortic 07/2017: EF 25-30%, mult areas of akinesis, grd I DD. 01/2020 EF 25-30%, sev LV dsfxn, valves fine, aortic root 22m     Current Outpatient Medications  Medication Sig Dispense Refill   ALPRAZolam (XANAX) 0.25 MG tablet tAKE 1-2 tabLETS BY MOUTH AT BEDTIME AS NEEDED FOR insomnia/restless legs 60 tablet 5   aspirin EC 81 MG tablet Take 1 tablet (81 mg total) by mouth daily. Swallow whole. 90 tablet 3   benazepril (LOTENSIN) 20 MG tablet Take 1 tablet (20 mg total) by mouth daily. 1 tab po bid 180 tablet 3   dapagliflozin propanediol (FARXIGA) 10 MG TABS tablet Take 1 tablet (10 mg total) by mouth daily before breakfast. 30 tablet 6   furosemide (LASIX) 20 MG tablet 1  tab po bid (Patient taking differently: Take 20 mg by mouth 2 (two) times daily. 1 tab po bid) 180 tablet 3   Omega-3 Fatty Acids (FISH OIL) 1000 MG CAPS Take 1,000 mg by mouth daily.      ondansetron (ZOFRAN) 4 MG tablet Take 1 tablet (4 mg total) by mouth every 8 (eight) hours as needed for nausea or vomiting. 20 tablet 1   Polyethyl Glycol-Propyl Glycol (LUBRICANT EYE DROPS) 0.4-0.3 % SOLN Place 1 drop into both eyes 3 (three) times daily as needed (dry/irritated eyes.).     predniSONE (DELTASONE) 50 MG tablet Take 1 tab 13 hrs before, 7 hrs before and 1 hr before CT 3 tablet 0   Vitamin D, Cholecalciferol, 400 units CAPS Take 400 Units by mouth daily.      No current facility-administered medications for this visit.    Allergies:   Entresto [sacubitril-valsartan], Amlodipine, Contrast media [iodinated contrast media], and Hydralazine    ROS:  Please see the history of present illness.   Otherwise, review of systems are positive for none.   All other systems are reviewed and negative.    PHYSICAL EXAM: VS:  BP (!) 150/80   Pulse 64   Ht '5\' 11"'$  (1.803 m)   Wt 147 lb (66.7 kg)   BMI 20.50 kg/m  , BMI Body mass index is 20.5 kg/m.  GENERAL:  Well appearing NECK:  No jugular venous distention, waveform within normal limits, carotid upstroke brisk and symmetric, no bruits, no thyromegaly LUNGS:  Clear to auscultation bilaterally CHEST:  Well healed sternotomy scar.  Healed ICD pocket.   HEART:  PMI not displaced or sustained,S1 and S2 within normal limits, no S3, no S4, no clicks, no rubs, no murmurs ABD:  Flat, positive bowel sounds normal in frequency in pitch, no bruits, no rebound, no guarding, no midline pulsatile mass, no hepatomegaly, no splenomegaly EXT:  2 plus pulses throughout, no edema, no cyanosis no clubbing\   EKG:  EKG is not done today   Recent Labs: 12/12/2021: ALT 24; Hemoglobin 16.0; Platelets 131 12/17/2021: Brain Natriuretic Peptide 1,361 02/18/2022: BUN 23;  Creatinine, Ser 0.98; Potassium 4.7; Sodium 139    Lipid Panel    Component Value Date/Time   CHOL 168 02/18/2022 0921   CHOL 137 09/16/2016 0858   TRIG 52.0 02/18/2022 0921  HDL 47.70 02/18/2022 0921   HDL 35 (L) 09/16/2016 0858   CHOLHDL 4 02/18/2022 0921   VLDL 10.4 02/18/2022 0921   LDLCALC 110 (H) 02/18/2022 0921   LDLCALC 88 09/16/2016 0858      Wt Readings from Last 3 Encounters:  03/30/22 147 lb (66.7 kg)  02/18/22 149 lb 3.2 oz (67.7 kg)  01/03/22 145 lb (65.8 kg)      Other studies Reviewed: Additional studies/ records that were reviewed today include:   Primary care records Review of the above records demonstrates:  See elsewhere  ASSESSMENT AND PLAN:  CAD s/p CABG:    The patient has no new sypmtoms.  No further cardiovascular testing is indicated.  We will continue with aggressive risk reduction and meds as listed.  Ischemic cardiomyopathy:    He has not wanted further med titration.  No change in therapy.    Hypertension: Blood pressure is elevated but he has not wanted further med titration.    Hyperlipidemia:    LDL was at target with an LDL of 110 and HDL 47.7 but he has not again wanted med titration.   ICD: He is up-to-date with follow-up in December.  I reviewed these records.  AAA: Ultrasound last year demonstrated the distal aorta appear to be 4 x 3.9 cm.  This had increased in size.  He also had ectatic borderline aneurysmal left common iliac artery dilatation at 2.3 cm.  We will go ahead and order a CT angiogram to follow-up.  The following changes have been made: None  Labs/ tests ordered today include: None Orders Placed This Encounter  Procedures   CT ANGIO ABDOMEN W &/OR WO CONTRAST   Basic metabolic panel     Disposition:   FU with me in 12 months.    Signed, [name], MD  03/30/2022 2:06 PM    [place]

## 2022-03-30 ENCOUNTER — Encounter: Payer: Self-pay | Admitting: Cardiology

## 2022-03-30 ENCOUNTER — Other Ambulatory Visit: Payer: Self-pay | Admitting: *Deleted

## 2022-03-30 ENCOUNTER — Ambulatory Visit: Payer: Medicare Other | Admitting: Cardiology

## 2022-03-30 VITALS — BP 150/80 | HR 64 | Ht 71.0 in | Wt 147.0 lb

## 2022-03-30 DIAGNOSIS — R1907 Generalized intra-abdominal and pelvic swelling, mass and lump: Secondary | ICD-10-CM | POA: Diagnosis not present

## 2022-03-30 DIAGNOSIS — E785 Hyperlipidemia, unspecified: Secondary | ICD-10-CM | POA: Diagnosis not present

## 2022-03-30 DIAGNOSIS — I5043 Acute on chronic combined systolic (congestive) and diastolic (congestive) heart failure: Secondary | ICD-10-CM

## 2022-03-30 DIAGNOSIS — I10 Essential (primary) hypertension: Secondary | ICD-10-CM | POA: Diagnosis not present

## 2022-03-30 DIAGNOSIS — I2510 Atherosclerotic heart disease of native coronary artery without angina pectoris: Secondary | ICD-10-CM | POA: Diagnosis not present

## 2022-03-30 DIAGNOSIS — Z01812 Encounter for preprocedural laboratory examination: Secondary | ICD-10-CM

## 2022-03-30 MED ORDER — PREDNISONE 50 MG PO TABS: ORAL_TABLET | ORAL | 0 refills | Status: DC

## 2022-03-30 NOTE — Patient Instructions (Signed)
Medication Instructions:  The current medical regimen is effective;  continue present plan and medications.  *If you need a refill on your cardiac medications before your next appointment, please call your pharmacy*   Lab Work: Please have blood work a few days before your CT scan.  This can be completed at any [facility]. ([place])  If you have labs (blood work) drawn today and your tests are completely normal, you will receive your results only by: [place] (if you have MyChart) OR A paper copy in the mail If you have any lab test that is abnormal or we need to change your treatment, we will call you to review the results.   Testing/Procedures: Non-Cardiac CT Angiography (CTA), is a special type of CT scan that uses a computer to produce multi-dimensional views of major blood vessels throughout the body. In CT angiography, a contrast material is injected through an IV to help visualize the blood vessels This will be completed at [facility] and you will be contacted to be scheduled.  Please take Prednisone 50 mg - 1 tablet 13 hours before, 1 tablet 7 hours before and 1 tablet 1 hour before your CT scan.   Follow-Up: At [facility], you and your health needs are our priority.  As part of our continuing mission to provide you with exceptional heart care, we have created designated Provider Care Teams.  These Care Teams include your primary Cardiologist (physician) and Advanced Practice Providers (APPs -  Physician Assistants and Nurse Practitioners) who all work together to provide you with the care you need, when you need it.  We recommend signing up for the patient portal called "MyChart".  Sign up information is provided on this After Visit Summary.  MyChart is used to connect with patients for Virtual Visits (Telemedicine).  Patients are able to view lab/test results, encounter notes, upcoming appointments, etc.  Non-urgent messages can be sent to your provider as well.   To  learn more about what you can do with MyChart, go to [email or web address].    Your next appointment:   1 year(s)  Provider:   [name], MD

## 2022-04-04 ENCOUNTER — Ambulatory Visit: Payer: Medicare Other | Admitting: Family Medicine

## 2022-04-08 ENCOUNTER — Ambulatory Visit: Payer: Medicare Other

## 2022-04-08 DIAGNOSIS — I255 Ischemic cardiomyopathy: Secondary | ICD-10-CM

## 2022-04-08 LAB — CUP PACEART REMOTE DEVICE CHECK
Battery Remaining Longevity: 60 mo
Battery Remaining Percentage: 59 %
Battery Voltage: 2.96 V
Brady Statistic AP VP Percent: 60 %
Brady Statistic AP VS Percent: 33 %
Brady Statistic AS VP Percent: 1.3 %
Brady Statistic AS VS Percent: 4.3 %
Brady Statistic RA Percent Paced: 90 %
Brady Statistic RV Percent Paced: 61 %
Date Time Interrogation Session: 20240308020028
HighPow Impedance: 63 Ohm
Implantable Lead Connection Status: 753985
Implantable Lead Connection Status: 753985
Implantable Lead Implant Date: 20210312
Implantable Lead Implant Date: 20210312
Implantable Lead Location: 753859
Implantable Lead Location: 753860
Implantable Pulse Generator Implant Date: 20210312
Lead Channel Impedance Value: 410 Ohm
Lead Channel Impedance Value: 460 Ohm
Lead Channel Pacing Threshold Amplitude: 0.375 V
Lead Channel Pacing Threshold Amplitude: 0.875 V
Lead Channel Pacing Threshold Pulse Width: 0.5 ms
Lead Channel Pacing Threshold Pulse Width: 0.5 ms
Lead Channel Sensing Intrinsic Amplitude: 11.8 mV
Lead Channel Sensing Intrinsic Amplitude: 2 mV
Lead Channel Setting Pacing Amplitude: 1.125
Lead Channel Setting Pacing Amplitude: 1.375
Lead Channel Setting Pacing Pulse Width: 0.5 ms
Lead Channel Setting Sensing Sensitivity: 0.5 mV
Pulse Gen Serial Number: 111016652
Zone Setting Status: 755011

## 2022-05-09 NOTE — Progress Notes (Signed)
Remote ICD transmission.   

## 2022-05-17 ENCOUNTER — Encounter: Payer: Self-pay | Admitting: Family Medicine

## 2022-05-17 ENCOUNTER — Ambulatory Visit (INDEPENDENT_AMBULATORY_CARE_PROVIDER_SITE_OTHER): Payer: Medicare Other | Admitting: Family Medicine

## 2022-05-17 VITALS — BP 130/80 | HR 72 | Temp 97.7°F | Ht 71.0 in | Wt 143.6 lb

## 2022-05-17 DIAGNOSIS — I5042 Chronic combined systolic (congestive) and diastolic (congestive) heart failure: Secondary | ICD-10-CM

## 2022-05-17 DIAGNOSIS — I10 Essential (primary) hypertension: Secondary | ICD-10-CM | POA: Diagnosis not present

## 2022-05-17 LAB — BASIC METABOLIC PANEL
BUN: 25 mg/dL — ABNORMAL HIGH (ref 6–23)
CO2: 28 mEq/L (ref 19–32)
Calcium: 10 mg/dL (ref 8.4–10.5)
Chloride: 101 mEq/L (ref 96–112)
Creatinine, Ser: 0.93 mg/dL (ref 0.40–1.50)
GFR: 79.46 mL/min (ref >60.00)
Glucose, Bld: 88 mg/dL (ref 70–99)
Potassium: 4.4 mEq/L (ref 3.5–5.1)
Sodium: 137 mEq/L (ref 135–145)

## 2022-05-17 NOTE — Progress Notes (Signed)
OFFICE VISIT  05/17/2022  CC:  Chief Complaint  Patient presents with   Medical Management of Chronic Issues    Patient is a 77 y.o. male who presents for 25-month follow-up hypertension and chronic combined congestive heart failure. A/P as of last visit: "#1 health maintenance exam: Reviewed age and gender appropriate health maintenance issues (prudent diet, regular exercise, health risks of tobacco and excessive alcohol, use of seatbelts, fire alarms in home, use of sunscreen).  Also reviewed age and gender appropriate health screening as well as vaccine recommendations. Vaccines: Shingrix->declined.  Tdap-->declined.  Flu->declined. Labs: lipid panel, bmet. Prostate ca screening: has hx of prostate ca, followed by urol, most recent PSA 10/2021 was 0.11. Colon ca screening: last colonoscopy 2011, no polyps per pt report-->due for repeat-->he defers at this time.   #2 hypertension, not ideal control but stable.  He feels weak and dizzy if his blood pressure goes below 135/75. Will continue with benazepril 20 mg twice daily. Electrolytes and creatinine today   #3 ischemic cardiomyopathy. He is now asymptomatic on Farxiga 10 mg a day and Lasix 20 mg twice a day. Electrolytes and creatinine today.   #4 abdominal aortic aneurysm. Patient states he is already set up for a follow-up abdominal ultrasound and vascular MD follow-up next month."  INTERIM HX: Last visit all labs were normal except LDL cholesterol was elevated.  Patient declines any cholesterol-lowering medication.  States he is feeling well, just gets more tired in the legs when he is active.  No leg pains.  No leg swelling except mild daytime swelling of the right lower extremity which she has had ever since he had vein harvesting for his CABG back in 2018.  Home blood pressures usually 1 25-1 30 systolic over 70s diastolic when he checks in the evenings. Morning time it is typically up into the 160s systolic.  Only  occasionally takes alprazolam for insomnia and restless leg syndrome. PMP AWARE reviewed today: most recent rx for alprazolam 0.25 mg tab was filled 11/17/2021, # 60, rx by me. No red flags.  ROS as above, plus--> no fevers, no CP, no SOB, no wheezing, no cough, no dizziness, no HAs, no rashes, no melena/hematochezia.  No polyuria or polydipsia.  No myalgias or arthralgias.  No focal weakness, paresthesias, or tremors.  No acute vision or hearing abnormalities.  No dysuria or unusual/new urinary urgency or frequency.  No recent changes in lower legs. No n/v/d or abd pain.  No palpitations.     Past Medical History:  Diagnosis Date   Abdominal aortic aneurysm 01/2021   4 cm, infrarenal.  Also enlarged left common iliac artery--vascular eval 03/2021->recommended u/s rpt 1 yr.   Anxiety    Atrial fibrillation    Limited episode, emergency room, June, 2013, spontaneous conversion to sinus rhythm, was evaluated By Dr. Myrtis Ser 2013- no further visits.  Post-op (CABG) a-fib, converted on amio but pt self d/c'd this med due to DOE/side effect profile.   CAD (coronary artery disease) 05/2016   a. 05/2016: NSTEMI with cath showing 3-vessel disease. Underwent CABG on 05/12/16 with LIMA-D1, SVG-LAD, and SVG-PDA.    COPD (chronic obstructive pulmonary disease) fall 2016   Bullous changes noted on lower lung images of CT abd done by urology   Diverticulitis    Double vision 01/2020   MG ab w/u NEG.  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 Unable to have MRI due to having shrapnel in forehead.   Hyperlipidemia    statin intolerant. Pt declined advanced lipid clinic referral 08/2019, 03/2020, and  01/2021.   Hypertension    Ischemic cardiomyopathy 05/2016   EF 20-25% at the time of NSTEMI/CABG.  Repeat EF 07/2016 30-35%.  07/2017 EF 25-30%, pt intol of entresto, bidil, and BB--for ICD 03/2019.   Microscopic hematuria Fall 2016   CT nl except nonobstructing stones.  Cystoscopy normal 12/30/14 (Dr. 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  Urol (Dr. Nechama Guard) at [facility]; Dr. Nechama Guard referred him to Dr. Laverle Patter 03/2015: patient got bilat nerve sparing , robot assisted laparoscopic radical prostatectomy and pelvic lymphadenectomy.  Urol f/u, PSA surveillance showing very mild uptrend of PSA but not to the level of biochemical recurrence as of 05/2021 urology follow-up   RLS (restless legs syndrome)    Tobacco dependence    down to 1-2 cigs per day as of 11/2015.  Restarted 08/2016.   Urinary retention 05/2015   occurred s/p foley removal    Past Surgical History:  Procedure Laterality Date   aortic ultrasound  07/2012   2014 NO AAA.  01/2021-->4 cm AAA with R common iliac ectasia-->vasc surg ref   COLONOSCOPY  approx 2011 per pt   Done by surgeon in [place] after a bout of diverticulitis; normal per pt report--was told to repeat in 10 yrs.   CORONARY ARTERY BYPASS GRAFT N/A 05/12/2016   Procedure: CORONARY ARTERY BYPASS GRAFTING (CABG) TIMES THREE USING LEFT INTERNAL MAMMARY ARTERY AND RIGHT SAPHENOUS LEG VEIN HARVESTED ENDOSCOPICALLY;  Surgeon: Loreli Slot, MD;  Location: [facility] OR;  Service: Open Heart Surgery;  Laterality: N/A;   ICD IMPLANT N/A 04/12/2019   Procedure: ICD IMPLANT;  Surgeon: Marinus Maw, MD;  Location: [facility] INVASIVE CV LAB;  Service: Cardiovascular;  Laterality: N/A;   INGUINAL HERNIA REPAIR  2003 and 2004   Both sides.   LAPAROSCOPY  2017   LEFT HEART CATH AND  CORONARY ANGIOGRAPHY N/A 05/09/2016   Procedure: Left Heart Cath and Coronary Angiography;  Surgeon: Lennette Bihari, MD;  Location[facility] INVASIVE CV LAB;  Service: Cardiovascular;  Laterality: N/A;   LITHOTRIPSY  2004   Dr. Rito Ehrlich in [place].   LYMPHADENECTOMY Bilateral 04/30/2015   Procedure: PELVIC LYMPHADENECTOMY;  Surgeon: Heloise Purpura, MD;  Location: [facility] ORS;  Service: Urology;  Laterality: Bilateral;   PROSTATE BIOPSY  02/05/2015   Prostate adenocarcinoma: pt considering treatment options as of 02/19/15   ROBOT ASSISTED LAPAROSCOPIC RADICAL PROSTATECTOMY N/A 04/30/2015   Procedure: XI ROBOTIC ASSISTED LAPAROSCOPIC RADICAL PROSTATECTOMY LEVEL 2;  Surgeon: Heloise Purpura, MD;  Location: [facility] ORS;  Service: Urology;  Laterality: N/A;   TEE WITHOUT CARDIOVERSION N/A 05/12/2016   Procedure: TRANSESOPHAGEAL ECHOCARDIOGRAM (TEE);  Surgeon: Loreli Slot, MD;  Location: [facility] OR;  Service: Open Heart Surgery;  Laterality: N/A;   TRANSTHORACIC ECHOCARDIOGRAM  03/22/2006; 05/2016; 07/2016; 07/2017; 01/02/20   2008: EF 65%, normal valves, no wall motion abnormalties, LA size normal.  05/2016 in context of  non-STEMI, EF 20-25%, mild AV and MV regurg.  08/05/16: EF 30-35%, akinesis of mid lateral myoc, grd II DD, mild aortic 07/2017: EF 25-30%, mult areas of akinesis, grd I DD. 01/2020 EF 25-30%, sev LV dsfxn, valves fine, aortic root 57mm    Outpatient Medications Prior to Visit  Medication Sig Dispense Refill   ALPRAZolam (XANAX) 0.25 MG tablet tAKE 1-2 tabLETS BY MOUTH AT BEDTIME AS NEEDED FOR insomnia/restless legs 60 tablet 5   aspirin EC 81 MG tablet Take 1 tablet (81 mg total) by mouth daily. Swallow whole. 90 tablet 3   benazepril (LOTENSIN) 20 MG tablet Take 1 tablet (20 mg total) by mouth daily. 1 tab po bid 180 tablet 3   dapagliflozin propanediol (FARXIGA) 10 MG TABS tablet Take 1 tablet (10 mg total) by mouth daily before breakfast. 30 tablet 6   furosemide (LASIX) 20 MG tablet 1 tab po bid (Patient  taking differently: Take 20 mg by mouth 2 (two) times daily. 1 tab po bid) 180 tablet 3   Omega-3 Fatty Acids (FISH OIL) 1000 MG CAPS Take 1,000 mg by mouth daily.      ondansetron (ZOFRAN) 4 MG tablet Take 1 tablet (4 mg total) by mouth every 8 (eight) hours as needed for nausea or vomiting. 20 tablet 1   Polyethyl Glycol-Propyl Glycol (LUBRICANT EYE DROPS) 0.4-0.3 % SOLN Place 1 drop into both eyes 3 (three) times daily as needed (dry/irritated eyes.).     Vitamin D, Cholecalciferol, 400 units CAPS Take 400 Units by mouth daily.      predniSONE (DELTASONE) 50 MG tablet Take 1 tab 13 hrs before, 7 hrs before and 1 hr before CT (Patient not taking: Reported on 05/17/2022) 3 tablet 0   No facility-administered medications prior to visit.    Allergies  Allergen Reactions   Entresto [Sacubitril-Valsartan] Hives and Shortness Of Breath   Amlodipine Swelling    LE swelling   Contrast Media [Iodinated Contrast Media] Other (See Comments)    Prostate problem had to wear a catheter for two weeks after having dye.    Hydralazine Palpitations and Other (See Comments)    Fatigue+    Review of Systems As per HPI  PE:    05/17/2022    9:13 AM 05/17/2022    8:43 AM 03/30/2022    1:18 PM  Vitals with BMI  Height  5\' 11"  5\' 11"   Weight  143 lbs 10 oz 147 lbs  BMI  20.04 20.51  Systolic 130 161 161  Diastolic 80 80 80  Pulse  72 64     Physical Exam  Gen: Alert, well appearing.  Patient is oriented to person, place, time, and situation. AFFECT: pleasant, lucid thought and speech. Cardiovascular: Regular rhythm, some ectopic beats, no murmur or rub. Lungs are clear bilaterally, breathing is nonlabored. Extremities show no pitting edema.  LABS:  Last CBC Lab Results  Component Value Date   WBC 6.7 12/12/2021   HGB 16.0 12/12/2021   HCT 48.5 12/12/2021   MCV 97.4 12/12/2021   MCH 32.1 12/12/2021   RDW 14.3 12/12/2021   PLT 131 (L) 12/12/2021   Last metabolic panel Lab Results   Component Value Date   GLUCOSE 83 02/18/2022   NA 139 02/18/2022   K 4.7 02/18/2022   CL 103 02/18/2022   CO2 27 02/18/2022   BUN 23 02/18/2022   CREATININE 0.98 02/18/2022   GFRNONAA >60 12/12/2021   CALCIUM 9.8 02/18/2022   PROT 6.8 12/12/2021  ALBUMIN 4.1 12/12/2021   LABGLOB 2.2 09/16/2016   AGRATIO 2.0 09/16/2016   BILITOT 1.2 12/12/2021   ALKPHOS 63 12/12/2021   AST 29 12/12/2021   ALT 24 12/12/2021   ANIONGAP 7 12/12/2021   Last lipids Lab Results  Component Value Date   CHOL 168 02/18/2022   HDL 47.70 02/18/2022   LDLCALC 110 (H) 02/18/2022   TRIG 52.0 02/18/2022   CHOLHDL 4 02/18/2022   Last hemoglobin A1c Lab Results  Component Value Date   HGBA1C 5.2 01/02/2020   Last thyroid functions Lab Results  Component Value Date   TSH 0.48 09/29/2020   IMPRESSION AND PLAN:  #1 hypertension, not ideal control but stable.  He feels weak and dizzy if his blood pressure goes below 135/75. Will continue with benazepril 20 mg twice daily. Electrolytes and creatinine today  #2 ischemic cardiomyopathy.  Appears euvolemic. He is now asymptomatic on Farxiga 10 mg a day and Lasix 20 mg twice a day. Has is status post ICD in 2021.   Electrolytes and creatinine today.  #3 abdominal aortic aneurysm CT angio abdomen scheduled for 05/24/2022 to follow-up abdominal aortic aneurysm.  An After Visit Summary was printed and given to the patient.  FOLLOW UP: Return in about 3 months (around 08/16/2022) for routine chronic illness f/u. Next cpe 02/2023  Signed:  Santiago Bumpers, MD           05/17/2022

## 2022-05-18 ENCOUNTER — Telehealth: Payer: Self-pay | Admitting: Family Medicine

## 2022-05-18 NOTE — Telephone Encounter (Signed)
Noted. Will update result note

## 2022-05-18 NOTE — Telephone Encounter (Signed)
Mr. Galey understood lab results and had not additional questions or concerns.

## 2022-05-24 ENCOUNTER — Telehealth: Payer: Self-pay

## 2022-05-24 ENCOUNTER — Ambulatory Visit (HOSPITAL_COMMUNITY)
Admission: RE | Admit: 2022-05-24 | Discharge: 2022-05-24 | Disposition: A | Discharge status: Home / Self Care | Payer: Medicare Other | Source: Ambulatory Visit | Attending: Cardiology | Admitting: Cardiology

## 2022-05-24 DIAGNOSIS — R1907 Generalized intra-abdominal and pelvic swelling, mass and lump: Secondary | ICD-10-CM | POA: Diagnosis not present

## 2022-05-24 DIAGNOSIS — R109 Unspecified abdominal pain: Secondary | ICD-10-CM | POA: Diagnosis not present

## 2022-05-24 DIAGNOSIS — N200 Calculus of kidney: Secondary | ICD-10-CM | POA: Diagnosis not present

## 2022-05-24 MED ORDER — IOHEXOL 350 MG/ML SOLN
80.0000 mL | Freq: Once | INTRAVENOUS | Status: AC | PRN
  Administered 2022-05-24: 80 mL via INTRAVENOUS

## 2022-05-24 NOTE — Telephone Encounter (Signed)
Received a call from [facility] CT calling needs a new order placed for Ct angio Abd/pelvis.Order placed.

## 2022-05-25 ENCOUNTER — Other Ambulatory Visit: Payer: Self-pay | Admitting: *Deleted

## 2022-05-25 DIAGNOSIS — I739 Peripheral vascular disease, unspecified: Secondary | ICD-10-CM

## 2022-05-26 ENCOUNTER — Telehealth: Payer: Self-pay | Admitting: Cardiology

## 2022-05-26 NOTE — Telephone Encounter (Signed)
Patient is returning call. Please advise? 

## 2022-05-26 NOTE — Telephone Encounter (Signed)
Called, no answer, left message and call back number. (Do not know exactly what it is pertaining to)

## 2022-05-26 NOTE — Telephone Encounter (Signed)
The pt wanted to know "how worried" he should be about the aneurysm of 4.0X4.2? He is a Curator, lots of straining and strenuous work; and is wondering if that is still ok. He is scared it may "pop like a balloon." I told him that it is not that fragile, but I would send this information to his provider.

## 2022-05-26 NOTE — Telephone Encounter (Signed)
Patient returned RN's call. 

## 2022-05-27 NOTE — Telephone Encounter (Signed)
Left message for pt, aneurysm is not high risk and he has no restrictions. Vascular will follow from here but nothing is usually done until over 50 mm. He is to call with questions.

## 2022-06-01 ENCOUNTER — Encounter: Payer: Self-pay | Admitting: Vascular Surgery

## 2022-06-01 ENCOUNTER — Ambulatory Visit (INDEPENDENT_AMBULATORY_CARE_PROVIDER_SITE_OTHER)
Admission: RE | Admit: 2022-06-01 | Discharge: 2022-06-01 | Disposition: A | Discharge status: Home / Self Care | Payer: Medicare Other | Source: Ambulatory Visit | Attending: Vascular Surgery | Admitting: Vascular Surgery

## 2022-06-01 ENCOUNTER — Other Ambulatory Visit: Payer: Self-pay | Admitting: Vascular Surgery

## 2022-06-01 ENCOUNTER — Ambulatory Visit (HOSPITAL_COMMUNITY)
Admission: RE | Admit: 2022-06-01 | Discharge: 2022-06-01 | Disposition: A | Discharge status: Home / Self Care | Payer: Medicare Other | Source: Ambulatory Visit | Attending: Vascular Surgery | Admitting: Vascular Surgery

## 2022-06-01 ENCOUNTER — Ambulatory Visit: Payer: Medicare Other | Admitting: Vascular Surgery

## 2022-06-01 VITALS — BP 134/86 | HR 70 | Temp 97.9°F | Wt 143.0 lb

## 2022-06-01 DIAGNOSIS — I714 Abdominal aortic aneurysm, without rupture, unspecified: Secondary | ICD-10-CM | POA: Diagnosis not present

## 2022-06-01 DIAGNOSIS — I739 Peripheral vascular disease, unspecified: Secondary | ICD-10-CM

## 2022-06-01 NOTE — Progress Notes (Signed)
Patient ID: Daniel [name]., male   DOB: 1945/09/05, 77 y.o.   MRN: [id number]  Reason for Consult: Follow-up   Referred by Jeoffrey Massed, MD  Subjective:     HPI:  Daniel [name]. is a 77 y.o. male followed here for history of abdominal aortic aneurysm that was 4 cm 1 year ago.  He now follows up with repeat duplex but CT scan was performed recently for history of prostate cancer.  No new back or abdominal pain.  He states that he does have restless legs at night but no frank claudication, rest pain or tissue loss.  He also had bilateral lower extremity arterial duplexes to evaluate for popliteal aneurysms today.  He is a lifelong smoker he mostly quit after his heart surgery several years ago but does occasionally smoke a little bit up 1 cigarette.  He really has no new complaints related to today's visit.    Past Medical History:  Diagnosis Date   Abdominal aortic aneurysm (HCC) 01/2021   4 cm, infrarenal.  Also enlarged left common iliac artery--vascular eval 03/2021->recommended u/s rpt 1 yr.   Anxiety    Atrial fibrillation (HCC)    Limited episode, emergency room, June, 2013, spontaneous conversion to sinus rhythm, was evaluated By Dr. Myrtis Ser 2013- no further visits.  Post-op (CABG) a-fib, converted on amio but pt self d/c'd this med due to DOE/side effect profile.   CAD (coronary artery disease) 05/2016   a. 05/2016: NSTEMI with cath showing 3-vessel disease. Underwent CABG on 05/12/16 with LIMA-D1, SVG-LAD, and SVG-PDA.    COPD (chronic obstructive pulmonary disease) (HCC) fall 2016   Bullous changes noted on lower lung images of CT abd done by urology   Diverticulitis    Double vision 01/2020   MG ab w/u NEG.  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 Unable to have MRI due to having shrapnel in forehead.   Hyperlipidemia    statin intolerant. Pt declined advanced lipid clinic referral 08/2019, 03/2020, and 01/2021.   Hypertension    Ischemic cardiomyopathy 05/2016   EF 20-25% at the time of NSTEMI/CABG.  Repeat EF 07/2016 30-35%.  07/2017 EF 25-30%, pt intol of entresto, bidil, and BB--for ICD 03/2019.   Microscopic hematuria Fall 2016   CT nl except nonobstructing stones.  Cystoscopy normal 12/30/14 (Dr. 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  Urol (Dr. Nechama Guard) at [facility]; Dr. Nechama Guard referred him to Dr. Laverle Patter 03/2015: patient got bilat nerve sparing , robot assisted laparoscopic radical prostatectomy and pelvic lymphadenectomy.  Urol f/u, PSA surveillance showing very mild uptrend of PSA but not to the level of biochemical recurrence as of 05/2021 urology follow-up   RLS (restless legs syndrome)    Tobacco dependence    down to 1-2 cigs per day as of 11/2015.  Restarted 08/2016.   Urinary retention 05/2015   occurred s/p foley removal   Family  History  Problem Relation Age of Onset   Hypertension Mother    Cancer - Other Mother 66       breast   Cancer Father        Lung ca age 66   Diabetes Sister    Past Surgical History:  Procedure Laterality Date   aortic ultrasound  07/2012   2014 NO AAA.  01/2021-->4 cm AAA with R common iliac ectasia-->vasc surg ref   COLONOSCOPY  approx 2011 per pt   Done by surgeon in [place] after a bout of diverticulitis; normal per pt report--was told to repeat in 10 yrs.   CORONARY ARTERY BYPASS GRAFT N/A 05/12/2016   Procedure: CORONARY ARTERY BYPASS GRAFTING (CABG) TIMES THREE USING LEFT  INTERNAL MAMMARY ARTERY AND RIGHT SAPHENOUS LEG VEIN HARVESTED ENDOSCOPICALLY;  Surgeon: Loreli Slot, MD;  Location: [facility] OR;  Service: Open Heart Surgery;  Laterality: N/A;   ICD IMPLANT N/A 04/12/2019   Procedure: ICD IMPLANT;  Surgeon: Marinus Maw, MD;  Location: [facility] INVASIVE CV LAB;  Service: Cardiovascular;  Laterality: N/A;   INGUINAL HERNIA REPAIR  2003 and 2004   Both sides.   LAPAROSCOPY  2017   LEFT HEART CATH AND CORONARY ANGIOGRAPHY N/A 05/09/2016   Procedure: Left Heart Cath and Coronary Angiography;  Surgeon: Lennette Bihari, MD;  Location[facility] INVASIVE CV LAB;  Service: Cardiovascular;  Laterality: N/A;   LITHOTRIPSY  2004   Dr. Rito Ehrlich in [place].   LYMPHADENECTOMY Bilateral 04/30/2015   Procedure: PELVIC LYMPHADENECTOMY;  Surgeon: Heloise Purpura, MD;  Location: [facility] ORS;  Service: Urology;  Laterality: Bilateral;   PROSTATE BIOPSY  02/05/2015   Prostate adenocarcinoma: pt considering treatment options as of 02/19/15   ROBOT ASSISTED LAPAROSCOPIC RADICAL PROSTATECTOMY N/A 04/30/2015   Procedure: XI ROBOTIC ASSISTED LAPAROSCOPIC RADICAL PROSTATECTOMY LEVEL 2;  Surgeon: Heloise Purpura, MD;  Location: [facility] ORS;  Service: Urology;  Laterality: N/A;   TEE WITHOUT CARDIOVERSION N/A 05/12/2016   Procedure: TRANSESOPHAGEAL ECHOCARDIOGRAM (TEE);  Surgeon: Loreli Slot, MD;  Location: [facility] OR;  Service: Open Heart Surgery;  Laterality: N/A;   TRANSTHORACIC ECHOCARDIOGRAM  03/22/2006; 05/2016; 07/2016; 07/2017; 01/02/20   2008: EF 65%, normal valves, no wall motion abnormalties, LA size normal.  05/2016 in context of non-STEMI, EF 20-25%, mild AV and MV regurg.  08/05/16: EF 30-35%, akinesis of mid lateral myoc, grd II DD, mild aortic 07/2017: EF 25-30%, mult areas of akinesis, grd I DD. 01/2020 EF 25-30%, sev LV dsfxn, valves fine, aortic root 40mm    Short Social History:  Social History   Tobacco Use   Smoking status: Some Days    Packs/day: 1.00    Years: 55.00    Additional pack  years: 0.00    Total pack years: 55.00    Types: Cigarettes   Smokeless tobacco: Never   Tobacco comments:    quit in 2017  Substance Use Topics   Alcohol use: No    Allergies  Allergen Reactions   Entresto [Sacubitril-Valsartan] Hives and Shortness Of Breath   Amlodipine Swelling    LE swelling   Contrast Media [Iodinated Contrast Media] Other (See Comments)    Prostate problem had to wear a catheter for two weeks after having dye.    Hydralazine Palpitations and Other (See Comments)    Fatigue+    Current Outpatient Medications  Medication Sig Dispense Refill   ALPRAZolam (XANAX) 0.25 MG tablet tAKE 1-2 tabLETS BY MOUTH AT BEDTIME AS NEEDED FOR insomnia/restless legs 60 tablet  5   aspirin EC 81 MG tablet Take 1 tablet (81 mg total) by mouth daily. Swallow whole. 90 tablet 3   benazepril (LOTENSIN) 20 MG tablet Take 1 tablet (20 mg total) by mouth daily. 1 tab po bid 180 tablet 3   dapagliflozin propanediol (FARXIGA) 10 MG TABS tablet Take 1 tablet (10 mg total) by mouth daily before breakfast. 30 tablet 6   furosemide (LASIX) 20 MG tablet 1 tab po bid (Patient taking differently: Take 20 mg by mouth 2 (two) times daily. 1 tab po bid) 180 tablet 3   Omega-3 Fatty Acids (FISH OIL) 1000 MG CAPS Take 1,000 mg by mouth daily.      ondansetron (ZOFRAN) 4 MG tablet Take 1 tablet (4 mg total) by mouth every 8 (eight) hours as needed for nausea or vomiting. 20 tablet 1   Polyethyl Glycol-Propyl Glycol (LUBRICANT EYE DROPS) 0.4-0.3 % SOLN Place 1 drop into both eyes 3 (three) times daily as needed (dry/irritated eyes.).     predniSONE (DELTASONE) 50 MG tablet Take 1 tab 13 hrs before, 7 hrs before and 1 hr before CT 3 tablet 0   Vitamin D, Cholecalciferol, 400 units CAPS Take 400 Units by mouth daily.      No current facility-administered medications for this visit.    Review of Systems  Constitutional: Positive for fatigue.  HENT: HENT negative.  Eyes: Eyes negative.  Respiratory:  Respiratory negative.  Cardiovascular: Cardiovascular negative.  Musculoskeletal:       Restless legs at night Skin: Skin negative.  Neurological: Neurological negative. Hematologic: Hematologic/lymphatic negative.  Psychiatric: Psychiatric negative.        Objective:  Objective   Vitals:   06/01/22 1344  BP: 134/86  Pulse: 70  Temp: 97.9 F (36.6 C)  TempSrc: Temporal  SpO2: 97%  Weight: 143 lb (64.9 kg)   Body mass index is 19.94 kg/m.  Physical Exam HENT:     Head: Normocephalic.     Nose: Nose normal.  Eyes:     Pupils: Pupils are equal, round, and reactive to light.  Cardiovascular:     Pulses:          Radial pulses are 2+ on the right side and 2+ on the left side.       Popliteal pulses are 2+ on the right side and 2+ on the left side.       Dorsalis pedis pulses are 1+ on the right side and 1+ on the left side.  Pulmonary:     Effort: Pulmonary effort is normal.  Abdominal:     General: Abdomen is flat.     Palpations: Abdomen is soft.  Skin:    General: Skin is warm.     Capillary Refill: Capillary refill takes less than 2 seconds.  Neurological:     General: No focal deficit present.     Mental Status: He is alert.  Psychiatric:        Mood and Affect: Mood normal.        Behavior: Behavior normal.        Thought Content: Thought content normal.        Judgment: Judgment normal.     Data: Abdominal Aorta Findings:  +-----------+-------+----------+----------+--------+--------+--------+  Location  AP (cm)Trans (cm)PSV (cm/s)WaveformThrombusComments  +-----------+-------+----------+----------+--------+--------+--------+  Proximal  2.48   2.16      77                                  +-----------+-------+----------+----------+--------+--------+--------+  Mid       2.88   2.96      23                                  +-----------+-------+----------+----------+--------+--------+--------+  Distal    4.19   4.20      33                                   +-----------+-------+----------+----------+--------+--------+--------+  RT CIA Prox2.0    2.1       43                                  +-----------+-------+----------+----------+--------+--------+--------+  LT CIA Prox2.2    2.2       43                                  +-----------+-------+----------+----------+--------+--------+--------+         Summary:  Abdominal Aorta: There is evidence of abnormal dilatation of the distal  Abdominal aorta. The largest aortic diameter remains essentially unchanged  compared to prior exam. Previous diameter measurement was 4.2 cm obtained  on 05/24/2022.  Dilatation of the right and left common iliac arteries.   Right PoplitealAP (cm)Transv (cm)WaveformStenosisShapeComments  +---------------+-------+-----------+--------+--------+-----+--------+  Proximal      0.81   0.69                                      +---------------+-------+-----------+--------+--------+-----+--------+  Mid           1.10   1.11                                      +---------------+-------+-----------+--------+--------+-----+--------+  Distal        0.94   0.95                                      +---------------+-------+-----------+--------+--------+-----+--------+   +--------------+-------+-----------+--------+--------+-----+--------+  Left PoplitealAP (cm)Transv (cm)WaveformStenosisShapeComments  +--------------+-------+-----------+--------+--------+-----+--------+  Proximal     1.14   1.19                                      +--------------+-------+-----------+--------+--------+-----+--------+  Mid          1.03   1.07                                      +--------------+-------+-----------+--------+--------+-----+--------+  Distal       0.89   0.79                                       +--------------+-------+-----------+--------+--------+-----+--------+          Summary:  Right: Patent popliteal artery with maximum diameter measuring  approximately  1.1 cm in diameter.   Left: Patent popliteal artery with maximum diameter measuring  approximately 1.19 cm in diameter.      Assessment/Plan:    77 year old male with 4 cm abdominal aortic aneurysm with recent CT scan to compare.  We reviewed his CT scan together today in the presence of his son.  We discussed the signs and symptoms of aneurysm rupture as well as the need for CT scan with the region 5 cm.  We will not need to recheck popliteal pulses in the future from an aortic aneurysm standpoint we will follow-up in 1 year with repeat duplex.  His son was present today he was a lifelong smoker he is 77 years old and I counseled him to discuss aneurysm surveillance with his doctor as well.     Maeola Harman MD [facility]

## 2022-06-10 ENCOUNTER — Other Ambulatory Visit: Payer: Self-pay

## 2022-06-10 DIAGNOSIS — I714 Abdominal aortic aneurysm, without rupture, unspecified: Secondary | ICD-10-CM

## 2022-07-08 ENCOUNTER — Ambulatory Visit (INDEPENDENT_AMBULATORY_CARE_PROVIDER_SITE_OTHER): Payer: Medicare Other

## 2022-07-08 DIAGNOSIS — I255 Ischemic cardiomyopathy: Secondary | ICD-10-CM | POA: Diagnosis not present

## 2022-07-08 LAB — CUP PACEART REMOTE DEVICE CHECK
Battery Remaining Longevity: 56 mo
Battery Remaining Percentage: 56 %
Battery Voltage: 2.96 V
Brady Statistic AP VP Percent: 61 %
Brady Statistic AP VS Percent: 31 %
Brady Statistic AS VP Percent: 1.4 %
Brady Statistic AS VS Percent: 5 %
Brady Statistic RA Percent Paced: 90 %
Brady Statistic RV Percent Paced: 62 %
Date Time Interrogation Session: 20240607030033
HighPow Impedance: 63 Ohm
Implantable Lead Connection Status: 753985
Implantable Lead Connection Status: 753985
Implantable Lead Implant Date: 20210312
Implantable Lead Implant Date: 20210312
Implantable Lead Location: 753859
Implantable Lead Location: 753860
Implantable Pulse Generator Implant Date: 20210312
Lead Channel Impedance Value: 410 Ohm
Lead Channel Impedance Value: 440 Ohm
Lead Channel Pacing Threshold Amplitude: 0.375 V
Lead Channel Pacing Threshold Amplitude: 1 V
Lead Channel Pacing Threshold Pulse Width: 0.5 ms
Lead Channel Pacing Threshold Pulse Width: 0.5 ms
Lead Channel Sensing Intrinsic Amplitude: 12 mV
Lead Channel Sensing Intrinsic Amplitude: 2.3 mV
Lead Channel Setting Pacing Amplitude: 1.25 V
Lead Channel Setting Pacing Amplitude: 1.375
Lead Channel Setting Pacing Pulse Width: 0.5 ms
Lead Channel Setting Sensing Sensitivity: 0.5 mV
Pulse Gen Serial Number: 111016652
Zone Setting Status: 755011

## 2022-07-14 ENCOUNTER — Other Ambulatory Visit: Payer: Self-pay | Admitting: Nurse Practitioner

## 2022-07-14 DIAGNOSIS — H6123 Impacted cerumen, bilateral: Secondary | ICD-10-CM | POA: Diagnosis not present

## 2022-07-14 DIAGNOSIS — H6983 Other specified disorders of Eustachian tube, bilateral: Secondary | ICD-10-CM | POA: Diagnosis not present

## 2022-07-20 ENCOUNTER — Other Ambulatory Visit: Payer: Self-pay | Admitting: Family Medicine

## 2022-07-20 NOTE — Telephone Encounter (Signed)
Refill requested for Xanax sent to [facility] pharmacy. Next ov 7/16

## 2022-07-26 NOTE — Progress Notes (Signed)
Remote ICD transmission.   

## 2022-08-15 NOTE — Patient Instructions (Signed)
It was very nice to see you today!   PLEASE NOTE:   If you had any lab tests please let us know if you have not heard back within a few days. You may see your results on MyChart before we have a chance to review them but we will give you a call once they are reviewed by us. If we ordered any referrals today, please let us know if you have not heard from their office within the next 2 weeks. You should receive a letter via MyChart confirming if the referral was approved and their office contact information to schedule.  

## 2022-08-16 ENCOUNTER — Encounter: Payer: Self-pay | Admitting: Family Medicine

## 2022-08-16 ENCOUNTER — Ambulatory Visit: Payer: Medicare Other | Admitting: Family Medicine

## 2022-08-16 VITALS — BP 140/82 | HR 74 | Temp 98.1°F | Wt 141.8 lb

## 2022-08-16 DIAGNOSIS — R300 Dysuria: Secondary | ICD-10-CM

## 2022-08-16 DIAGNOSIS — G2581 Restless legs syndrome: Secondary | ICD-10-CM | POA: Diagnosis not present

## 2022-08-16 DIAGNOSIS — R3129 Other microscopic hematuria: Secondary | ICD-10-CM

## 2022-08-16 DIAGNOSIS — I10 Essential (primary) hypertension: Secondary | ICD-10-CM

## 2022-08-16 DIAGNOSIS — I255 Ischemic cardiomyopathy: Secondary | ICD-10-CM

## 2022-08-16 DIAGNOSIS — H6991 Unspecified Eustachian tube disorder, right ear: Secondary | ICD-10-CM

## 2022-08-16 DIAGNOSIS — N200 Calculus of kidney: Secondary | ICD-10-CM | POA: Diagnosis not present

## 2022-08-16 LAB — POCT URINALYSIS DIPSTICK
Bilirubin, UA: NEGATIVE
Blood, UA: POSITIVE
Glucose, UA: POSITIVE — AB
Ketones, UA: NEGATIVE
Leukocytes, UA: NEGATIVE
Nitrite, UA: NEGATIVE
Protein, UA: NEGATIVE
Spec Grav, UA: 1.02 (ref 1.010–1.025)
Urobilinogen, UA: 0.2 E.U./dL
pH, UA: 5 (ref 5.0–8.0)

## 2022-08-16 LAB — URINALYSIS, ROUTINE W REFLEX MICROSCOPIC
Bilirubin Urine: NEGATIVE
Ketones, ur: NEGATIVE
Leukocytes,Ua: NEGATIVE
Nitrite: NEGATIVE
RBC / HPF: NONE SEEN (ref 0–?)
Specific Gravity, Urine: 1.02 (ref 1.000–1.030)
Total Protein, Urine: NEGATIVE
Urine Glucose: 500 — AB
Urobilinogen, UA: 0.2 (ref 0.0–1.0)
pH: 5.5 (ref 5.0–8.0)

## 2022-08-16 LAB — BASIC METABOLIC PANEL
BUN: 24 mg/dL — ABNORMAL HIGH (ref 6–23)
CO2: 26 mEq/L (ref 19–32)
Calcium: 10.3 mg/dL (ref 8.4–10.5)
Chloride: 105 mEq/L (ref 96–112)
Creatinine, Ser: 0.99 mg/dL (ref 0.40–1.50)
GFR: 73.59 mL/min (ref >60.00)
Glucose, Bld: 96 mg/dL (ref 70–99)
Potassium: 4.9 mEq/L (ref 3.5–5.1)
Sodium: 139 mEq/L (ref 135–145)

## 2022-08-16 MED ORDER — ALPRAZOLAM 0.25 MG PO TABS: ORAL_TABLET | ORAL | 1 refills | Status: DC

## 2022-08-16 NOTE — Progress Notes (Signed)
OFFICE VISIT  08/16/2022  CC:  Chief Complaint  Patient presents with   Follow-up    3 month follow up. He has been having ear pain and nasal congestion for about 4 weeks now. He was seen at urgent care and was given amoxicillin which did not clear it up. He is also having burning with urination.    Patient is a 77 y.o. male who presents for 81-month follow-up hypertension, restless leg syndrome, and ischemic cardiomyopathy. A/P as of last visit: "#1 hypertension, not ideal control but stable.  He feels weak and dizzy if his blood pressure goes below 135/75. Will continue with benazepril 20 mg twice daily. Electrolytes and creatinine today   #2 ischemic cardiomyopathy.  Appears euvolemic. He is now asymptomatic on Farxiga 10 mg a day and Lasix 20 mg twice a day. Has is status post ICD in 2021.   Electrolytes and creatinine today.   #3 abdominal aortic aneurysm CT angio abdomen scheduled for 05/24/2022 to follow-up abdominal aortic aneurysm."  INTERIM HX: He is doing pretty well overall.  About 1 month ago he bailed hay and not long after he began to feel some right nasal congestion and right ear fullness.  No ear pain, no fever, no cough. He was seen at an urgent care and prescribed a course of amoxicillin.  Not much change in the symptoms. He feels the ear popping on and off.  Home blood pressures are stable: Often normal, also some mild elevations usually in the morning.  Later in the afternoon it is normal. He denies any swelling.  Last 2 to 3 days he has felt some right flank pain radiating across the right side and groin.  Mild intensity.  He feels like it has let up this morning.  He does burn some when he urinates.  He has not seen blood in his urine. When the symptoms started he got over-the-counter Azo.  He states he has not taken this in a couple of days though. His urine was orange but is now yellow. He does have a history of kidney stones.  On a CT angio abdomen pelvis  done recently for following aortic aneurysm there was a 6 mm nonobstructing right lower pole kidney stone noted.   PMP AWARE reviewed today: most recent rx for alprazolam 0.25 mg was filled 07/21/2022, # 60, rx by me. No red flags.  ROS as above, plus--> no CP, no SOB, no wheezing, no cough, no dizziness, no HAs, no rashes, no melena/hematochezia.  No polyuria or polydipsia.  No myalgias or arthralgias.  No focal weakness, paresthesias, or tremors.  No acute vision or hearing abnormalities.   No recent changes in lower legs. No n/v/d or abd pain.  No palpitations.    Past Medical History:  Diagnosis Date   Abdominal aortic aneurysm (HCC) 01/2021   4 cm, infrarenal.  Also enlarged left common iliac artery--vascular eval 03/2021->rpt aortic u/s 06/01/22 stable Aorta 4.2 cm.  Bilat common iliac aneurisms and left common femoral aneurism-->f/u 1 yr   Anxiety    Atrial fibrillation [facility])    Limited episode, emergency room, June, 2013, spontaneous conversion to sinus rhythm, was evaluated By Dr. Myrtis Ser 2013- no further visits.  Post-op (CABG) a-fib, converted on amio but pt self d/c'd this med due to DOE/side effect profile.   CAD (coronary artery disease) 05/2016   a. 05/2016: NSTEMI with cath showing 3-vessel disease. Underwent CABG on 05/12/16 with LIMA-D1, SVG-LAD, and SVG-PDA.    COPD (chronic obstructive  pulmonary disease) (HCC) fall 2016   Bullous changes noted on lower lung images of CT abd done by urology   Diverticulitis    Double vision 01/2020   MG ab w/u NEG.  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 Unable to have MRI due to having shrapnel in forehead.   Hyperlipidemia     statin intolerant. Pt declined advanced lipid clinic referral 08/2019, 03/2020, and 01/2021.   Hypertension    Ischemic cardiomyopathy 05/2016   EF 20-25% at the time of NSTEMI/CABG.  Repeat EF 07/2016 30-35%.  07/2017 EF 25-30%, pt intol of entresto, bidil, and BB--for ICD 03/2019.   Microscopic hematuria Fall 2016   CT nl except nonobstructing stones.  Cystoscopy normal 12/30/14 (Dr. 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  Urol (Dr. Nechama Guard) at [facility]; Dr. Nechama Guard referred him to Dr. Laverle Patter 03/2015: patient got bilat nerve sparing , robot assisted laparoscopic radical prostatectomy and pelvic lymphadenectomy.  Urol f/u, PSA surveillance showing very mild uptrend of PSA but not to the level of biochemical recurrence as of 05/2021 urology follow-up   RLS (restless legs syndrome)    Tobacco dependence    down to 1-2 cigs per day as of 11/2015.  Restarted 08/2016.   Urinary retention 05/2015   occurred s/p foley removal    Past Surgical History:  Procedure Laterality Date   aortic ultrasound  07/2012   2014 NO AAA.  01/2021-->4 cm AAA with R common iliac ectasia-->vasc surg ref   COLONOSCOPY  approx 2011 per pt   Done by surgeon in [place] after a bout of diverticulitis; normal per pt report--was told to repeat in 10 yrs.   CORONARY ARTERY BYPASS GRAFT N/A 05/12/2016   Procedure: CORONARY ARTERY BYPASS GRAFTING (CABG) TIMES THREE USING LEFT INTERNAL MAMMARY ARTERY AND RIGHT SAPHENOUS LEG VEIN HARVESTED ENDOSCOPICALLY;  Surgeon: Loreli Slot, MD;  Location: [facility] OR;  Service: Open Heart Surgery;  Laterality: N/A;   ICD IMPLANT N/A 04/12/2019   Procedure: ICD IMPLANT;  Surgeon: Marinus Maw, MD;  Location: [facility] INVASIVE CV LAB;  Service: Cardiovascular;  Laterality: N/A;    INGUINAL HERNIA REPAIR  2003 and 2004   Both sides.   LAPAROSCOPY  2017   LEFT HEART CATH AND CORONARY ANGIOGRAPHY N/A 05/09/2016   Procedure: Left Heart Cath and Coronary Angiography;  Surgeon: Lennette Bihari, MD;  Location: [facility] INVASIVE CV LAB;  Service: Cardiovascular;  Laterality: N/A;   LITHOTRIPSY  2004   Dr. Rito Ehrlich in [place].   LYMPHADENECTOMY Bilateral 04/30/2015   Procedure: PELVIC LYMPHADENECTOMY;  Surgeon: Heloise Purpura, MD;  Location: [facility] ORS;  Service: Urology;  Laterality: Bilateral;   PROSTATE BIOPSY  02/05/2015   Prostate adenocarcinoma: pt considering treatment options as of 02/19/15   ROBOT ASSISTED LAPAROSCOPIC RADICAL PROSTATECTOMY N/A 04/30/2015   Procedure: XI ROBOTIC ASSISTED LAPAROSCOPIC RADICAL PROSTATECTOMY LEVEL 2;  Surgeon: Heloise Purpura, MD;  Location: [facility] ORS;  Service: Urology;  Laterality: N/A;   TEE WITHOUT CARDIOVERSION N/A 05/12/2016   Procedure: TRANSESOPHAGEAL ECHOCARDIOGRAM (TEE);  Surgeon: Loreli Slot, MD;  Location: [facility] OR;  Service: Open Heart Surgery;  Laterality: N/A;   TRANSTHORACIC ECHOCARDIOGRAM  03/22/2006; 05/2016; 07/2016; 07/2017; 01/02/20   2008: EF 65%, normal valves, no wall motion abnormalties, LA size normal.  05/2016 in context of non-STEMI, EF 20-25%, mild AV and MV regurg.  08/05/16: EF 30-35%, akinesis of mid lateral myoc, grd II DD, mild aortic 07/2017: EF 25-30%, mult areas of akinesis, grd I DD. 01/2020 EF 25-30%, sev LV dsfxn, valves fine, aortic root 40mm    Outpatient Medications Prior to Visit  Medication Sig Dispense Refill   aspirin EC 81 MG tablet Take 1 tablet (81 mg total) by mouth daily. Swallow whole. 90 tablet 3   benazepril (LOTENSIN) 20 MG tablet Take 1 tablet (20 mg total) by mouth daily. 1 tab po bid 180 tablet 3   dapagliflozin propanediol (FARXIGA) 10 MG TABS tablet TAKE ONE TABLET BY MOUTH DAILY BEFORE BREAKFAST 30 tablet 11   furosemide (LASIX) 20 MG tablet 1 tab po bid (Patient taking differently: Take 20 mg by  mouth 2 (two) times daily. 1 tab po bid) 180 tablet 3   Omega-3 Fatty Acids (FISH OIL) 1000 MG CAPS Take 1,000 mg by mouth daily.      ondansetron (ZOFRAN) 4 MG tablet Take 1 tablet (4 mg total) by mouth every 8 (eight) hours as needed for nausea or vomiting. 20 tablet 1   Polyethyl Glycol-Propyl Glycol (LUBRICANT EYE DROPS) 0.4-0.3 % SOLN Place 1 drop into both eyes 3 (three) times daily as needed (dry/irritated eyes.).     Vitamin D, Cholecalciferol, 400 units CAPS Take 400 Units by mouth daily.      predniSONE (DELTASONE) 50 MG tablet Take 1 tab 13 hrs before, 7 hrs before and 1 hr before CT (Patient not taking: Reported on 08/16/2022) 3 tablet 0   ALPRAZolam (XANAX) 0.25 MG tablet tAKE 1-2 tabLETS BY MOUTH AT BEDTIME AS NEEDED FOR insomnia/restless legs 60 tablet 1   No facility-administered medications prior to visit.    Allergies  Allergen Reactions   Entresto [Sacubitril-Valsartan] Hives and Shortness Of Breath   Amlodipine Swelling    LE swelling   Contrast Media [Iodinated Contrast Media] Other (See Comments)    Prostate problem had to wear a catheter for two weeks after having dye.    Hydralazine Palpitations and Other (See Comments)    Fatigue+    Review of Systems As per HPI  PE:    08/16/2022    9:59 AM 08/16/2022    9:50 AM 06/01/2022    1:44 PM  Vitals with BMI  Weight  141 lbs 13 oz 143 lbs  BMI   19.95  Systolic 140 146 161  Diastolic 82 78 86  Pulse  74 70     Physical Exam  Gen: Alert, well appearing.  Patient is oriented to person, place, time, and situation. ENT:  EACs and Tms normal. Eyes: no injection, icteris, swelling, or exudate.  EOMI, PERRLA. Mouth: lips without lesion/swelling.  Oral mucosa pink and moist. Oropharynx without erythema, exudate, or swelling.  No sinus tenderness.  No nasal mucous or swelling. CV: RRR, some ectopy is heard, no m/r/g.   LUNGS: CTA bilat, nonlabored resps, good aeration in all lung fields. No flank or abd  tenderness. EXT: no clubbing or cyanosis.  no edema.   LABS:  Last CBC Lab Results  Component Value Date   WBC 6.7 12/12/2021   HGB 16.0 12/12/2021   HCT 48.5 12/12/2021   MCV 97.4 12/12/2021   MCH 32.1 12/12/2021   RDW 14.3 12/12/2021   PLT 131 (L) 12/12/2021   Last metabolic panel Lab Results  Component Value Date   GLUCOSE 88 05/17/2022   NA 137 05/17/2022   K 4.4 05/17/2022   CL 101 05/17/2022   CO2 28 05/17/2022   BUN 25 (H) 05/17/2022   CREATININE 0.93 05/17/2022   GFR 79.46 05/17/2022   CALCIUM 10.0 05/17/2022   PROT 6.8 12/12/2021   ALBUMIN 4.1 12/12/2021   LABGLOB 2.2 09/16/2016   AGRATIO 2.0 09/16/2016   BILITOT 1.2 12/12/2021   ALKPHOS 63 12/12/2021   AST 29 12/12/2021   ALT 24 12/12/2021   ANIONGAP 7 12/12/2021   Last lipids Lab Results  Component Value Date   CHOL 168 02/18/2022   HDL 47.70 02/18/2022   LDLCALC 110 (H) 02/18/2022   TRIG 52.0 02/18/2022   CHOLHDL 4 02/18/2022   Last hemoglobin A1c Lab Results  Component Value Date   HGBA1C 5.2 01/02/2020   Last thyroid functions Lab Results  Component Value Date   TSH 0.48 09/29/2020   Lab Results  Component Value Date   PSA 0.11 11/17/2021   PSA 0.15 06/22/2021   PSA 0.10 02/04/2021   IMPRESSION AND PLAN:  #1 hypertension, not ideal control but stable.  He feels weak and dizzy if his blood pressure goes below 135/75. Will continue with benazepril 20 mg twice daily. Electrolytes and creatinine today   #2 ischemic cardiomyopathy.  Asymptomatic. Appears euvolemic. Continue Farxiga 10 mg a day, benazepril 20 mg twice daily, And Lasix 20 mg twice a day. Has is status post ICD in 2021.   Electrolytes and creatinine today.  #3 restless legs syndrome, stable on alprazolam 0.25 mg, 1-2 nightly as needed.  #4 nephrolithiasis.  Sounds like he is currently passing a stone, it is perhaps now in his bladder. UA showing microscopic hematuria makes sense but do not know why glucosuria is  showing up.  His urine is light yellow and he has not had Azo in 2 days. He has never had problems with hyperglycemia. Checking serum glucose today.  #5 right eustachian tube dysfunction. Recommended over-the-counter Flonase.  An After Visit Summary was printed and given to the patient.  FOLLOW UP: Return in about 3 months (around 11/16/2022) for routine chronic illness f/u. Next CPE January 2025 Signed:  Santiago Bumpers, MD           08/16/2022

## 2022-08-17 LAB — URINE CULTURE
MICRO NUMBER:: 15206482
SPECIMEN QUALITY:: ADEQUATE

## 2022-08-21 ENCOUNTER — Other Ambulatory Visit: Payer: Self-pay

## 2022-08-21 ENCOUNTER — Emergency Department (HOSPITAL_COMMUNITY)
Admission: EM | Admit: 2022-08-21 | Discharge: 2022-08-21 | Disposition: A | Discharge status: Home / Self Care | Payer: Medicare Other | Attending: Emergency Medicine | Admitting: Emergency Medicine

## 2022-08-21 ENCOUNTER — Encounter (HOSPITAL_COMMUNITY): Payer: Self-pay

## 2022-08-21 ENCOUNTER — Emergency Department (HOSPITAL_COMMUNITY): Payer: Medicare Other

## 2022-08-21 DIAGNOSIS — Z7982 Long term (current) use of aspirin: Secondary | ICD-10-CM | POA: Insufficient documentation

## 2022-08-21 DIAGNOSIS — Z950 Presence of cardiac pacemaker: Secondary | ICD-10-CM | POA: Insufficient documentation

## 2022-08-21 DIAGNOSIS — I110 Hypertensive heart disease with heart failure: Secondary | ICD-10-CM | POA: Insufficient documentation

## 2022-08-21 DIAGNOSIS — I2510 Atherosclerotic heart disease of native coronary artery without angina pectoris: Secondary | ICD-10-CM | POA: Insufficient documentation

## 2022-08-21 DIAGNOSIS — I509 Heart failure, unspecified: Secondary | ICD-10-CM | POA: Insufficient documentation

## 2022-08-21 DIAGNOSIS — J449 Chronic obstructive pulmonary disease, unspecified: Secondary | ICD-10-CM | POA: Diagnosis not present

## 2022-08-21 DIAGNOSIS — R002 Palpitations: Secondary | ICD-10-CM | POA: Diagnosis not present

## 2022-08-21 DIAGNOSIS — Z8546 Personal history of malignant neoplasm of prostate: Secondary | ICD-10-CM | POA: Insufficient documentation

## 2022-08-21 DIAGNOSIS — Z95810 Presence of automatic (implantable) cardiac defibrillator: Secondary | ICD-10-CM | POA: Diagnosis not present

## 2022-08-21 LAB — CBC WITH DIFFERENTIAL/PLATELET
Abs Immature Granulocytes: 0.02 10*3/uL (ref 0.00–0.07)
Basophils Absolute: 0 10*3/uL (ref 0.0–0.1)
Basophils Relative: 1 %
Eosinophils Absolute: 0.1 10*3/uL (ref 0.0–0.5)
Eosinophils Relative: 2 %
HCT: 46.1 % (ref 39.0–52.0)
Hemoglobin: 15.7 g/dL (ref 13.0–17.0)
Immature Granulocytes: 0 %
Lymphocytes Relative: 23 %
Lymphs Abs: 1.1 10*3/uL (ref 0.7–4.0)
MCH: 33.1 pg (ref 26.0–34.0)
MCHC: 34.1 g/dL (ref 30.0–36.0)
MCV: 97.1 fL (ref 80.0–100.0)
Monocytes Absolute: 0.3 10*3/uL (ref 0.1–1.0)
Monocytes Relative: 7 %
Neutro Abs: 3.2 10*3/uL (ref 1.7–7.7)
Neutrophils Relative %: 67 %
Platelets: 120 10*3/uL — ABNORMAL LOW (ref 150–400)
RBC: 4.75 MIL/uL (ref 4.22–5.81)
RDW: 13.8 % (ref 11.5–15.5)
WBC: 4.7 10*3/uL (ref 4.0–10.5)
nRBC: 0 % (ref 0.0–0.2)

## 2022-08-21 LAB — BASIC METABOLIC PANEL
Anion gap: 6 (ref 5–15)
BUN: 23 mg/dL (ref 8–23)
CO2: 24 mmol/L (ref 22–32)
Calcium: 9.3 mg/dL (ref 8.9–10.3)
Chloride: 106 mmol/L (ref 98–111)
Creatinine, Ser: 0.92 mg/dL (ref 0.61–1.24)
GFR, Estimated: >60 mL/min (ref >60)
Glucose, Bld: 95 mg/dL (ref 70–99)
Potassium: 4.5 mmol/L (ref 3.5–5.1)
Sodium: 136 mmol/L (ref 135–145)

## 2022-08-21 LAB — TROPONIN I (HIGH SENSITIVITY)
Troponin I (High Sensitivity): 23 ng/L — ABNORMAL HIGH (ref <18)
Troponin I (High Sensitivity): 20 ng/L — ABNORMAL HIGH (ref <18)

## 2022-08-21 LAB — CBG MONITORING, ED: Glucose-Capillary: 99 mg/dL (ref 70–99)

## 2022-08-21 LAB — MAGNESIUM: Magnesium: 2.1 mg/dL (ref 1.7–2.4)

## 2022-08-21 MED ORDER — METOPROLOL SUCCINATE ER 25 MG PO TB24: 25.0000 mg | ORAL_TABLET | Freq: Every day | ORAL | 2 refills | Status: DC

## 2022-08-21 MED ORDER — METOPROLOL SUCCINATE ER 25 MG PO TB24
25.0000 mg | ORAL_TABLET | Freq: Once | ORAL | Status: AC
  Administered 2022-08-21: 25 mg via ORAL
  Filled 2022-08-21: qty 1

## 2022-08-21 MED ORDER — METOPROLOL TARTRATE 5 MG/5ML IV SOLN: 5.0000 mg | Freq: Once | INTRAVENOUS | Status: DC

## 2022-08-21 NOTE — ED Notes (Signed)
Pacemake interrogation attempted with st.jude reader. Unable to read properly. Will call rep.

## 2022-08-21 NOTE — ED Notes (Signed)
Pt being discharged and recommended to follow up with cardiologist. Pt states his readings (interrogations at home are checking out fine). Representative notified and cancelled in person check today.

## 2022-08-21 NOTE — Discharge Instructions (Addendum)
A prescription for metoprolol was sent to your pharmacy.  This can help minimize the PVCs that were noted on your cardiac monitor today.  Take daily as prescribed.  Dr. Ladona Ridgel would like to see you in the office.  Call the telephone number below to set up that appointment.  Return to the emergency department for any new or worsening symptoms of concern.

## 2022-08-21 NOTE — ED Notes (Signed)
Interrogation of pacemaker not successful. Contacted representative and he is coming here to manually interrogate. States he will be here in a little over an hour.

## 2022-08-21 NOTE — ED Triage Notes (Signed)
Pt states heart began to pound last night. States it has happened before but it resolved itself, unlike this time. C/o palpitations and overall "uneasy" feeling

## 2022-08-21 NOTE — ED Provider Notes (Signed)
[place] EMERGENCY DEPARTMENT AT [facility] Provider Note   CSN: [id number] Arrival date & time: 08/21/22  1026     History  Chief Complaint  Patient presents with   Pacemaker Problem   Weakness    Daniel Baiz. is a 77 y.o. male.  HPI Patient presents for palpitations.  Medical history includes HTN, GERD, BPH, atrial fibrillation, CAD, HLD, prostate cancer, COPD, CHF, pacemaker placement, AAA.  Starting last night, he would have intermittent palpitations, described as a intense intermittent beating of his heart.  This has continued today, although less intense.  He denies any other new recent symptoms.  He states that he does have ongoing weakness in his legs, present for several months.    Home Medications Prior to Admission medications   Medication Sig Start Date End Date Taking? Authorizing Provider  metoprolol succinate (TOPROL-XL) 25 MG 24 hr tablet Take 1 tablet (25 mg total) by mouth daily. 08/21/22 11/19/22 Yes Gloris Manchester, MD  ALPRAZolam Prudy Feeler) 0.25 MG tablet tAKE 1-2 tabLETS BY MOUTH AT BEDTIME AS NEEDED FOR insomnia/restless legs 08/16/22   McGowen, Maryjean Morn, MD  aspirin EC 81 MG tablet Take 1 tablet (81 mg total) by mouth daily. Swallow whole. 02/05/20   Rollene Rotunda, MD  benazepril (LOTENSIN) 20 MG tablet Take 1 tablet (20 mg total) by mouth daily. 1 tab po bid 11/17/21   McGowen, Maryjean Morn, MD  dapagliflozin propanediol (FARXIGA) 10 MG TABS tablet TAKE ONE TABLET BY MOUTH DAILY BEFORE BREAKFAST 07/14/22   Swinyer, Zachary George, NP  furosemide (LASIX) 20 MG tablet 1 tab po bid Patient taking differently: Take 20 mg by mouth 2 (two) times daily. 1 tab po bid 02/18/22   McGowen, Maryjean Morn, MD  Omega-3 Fatty Acids (FISH OIL) 1000 MG CAPS Take 1,000 mg by mouth daily.     [provider]  ondansetron (ZOFRAN) 4 MG tablet Take 1 tablet (4 mg total) by mouth every 8 (eight) hours as needed for nausea or vomiting. 10/06/20   McGowen, Maryjean Morn, MD  Polyethyl  Glycol-Propyl Glycol (LUBRICANT EYE DROPS) 0.4-0.3 % SOLN Place 1 drop into both eyes 3 (three) times daily as needed (dry/irritated eyes.).    [provider]  predniSONE (DELTASONE) 50 MG tablet Take 1 tab 13 hrs before, 7 hrs before and 1 hr before CT Patient not taking: Reported on 08/16/2022 03/30/22   Rollene Rotunda, MD  Vitamin D, Cholecalciferol, 400 units CAPS Take 400 Units by mouth daily.     [provider]  Cetirizine HCl (ZYRTEC ALLERGY) 10 MG CAPS Take 1 capsule (10 mg total) by mouth daily. 11/06/14 11/06/14  Rolan Bucco, MD      Allergies    Entresto [sacubitril-valsartan], Amlodipine, Contrast media [iodinated contrast media], and Hydralazine    Review of Systems   Review of Systems  Cardiovascular:  Positive for palpitations.  All other systems reviewed and are negative.   Physical Exam Updated Vital Signs BP (!) 153/99 (BP Location: Right Arm)   Pulse 75   Temp 97.9 F (36.6 C) (Oral)   Resp 18   Ht 5\' 11"  (1.803 m)   Wt 64.4 kg   SpO2 99%   BMI 19.80 kg/m  Physical Exam Vitals and nursing note reviewed.  Constitutional:      General: He is not in acute distress.    Appearance: Normal appearance. He is well-developed. He is not ill-appearing, toxic-appearing or diaphoretic.  HENT:     Head: Normocephalic  and atraumatic.     Right Ear: External ear normal.     Left Ear: External ear normal.     Nose: Nose normal.     Mouth/Throat:     Mouth: Mucous membranes are moist.  Eyes:     Extraocular Movements: Extraocular movements intact.     Conjunctiva/sclera: Conjunctivae normal.  Cardiovascular:     Rate and Rhythm: Normal rate and regular rhythm.     Pulses: Normal pulses.     Heart sounds: No murmur heard. Pulmonary:     Effort: Pulmonary effort is normal. No respiratory distress.     Breath sounds: Normal breath sounds. No wheezing or rales.  Chest:     Chest wall: No tenderness.  Abdominal:     General: There is no distension.      Palpations: Abdomen is soft.     Tenderness: There is no abdominal tenderness.  Musculoskeletal:        General: No swelling. Normal range of motion.     Cervical back: Normal range of motion and neck supple.     Right lower leg: No edema.     Left lower leg: No edema.  Skin:    General: Skin is warm and dry.     Capillary Refill: Capillary refill takes less than 2 seconds.     Coloration: Skin is not jaundiced or pale.  Neurological:     General: No focal deficit present.     Mental Status: He is alert and oriented to person, place, and time.  Psychiatric:        Mood and Affect: Mood normal.        Behavior: Behavior normal.     ED Results / Procedures / Treatments   Labs (all labs ordered are listed, but only abnormal results are displayed) Labs Reviewed  CBC WITH DIFFERENTIAL/PLATELET - Abnormal; Notable for the following components:      Result Value   Platelets 120 (*)    All other components within normal limits  TROPONIN I (HIGH SENSITIVITY) - Abnormal; Notable for the following components:   Troponin I (High Sensitivity) 23 (*)    All other components within normal limits  TROPONIN I (HIGH SENSITIVITY) - Abnormal; Notable for the following components:   Troponin I (High Sensitivity) 20 (*)    All other components within normal limits  BASIC METABOLIC PANEL  MAGNESIUM  CBG MONITORING, ED    EKG EKG Interpretation Date/Time:  Sunday August 21 2022 10:43:13 EDT Ventricular Rate:  74 PR Interval:  233 QRS Duration:  156 QT Interval:  414 QTC Calculation: 460 R Axis:   -69  Text Interpretation: VENTRICULAR PACED RHYTHM Ventricular premature complex Prolonged PR interval LVH with secondary repolarization abnormality Confirmed by Gloris Manchester [phone]) on 08/21/2022 11:08:40 AM  Radiology DG Chest Port 1 View  Result Date: 08/21/2022 CLINICAL DATA:  Palpitations. EXAM: PORTABLE CHEST 1 VIEW COMPARISON:  12/12/2021 FINDINGS: Lungs are hyperexpanded. The lungs are  clear without focal pneumonia, edema, pneumothorax or pleural effusion. Interstitial markings are diffusely coarsened with chronic features. The cardiopericardial silhouette is upper normal to mildly increased in size. Left permanent pacemaker/AICD again noted. Telemetry leads overlie the chest. IMPRESSION: Hyperexpansion with chronic interstitial coarsening. No acute cardiopulmonary findings. Electronically Signed   By: Kennith Center M.D.   On: 08/21/2022 11:25    Procedures Procedures    Medications Ordered in ED Medications  metoprolol succinate (TOPROL-XL) 24 hr tablet 25 mg (has no administration in time range)  ED Course/ Medical Decision Making/ A&P                             Medical Decision Making Amount and/or Complexity of Data Reviewed Labs: ordered. Radiology: ordered. ECG/medicine tests: ordered.  Risk Prescription drug management.   This patient presents to the ED for concern of palpitations, this involves an extensive number of treatment options, and is a complaint that carries with it a high risk of complications and morbidity.  The differential diagnosis includes arrhythmia, PVCs, pacemaker dysfunction, anxiety   Co morbidities that complicate the patient evaluation  HTN, GERD, BPH, atrial fibrillation, CAD, HLD, prostate cancer, COPD, CHF, pacemaker placement, AAA   Additional history obtained:  Additional history obtained from N/A External records from outside source obtained and reviewed including EMR   Lab Tests:  I Ordered, and personally interpreted labs.  The pertinent results include: Normal electrolytes, normal hemoglobin, no leukocytosis normal troponins   Imaging Studies ordered:  I ordered imaging studies including chest x-ray I independently visualized and interpreted imaging which showed no acute findings I agree with the radiologist interpretation   Cardiac Monitoring: / EKG:  The patient was maintained on a cardiac monitor.  I  personally viewed and interpreted the cardiac monitored which showed an underlying rhythm of: Paced rhythm with frequent PVCs   Consultations Obtained:  I requested consultation with the cardiologist, Dr. Ladona Ridgel,  and discussed lab and imaging findings as well as pertinent plan - they recommend: Initiation of Toprol 25 mg/day and outpatient follow-up.   Problem List / ED Course / Critical interventions / Medication management  Patient presenting for palpitations, starting last night.  On arrival in the ED, patient is well-appearing.  He denies any current symptoms but states that he has had ongoing intermittent palpitations this morning of lower intensity.  Current breathing is unlabored.  Lungs are clear to auscultation.  No irregular heartbeat is noted on initial exam.  Patient was placed on bedside cardiac monitor.  He also states that he has had ongoing leg weakness bilaterally for the past several months.  He is followed by vascular surgery for his AAA.  He was last seen by vascular surgery in the office in early May.  He underwent bilateral popliteal duplex studies at that time.  Currently, distal lower extremities are well-perfused.  DP pulses are palpable.  Lab workup was initiated.  Patient remained on cardiac monitor.  Will attempt pacemaker interrogation.  Pacemaker interrogation was unsuccessful.  Patient was, however, noted to have frequent PVCs on cardiac monitor.  These would typically occur in groups of 2-4.  Longest run was 6.  I spoke with cardiologist on-call, Dr. Ladona Ridgel, who knows this patient.  Dr. Ladona Ridgel states that PVCs are expected given his enlarged heart.  He does agree with initiation of metoprolol and outpatient follow-up.  Per chart review, he was previously on a beta-blocker 6 years ago.  This was stopped due to low heart rate.  He has since had pacemaker placed.  Pacemaker is set to 70 as low heart rate.  Bradycardia should no longer be an issue.  Initial dose of metoprolol  was ordered in the ED.  Patient was prescribed metoprolol to take daily.  He was discharged in stable condition. I ordered medication including metoprolol for frequent PVCs Reevaluation of the patient after these medicines showed that the patient stayed the same I have reviewed the patients home medicines and have made adjustments  as needed   Social Determinants of Health:  Has access to outpatient care         Final Clinical Impression(s) / ED Diagnoses Final diagnoses:  Palpitations    Rx / DC Orders ED Discharge Orders          Ordered    Ambulatory referral to Cardiology       Comments: If you have not heard from the Cardiology office within the next 72 hours please call [phone].   08/21/22 1311    metoprolol succinate (TOPROL-XL) 25 MG 24 hr tablet  Daily        08/21/22 1325              Gloris Manchester, MD 08/21/22 1331

## 2022-08-22 ENCOUNTER — Telehealth: Payer: Self-pay

## 2022-08-22 NOTE — Telephone Encounter (Signed)
Transition Care Management Unsuccessful Follow-up Telephone Call  Date of discharge and from where:  [facility]   Attempts:  1st Attempt  Reason for unsuccessful TCM follow-up call:  No answer/busy   Lenard Forth [facility] Guide, [facility] [phone] [address] [place], [place], [place] [address] Phone: [phone] Email: [email or web address]

## 2022-08-23 ENCOUNTER — Telehealth: Payer: Self-pay

## 2022-08-23 NOTE — Telephone Encounter (Signed)
Transition Care Management Unsuccessful Follow-up Telephone Call  Date of discharge and from where:  [facility]  Attempts:  2nd Attempt  Reason for unsuccessful TCM follow-up call:  No answer/busy   Lenard Forth [facility] Guide, [facility] [phone] [address] [place], [place], [place] [address] Phone: [phone] Email: [email or web address]

## 2022-08-24 ENCOUNTER — Telehealth: Payer: Self-pay

## 2022-08-24 NOTE — Telephone Encounter (Signed)
Pt called to see how his labs were. Urine results were given but nothing about his labs. Please advise

## 2022-08-24 NOTE — Telephone Encounter (Signed)
Left pt VM

## 2022-08-24 NOTE — Telephone Encounter (Signed)
His blood tests at last visit were all normal.

## 2022-08-25 NOTE — Telephone Encounter (Signed)
Patient understood results and had no additional questions or concerns

## 2022-08-26 ENCOUNTER — Encounter (HOSPITAL_COMMUNITY): Payer: Self-pay

## 2022-08-26 ENCOUNTER — Emergency Department (HOSPITAL_COMMUNITY): Payer: Medicare Other

## 2022-08-26 ENCOUNTER — Other Ambulatory Visit: Payer: Self-pay

## 2022-08-26 ENCOUNTER — Emergency Department (HOSPITAL_COMMUNITY)
Admission: EM | Admit: 2022-08-26 | Discharge: 2022-08-26 | Disposition: A | Discharge status: Home / Self Care | Payer: Medicare Other | Attending: Emergency Medicine | Admitting: Emergency Medicine

## 2022-08-26 DIAGNOSIS — Z743 Need for continuous supervision: Secondary | ICD-10-CM | POA: Diagnosis not present

## 2022-08-26 DIAGNOSIS — R1110 Vomiting, unspecified: Secondary | ICD-10-CM | POA: Diagnosis not present

## 2022-08-26 DIAGNOSIS — I509 Heart failure, unspecified: Secondary | ICD-10-CM | POA: Insufficient documentation

## 2022-08-26 DIAGNOSIS — R0602 Shortness of breath: Secondary | ICD-10-CM | POA: Diagnosis not present

## 2022-08-26 DIAGNOSIS — I2510 Atherosclerotic heart disease of native coronary artery without angina pectoris: Secondary | ICD-10-CM | POA: Diagnosis not present

## 2022-08-26 DIAGNOSIS — Z79899 Other long term (current) drug therapy: Secondary | ICD-10-CM | POA: Diagnosis not present

## 2022-08-26 DIAGNOSIS — R6889 Other general symptoms and signs: Secondary | ICD-10-CM | POA: Diagnosis not present

## 2022-08-26 DIAGNOSIS — Z95810 Presence of automatic (implantable) cardiac defibrillator: Secondary | ICD-10-CM | POA: Diagnosis not present

## 2022-08-26 DIAGNOSIS — I110 Hypertensive heart disease with heart failure: Secondary | ICD-10-CM | POA: Diagnosis not present

## 2022-08-26 DIAGNOSIS — I493 Ventricular premature depolarization: Secondary | ICD-10-CM | POA: Diagnosis not present

## 2022-08-26 DIAGNOSIS — Z7982 Long term (current) use of aspirin: Secondary | ICD-10-CM | POA: Diagnosis not present

## 2022-08-26 DIAGNOSIS — I517 Cardiomegaly: Secondary | ICD-10-CM | POA: Diagnosis not present

## 2022-08-26 DIAGNOSIS — R002 Palpitations: Secondary | ICD-10-CM | POA: Diagnosis not present

## 2022-08-26 DIAGNOSIS — R531 Weakness: Secondary | ICD-10-CM | POA: Diagnosis not present

## 2022-08-26 LAB — CBC WITH DIFFERENTIAL/PLATELET
Abs Immature Granulocytes: 0.02 10*3/uL (ref 0.00–0.07)
Basophils Absolute: 0 10*3/uL (ref 0.0–0.1)
Basophils Relative: 1 %
Eosinophils Absolute: 0.1 10*3/uL (ref 0.0–0.5)
Eosinophils Relative: 2 %
HCT: 48.3 % (ref 39.0–52.0)
Hemoglobin: 15.7 g/dL (ref 13.0–17.0)
Immature Granulocytes: 0 %
Lymphocytes Relative: 19 %
Lymphs Abs: 1.2 10*3/uL (ref 0.7–4.0)
MCH: 32.3 pg (ref 26.0–34.0)
MCHC: 32.5 g/dL (ref 30.0–36.0)
MCV: 99.4 fL (ref 80.0–100.0)
Monocytes Absolute: 0.4 10*3/uL (ref 0.1–1.0)
Monocytes Relative: 7 %
Neutro Abs: 4.4 10*3/uL (ref 1.7–7.7)
Neutrophils Relative %: 71 %
Platelets: 131 10*3/uL — ABNORMAL LOW (ref 150–400)
RBC: 4.86 MIL/uL (ref 4.22–5.81)
RDW: 14.2 % (ref 11.5–15.5)
WBC: 6.2 10*3/uL (ref 4.0–10.5)
nRBC: 0 % (ref 0.0–0.2)

## 2022-08-26 LAB — BASIC METABOLIC PANEL
Anion gap: 14 (ref 5–15)
BUN: 26 mg/dL — ABNORMAL HIGH (ref 8–23)
CO2: 23 mmol/L (ref 22–32)
Calcium: 9.4 mg/dL (ref 8.9–10.3)
Chloride: 103 mmol/L (ref 98–111)
Creatinine, Ser: 0.97 mg/dL (ref 0.61–1.24)
GFR, Estimated: >60 mL/min (ref >60)
Glucose, Bld: 92 mg/dL (ref 70–99)
Potassium: 4.1 mmol/L (ref 3.5–5.1)
Sodium: 140 mmol/L (ref 135–145)

## 2022-08-26 LAB — MAGNESIUM: Magnesium: 2.1 mg/dL (ref 1.7–2.4)

## 2022-08-26 LAB — TROPONIN I (HIGH SENSITIVITY)
Troponin I (High Sensitivity): 28 ng/L — ABNORMAL HIGH (ref <18)
Troponin I (High Sensitivity): 21 ng/L — ABNORMAL HIGH (ref <18)

## 2022-08-26 LAB — BRAIN NATRIURETIC PEPTIDE: B Natriuretic Peptide: 1703.6 pg/mL — ABNORMAL HIGH (ref 0.0–100.0)

## 2022-08-26 MED ORDER — FUROSEMIDE 10 MG/ML IJ SOLN
40.0000 mg | Freq: Once | INTRAMUSCULAR | Status: AC
  Administered 2022-08-26: 40 mg via INTRAVENOUS
  Filled 2022-08-26: qty 4

## 2022-08-26 MED ORDER — METOPROLOL SUCCINATE ER 25 MG PO TB24: 25.0000 mg | ORAL_TABLET | Freq: Two times a day (BID) | ORAL | 0 refills | Status: DC

## 2022-08-26 NOTE — ED Provider Notes (Signed)
  Physical Exam  BP 120/83   Pulse (!) 26   Temp 98.4 F (36.9 C) (Oral)   Resp 13   SpO2 96%   Physical Exam  Procedures  Procedures  ED Course / MDM    Medical Decision Making Care assumed at 3 pm.  Patient is here with palpitations.  Patient's troponin is stable.  BNP is chronically elevated at 1700.  Patient was given Lasix.  Signed out pending cardiology evaluation as well as pacemaker interrogation report.  3:46 PM Cardiology is at bedside.  They interrogated the pacemaker and there were no events.  They recommend increase metoprolol to 25 mg twice daily.  Problems Addressed: Palpitations: acute illness or injury  Amount and/or Complexity of Data Reviewed Labs: ordered. Decision-making details documented in ED Course. Radiology: ordered and independent interpretation performed. Decision-making details documented in ED Course. ECG/medicine tests: ordered and independent interpretation performed. Decision-making details documented in ED Course.  Risk Prescription drug management. Decision regarding hospitalization.           Charlynne Pander, MD 08/26/22 [phone]

## 2022-08-26 NOTE — Discharge Instructions (Signed)
We have increased your metoprolol to 25 mg twice daily  Please call cardiology office for follow-up  Return to ER if you have worse palpitations or chest pain or shortness of breath.

## 2022-08-26 NOTE — Consult Note (Addendum)
Cardiology Consultation   Patient ID: Daniel [name]. MRN: [id number]; DOB: 02/26/1945  Admit date: 08/26/2022 Date of Consult: 08/26/2022  PCP:  Jeoffrey Massed, MD   [facility] Providers Cardiologist:  Rollene Rotunda, MD  Electrophysiologist:  Lewayne Bunting, MD       Patient Profile:   Daniel [name]. is a 77 y.o. male with a hx of CAD s/p CABG with LIMA-D1, SVG-LAD, SVG-PDA on 05/12/2016, postop atrial fibrillation, hypertension, hyperlipidemia intolerant of statin, ICM s/p St Jude dual-chamber ICD 04/12/2019, AAA followed by Dr. Randie Heinz and history of prostate cancer who is being seen 08/26/2022 for the evaluation of orthopnea, palpitation and leg weakness at the request of Dr. Rhunette Croft.  History of Present Illness:   [name] is a 77 year old male with past medical history of CAD s/p CABG with LIMA-D1, SVG-LAD, SVG-PDA on 05/12/2016, postop atrial fibrillation, hypertension, hyperlipidemia intolerant of statin, ICM s/p St Jude dual-chamber ICD 04/12/2019, AAA followed by Dr. Randie Heinz and history of prostate cancer.  Cardiac catheterization performed on 05/09/2016 demonstrated EF 20 to 25%, akinesis of the distal inferoapical segment with significant hypokinesis globally consistent with ischemic cardiomyopathy, multivessel disease was 70% followed by total occlusion of large LAD with distal collaterals, 50% proximal left circumflex lesion with total occlusion of distal left circumflex artery, total occlusion of mid RCA with distal collateral arteries.  He underwent successful CABG x 3 by Dr. Dorris Fetch on 05/12/2017.  Postop course complicated by atrial fibrillation treated with IV amiodarone that resulted in conversion to sinus rhythm prior to discharge.  Repeat echocardiogram in July 2018 showed EF remained low at 30 to 35%.  He was initially placed on carvedilol and Entresto along with spironolactone.  He stopped Entresto after being hospitalized in October 2018 with shortness of breath  and a diffuse rash with presyncope.  He was convinced Sherryll Burger was the culprit behind his rash.  He went back to benazepril. He was taking carvedilol at the time which was later discontinued due to bradycardia.  He was also placed on low-dose bisoprolol at some point. He switched cardiologist from Dr. Tresa Endo to Dr. Antoine Poche in [place]'s office due to closer proximity to where he lives.   He underwent St Jude dual-chamber ICD implantation by Dr. Ladona Ridgel in March 2021.  He was last seen by Dr. Antoine Poche on 03/22/2022 at which time he was on Farxiga, benazepril, Lasix and aspirin.  He was doing well at the time.  A month ago, he had some sinus issues.  He was seen at [facility], ED on 08/21/2022 due to complaint of 1 week onset of intermittent palpitation.  Serial troponin was negative.  The chest x-ray was normal.  Kidney function and electrolytes stable.  The case was discussed with on-call cardiologist Dr. Ladona Ridgel who recommended at discharge on metoprolol succinate 25 mg daily.  Patient also complains of leg weakness as well.  Due to persistent symptoms, he returned back to the hospital on 08/26/2022.  He says since the last ED visit, he started having orthopnea but denies any PND.  He says he can roughly walk 300 feet on flat surface before his legs give out.  The weakness in his leg is symmetrical on both side.  He denies any fever, chills or cough.  On arrival to [facility], ED, blood work was negative. BNP was 1703.  Serial troponin 28--21.  EKG showed paced rhythm with mild ST elevation in V5 and V6.  Patient denies any chest pain.  He was given 40 mg IV Lasix and his symptom improved.  On exam, his heart is regular.  His lung is clear from the base to the apex.  He has no lower extremity edema.  He has no significant JVD.  He appears to be euvolemic on exam.  Device interrogation is has been reviewed, he has frequent PVCs, but no prolonged VT or VF since Apr 2023.  According to the patient, he has been having  increased palpitation for the past several weeks.  He describes the palpitations as irregular heartbeat.  On telemetry, he has very frequent PVCs.  This likely contributed to some of his palpitation.  His breathing has improved after a single dose of IV Lasix.    Past Medical History:  Diagnosis Date   Abdominal aortic aneurysm (HCC) 01/2021   4 cm, infrarenal.  Also enlarged left common iliac artery--vascular eval 03/2021->rpt aortic u/s 06/01/22 stable Aorta 4.2 cm.  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 Myrtis Ser 2013- no further visits.  Post-op (CABG) a-fib, converted on amio but pt self d/c'd this med due to DOE/side effect profile.   CAD (coronary artery disease) 05/2016   a. 05/2016: NSTEMI with cath showing 3-vessel disease. Underwent CABG on 05/12/16 with LIMA-D1, SVG-LAD, and SVG-PDA.    COPD (chronic obstructive pulmonary disease) (HCC) fall 2016   Bullous changes noted on lower lung images of CT abd done by urology   Diverticulitis    Double vision 01/2020   MG ab w/u NEG.  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 Unable to have MRI due to having shrapnel in forehead.   Hyperlipidemia    statin intolerant. Pt declined advanced lipid clinic referral 08/2019, 03/2020, and 01/2021.   Hypertension    Ischemic cardiomyopathy 05/2016   EF 20-25% at the time of NSTEMI/CABG.  Repeat EF 07/2016 30-35%.  07/2017 EF 25-30%, pt intol of entresto, bidil, and BB--for ICD 03/2019.   Microscopic hematuria Fall 2016    CT nl except nonobstructing stones.  Cystoscopy normal 12/30/14 (Dr. 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  Urol (Dr. Nechama Guard) at [facility]; Dr. Nechama Guard referred him to Dr. Laverle Patter 03/2015: patient got bilat nerve sparing , robot assisted laparoscopic radical prostatectomy and pelvic lymphadenectomy.  Urol f/u, PSA surveillance showing very mild uptrend of PSA but not to the level of biochemical recurrence as of 05/2021 urology follow-up   RLS (restless legs syndrome)    Tobacco dependence    down to 1-2 cigs per day as of 11/2015.  Restarted 08/2016.   Urinary retention 05/2015   occurred s/p foley removal    Past Surgical History:  Procedure Laterality Date   aortic ultrasound  07/2012   2014 NO AAA.  01/2021-->4 cm AAA  with R common iliac ectasia-->vasc surg ref   COLONOSCOPY  approx 2011 per pt   Done by surgeon in [place] after a bout of diverticulitis; normal per pt report--was told to repeat in 10 yrs.   CORONARY ARTERY BYPASS GRAFT N/A 05/12/2016   Procedure: CORONARY ARTERY BYPASS GRAFTING (CABG) TIMES THREE USING LEFT INTERNAL MAMMARY ARTERY AND RIGHT SAPHENOUS LEG VEIN HARVESTED ENDOSCOPICALLY;  Surgeon: Loreli Slot, MD;  Location: [facility];  Service: Open Heart Surgery;  Laterality: N/A;   ICD IMPLANT N/A 04/12/2019   Procedure: ICD IMPLANT;  Surgeon: Marinus Maw, MD;  Location: [facility] INVASIVE CV LAB;  Service: Cardiovascular;  Laterality: N/A;   INGUINAL HERNIA REPAIR  2003 and 2004   Both sides.   LAPAROSCOPY  2017   LEFT HEART CATH AND CORONARY ANGIOGRAPHY N/A 05/09/2016   Procedure: Left Heart Cath and Coronary Angiography;  Surgeon: Lennette Bihari, MD;  Location: [facility] INVASIVE CV LAB;  Service: Cardiovascular;  Laterality: N/A;   LITHOTRIPSY  2004    Dr. Rito Ehrlich in [place].   LYMPHADENECTOMY Bilateral 04/30/2015   Procedure: PELVIC LYMPHADENECTOMY;  Surgeon: Heloise Purpura, MD;  Location: [facility] ORS;  Service: Urology;  Laterality: Bilateral;   PROSTATE BIOPSY  02/05/2015   Prostate adenocarcinoma: pt considering treatment options as of 02/19/15   ROBOT ASSISTED LAPAROSCOPIC RADICAL PROSTATECTOMY N/A 04/30/2015   Procedure: XI ROBOTIC ASSISTED LAPAROSCOPIC RADICAL PROSTATECTOMY LEVEL 2;  Surgeon: Heloise Purpura, MD;  Location: [facility] ORS;  Service: Urology;  Laterality: N/A;   TEE WITHOUT CARDIOVERSION N/A 05/12/2016   Procedure: TRANSESOPHAGEAL ECHOCARDIOGRAM (TEE);  Surgeon: Loreli Slot, MD;  Location: [facility] OR;  Service: Open Heart Surgery;  Laterality: N/A;   TRANSTHORACIC ECHOCARDIOGRAM  03/22/2006; 05/2016; 07/2016; 07/2017; 01/02/20   2008: EF 65%, normal valves, no wall motion abnormalties, LA size normal.  05/2016 in context of non-STEMI, EF 20-25%, mild AV and MV regurg.  08/05/16: EF 30-35%, akinesis of mid lateral myoc, grd II DD, mild aortic 07/2017: EF 25-30%, mult areas of akinesis, grd I DD. 01/2020 EF 25-30%, sev LV dsfxn, valves fine, aortic root 40mm     Home Medications:  Prior to Admission medications   Medication Sig Start Date End Date Taking? Authorizing Provider  metoprolol succinate (TOPROL-XL) 25 MG 24 hr tablet Take 1 tablet (25 mg total) by mouth in the morning and at bedtime. 08/26/22  Yes Charlynne Pander, MD  ALPRAZolam Prudy Feeler) 0.25 MG tablet tAKE 1-2 tabLETS BY MOUTH AT BEDTIME AS NEEDED FOR insomnia/restless legs 08/16/22   McGowen, Maryjean Morn, MD  aspirin EC 81 MG tablet Take 1 tablet (81 mg total) by mouth daily. Swallow whole. 02/05/20   Rollene Rotunda, MD  benazepril (LOTENSIN) 20 MG tablet Take 1 tablet (20 mg total) by mouth daily. 1 tab po bid 11/17/21   McGowen, Maryjean Morn, MD  dapagliflozin propanediol (FARXIGA) 10 MG TABS tablet TAKE ONE TABLET BY MOUTH DAILY BEFORE BREAKFAST 07/14/22   Swinyer, Zachary George, NP   furosemide (LASIX) 20 MG tablet 1 tab po bid Patient taking differently: Take 20 mg by mouth 2 (two) times daily. 1 tab po bid 02/18/22   McGowen, Maryjean Morn, MD  Omega-3 Fatty Acids (FISH OIL) 1000 MG CAPS Take 1,000 mg by mouth daily.     [provider]  ondansetron (ZOFRAN) 4 MG tablet Take 1 tablet (4 mg total) by mouth every 8 (eight) hours as needed for nausea or vomiting. 10/06/20   McGowen, Maryjean Morn, MD  Polyethyl Glycol-Propyl  Glycol (LUBRICANT EYE DROPS) 0.4-0.3 % SOLN Place 1 drop into both eyes 3 (three) times daily as needed (dry/irritated eyes.).    [provider]  predniSONE (DELTASONE) 50 MG tablet Take 1 tab 13 hrs before, 7 hrs before and 1 hr before CT Patient not taking: Reported on 08/16/2022 03/30/22   Rollene Rotunda, MD  Vitamin D, Cholecalciferol, 400 units CAPS Take 400 Units by mouth daily.     [provider]  Cetirizine HCl (ZYRTEC ALLERGY) 10 MG CAPS Take 1 capsule (10 mg total) by mouth daily. 11/06/14 11/06/14  Rolan Bucco, MD    Inpatient Medications: Scheduled Meds:  Continuous Infusions:  PRN Meds:   Allergies:    Allergies  Allergen Reactions   Entresto [Sacubitril-Valsartan] Hives and Shortness Of Breath   Amlodipine Swelling    LE swelling   Contrast Media [Iodinated Contrast Media] Other (See Comments)    Prostate problem had to wear a catheter for two weeks after having dye.    Hydralazine Palpitations and Other (See Comments)    Fatigue+    Social History:   Social History   Socioeconomic History   Marital status: Married    Spouse name: Not on file   Number of children: Not on file   Years of education: Not on file   Highest education level: Not on file  Occupational History   Not on file  Tobacco Use   Smoking status: Some Days    Current packs/day: 1.00    Average packs/day: 1 pack/day for 55.0 years (55.0 ttl pk-yrs)    Types: Cigarettes   Smokeless tobacco: Never   Tobacco comments:    quit in 2017   Vaping Use   Vaping status: Never Used  Substance and Sexual Activity   Alcohol use: No   Drug use: No   Sexual activity: Not on file  Other Topics Concern   Not on file  Social History Narrative   Married, 2 grown children.   HS education.  Orig from [place], lives there now.   Occupation: maintenance.  Drives a tractor a lot, drives a pickup truck a lot, rides horses a lot.   Tobacco: 20 pack-yr hx (current as of 07/2012)   No drugs.   Alcohol: none   Social Determinants of Health   Financial Resource Strain: Low Risk  (09/29/2021)   Overall Financial Resource Strain (CARDIA)    Difficulty of Paying Living Expenses: Not hard at all  Food Insecurity: No Food Insecurity (09/29/2021)   Hunger Vital Sign    Worried About Running Out of Food in the Last Year: Never true    Ran Out of Food in the Last Year: Never true  Transportation Needs: No Transportation Needs (09/29/2021)   PRAPARE - Administrator, Civil Service (Medical): No    Lack of Transportation (Non-Medical): No  Physical Activity: Inactive (09/29/2021)   Exercise Vital Sign    Days of Exercise per Week: 0 days    Minutes of Exercise per Session: 0 min  Stress: No Stress Concern Present (09/29/2021)   [employer] of Occupational Health - Occupational Stress Questionnaire    Feeling of Stress : Not at all  Social Connections: Moderately Integrated (09/29/2021)   Social Connection and Isolation Panel [NHANES]    Frequency of Communication with Friends and Family: More than three times a week    Frequency of Social Gatherings with Friends and Family: More than three times a week    Attends Religious  Services: More than 4 times per year    Active Member of Clubs or Organizations: No    Attends Banker Meetings: Never    Marital Status: Married  Catering manager Violence: Not At Risk (09/29/2021)   Humiliation, Afraid, Rape, and Kick questionnaire    Fear of Current or Ex-Partner: No     Emotionally Abused: No    Physically Abused: No    Sexually Abused: No    Family History:    Family History  Problem Relation Age of Onset   Hypertension Mother    Cancer - Other Mother 67       breast   Cancer Father        Lung ca age 31   Diabetes Sister      ROS:  Please see the history of present illness.   All other ROS reviewed and negative.     Physical Exam/Data:   Vitals:   08/26/22 1015 08/26/22 1115 08/26/22 1151 08/26/22 1245  BP: (!) 146/77 (!) 139/96  (!) 155/80  Pulse: 71 99  (!) 29  Resp: 18 11  15   Temp:   98.4 F (36.9 C)   TempSrc:   Oral   SpO2: 99% 95%  99%    Intake/Output Summary (Last 24 hours) at 08/26/2022 1450 Last data filed at 08/26/2022 1321 Gross per 24 hour  Intake --  Output 850 ml  Net -850 ml      08/21/2022   10:51 AM 08/16/2022    9:50 AM 06/01/2022    1:44 PM  Last 3 Weights  Weight (lbs) 142 lb 141 lb 12.8 oz 143 lb  Weight (kg) 64.411 kg 64.32 kg 64.864 kg     There is no height or weight on file to calculate BMI.  General:  Well nourished, well developed, in no acute distress HEENT: normal Neck: no JVD Vascular: No carotid bruits; Distal pulses 2+ bilaterally Cardiac:  irregular; no murmur  Lungs:  clear to auscultation bilaterally, no wheezing, rhonchi or rales  Abd: soft, nontender, no hepatomegaly  Ext: no edema Musculoskeletal:  No deformities, BUE and BLE strength normal and equal Skin: warm and dry  Neuro:  CNs 2-12 intact, no focal abnormalities noted Psych:  Normal affect   EKG:  The EKG was personally reviewed and demonstrates:  AV paced rhythm occasional PVCs. Telemetry:  Telemetry was personally reviewed and demonstrates: Sinus rhythm alternating with paced rhythm with very frequent PVCs on telemetry.  Relevant CV Studies:  Echo 01/02/2020  1. Global hypokinesis with akinesis of the inferolateral wall; overall  severe LV dysfunction; no LV thrombus noted using definity.   2. Left ventricular ejection  fraction, by estimation, is 20 to 25%. The  left ventricle has severely decreased function. The left ventricle  demonstrates regional wall motion abnormalities (see scoring  diagram/findings for description). The left  ventricular internal cavity size was moderately dilated. Left ventricular  diastolic parameters are indeterminate.   3. Right ventricular systolic function is normal. The right ventricular  size is normal.   4. Left atrial size was mildly dilated.   5. The mitral valve is normal in structure. No evidence of mitral valve  regurgitation. No evidence of mitral stenosis.   6. The aortic valve is tricuspid. Aortic valve regurgitation is mild.  Mild aortic valve sclerosis is present, with no evidence of aortic valve  stenosis.   7. Aortic dilatation noted. There is mild dilatation of the aortic root,  measuring 40 mm.  8. The inferior vena cava is normal in size with greater than 50%  respiratory variability, suggesting right atrial pressure of 3 mmHg.    Laboratory Data:  High Sensitivity Troponin:   Recent Labs  Lab 08/21/22 1115 08/21/22 1228 08/26/22 0930 08/26/22 1007  TROPONINIHS 23* 20* 28* 21*     Chemistry Recent Labs  Lab 08/21/22 1115 08/26/22 0930  NA 136 140  K 4.5 4.1  CL 106 103  CO2 24 23  GLUCOSE 95 92  BUN 23 26*  CREATININE 0.92 0.97  CALCIUM 9.3 9.4  MG 2.1 2.1  GFRNONAA >60 >60  ANIONGAP 6 14    No results for input(s): "PROT", "ALBUMIN", "AST", "ALT", "ALKPHOS", "BILITOT" in the last 168 hours. Lipids No results for input(s): "CHOL", "TRIG", "HDL", "LABVLDL", "LDLCALC", "CHOLHDL" in the last 168 hours.  Hematology Recent Labs  Lab 08/21/22 1115 08/26/22 0807  WBC 4.7 6.2  RBC 4.75 4.86  HGB 15.7 15.7  HCT 46.1 48.3  MCV 97.1 99.4  MCH 33.1 32.3  MCHC 34.1 32.5  RDW 13.8 14.2  PLT 120* 131*   Thyroid No results for input(s): "TSH", "FREET4" in the last 168 hours.  BNP Recent Labs  Lab 08/26/22 0813  BNP 1,703.6*     DDimer No results for input(s): "DDIMER" in the last 168 hours.   Radiology/Studies:  DG Chest Port 1 View  Result Date: 08/26/2022 CLINICAL DATA:  Shortness of breath, palpitations. EXAM: PORTABLE CHEST 1 VIEW COMPARISON:  08/21/2022. FINDINGS: Left chest AICD/pacemaker with leads projecting over the right atrium and ventricle. Clear lungs. Decreased cardiomegaly. Stable mediastinal contours with postoperative changes of median sternotomy and CABG. No pleural effusion or pneumothorax. Visualized bones and upper abdomen are unremarkable. IMPRESSION: No evidence of acute cardiopulmonary disease. Decreased cardiomegaly. Electronically Signed   By: Orvan Falconer M.D.   On: 08/26/2022 09:32     Assessment and Plan:   Palpitation: Likely related to frequent underlying PVCs.  Will increase metoprolol succinate further to 25 mg twice daily.  If heart rate remains uncontrolled, may consider initiation of amiodarone in the future.  Orthopnea: Received a single dose of IV Lasix 40 mg.  His lung is clear on physical exam, no JVD, no lower extremity edema.  Patient appears to be euvolemic on exam.  Chest x-ray was normal.  BNP was elevated to 1700, however his BNP is chronically elevated.  Consider updating echocardiogram as outpatient.  CAD s/p CABG: Denies any chest pain.  Serial enzyme chronically borderline elevated however remained flat.  Postop atrial fibrillation: Monitor for recurrence of atrial fibrillation.  Hypertension: Increase metoprolol succinate to 25 mg twice daily.  Hyperlipidemia: Intolerant of statins.  Ischemic cardiomyopathy s/p dual-chamber Saint Jude ICD: Device interrogated in the emergency room.  No recent VT or VF since April 2023.   Risk Assessment/Risk Scores:        For questions or updates, please contact [facility] Please consult [email or web address] for contact info under  Signed, Azalee Course, [place]  08/26/2022 2:50 PM  Patient seen and examined, note  reviewed with the signed Advanced Practice Provider. I personally reviewed laboratory data, imaging studies and relevant notes. I independently examined the patient and formulated the important aspects of the plan. I have personally discussed the plan with the patient and/or family. Comments or changes to the note/plan are indicated below.  Patient seen at his bedside. Reports significant improvement with IV Lasix given in the ED. He has PRN Lasix at home. Have advise  taking this if he has recurrent SOB at home. His palpitations description consistent with PVC and noted frequent PVC on tele - its symptomatic - will increase his Toprol xl to 25 mg twice a day.   Will benefit for zio monitor to understand PVC burden with I suspect is frequent given his tele monitor. Reviewed device interrogation - no VT, VF.  No volume overload, know depressed EF may benefit from outpatient echo to assess for any valvular abnormalities - ie MR.   From a CV standpoint he can be discharged to home.    Thomasene Ripple DO, MS [facility] Attending Cardiologist [facility]  [address] [place], [place] [address] [phone] Website: [email or web address]

## 2022-08-26 NOTE — ED Triage Notes (Signed)
PT BIB [facility] for palpitations and SOB for 2 weeks, [place] fire [place], pacemaker not firing, went to [facility] but states they didn't know what to do with his results.    160/90 HR 70-90 O2 98% CBG 124 18 g Left forearm

## 2022-08-26 NOTE — ED Provider Notes (Signed)
[place] EMERGENCY DEPARTMENT AT [facility] Provider Note   CSN: [id number] Arrival date & time: 08/26/22  4098     History  Chief Complaint  Patient presents with   Palpitations    Daniel [name]. is a 77 y.o. male.  HPI    77 y.o. male who has hx of CAD s/p CABG 05/12/2016, HL, HTN, CHF, AAA with ED of 25% and AICD placement comes to the ER with chief complaint of palpitations, shortness of breath and dizziness.  Patient states that over the last 2 weeks he has been having episodes of palpitations.  Symptoms are intermittent, unprovoked and will last for few minutes to several hours.  Often he has noticed that with exertion he does have palpitations.  Associated with the palpitations he has shortness of breath, dizziness and mild chest discomfort.  Chest pain is nonradiating.  He denies any near-syncope or syncope.  He denies any shocks from his AICD.  Patient has been taking his medications as prescribed.  He denies any new orthopnea, PND like symptoms.  He was seen in the emergency room few days back.  Indicates that he has not felt any better, and that the workup in the ER was reassuring.  Home Medications Prior to Admission medications   Medication Sig Start Date End Date Taking? Authorizing Provider  metoprolol succinate (TOPROL-XL) 25 MG 24 hr tablet Take 1 tablet (25 mg total) by mouth in the morning and at bedtime. 08/26/22  Yes Charlynne Pander, MD  ALPRAZolam Prudy Feeler) 0.25 MG tablet tAKE 1-2 tabLETS BY MOUTH AT BEDTIME AS NEEDED FOR insomnia/restless legs 08/16/22   McGowen, Maryjean Morn, MD  aspirin EC 81 MG tablet Take 1 tablet (81 mg total) by mouth daily. Swallow whole. 02/05/20   Rollene Rotunda, MD  benazepril (LOTENSIN) 20 MG tablet Take 1 tablet (20 mg total) by mouth daily. 1 tab po bid 11/17/21   McGowen, Maryjean Morn, MD  dapagliflozin propanediol (FARXIGA) 10 MG TABS tablet TAKE ONE TABLET BY MOUTH DAILY BEFORE BREAKFAST 07/14/22   Swinyer, Zachary George, NP   furosemide (LASIX) 20 MG tablet 1 tab po bid Patient taking differently: Take 20 mg by mouth 2 (two) times daily. 1 tab po bid 02/18/22   McGowen, Maryjean Morn, MD  Omega-3 Fatty Acids (FISH OIL) 1000 MG CAPS Take 1,000 mg by mouth daily.     [provider]  ondansetron (ZOFRAN) 4 MG tablet Take 1 tablet (4 mg total) by mouth every 8 (eight) hours as needed for nausea or vomiting. 10/06/20   McGowen, Maryjean Morn, MD  Polyethyl Glycol-Propyl Glycol (LUBRICANT EYE DROPS) 0.4-0.3 % SOLN Place 1 drop into both eyes 3 (three) times daily as needed (dry/irritated eyes.).    [provider]  predniSONE (DELTASONE) 50 MG tablet Take 1 tab 13 hrs before, 7 hrs before and 1 hr before CT Patient not taking: Reported on 08/16/2022 03/30/22   Rollene Rotunda, MD  Vitamin D, Cholecalciferol, 400 units CAPS Take 400 Units by mouth daily.     [provider]  Cetirizine HCl (ZYRTEC ALLERGY) 10 MG CAPS Take 1 capsule (10 mg total) by mouth daily. 11/06/14 11/06/14  Rolan Bucco, MD      Allergies    Entresto [sacubitril-valsartan], Amlodipine, Contrast media [iodinated contrast media], and Hydralazine    Review of Systems   Review of Systems  All other systems reviewed and are negative.   Physical Exam Updated Vital Signs BP 120/83   Pulse [name])  26   Temp 98.4 F (36.9 C) (Oral)   Resp 13   SpO2 96%  Physical Exam Vitals and nursing note reviewed.  Constitutional:      Appearance: He is well-developed.  HENT:     Head: Atraumatic.  Cardiovascular:     Rate and Rhythm: Tachycardia present.  Pulmonary:     Effort: Pulmonary effort is normal.  Abdominal:     Palpations: Abdomen is soft.     Tenderness: There is no abdominal tenderness.  Musculoskeletal:     Cervical back: Neck supple.     Right lower leg: No edema.     Left lower leg: No edema.  Skin:    General: Skin is warm.  Neurological:     Mental Status: He is alert and oriented to person, place, and time.     ED  Results / Procedures / Treatments   Labs (all labs ordered are listed, but only abnormal results are displayed) Labs Reviewed  CBC WITH DIFFERENTIAL/PLATELET - Abnormal; Notable for the following components:      Result Value   Platelets 131 (*)    All other components within normal limits  BRAIN NATRIURETIC PEPTIDE - Abnormal; Notable for the following components:   B Natriuretic Peptide 1,703.6 (*)    All other components within normal limits  BASIC METABOLIC PANEL - Abnormal; Notable for the following components:   BUN 26 (*)    All other components within normal limits  TROPONIN I (HIGH SENSITIVITY) - Abnormal; Notable for the following components:   Troponin I (High Sensitivity) 21 (*)    All other components within normal limits  TROPONIN I (HIGH SENSITIVITY) - Abnormal; Notable for the following components:   Troponin I (High Sensitivity) 28 (*)    All other components within normal limits  MAGNESIUM    EKG EKG Interpretation Date/Time:  Friday August 26 2022 07:38:13 EDT Ventricular Rate:  76 PR Interval:  208 QRS Duration:  166 QT Interval:  432 QTC Calculation: 477 R Axis:   -71  Text Interpretation: ATRIAL PACED RHYTHM Multiple ventricular premature complexes Nonspecific IVCD with LAD LVH with secondary repolarization abnormality discordant ST depression in inferior lead and elevation in anterior lead is new Confirmed by Derwood Kaplan ([id number]) on 08/26/2022 12:34:56 PM  Radiology DG Chest Port 1 View  Result Date: 08/26/2022 CLINICAL DATA:  Shortness of breath, palpitations. EXAM: PORTABLE CHEST 1 VIEW COMPARISON:  08/21/2022. FINDINGS: Left chest AICD/pacemaker with leads projecting over the right atrium and ventricle. Clear lungs. Decreased cardiomegaly. Stable mediastinal contours with postoperative changes of median sternotomy and CABG. No pleural effusion or pneumothorax. Visualized bones and upper abdomen are unremarkable. IMPRESSION: No evidence of acute  cardiopulmonary disease. Decreased cardiomegaly. Electronically Signed   By: Orvan Falconer M.D.   On: 08/26/2022 09:32    Procedures Procedures    Medications Ordered in ED Medications  furosemide (LASIX) injection 40 mg (40 mg Intravenous Given 08/26/22 1146)    ED Course/ Medical Decision Making/ A&P                             Medical Decision Making Amount and/or Complexity of Data Reviewed Labs: ordered. Radiology: ordered.  Risk Prescription drug management. Decision regarding hospitalization.   This patient presents to the ED with chief complaint(s) of palpitations and associated shortness of breath, dizziness and mild chest discomfort with pertinent past medical history of CAD, CHF with EF of 20 to  25% and AICD placement and pacemaker placement, AAA.The complaint involves an extensive differential diagnosis and also carries with it a high risk of complications and morbidity.    The differential diagnosis includes : Acute coronary syndrome, acute arrhythmia including A-fib, VT, SVT, worsening valvular disease, CHF with diastolic dysfunction.  The initial plan is to get basic labs to ensure there is no reversible cause such as electrolyte abnormality.  Will also get troponins, BNP.   Additional history obtained: Additional history obtained from family -son was at the bedside. Records reviewed previous admission documents.  Also reviewed patient's cardiology visit from late 2023 and his last echocardiogram.  Independent labs interpretation:  The following labs were independently interpreted: BNP is over 1500.  Rest of the labs are reassuring.  High sensitive troponin is slightly elevated, but appears to be his baseline elevation.  EKG does have some discordant ST elevation and depression that is new, but it is not diagnostic for MI.  Patient also does not have symptoms consistent with acute MI.   Independent visualization and interpretation of imaging: - I independently  visualized the following imaging with scope of interpretation limited to determining acute life threatening conditions related to emergency care: X-ray of the chest, which revealed no evidence of significant pulmonary edema or pleural effusion  Treatment and Reassessment: Patient is high sensitive troponin is at his baseline.  Cardiology has been consulted at this time.  Patient made aware.  He will receive IV Lasix given the elevation of BNP.  Consultation: - Consulted or discussed management/test interpretation with external professional: Cardiology service, they will assess the patient independently.    Final Clinical Impression(s) / ED Diagnoses Final diagnoses:  Palpitations    Rx / DC Orders ED Discharge Orders          Ordered    metoprolol succinate (TOPROL-XL) 25 MG 24 hr tablet  2 times daily        08/26/22 1547              Derwood Kaplan, MD 08/26/22 1552

## 2022-08-31 ENCOUNTER — Encounter: Payer: Self-pay | Admitting: Family Medicine

## 2022-08-31 ENCOUNTER — Telehealth: Payer: Self-pay

## 2022-08-31 ENCOUNTER — Telehealth: Payer: Self-pay | Admitting: Family Medicine

## 2022-08-31 ENCOUNTER — Ambulatory Visit (INDEPENDENT_AMBULATORY_CARE_PROVIDER_SITE_OTHER): Payer: Medicare Other | Admitting: Family Medicine

## 2022-08-31 VITALS — BP 140/70 | Temp 97.5°F | Wt 142.0 lb

## 2022-08-31 DIAGNOSIS — Z950 Presence of cardiac pacemaker: Secondary | ICD-10-CM

## 2022-08-31 DIAGNOSIS — J019 Acute sinusitis, unspecified: Secondary | ICD-10-CM | POA: Diagnosis not present

## 2022-08-31 DIAGNOSIS — Z1152 Encounter for screening for COVID-19: Secondary | ICD-10-CM | POA: Diagnosis not present

## 2022-08-31 DIAGNOSIS — R002 Palpitations: Secondary | ICD-10-CM | POA: Diagnosis not present

## 2022-08-31 DIAGNOSIS — R0981 Nasal congestion: Secondary | ICD-10-CM

## 2022-08-31 DIAGNOSIS — I493 Ventricular premature depolarization: Secondary | ICD-10-CM

## 2022-08-31 LAB — POC COVID19 BINAXNOW: SARS Coronavirus 2 Ag: NEGATIVE

## 2022-08-31 MED ORDER — AMOXICILLIN 875 MG PO TABS: 875.0000 mg | ORAL_TABLET | Freq: Two times a day (BID) | ORAL | 0 refills | Status: AC

## 2022-08-31 MED ORDER — FLUTICASONE PROPIONATE 50 MCG/ACT NA SUSP: 2.0000 | Freq: Every day | NASAL | 6 refills | Status: DC

## 2022-08-31 NOTE — Patient Instructions (Signed)
Do not use any over the counter nasal sprays

## 2022-08-31 NOTE — Telephone Encounter (Signed)
Transition Care Management Unsuccessful Follow-up Telephone Call  Date of discharge and from where:  08/26/2022 [facility]  Attempts:  1st Attempt  Reason for unsuccessful TCM follow-up call:  No answer/busy  [name]  [facility] Population Health Community Resource Care Guide   [email or web address]  ?? [phone]   Website: [email or web address]

## 2022-08-31 NOTE — Progress Notes (Signed)
OFFICE VISIT  08/31/2022  CC:  Chief Complaint  Patient presents with   Facial Pain    Pt states he has been experiencing sinus pain and pressure along with nasal congestion on the right side    Patient is a 77 y.o. male who presents for sinus pressure.  HPI: Has had 10 to 14 days of gradually progressive pressure in the right side of his forehead and face and ear.  Increasing thickness and quantity of mucus going down the back of his throat and blowing out the right side of his nose. No fever, no cough.  He is using an over-the-counter nasal spray but he does not know what is in it. Wife with URI recently.    Past Medical History:  Diagnosis Date   Abdominal aortic aneurysm (HCC) 01/2021   4 cm, infrarenal.  Also enlarged left common iliac artery--vascular eval 03/2021->rpt aortic u/s 06/01/22 stable Aorta 4.2 cm.  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 Myrtis Ser 2013- no further visits.  Post-op (CABG) a-fib, converted on amio but pt self d/c'd this med due to DOE/side effect profile.   CAD (coronary artery disease) 05/2016   a. 05/2016: NSTEMI with cath showing 3-vessel disease. Underwent CABG on 05/12/16 with LIMA-D1, SVG-LAD, and SVG-PDA.    COPD (chronic obstructive pulmonary disease) (HCC) fall 2016   Bullous changes noted on lower lung images of CT abd done by urology   Diverticulitis    Double vision 01/2020   MG ab w/u NEG.  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 Unable to have MRI due  to having shrapnel in forehead.   Hyperlipidemia    statin intolerant. Pt declined advanced lipid clinic referral 08/2019, 03/2020, and 01/2021.   Hypertension    Ischemic cardiomyopathy 05/2016   EF 20-25% at the time of NSTEMI/CABG.  Repeat EF 07/2016 30-35%.  07/2017 EF 25-30%, pt intol of entresto, bidil, and BB--for ICD 03/2019.   Microscopic hematuria Fall 2016   CT nl except nonobstructing stones.  Cystoscopy normal 12/30/14 (Dr. 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  Urol (Dr. Nechama Guard) at [facility]; Dr. Nechama Guard referred him to Dr. Laverle Patter 03/2015: patient got bilat nerve sparing , robot assisted laparoscopic radical prostatectomy and pelvic lymphadenectomy.  Urol f/u, PSA surveillance showing very mild uptrend of PSA but not to the level of biochemical recurrence as of 05/2021 urology follow-up   RLS (restless legs syndrome)    Tobacco dependence    down to 1-2 cigs per day as of 11/2015.  Restarted 08/2016.   Urinary retention 05/2015   occurred s/p foley removal    Past Surgical History:  Procedure Laterality Date   aortic ultrasound  07/2012   2014 NO AAA.  01/2021-->4 cm AAA with  R common iliac ectasia-->vasc surg ref   COLONOSCOPY  approx 2011 per pt   Done by surgeon in [place] after a bout of diverticulitis; normal per pt report--was told to repeat in 10 yrs.   CORONARY ARTERY BYPASS GRAFT N/A 05/12/2016   Procedure: CORONARY ARTERY BYPASS GRAFTING (CABG) TIMES THREE USING LEFT INTERNAL MAMMARY ARTERY AND RIGHT SAPHENOUS LEG VEIN HARVESTED ENDOSCOPICALLY;  Surgeon: Loreli Slot, MD;  Location: [facility] OR;  Service: Open Heart Surgery;  Laterality: N/A;   ICD IMPLANT N/A 04/12/2019   Procedure: ICD IMPLANT;  Surgeon: Marinus Maw, MD;  Location: [facility] INVASIVE CV  LAB;  Service: Cardiovascular;  Laterality: N/A;   INGUINAL HERNIA REPAIR  2003 and 2004   Both sides.   LAPAROSCOPY  2017   LEFT HEART CATH AND CORONARY ANGIOGRAPHY N/A 05/09/2016   Procedure: Left Heart Cath and Coronary Angiography;  Surgeon: Lennette Bihari, MD;  Location: [facility] INVASIVE CV LAB;  Service: Cardiovascular;  Laterality: N/A;   LITHOTRIPSY  2004   Dr. Rito Ehrlich in [place].   LYMPHADENECTOMY Bilateral 04/30/2015   Procedure: PELVIC LYMPHADENECTOMY;  Surgeon: Heloise Purpura, MD;  Location: [facility] ORS;  Service: Urology;  Laterality: Bilateral;   PROSTATE BIOPSY  02/05/2015   Prostate adenocarcinoma: pt considering treatment options as of 02/19/15   ROBOT ASSISTED LAPAROSCOPIC RADICAL PROSTATECTOMY N/A 04/30/2015   Procedure: XI ROBOTIC ASSISTED LAPAROSCOPIC RADICAL PROSTATECTOMY LEVEL 2;  Surgeon: Heloise Purpura, MD;  Location: [facility] ORS;  Service: Urology;  Laterality: N/A;   TEE WITHOUT CARDIOVERSION N/A 05/12/2016   Procedure: TRANSESOPHAGEAL ECHOCARDIOGRAM (TEE);  Surgeon: Loreli Slot, MD;  Location: [facility] OR;  Service: Open Heart Surgery;  Laterality: N/A;   TRANSTHORACIC ECHOCARDIOGRAM  03/22/2006; 05/2016; 07/2016; 07/2017; 01/02/20   2008: EF 65%, normal valves, no wall motion abnormalties, LA size normal.  05/2016 in context of non-STEMI, EF 20-25%, mild AV and MV regurg.  08/05/16: EF 30-35%, akinesis of mid lateral myoc, grd II DD, mild aortic 07/2017: EF 25-30%, mult areas of akinesis, grd I DD. 01/2020 EF 25-30%, sev LV dsfxn, valves fine, aortic root 40mm    Outpatient Medications Prior to Visit  Medication Sig Dispense Refill   ALPRAZolam (XANAX) 0.25 MG tablet tAKE 1-2 tabLETS BY MOUTH AT BEDTIME AS NEEDED FOR insomnia/restless legs 60 tablet 1   aspirin EC 81 MG tablet Take 1 tablet (81 mg total) by mouth daily. Swallow whole. 90 tablet 3   benazepril (LOTENSIN) 20 MG tablet Take 1 tablet (20 mg total) by mouth daily. 1 tab po bid 180 tablet 3   dapagliflozin propanediol (FARXIGA)  10 MG TABS tablet TAKE ONE TABLET BY MOUTH DAILY BEFORE BREAKFAST 30 tablet 11   furosemide (LASIX) 20 MG tablet 1 tab po bid (Patient taking differently: Take 20 mg by mouth 2 (two) times daily. 1 tab po bid) 180 tablet 3   Omega-3 Fatty Acids (FISH OIL) 1000 MG CAPS Take 1,000 mg by mouth daily.      ondansetron (ZOFRAN) 4 MG tablet Take 1 tablet (4 mg total) by mouth every 8 (eight) hours as needed for nausea or vomiting. 20 tablet 1   Polyethyl Glycol-Propyl Glycol (LUBRICANT EYE DROPS) 0.4-0.3 % SOLN Place 1 drop into both eyes 3 (three) times daily as needed (dry/irritated eyes.).     Vitamin D, Cholecalciferol, 400 units CAPS Take 400 Units by mouth daily.      metoprolol succinate (TOPROL-XL) 25 MG 24 hr tablet Take 1 tablet (25 mg total) by mouth in  the morning and at bedtime. 60 tablet 0   predniSONE (DELTASONE) 50 MG tablet Take 1 tab 13 hrs before, 7 hrs before and 1 hr before CT (Patient not taking: Reported on 08/16/2022) 3 tablet 0   No facility-administered medications prior to visit.    Allergies  Allergen Reactions   Entresto [Sacubitril-Valsartan] Hives and Shortness Of Breath   Amlodipine Swelling    LE swelling   Contrast Media [Iodinated Contrast Media] Other (See Comments)    Prostate problem had to wear a catheter for two weeks after having dye.    Hydralazine Palpitations and Other (See Comments)    Fatigue+    Review of Systems  As per HPI  PE:    08/31/2022   11:24 AM 08/26/2022    3:30 PM 08/26/2022    2:48 PM  Vitals with BMI  Weight 142 lbs    BMI 19.81    Systolic 140 141 956  Diastolic 70 79 83  Pulse  51 26     Physical Exam  VS: noted--normal. Gen: alert, NAD, NONTOXIC APPEARING. HEENT: eyes without injection, drainage, or swelling.  Ears: EACs clear, TMs with normal light reflex and landmarks.  Nose: Clear rhinorrhea, with some dried, crusty exudate adherent to mildly injected mucosa.  No purulent d/c.  No paranasal sinus TTP.  No facial  swelling.  Throat and mouth without focal lesion.  No pharyngial swelling, erythema, or exudate.  Positive for postnasal drip, thick and white. Neck: supple, no LAD.   LUNGS: CTA bilat, nonlabored resps.   CV: Irreg irreg, rate about 70 no m/r/g. EXT: no c/c/e SKIN: no rash   LABS:  Last CBC Lab Results  Component Value Date   WBC 6.2 08/26/2022   HGB 15.7 08/26/2022   HCT 48.3 08/26/2022   MCV 99.4 08/26/2022   MCH 32.3 08/26/2022   RDW 14.2 08/26/2022   PLT 131 (L) 08/26/2022   Last metabolic panel Lab Results  Component Value Date   GLUCOSE 92 08/26/2022   NA 140 08/26/2022   K 4.1 08/26/2022   CL 103 08/26/2022   CO2 23 08/26/2022   BUN 26 (H) 08/26/2022   CREATININE 0.97 08/26/2022   GFRNONAA >60 08/26/2022   CALCIUM 9.4 08/26/2022   PROT 6.8 12/12/2021   ALBUMIN 4.1 12/12/2021   LABGLOB 2.2 09/16/2016   AGRATIO 2.0 09/16/2016   BILITOT 1.2 12/12/2021   ALKPHOS 63 12/12/2021   AST 29 12/12/2021   ALT 24 12/12/2021   ANIONGAP 14 08/26/2022   IMPRESSION AND PLAN:  Acute sinusitis. Amoxicillin 875 twice daily x 10 days. Flonase prescribed. I recommend he avoid any over-the-counter nasal spray. Covid NEG in office today.  Of note, he went to the ED 08/21/2022 for palpitations.  He was found to have a paced rhythm and frequent PVCs.  Pacemaker interrogation was unsuccessful.  After consultation with Dr. Ladona Ridgel in cardiology he was put on Toprol-XL 25 mg. He presented to [facility] ED on 08/26/2022 for palpitations.  EKG showed atrial paced rhythm with multiple PVCs.  His BNP was significantly elevated.  He was treated with IV Lasix in the ED. Of note, patient has discontinued his Toprol because he said this caused his heart rate to drop into the 40s (even though pacer is set at 70?), and bp dropped into 90s systolic. He has felt improved last couple of days since stopping that medication.   An After Visit Summary was printed and given to the patient.  FOLLOW  UP: Return if symptoms worsen or fail to improve.  Signed:  Santiago Bumpers, MD           08/31/2022

## 2022-08-31 NOTE — Telephone Encounter (Signed)
Patient's wife has an appointment today with Dr. Claiborne Billings. Offered patient 11:20 appt with Dr. Milinda Cave, and he declined. He is requesting medication for sinus.  Please give him a call to discuss options.

## 2022-09-01 ENCOUNTER — Telehealth: Payer: Self-pay

## 2022-09-01 NOTE — Telephone Encounter (Signed)
Transition Care Management Unsuccessful Follow-up Telephone Call  Date of discharge and from where:  08/26/2022 [facility]  Attempts:  2nd Attempt  Reason for unsuccessful TCM follow-up call:  Left voice message  Daniel [name] [facility]  [facility] Population Health Community Resource Care Guide   [email or web address]  ?? [phone]   Website: [email or web address]

## 2022-09-29 ENCOUNTER — Ambulatory Visit: Payer: Medicare Other | Attending: Internal Medicine | Admitting: Internal Medicine

## 2022-09-29 ENCOUNTER — Encounter: Payer: Self-pay | Admitting: Internal Medicine

## 2022-09-29 VITALS — BP 132/78 | HR 63 | Ht 71.0 in | Wt 141.8 lb

## 2022-09-29 DIAGNOSIS — I255 Ischemic cardiomyopathy: Secondary | ICD-10-CM

## 2022-09-29 LAB — CUP PACEART INCLINIC DEVICE CHECK
Battery Remaining Longevity: 54 mo
Brady Statistic RA Percent Paced: 89 %
Brady Statistic RV Percent Paced: 63 %
Date Time Interrogation Session: 20240829113643
HighPow Impedance: 65.25 Ohm
Implantable Lead Connection Status: 753985
Implantable Lead Connection Status: 753985
Implantable Lead Implant Date: 20210312
Implantable Lead Implant Date: 20210312
Implantable Lead Location: 753859
Implantable Lead Location: 753860
Implantable Pulse Generator Implant Date: 20210312
Lead Channel Impedance Value: 412.5 Ohm
Lead Channel Impedance Value: 437.5 Ohm
Lead Channel Pacing Threshold Amplitude: 0.375 V
Lead Channel Pacing Threshold Amplitude: 1 V
Lead Channel Pacing Threshold Amplitude: 1 V
Lead Channel Pacing Threshold Pulse Width: 0.5 ms
Lead Channel Pacing Threshold Pulse Width: 0.5 ms
Lead Channel Pacing Threshold Pulse Width: 0.5 ms
Lead Channel Sensing Intrinsic Amplitude: 12 mV
Lead Channel Sensing Intrinsic Amplitude: 2.3 mV
Lead Channel Setting Pacing Amplitude: 1.125
Lead Channel Setting Pacing Amplitude: 1.375
Lead Channel Setting Pacing Pulse Width: 0.5 ms
Lead Channel Setting Sensing Sensitivity: 0.5 mV
Pulse Gen Serial Number: 111016652
Zone Setting Status: 755011

## 2022-09-29 NOTE — Progress Notes (Signed)
HPI Daniel [name] returns today for followup. He is a pleasant 77 yo man with a h/o CAD, s/p MI, Chronic systolic heart failure, EF 20%, who underwent ICD insertion 3 years ago. He had a lot of PVC"s after his procedure which could be supressed by pacing at 75/min. He feels ok at rest but notes that he gets tired quickly when he walks. No ICD therapies and no palpitations. He had been on amiodarone previously but is not now. He had been taken off coreg.  Allergies  Allergen Reactions   Entresto [Sacubitril-Valsartan] Hives and Shortness Of Breath   Amlodipine Swelling    LE swelling   Contrast Media [Iodinated Contrast Media] Other (See Comments)    Prostate problem had to wear a catheter for two weeks after having dye.    Hydralazine Palpitations and Other (See Comments)    Fatigue+     Current Outpatient Medications  Medication Sig Dispense Refill   ALPRAZolam (XANAX) 0.25 MG tablet tAKE 1-2 tabLETS BY MOUTH AT BEDTIME AS NEEDED FOR insomnia/restless legs 60 tablet 1   aspirin EC 81 MG tablet Take 1 tablet (81 mg total) by mouth daily. Swallow whole. 90 tablet 3   benazepril (LOTENSIN) 20 MG tablet Take 1 tablet (20 mg total) by mouth daily. 1 tab po bid 180 tablet 3   dapagliflozin propanediol (FARXIGA) 10 MG TABS tablet TAKE ONE TABLET BY MOUTH DAILY BEFORE BREAKFAST 30 tablet 11   fluticasone (FLONASE) 50 MCG/ACT nasal spray Place 2 sprays into both nostrils daily. 16 g 6   furosemide (LASIX) 20 MG tablet 1 tab po bid (Patient taking differently: Take 20 mg by mouth 2 (two) times daily. 1 tab po bid) 180 tablet 3   Omega-3 Fatty Acids (FISH OIL) 1000 MG CAPS Take 1,000 mg by mouth daily.      ondansetron (ZOFRAN) 4 MG tablet Take 1 tablet (4 mg total) by mouth every 8 (eight) hours as needed for nausea or vomiting. 20 tablet 1   Polyethyl Glycol-Propyl Glycol (LUBRICANT EYE DROPS) 0.4-0.3 % SOLN Place 1 drop into both eyes 3 (three) times daily as needed (dry/irritated eyes.).      Vitamin D, Cholecalciferol, 400 units CAPS Take 400 Units by mouth daily.      No current facility-administered medications for this visit.     Past Medical History:  Diagnosis Date   Abdominal aortic aneurysm (HCC) 01/2021   4 cm, infrarenal.  Also enlarged left common iliac artery--vascular eval 03/2021->rpt aortic u/s 06/01/22 stable Aorta 4.2 cm.  Bilat common iliac aneurisms and left common femoral aneurism-->f/u 1 yr   Anxiety    Atrial fibrillation [facility])    Limited episode, emergency room, June, 2013, spontaneous conversion to sinus rhythm, was evaluated By Dr. Myrtis Ser 2013- no further visits.  Post-op (CABG) a-fib, converted on amio but pt self d/c'd this med due to DOE/side effect profile.   CAD (coronary artery disease) 05/2016   a. 05/2016: NSTEMI with cath showing 3-vessel disease. Underwent CABG on 05/12/16 with LIMA-D1, SVG-LAD, and SVG-PDA.    COPD (chronic obstructive pulmonary disease) (HCC) fall 2016   Bullous changes noted on lower lung images of CT abd done by urology   Diverticulitis    Double vision 01/2020   MG ab w/u NEG.  +4th nerve palsy/mid brain CVA, small vessels dz->DAPT   GERD (gastroesophageal reflux disease)    Has received pneumococcal vaccination    History of CVA (cerebrovascular accident) without residual deficits  01/2020   L midbrain, with mild L 4th CN palsy-->small vessel dz->DAPT x 3 wks then ASA alone (pt's compliance with ASA PRIOR to CVA was questionable). Unable to have MRI due to having shrapnel in forehead.   Hyperlipidemia    statin intolerant. Pt declined advanced lipid clinic referral 08/2019, 03/2020, and 01/2021.   Hypertension    Ischemic cardiomyopathy 05/2016   EF 20-25% at the time of NSTEMI/CABG.  Repeat EF 07/2016 30-35%.  07/2017 EF 25-30%, pt intol of entresto, bidil, and BB--for ICD 03/2019.   Microscopic hematuria Fall 2016   CT nl except nonobstructing stones.  Cystoscopy normal 12/30/14 (Dr. 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  Urol (Dr. Nechama Guard) at [facility]; Dr. Nechama Guard referred him to Dr. Laverle Patter 03/2015: patient got bilat nerve sparing , robot assisted laparoscopic radical prostatectomy and pelvic lymphadenectomy.  Urol f/u, PSA surveillance showing very mild uptrend of PSA but not to the level of biochemical recurrence as of 05/2021 urology follow-up   RLS (restless legs syndrome)    Tobacco dependence    down to 1-2 cigs per day as of 11/2015.  Restarted 08/2016.   Urinary retention 05/2015   occurred s/p foley removal    ROS:   All systems reviewed and negative except as noted in the HPI.   Past Surgical History:  Procedure Laterality Date   aortic ultrasound  07/2012   2014 NO AAA.  01/2021-->4 cm AAA with R common iliac ectasia-->vasc surg ref   COLONOSCOPY  approx 2011 per pt   Done by surgeon in [place] after a bout of diverticulitis; normal per pt report--was told to repeat in 10 yrs.   CORONARY ARTERY BYPASS GRAFT N/A 05/12/2016   Procedure: CORONARY ARTERY BYPASS GRAFTING (CABG) TIMES THREE USING LEFT INTERNAL MAMMARY ARTERY AND RIGHT SAPHENOUS LEG VEIN HARVESTED ENDOSCOPICALLY;  Surgeon: Loreli Slot, MD;  [facility];  Service: Open Heart Surgery;  Laterality: N/A;   ICD IMPLANT N/A 04/12/2019   Procedure: ICD IMPLANT;  Surgeon: Marinus Maw, MD;  Location: [facility] INVASIVE CV LAB;  Service: Cardiovascular;  Laterality: N/A;   INGUINAL HERNIA REPAIR  2003 and 2004   Both sides.   LAPAROSCOPY  2017   LEFT HEART CATH AND CORONARY ANGIOGRAPHY N/A 05/09/2016   Procedure: Left Heart Cath and Coronary Angiography;  Surgeon: Lennette Bihari, MD;  Location: [facility] INVASIVE CV LAB;  Service: Cardiovascular;  Laterality: N/A;   LITHOTRIPSY  2004   Dr. Rito Ehrlich in [place].   LYMPHADENECTOMY  Bilateral 04/30/2015   Procedure: PELVIC LYMPHADENECTOMY;  Surgeon: Heloise Purpura, MD;  Location: [facility] ORS;  Service: Urology;  Laterality: Bilateral;   PROSTATE BIOPSY  02/05/2015   Prostate adenocarcinoma: pt considering treatment options as of 02/19/15   ROBOT ASSISTED LAPAROSCOPIC RADICAL PROSTATECTOMY N/A 04/30/2015   Procedure: XI ROBOTIC ASSISTED LAPAROSCOPIC RADICAL PROSTATECTOMY LEVEL 2;  Surgeon: Heloise Purpura, MD;  Location: [facility] ORS;  Service: Urology;  Laterality: N/A;   TEE WITHOUT CARDIOVERSION N/A 05/12/2016   Procedure: TRANSESOPHAGEAL ECHOCARDIOGRAM (TEE);  Surgeon: Loreli Slot, MD;  Location: [facility] OR;  Service: Open Heart Surgery;  Laterality: N/A;   TRANSTHORACIC ECHOCARDIOGRAM  03/22/2006; 05/2016; 07/2016; 07/2017; 01/02/20   2008: EF 65%, normal valves, no  wall motion abnormalties, LA size normal.  05/2016 in context of non-STEMI, EF 20-25%, mild AV and MV regurg.  08/05/16: EF 30-35%, akinesis of mid lateral myoc, grd II DD, mild aortic 07/2017: EF 25-30%, mult areas of akinesis, grd I DD. 01/2020 EF 25-30%, sev LV dsfxn, valves fine, aortic root 40mm     Family History  Problem Relation Age of Onset   Hypertension Mother    Cancer - Other Mother 73       breast   Cancer Father        Lung ca age 74   Diabetes Sister      Social History   Socioeconomic History   Marital status: Married    Spouse name: Not on file   Number of children: Not on file   Years of education: Not on file   Highest education level: Not on file  Occupational History   Not on file  Tobacco Use   Smoking status: Some Days    Current packs/day: 1.00    Average packs/day: 1 pack/day for 55.0 years (55.0 ttl pk-yrs)    Types: Cigarettes   Smokeless tobacco: Never   Tobacco comments:    quit in 2017  Vaping Use   Vaping status: Never Used  Substance and Sexual Activity   Alcohol use: No   Drug use: No   Sexual activity: Not on file  Other Topics Concern   Not on file  Social History  Narrative   Married, 2 grown children.   HS education.  Orig from [place], lives there now.   Occupation: maintenance.  Drives a tractor a lot, drives a pickup truck a lot, rides horses a lot.   Tobacco: 20 pack-yr hx (current as of 07/2012)   No drugs.   Alcohol: none   Social Determinants of Health   Financial Resource Strain: Low Risk  (09/29/2021)   Overall Financial Resource Strain (CARDIA)    Difficulty of Paying Living Expenses: Not hard at all  Food Insecurity: No Food Insecurity (09/29/2021)   Hunger Vital Sign    Worried About Running Out of Food in the Last Year: Never true    Ran Out of Food in the Last Year: Never true  Transportation Needs: No Transportation Needs (09/29/2021)   PRAPARE - Administrator, Civil Service (Medical): No    Lack of Transportation (Non-Medical): No  Physical Activity: Inactive (09/29/2021)   Exercise Vital Sign    Days of Exercise per Week: 0 days    Minutes of Exercise per Session: 0 min  Stress: No Stress Concern Present (09/29/2021)   [employer] of Occupational Health - Occupational Stress Questionnaire    Feeling of Stress : Not at all  Social Connections: Moderately Integrated (09/29/2021)   Social Connection and Isolation Panel [NHANES]    Frequency of Communication with Friends and Family: More than three times a week    Frequency of Social Gatherings with Friends and Family: More than three times a week    Attends Religious Services: More than 4 times per year    Active Member of [employer] or Organizations: No    Attends Banker Meetings: Never    Marital Status: Married  Catering manager Violence: Not At Risk (09/29/2021)   Humiliation, Afraid, Rape, and Kick questionnaire    Fear of Current or Ex-Partner: No    Emotionally Abused: No    Physically Abused: No    Sexually Abused: No     BP  132/78   Pulse 63   Ht 5\' 11"  (1.803 m)   Wt 141 lb 12.8 oz (64.3 kg)   SpO2 97%   BMI 19.78 kg/m    Physical Exam:  Well appearing NAD HEENT: Unremarkable Neck:  No JVD, no thyromegally Lymphatics:  No adenopathy Back:  No CVA tenderness Lungs:  Clear HEART:  Regular rate rhythm, no murmurs, no rubs, no clicks Abd:  soft, positive bowel sounds, no organomegally, no rebound, no guarding Ext:  2 plus pulses, no edema, no cyanosis, no clubbing Skin:  No rashes no nodules Neuro:  CN II through XII intact, motor grossly intact  EKG  DEVICE  Normal device function.  See PaceArt for details.   Assess/Plan: Chronic systolic heart faiure - his symptoms are better. He will continue GDMT.  2. CAD - he denies anginal symptoms. He is s/p CABG. 3. PVC's - these are improved.  4. ICD - his St. Jude DDD ICD is working normally. 4.5 years of battery longevity.

## 2022-09-29 NOTE — Patient Instructions (Signed)

## 2022-10-07 ENCOUNTER — Ambulatory Visit (INDEPENDENT_AMBULATORY_CARE_PROVIDER_SITE_OTHER): Payer: Medicare Other

## 2022-10-07 DIAGNOSIS — I255 Ischemic cardiomyopathy: Secondary | ICD-10-CM

## 2022-10-07 DIAGNOSIS — H6693 Otitis media, unspecified, bilateral: Secondary | ICD-10-CM | POA: Diagnosis not present

## 2022-10-07 DIAGNOSIS — H9201 Otalgia, right ear: Secondary | ICD-10-CM | POA: Diagnosis not present

## 2022-10-07 LAB — CUP PACEART REMOTE DEVICE CHECK
Battery Remaining Longevity: 53 mo
Battery Remaining Percentage: 54 %
Battery Voltage: 2.95 V
Brady Statistic AP VP Percent: 66 %
Brady Statistic AP VS Percent: 25 %
Brady Statistic AS VP Percent: 2.3 %
Brady Statistic AS VS Percent: 1.6 %
Brady Statistic RA Percent Paced: 88 %
Brady Statistic RV Percent Paced: 69 %
Date Time Interrogation Session: 20240906020317
HighPow Impedance: 63 Ohm
Implantable Lead Connection Status: 753985
Implantable Lead Connection Status: 753985
Implantable Lead Implant Date: 20210312
Implantable Lead Implant Date: 20210312
Implantable Lead Location: 753859
Implantable Lead Location: 753860
Implantable Pulse Generator Implant Date: 20210312
Lead Channel Impedance Value: 400 Ohm
Lead Channel Impedance Value: 460 Ohm
Lead Channel Pacing Threshold Amplitude: 0.375 V
Lead Channel Pacing Threshold Amplitude: 0.875 V
Lead Channel Pacing Threshold Pulse Width: 0.5 ms
Lead Channel Pacing Threshold Pulse Width: 0.5 ms
Lead Channel Sensing Intrinsic Amplitude: 12 mV
Lead Channel Sensing Intrinsic Amplitude: 2 mV
Lead Channel Setting Pacing Amplitude: 1.125
Lead Channel Setting Pacing Amplitude: 1.375
Lead Channel Setting Pacing Pulse Width: 0.5 ms
Lead Channel Setting Sensing Sensitivity: 0.5 mV
Pulse Gen Serial Number: 111016652
Zone Setting Status: 755011

## 2022-10-11 NOTE — Progress Notes (Signed)
Remote ICD transmission.   

## 2022-10-31 ENCOUNTER — Telehealth: Payer: Self-pay | Admitting: Family Medicine

## 2022-10-31 NOTE — Telephone Encounter (Signed)
Yes, this is okay.

## 2022-10-31 NOTE — Telephone Encounter (Signed)
Tried calling patient, unable to LVM.  

## 2022-10-31 NOTE — Telephone Encounter (Signed)
Patient would like to know if it is ok to take benazepril (LOTENSIN) 20 MG tablet  and Benadryl together? Please advise patient, if patient is unavailable to talk he gave verbal ok to speak with his spouse.

## 2022-10-31 NOTE — Telephone Encounter (Signed)
Please Advise

## 2022-11-01 NOTE — Telephone Encounter (Signed)
Noted  

## 2022-11-01 NOTE — Telephone Encounter (Signed)
Spoke with patient this morning and provided Dr. Samul Dada advice. Patient understood and has no additional questions.

## 2022-11-16 ENCOUNTER — Encounter: Payer: Self-pay | Admitting: Family Medicine

## 2022-11-16 ENCOUNTER — Ambulatory Visit (INDEPENDENT_AMBULATORY_CARE_PROVIDER_SITE_OTHER): Payer: Medicare Other | Admitting: Family Medicine

## 2022-11-16 VITALS — BP 167/77 | HR 73 | Wt 142.8 lb

## 2022-11-16 DIAGNOSIS — G2581 Restless legs syndrome: Secondary | ICD-10-CM | POA: Diagnosis not present

## 2022-11-16 DIAGNOSIS — I10 Essential (primary) hypertension: Secondary | ICD-10-CM

## 2022-11-16 DIAGNOSIS — Z79899 Other long term (current) drug therapy: Secondary | ICD-10-CM | POA: Diagnosis not present

## 2022-11-16 DIAGNOSIS — I255 Ischemic cardiomyopathy: Secondary | ICD-10-CM

## 2022-11-16 DIAGNOSIS — C61 Malignant neoplasm of prostate: Secondary | ICD-10-CM

## 2022-11-16 DIAGNOSIS — J309 Allergic rhinitis, unspecified: Secondary | ICD-10-CM

## 2022-11-16 LAB — BASIC METABOLIC PANEL
BUN: 20 mg/dL (ref 6–23)
CO2: 26 meq/L (ref 19–32)
Calcium: 10.2 mg/dL (ref 8.4–10.5)
Chloride: 102 meq/L (ref 96–112)
Creatinine, Ser: 0.99 mg/dL (ref 0.40–1.50)
GFR: 73.46 mL/min (ref >60.00)
Glucose, Bld: 80 mg/dL (ref 70–99)
Potassium: 4.5 meq/L (ref 3.5–5.1)
Sodium: 139 meq/L (ref 135–145)

## 2022-11-16 LAB — PSA, MEDICARE: PSA: 0.27 ng/mL (ref 0.10–4.00)

## 2022-11-16 MED ORDER — BENAZEPRIL HCL 20 MG PO TABS: 20.0000 mg | ORAL_TABLET | Freq: Every day | ORAL | 3 refills | Status: DC

## 2022-11-16 NOTE — Progress Notes (Signed)
OFFICE VISIT  11/16/2022  CC:  Chief Complaint  Patient presents with   Medical Management of Chronic Issues    Pt mentions ongoing head-cold. Nasal congestion and drainage, phlegm. Pt states he has continued to try nasal sprays and allergy meds but is finding no relief.      Patient is a 77 y.o. male who presents for 57-month follow-up hypertension and restless legs syndrome. A/P as of last visit: "#1 hypertension, not ideal control but stable.  He feels weak and dizzy if his blood pressure goes below 135/75. Will continue with benazepril 20 mg twice daily. Electrolytes and creatinine today   #2 ischemic cardiomyopathy.  Asymptomatic. Appears euvolemic. Continue Farxiga 10 mg a day, benazepril 20 mg twice daily, And Lasix 20 mg twice a day. Has is status post ICD in 2021.   Electrolytes and creatinine today.   #3 restless legs syndrome, stable on alprazolam 0.25 mg, 1-2 nightly as needed.   #4 nephrolithiasis.  Sounds like he is currently passing a stone, it is perhaps now in his bladder. UA showing microscopic hematuria makes sense but do not know why glucosuria is showing up.  His urine is light yellow and he has not had Azo in 2 days. He has never had problems with hyperglycemia. Checking serum glucose today."  INTERIM HX: Daniel [name] feels well. Blood pressures at home are typically around normal or mildly elevated. He is active in home and around his property, not limited by shortness of breath or chest pain. He occasionally skips a dose of Lasix if he feels a little bit dry.  He does not use the alprazolam very much.  His restless legs only bother him every couple of weeks or so. Alprazolam consistently works very well for this.  He has had nasal congestion and some ear pressure last few weeks.  Symptoms almost not present at all in the mornings but as the day goes on feels very congested in the nose, has some postnasal drip.  He cannot blow anything out of his nose.  His ears  feel like there is some pressure.  No face pain or headaches.  No significant cough.  No fever. He has been taking Flonase and recently started some Zyrtec and some saline nasal spray. He has gone through a couple rounds of antibiotics as well.  PMP AWARE reviewed today: most recent rx for alprazolam 0.25 mg was filled 08/18/2022, # 60, rx by me. No red flags.  Past Medical History:  Diagnosis Date   Abdominal aortic aneurysm (HCC) 01/2021   4 cm, infrarenal.  Also enlarged left common iliac artery--vascular eval 03/2021->rpt aortic u/s 06/01/22 stable Aorta 4.2 cm.  Bilat common iliac aneurisms and left common femoral aneurism-->f/u 1 yr   Anxiety    Atrial fibrillation [facility])    Limited episode, emergency room, June, 2013, spontaneous conversion to sinus rhythm, was evaluated By Dr. Myrtis Ser 2013- no further visits.  Post-op (CABG) a-fib, converted on amio but pt self d/c'd this med due to DOE/side effect profile.   CAD (coronary artery disease) 05/2016   a. 05/2016: NSTEMI with cath showing 3-vessel disease. Underwent CABG on 05/12/16 with LIMA-D1, SVG-LAD, and SVG-PDA.    COPD (chronic obstructive pulmonary disease) (HCC) fall 2016   Bullous changes noted on lower lung images of CT abd done by urology   Diverticulitis    Double vision 01/2020   MG ab w/u NEG.  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 Unable to have MRI due to having shrapnel in forehead.   Hyperlipidemia    statin intolerant. Pt declined advanced lipid clinic referral 08/2019, 03/2020, and 01/2021.   Hypertension    Ischemic cardiomyopathy 05/2016   EF 20-25% at the time of NSTEMI/CABG.  Repeat EF 07/2016 30-35%.   07/2017 EF 25-30%, pt intol of entresto, bidil, and BB--for ICD 03/2019.   Microscopic hematuria Fall 2016   CT nl except nonobstructing stones.  Cystoscopy normal 12/30/14 (Dr. 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  Urol (Dr. Nechama Guard) at [facility]; Dr. Nechama Guard referred him to Dr. Laverle Patter 03/2015: patient got bilat nerve sparing , robot assisted laparoscopic radical prostatectomy and pelvic lymphadenectomy.  Urol f/u, PSA surveillance showing very mild uptrend of PSA but not to the level of biochemical recurrence as of 05/2021 urology follow-up   RLS (restless legs syndrome)    Tobacco dependence    down to 1-2 cigs per day as of 11/2015.  Restarted 08/2016.   Urinary retention 05/2015   occurred s/p foley removal    Past Surgical History:  Procedure Laterality Date   aortic ultrasound  07/2012   2014 NO AAA.  01/2021-->4 cm AAA with R common iliac ectasia-->vasc surg ref   COLONOSCOPY  approx 2011 per pt   Done by surgeon in [place] after a bout of diverticulitis; normal per pt report--was told to repeat in 10 yrs.   CORONARY ARTERY BYPASS GRAFT N/A 05/12/2016   Procedure: CORONARY ARTERY BYPASS GRAFTING (CABG) TIMES THREE USING LEFT INTERNAL MAMMARY ARTERY AND RIGHT SAPHENOUS LEG VEIN HARVESTED ENDOSCOPICALLY;  Surgeon: Loreli Slot, MD;  Location: [facility] OR;  Service: Open Heart Surgery;  Laterality: N/A;   ICD IMPLANT N/A 04/12/2019   Procedure: ICD IMPLANT;  Surgeon: Marinus Maw, MD;  Location: [facility] INVASIVE CV LAB;  Service: Cardiovascular;  Laterality: N/A;   INGUINAL HERNIA REPAIR  2003 and 2004   Both sides.   LAPAROSCOPY  2017   LEFT HEART CATH AND CORONARY ANGIOGRAPHY N/A 05/09/2016   Procedure: Left Heart Cath and Coronary Angiography;  Surgeon: Lennette Bihari, MD;  Location: [facility] INVASIVE CV LAB;  Service: Cardiovascular;  Laterality: N/A;   LITHOTRIPSY  2004   Dr. Rito Ehrlich in [place].   LYMPHADENECTOMY Bilateral 04/30/2015   Procedure: PELVIC LYMPHADENECTOMY;  Surgeon: Heloise Purpura, MD;  Location: [facility] ORS;  Service: Urology;  Laterality: Bilateral;   PROSTATE BIOPSY  02/05/2015   Prostate adenocarcinoma: pt considering treatment options as of 02/19/15   ROBOT ASSISTED LAPAROSCOPIC RADICAL PROSTATECTOMY N/A 04/30/2015   Procedure: XI ROBOTIC ASSISTED LAPAROSCOPIC RADICAL PROSTATECTOMY LEVEL 2;  Surgeon: Heloise Purpura, MD;  Location: [facility] ORS;  Service: Urology;  Laterality: N/A;   TEE WITHOUT CARDIOVERSION N/A 05/12/2016   Procedure: TRANSESOPHAGEAL ECHOCARDIOGRAM (TEE);  Surgeon: Loreli Slot, MD;  Location: [facility] OR;  Service: Open Heart Surgery;  Laterality: N/A;   TRANSTHORACIC ECHOCARDIOGRAM  03/22/2006; 05/2016; 07/2016; 07/2017; 01/02/20   2008: EF 65%, normal valves, no wall  motion abnormalties, LA size normal.  05/2016 in context of non-STEMI, EF 20-25%, mild AV and MV regurg.  08/05/16: EF 30-35%, akinesis of mid lateral myoc, grd II DD, mild aortic 07/2017: EF 25-30%, mult areas of akinesis, grd I DD. 01/2020 EF 25-30%, sev LV dsfxn, valves fine, aortic root 40mm    Outpatient Medications Prior to Visit  Medication Sig Dispense Refill   ALPRAZolam (XANAX) 0.25 MG tablet tAKE 1-2 tabLETS BY MOUTH AT BEDTIME AS NEEDED FOR insomnia/restless legs 60 tablet 1   aspirin EC 81 MG tablet Take 1 tablet (81 mg total) by mouth daily. Swallow whole. 90 tablet 3   dapagliflozin propanediol (FARXIGA) 10 MG TABS tablet TAKE ONE TABLET BY MOUTH DAILY BEFORE BREAKFAST 30 tablet 11   fluticasone (FLONASE) 50 MCG/ACT nasal spray Place 2 sprays into both nostrils daily. 16 g 6   furosemide (LASIX) 20 MG tablet 1 tab po bid (Patient taking differently: Take 20 mg by mouth 2 (two) times daily. 1 tab po bid) 180 tablet 3   Omega-3 Fatty Acids (FISH OIL) 1000 MG CAPS  Take 1,000 mg by mouth daily.      ondansetron (ZOFRAN) 4 MG tablet Take 1 tablet (4 mg total) by mouth every 8 (eight) hours as needed for nausea or vomiting. 20 tablet 1   Polyethyl Glycol-Propyl Glycol (LUBRICANT EYE DROPS) 0.4-0.3 % SOLN Place 1 drop into both eyes 3 (three) times daily as needed (dry/irritated eyes.).     Vitamin D, Cholecalciferol, 400 units CAPS Take 400 Units by mouth daily.      benazepril (LOTENSIN) 20 MG tablet Take 1 tablet (20 mg total) by mouth daily. 1 tab po bid 180 tablet 3   No facility-administered medications prior to visit.    Allergies  Allergen Reactions   Entresto [Sacubitril-Valsartan] Hives and Shortness Of Breath   Amlodipine Swelling    LE swelling   Contrast Media [Iodinated Contrast Media] Other (See Comments)    Prostate problem had to wear a catheter for two weeks after having dye.    Hydralazine Palpitations and Other (See Comments)    Fatigue+    Review of Systems As per HPI  PE:    11/16/2022    9:51 AM 09/29/2022   10:59 AM 08/31/2022   11:24 AM  Vitals with BMI  Height  5\' 11"    Weight 142 lbs 13 oz 141 lbs 13 oz 142 lbs  BMI  19.79 19.81  Systolic 167 132 742  Diastolic 77 78 70  Pulse 73 63      Physical Exam  Gen: Alert, well appearing.  Patient is oriented to person, place, time, and situation. ENT: Ears: EACs clear, normal epithelium.  TMs with good light reflex and landmarks bilaterally.  Eyes: no injection, icteris, swelling, or exudate.  EOMI, PERRLA. Nose: no drainage or turbinate edema/swelling.  No injection or focal lesion.  Mouth: lips without lesion/swelling.  Oral mucosa pink and moist.  Dentition intact and without obvious caries or gingival swelling.  Oropharynx without erythema, exudate, or swelling.  CV: RRR, no m/r/g.   LUNGS: CTA bilat, nonlabored resps, good aeration in all lung fields. EXT: no clubbing or cyanosis.  no edema.    LABS:  Last CBC Lab Results  Component Value Date   WBC 6.2  08/26/2022   HGB 15.7 08/26/2022   HCT 48.3 08/26/2022   MCV 99.4 08/26/2022   MCH 32.3 08/26/2022   RDW 14.2 08/26/2022   PLT 131 (L) 08/26/2022  Last metabolic panel Lab Results  Component Value Date   GLUCOSE 92 08/26/2022   NA 140 08/26/2022   K 4.1 08/26/2022   CL 103 08/26/2022   CO2 23 08/26/2022   BUN 26 (H) 08/26/2022   CREATININE 0.97 08/26/2022   GFRNONAA >60 08/26/2022   CALCIUM 9.4 08/26/2022   PROT 6.8 12/12/2021   ALBUMIN 4.1 12/12/2021   LABGLOB 2.2 09/16/2016   AGRATIO 2.0 09/16/2016   BILITOT 1.2 12/12/2021   ALKPHOS 63 12/12/2021   AST 29 12/12/2021   ALT 24 12/12/2021   ANIONGAP 14 08/26/2022   Last lipids Lab Results  Component Value Date   CHOL 168 02/18/2022   HDL 47.70 02/18/2022   LDLCALC 110 (H) 02/18/2022   TRIG 52.0 02/18/2022   CHOLHDL 4 02/18/2022   Last hemoglobin A1c Lab Results  Component Value Date   HGBA1C 5.2 01/02/2020   Last thyroid functions Lab Results  Component Value Date   TSH 0.48 09/29/2020   IMPRESSION AND PLAN:  #1 hypertension, not ideal control but stable.  He feels weak and dizzy if his blood pressure goes below 135/75. Will continue with benazepril 20 mg twice daily. Electrolytes and creatinine today   #2 ischemic cardiomyopathy.  Asymptomatic. Appears euvolemic. Continue Farxiga 10 mg a day, benazepril 20 mg twice daily, And Lasix 20 mg twice a day. Has is status post ICD in 2021.   Electrolytes and creatinine monitoring today.   #3 restless legs syndrome, stable on alprazolam 0.25 mg, 1-2 nightly as needed.  #4 chronic allergic rhinitis. Continue Flonase, Zyrtec, and saline nasal spray. No sign of acute sinusitis at this time.  #5 prostate cancer. He has not been able to follow-up with Dr. Laverle Patter recently. Check PSA today and forward results to Dr. Laverle Patter. He plans on making a follow-up appointment with him in the next 3 to 4 months.  An After Visit Summary was printed and given to the  patient.  FOLLOW UP: Return in about 3 months (around 02/16/2023) for annual CPE (fasting). CPE 3 months Signed:  Santiago Bumpers, MD           11/16/2022

## 2023-01-05 DIAGNOSIS — R051 Acute cough: Secondary | ICD-10-CM | POA: Diagnosis not present

## 2023-01-05 DIAGNOSIS — H9203 Otalgia, bilateral: Secondary | ICD-10-CM | POA: Diagnosis not present

## 2023-01-05 DIAGNOSIS — Z03818 Encounter for observation for suspected exposure to other biological agents ruled out: Secondary | ICD-10-CM | POA: Diagnosis not present

## 2023-01-06 ENCOUNTER — Ambulatory Visit (INDEPENDENT_AMBULATORY_CARE_PROVIDER_SITE_OTHER): Payer: Medicare Other

## 2023-01-06 DIAGNOSIS — I255 Ischemic cardiomyopathy: Secondary | ICD-10-CM | POA: Diagnosis not present

## 2023-01-06 LAB — CUP PACEART REMOTE DEVICE CHECK
Battery Remaining Longevity: 50 mo
Battery Remaining Percentage: 50 %
Battery Voltage: 2.95 V
Brady Statistic AP VP Percent: 62 %
Brady Statistic AP VS Percent: 27 %
Brady Statistic AS VP Percent: 2.8 %
Brady Statistic AS VS Percent: 4.6 %
Brady Statistic RA Percent Paced: 86 %
Brady Statistic RV Percent Paced: 65 %
Date Time Interrogation Session: 20241206010026
HighPow Impedance: 66 Ohm
Implantable Lead Connection Status: 753985
Implantable Lead Connection Status: 753985
Implantable Lead Implant Date: 20210312
Implantable Lead Implant Date: 20210312
Implantable Lead Location: 753859
Implantable Lead Location: 753860
Implantable Pulse Generator Implant Date: 20210312
Lead Channel Impedance Value: 400 Ohm
Lead Channel Impedance Value: 440 Ohm
Lead Channel Pacing Threshold Amplitude: 0.5 V
Lead Channel Pacing Threshold Amplitude: 0.875 V
Lead Channel Pacing Threshold Pulse Width: 0.5 ms
Lead Channel Pacing Threshold Pulse Width: 0.5 ms
Lead Channel Sensing Intrinsic Amplitude: 1.8 mV
Lead Channel Sensing Intrinsic Amplitude: 12 mV
Lead Channel Setting Pacing Amplitude: 1.125
Lead Channel Setting Pacing Amplitude: 1.5 V
Lead Channel Setting Pacing Pulse Width: 0.5 ms
Lead Channel Setting Sensing Sensitivity: 0.5 mV
Pulse Gen Serial Number: 111016652
Zone Setting Status: 755011

## 2023-02-22 ENCOUNTER — Encounter: Payer: Medicare Other | Admitting: Family Medicine

## 2023-02-22 NOTE — Progress Notes (Deleted)
Office Note 02/22/2023  CC: No chief complaint on file.  Patient is a 78 y.o. male who is here for annual health maintenance exam and 54-month follow-up hypertension, ischemic cardiomyopathy, and restless leg syndrome. A/P as of last visit: "#1 hypertension, not ideal control but stable.  He feels weak and dizzy if his blood pressure goes below 135/75. Will continue with benazepril 20 mg twice daily. Electrolytes and creatinine today   #2 ischemic cardiomyopathy.  Asymptomatic. Appears euvolemic. Continue Farxiga 10 mg a day, benazepril 20 mg twice daily, And Lasix 20 mg twice a day. Has is status post ICD in 2021.   Electrolytes and creatinine monitoring today.   #3 restless legs syndrome, stable on alprazolam 0.25 mg, 1-2 nightly as needed.   #4 chronic allergic rhinitis. Continue Flonase, Zyrtec, and saline nasal spray. No sign of acute sinusitis at this time.   #5 prostate cancer. He has not been able to follow-up with Dr. Laverle Patter recently. Check PSA today and forward results to Dr. Laverle Patter. He plans on making a follow-up appointment with him in the next 3 to 4 months."  INTERIM HX: ***  PMP AWARE reviewed today: most recent rx for alprazolam was filled 08/18/2022, # 60, rx by me. No red flags.  Past Medical History:  Diagnosis Date   Abdominal aortic aneurysm (HCC) 01/2021   4 cm, infrarenal.  Also enlarged left common iliac artery--vascular eval 03/2021->rpt aortic u/s 06/01/22 stable Aorta 4.2 cm.  Bilat common iliac aneurisms and left common femoral aneurism-->f/u 1 yr   Anxiety    Atrial fibrillation [facility])    Limited episode, emergency room, June, 2013, spontaneous conversion to sinus rhythm, was evaluated By Dr. Myrtis Ser 2013- no further visits.  Post-op (CABG) a-fib, converted on amio but pt self d/c'd this med due to DOE/side effect profile.   CAD (coronary artery disease) 05/2016   a. 05/2016: NSTEMI with cath showing 3-vessel disease. Underwent CABG on 05/12/16 with  LIMA-D1, SVG-LAD, and SVG-PDA.    COPD (chronic obstructive pulmonary disease) (HCC) fall 2016   Bullous changes noted on lower lung images of CT abd done by urology   Diverticulitis    Double vision 01/2020   MG ab w/u NEG.  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 Unable to have MRI due to having shrapnel in forehead.   Hyperlipidemia    statin intolerant. Pt declined advanced lipid clinic referral 08/2019, 03/2020, and 01/2021.   Hypertension    Ischemic cardiomyopathy 05/2016   EF 20-25% at the time of NSTEMI/CABG.  Repeat EF 07/2016 30-35%.  07/2017 EF 25-30%, pt intol of entresto, bidil, and BB--for ICD 03/2019.   Microscopic hematuria Fall 2016   CT nl except nonobstructing stones.  Cystoscopy normal 12/30/14 (Dr. 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  Urol (Dr. Nechama Guard) at [facility]; Dr. Nechama Guard referred him to Dr. Laverle Patter 03/2015: patient got bilat nerve sparing , robot assisted laparoscopic radical prostatectomy  and pelvic lymphadenectomy.  Urol f/u, PSA surveillance showing very mild uptrend of PSA but not to the level of biochemical recurrence as of 05/2021 urology follow-up   RLS (restless legs syndrome)    Tobacco dependence    down to 1-2 cigs per day as of 11/2015.  Restarted 08/2016.   Urinary retention 05/2015   occurred s/p foley removal    Past Surgical History:  Procedure Laterality Date   aortic ultrasound  07/2012   2014 NO AAA.   01/2021-->4 cm AAA with R common iliac ectasia-->vasc surg ref   COLONOSCOPY  approx 2011 per pt   Done by surgeon in [place] after a bout of diverticulitis; normal per pt report--was told to repeat in 10 yrs.   CORONARY ARTERY BYPASS GRAFT N/A 05/12/2016   Procedure: CORONARY ARTERY BYPASS GRAFTING (CABG) TIMES THREE USING LEFT INTERNAL MAMMARY ARTERY AND RIGHT SAPHENOUS LEG VEIN HARVESTED ENDOSCOPICALLY;  Surgeon: Loreli Slot, MD;  Location: [facility] OR;  Service: Open Heart Surgery;  Laterality: N/A;   ICD IMPLANT N/A 04/12/2019   Procedure: ICD IMPLANT;  Surgeon: Marinus Maw, MD;  Location: [facility] INVASIVE CV LAB;  Service: Cardiovascular;  Laterality: N/A;   INGUINAL HERNIA REPAIR  2003 and 2004   Both sides.   LAPAROSCOPY  2017   LEFT HEART CATH AND CORONARY ANGIOGRAPHY N/A 05/09/2016   Procedure: Left Heart Cath and Coronary Angiography;  Surgeon: Lennette Bihari, MD;  Location: [facility] INVASIVE CV LAB;  Service: Cardiovascular;  Laterality: N/A;   LITHOTRIPSY  2004   Dr. Rito Ehrlich in [place].   LYMPHADENECTOMY Bilateral 04/30/2015   Procedure: PELVIC LYMPHADENECTOMY;  Surgeon: Heloise Purpura, MD;  Location: [facility] ORS;  Service: Urology;  Laterality: Bilateral;   PROSTATE BIOPSY  02/05/2015   Prostate adenocarcinoma: pt considering treatment options as of 02/19/15   ROBOT ASSISTED LAPAROSCOPIC RADICAL PROSTATECTOMY N/A 04/30/2015   Procedure: XI ROBOTIC ASSISTED LAPAROSCOPIC RADICAL PROSTATECTOMY LEVEL 2;  Surgeon: Heloise Purpura, MD;  Location: [facility] ORS;  Service: Urology;  Laterality: N/A;   TEE WITHOUT CARDIOVERSION N/A 05/12/2016   Procedure: TRANSESOPHAGEAL ECHOCARDIOGRAM (TEE);  Surgeon: Loreli Slot, MD;  Location: [facility] OR;  Service: Open Heart Surgery;  Laterality: N/A;   TRANSTHORACIC ECHOCARDIOGRAM  03/22/2006; 05/2016; 07/2016; 07/2017; 01/02/20   2008: EF 65%, normal valves, no wall motion abnormalties, LA size normal.  05/2016 in context of non-STEMI, EF 20-25%, mild AV and MV regurg.   08/05/16: EF 30-35%, akinesis of mid lateral myoc, grd II DD, mild aortic 07/2017: EF 25-30%, mult areas of akinesis, grd I DD. 01/2020 EF 25-30%, sev LV dsfxn, valves fine, aortic root 40mm    Family History  Problem Relation Age of Onset   Hypertension Mother    Cancer - Other Mother 20       breast   Cancer Father        Lung ca age 59   Diabetes Sister     Social History   Socioeconomic History   Marital status: Married    Spouse name: Not on file   Number of children: Not on file   Years of education: Not on file   Highest education level: Not on file  Occupational History   Not on file  Tobacco Use   Smoking status: Some Days    Current packs/day: 1.00    Average packs/day: 1 pack/day for 55.0 years (55.0 ttl pk-yrs)    Types: Cigarettes   Smokeless tobacco: Never   Tobacco comments:  quit in 2017  Vaping Use   Vaping status: Never Used  Substance and Sexual Activity   Alcohol use: No   Drug use: No   Sexual activity: Not on file  Other Topics Concern   Not on file  Social History Narrative   Married, 2 grown children.   HS education.  Orig from [place], lives there now.   Occupation: maintenance.  Drives a tractor a lot, drives a pickup truck a lot, rides horses a lot.   Tobacco: 20 pack-yr hx (current as of 07/2012)   No drugs.   Alcohol: none   Social Drivers of Corporate investment banker Strain: Low Risk  (09/29/2021)   Overall Financial Resource Strain (CARDIA)    Difficulty of Paying Living Expenses: Not hard at all  Food Insecurity: No Food Insecurity (09/29/2021)   Hunger Vital Sign    Worried About Running Out of Food in the Last Year: Never true    Ran Out of Food in the Last Year: Never true  Transportation Needs: No Transportation Needs (09/29/2021)   PRAPARE - Administrator, Civil Service (Medical): No    Lack of Transportation (Non-Medical): No  Physical Activity: Inactive (09/29/2021)   Exercise Vital Sign    Days of  Exercise per Week: 0 days    Minutes of Exercise per Session: 0 min  Stress: No Stress Concern Present (09/29/2021)   [employer] of Occupational Health - Occupational Stress Questionnaire    Feeling of Stress : Not at all  Social Connections: Moderately Integrated (09/29/2021)   Social Connection and Isolation Panel [NHANES]    Frequency of Communication with Friends and Family: More than three times a week    Frequency of Social Gatherings with Friends and Family: More than three times a week    Attends Religious Services: More than 4 times per year    Active Member of [employer] or Organizations: No    Attends Banker Meetings: Never    Marital Status: Married  Catering manager Violence: Not At Risk (09/29/2021)   Humiliation, Afraid, Rape, and Kick questionnaire    Fear of Current or Ex-Partner: No    Emotionally Abused: No    Physically Abused: No    Sexually Abused: No    Outpatient Medications Prior to Visit  Medication Sig Dispense Refill   ALPRAZolam (XANAX) 0.25 MG tablet tAKE 1-2 tabLETS BY MOUTH AT BEDTIME AS NEEDED FOR insomnia/restless legs 60 tablet 1   aspirin EC 81 MG tablet Take 1 tablet (81 mg total) by mouth daily. Swallow whole. 90 tablet 3   benazepril (LOTENSIN) 20 MG tablet Take 1 tablet (20 mg total) by mouth daily. 1 tab po bid 180 tablet 3   dapagliflozin propanediol (FARXIGA) 10 MG TABS tablet TAKE ONE TABLET BY MOUTH DAILY BEFORE BREAKFAST 30 tablet 11   fluticasone (FLONASE) 50 MCG/ACT nasal spray Place 2 sprays into both nostrils daily. 16 g 6   furosemide (LASIX) 20 MG tablet 1 tab po bid (Patient taking differently: Take 20 mg by mouth 2 (two) times daily. 1 tab po bid) 180 tablet 3   Omega-3 Fatty Acids (FISH OIL) 1000 MG CAPS Take 1,000 mg by mouth daily.      ondansetron (ZOFRAN) 4 MG tablet Take 1 tablet (4 mg total) by mouth every 8 (eight) hours as needed for nausea or vomiting. 20 tablet 1   Polyethyl Glycol-Propyl Glycol (LUBRICANT  EYE DROPS) 0.4-0.3 % SOLN Place 1 drop into  both eyes 3 (three) times daily as needed (dry/irritated eyes.).     Vitamin D, Cholecalciferol, 400 units CAPS Take 400 Units by mouth daily.      No facility-administered medications prior to visit.    Allergies  Allergen Reactions   Entresto [Sacubitril-Valsartan] Hives and Shortness Of Breath   Amlodipine Swelling    LE swelling   Contrast Media [Iodinated Contrast Media] Other (See Comments)    Prostate problem had to wear a catheter for two weeks after having dye.    Hydralazine Palpitations and Other (See Comments)    Fatigue+    Review of Systems *** PE;    11/16/2022    9:51 AM 09/29/2022   10:59 AM 08/31/2022   11:24 AM  Vitals with BMI  Height  5\' 11"    Weight 142 lbs 13 oz 141 lbs 13 oz 142 lbs  BMI  19.79 19.81  Systolic 167 132 253  Diastolic 77 78 70  Pulse 73 63      *** Pertinent labs:  Lab Results  Component Value Date   TSH 0.48 09/29/2020   Lab Results  Component Value Date   WBC 6.2 08/26/2022   HGB 15.7 08/26/2022   HCT 48.3 08/26/2022   MCV 99.4 08/26/2022   PLT 131 (L) 08/26/2022   Lab Results  Component Value Date   CREATININE 0.99 11/16/2022   BUN 20 11/16/2022   NA 139 11/16/2022   K 4.5 11/16/2022   CL 102 11/16/2022   CO2 26 11/16/2022   Lab Results  Component Value Date   ALT 24 12/12/2021   AST 29 12/12/2021   ALKPHOS 63 12/12/2021   BILITOT 1.2 12/12/2021   Lab Results  Component Value Date   CHOL 168 02/18/2022   Lab Results  Component Value Date   HDL 47.70 02/18/2022   Lab Results  Component Value Date   LDLCALC 110 (H) 02/18/2022   Lab Results  Component Value Date   TRIG 52.0 02/18/2022   Lab Results  Component Value Date   CHOLHDL 4 02/18/2022   Lab Results  Component Value Date   PSA 0.27 11/16/2022   PSA 0.11 11/17/2021   PSA 0.15 06/22/2021   Lab Results  Component Value Date   HGBA1C 5.2 01/02/2020   ASSESSMENT AND PLAN:   No  problem-specific Assessment & Plan notes found for this encounter.  #1 health maintenance exam: Reviewed age and gender appropriate health maintenance issues (prudent diet, regular exercise, health risks of tobacco and excessive alcohol, use of seatbelts, fire alarms in home, use of sunscreen).  Also reviewed age and gender appropriate health screening as well as vaccine recommendations. Vaccines: Shingrix->declined.  Tdap-->declined.  Flu->declined.*** Labs: lipid panel, bmet. Prostate ca screening: has hx of prostate ca, followed by urol, most recent PSA 11/2022 was 0.27-->result forwarded to urol at that time. Colon ca screening: last colonoscopy 2011, no polyps per pt report-->due for repeat-->he defers at this time.  An After Visit Summary was printed and given to the patient.  FOLLOW UP:  No follow-ups on file.  Signed:  Santiago Bumpers, MD           02/22/2023

## 2023-03-03 ENCOUNTER — Telehealth: Payer: Self-pay

## 2023-03-03 NOTE — Telephone Encounter (Signed)
Pls reassure him-->if he is not having any symptoms of covid then no medication is needed (even if he has tested positive).

## 2023-03-03 NOTE — Telephone Encounter (Signed)
I assume the request is for Paxlovid? I need to know some details: When did his symptoms start?  Does he have any shortness of breath?

## 2023-03-03 NOTE — Telephone Encounter (Signed)
Please advise   >> Mar 03, 2023  9:46 AM Aletta Edouard wrote: Most Recent Primary Care Visit:  Provider: Jeoffrey Massed  Department: [facility]  Visit Type: OFFICE VISIT  Date: 11/16/2022  Medication: patient has tested for covid with a at home health he would like a prescription called in patient would like a call back today   Has the patient contacted their pharmacy? No (Agent: If no, request that the patient contact the pharmacy for the refill. If patient does not wish to contact the pharmacy document the reason why and proceed with request.) (Agent: If yes, when and what did the pharmacy advise?)  Is this the correct pharmacy for this prescription? Yes If no, delete pharmacy and type the correct one.  This is the patient's preferred pharmacy:  [facility] [place], [place] - [address] [place] [address] Phone: [phone] Fax: [phone]  [id number] - [place], [place] - [address] [place] [address] Phone: [phone] Fax: [phone]   Has the prescription been filled recently? No  Is the patient out of the medication? No  Has the patient been seen for an appointment in the last year OR does the patient have an upcoming appointment? Yes  Can we respond through MyChart? No  Agent: Please be advised that Rx refills may take up to 3 business days. We ask that you follow-up with your pharmacy.

## 2023-03-03 NOTE — Telephone Encounter (Signed)
Spoke with pt and he said he tested positive yesterday. He has no symptoms but wanted to see if he needed anything just in case.His wife tested positive earlier this week He is willing to get something from otc instead of Paxlovid if that's what's recommended

## 2023-03-06 ENCOUNTER — Telehealth: Payer: Self-pay | Admitting: Family Medicine

## 2023-03-06 NOTE — Telephone Encounter (Signed)
Copied from CRM [id number]. Topic: Clinical - Medical Advice >> Mar 06, 2023  9:35 AM Pascal Lux wrote: Reason for CRM: Patient called and stated last Tuesday tested positive for covid: symptoms head and chest congestion. Now tested negative on Sunday 2.2.25 but is looking for an over the counter medication he can use for his congestion. Cell: [phone]

## 2023-03-06 NOTE — Telephone Encounter (Signed)
Pt is now having congestion and wants to see if there is something he can take otc. Please advise

## 2023-03-06 NOTE — Telephone Encounter (Signed)
Mucinex DM tabs as directed on the packaging.

## 2023-03-07 NOTE — Telephone Encounter (Signed)
 Pt advised.

## 2023-03-10 ENCOUNTER — Other Ambulatory Visit: Payer: Self-pay | Admitting: Family Medicine

## 2023-03-10 ENCOUNTER — Telehealth: Payer: Self-pay

## 2023-03-10 NOTE — Telephone Encounter (Signed)
 Left detailed message for patient per his request.

## 2023-03-10 NOTE — Telephone Encounter (Signed)
 He should already be taking his Flonase  every day.  He can increase this medicine to twice a day. If he is not already taking Claritin then he should take 10 mg tab daily--over-the-counter. Continue taking Mucinex . If not doing saline nasal spray already then he should do this several times per day to clear his nostrils. There is no further medication to add.  If not feeling significantly improved over the weekend then he should see me.

## 2023-03-10 NOTE — Telephone Encounter (Signed)
 Copied from CRM [id number]. Topic: Clinical - Medication Question >> Mar 10, 2023  9:46 AM Daniel [name] wrote: Reason for CRM: Patient would like to know if Dr. Candise could send in something in for head congestion, stated over the counter medication is not and have not been working Mucus still white & thick/ please call [phone] if no answer he would like a voicemail left   Please advise if medication can be sent or appt needed prior

## 2023-03-20 DIAGNOSIS — R051 Acute cough: Secondary | ICD-10-CM | POA: Diagnosis not present

## 2023-03-20 DIAGNOSIS — R0981 Nasal congestion: Secondary | ICD-10-CM | POA: Diagnosis not present

## 2023-04-07 ENCOUNTER — Ambulatory Visit (INDEPENDENT_AMBULATORY_CARE_PROVIDER_SITE_OTHER): Payer: Medicare Other

## 2023-04-07 DIAGNOSIS — I255 Ischemic cardiomyopathy: Secondary | ICD-10-CM

## 2023-04-09 LAB — CUP PACEART REMOTE DEVICE CHECK
Battery Remaining Longevity: 48 mo
Battery Remaining Percentage: 48 %
Battery Voltage: 2.95 V
Brady Statistic AP VP Percent: 58 %
Brady Statistic AP VS Percent: 30 %
Brady Statistic AS VP Percent: 3.3 %
Brady Statistic AS VS Percent: 5.4 %
Brady Statistic RA Percent Paced: 85 %
Brady Statistic RV Percent Paced: 61 %
Date Time Interrogation Session: 20250307020239
HighPow Impedance: 61 Ohm
Implantable Lead Connection Status: 753985
Implantable Lead Connection Status: 753985
Implantable Lead Implant Date: 20210312
Implantable Lead Implant Date: 20210312
Implantable Lead Location: 753859
Implantable Lead Location: 753860
Implantable Pulse Generator Implant Date: 20210312
Lead Channel Impedance Value: 400 Ohm
Lead Channel Impedance Value: 440 Ohm
Lead Channel Pacing Threshold Amplitude: 0.375 V
Lead Channel Pacing Threshold Amplitude: 0.75 V
Lead Channel Pacing Threshold Pulse Width: 0.5 ms
Lead Channel Pacing Threshold Pulse Width: 0.5 ms
Lead Channel Sensing Intrinsic Amplitude: 12 mV
Lead Channel Sensing Intrinsic Amplitude: 2.4 mV
Lead Channel Setting Pacing Amplitude: 1 V
Lead Channel Setting Pacing Amplitude: 1.375
Lead Channel Setting Pacing Pulse Width: 0.5 ms
Lead Channel Setting Sensing Sensitivity: 0.5 mV
Pulse Gen Serial Number: 111016652
Zone Setting Status: 755011

## 2023-05-09 NOTE — Addendum Note (Signed)
 Addended by: Elease Etienne A on: 05/09/2023 02:07 PM   Modules accepted: Orders

## 2023-05-09 NOTE — Progress Notes (Signed)
 Remote ICD transmission.

## 2023-05-15 ENCOUNTER — Other Ambulatory Visit: Payer: Self-pay | Admitting: Family Medicine

## 2023-05-24 ENCOUNTER — Encounter: Payer: Self-pay | Admitting: Family Medicine

## 2023-05-24 ENCOUNTER — Ambulatory Visit: Admitting: Family Medicine

## 2023-05-24 VITALS — BP 140/82 | HR 72 | Temp 97.9°F | Ht 69.75 in | Wt 142.6 lb

## 2023-05-24 DIAGNOSIS — Z0000 Encounter for general adult medical examination without abnormal findings: Secondary | ICD-10-CM

## 2023-05-24 DIAGNOSIS — I10 Essential (primary) hypertension: Secondary | ICD-10-CM

## 2023-05-24 DIAGNOSIS — I255 Ischemic cardiomyopathy: Secondary | ICD-10-CM

## 2023-05-24 DIAGNOSIS — Z79899 Other long term (current) drug therapy: Secondary | ICD-10-CM

## 2023-05-24 DIAGNOSIS — E7800 Pure hypercholesterolemia, unspecified: Secondary | ICD-10-CM

## 2023-05-24 DIAGNOSIS — G2581 Restless legs syndrome: Secondary | ICD-10-CM | POA: Diagnosis not present

## 2023-05-24 DIAGNOSIS — Z125 Encounter for screening for malignant neoplasm of prostate: Secondary | ICD-10-CM

## 2023-05-24 DIAGNOSIS — Z8546 Personal history of malignant neoplasm of prostate: Secondary | ICD-10-CM | POA: Diagnosis not present

## 2023-05-24 LAB — COMPREHENSIVE METABOLIC PANEL WITH GFR
ALT: 14 U/L (ref 0–53)
AST: 20 U/L (ref 0–37)
Albumin: 4.2 g/dL (ref 3.5–5.2)
Alkaline Phosphatase: 81 U/L (ref 39–117)
BUN: 22 mg/dL (ref 6–23)
CO2: 30 meq/L (ref 19–32)
Calcium: 9.7 mg/dL (ref 8.4–10.5)
Chloride: 102 meq/L (ref 96–112)
Creatinine, Ser: 0.84 mg/dL (ref 0.40–1.50)
GFR: 83.79 mL/min (ref >60.00)
Glucose, Bld: 98 mg/dL (ref 70–99)
Potassium: 4.6 meq/L (ref 3.5–5.1)
Sodium: 138 meq/L (ref 135–145)
Total Bilirubin: 1.2 mg/dL (ref 0.2–1.2)
Total Protein: 6.5 g/dL (ref 6.0–8.3)

## 2023-05-24 LAB — CBC WITH DIFFERENTIAL/PLATELET
Basophils Absolute: 0 10*3/uL (ref 0.0–0.1)
Basophils Relative: 0.4 % (ref 0.0–3.0)
Eosinophils Absolute: 0.1 10*3/uL (ref 0.0–0.7)
Eosinophils Relative: 1.1 % (ref 0.0–5.0)
HCT: 48.4 % (ref 39.0–52.0)
Hemoglobin: 16.2 g/dL (ref 13.0–17.0)
Lymphocytes Relative: 16.1 % (ref 12.0–46.0)
Lymphs Abs: 1 10*3/uL (ref 0.7–4.0)
MCHC: 33.6 g/dL (ref 30.0–36.0)
MCV: 98.6 fl (ref 78.0–100.0)
Monocytes Absolute: 0.5 10*3/uL (ref 0.1–1.0)
Monocytes Relative: 7.8 % (ref 3.0–12.0)
Neutro Abs: 4.6 10*3/uL (ref 1.4–7.7)
Neutrophils Relative %: 74.6 % (ref 43.0–77.0)
Platelets: 160 10*3/uL (ref 150.0–400.0)
RBC: 4.91 Mil/uL (ref 4.22–5.81)
RDW: 14.5 % (ref 11.5–15.5)
WBC: 6.1 10*3/uL (ref 4.0–10.5)

## 2023-05-24 LAB — LIPID PANEL
Cholesterol: 156 mg/dL (ref 0–200)
HDL: 38.4 mg/dL — ABNORMAL LOW (ref >39.00)
LDL Cholesterol: 106 mg/dL — ABNORMAL HIGH (ref 0–99)
NonHDL: 117.84
Total CHOL/HDL Ratio: 4
Triglycerides: 61 mg/dL (ref 0.0–149.0)
VLDL: 12.2 mg/dL (ref 0.0–40.0)

## 2023-05-24 LAB — PSA, MEDICARE: PSA: 0.28 ng/mL (ref 0.10–4.00)

## 2023-05-24 MED ORDER — FUROSEMIDE 20 MG PO TABS: 20.0000 mg | ORAL_TABLET | Freq: Two times a day (BID) | ORAL | 3 refills | Status: DC

## 2023-05-24 NOTE — Progress Notes (Signed)
 Office Note 05/24/2023  CC:  Chief Complaint  Patient presents with   Annual Exam    Pt is fasting   Patient is a 78 y.o. male who is here for annual health maintenance exam and 27-month follow-up hypertension, ischemic cardiomyopathy, and restless leg syndrome. A/P as of last visit: "#1 hypertension, not ideal control but stable.  He feels weak and dizzy if his blood pressure goes below 135/75. Will continue with benazepril  20 mg twice daily. Electrolytes and creatinine today   #2 ischemic cardiomyopathy.  Asymptomatic. Appears euvolemic. Continue Farxiga  10 mg a day, benazepril  20 mg twice daily, And Lasix  20 mg twice a day. Has is status post ICD in 2021.   Electrolytes and creatinine monitoring today.   #3 restless legs syndrome, stable on alprazolam  0.25 mg, 1-2 nightly as needed.   #4 chronic allergic rhinitis. Continue Flonase , Zyrtec , and saline nasal spray. No sign of acute sinusitis at this time.   #5 prostate cancer. He has not been able to follow-up with Dr. Rozanne Corners recently. Check PSA today and forward results to Dr. Rozanne Corners. 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  She is home from rehab and has gotten some home PT and is doing better so he feels the pressure has come off lately.  He occasionally gets a little bit of swelling in his ankles but this goes down at night. No dyspnea on exertion.  No chest pain, no dizziness, no palpitations. No abdominal pain.  No pain in his legs.   Restless leg symptoms well-controlled with alprazolam . PMP AWARE reviewed today: most recent rx for alprazolam  was filled 08/18/2022, # 60, rx by me. No red flags.   Past Medical History:  Diagnosis Date   Abdominal aortic aneurysm (HCC) 01/2021   4 cm, infrarenal.  Also enlarged left common iliac  artery--vascular eval 03/2021->rpt aortic u/s 06/01/22 stable Aorta 4.2 cm.  Bilat common iliac aneurisms and left common femoral aneurism-->f/u 1 yr   Anxiety    Atrial fibrillation [facility])    Limited episode, emergency room, June, 2013, spontaneous conversion to sinus rhythm, was evaluated By Dr. Ardell Koller 2013- no further visits.  Post-op (CABG) a-fib, converted on amio but pt self d/c'd this med due to DOE/side effect profile.   CAD (coronary artery disease) 05/2016   a. 05/2016: NSTEMI with cath showing 3-vessel disease. Underwent CABG on 05/12/16 with LIMA-D1, SVG-LAD, and SVG-PDA.    COPD (chronic obstructive pulmonary disease) (HCC) fall 2016   Bullous changes noted on lower lung images of CT abd done by urology   Diverticulitis    Double vision 01/2020   MG ab w/u NEG.  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 Unable to have MRI due to having shrapnel in forehead.   Hyperlipidemia    statin intolerant. Pt declined advanced lipid clinic referral 08/2019, 03/2020, and 01/2021.   Hypertension    Ischemic cardiomyopathy 05/2016   EF 20-25% at the time of NSTEMI/CABG.  Repeat EF 07/2016 30-35%.  07/2017  EF 25-30%, pt intol of entresto , bidil, and BB--for ICD 03/2019.   Microscopic hematuria Fall 2016   CT nl except nonobstructing stones.  Cystoscopy normal 12/30/14 (Dr. 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  Urol (Dr. Nalani Ave) at [facility]; Dr. Nalani Ave referred him to  Dr. Rozanne Corners 03/2015: patient got bilat nerve sparing , robot assisted laparoscopic radical prostatectomy and pelvic lymphadenectomy.  Urol f/u, PSA surveillance showing very mild uptrend of PSA but not to the level of biochemical recurrence as of 05/2021 urology follow-up   RLS (restless legs syndrome)    Tobacco dependence    down to 1-2 cigs per day as of 11/2015.  Restarted 08/2016.   Urinary retention 05/2015   occurred s/p foley removal    Past Surgical History:  Procedure Laterality Date   aortic ultrasound  07/2012   2014 NO AAA.  01/2021-->4 cm AAA with R common iliac ectasia-->vasc surg ref   COLONOSCOPY  approx 2011 per pt   Done by surgeon in [place] after a bout of diverticulitis; normal per pt report--was told to repeat in 10 yrs.   CORONARY ARTERY BYPASS GRAFT N/A 05/12/2016   Procedure: CORONARY ARTERY BYPASS GRAFTING (CABG) TIMES THREE USING LEFT INTERNAL MAMMARY ARTERY AND RIGHT SAPHENOUS LEG VEIN HARVESTED ENDOSCOPICALLY;  Surgeon: Zelphia Higashi, MD;  Location[facility] OR;  Service: Open Heart Surgery;  Laterality: N/A;   ICD IMPLANT N/A 04/12/2019   Procedure: ICD IMPLANT;  Surgeon: Tammie Fall, MD;  Location: [facility] INVASIVE CV LAB;  Service: Cardiovascular;  Laterality: N/A;   INGUINAL HERNIA REPAIR  2003 and 2004   Both sides.   LAPAROSCOPY  2017   LEFT HEART CATH AND CORONARY ANGIOGRAPHY N/A 05/09/2016   Procedure: Left Heart Cath and Coronary Angiography;  Surgeon: Millicent Ally, MD;  Location: [facility] INVASIVE CV LAB;  Service: Cardiovascular;  Laterality: N/A;   LITHOTRIPSY  2004   Dr. Lyndon Santiago in [place].   LYMPHADENECTOMY Bilateral 04/30/2015   Procedure: PELVIC LYMPHADENECTOMY;  Surgeon: Florencio Hunting, MD;  Location: [facility] ORS;  Service: Urology;  Laterality: Bilateral;   PROSTATE BIOPSY  02/05/2015   Prostate adenocarcinoma: pt considering treatment options as of 02/19/15   ROBOT ASSISTED LAPAROSCOPIC RADICAL PROSTATECTOMY N/A 04/30/2015   Procedure: XI ROBOTIC ASSISTED  LAPAROSCOPIC RADICAL PROSTATECTOMY LEVEL 2;  Surgeon: Florencio Hunting, MD;  Location: [facility] ORS;  Service: Urology;  Laterality: N/A;   TEE WITHOUT CARDIOVERSION N/A 05/12/2016   Procedure: TRANSESOPHAGEAL ECHOCARDIOGRAM (TEE);  Surgeon: Zelphia Higashi, MD;  Location: [facility] OR;  Service: Open Heart Surgery;  Laterality: N/A;   TRANSTHORACIC ECHOCARDIOGRAM  03/22/2006; 05/2016; 07/2016; 07/2017; 01/02/20   2008: EF 65%, normal valves, no wall motion abnormalties, LA size normal.  05/2016 in context of non-STEMI, EF 20-25%, mild AV and MV regurg.  08/05/16: EF 30-35%, akinesis of mid lateral myoc, grd II DD, mild aortic 07/2017: EF 25-30%, mult areas of akinesis, grd I DD. 01/2020 EF 25-30%, sev LV dsfxn, valves fine, aortic root 40mm    Family History  Problem Relation Age of Onset   Hypertension Mother    Cancer - Other Mother 59       breast   Cancer Father        Lung ca  age 25   Diabetes Sister     Social History   Socioeconomic History   Marital status: Married    Spouse name: Not on file   Number of children: Not on file   Years of education: Not on file   Highest education level: Not on file  Occupational History   Not on file  Tobacco Use   Smoking status: Some Days    Current packs/day: 1.00    Average packs/day: 1 pack/day for 55.0 years (55.0 ttl pk-yrs)    Types: Cigarettes   Smokeless tobacco: Never   Tobacco comments:    quit in 2017  Vaping Use   Vaping status: Never Used  Substance and Sexual Activity   Alcohol use: No   Drug use: No   Sexual activity: Not on file  Other Topics Concern   Not on file  Social History Narrative   Married, 2 grown children.   HS education.  Orig from [place], lives there now.   Occupation: maintenance.  Drives a tractor a lot, drives a pickup truck a lot, rides horses a lot.   Tobacco: 20 pack-yr hx (current as of 07/2012)   No drugs.   Alcohol: none   Social Drivers of Corporate investment banker Strain: Low Risk  (09/29/2021)    Overall Financial Resource Strain (CARDIA)    Difficulty of Paying Living Expenses: Not hard at all  Food Insecurity: No Food Insecurity (09/29/2021)   Hunger Vital Sign    Worried About Running Out of Food in the Last Year: Never true    Ran Out of Food in the Last Year: Never true  Transportation Needs: No Transportation Needs (09/29/2021)   PRAPARE - Administrator, Civil Service (Medical): No    Lack of Transportation (Non-Medical): No  Physical Activity: Inactive (09/29/2021)   Exercise Vital Sign    Days of Exercise per Week: 0 days    Minutes of Exercise per Session: 0 min  Stress: No Stress Concern Present (09/29/2021)   [employer] of Occupational Health - Occupational Stress Questionnaire    Feeling of Stress : Not at all  Social Connections: Moderately Integrated (09/29/2021)   Social Connection and Isolation Panel [NHANES]    Frequency of Communication with Friends and Family: More than three times a week    Frequency of Social Gatherings with Friends and Family: More than three times a week    Attends Religious Services: More than 4 times per year    Active Member of [employer] or Organizations: No    Attends Banker Meetings: Never    Marital Status: Married  Catering manager Violence: Not At Risk (09/29/2021)   Humiliation, Afraid, Rape, and Kick questionnaire    Fear of Current or Ex-Partner: No    Emotionally Abused: No    Physically Abused: No    Sexually Abused: No    Outpatient Medications Prior to Visit  Medication Sig Dispense Refill   ALPRAZolam  (XANAX ) 0.25 MG tablet tAKE 1-2 tabLETS BY MOUTH AT BEDTIME AS NEEDED FOR insomnia/restless legs 60 tablet 1   aspirin  EC 81 MG tablet Take 1 tablet (81 mg total) by mouth daily. Swallow whole. 90 tablet 3   benazepril  (LOTENSIN ) 20 MG tablet Take 1 tablet (20 mg total) by mouth daily. 1 tab po bid 180 tablet 3   dapagliflozin  propanediol (FARXIGA ) 10 MG TABS tablet TAKE ONE TABLET BY MOUTH  DAILY BEFORE BREAKFAST 30 tablet 11   fluticasone  (FLONASE )  50 MCG/ACT nasal spray Place 2 sprays into both nostrils daily. 16 g 6   Omega-3 Fatty Acids (FISH OIL) 1000 MG CAPS Take 1,000 mg by mouth daily.      Polyethyl Glycol-Propyl Glycol (LUBRICANT EYE DROPS) 0.4-0.3 % SOLN Place 1 drop into both eyes 3 (three) times daily as needed (dry/irritated eyes.).     Vitamin D, Cholecalciferol, 400 units CAPS Take 400 Units by mouth daily.      ondansetron  (ZOFRAN ) 4 MG tablet Take 1 tablet (4 mg total) by mouth every 8 (eight) hours as needed for nausea or vomiting. (Patient not taking: Reported on 05/24/2023) 20 tablet 1   furosemide  (LASIX ) 20 MG tablet TAKE ONE TABLET BY MOUTH TWICE DAILY 60 tablet 0   No facility-administered medications prior to visit.    Allergies  Allergen Reactions   Entresto  [Sacubitril -Valsartan ] Hives and Shortness Of Breath   Amlodipine  Swelling    LE swelling   Contrast Media [Iodinated Contrast Media] Other (See Comments)    Prostate problem had to wear a catheter for two weeks after having dye.    Hydralazine  Palpitations and Other (See Comments)    Fatigue+    Review of Systems  Constitutional:  Negative for appetite change, chills, fatigue and fever.  HENT:  Negative for congestion, dental problem, ear pain and sore throat.   Eyes:  Negative for discharge, redness and visual disturbance.  Respiratory:  Negative for cough, chest tightness, shortness of breath and wheezing.   Cardiovascular:  Negative for chest pain, palpitations and leg swelling.  Gastrointestinal:  Negative for abdominal pain, blood in stool, diarrhea, nausea and vomiting.  Genitourinary:  Negative for difficulty urinating, dysuria, flank pain, frequency, hematuria and urgency.  Musculoskeletal:  Negative for arthralgias, back pain, joint swelling, myalgias and neck stiffness.  Skin:  Negative for pallor and rash.  Neurological:  Negative for dizziness, speech difficulty, weakness and  headaches.  Hematological:  Negative for adenopathy. Does not bruise/bleed easily.  Psychiatric/Behavioral:  Negative for confusion and sleep disturbance. The patient is not nervous/anxious.     PE;    05/24/2023   10:36 AM 11/16/2022    9:51 AM 09/29/2022   10:59 AM  Vitals with BMI  Height 5' 9.75"  5\' 11"   Weight 142 lbs 10 oz 142 lbs 13 oz 141 lbs 13 oz  BMI 20.6  19.79  Systolic 153 167 161  Diastolic 85 77 78  Pulse 72 73 63   Gen: Alert, well appearing.  Patient is oriented to person, place, time, and situation. AFFECT: pleasant, lucid thought and speech. ENT: Ears: EACs clear, normal epithelium.  TMs with good light reflex and landmarks bilaterally.  Eyes: no injection, icteris, swelling, or exudate.  EOMI, PERRLA. Nose: no drainage or turbinate edema/swelling.  No injection or focal lesion.  Mouth: lips without lesion/swelling.  Oral mucosa pink and moist.  Dentition intact and without obvious caries or gingival swelling.  Oropharynx without erythema, exudate, or swelling.  Neck: supple/nontender.  No LAD, mass, or TM.  Carotid pulses 2+ bilaterally, without bruits. CV: RRR, no m/r/g.   LUNGS: CTA bilat, nonlabored resps, good aeration in all lung fields. ABD: soft, NT, ND, BS normal.  No hepatospenomegaly.  He has a pulsatile abdominal mass palpable just superior and lateral to the umbilicus, soft bruit is present.  No tenderness.   EXT: no clubbing or cyanosis.  He has trace bilateral ankle edema. Musculoskeletal: no joint swelling, erythema, warmth, or tenderness.  ROM of all joints  intact. Skin - no sores or suspicious lesions or rashes or color changes  Pertinent labs:    Chemistry      Component Value Date/Time   NA 139 11/16/2022 1019   NA 139 02/26/2019 1026   K 4.5 11/16/2022 1019   CL 102 11/16/2022 1019   CO2 26 11/16/2022 1019   BUN 20 11/16/2022 1019   BUN 15 02/26/2019 1026   CREATININE 0.99 11/16/2022 1019   CREATININE 1.10 12/17/2021 1435       Component Value Date/Time   CALCIUM  10.2 11/16/2022 1019   ALKPHOS 63 12/12/2021 0934   AST 29 12/12/2021 0934   ALT 24 12/12/2021 0934   BILITOT 1.2 12/12/2021 0934   BILITOT 0.8 09/16/2016 0858     Lab Results  Component Value Date   WBC 6.2 08/26/2022   HGB 15.7 08/26/2022   HCT 48.3 08/26/2022   MCV 99.4 08/26/2022   PLT 131 (L) 08/26/2022   Lab Results  Component Value Date   CHOL 168 02/18/2022   HDL 47.70 02/18/2022   LDLCALC 110 (H) 02/18/2022   TRIG 52.0 02/18/2022   CHOLHDL 4 02/18/2022   Lab Results  Component Value Date   TSH 0.48 09/29/2020   Lab Results  Component Value Date   HGBA1C 5.2 01/02/2020   Lab Results  Component Value Date   PSA 0.27 11/16/2022   PSA 0.11 11/17/2021   PSA 0.15 06/22/2021    ASSESSMENT AND PLAN:   #1 Health maintenance exam: Reviewed age and gender appropriate health maintenance issues (prudent diet, regular exercise, health risks of tobacco and excessive alcohol, use of seatbelts, fire alarms in home, use of sunscreen).  Also reviewed age and gender appropriate health screening as well as vaccine recommendations. Vaccines: Shingrix->declined.  Prevnar 20->declined.  Tdap-->declined.   Labs: Complete metabolic panel, CBC, lipids. Prostate ca screening: has hx of prostate ca, was followed by [facility] but I've been checking this for him lately,  most recent PSA 11/2022 was 0.27. Colon ca screening: last colonoscopy 2011, no polyps per pt report-->due for repeat-->he defers at this time.  2. hypertension, not ideal control but stable.  He feels weak and dizzy if his blood pressure goes below 135/75. Will continue with benazepril  20 mg twice daily. Electrolytes and creatinine today   #3 ischemic cardiomyopathy.  Asymptomatic. Appears euvolemic. Continue Farxiga  10 mg a day, benazepril  20 mg twice daily, And Lasix  20 mg twice a day. Has is status post ICD in 2021.   Electrolytes and creatinine monitoring today.   #4 restless  legs syndrome, stable on alprazolam  0.25 mg, 1-2 nightly as needed.  #5 prostate cancer (approx 7 yrs cancer-free) He has not been able to follow-up with Dr. Rozanne Corners recently. Will check PSA today and forward results to Dr. Rozanne Corners.  An After Visit Summary was printed and given to the patient.  FOLLOW UP:  Return in about 6 months (around 11/23/2023) for routine chronic illness f/u.  Signed:  Arletha Lady, MD           05/24/2023

## 2023-05-25 NOTE — Progress Notes (Unsigned)
 Cardiology Office Note:   Date:  05/26/2023  ID:  Annamary Kida., DOB 01/18/1946, MRN [id number] PCP: Shelvia Dick, MD  [facility] Providers Cardiologist:  Eilleen Grates, MD Electrophysiologist:  Manya Sells, MD {  History of Present Illness:   Daniel [name]. is a 78 y.o. male who presents for follow up of CAD s/p CABG LIMA to D1, SVG to LAD, SVG to PDA on 05/12/2016, HLD intolerant to statin, HTN, ICM with baseline EF 30-35%, and h/o prostate CA. His last cardiac catheterization on 05/09/2016 showed EF 20-25%, akinesis of the distal inferoapical segment with significant hypokinesis globally consistent with ischemic cardiomyopathy, multivessel disease with 70% followed by total occlusion of large LAD with distal collateral arteries, 50% proximal left circumflex stenosis with total occlusion of distal left circumflex, total occlusion of mid RCA with distal collateral artery. Bypass surgery was recommended and he eventually underwent successful CABG 3 by Dr. Luna Salinas on 05/12/2016 with LIMA to diagonal, SVG to LAD, SVG to PDA. He developed postoperative atrial fibrillation and was treated with IV amiodarone  with conversion to sinus rhythm prior to discharge. He had a follow-up echocardiogram on 08/05/2016 which revealed LV EF remains low at 30-35% however slightly improved from his previous 20-25%.  He was placed on carvedilol  and Entresto , spironolactone  was discontinued due to elevated potassium level. Unfortunately, he went back to the hospital on 11/20/2016 with shortness breath, diffuse rash and presyncope. He was given prednisone  for his rash. He stopped the Entresto  and restarted ACE inhibitor as he was sure the rash was related to the former.  Last EF in July was 25 - 30%.   He could not take the BiDil.  His blood pressure dropped too low.  He had a low heart rate and we stopped Coreg .  I did manage to get him to take Zebeta  but it turns out he will only take it if his blood pressure  is greater than 140.  He also stop spironolactone  as he thought it was making his heart skip . He now status post ICD.   He returns for follow up.  He is a little bit behind because his wife broke her femur and knees had to help take care of her. The patient denies any new symptoms such as chest discomfort, neck or arm discomfort. There has been no new shortness of breath, PND or orthopnea. There have been no reported palpitations, presyncope or syncope.        ROS: As stated in the HPI and negative for all other systems.  Studies Reviewed:    EKG:   NA    Risk Assessment/Calculations:           Physical Exam:   VS:  BP (!) 168/86   Pulse 77   Ht 5\' 10"  (1.778 m)   Wt 144 lb 6.4 oz (65.5 kg)   SpO2 97%   BMI 20.72 kg/m    Wt Readings from Last 3 Encounters:  05/26/23 144 lb 6.4 oz (65.5 kg)  05/24/23 142 lb 9.6 oz (64.7 kg)  11/16/22 142 lb 12.8 oz (64.8 kg)     GEN: Well nourished, well developed in no acute distress NECK: No JVD; No carotid bruits CARDIAC: RRR, no murmurs, rubs, gallops RESPIRATORY:  Clear to auscultation without rales, wheezing or rhonchi  ABDOMEN: Soft, non-tender, non-distended EXTREMITIES:  No edema; No deformity , The patient has no new sypmtoms.  No further cardiovascular testing is indicated.  We will continue with  aggressive risk reduction and meds as listed.    ASSESSMENT AND PLAN:   CAD s/p CABG:    The patient has no new sypmtoms.  No further cardiovascular testing is indicated.  We will continue with aggressive risk reduction and meds as listed.   Ischemic cardiomyopathy:    He has not wanted med titration.  In fact he only takes meds sometimes when his blood pressure is elevated.  We had this conversation many times over the years.  He seems to be euvolemic.  No change in therapy.    Hypertension: Blood pressure is managed loosely in the context of managing his cardiomyopathy. Hyperlipidemia:    LDL was not at target 110 but he has not  wanted med titration.    ICD: He is up-to-date with follow-up I did review the notes from Dr. Carolynne Citron.    AAA: He has a 42 mm infrarenal abdominal aneurysm and aneurysmal dilatation of the common iliacs.  He is followed by Dr. Vikki Graves.  He has follow-up Doppler scheduled.    Follow up with me in 1 year  Signed, Eilleen Grates, MD

## 2023-05-26 ENCOUNTER — Ambulatory Visit: Admitting: Cardiology

## 2023-05-26 ENCOUNTER — Encounter: Payer: Self-pay | Admitting: Cardiology

## 2023-05-26 VITALS — BP 168/86 | HR 77 | Ht 70.0 in | Wt 144.4 lb

## 2023-05-26 DIAGNOSIS — I255 Ischemic cardiomyopathy: Secondary | ICD-10-CM | POA: Diagnosis not present

## 2023-05-26 DIAGNOSIS — E785 Hyperlipidemia, unspecified: Secondary | ICD-10-CM

## 2023-05-26 DIAGNOSIS — I2510 Atherosclerotic heart disease of native coronary artery without angina pectoris: Secondary | ICD-10-CM | POA: Diagnosis not present

## 2023-05-26 NOTE — Patient Instructions (Signed)
 Medication Instructions:  Your physician recommends that you continue on your current medications as directed. Please refer to the Current Medication list given to you today.  *If you need a refill on your cardiac medications before your next appointment, please call your pharmacy*  Lab Work: NONE   If you have labs (blood work) drawn today and your tests are completely normal, you will receive your results only by: MyChart Message (if you have MyChart) OR A paper copy in the mail If you have any lab test that is abnormal or we need to change your treatment, we will call you to review the results.  Testing/Procedures: NONE   Follow-Up: At [facility], you and your health needs are our priority.  As part of our continuing mission to provide you with exceptional heart care, our providers are all part of one team.  This team includes your primary Cardiologist (physician) and Advanced Practice Providers or APPs (Physician Assistants and Nurse Practitioners) who all work together to provide you with the care you need, when you need it.  Your next appointment:   1 year(s)  Provider:   Eilleen Grates, MD    We recommend signing up for the patient portal called "MyChart".  Sign up information is provided on this After Visit Summary.  MyChart is used to connect with patients for Virtual Visits (Telemedicine).  Patients are able to view lab/test results, encounter notes, upcoming appointments, etc.  Non-urgent messages can be sent to your provider as well.   To learn more about what you can do with MyChart, go to [email or web address].   Other Instructions Thank you for choosing [facility]!

## 2023-06-07 DIAGNOSIS — R051 Acute cough: Secondary | ICD-10-CM | POA: Diagnosis not present

## 2023-06-07 DIAGNOSIS — R0981 Nasal congestion: Secondary | ICD-10-CM | POA: Diagnosis not present

## 2023-06-28 ENCOUNTER — Other Ambulatory Visit: Payer: Self-pay | Admitting: Family Medicine

## 2023-06-28 NOTE — Telephone Encounter (Signed)
 Requesting: alprazolam  Contract: 11/16/21 UDS: N/A Last Visit: 05/24/23 Next Visit: 11/23/23 Last Refill: 08/16/22 (60,1)  Please Advise. Rx pending

## 2023-07-07 ENCOUNTER — Ambulatory Visit (INDEPENDENT_AMBULATORY_CARE_PROVIDER_SITE_OTHER): Payer: Medicare Other

## 2023-07-07 DIAGNOSIS — I255 Ischemic cardiomyopathy: Secondary | ICD-10-CM

## 2023-07-07 LAB — CUP PACEART REMOTE DEVICE CHECK
Battery Remaining Longevity: 44 mo
Battery Remaining Percentage: 45 %
Battery Voltage: 2.95 V
Brady Statistic AP VP Percent: 60 %
Brady Statistic AP VS Percent: 29 %
Brady Statistic AS VP Percent: 3.2 %
Brady Statistic AS VS Percent: 5.6 %
Brady Statistic RA Percent Paced: 86 %
Brady Statistic RV Percent Paced: 63 %
Date Time Interrogation Session: 20250606030059
HighPow Impedance: 61 Ohm
Implantable Lead Connection Status: 753985
Implantable Lead Connection Status: 753985
Implantable Lead Implant Date: 20210312
Implantable Lead Implant Date: 20210312
Implantable Lead Location: 753859
Implantable Lead Location: 753860
Implantable Pulse Generator Implant Date: 20210312
Lead Channel Impedance Value: 390 Ohm
Lead Channel Impedance Value: 440 Ohm
Lead Channel Pacing Threshold Amplitude: 0.375 V
Lead Channel Pacing Threshold Amplitude: 1 V
Lead Channel Pacing Threshold Pulse Width: 0.5 ms
Lead Channel Pacing Threshold Pulse Width: 0.5 ms
Lead Channel Sensing Intrinsic Amplitude: 12 mV
Lead Channel Sensing Intrinsic Amplitude: 2.1 mV
Lead Channel Setting Pacing Amplitude: 1.25 V
Lead Channel Setting Pacing Amplitude: 1.375
Lead Channel Setting Pacing Pulse Width: 0.5 ms
Lead Channel Setting Sensing Sensitivity: 0.5 mV
Pulse Gen Serial Number: 111016652
Zone Setting Status: 755011

## 2023-07-09 ENCOUNTER — Ambulatory Visit: Payer: Self-pay | Admitting: Internal Medicine

## 2023-07-10 ENCOUNTER — Ambulatory Visit (INDEPENDENT_AMBULATORY_CARE_PROVIDER_SITE_OTHER): Admitting: Family Medicine

## 2023-07-10 ENCOUNTER — Encounter: Payer: Self-pay | Admitting: Family Medicine

## 2023-07-10 VITALS — BP 145/78 | HR 71 | Temp 98.6°F | Wt 145.8 lb

## 2023-07-10 DIAGNOSIS — H6121 Impacted cerumen, right ear: Secondary | ICD-10-CM | POA: Diagnosis not present

## 2023-07-10 MED ORDER — FUROSEMIDE 20 MG PO TABS: 20.0000 mg | ORAL_TABLET | Freq: Two times a day (BID) | ORAL | 3 refills | Status: DC

## 2023-07-10 NOTE — Progress Notes (Signed)
 OFFICE VISIT  07/10/2023  CC:  Chief Complaint  Patient presents with   Ear Fullness    Started Friday; has drainage starting saturday    Patient is a 78 y.o. male who presents for nasal/sinus congestion.  HPI: Approx 2-3 d progressive R ear fullness/dec hearing. Squishy sensation when he presses on ear area. Some PND when he lies down at night.  No sinus pressure or signif nasal drainage. He has been taking astelin and flonase  most days.  Lately putting ofloxacin ear drops in R ear.  Past Medical History:  Diagnosis Date   Abdominal aortic aneurysm (HCC) 01/2021   4 cm, infrarenal.  Also enlarged left common iliac artery--vascular eval 03/2021->rpt aortic u/s 06/01/22 stable Aorta 4.2 cm.  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 Ardell Koller 2013- no further visits.  Post-op (CABG) a-fib, converted on amio but pt self d/c'd this med due to DOE/side effect profile.   CAD (coronary artery disease) 05/2016   a. 05/2016: NSTEMI with cath showing 3-vessel disease. Underwent CABG on 05/12/16 with LIMA-D1, SVG-LAD, and SVG-PDA.    COPD (chronic obstructive pulmonary disease) (HCC) fall 2016   Bullous changes noted on lower lung images of CT abd done by urology   Diverticulitis    Double vision 01/2020   MG ab w/u NEG.  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 Unable to have MRI due to having shrapnel in forehead.   Hyperlipidemia    statin intolerant. Pt declined advanced lipid clinic referral 08/2019,  03/2020, and 01/2021.   Hypertension    Ischemic cardiomyopathy 05/2016   EF 20-25% at the time of NSTEMI/CABG.  Repeat EF 07/2016 30-35%.  07/2017 EF 25-30%, pt intol of entresto , bidil, and BB--for ICD 03/2019.   Microscopic hematuria Fall 2016   CT nl except nonobstructing stones.  Cystoscopy normal 12/30/14 (Dr. 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  Urol (Dr. Nalani Ave) at [facility]; Dr. Nalani Ave referred him to Dr. Rozanne Corners 03/2015: patient got bilat nerve sparing , robot assisted laparoscopic radical prostatectomy and pelvic lymphadenectomy.  Urol f/u, PSA surveillance showing very mild uptrend of PSA but not to the level of biochemical recurrence as of 05/2021 urology follow-up   RLS (restless legs syndrome)    Tobacco dependence    down to 1-2 cigs per day as of 11/2015.  Restarted 08/2016.   Urinary retention 05/2015   occurred s/p foley removal    Past Surgical History:  Procedure Laterality Date   aortic ultrasound  07/2012   2014 NO AAA.  01/2021-->4 cm AAA with R common iliac ectasia-->vasc surg ref   COLONOSCOPY  approx 2011 per pt   Done by surgeon in [place] after a bout of diverticulitis; normal per pt report--was told to repeat  in 10 yrs.   CORONARY ARTERY BYPASS GRAFT N/A 05/12/2016   Procedure: CORONARY ARTERY BYPASS GRAFTING (CABG) TIMES THREE USING LEFT INTERNAL MAMMARY ARTERY AND RIGHT SAPHENOUS LEG VEIN HARVESTED ENDOSCOPICALLY;  Surgeon: Zelphia Higashi, MD;  Location[facility] OR;  Service: Open Heart Surgery;  Laterality: N/A;   ICD IMPLANT N/A 04/12/2019   Procedure: ICD IMPLANT;  Surgeon: Tammie Fall, MD;  Location: [facility] INVASIVE CV LAB;  Service: Cardiovascular;  Laterality: N/A;   INGUINAL HERNIA REPAIR  2003 and 2004   Both sides.   LAPAROSCOPY  2017    LEFT HEART CATH AND CORONARY ANGIOGRAPHY N/A 05/09/2016   Procedure: Left Heart Cath and Coronary Angiography;  Surgeon: Millicent Ally, MD;  Location: [facility] INVASIVE CV LAB;  Service: Cardiovascular;  Laterality: N/A;   LITHOTRIPSY  2004   Dr. Lyndon Santiago in [place].   LYMPHADENECTOMY Bilateral 04/30/2015   Procedure: PELVIC LYMPHADENECTOMY;  Surgeon: Florencio Hunting, MD;  Location: [facility] ORS;  Service: Urology;  Laterality: Bilateral;   PROSTATE BIOPSY  02/05/2015   Prostate adenocarcinoma: pt considering treatment options as of 02/19/15   ROBOT ASSISTED LAPAROSCOPIC RADICAL PROSTATECTOMY N/A 04/30/2015   Procedure: XI ROBOTIC ASSISTED LAPAROSCOPIC RADICAL PROSTATECTOMY LEVEL 2;  Surgeon: Florencio Hunting, MD;  Location: [facility] ORS;  Service: Urology;  Laterality: N/A;   TEE WITHOUT CARDIOVERSION N/A 05/12/2016   Procedure: TRANSESOPHAGEAL ECHOCARDIOGRAM (TEE);  Surgeon: Zelphia Higashi, MD;  Location[facility] OR;  Service: Open Heart Surgery;  Laterality: N/A;   TRANSTHORACIC ECHOCARDIOGRAM  03/22/2006; 05/2016; 07/2016; 07/2017; 01/02/20   2008: EF 65%, normal valves, no wall motion abnormalties, LA size normal.  05/2016 in context of non-STEMI, EF 20-25%, mild AV and MV regurg.  08/05/16: EF 30-35%, akinesis of mid lateral myoc, grd II DD, mild aortic 07/2017: EF 25-30%, mult areas of akinesis, grd I DD. 01/2020 EF 25-30%, sev LV dsfxn, valves fine, aortic root 40mm    Outpatient Medications Prior to Visit  Medication Sig Dispense Refill   ALPRAZolam  (XANAX ) 0.25 MG tablet tAKE 1 to 2 tabLETS BY MOUTH AT BEDTIME AS NEEDED FOR insomnia/restless legs 60 tablet 1   aspirin  EC 81 MG tablet Take 1 tablet (81 mg total) by mouth daily. Swallow whole. 90 tablet 3   benazepril  (LOTENSIN ) 20 MG tablet Take 1 tablet (20 mg total) by mouth daily. 1 tab po bid 180 tablet 3   dapagliflozin  propanediol (FARXIGA ) 10 MG TABS tablet TAKE ONE TABLET BY MOUTH DAILY BEFORE BREAKFAST 30 tablet 11   fluticasone  (FLONASE ) 50 MCG/ACT nasal  spray Place 2 sprays into both nostrils daily. 16 g 6   furosemide  (LASIX ) 20 MG tablet Take 1 tablet (20 mg total) by mouth 2 (two) times daily. 180 tablet 3   Omega-3 Fatty Acids (FISH OIL) 1000 MG CAPS Take 1,000 mg by mouth daily.      ondansetron  (ZOFRAN ) 4 MG tablet Take 1 tablet (4 mg total) by mouth every 8 (eight) hours as needed for nausea or vomiting. 20 tablet 1   Polyethyl Glycol-Propyl Glycol (LUBRICANT EYE DROPS) 0.4-0.3 % SOLN Place 1 drop into both eyes 3 (three) times daily as needed (dry/irritated eyes.).     Vitamin D, Cholecalciferol, 400 units CAPS Take 400 Units by mouth daily.      No facility-administered medications prior to visit.    Allergies  Allergen Reactions   Entresto  [Sacubitril -Valsartan ] Hives and Shortness Of Breath   Amlodipine  Swelling    LE swelling   Contrast Media [Iodinated Contrast Media]  Other (See Comments)    Prostate problem had to wear a catheter for two weeks after having dye.    Hydralazine  Palpitations and Other (See Comments)    Fatigue+    Review of Systems  As per HPI  PE:    07/10/2023    3:24 PM 05/26/2023    4:14 PM 05/24/2023   10:50 AM  Vitals with BMI  Height  5\' 10"    Weight 145 lbs 13 oz 144 lbs 6 oz   BMI  20.72   Systolic 145 168 409  Diastolic 78 86 82  Pulse 71 77      Physical Exam  Gen: Alert, well appearing.  Patient is oriented to person, place, time, and situation. R EAC 100% cerumen impaction. L EAC clear, TM normal. Nose w/out mucous or congestion. No sinus TTP.  Oropharynx normal. Neck: no adenopathy  LABS:  Last metabolic panel Lab Results  Component Value Date   GLUCOSE 98 05/24/2023   NA 138 05/24/2023   K 4.6 05/24/2023   CL 102 05/24/2023   CO2 30 05/24/2023   BUN 22 05/24/2023   CREATININE 0.84 05/24/2023   GFR 83.79 05/24/2023   CALCIUM  9.7 05/24/2023   PROT 6.5 05/24/2023   ALBUMIN  4.2 05/24/2023   LABGLOB 2.2 09/16/2016   AGRATIO 2.0 09/16/2016   BILITOT 1.2 05/24/2023    ALKPHOS 81 05/24/2023   AST 20 05/24/2023   ALT 14 05/24/2023   ANIONGAP 14 08/26/2022   IMPRESSION AND PLAN:  No problem-specific Assessment & Plan notes found for this encounter.  R ear cerumen impaction. Consent obtained. Procedure: Cerumen Disimpaction  Warm water  was applied and gentle ear lavage performed on right side. There were no complications and following the disimpaction the tympanic membrane was visible on the right. Tympanic membranes are intact following the procedure.  Auditory canals are normal.  The patient reported relief of symptoms after removal of cerumen   An After Visit Summary was printed and given to the patient.  FOLLOW UP: No follow-ups on file.  Signed:  Arletha Lady, MD           07/10/2023

## 2023-07-10 NOTE — Addendum Note (Signed)
 Addended by: Shelvia Dick on: 07/10/2023 04:05 PM   Modules accepted: Orders

## 2023-07-12 ENCOUNTER — Other Ambulatory Visit: Payer: Self-pay | Admitting: Nurse Practitioner

## 2023-07-31 DIAGNOSIS — H903 Sensorineural hearing loss, bilateral: Secondary | ICD-10-CM | POA: Diagnosis not present

## 2023-07-31 DIAGNOSIS — H6991 Unspecified Eustachian tube disorder, right ear: Secondary | ICD-10-CM | POA: Diagnosis not present

## 2023-08-22 NOTE — Progress Notes (Signed)
 Remote ICD transmission.

## 2023-09-05 ENCOUNTER — Ambulatory Visit: Payer: Self-pay

## 2023-09-05 NOTE — Telephone Encounter (Signed)
  FYI Only or Action Required?: Action required by provider: update on patient condition.  Patient was last seen in primary care on 07/10/2023 by McGowen, Aleene DEL, MD.  Called Nurse Triage reporting Shortness of Breath.  Symptoms began a week ago.  Interventions attempted: Rest, hydration, or home remedies.  Symptoms are: unchanged.  Triage Disposition: See HCP Within 4 Hours (Or PCP Triage)  Patient/caregiver understands and will follow disposition?: Yes  Copied from CRM [id number]. Topic: Clinical - Red Word Triage >> Sep 05, 2023 12:51 PM Rosaria A wrote: Red Word that prompted transfer to Nurse Triage: Patient states when he walks far his legs give out and he starts breathing rapid. Reason for Disposition  [1] MILD difficulty breathing (e.g., minimal/no SOB at rest, SOB with walking, pulse < 100) AND [2] NEW-onset or WORSE than normal  Answer Assessment - Initial Assessment Questions 1. RESPIRATORY STATUS: Describe your breathing? (e.g., wheezing, shortness of breath, unable to speak, severe coughing)      Shortness of breath with activity States that when he walks far, his legs get tired and it gets harder to breathe 2. ONSET: When did this breathing problem begin?      One week 3. PATTERN Does the difficult breathing come and go, or has it been constant since it started?      constant 4. SEVERITY: How bad is your breathing? (e.g., mild, moderate, severe)      Moderate shortness of breath, resolves with rest 5. RECURRENT SYMPTOM: Have you had difficulty breathing before? If Yes, ask: When was the last time? and What happened that time?      denies 6. CARDIAC HISTORY: Do you have any history of heart disease? (e.g., heart attack, angina, bypass surgery, angioplasty)      Patient has history of open heart surgery  7. LUNG HISTORY: Do you have any history of lung disease?  (e.g., pulmonary embolus, asthma, emphysema)     Denies, not that he is aware 8. CAUSE:  What do you think is causing the breathing problem?      States that he thinks that this is a symptoms of stress.  Wife had recent femur fracture and that his daughter in law is causing problems in the family 27. OTHER SYMPTOMS: Do you have any other symptoms? (e.g., chest pain, cough, dizziness, fever, runny nose)     States that his heart rate increases with his shortness of breath, but denies chest pain or diziness 10. O2 SATURATION MONITOR:  Do you use an oxygen saturation monitor (pulse oximeter) at home? If Yes, ask: What is your reading (oxygen level) today? What is your usual oxygen saturation reading? (e.g., 95%)       Sats are always 97%-100%   Patient states that he only experiences SOB when walking outside.  Will not work/walk outside until seen by PCP Will call EMS for any worsening symptoms  Protocols used: Breathing Difficulty-A-AH

## 2023-09-05 NOTE — Telephone Encounter (Signed)
 Pt scheduled

## 2023-09-06 ENCOUNTER — Ambulatory Visit (HOSPITAL_BASED_OUTPATIENT_CLINIC_OR_DEPARTMENT_OTHER)
Admission: RE | Admit: 2023-09-06 | Discharge: 2023-09-06 | Disposition: A | Discharge status: Home / Self Care | Source: Ambulatory Visit | Attending: Family Medicine | Admitting: Family Medicine

## 2023-09-06 ENCOUNTER — Ambulatory Visit (INDEPENDENT_AMBULATORY_CARE_PROVIDER_SITE_OTHER): Admitting: Family Medicine

## 2023-09-06 ENCOUNTER — Ambulatory Visit: Payer: Self-pay | Admitting: Family Medicine

## 2023-09-06 ENCOUNTER — Encounter: Payer: Self-pay | Admitting: Family Medicine

## 2023-09-06 VITALS — BP 138/88 | HR 74 | Temp 98.3°F | Wt 153.6 lb

## 2023-09-06 DIAGNOSIS — R0609 Other forms of dyspnea: Secondary | ICD-10-CM

## 2023-09-06 DIAGNOSIS — I517 Cardiomegaly: Secondary | ICD-10-CM | POA: Diagnosis not present

## 2023-09-06 DIAGNOSIS — R0602 Shortness of breath: Secondary | ICD-10-CM

## 2023-09-06 DIAGNOSIS — Z951 Presence of aortocoronary bypass graft: Secondary | ICD-10-CM | POA: Diagnosis not present

## 2023-09-06 DIAGNOSIS — I509 Heart failure, unspecified: Secondary | ICD-10-CM

## 2023-09-06 DIAGNOSIS — I5020 Unspecified systolic (congestive) heart failure: Secondary | ICD-10-CM | POA: Diagnosis not present

## 2023-09-06 DIAGNOSIS — R0600 Dyspnea, unspecified: Secondary | ICD-10-CM | POA: Diagnosis not present

## 2023-09-06 LAB — COMPREHENSIVE METABOLIC PANEL WITH GFR
ALT: 15 U/L (ref 0–53)
AST: 23 U/L (ref 0–37)
Albumin: 4.2 g/dL (ref 3.5–5.2)
Alkaline Phosphatase: 76 U/L (ref 39–117)
BUN: 22 mg/dL (ref 6–23)
CO2: 31 meq/L (ref 19–32)
Calcium: 9.7 mg/dL (ref 8.4–10.5)
Chloride: 99 meq/L (ref 96–112)
Creatinine, Ser: 1.14 mg/dL (ref 0.40–1.50)
GFR: 61.67 mL/min (ref >60.00)
Glucose, Bld: 121 mg/dL — ABNORMAL HIGH (ref 70–99)
Potassium: 3.9 meq/L (ref 3.5–5.1)
Sodium: 139 meq/L (ref 135–145)
Total Bilirubin: 1.4 mg/dL — ABNORMAL HIGH (ref 0.2–1.2)
Total Protein: 6.4 g/dL (ref 6.0–8.3)

## 2023-09-06 LAB — CBC WITH DIFFERENTIAL/PLATELET
Basophils Absolute: 0 K/uL (ref 0.0–0.1)
Basophils Relative: 0.7 % (ref 0.0–3.0)
Eosinophils Absolute: 0 K/uL (ref 0.0–0.7)
Eosinophils Relative: 0.4 % (ref 0.0–5.0)
HCT: 51.7 % (ref 39.0–52.0)
Hemoglobin: 17 g/dL (ref 13.0–17.0)
Lymphocytes Relative: 19.2 % (ref 12.0–46.0)
Lymphs Abs: 1 K/uL (ref 0.7–4.0)
MCHC: 32.9 g/dL (ref 30.0–36.0)
MCV: 97 fl (ref 78.0–100.0)
Monocytes Absolute: 0.4 K/uL (ref 0.1–1.0)
Monocytes Relative: 7.3 % (ref 3.0–12.0)
Neutro Abs: 3.8 K/uL (ref 1.4–7.7)
Neutrophils Relative %: 72.4 % (ref 43.0–77.0)
Platelets: 124 K/uL — ABNORMAL LOW (ref 150.0–400.0)
RBC: 5.33 Mil/uL (ref 4.22–5.81)
RDW: 15.4 % (ref 11.5–15.5)
WBC: 5.2 K/uL (ref 4.0–10.5)

## 2023-09-06 LAB — BRAIN NATRIURETIC PEPTIDE: Pro B Natriuretic peptide (BNP): 3886 pg/mL — ABNORMAL HIGH (ref 0.0–100.0)

## 2023-09-06 MED ORDER — POTASSIUM CHLORIDE CRYS ER 20 MEQ PO TBCR: 20.0000 meq | EXTENDED_RELEASE_TABLET | Freq: Two times a day (BID) | ORAL | 0 refills | Status: DC

## 2023-09-06 MED ORDER — FUROSEMIDE 40 MG PO TABS: 40.0000 mg | ORAL_TABLET | Freq: Two times a day (BID) | ORAL | 0 refills | Status: DC

## 2023-09-06 NOTE — Telephone Encounter (Signed)
 Please call [name] did not show signs of infection.  He does appear to have increased in fluid retention.  Increase lasix  to 40 mg BID for 4 days with the potassium supplement.    - This higher dose and potassium called into [name]. Start tomorrow morning. Follow up with McGowen in 1 week to ensure improvement in symptoms . If symptoms so not improve, the he may need to be evaluated by cardio.

## 2023-09-06 NOTE — Progress Notes (Signed)
 Daniel [name] Daniel [name]. , 1945-02-27, 78 y.o., male MRN: [id number] Patient Care Team    Relationship Specialty Notifications Start End  McGowen, Aleene DEL, [name] PCP - General Family Medicine  08/26/22   Daniel [name] PCP - Cardiology Cardiology Admissions 06/27/18   Daniel [name] PCP - Electrophysiology Cardiology  05/28/21   Daniel Reyes BIRCH, [name] Consulting Physician Cardiology  08/16/12   Daniel Arvella CROME, [name] Consulting Physician Urology  11/26/14   Daniel Glance, [name] Consulting Physician Urology  03/29/15   Daniel Elspeth BROCKS, [name] Consulting Physician Cardiothoracic Surgery  06/21/16   Daniel Debby LABOR, [name] (Inactive) Consulting Physician Cardiology  06/21/16   Daniel [name] Consulting Physician Cardiology  12/31/16   Daniel [name] Consulting Physician Cardiology  07/30/17   Daniel [name] Consulting Physician Cardiology  03/19/19   Daniel [name] Consulting Physician Vascular Surgery  03/11/21     Chief Complaint  Patient presents with   Shortness of Breath    A few weeks; SOB/becoming winded easily. Cannot do physical [name] without feeling fatigued.      Subjective: Daniel [name]. is a 78 y.o. Pt presents for an OV with complaints of shortness of breath of greater than 2 weeks duration.   He reports he noticed he feels more winded with activity.  He states he sat down take a break and symptoms were improved within a few minutes.  Denies any chest pain, palpitations or dizziness when he feels dyspnea.  He reports swelling in bilateral lower extremities is common for him, but he may have a little bit more fluid.  He states when he wakes up in the morning he feels like his chest is tight, especially the right side. He denies any fevers, chills, exposures to illness, upper respiratory symptoms or cough. He is a daily smoker, not prescribed inhalers. Patient is prescribed benazepril , Lasix  and baby aspirin . Patient has a significant medical history of tobacco  dependence, prostate cancer, A-fib, history of NSTEMI, history of ischemic cardiomyopathy, defibrillator in place, hypertension, history of CABG, systolic heart failure, history of CVA  Echocardiogram 01/02/2020: IMPRESSIONS   1. Global hypokinesis with akinesis of the inferolateral wall; overall  severe LV dysfunction; no LV thrombus noted using definity .   2. Left ventricular ejection fraction, by estimation, is 20 to 25%. The  left ventricle has severely decreased function. The left ventricle  demonstrates regional wall motion abnormalities (see scoring  diagram/findings for description). The left  ventricular internal cavity size was moderately dilated. Left ventricular  diastolic parameters are indeterminate.   3. Right ventricular systolic function is normal. The right ventricular  size is normal.   4. Left atrial size was mildly dilated.   5. The mitral valve is normal in structure. No evidence of mitral valve  regurgitation. No evidence of mitral stenosis.   6. The aortic valve is tricuspid. Aortic valve regurgitation is mild.  Mild aortic valve sclerosis is present, with no evidence of aortic valve  stenosis.   7. Aortic dilatation noted. There is mild dilatation of the aortic root,  measuring 40 mm.   8. The inferior vena cava is normal in size with greater than 50%  respiratory variability, suggesting right atrial pressure of 3 mmHg.    06/01/2022 AAA ultrasound Summary:  Abdominal Aorta: There is evidence of abnormal dilatation of the distal  Abdominal aorta. The largest aortic diameter remains essentially unchanged  compared to prior exam. Previous  diameter measurement was 4.2 cm obtained  on 05/24/2022.  Dilatation of the right and left common iliac arteries.     05/24/2023   10:35 AM 08/16/2022   10:07 AM 05/17/2022    8:50 AM 02/18/2022    8:55 AM 12/17/2021    1:45 PM  Depression screen PHQ 2/9  Decreased Interest 1 1 0 0 0  Down, Depressed, Hopeless 1 0 0 0 0  PHQ  - 2 Score 2 1 0 0 0  Altered sleeping 0 0     Tired, decreased energy 1 1     Change in appetite 0 1     Feeling bad or failure about yourself  0 0     Trouble concentrating 0 0     Moving slowly or fidgety/restless 0 0     Suicidal thoughts 0 0     PHQ-9 Score 3 3     Difficult doing work/chores Somewhat difficult Not difficult at all       Allergies  Allergen Reactions   Entresto  [Sacubitril -Valsartan ] Hives and Shortness Of Breath   Amlodipine  Swelling    LE swelling   Contrast Media [Iodinated Contrast Media] Other (See Comments)    Prostate problem had to wear a catheter for two weeks after having dye.    Hydralazine  Palpitations and Other (See Comments)    Fatigue+   Social History   Social History Narrative   Married, 2 grown children.   HS education.  Orig from [place], lives there now.   Occupation: maintenance.  Drives a tractor a lot, drives a pickup truck a lot, rides horses a lot.   Tobacco: 20 pack-yr hx (current as of 07/2012)   No drugs.   Alcohol: none   Past Medical History:  Diagnosis Date   Abdominal aortic aneurysm (HCC) 01/2021   4 cm, infrarenal.  Also enlarged left common iliac artery--vascular eval 03/2021->rpt aortic u/s 06/01/22 stable Aorta 4.2 cm.  Bilat common iliac aneurisms and left common femoral aneurism-->f/u 1 yr   Acute prostatitis 06/19/2013   Anxiety    Atrial fibrillation (HCC)    Limited episode, emergency room, June, 2013, spontaneous conversion to sinus rhythm, was evaluated By Dr. Micky 2013- no further visits.  Post-op (CABG) a-fib, converted on amio but pt self d/c'd this med due to DOE/side effect profile.   Blurred vision 01/01/2020   Bradycardia 06/20/2017   CAD (coronary artery disease) 05/2016   a. 05/2016: NSTEMI with cath showing 3-vessel disease. Underwent CABG on 05/12/16 with LIMA-D1, SVG-LAD, and SVG-PDA.    COPD (chronic obstructive pulmonary disease) (HCC) fall 2016   Bullous changes noted on lower lung images of CT  abd done by urology   Diverticulitis    Double vision 01/2020   MG ab w/u NEG.  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 Unable to have MRI due to having shrapnel in forehead.   Hyperlipidemia    statin intolerant. Pt declined advanced lipid clinic referral 08/2019, 03/2020, and 01/2021.   Hypertension    Ischemic cardiomyopathy 05/2016   EF 20-25% at the time of NSTEMI/CABG.  Repeat EF 07/2016 30-35%.  07/2017 EF 25-30%, pt intol of entresto , bidil, and  BB--for ICD 03/2019.   Microscopic hematuria Fall 2016   CT nl except nonobstructing stones.  Cystoscopy normal 12/30/14 (Dr. 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  Urol (Dr. Beola) at [facility]; Dr. Beola referred him to Dr. Renda 03/2015: patient got bilat nerve sparing , robot assisted laparoscopic radical prostatectomy and pelvic lymphadenectomy.  Urol f/u, PSA surveillance showing very mild uptrend of PSA but not to the level of biochemical recurrence as of 05/2021 urology follow-up   RLS (restless legs syndrome)    Tobacco dependence    down to 1-2 cigs per day as of 11/2015.  Restarted 08/2016.   Urinary retention 05/2015   occurred s/p foley removal   UTI (urinary tract infection) 11/14/2014   Past Surgical History:  Procedure Laterality Date   aortic ultrasound  07/2012   2014 NO AAA.  01/2021-->4 cm AAA with R common iliac ectasia-->vasc surg ref   COLONOSCOPY  approx 2011 per pt   Done by surgeon  in [place] after a bout of diverticulitis; normal per pt report--was told to repeat in 10 yrs.   CORONARY ARTERY BYPASS GRAFT N/A 05/12/2016   Procedure: CORONARY ARTERY BYPASS GRAFTING (CABG) TIMES THREE USING LEFT INTERNAL MAMMARY ARTERY AND RIGHT SAPHENOUS LEG VEIN HARVESTED ENDOSCOPICALLY;  Surgeon: Elspeth JAYSON Millers, [name];  Location: [facility] OR;  Service: Open Heart Surgery;  Laterality: N/A;   ICD IMPLANT N/A 04/12/2019   Procedure: ICD IMPLANT;  Surgeon: Daniel [name];  Location: [facility] INVASIVE CV LAB;  Service: Cardiovascular;  Laterality: N/A;   INGUINAL HERNIA REPAIR  2003 and 2004   Both sides.   LAPAROSCOPY  2017   LEFT HEART CATH AND CORONARY ANGIOGRAPHY N/A 05/09/2016   Procedure: Left Heart Cath and Coronary Angiography;  Surgeon: Debby DELENA Sor, [name];  Location: [facility] INVASIVE CV LAB;  Service: Cardiovascular;  Laterality: N/A;   LITHOTRIPSY  2004   Dr. Verdene in [place].   LYMPHADENECTOMY Bilateral 04/30/2015   Procedure: PELVIC LYMPHADENECTOMY;  Surgeon: Gretel Renda, [name];  Location: [facility] ORS;  Service: Urology;  Laterality: Bilateral;   PROSTATE BIOPSY  02/05/2015   Prostate adenocarcinoma: pt considering treatment options as of 02/19/15   ROBOT ASSISTED LAPAROSCOPIC RADICAL PROSTATECTOMY N/A 04/30/2015   Procedure: XI ROBOTIC ASSISTED LAPAROSCOPIC RADICAL PROSTATECTOMY LEVEL 2;  Surgeon: Gretel Renda, [name];  Location: [facility] ORS;  Service: Urology;  Laterality: N/A;   TEE WITHOUT CARDIOVERSION N/A 05/12/2016   Procedure: TRANSESOPHAGEAL ECHOCARDIOGRAM (TEE);  Surgeon: Elspeth JAYSON Millers, [name];  Location: [facility] OR;  Service: Open Heart Surgery;  Laterality: N/A;   TRANSTHORACIC ECHOCARDIOGRAM  03/22/2006; 05/2016; 07/2016; 07/2017; 01/02/20   2008: EF 65%, normal valves, no wall motion abnormalties, LA size normal.  05/2016 in context of non-STEMI, EF 20-25%, mild AV and MV regurg.  08/05/16: EF 30-35%, akinesis of mid lateral myoc, grd II DD, mild aortic 07/2017: EF 25-30%, mult areas of akinesis, grd I  DD. 01/2020 EF 25-30%, sev LV dsfxn, valves fine, aortic root 40mm   Family History  Problem Relation Age of Onset   Hypertension Mother    Cancer - Other Mother 36       breast   Cancer Father        Lung ca age 27  Diabetes Sister    Allergies as of 09/06/2023       Reactions   Entresto  [sacubitril -valsartan ] Hives, Shortness Of Breath   Amlodipine  Swelling   LE swelling   Contrast Media [iodinated Contrast Media] Other (See Comments)   Prostate problem had to wear a catheter for two weeks after having dye.    Hydralazine  Palpitations, Other (See Comments)   Fatigue+        Medication List        Accurate as of September 06, 2023  2:11 PM. If you have any questions, ask your nurse or doctor.          ALPRAZolam  0.25 MG tablet Commonly known as: XANAX  tAKE 1 to 2 tabLETS BY MOUTH AT BEDTIME AS NEEDED FOR insomnia/restless legs   aspirin  EC 81 MG tablet Take 1 tablet (81 mg total) by mouth daily. Swallow whole.   benazepril  20 MG tablet Commonly known as: LOTENSIN  Take 1 tablet (20 mg total) by mouth daily. 1 tab po bid   Farxiga  10 MG Tabs tablet Generic drug: dapagliflozin  propanediol TAKE ONE TABLET BY MOUTH DAILY BEFORE BREAKFAST   Fish Oil 1000 MG Caps Take 1,000 mg by mouth daily.   fluticasone  50 MCG/ACT nasal spray Commonly known as: FLONASE  Place 2 sprays into both nostrils daily.   furosemide  20 MG tablet Commonly known as: LASIX  Take 1 tablet (20 mg total) by mouth 2 (two) times daily. What changed: Another medication with the same name was added. Make sure you understand how and when to take each. Changed by: Charlies Bellini   furosemide  40 MG tablet Commonly known as: LASIX  Take 1 tablet (40 mg total) by mouth 2 (two) times daily for 4 days. What changed: You were already taking a medication with the same name, and this prescription was added. Make sure you understand how and when to take each. Changed by: Charlies Bellini   Lubricant Eye Drops  0.4-0.3 % Soln Generic drug: Polyethyl Glycol-Propyl Glycol Place 1 drop into both eyes 3 (three) times daily as needed (dry/irritated eyes.).   ondansetron  4 MG tablet Commonly known as: Zofran  Take 1 tablet (4 mg total) by mouth every 8 (eight) hours as needed for nausea or vomiting.   potassium chloride  SA 20 MEQ tablet Commonly known as: KLOR-CON  M Take 1 tablet (20 mEq total) by mouth 2 (two) times daily for 4 days. Started by: Charlies Bellini   Vitamin D (Cholecalciferol) 10 MCG (400 UNIT) Caps Take 400 Units by mouth daily.        All past medical history, surgical history, allergies, family history, immunizations andmedications were updated in the EMR today and reviewed under the history and medication portions of their EMR.     ROS Negative, with the exception of above mentioned in HPI   Objective:  BP 138/88   Pulse 74   Temp 98.3 F (36.8 C)   Wt 153 lb 9.6 oz (69.7 kg)   SpO2 97%   BMI 22.04 kg/m  Body mass index is 22.04 kg/m.  Physical Exam Vitals and nursing note reviewed. Exam conducted with a chaperone present.  Constitutional:      General: He is not in acute distress.    Appearance: Normal appearance. He is not ill-appearing, toxic-appearing or diaphoretic.  HENT:     Head: Normocephalic and atraumatic.     Mouth/Throat:     Mouth: Mucous membranes are moist.  Eyes:     General: No scleral icterus.  Right eye: No discharge.        Left eye: No discharge.     Extraocular Movements: Extraocular movements intact.     Pupils: Pupils are equal, round, and reactive to light.  Cardiovascular:     Rate and Rhythm: Normal rate and regular rhythm.     Heart sounds: No murmur heard. Pulmonary:     Effort: Pulmonary effort is normal. No respiratory distress.     Breath sounds: Rales (RLL) present. No wheezing or rhonchi.  Musculoskeletal:     Cervical back: Neck supple.     Right lower leg: Edema present.     Left lower leg: Edema present.      Comments: +2 edema bilateral lower shin to ankle  Skin:    General: Skin is warm.     Findings: No rash.  Neurological:     Mental Status: He is alert and oriented to person, place, and time. Mental status is at baseline.  Psychiatric:        Mood and Affect: Mood normal.        Behavior: Behavior normal.        Thought Content: Thought content normal.        Judgment: Judgment normal.      No results found. DG Chest 2 View Result Date: 09/06/2023 CLINICAL DATA:  Dyspnea. EXAM: CHEST - 2 VIEW COMPARISON:  August 26, 2022. FINDINGS: Stable cardiomegaly. Left-sided defibrillator is unchanged. Status post coronary artery bypass graft. No acute pulmonary disease is noted. Bony thorax is unremarkable. IMPRESSION: No active cardiopulmonary disease. Electronically Signed   By: Daniel Landy Raddle M.D.   On: 09/06/2023 13:43   No results found for this or any previous visit (from the past 24 hours).  Assessment/Plan: Rawleigh Rode. is a 78 y.o. male present for OV for  Shortness of breath/Dyspnea on exertion (Primary)/HFrEF (heart failure with reduced ejection fraction) (HCC)/Acute on chronic congestive heart failure, unspecified heart failure type (HCC) VSS. Oxygen sat 97% +2 b/l edema present with 8 lb weight gain in 6 weeks.  -CBC, CMP, BNP collected today - suspect acute on chronic chf as cause of symptoms > pt sent for CXR STAT. - will increase lasix  dose for 3 days and have him follow up with PCP next week  - if cxr appears infectious doxy bid will be prescribed  Reviewed expectations re: course of current medical issues. Discussed self-management of symptoms. Outlined signs and symptoms indicating need for more acute intervention. Patient verbalized understanding and all questions were answered. Patient received an After-Visit Summary.    Orders Placed This Encounter  Procedures   DG Chest 2 View   CBC w/Diff   Comp Met (CMET)   B Nat Peptide   Meds ordered this encounter   Medications   furosemide  (LASIX ) 40 MG tablet    Sig: Take 1 tablet (40 mg total) by mouth 2 (two) times daily for 4 days.    Dispense:  8 tablet    Refill:  0   potassium chloride  SA (KLOR-CON  M) 20 MEQ tablet    Sig: Take 1 tablet (20 mEq total) by mouth 2 (two) times daily for 4 days.    Dispense:  8 tablet    Refill:  0   Referral Orders  No referral(s) requested today     Note is dictated utilizing voice recognition software. Although note has been proof read prior to signing, occasional typographical errors still can be missed. If any questions arise,  please do not hesitate to call for verification.   electronically signed by:  Charlies Bellini, DO  [facility] - [place]

## 2023-09-06 NOTE — Progress Notes (Signed)
 Results and recommendations given

## 2023-09-07 NOTE — Telephone Encounter (Signed)
 Please call patient His blood work suggests fluid overload, congestive heart failure, as the cause of his symptoms.  The BNP is over 3000, in the past when he has had fluid load his BNP was around 1500. Take the Lasix  40 mg twice a day for the 4 days as we discussed his last telephone note.  Please make sure he did receive that telephone note yesterday with his chest x-ray results. Please make sure he has follow-up with his PCP early next week. If symptoms are worsening over the next couple days,-shortness of breath, chest discomfort or palpitations, then plan for recommend he goes to the emergency room

## 2023-09-13 ENCOUNTER — Encounter: Payer: Self-pay | Admitting: Family Medicine

## 2023-09-13 ENCOUNTER — Ambulatory Visit: Admitting: Family Medicine

## 2023-09-13 VITALS — BP 136/86 | HR 77 | Temp 98.4°F | Wt 144.0 lb

## 2023-09-13 DIAGNOSIS — R0609 Other forms of dyspnea: Secondary | ICD-10-CM | POA: Diagnosis not present

## 2023-09-13 DIAGNOSIS — I5020 Unspecified systolic (congestive) heart failure: Secondary | ICD-10-CM | POA: Diagnosis not present

## 2023-09-13 DIAGNOSIS — R0602 Shortness of breath: Secondary | ICD-10-CM | POA: Diagnosis not present

## 2023-09-13 DIAGNOSIS — I509 Heart failure, unspecified: Secondary | ICD-10-CM

## 2023-09-13 LAB — BASIC METABOLIC PANEL WITH GFR
BUN: 23 mg/dL (ref 6–23)
CO2: 31 meq/L (ref 19–32)
Calcium: 9.8 mg/dL (ref 8.4–10.5)
Chloride: 98 meq/L (ref 96–112)
Creatinine, Ser: 1.19 mg/dL (ref 0.40–1.50)
GFR: 58.57 mL/min — ABNORMAL LOW (ref >60.00)
Glucose, Bld: 115 mg/dL — ABNORMAL HIGH (ref 70–99)
Potassium: 4.4 meq/L (ref 3.5–5.1)
Sodium: 137 meq/L (ref 135–145)

## 2023-09-13 LAB — BRAIN NATRIURETIC PEPTIDE: Pro B Natriuretic peptide (BNP): 2021 pg/mL — ABNORMAL HIGH (ref 0.0–100.0)

## 2023-09-13 NOTE — Progress Notes (Signed)
 Daniel [name]. , [birth year], 78 y.o., male MRN: [id number] Patient Care Team    Relationship Specialty Notifications Start End  McGowen, Aleene DEL, MD PCP - General Family Medicine  08/26/22   Lavona Lynwood, MD PCP - Cardiology Cardiology Admissions 06/27/18   Waddell Danelle ORN, MD PCP - Electrophysiology Cardiology  05/28/21   Micky Reyes BIRCH, MD Consulting Physician Cardiology  08/16/12   Beola Arvella CROME, MD Consulting Physician Urology  11/26/14   Renda Glance, MD Consulting Physician Urology  03/29/15   Kerrin Elspeth BROCKS, MD Consulting Physician Cardiothoracic Surgery  06/21/16   Burnard Debby LABOR, MD (Inactive) Consulting Physician Cardiology  06/21/16   Janene Boer, [place] Consulting Physician Cardiology  12/31/16   Lavona Lynwood, MD Consulting Physician Cardiology  07/30/17   Waddell Danelle ORN, MD Consulting Physician Cardiology  03/19/19   Sheree Penne Bruckner, MD Consulting Physician Vascular Surgery  03/11/21     Chief Complaint  Patient presents with   Shortness of Breath    1 wk f/u.      Subjective: Daniel Radin. is a 78 y.o. Pt presents for follow up on fluid overload and dyspnea.  Patient was seen 09/06/2023 by this provider and found to be fluid overloaded with a BNP over 3000.  Patient's Lasix  was doubled for 4 days with potassium supplement.  Chest x-ray had evidence of COPD and possible fluid overload.  Patient responded well to Lasix  and states he feels much better today.  Breathing is back to normal. 153.6 09/06/2023> increase Lasix  x 4 days >144 pounds today. He reports he has his cardiology appointment coming up he thinks next month.  Prior note: an OV with complaints of shortness of breath of greater than 2 weeks duration.   He reports he noticed he feels more winded with activity.  He states he sat down take a break and symptoms were improved within a few minutes.  Denies any chest pain, palpitations or dizziness when he feels dyspnea.  He reports swelling in  bilateral lower extremities is common for him, but he may have a little bit more fluid.  He states when he wakes up in the morning he feels like his chest is tight, especially the right side. He denies any fevers, chills, exposures to illness, upper respiratory symptoms or cough. He is a daily smoker, not prescribed inhalers. Patient is prescribed benazepril , Lasix  and baby aspirin . Patient has a significant medical history of tobacco dependence, prostate cancer, A-fib, history of NSTEMI, history of ischemic cardiomyopathy, defibrillator in place, hypertension, history of CABG, systolic heart failure, history of CVA  Echocardiogram 01/02/2020: IMPRESSIONS   1. Global hypokinesis with akinesis of the inferolateral wall; overall  severe LV dysfunction; no LV thrombus noted using definity .   2. Left ventricular ejection fraction, by estimation, is 20 to 25%. The  left ventricle has severely decreased function. The left ventricle  demonstrates regional wall motion abnormalities (see scoring  diagram/findings for description). The left  ventricular internal cavity size was moderately dilated. Left ventricular  diastolic parameters are indeterminate.   3. Right ventricular systolic function is normal. The right ventricular  size is normal.   4. Left atrial size was mildly dilated.   5. The mitral valve is normal in structure. No evidence of mitral valve  regurgitation. No evidence of mitral stenosis.   6. The aortic valve is tricuspid. Aortic valve regurgitation is mild.  Mild aortic valve sclerosis is present, with no evidence  of aortic valve  stenosis.   7. Aortic dilatation noted. There is mild dilatation of the aortic root,  measuring 40 mm.   8. The inferior vena cava is normal in size with greater than 50%  respiratory variability, suggesting right atrial pressure of 3 mmHg.    06/01/2022 AAA ultrasound Summary:  Abdominal Aorta: There is evidence of abnormal dilatation of the distal   Abdominal aorta. The largest aortic diameter remains essentially unchanged  compared to prior exam. Previous diameter measurement was 4.2 cm obtained  on 05/24/2022.  Dilatation of the right and left common iliac arteries.     05/24/2023   10:35 AM 08/16/2022   10:07 AM 05/17/2022    8:50 AM 02/18/2022    8:55 AM 12/17/2021    1:45 PM  Depression screen PHQ 2/9  Decreased Interest 1 1 0 0 0  Down, Depressed, Hopeless 1 0 0 0 0  PHQ - 2 Score 2 1 0 0 0  Altered sleeping 0 0     Tired, decreased energy 1 1     Change in appetite 0 1     Feeling bad or failure about yourself  0 0     Trouble concentrating 0 0     Moving slowly or fidgety/restless 0 0     Suicidal thoughts 0 0     PHQ-9 Score 3 3     Difficult doing work/chores Somewhat difficult Not difficult at all       Allergies  Allergen Reactions   Entresto  [Sacubitril -Valsartan ] Hives and Shortness Of Breath   Amlodipine  Swelling    LE swelling   Contrast Media [Iodinated Contrast Media] Other (See Comments)    Prostate problem had to wear a catheter for two weeks after having dye.    Hydralazine  Palpitations and Other (See Comments)    Fatigue+   Social History   Social History Narrative   Married, 2 grown children.   HS education.  Orig from [place], lives there now.   Occupation: maintenance.  Drives a tractor a lot, drives a pickup truck a lot, rides horses a lot.   Tobacco: 20 pack-yr hx (current as of 07/2012)   No drugs.   Alcohol: none   Past Medical History:  Diagnosis Date   Abdominal aortic aneurysm (HCC) 01/2021   4 cm, infrarenal.  Also enlarged left common iliac artery--vascular eval 03/2021->rpt aortic u/s 06/01/22 stable Aorta 4.2 cm.  Bilat common iliac aneurisms and left common femoral aneurism-->f/u 1 yr   Acute prostatitis 06/19/2013   Anxiety    Atrial fibrillation (HCC)    Limited episode, emergency room, June, 2013, spontaneous conversion to sinus rhythm, was evaluated By Dr. Micky 2013- no  further visits.  Post-op (CABG) a-fib, converted on amio but pt self d/c'd this med due to DOE/side effect profile.   Blurred vision 01/01/2020   Bradycardia 06/20/2017   CAD (coronary artery disease) 05/2016   a. 05/2016: NSTEMI with cath showing 3-vessel disease. Underwent CABG on 05/12/16 with LIMA-D1, SVG-LAD, and SVG-PDA.    COPD (chronic obstructive pulmonary disease) (HCC) fall 2016   Bullous changes noted on lower lung images of CT abd done by urology   Diverticulitis    Double vision 01/2020   MG ab w/u NEG.  +4th nerve palsy/mid brain CVA, small vessels dz->DAPT   GERD (gastroesophageal reflux disease)    Has received pneumococcal vaccination    History of CVA (cerebrovascular accident) without residual deficits 01/2020   L midbrain, with  mild L 4th CN palsy-->small vessel dz->DAPT x 3 wks then ASA alone (pt's compliance with ASA PRIOR to CVA was questionable). Unable to have MRI due to having shrapnel in forehead.   Hyperlipidemia    statin intolerant. Pt declined advanced lipid clinic referral 08/2019, 03/2020, and 01/2021.   Hypertension    Ischemic cardiomyopathy 05/2016   EF 20-25% at the time of NSTEMI/CABG.  Repeat EF 07/2016 30-35%.  07/2017 EF 25-30%, pt intol of entresto , bidil, and BB--for ICD 03/2019.   Microscopic hematuria Fall 2016   CT nl except nonobstructing stones.  Cystoscopy normal 12/30/14 (Dr. 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  Urol (Dr. Beola) at [facility]; Dr. Beola referred him to Dr. Renda 03/2015: patient got bilat nerve sparing , robot assisted laparoscopic radical prostatectomy and pelvic lymphadenectomy.  Urol f/u, PSA surveillance showing very mild uptrend of PSA but not to the level of biochemical recurrence as of 05/2021 urology  follow-up   RLS (restless legs syndrome)    Tobacco dependence    down to 1-2 cigs per day as of 11/2015.  Restarted 08/2016.   Urinary retention 05/2015   occurred s/p foley removal   UTI (urinary tract infection) 11/14/2014   Past Surgical History:  Procedure Laterality Date   aortic ultrasound  07/2012   2014 NO AAA.  01/2021-->4 cm AAA with R common iliac ectasia-->vasc surg ref   COLONOSCOPY  approx 2011 per pt   Done by surgeon in [place] after a bout of diverticulitis; normal per pt report--was told to repeat in 10 yrs.   CORONARY ARTERY BYPASS GRAFT N/A 05/12/2016   Procedure: CORONARY ARTERY BYPASS GRAFTING (CABG) TIMES THREE USING LEFT INTERNAL MAMMARY ARTERY AND RIGHT SAPHENOUS LEG VEIN HARVESTED ENDOSCOPICALLY;  Surgeon: Elspeth JAYSON Millers, MD;  Location[facility] OR;  Service: Open Heart Surgery;  Laterality: N/A;   ICD IMPLANT N/A 04/12/2019   Procedure: ICD IMPLANT;  Surgeon: Waddell Danelle ORN, MD;  Location: [facility] INVASIVE CV LAB;  Service: Cardiovascular;  Laterality: N/A;   INGUINAL HERNIA REPAIR  2003 and 2004   Both sides.   LAPAROSCOPY  2017   LEFT HEART CATH AND CORONARY ANGIOGRAPHY N/A 05/09/2016   Procedure: Left Heart Cath and Coronary Angiography;  Surgeon: Debby DELENA Sor, MD;  Location: [facility] INVASIVE CV LAB;  Service: Cardiovascular;  Laterality: N/A;   LITHOTRIPSY  2004   Dr. Verdene in [place].   LYMPHADENECTOMY Bilateral 04/30/2015   Procedure: PELVIC LYMPHADENECTOMY;  Surgeon: Gretel Renda, MD;  Location: [facility] ORS;  Service: Urology;  Laterality: Bilateral;   PROSTATE BIOPSY  02/05/2015   Prostate adenocarcinoma: pt considering treatment options as of 02/19/15   ROBOT ASSISTED LAPAROSCOPIC RADICAL PROSTATECTOMY N/A 04/30/2015   Procedure: XI ROBOTIC ASSISTED LAPAROSCOPIC RADICAL PROSTATECTOMY LEVEL 2;  Surgeon: Gretel Renda, MD;  Location: [facility] ORS;  Service: Urology;  Laterality: N/A;   TEE WITHOUT CARDIOVERSION N/A 05/12/2016   Procedure: TRANSESOPHAGEAL ECHOCARDIOGRAM  (TEE);  Surgeon: Elspeth JAYSON Millers, MD;  Location: [facility] OR;  Service: Open Heart Surgery;  Laterality: N/A;   TRANSTHORACIC ECHOCARDIOGRAM  03/22/2006; 05/2016; 07/2016; 07/2017; 01/02/20   2008: EF 65%, normal valves, no wall motion abnormalties, LA size normal.  05/2016 in context of non-STEMI, EF 20-25%, mild AV  and MV regurg.  08/05/16: EF 30-35%, akinesis of mid lateral myoc, grd II DD, mild aortic 07/2017: EF 25-30%, mult areas of akinesis, grd I DD. 01/2020 EF 25-30%, sev LV dsfxn, valves fine, aortic root 40mm   Family History  Problem Relation Age of Onset   Hypertension Mother    Cancer - Other Mother 31       breast   Cancer Father        Lung ca age 71   Diabetes Sister    Allergies as of 09/13/2023       Reactions   Entresto  [sacubitril -valsartan ] Hives, Shortness Of Breath   Amlodipine  Swelling   LE swelling   Contrast Media [iodinated Contrast Media] Other (See Comments)   Prostate problem had to wear a catheter for two weeks after having dye.    Hydralazine  Palpitations, Other (See Comments)   Fatigue+        Medication List        Accurate as of September 13, 2023  9:40 AM. If you have any questions, ask your nurse or doctor.          ALPRAZolam  0.25 MG tablet Commonly known as: XANAX  tAKE 1 to 2 tabLETS BY MOUTH AT BEDTIME AS NEEDED FOR insomnia/restless legs   aspirin  EC 81 MG tablet Take 1 tablet (81 mg total) by mouth daily. Swallow whole.   benazepril  20 MG tablet Commonly known as: LOTENSIN  Take 1 tablet (20 mg total) by mouth daily. 1 tab po bid   Farxiga  10 MG Tabs tablet Generic drug: dapagliflozin  propanediol TAKE ONE TABLET BY MOUTH DAILY BEFORE BREAKFAST   Fish Oil 1000 MG Caps Take 1,000 mg by mouth daily.   fluticasone  50 MCG/ACT nasal spray Commonly known as: FLONASE  Place 2 sprays into both nostrils daily.   furosemide  20 MG tablet Commonly known as: LASIX  Take 1 tablet (20 mg total) by mouth 2 (two) times daily.   furosemide  40 MG  tablet Commonly known as: LASIX  Take 1 tablet (40 mg total) by mouth 2 (two) times daily for 4 days.   Lubricant Eye Drops 0.4-0.3 % Soln Generic drug: Polyethyl Glycol-Propyl Glycol Place 1 drop into both eyes 3 (three) times daily as needed (dry/irritated eyes.).   ondansetron  4 MG tablet Commonly known as: Zofran  Take 1 tablet (4 mg total) by mouth every 8 (eight) hours as needed for nausea or vomiting.   potassium chloride  SA 20 MEQ tablet Commonly known as: KLOR-CON  M Take 1 tablet (20 mEq total) by mouth 2 (two) times daily for 4 days.   Vitamin D (Cholecalciferol) 10 MCG (400 UNIT) Caps Take 400 Units by mouth daily.        All past medical history, surgical history, allergies, family history, immunizations andmedications were updated in the EMR today and reviewed under the history and medication portions of their EMR.     ROS Negative, with the exception of above mentioned in HPI   Objective:  BP 136/86   Pulse 77   Temp 98.4 F (36.9 C)   Wt 144 lb (65.3 kg)   SpO2 99%   BMI 20.66 kg/m  Body mass index is 20.66 kg/m.  Physical Exam Vitals and nursing note reviewed. Exam conducted with a chaperone present.  Constitutional:      General: He is not in acute distress.    Appearance: Normal appearance. He is not ill-appearing, toxic-appearing or diaphoretic.  HENT:     Head: Normocephalic and atraumatic.  Eyes:  General: No scleral icterus.       Right eye: No discharge.        Left eye: No discharge.     Extraocular Movements: Extraocular movements intact.     Pupils: Pupils are equal, round, and reactive to light.  Cardiovascular:     Rate and Rhythm: Normal rate and regular rhythm.  Pulmonary:     Effort: Pulmonary effort is normal. No respiratory distress.     Breath sounds: Normal breath sounds. No wheezing, rhonchi or rales.  Musculoskeletal:     Right lower leg: Edema present.     Left lower leg: Edema present.     Comments: Trace edema  bilateral lower extremity  Skin:    General: Skin is warm.     Findings: No rash.  Neurological:     Mental Status: He is alert and oriented to person, place, and time. Mental status is at baseline.  Psychiatric:        Mood and Affect: Mood normal.        Behavior: Behavior normal.        Thought Content: Thought content normal.        Judgment: Judgment normal.      No results found. No results found.  No results found for this or any previous visit (from the past 24 hours).  Assessment/Plan: Daniel Mroczka. is a 78 y.o. male present for OV for  Shortness of breath/Dyspnea on exertion (Primary)/HFrEF (heart failure with reduced ejection fraction) (HCC)/Acute on chronic congestive heart failure, unspecified heart failure type [facility]) Patient diuresed nicely, with an almost 9 pound weight loss. Repeat BNP today (was 3300) and BMP. Patient is now taking his normal Lasix  dose of 20 mg twice daily.  We discussed daily weights in the morning/dry weight.  If he appreciates greater than 4 pounds weight gain in a 24-hour.  He is to take an extra Lasix  a day.  He reports understanding Follow-up with PCP in 2-4 weeks if fluid overload returns quickly.  Reviewed expectations re: course of current medical issues. Discussed self-management of symptoms. Outlined signs and symptoms indicating need for more acute intervention. Patient verbalized understanding and all questions were answered. Patient received an After-Visit Summary.    Orders Placed This Encounter  Procedures   B Nat Peptide   Basic Metabolic Panel (BMET)   No orders of the defined types were placed in this encounter.  Referral Orders  No referral(s) requested today     Note is dictated utilizing voice recognition software. Although note has been proof read prior to signing, occasional typographical errors still can be missed. If any questions arise, please do not hesitate to call for verification.   electronically  signed by:  Charlies Bellini, DO  [facility] - OR

## 2023-09-13 NOTE — Patient Instructions (Addendum)
 Return in about 2 weeks (around 09/27/2023) for w/ pcp or cardiologist.        [place] to see you today.  I have refilled the medication(s) we provide.   If labs were collected or images ordered, we will inform you of  results once we have received them and reviewed. We will contact you either by echart message, or telephone call.  Please give ample time to the testing facility, and our office to run,  receive and review results. Please do not call inquiring of results, even if you can see them in your chart. We will contact you as soon as we are able. If it has been over 1 week since the test was completed, and you have not yet heard from us , then please call us .    - echart message- for normal results that have been seen by the patient already.   - telephone call: abnormal results or if patient has not viewed results in their echart.  If a referral to a specialist was entered for you, please call us  in 2 weeks if you have not heard from the specialist office to schedule.

## 2023-09-14 ENCOUNTER — Ambulatory Visit: Payer: Self-pay | Admitting: Family Medicine

## 2023-09-23 IMAGING — US US AORTA
1 series · 13 of 25 positions shown · non-contrast
Comparison: CT abdomen and pelvis 05/07/2016

CLINICAL DATA: Pulsatile abdominal aorta. History of CAD (post
myocardial infarction and CABG), hypertension, stroke hyperlipidemia
and smoking.

EXAM:
ULTRASOUND OF ABDOMINAL AORTA
TECHNIQUE: Ultrasound examination of the abdominal aorta and proximal common
iliac arteries was performed to evaluate for aneurysm. Additional
color and Doppler images of the distal aorta were obtained to
document patency.

[Series 1: us aorta · 0.20mm/px · 13 of 34 slices shown]
[im 1/34]
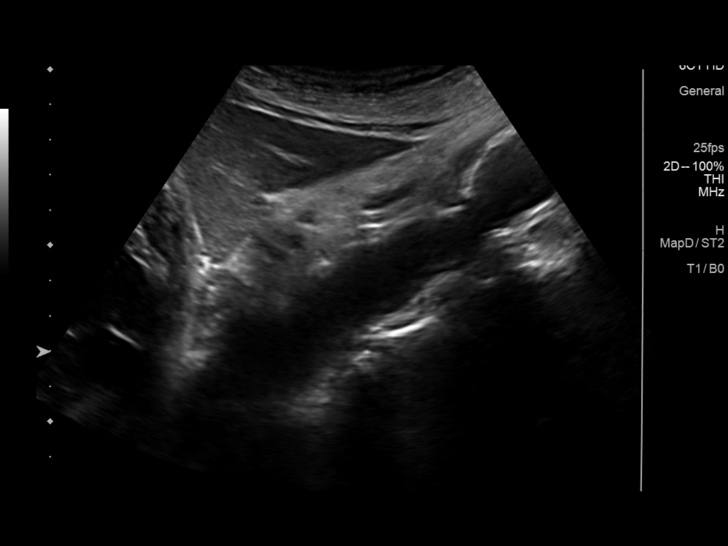
[im 3/34]
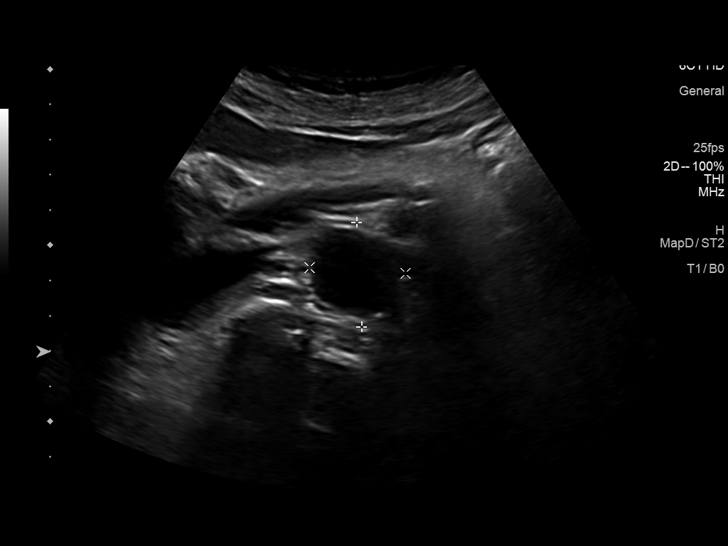
[im 6/34]
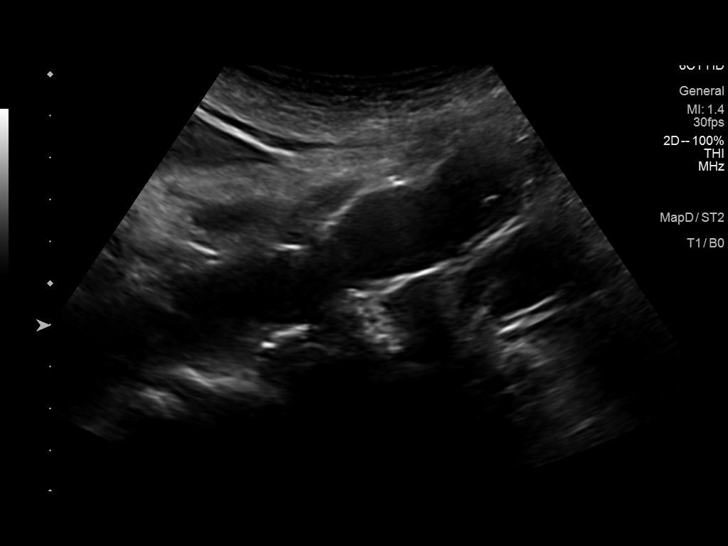
[im 9/34]
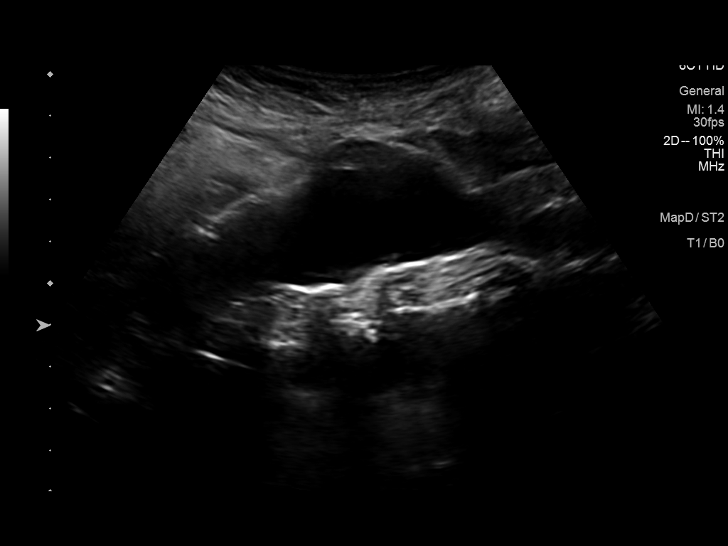
[im 12/34]
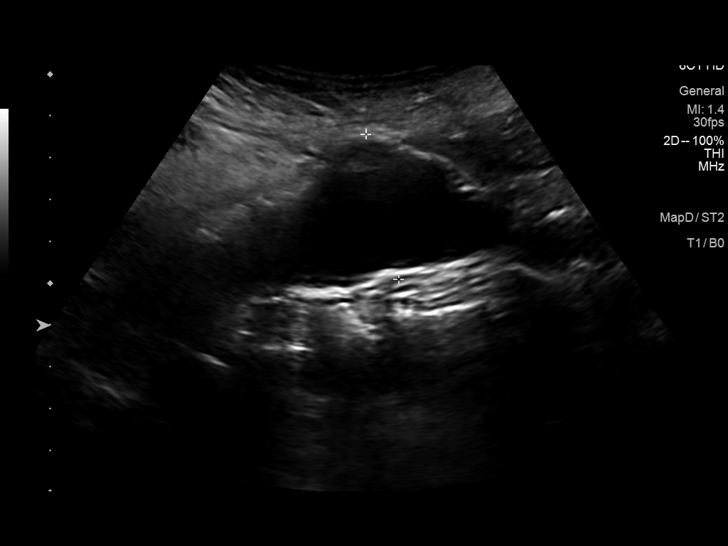
[im 14/34]
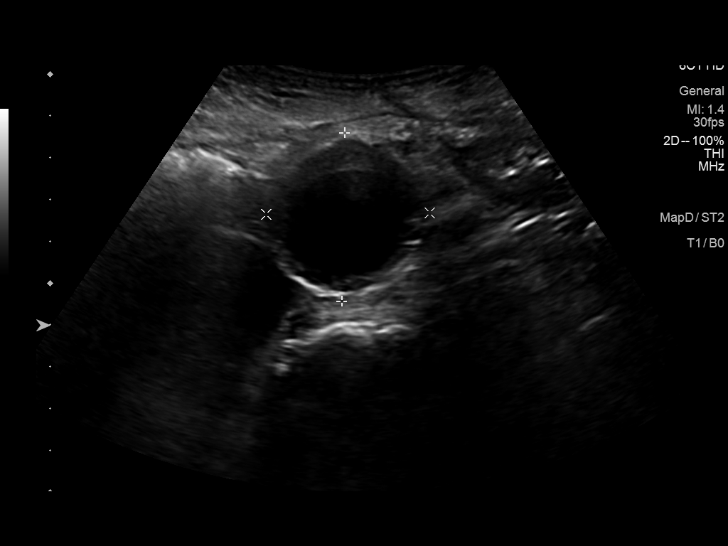
[im 17/34]
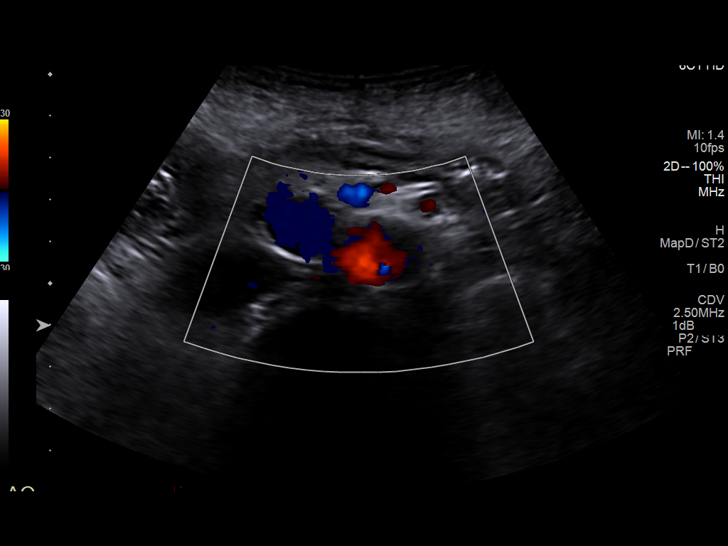
[im 20/34]
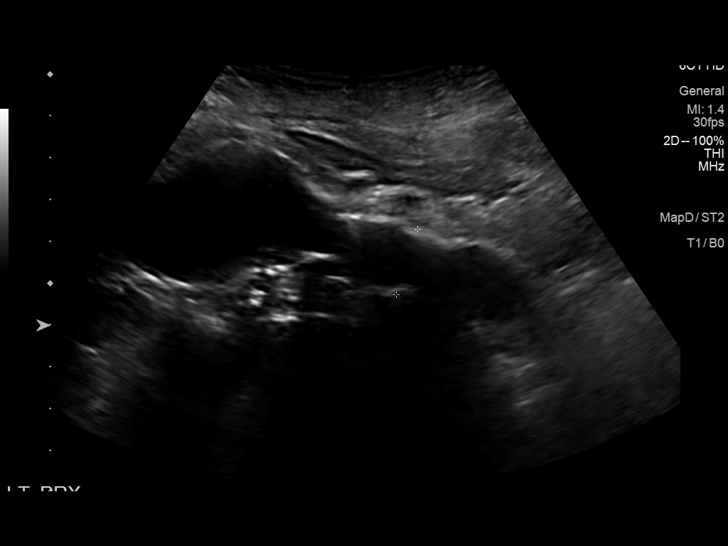
[im 23/34]
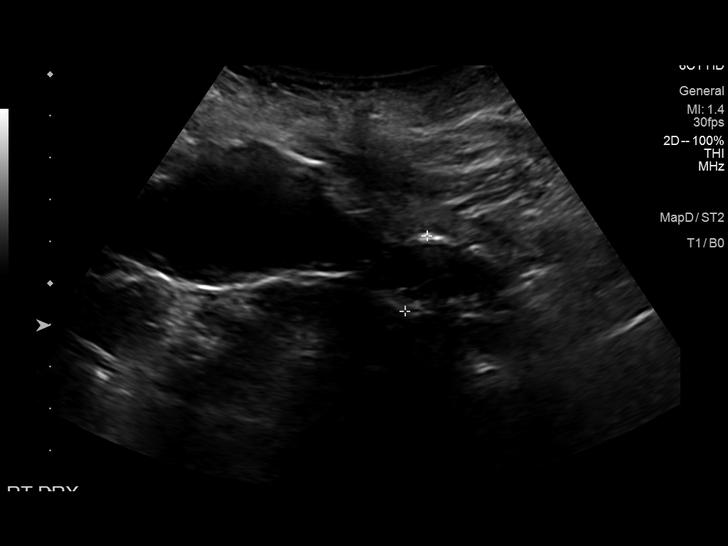
[im 25/34]
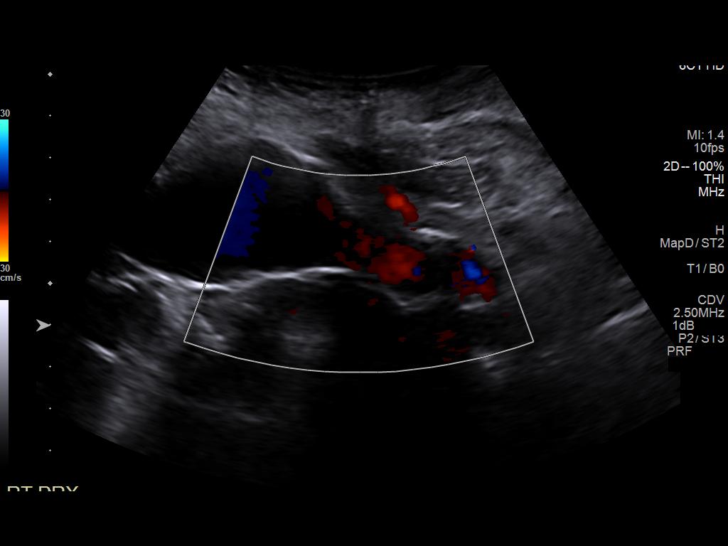
[im 28/34]
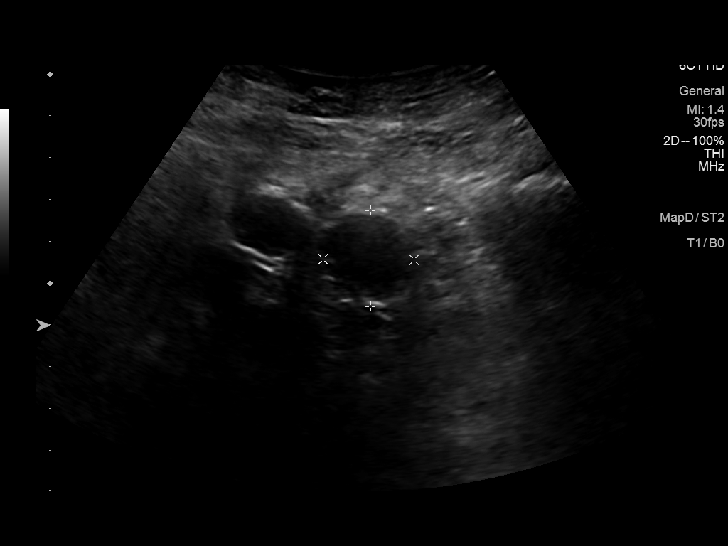
[im 31/34]
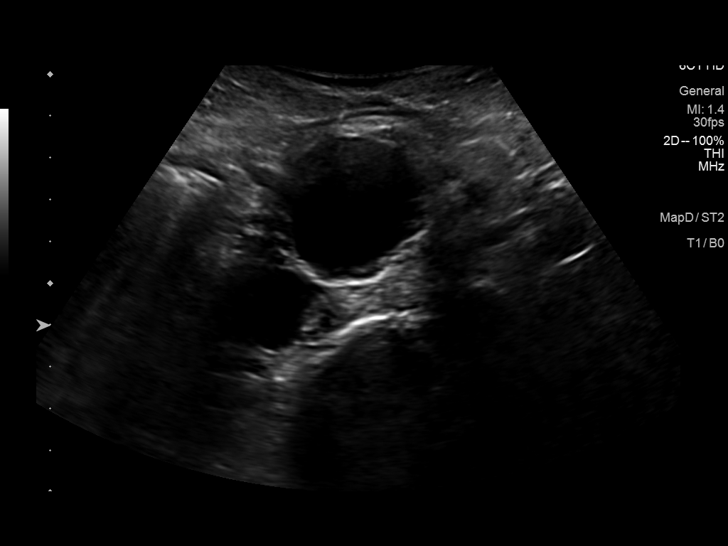
[im 34/34]
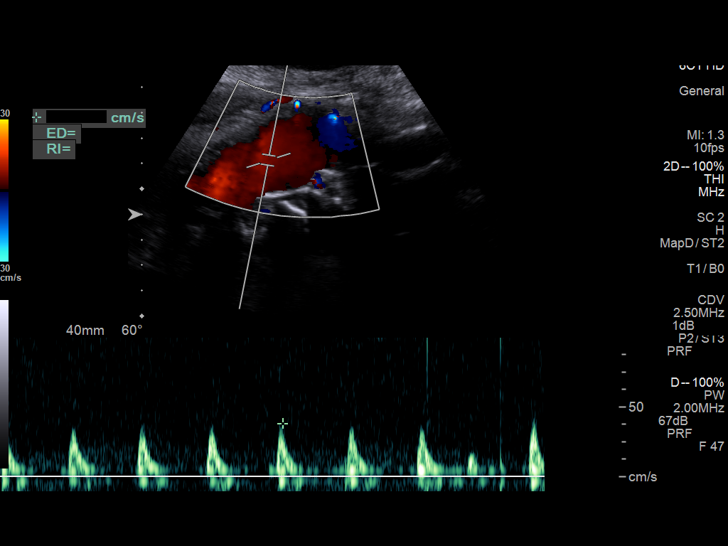

[13 of 25 positions shown; findings below may reference images not displayed]

FINDINGS: Abdominal aortic measurements as follows:

Proximal:  2.7 x 2.4 cm

Mid:  2.6 x 2.2 cm

Distal:  4.0 x 3.9 cm
Patent: Yes, peak systolic velocity is 38 cm/s

Right common iliac artery: 1.9 x 1.8 cm

Left common iliac artery: 2.3 x 2.2 cm

Scattered mixed echogenic plaque is seen throughout the abdominal
aorta. There is a minimal amount of hypoechoic thrombus within the
distal aspect of the abdominal aorta (image 11) and 16).
IMPRESSION: 1. Interval increase in size of infrarenal abdominal aortic aneurysm
now measuring 4 cm in maximum diameter, previously, 2.9 cm (when
compared to abdominal CT performed [DATE]). Aortic aneurysm NOS
([id number]).
2. Ectatic/borderline aneurysmal left common iliac artery measuring
2.3 cm diameter.
3. Further evaluation of both entities can be performed with CTA of
the abdomen and pelvis as indicated.

## 2023-09-25 ENCOUNTER — Telehealth: Payer: Self-pay

## 2023-09-25 NOTE — Telephone Encounter (Signed)
 Copied from CRM #[id number]. Topic: Clinical - Request for Lab/Test Order >> Sep 21, 2023  9:31 AM Viola F wrote: Reason for CRM: Patient needs order put in for echo to the [place] office, was told by Dr. Keneff that he needs a follow up echo - would like it done late in the day   Please confirm if pt due for repeat ECHO

## 2023-10-06 ENCOUNTER — Ambulatory Visit: Payer: Medicare Other

## 2023-10-06 DIAGNOSIS — I255 Ischemic cardiomyopathy: Secondary | ICD-10-CM | POA: Diagnosis not present

## 2023-10-07 LAB — CUP PACEART REMOTE DEVICE CHECK
Battery Remaining Longevity: 41 mo
Battery Remaining Percentage: 42 %
Battery Voltage: 2.93 V
Brady Statistic AP VP Percent: 64 %
Brady Statistic AP VS Percent: 26 %
Brady Statistic AS VP Percent: 2.9 %
Brady Statistic AS VS Percent: 4.8 %
Brady Statistic RA Percent Paced: 88 %
Brady Statistic RV Percent Paced: 67 %
Date Time Interrogation Session: 20250905020101
HighPow Impedance: 59 Ohm
Implantable Lead Connection Status: 753985
Implantable Lead Connection Status: 753985
Implantable Lead Implant Date: 20210312
Implantable Lead Implant Date: 20210312
Implantable Lead Location: 753859
Implantable Lead Location: 753860
Implantable Pulse Generator Implant Date: 20210312
Lead Channel Impedance Value: 380 Ohm
Lead Channel Impedance Value: 410 Ohm
Lead Channel Pacing Threshold Amplitude: 0.5 V
Lead Channel Pacing Threshold Amplitude: 1.125 V
Lead Channel Pacing Threshold Pulse Width: 0.5 ms
Lead Channel Pacing Threshold Pulse Width: 0.5 ms
Lead Channel Sensing Intrinsic Amplitude: 12 mV
Lead Channel Sensing Intrinsic Amplitude: 3.3 mV
Lead Channel Setting Pacing Amplitude: 1.375
Lead Channel Setting Pacing Amplitude: 1.5 V
Lead Channel Setting Pacing Pulse Width: 0.5 ms
Lead Channel Setting Sensing Sensitivity: 0.5 mV
Pulse Gen Serial Number: 111016652
Zone Setting Status: 755011

## 2023-10-08 ENCOUNTER — Ambulatory Visit: Payer: Self-pay | Admitting: Internal Medicine

## 2023-10-14 NOTE — Progress Notes (Signed)
Remote ICD Transmission.

## 2023-10-18 ENCOUNTER — Other Ambulatory Visit: Payer: Self-pay | Admitting: Family Medicine

## 2023-10-18 NOTE — Telephone Encounter (Signed)
 Requesting: alprazolam  Contract: 11/06/21 UDS: N/A Last Visit: 09/13/23 Next Visit: 10/24/23 Last Refill: 06/28/23 (60,1)  Please Advise. Rx pending

## 2023-10-23 ENCOUNTER — Other Ambulatory Visit: Payer: Self-pay

## 2023-10-23 ENCOUNTER — Emergency Department (HOSPITAL_COMMUNITY)

## 2023-10-23 ENCOUNTER — Encounter (HOSPITAL_COMMUNITY): Payer: Self-pay

## 2023-10-23 ENCOUNTER — Inpatient Hospital Stay (HOSPITAL_COMMUNITY)

## 2023-10-23 ENCOUNTER — Inpatient Hospital Stay (HOSPITAL_COMMUNITY)
Admission: EM | Admit: 2023-10-23 | Discharge: 2023-10-25 | DRG: 291 | Disposition: A | Discharge status: Home / Self Care | Attending: Internal Medicine | Admitting: Internal Medicine

## 2023-10-23 DIAGNOSIS — I509 Heart failure, unspecified: Secondary | ICD-10-CM

## 2023-10-23 DIAGNOSIS — I5023 Acute on chronic systolic (congestive) heart failure: Secondary | ICD-10-CM | POA: Diagnosis not present

## 2023-10-23 DIAGNOSIS — R7989 Other specified abnormal findings of blood chemistry: Secondary | ICD-10-CM | POA: Diagnosis not present

## 2023-10-23 DIAGNOSIS — Z833 Family history of diabetes mellitus: Secondary | ICD-10-CM | POA: Diagnosis not present

## 2023-10-23 DIAGNOSIS — R062 Wheezing: Secondary | ICD-10-CM

## 2023-10-23 DIAGNOSIS — J441 Chronic obstructive pulmonary disease with (acute) exacerbation: Secondary | ICD-10-CM | POA: Diagnosis present

## 2023-10-23 DIAGNOSIS — R0789 Other chest pain: Secondary | ICD-10-CM | POA: Diagnosis not present

## 2023-10-23 DIAGNOSIS — T502X5A Adverse effect of carbonic-anhydrase inhibitors, benzothiadiazides and other diuretics, initial encounter: Secondary | ICD-10-CM | POA: Diagnosis not present

## 2023-10-23 DIAGNOSIS — I5043 Acute on chronic combined systolic (congestive) and diastolic (congestive) heart failure: Secondary | ICD-10-CM

## 2023-10-23 DIAGNOSIS — E876 Hypokalemia: Secondary | ICD-10-CM | POA: Diagnosis not present

## 2023-10-23 DIAGNOSIS — I110 Hypertensive heart disease with heart failure: Secondary | ICD-10-CM | POA: Diagnosis not present

## 2023-10-23 DIAGNOSIS — Z1152 Encounter for screening for COVID-19: Secondary | ICD-10-CM | POA: Diagnosis not present

## 2023-10-23 DIAGNOSIS — N401 Enlarged prostate with lower urinary tract symptoms: Secondary | ICD-10-CM | POA: Diagnosis present

## 2023-10-23 DIAGNOSIS — Z7982 Long term (current) use of aspirin: Secondary | ICD-10-CM

## 2023-10-23 DIAGNOSIS — I714 Abdominal aortic aneurysm, without rupture, unspecified: Secondary | ICD-10-CM | POA: Diagnosis present

## 2023-10-23 DIAGNOSIS — I5020 Unspecified systolic (congestive) heart failure: Secondary | ICD-10-CM | POA: Diagnosis not present

## 2023-10-23 DIAGNOSIS — Z91041 Radiographic dye allergy status: Secondary | ICD-10-CM | POA: Diagnosis not present

## 2023-10-23 DIAGNOSIS — I255 Ischemic cardiomyopathy: Secondary | ICD-10-CM | POA: Diagnosis present

## 2023-10-23 DIAGNOSIS — F17210 Nicotine dependence, cigarettes, uncomplicated: Secondary | ICD-10-CM | POA: Diagnosis not present

## 2023-10-23 DIAGNOSIS — G2581 Restless legs syndrome: Secondary | ICD-10-CM | POA: Diagnosis not present

## 2023-10-23 DIAGNOSIS — I340 Nonrheumatic mitral (valve) insufficiency: Secondary | ICD-10-CM | POA: Diagnosis present

## 2023-10-23 DIAGNOSIS — N138 Other obstructive and reflux uropathy: Secondary | ICD-10-CM | POA: Diagnosis not present

## 2023-10-23 DIAGNOSIS — J929 Pleural plaque without asbestos: Secondary | ICD-10-CM | POA: Diagnosis not present

## 2023-10-23 DIAGNOSIS — Z79899 Other long term (current) drug therapy: Secondary | ICD-10-CM

## 2023-10-23 DIAGNOSIS — Z8673 Personal history of transient ischemic attack (TIA), and cerebral infarction without residual deficits: Secondary | ICD-10-CM | POA: Diagnosis not present

## 2023-10-23 DIAGNOSIS — Z888 Allergy status to other drugs, medicaments and biological substances status: Secondary | ICD-10-CM

## 2023-10-23 DIAGNOSIS — E782 Mixed hyperlipidemia: Secondary | ICD-10-CM | POA: Diagnosis not present

## 2023-10-23 DIAGNOSIS — Z8546 Personal history of malignant neoplasm of prostate: Secondary | ICD-10-CM | POA: Diagnosis not present

## 2023-10-23 DIAGNOSIS — R0602 Shortness of breath: Secondary | ICD-10-CM | POA: Diagnosis present

## 2023-10-23 DIAGNOSIS — Z720 Tobacco use: Secondary | ICD-10-CM

## 2023-10-23 DIAGNOSIS — I2510 Atherosclerotic heart disease of native coronary artery without angina pectoris: Secondary | ICD-10-CM | POA: Diagnosis present

## 2023-10-23 DIAGNOSIS — D696 Thrombocytopenia, unspecified: Secondary | ICD-10-CM

## 2023-10-23 DIAGNOSIS — I252 Old myocardial infarction: Secondary | ICD-10-CM

## 2023-10-23 DIAGNOSIS — I10 Essential (primary) hypertension: Secondary | ICD-10-CM | POA: Diagnosis not present

## 2023-10-23 DIAGNOSIS — F17200 Nicotine dependence, unspecified, uncomplicated: Secondary | ICD-10-CM | POA: Diagnosis not present

## 2023-10-23 DIAGNOSIS — Z8249 Family history of ischemic heart disease and other diseases of the circulatory system: Secondary | ICD-10-CM | POA: Diagnosis not present

## 2023-10-23 DIAGNOSIS — R059 Cough, unspecified: Secondary | ICD-10-CM | POA: Diagnosis not present

## 2023-10-23 DIAGNOSIS — Z95810 Presence of automatic (implantable) cardiac defibrillator: Secondary | ICD-10-CM | POA: Diagnosis not present

## 2023-10-23 LAB — COMPREHENSIVE METABOLIC PANEL WITH GFR
ALT: 20 U/L (ref 0–44)
AST: 28 U/L (ref 15–41)
Albumin: 3.7 g/dL (ref 3.5–5.0)
Alkaline Phosphatase: 65 U/L (ref 38–126)
Anion gap: 11 (ref 5–15)
BUN: 22 mg/dL (ref 8–23)
CO2: 27 mmol/L (ref 22–32)
Calcium: 9.1 mg/dL (ref 8.9–10.3)
Chloride: 101 mmol/L (ref 98–111)
Creatinine, Ser: 0.98 mg/dL (ref 0.61–1.24)
GFR, Estimated: >60 mL/min (ref >60)
Glucose, Bld: 95 mg/dL (ref 70–99)
Potassium: 3.5 mmol/L (ref 3.5–5.1)
Sodium: 139 mmol/L (ref 135–145)
Total Bilirubin: 1.6 mg/dL — ABNORMAL HIGH (ref 0.0–1.2)
Total Protein: 6.4 g/dL — ABNORMAL LOW (ref 6.5–8.1)

## 2023-10-23 LAB — CBC
HCT: 49.5 % (ref 39.0–52.0)
Hemoglobin: 16.5 g/dL (ref 13.0–17.0)
MCH: 32.2 pg (ref 26.0–34.0)
MCHC: 33.3 g/dL (ref 30.0–36.0)
MCV: 96.5 fL (ref 80.0–100.0)
Platelets: 131 K/uL — ABNORMAL LOW (ref 150–400)
RBC: 5.13 MIL/uL (ref 4.22–5.81)
RDW: 14.7 % (ref 11.5–15.5)
WBC: 4.8 K/uL (ref 4.0–10.5)
nRBC: 0 % (ref 0.0–0.2)

## 2023-10-23 LAB — BRAIN NATRIURETIC PEPTIDE: B Natriuretic Peptide: 2576 pg/mL — ABNORMAL HIGH (ref 0.0–100.0)

## 2023-10-23 LAB — ECHOCARDIOGRAM COMPLETE
Area-P 1/2: 3.99 cm2
Est EF: 20
Height: 70 in
MV M vel: 4.79 m/s
MV Peak grad: 91.8 mmHg
P 1/2 time: 846 ms
S' Lateral: 6.3 cm
Weight: 2303.37 [oz_av]

## 2023-10-23 LAB — TROPONIN I (HIGH SENSITIVITY)
Troponin I (High Sensitivity): 43 ng/L — ABNORMAL HIGH (ref <18)
Troponin I (High Sensitivity): 33 ng/L — ABNORMAL HIGH (ref <18)

## 2023-10-23 LAB — RESP PANEL BY RT-PCR (RSV, FLU A&B, COVID)  RVPGX2
Influenza A by PCR: NEGATIVE
Influenza B by PCR: NEGATIVE
Resp Syncytial Virus by PCR: NEGATIVE
SARS Coronavirus 2 by RT PCR: NEGATIVE

## 2023-10-23 MED ORDER — ACETAMINOPHEN 650 MG RE SUPP: 650.0000 mg | Freq: Four times a day (QID) | RECTAL | Status: DC | PRN

## 2023-10-23 MED ORDER — SODIUM CHLORIDE 0.9% FLUSH
3.0000 mL | Freq: Two times a day (BID) | INTRAVENOUS | Status: DC
  Administered 2023-10-23 – 2023-10-25 (×4): 3 mL via INTRAVENOUS

## 2023-10-23 MED ORDER — BISACODYL 10 MG RE SUPP: 10.0000 mg | Freq: Every day | RECTAL | Status: DC | PRN

## 2023-10-23 MED ORDER — ONDANSETRON HCL 4 MG/2ML IJ SOLN: 4.0000 mg | Freq: Four times a day (QID) | INTRAMUSCULAR | Status: DC | PRN

## 2023-10-23 MED ORDER — FUROSEMIDE 10 MG/ML IJ SOLN
40.0000 mg | Freq: Once | INTRAMUSCULAR | Status: AC
  Administered 2023-10-23: 40 mg via INTRAVENOUS
  Filled 2023-10-23: qty 4

## 2023-10-23 MED ORDER — ONDANSETRON HCL 4 MG PO TABS: 4.0000 mg | ORAL_TABLET | Freq: Four times a day (QID) | ORAL | Status: DC | PRN

## 2023-10-23 MED ORDER — ALBUTEROL SULFATE (2.5 MG/3ML) 0.083% IN NEBU: 5.0000 mg | INHALATION_SOLUTION | Freq: Once | RESPIRATORY_TRACT | Status: DC

## 2023-10-23 MED ORDER — SODIUM CHLORIDE 0.9% FLUSH
3.0000 mL | Freq: Two times a day (BID) | INTRAVENOUS | Status: DC
  Administered 2023-10-23 – 2023-10-25 (×5): 3 mL via INTRAVENOUS

## 2023-10-23 MED ORDER — FUROSEMIDE 10 MG/ML IJ SOLN
20.0000 mg | Freq: Every evening | INTRAMUSCULAR | Status: DC
  Administered 2023-10-23: 20 mg via INTRAVENOUS
  Filled 2023-10-23: qty 2

## 2023-10-23 MED ORDER — POLYETHYLENE GLYCOL 3350 17 G PO PACK: 17.0000 g | PACK | Freq: Every day | ORAL | Status: DC | PRN

## 2023-10-23 MED ORDER — IPRATROPIUM-ALBUTEROL 0.5-2.5 (3) MG/3ML IN SOLN
3.0000 mL | Freq: Four times a day (QID) | RESPIRATORY_TRACT | Status: DC
  Administered 2023-10-23 (×2): 3 mL via RESPIRATORY_TRACT
  Filled 2023-10-23 (×2): qty 3

## 2023-10-23 MED ORDER — ALPRAZOLAM 0.25 MG PO TABS
0.2500 mg | ORAL_TABLET | Freq: Every evening | ORAL | Status: DC | PRN
  Administered 2023-10-23 – 2023-10-24 (×2): 0.25 mg via ORAL
  Filled 2023-10-23 (×2): qty 1

## 2023-10-23 MED ORDER — NICOTINE 14 MG/24HR TD PT24
14.0000 mg | MEDICATED_PATCH | Freq: Every day | TRANSDERMAL | Status: DC
  Administered 2023-10-23: 14 mg via TRANSDERMAL
  Filled 2023-10-23 (×3): qty 1

## 2023-10-23 MED ORDER — ALBUTEROL SULFATE (2.5 MG/3ML) 0.083% IN NEBU
2.5000 mg | INHALATION_SOLUTION | RESPIRATORY_TRACT | Status: DC | PRN
  Administered 2023-10-24: 2.5 mg via RESPIRATORY_TRACT
  Filled 2023-10-23: qty 3

## 2023-10-23 MED ORDER — IPRATROPIUM-ALBUTEROL 0.5-2.5 (3) MG/3ML IN SOLN
3.0000 mL | Freq: Three times a day (TID) | RESPIRATORY_TRACT | Status: DC
Start: 2023-10-24 — End: 2023-10-24
  Administered 2023-10-24 (×3): 3 mL via RESPIRATORY_TRACT
  Filled 2023-10-23 (×3): qty 3

## 2023-10-23 MED ORDER — PERFLUTREN LIPID MICROSPHERE
1.0000 mL | INTRAVENOUS | Status: AC | PRN
  Administered 2023-10-23: 3 mL via INTRAVENOUS

## 2023-10-23 MED ORDER — IPRATROPIUM-ALBUTEROL 0.5-2.5 (3) MG/3ML IN SOLN
3.0000 mL | Freq: Once | RESPIRATORY_TRACT | Status: AC
Start: 2023-10-23 — End: 2023-10-23
  Administered 2023-10-23: 3 mL via RESPIRATORY_TRACT
  Filled 2023-10-23: qty 3

## 2023-10-23 MED ORDER — IPRATROPIUM BROMIDE 0.02 % IN SOLN: 0.5000 mg | Freq: Once | RESPIRATORY_TRACT | Status: DC

## 2023-10-23 MED ORDER — PREDNISONE 20 MG PO TABS
50.0000 mg | ORAL_TABLET | Freq: Every day | ORAL | Status: DC
  Administered 2023-10-23 – 2023-10-25 (×3): 50 mg via ORAL
  Filled 2023-10-23 (×3): qty 1

## 2023-10-23 MED ORDER — ACETAMINOPHEN 325 MG PO TABS: 650.0000 mg | ORAL_TABLET | Freq: Four times a day (QID) | ORAL | Status: DC | PRN

## 2023-10-23 MED ORDER — SODIUM CHLORIDE 0.9 % IV SOLN: INTRAVENOUS | Status: AC | PRN

## 2023-10-23 MED ORDER — ASPIRIN 81 MG PO TBEC
81.0000 mg | DELAYED_RELEASE_TABLET | Freq: Every day | ORAL | Status: DC
  Administered 2023-10-24 – 2023-10-25 (×2): 81 mg via ORAL
  Filled 2023-10-23 (×2): qty 1

## 2023-10-23 MED ORDER — FUROSEMIDE 10 MG/ML IJ SOLN
40.0000 mg | Freq: Every day | INTRAMUSCULAR | Status: DC
Start: 2023-10-24 — End: 2023-10-24
  Administered 2023-10-24: 40 mg via INTRAVENOUS
  Filled 2023-10-23: qty 4

## 2023-10-23 MED ORDER — HEPARIN SODIUM (PORCINE) 5000 UNIT/ML IJ SOLN
5000.0000 [IU] | Freq: Three times a day (TID) | INTRAMUSCULAR | Status: DC
  Administered 2023-10-23 – 2023-10-25 (×5): 5000 [IU] via SUBCUTANEOUS
  Filled 2023-10-23 (×6): qty 1

## 2023-10-23 MED ORDER — SODIUM CHLORIDE 0.9% FLUSH: 3.0000 mL | INTRAVENOUS | Status: DC | PRN

## 2023-10-23 MED ORDER — ALBUTEROL SULFATE (2.5 MG/3ML) 0.083% IN NEBU
2.5000 mg | INHALATION_SOLUTION | Freq: Once | RESPIRATORY_TRACT | Status: AC
  Administered 2023-10-23: 2.5 mg via RESPIRATORY_TRACT
  Filled 2023-10-23: qty 3

## 2023-10-23 MED ORDER — AZITHROMYCIN 250 MG PO TABS
500.0000 mg | ORAL_TABLET | Freq: Every day | ORAL | Status: DC
  Administered 2023-10-23 – 2023-10-25 (×3): 500 mg via ORAL
  Filled 2023-10-23 (×3): qty 2

## 2023-10-23 NOTE — H&P (Addendum)
 History and Physical    Patient: Daniel [name]. FMW:[id number] DOB: 1945-07-13 DOA: 10/23/2023 DOS: the patient was seen and examined on 10/23/2023 PCP: Candise Aleene DEL, MD  Patient coming from: Home  Chief Complaint:  Chief Complaint  Patient presents with   Shortness of Breath   HPI: Daniel [name]. is a 78 y.o. male with medical history significant for chronic systolic heart failure status post ICD placement and pacemaker, history of CAD status post CABG (CAD s/p CABG LIMA to D1, SVG to LAD, SVG to PDA on 05/12/2016, ), hypertension, COPD, history of A. fib briefly after CABG ,  ICM with baseline EF 30-35% (now down to 20 % on 10/23/23), and h/o prostate CA who presents to the ED with ongoing chest tightness cough and dyspnea worse over the last 3 days, he reports orthopnea and acute on chronic leg swelling, cough is more productive with colored sputum, sadly patient continues to smoke No fever  Or chills   No Nausea, Vomiting or Diarrhea - Patient is voiding well - Reports compliance with medication  Echo from 10/23/23 with LVEF approximately 20% with wall motion abnormalties consistent with ischemic cardiomyopathy and Moderate to severe mitral regurgitation.  -- Chest x-ray suggest recommend continue, status post CABG and pulmonary interstitial edema -BNP 2556 much higher than previous Tropinin 43 >> 33, EKG with atrial ventricular dual paced complexes - COVID flu and RSV negative - CMP WNL except for T. bili of 1.3  platelets down to 131 patient has a history of chronic thrombocytopenia, otherwise CBC normal  Review of Systems: As mentioned in the history of present illness. All other systems reviewed and are negative. Past Medical History:  Diagnosis Date   Abdominal aortic aneurysm 01/2021   4 cm, infrarenal.  Also enlarged left common iliac artery--vascular eval 03/2021->rpt aortic u/s 06/01/22 stable Aorta 4.2 cm.  Bilat common iliac aneurisms and left common femoral  aneurism-->f/u 1 yr   Acute prostatitis 06/19/2013   Anxiety    Atrial fibrillation (HCC)    Limited episode, emergency room, June, 2013, spontaneous conversion to sinus rhythm, was evaluated By Dr. Micky 2013- no further visits.  Post-op (CABG) a-fib, converted on amio but pt self d/c'd this med due to DOE/side effect profile.   Blurred vision 01/01/2020   Bradycardia 06/20/2017   CAD (coronary artery disease) 05/2016   a. 05/2016: NSTEMI with cath showing 3-vessel disease. Underwent CABG on 05/12/16 with LIMA-D1, SVG-LAD, and SVG-PDA.    COPD (chronic obstructive pulmonary disease) (HCC) fall 2016   Bullous changes noted on lower lung images of CT abd done by urology   Diverticulitis    Double vision 01/2020   MG ab w/u NEG.  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 Unable to have MRI due to having shrapnel in forehead.   Hyperlipidemia    statin intolerant. Pt declined advanced lipid clinic referral 08/2019, 03/2020, and 01/2021.   Hypertension    Ischemic cardiomyopathy 05/2016   EF 20-25% at the time of NSTEMI/CABG.  Repeat EF 07/2016 30-35%.  07/2017 EF 25-30%, pt intol of entresto , bidil, and BB--for ICD 03/2019.   Microscopic hematuria Fall 2016   CT nl  except nonobstructing stones.  Cystoscopy normal 12/30/14 (Dr. 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  Urol (Dr. Beola) at [facility]; Dr. Beola referred him to Dr. Renda 03/2015: patient got bilat nerve  sparing , robot assisted laparoscopic radical prostatectomy and pelvic lymphadenectomy.  Urol f/u, PSA surveillance showing very mild uptrend of PSA but not to the level of biochemical recurrence as of 05/2021 urology follow-up   RLS (restless legs syndrome)    Tobacco dependence    down to 1-2 cigs per day as of 11/2015.  Restarted 08/2016.   Urinary retention 05/2015   occurred s/p foley removal   UTI (urinary tract infection) 11/14/2014   Past Surgical History:  Procedure Laterality Date   aortic ultrasound  07/2012   2014 NO AAA.  01/2021-->4 cm AAA with R common iliac ectasia-->vasc surg ref   COLONOSCOPY  approx 2011 per pt   Done by surgeon in [place] after a bout of diverticulitis; normal per pt report--was told to repeat in 10 yrs.   CORONARY ARTERY BYPASS GRAFT N/A 05/12/2016   Procedure: CORONARY ARTERY BYPASS GRAFTING (CABG) TIMES THREE USING LEFT INTERNAL MAMMARY ARTERY AND RIGHT SAPHENOUS LEG VEIN HARVESTED ENDOSCOPICALLY;  Surgeon: Elspeth JAYSON Millers, MD;  Location: [facility] OR;  Service: Open Heart Surgery;  Laterality: N/A;   ICD IMPLANT N/A 04/12/2019   Procedure: ICD IMPLANT;  Surgeon: Waddell Danelle ORN, MD;  Location: [facility] INVASIVE CV LAB;  Service: Cardiovascular;  Laterality: N/A;   INGUINAL HERNIA REPAIR  2003 and 2004   Both sides.   LAPAROSCOPY  2017   LEFT HEART CATH AND CORONARY ANGIOGRAPHY N/A 05/09/2016   Procedure: Left Heart Cath and Coronary Angiography;  Surgeon: Debby DELENA Sor, MD;  Location: [facility] INVASIVE CV LAB;  Service: Cardiovascular;  Laterality: N/A;   LITHOTRIPSY  2004   Dr. Verdene in [place].   LYMPHADENECTOMY Bilateral 04/30/2015   Procedure: PELVIC LYMPHADENECTOMY;  Surgeon: Gretel Renda, MD;  Location: [facility] ORS;  Service: Urology;  Laterality: Bilateral;   PROSTATE BIOPSY  02/05/2015   Prostate adenocarcinoma: pt considering treatment options as of 02/19/15   ROBOT ASSISTED LAPAROSCOPIC RADICAL PROSTATECTOMY N/A 04/30/2015   Procedure: XI ROBOTIC ASSISTED  LAPAROSCOPIC RADICAL PROSTATECTOMY LEVEL 2;  Surgeon: Gretel Renda, MD;  Location: [facility] ORS;  Service: Urology;  Laterality: N/A;   TEE WITHOUT CARDIOVERSION N/A 05/12/2016   Procedure: TRANSESOPHAGEAL ECHOCARDIOGRAM (TEE);  Surgeon: Elspeth JAYSON Millers, MD;  Location: [facility] OR;  Service: Open Heart Surgery;  Laterality: N/A;   TRANSTHORACIC ECHOCARDIOGRAM  03/22/2006; 05/2016; 07/2016; 07/2017; 01/02/20   2008: EF 65%, normal valves, no wall motion abnormalties, LA size normal.  05/2016 in context of non-STEMI, EF 20-25%, mild AV and MV regurg.  08/05/16: EF 30-35%, akinesis of mid lateral myoc, grd II DD, mild aortic 07/2017: EF 25-30%, mult areas of akinesis, grd I DD. 01/2020 EF 25-30%, sev LV dsfxn, valves fine, aortic root 40mm   Social History:  reports that he has been smoking cigarettes. He has a 55 pack-year smoking history. He has never used smokeless tobacco. He reports that he does not drink alcohol and does not use drugs.  Allergies  Allergen Reactions   Entresto  [Sacubitril -Valsartan ] Hives and Shortness Of Breath   Amlodipine  Swelling  LE swelling   Contrast Media [Iodinated Contrast Media] Other (See Comments)    Prostate problem had to wear a catheter for two weeks after having dye.    Hydralazine  Palpitations and Other (See Comments)    Fatigue+    Family History  Problem Relation Age of Onset   Hypertension Mother    Cancer - Other Mother 44       breast   Cancer Father        Lung ca age 23   Diabetes Sister     Prior to Admission medications   Medication Sig Start Date End Date Taking? Authorizing Provider  ALPRAZolam  (XANAX ) 0.25 MG tablet tAKE 1 to 2 tabLETS BY MOUTH AT BEDTIME AS NEEDED FOR insomnia/restless legs 10/18/23   McGowen, Aleene DEL, MD  aspirin  EC 81 MG tablet Take 1 tablet (81 mg total) by mouth daily. Swallow whole. 02/05/20   Lavona Agent, MD  potassium chloride  SA (KLOR-CON  M) 20 MEQ tablet Take 1 tablet (20 mEq total) by mouth 2 (two) times daily for 4  days. 09/06/23 09/13/23  Kuneff, Renee A, DO  Cetirizine  HCl (ZYRTEC  ALLERGY) 10 MG CAPS Take 1 capsule (10 mg total) by mouth daily. 11/06/14 11/06/14  Lenor Hollering, MD    Physical Exam: Vitals:   10/23/23 0945 10/23/23 1015 10/23/23 1039 10/23/23 1446  BP: (!) 146/88 (!) 147/96 (!) 132/99   Pulse:  72 73   Resp: (!) 22 16    Temp:      TempSrc:      SpO2:  100% 100% 99%  Weight:      Height:        Physical Exam Gen:- Awake Alert, conversational dyspnea HEENT:- [place].AT, No sclera icterus Neck-Supple Neck,No JVD,.  Lungs-diminished breath sounds with bibasilar rales and few scattered wheezes in upper lung fields CV- S1, S2 normal, RRR, prior CABG scar, pacemaker in situ Abd-  +ve B.Sounds, Abd Soft, No tenderness,    Extremity/Skin:- +ve edema,   good pedal pulses  Psych-affect is appropriate, oriented x3 Neuro-no new focal deficits, no tremors  Data Reviewed: Echo from 10/23/23 with LVEF approximately 20% with wall motion abnormalties consistent with ischemic cardiomyopathy and Moderate to severe mitral regurgitation.  -- Chest x-ray suggest recommend continue, status post CABG and pulmonary interstitial edema -BNP 2556 much higher than previous Tropinin 43 >> 33, EKG with atrial ventricular dual paced complexes - COVID flu and RSV negative - CMP WNL except for T. bili of 1.3  platelets down to 131 patient has a history of chronic thrombocytopenia, otherwise CBC normal  Assessment and Plan:  1) acute exacerbation of systolic dysfunction CHF--POA Echo from 10/23/23 with LVEF approximately 20% with wall motion abnormalties consistent with ischemic cardiomyopathy and Moderate to severe mitral regurgitation. -His EF was previously as high as 30 to 35% -- Chest x-ray suggest recommend continue, status post CABG and pulmonary interstitial edema -BNP 2556 much higher than previous -- IV Lasix  daily with input and output monitoring - Hold benazepril  and Farxiga  to allow BP room for  aggressive diuresis - Get cardiology  2)CAD s/p CABG LIMA to D1, SVG to LAD, SVG to PDA on 05/12/2016- Tropinin 43 >> 33, EKG with atrial ventricular dual paced complexes\ -- Continue aspirin  - Patient apparently is intolerant to statins,   3)Hypertension: Hold benazepril  to give room to allow for aggressive diuresis as above #1   4) history of ischemic cardiomyopathy/status post ICD:-Continue to follow-up with Dr. Waddell his EP cardiologist  5)AAA: He has a 42 mm infrarenal abdominal aneurysm and aneurysmal dilatation of the common iliacs.  He is followed by Dr. Sheree.    6)COPD/Tobacco abuse--Mild COPD excerb --smoking cessation advised, prednisone , azithro amd bronchiodilators as ordered-   Advance Care Planning:   Code Status: Full Code   Consults: cardiology  Family Communication: None at bedside  Severity of Illness: The appropriate patient status for this patient is INPATIENT. Inpatient status is judged to be reasonable and necessary in order to provide the required intensity of service to ensure the patient's safety. [name] presenting symptoms, physical exam findings, and initial radiographic and laboratory data in the context of their chronic comorbidities is felt to place them at high risk for further clinical deterioration. Furthermore, it is not anticipated that the patient will be medically stable for discharge from the hospital within 2 midnights of admission.   * I certify that at the point of admission it is my clinical judgment that the patient will require inpatient hospital care spanning beyond 2 midnights from the point of admission due to high intensity of service, high risk for further deterioration and high frequency of surveillance required.*  Author: Rendall Carwin, MD 10/23/2023 6:25 PM  For on call review [email or web address].

## 2023-10-23 NOTE — ED Provider Notes (Signed)
 [place] EMERGENCY DEPARTMENT AT [facility] Provider Note   CSN: [id number] Arrival date & time: 10/23/23  [id number]     Patient presents with: Shortness of Breath   Daniel [name]. is a 78 y.o. male.   Pt with hx cad/cabg, presents with chest tightness and sob in past 2-3 days. Chest has generally felt tight, constant. No exertional/episodic or pleuritic chest pain. +cough, occasionally productive whitish phlegm. +smoker. Denies hx copd or regular inhaler use. States compliant w home meds. Notes chronic swelling to bilateral lower legs, symmetric, ?mildly worse.  Urinating normal amount. +orthopnea.  No fever or chills. No known ill contacts.   The history is provided by the patient, medical records and a relative.  Shortness of Breath Associated symptoms: cough   Associated symptoms: no abdominal pain, no fever, no headaches, no neck pain, no rash, no sore throat and no vomiting        Prior to Admission medications   Medication Sig Start Date End Date Taking? Authorizing Provider  ALPRAZolam  (XANAX ) 0.25 MG tablet tAKE 1 to 2 tabLETS BY MOUTH AT BEDTIME AS NEEDED FOR insomnia/restless legs 10/18/23   McGowen, Aleene DEL, MD  aspirin  EC 81 MG tablet Take 1 tablet (81 mg total) by mouth daily. Swallow whole. 02/05/20   Lavona Lynwood, MD  benazepril  (LOTENSIN ) 20 MG tablet Take 1 tablet (20 mg total) by mouth daily. 1 tab po bid 11/16/22   McGowen, Aleene DEL, MD  dapagliflozin  propanediol (FARXIGA ) 10 MG TABS tablet TAKE ONE TABLET BY MOUTH DAILY BEFORE BREAKFAST 07/14/23   Swinyer, Rosaline HERO, NP  fluticasone  (FLONASE ) 50 MCG/ACT nasal spray Place 2 sprays into both nostrils daily. 08/31/22   McGowen, Aleene DEL, MD  furosemide  (LASIX ) 20 MG tablet Take 1 tablet (20 mg total) by mouth 2 (two) times daily. 07/10/23   McGowen, Aleene DEL, MD  Omega-3 Fatty Acids (FISH OIL) 1000 MG CAPS Take 1,000 mg by mouth daily.     [provider]  ondansetron  (ZOFRAN ) 4 MG tablet Take 1  tablet (4 mg total) by mouth every 8 (eight) hours as needed for nausea or vomiting. 10/06/20   McGowen, Aleene DEL, MD  Polyethyl Glycol-Propyl Glycol (LUBRICANT EYE DROPS) 0.4-0.3 % SOLN Place 1 drop into both eyes 3 (three) times daily as needed (dry/irritated eyes.).    [provider]  potassium chloride  SA (KLOR-CON  M) 20 MEQ tablet Take 1 tablet (20 mEq total) by mouth 2 (two) times daily for 4 days. 09/06/23 09/13/23  Kuneff, Renee A, DO  Vitamin D, Cholecalciferol, 400 units CAPS Take 400 Units by mouth daily.     [provider]  Cetirizine  HCl (ZYRTEC  ALLERGY) 10 MG CAPS Take 1 capsule (10 mg total) by mouth daily. 11/06/14 11/06/14  Lenor Hollering, MD    Allergies: Entresto  [sacubitril -valsartan ], Amlodipine , Contrast media [iodinated contrast media], and Hydralazine     Review of Systems  Constitutional:  Negative for chills and fever.  HENT:  Negative for sore throat and trouble swallowing.   Respiratory:  Positive for cough and shortness of breath.   Cardiovascular:  Negative for palpitations.  Gastrointestinal:  Negative for abdominal pain, nausea and vomiting.  Genitourinary:  Negative for dysuria and flank pain.  Musculoskeletal:  Negative for back pain and neck pain.  Skin:  Negative for rash.  Neurological:  Negative for weakness, numbness and headaches.  Psychiatric/Behavioral:  Negative for confusion.     Updated Vital Signs BP 134/88   Pulse 73  Temp 98.7 F (37.1 C) (Oral)   Resp 18   Ht 1.778 m (5' 10)   Wt 65.3 kg   SpO2 98%   BMI 20.66 kg/m   Physical Exam Vitals and nursing note reviewed.  Constitutional:      Appearance: Normal appearance. He is well-developed.  HENT:     Head: Atraumatic.     Nose: Nose normal.     Mouth/Throat:     Mouth: Mucous membranes are moist.     Pharynx: Oropharynx is clear. No oropharyngeal exudate or posterior oropharyngeal erythema.  Eyes:     General: No scleral icterus.    Conjunctiva/sclera:  Conjunctivae normal.     Pupils: Pupils are equal, round, and reactive to light.  Neck:     Trachea: No tracheal deviation.     Comments: Trachea midline, thyroid  not grossly enlarged or tender. No neck stiffness or rigidity.  Cardiovascular:     Rate and Rhythm: Normal rate and regular rhythm.     Pulses: Normal pulses.     Heart sounds: Normal heart sounds. No murmur heard.    No friction rub. No gallop.  Pulmonary:     Effort: Pulmonary effort is normal. No accessory muscle usage or respiratory distress.     Breath sounds: Wheezing and rales present.     Comments: Rales bases.  Chest:     Chest wall: No tenderness.  Abdominal:     General: Bowel sounds are normal. There is no distension.     Palpations: Abdomen is soft. There is no mass.     Tenderness: There is no abdominal tenderness. There is no guarding.  Genitourinary:    Comments: No cva tenderness. Musculoskeletal:        General: No tenderness.     Cervical back: Normal range of motion and neck supple. No rigidity.     Comments: Mild symmetric bilateral lower leg edema - pt mildly worse than baseline.   Skin:    General: Skin is warm and dry.     Findings: No rash.  Neurological:     Mental Status: He is alert.     Comments: Alert, speech clear. Motor/sens grossly intact bil.   Psychiatric:        Mood and Affect: Mood normal.     (all labs ordered are listed, but only abnormal results are displayed) Results for orders placed or performed during the hospital encounter of 10/23/23  CBC   Collection Time: 10/23/23  7:45 AM  Result Value Ref Range   WBC 4.8 4.0 - 10.5 K/uL   RBC 5.13 4.22 - 5.81 MIL/uL   Hemoglobin 16.5 13.0 - 17.0 g/dL   HCT 50.4 60.9 - 47.9 %   MCV 96.5 80.0 - 100.0 fL   MCH 32.2 26.0 - 34.0 pg   MCHC 33.3 30.0 - 36.0 g/dL   RDW 85.2 88.4 - 84.4 %   Platelets 131 (L) 150 - 400 K/uL   nRBC 0.0 0.0 - 0.2 %  Comprehensive metabolic panel with GFR   Collection Time: 10/23/23  7:45 AM   Result Value Ref Range   Sodium 139 135 - 145 mmol/L   Potassium 3.5 3.5 - 5.1 mmol/L   Chloride 101 98 - 111 mmol/L   CO2 27 22 - 32 mmol/L   Glucose, Bld 95 70 - 99 mg/dL   BUN 22 8 - 23 mg/dL   Creatinine, Ser 9.01 0.61 - 1.24 mg/dL   Calcium  9.1 8.9 - 10.3 mg/dL  Total Protein 6.4 (L) 6.5 - 8.1 g/dL   Albumin  3.7 3.5 - 5.0 g/dL   AST 28 15 - 41 U/L   ALT 20 0 - 44 U/L   Alkaline Phosphatase 65 38 - 126 U/L   Total Bilirubin 1.6 (H) 0.0 - 1.2 mg/dL   GFR, Estimated >39 >39 mL/min   Anion gap 11 5 - 15  Troponin I (High Sensitivity)   Collection Time: 10/23/23  7:45 AM  Result Value Ref Range   Troponin I (High Sensitivity) 43 (H) <18 ng/L  Brain natriuretic peptide   Collection Time: 10/23/23  7:52 AM  Result Value Ref Range   B Natriuretic Peptide 2,576.0 (H) 0.0 - 100.0 pg/mL   DG Chest Port 1 View Result Date: 10/23/2023 EXAM: 1 VIEW XRAY OF THE CHEST 10/23/2023 08:10:00 AM COMPARISON: 09/06/2023 CLINICAL HISTORY: Pt c/o chest tightness starting about three days ago. Pt states having a congested cough. Pt states he has had cough for over a month and had a chest xray done by PCP. Pt has Abbot pacemaker. Pt states increased SHOB over the past week. FINDINGS: LUNGS AND PLEURA: Mild interstitial thickening with peripheral septal markings identified compatible with mild edema. No signs of significant pleural effusion, pneumothorax or consolidative change. HEART AND MEDIASTINUM: Persistent cardiomegaly. CABG noted. Left chest pacemaker/AICD with leads overlying right atrium and right ventricle. Median sternotomy wires noted. BONES AND SOFT TISSUES: No acute osseous abnormality. IMPRESSION: 1. Findings consistent with mild pulmonary interstitial edema; no pleural effusion or pneumothorax. 2. Cardiomegaly.  Status post CABG. Electronically signed by: Waddell Calk MD 10/23/2023 08:23 AM EDT RP Workstation: [id number]   CUP PACEART REMOTE DEVICE CHECK Result Date: 10/07/2023 ICD  Scheduled remote reviewed. Normal device function.  Presenting rhythm:  AP-VP, PVCs.  5 AHR detections, longest lasting 16 seconds.  HF diagnostics have been abnormal in this monitoring period.  Occasional out of range measurements on autocapture trends. Next remote 91 days. - CS, CVRS    EKG: EKG Interpretation Date/Time:  Monday October 23 2023 07:40:47 EDT Ventricular Rate:  71 PR Interval:  187 QRS Duration:  163 QT Interval:  448 QTC Calculation: 487 R Axis:   -79  Text Interpretation: Atrial-ventricular dual-paced complexes Confirmed by Bernard Drivers ([id number]) on 10/23/2023 7:42:53 AM  Radiology: [facility] Chest Port 1 View Result Date: 10/23/2023 EXAM: 1 VIEW XRAY OF THE CHEST 10/23/2023 08:10:00 AM COMPARISON: 09/06/2023 CLINICAL HISTORY: Pt c/o chest tightness starting about three days ago. Pt states having a congested cough. Pt states he has had cough for over a month and had a chest xray done by PCP. Pt has Abbot pacemaker. Pt states increased SHOB over the past week. FINDINGS: LUNGS AND PLEURA: Mild interstitial thickening with peripheral septal markings identified compatible with mild edema. No signs of significant pleural effusion, pneumothorax or consolidative change. HEART AND MEDIASTINUM: Persistent cardiomegaly. CABG noted. Left chest pacemaker/AICD with leads overlying right atrium and right ventricle. Median sternotomy wires noted. BONES AND SOFT TISSUES: No acute osseous abnormality. IMPRESSION: 1. Findings consistent with mild pulmonary interstitial edema; no pleural effusion or pneumothorax. 2. Cardiomegaly.  Status post CABG. Electronically signed by: Waddell Calk MD 10/23/2023 08:23 AM EDT RP Workstation: [id number]     Procedures   Medications Ordered in the ED  furosemide  (LASIX ) injection 40 mg (has no administration in time range)  ipratropium-albuterol  (DUONEB) 0.5-2.5 (3) MG/3ML nebulizer solution 3 mL (3 mLs Nebulization Given 10/23/23 0826)  albuterol  (PROVENTIL )  (2.5 MG/3ML) 0.083% nebulizer solution 2.5 mg (  2.5 mg Nebulization Given 10/23/23 0826)                                    Medical Decision Making Problems Addressed: Acute on chronic combined systolic and diastolic CHF (congestive heart failure) (HCC): acute illness or injury with systemic symptoms that poses a threat to life or bodily functions Elevated brain natriuretic peptide (BNP) level: acute illness or injury Elevated troponin: acute illness or injury Shortness of breath: acute illness or injury with systemic symptoms that poses a threat to life or bodily functions Thrombocytopenia (HCC): chronic illness or injury Tobacco use: chronic illness or injury that poses a threat to life or bodily functions Wheezing: acute illness or injury with systemic symptoms that poses a threat to life or bodily functions  Amount and/or Complexity of Data Reviewed Independent Historian:     Details: Family, hx External Data Reviewed: notes. Labs: ordered. Decision-making details documented in ED Course. Radiology: ordered and independent interpretation performed. Decision-making details documented in ED Course. ECG/medicine tests: ordered and independent interpretation performed. Decision-making details documented in ED Course. Discussion of management or test interpretation with external provider(s): Medicine.   Risk Prescription drug management. Decision regarding hospitalization.   Iv ns. Continuous pulse ox and cardiac monitoring. Labs ordered/sent. Imaging ordered.   Differential diagnosis includes chf, copd, acs, etc. Dispo decision including potential need for admission considered - will get labs and imaging and reassess.   Reviewed nursing notes and prior charts for additional history. External reports reviewed. Additional history from: family.   Cardiac monitor: paced rhythm, rate 78.  Albuterol  and atrovent  neb.   Labs reviewed/interpreted by me - wbc and hgb normal. Chem largely  unremarkable. Bnp is very high/higher than previous. Lasix  iv.  Trop mildly elev. ?strain. No current chest pain.   Xrays reviewed/interpreted by me - vascular congestion/edema.   Hospitalists consulted for admission.   Recheck pt, wheezing improved.         Final diagnoses:  Acute on chronic combined systolic and diastolic CHF (congestive heart failure) (HCC)  Shortness of breath  Wheezing  Tobacco use  Elevated brain natriuretic peptide (BNP) level  Elevated troponin  Thrombocytopenia [facility])    ED Discharge Orders     None          Bernard Drivers, MD 10/23/23 [phone]

## 2023-10-23 NOTE — ED Triage Notes (Signed)
 Pt c/o chest tightness starting about three days ago. Pt states having a congested cough. Pt states he has had cough for over a month and had a chest xray done by PCP. Pt has Abbot pacemaker. Pt states increased SHOB over the past week.

## 2023-10-23 NOTE — Progress Notes (Signed)
 Mobility Specialist Progress Note:    10/23/23 1112  Mobility  Activity Ambulated with assistance  Level of Assistance Modified independent, requires aide device or extra time  Assistive Device None  Distance Ambulated (ft) 20 ft  Range of Motion/Exercises Active;All extremities  Activity Response Tolerated well  Mobility Referral Yes  Mobility visit 1 Mobility  Mobility Specialist Start Time (ACUTE ONLY) 1112  Mobility Specialist Stop Time (ACUTE ONLY) 1129  Mobility Specialist Time Calculation (min) (ACUTE ONLY) 17 min   Pt received sitting EOB, bed alarm going off. Pt requesting assistance to bathroom. ModI to stand and ambulate with no AD. Returned sitting EOB, all needs met.  Billiejean Schimek Mobility Specialist Please contact via Special educational needs teacher or  Rehab office at [phone]

## 2023-10-23 NOTE — Plan of Care (Signed)

## 2023-10-24 ENCOUNTER — Encounter (HOSPITAL_COMMUNITY): Payer: Self-pay | Admitting: Family Medicine

## 2023-10-24 ENCOUNTER — Ambulatory Visit: Admitting: Family Medicine

## 2023-10-24 DIAGNOSIS — I340 Nonrheumatic mitral (valve) insufficiency: Secondary | ICD-10-CM | POA: Diagnosis not present

## 2023-10-24 DIAGNOSIS — I5023 Acute on chronic systolic (congestive) heart failure: Secondary | ICD-10-CM | POA: Diagnosis not present

## 2023-10-24 DIAGNOSIS — I5020 Unspecified systolic (congestive) heart failure: Secondary | ICD-10-CM | POA: Diagnosis not present

## 2023-10-24 DIAGNOSIS — I255 Ischemic cardiomyopathy: Secondary | ICD-10-CM | POA: Diagnosis not present

## 2023-10-24 DIAGNOSIS — Z95810 Presence of automatic (implantable) cardiac defibrillator: Secondary | ICD-10-CM | POA: Diagnosis not present

## 2023-10-24 DIAGNOSIS — I2510 Atherosclerotic heart disease of native coronary artery without angina pectoris: Secondary | ICD-10-CM | POA: Diagnosis not present

## 2023-10-24 LAB — BASIC METABOLIC PANEL WITH GFR
Anion gap: 12 (ref 5–15)
BUN: 27 mg/dL — ABNORMAL HIGH (ref 8–23)
CO2: 26 mmol/L (ref 22–32)
Calcium: 8.9 mg/dL (ref 8.9–10.3)
Chloride: 101 mmol/L (ref 98–111)
Creatinine, Ser: 1.01 mg/dL (ref 0.61–1.24)
GFR, Estimated: >60 mL/min (ref >60)
Glucose, Bld: 112 mg/dL — ABNORMAL HIGH (ref 70–99)
Potassium: 3.3 mmol/L — ABNORMAL LOW (ref 3.5–5.1)
Sodium: 139 mmol/L (ref 135–145)

## 2023-10-24 MED ORDER — FUROSEMIDE 10 MG/ML IJ SOLN
20.0000 mg | Freq: Two times a day (BID) | INTRAMUSCULAR | Status: DC
  Administered 2023-10-24 – 2023-10-25 (×2): 20 mg via INTRAVENOUS
  Filled 2023-10-24 (×2): qty 2

## 2023-10-24 MED ORDER — POTASSIUM CHLORIDE CRYS ER 20 MEQ PO TBCR
40.0000 meq | EXTENDED_RELEASE_TABLET | ORAL | Status: AC
  Administered 2023-10-24 (×2): 40 meq via ORAL
  Filled 2023-10-24 (×2): qty 2

## 2023-10-24 MED ORDER — DAPAGLIFLOZIN PROPANEDIOL 10 MG PO TABS
10.0000 mg | ORAL_TABLET | Freq: Every day | ORAL | Status: DC
  Administered 2023-10-24 – 2023-10-25 (×2): 10 mg via ORAL
  Filled 2023-10-24 (×2): qty 1

## 2023-10-24 MED ORDER — IPRATROPIUM-ALBUTEROL 0.5-2.5 (3) MG/3ML IN SOLN
3.0000 mL | Freq: Two times a day (BID) | RESPIRATORY_TRACT | Status: DC
  Filled 2023-10-24: qty 3

## 2023-10-24 NOTE — Plan of Care (Signed)
   Problem: Education: Goal: Knowledge of General Education information will improve Description Including pain rating scale, medication(s)/side effects and non-pharmacologic comfort measures Outcome: Progressing   Problem: Health Behavior/Discharge Planning: Goal: Ability to manage health-related needs will improve Outcome: Progressing

## 2023-10-24 NOTE — Consult Note (Signed)
 Cardiology Consultation   Patient ID: Daniel [name]. MRN: [id number]; DOB: [birth year]  Admit date: 10/23/2023 Date of Consult: 10/24/2023  PCP:  Daniel Aleene DEL, MD   [facility] Providers Cardiologist:  Daniel Schilling, MD  Electrophysiologist:  Daniel Birmingham, MD     Patient Profile: Daniel [name]. is a 78 y.o. male with a hx of CAD s/p CABG LIMA to D1, SVG to LAD, SVG to PDA on 05/12/2016, HLD intolerant to statin, HTN, ICM with baseline EF 20-25% via ECHO in 01/2020 s/p  St jude DDD ICD in 04/2019, and h/o prostate CA who is being seen 10/24/2023 for the evaluation of HF at the request of Daniel [name].  History of Present Illness: Daniel [name] last cardiac catheterization on 05/09/2016 showed EF 20-25%, akinesis of the distal inferoapical segment with significant hypokinesis globally consistent with ischemic cardiomyopathy, multivessel disease with 70% followed by total occlusion of large LAD with distal collateral arteries, 50% proximal left circumflex stenosis with total occlusion of distal left circumflex, total occlusion of mid RCA with distal collateral artery. Bypass surgery was recommended and he eventually underwent successful CABG 3 by Dr. Kerrin on 05/12/2016 with LIMA to diagonal, SVG to LAD, SVG to PDA. He developed postoperative atrial fibrillation and was treated with IV amiodarone  with conversion to sinus rhythm prior to discharge. He had a follow-up echocardiogram on 08/05/2016 which revealed LV EF remains low at 30-35% however slightly improved from his previous 20-25%.  He was placed on carvedilol  and Entresto , spironolactone  was discontinued due to elevated potassium level. Unfortunately, he went back to the hospital on 11/20/2016 with shortness breath, diffuse rash and presyncope. He was given prednisone  for his rash. He stopped the Entresto  and restarted ACE inhibitor as he was sure the rash was related to the former.  In 2019, EF  was 25 - 30%.  He could not take the  BiDil. His blood pressure dropped too low. He had a low heart rate and we stopped Coreg .  He did start Zebeta  but it turns out he will only take it if his blood pressure is greater than 140.  He also stop spironolactone  as he thought it was making his heart skip . He now status post ICD since 04/2019. Last EF was 20-25% in 01/2020.   Last seen in heartcare 05/2023 by Dr. Schilling.  Doing well from cardiac standpoint without any complaints.  Noted that LDL was not at target but patient did not want medication titrated.  Otherwise no new recommendations or medication changes.  Continued on ASA 81 mg daily, benazepril  20 mg daily, Farxiga  10 mg daily, Lasix  20 mg twice daily.   Presented to [facility] ED 10/2020 for ongoing chest tightness, productive cough with colored sputum and dyspnea worse over the last 3 days as well as orthopnea  BNP 2556, Tn 43 >33, K3.5 now 3.3, CR 0.98 now 1.01, CBC WNL except platelets 131, negative viral respiratory panel  CXR with findings consistent with mild pulmonary interstitial edema, cardiomegaly, status post CABG, no pleural effusion/pneumothorax. EKG with AV dual paced complex Echo this admission showed EF 20%, + RWMA, moderately dilated LV cavity, mild concentric LVH, indeterminate diastolic parameters, no LV thrombus, moderately reduced RV function, mildly elevated PASP with RVSP at 36.7 mmHg, moderately dilated L/R atrial size, moderate to severe MV regurgitation, mild AV regurgitation, mild calcification of aortic valve, dilated IVC. Treated with IV Lasix  40 mg X1 then 20 mg X1. Suspect inaccurate I/O with +300. Wt 143 >  141. (144 lb in 05/2023)  On interview, patient reports being treated for sinus infection all summer and about 1 month ago seen PCP for productive cough with mucus and then received sinus treatment.  Reports SOB X 3 weeks with longer distances, relieved with rest and not associated with minimal exertion.  Also reports waking up at night with SOB due to drainage  from postnasal drip for X 2 weeks.  Also notes fatigue/weakness in legs with exertion X 1 month.  Also notes lower extremity edema X 1 week.  Reports that he feels his heart rate increase whenever he walks but denies any palpitations.  Denies any weight changes, chest pain, dizziness, syncope.  Currently, reports significant improvement in symptoms.  He has been able to walk around nursing desk and to restroom without any concerns.  Admits to low-sodium diet and drinks approximately 60 ounces of water  daily.  Continues to smoke 3 to 4 cigarettes/day but denies any EtOH or drugs.  Patient lives alone and is able to care for himself.  Patient is able to walk around grocery stores and do yard work without any concerns.  Reports daily medication compliance.  Past Medical History:  Diagnosis Date   Abdominal aortic aneurysm 01/2021   4 cm, infrarenal.  Also enlarged left common iliac artery--vascular eval 03/2021->rpt aortic u/s 06/01/22 stable Aorta 4.2 cm.  Bilat common iliac aneurisms and left common femoral aneurism-->f/u 1 yr   Acute prostatitis 06/19/2013   Anxiety    Atrial fibrillation (HCC)    Limited episode, emergency room, June, 2013, spontaneous conversion to sinus rhythm, was evaluated By Dr. Micky 2013- no further visits.  Post-op (CABG) a-fib, converted on amio but pt self d/c'd this med due to DOE/side effect profile.   Blurred vision 01/01/2020   Bradycardia 06/20/2017   CAD (coronary artery disease) 05/2016   a. 05/2016: NSTEMI with cath showing 3-vessel disease. Underwent CABG on 05/12/16 with LIMA-D1, SVG-LAD, and SVG-PDA.    COPD (chronic obstructive pulmonary disease) (HCC) fall 2016   Bullous changes noted on lower lung images of CT abd done by urology   Diverticulitis    Double vision 01/2020   MG ab w/u NEG.  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 Unable to have MRI due to having shrapnel in forehead.   Hyperlipidemia    statin intolerant. Pt declined advanced lipid clinic referral 08/2019, 03/2020, and 01/2021.   Hypertension    Ischemic cardiomyopathy 05/2016   EF 20-25% at the time of NSTEMI/CABG.  Repeat EF 07/2016 30-35%.  07/2017 EF 25-30%, pt intol of entresto , bidil, and BB--for ICD 03/2019.   Microscopic hematuria Fall 2016   CT nl except nonobstructing stones.  Cystoscopy normal 12/30/14 (Dr. 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  Urol (Dr. Beola) at [facility]; Dr. Beola referred him to Dr. Renda 03/2015: patient got bilat nerve sparing , robot assisted laparoscopic radical prostatectomy and pelvic lymphadenectomy.  Urol f/u, PSA surveillance showing very mild uptrend of PSA but not to the level of biochemical recurrence as of 05/2021 urology follow-up   RLS (restless legs syndrome)    Tobacco dependence    down to 1-2 cigs per day as of 11/2015.  Restarted 08/2016.   Urinary retention 05/2015   occurred s/p foley removal   UTI (urinary tract infection) 11/14/2014    Past Surgical History:  Procedure Laterality Date   aortic ultrasound  07/2012   2014 NO AAA.  01/2021-->4 cm AAA with R common iliac ectasia-->vasc surg ref   COLONOSCOPY  approx 2011 per pt   Done by surgeon in [place] after a bout of diverticulitis; normal per pt report--was told to repeat in 10 yrs.   CORONARY ARTERY BYPASS GRAFT N/A 05/12/2016   Procedure: CORONARY ARTERY BYPASS GRAFTING (CABG) TIMES THREE USING LEFT INTERNAL MAMMARY ARTERY AND RIGHT  SAPHENOUS LEG VEIN HARVESTED ENDOSCOPICALLY;  Surgeon: Elspeth JAYSON Millers, MD;  Location: [facility] OR;  Service: Open Heart Surgery;  Laterality: N/A;   ICD IMPLANT N/A 04/12/2019   Procedure: ICD IMPLANT;  Surgeon: Waddell Daniel ORN, MD;  Location: [facility] INVASIVE CV LAB;  Service: Cardiovascular;  Laterality: N/A;   INGUINAL HERNIA REPAIR  2003 and 2004   Both sides.   LAPAROSCOPY  2017   LEFT HEART CATH AND CORONARY ANGIOGRAPHY N/A 05/09/2016   Procedure: Left Heart Cath and Coronary Angiography;  Surgeon: Debby DELENA Sor, MD;  Location: [facility] INVASIVE CV LAB;  Service: Cardiovascular;  Laterality: N/A;   LITHOTRIPSY  2004   Dr. Verdene in [place].   LYMPHADENECTOMY Bilateral 04/30/2015   Procedure: PELVIC LYMPHADENECTOMY;  Surgeon: Gretel Ferrara, MD;  Location: [facility] ORS;  Service: Urology;  Laterality: Bilateral;   PROSTATE BIOPSY  02/05/2015   Prostate adenocarcinoma: pt considering treatment options as of 02/19/15   ROBOT ASSISTED LAPAROSCOPIC RADICAL PROSTATECTOMY N/A 04/30/2015   Procedure: XI ROBOTIC ASSISTED LAPAROSCOPIC RADICAL PROSTATECTOMY LEVEL 2;  Surgeon: Gretel Ferrara, MD;  Location: [facility] ORS;  Service: Urology;  Laterality: N/A;   TEE WITHOUT CARDIOVERSION N/A 05/12/2016   Procedure: TRANSESOPHAGEAL ECHOCARDIOGRAM (TEE);  Surgeon: Elspeth JAYSON Millers, MD;  Location: [facility] OR;  Service: Open Heart Surgery;  Laterality: N/A;   TRANSTHORACIC ECHOCARDIOGRAM  03/22/2006; 05/2016; 07/2016; 07/2017; 01/02/20   2008: EF 65%, normal valves, no wall motion abnormalties, LA size normal.  05/2016 in context of non-STEMI, EF 20-25%, mild AV and MV regurg.  08/05/16: EF 30-35%, akinesis of mid lateral myoc, grd II DD, mild aortic 07/2017: EF 25-30%, mult areas of akinesis, grd I DD. 01/2020 EF 25-30%, sev LV dsfxn, valves fine, aortic root 40mm     Home Medications:  Prior to Admission medications   Medication Sig Start Date End Date Taking? Authorizing Provider  ALPRAZolam  (XANAX ) 0.25 MG tablet tAKE 1 to 2 tabLETS BY  MOUTH AT BEDTIME AS NEEDED FOR insomnia/restless legs Patient taking differently: Take 0.5-2 tablets by mouth at bedtime as needed for sleep. TAKE 1 to 2 tabLETS BY MOUTH AT BEDTIME AS NEEDED FOR insomnia/restless legs 10/18/23  Yes McGowen, Aleene DEL, MD  aspirin  EC 81 MG tablet Take 1 tablet (81 mg total) by mouth daily. Swallow whole. 02/05/20  Yes Lavona Agent, MD  benazepril  (LOTENSIN ) 20 MG tablet Take 20 mg by mouth in the morning and at bedtime. To keep BP Levels at 140/80. If levels are lower than this, patient only takes one-half tablet   Yes [provider]  dapagliflozin  propanediol (FARXIGA ) 10 MG  TABS tablet Take 10 mg by mouth daily.   Yes [provider]  furosemide  (LASIX ) 20 MG tablet Take 20 mg by mouth 2 (two) times daily.   Yes [provider]  levocetirizine (XYZAL) 5 MG tablet Take 5 mg by mouth every evening. 07/31/23  Yes [provider]  montelukast (SINGULAIR) 10 MG tablet Take 10 mg by mouth daily. 10/18/23  Yes [provider]  omeprazole (PRILOSEC) 20 MG capsule Take 20 mg by mouth daily as needed (for acid reflux). 07/31/23  Yes [provider]  Cetirizine  HCl (ZYRTEC  ALLERGY) 10 MG CAPS Take 1 capsule (10 mg total) by mouth daily. 11/06/14 11/06/14  Lenor Hollering, MD    Scheduled Meds:  aspirin  EC  81 mg Oral Q breakfast   azithromycin   500 mg Oral Daily   furosemide   20 mg Intravenous QPM   furosemide   40 mg Intravenous Daily   heparin   5,000 Units Subcutaneous Q8H   ipratropium-albuterol   3 mL Nebulization TID   nicotine   14 mg Transdermal Daily   predniSONE   50 mg Oral Q breakfast   sodium chloride  flush  3 mL Intravenous Q12H   sodium chloride  flush  3 mL Intravenous Q12H   Continuous Infusions:  sodium chloride      PRN Meds: sodium chloride , acetaminophen  **OR** acetaminophen , albuterol , ALPRAZolam , bisacodyl , ondansetron  **OR** ondansetron  (ZOFRAN ) IV, polyethylene glycol, sodium chloride   flush  Allergies:    Allergies  Allergen Reactions   Entresto  [Sacubitril -Valsartan ] Hives and Shortness Of Breath   Amlodipine  Swelling    LE swelling   Contrast Media [Iodinated Contrast Media] Other (See Comments)    Prostate problem had to wear a catheter for two weeks after having dye.    Hydralazine  Palpitations and Other (See Comments)    Fatigue+    Social History:   Social History   Socioeconomic History   Marital status: Married    Spouse name: Not on file   Number of children: Not on file   Years of education: Not on file   Highest education level: Not on file  Occupational History   Not on file  Tobacco Use   Smoking status: Some Days    Current packs/day: 1.00    Average packs/day: 1 pack/day for 55.0 years (55.0 ttl pk-yrs)    Types: Cigarettes   Smokeless tobacco: Never   Tobacco comments:    quit in 2017  Vaping Use   Vaping status: Never Used  Substance and Sexual Activity   Alcohol use: No   Drug use: No   Sexual activity: Not on file  Other Topics Concern   Not on file  Social History Narrative   Married, 2 grown children.   HS education.  Orig from [place], lives there now.   Occupation: maintenance.  Drives a tractor a lot, drives a pickup truck a lot, rides horses a lot.   Tobacco: 20 pack-yr hx (current as of 07/2012)   No drugs.   Alcohol: none   Family History:   Family History  Problem Relation Age of Onset   Hypertension Mother    Cancer - Other Mother 64       breast   Cancer Father        Lung ca age 48   Diabetes Sister      ROS:  Please see the history of present illness.   All other ROS reviewed and negative.     Physical Exam/Data: Vitals:   10/23/23 2259 10/24/23 0258 10/24/23  0500 10/24/23 0718  BP: 114/74 116/70    Pulse: 75 73    Resp: 16 18    Temp: (!) 97.3 F (36.3 C) (!) 97.4 F (36.3 C)    TempSrc: Oral Oral    SpO2: 96% 97%  98%  Weight:   64.1 kg   Height:       No intake or output data in the  24 hours ending 10/24/23 0819    10/24/2023    5:00 AM 10/23/2023    7:42 AM 09/13/2023    9:32 AM  Last 3 Weights  Weight (lbs) 141 lb 5 oz 143 lb 15.4 oz 144 lb  Weight (kg) 64.1 kg 65.3 kg 65.318 kg     Body mass index is 20.28 kg/m.  General:  Well nourished, well developed, in no acute distress HEENT: normal Neck: no JVD Vascular: No carotid bruits; Distal pulses 2+ bilaterally Cardiac:  normal S1, S2; RRR; no murmur  Lungs:  wheezing throughout lungs Abd: soft, nontender, no hepatomegaly  Ext: no edema Musculoskeletal:  No deformities, BUE and BLE strength normal and equal Skin: warm and dry  Neuro:  CNs 2-12 intact, no focal abnormalities noted Psych:  Normal affect   EKG:  The EKG was personally reviewed and demonstrates:  AV dual paced complexes, HR 71 with PVCs Telemetry:  Telemetry was personally reviewed and demonstrates:  AV dual paced, Hr 70's  Relevant CV Studies: Cath 05/2016  ? Mid RCA lesion, 100 %stenosed. ? Dist Cx lesion, 100 %stenosed. ? Ost Cx to Prox Cx lesion, 50 %stenosed. ? LM lesion, 30 %stenosed. ? Prox LAD to Mid LAD lesion, 100 %stenosed. ? The left ventricular ejection fraction is less than 25% by visual estimate. ? LV end diastolic pressure is normal. ? There is severe left ventricular systolic dysfunction.   Severe LV dysfunction with an ejection fraction of 20-25%. There was akinesis of the distal inferoapical segment with significant hypokinesis globally c/w an ischemic cardiomyopathy.   Severe multivessel CAD with 30% near ostial stenosis, 75% proximal, and total occlusion of a large LAD with distal collateralization; 50% proximal left circumflex stenoses with total occlusion of the distal circumflex; and chronic total occlusion of the mid RCA with collateralization distally via the RV marginal branch as well as via the left coronary injection.   RECOMMENDATION: Surgical consultation for CABG revascularization.     ECHO 10/23/2023  IMPRESSIONS  1. Left ventricular ejection fraction, by estimation, is approximately  20%. The left ventricle has severely decreased function. The left  ventricle demonstrates regional wall motion abnormalities (see scoring  diagram/findings for description). The left  ventricular internal cavity size was moderately dilated. There is mild  concentric left ventricular hypertrophy. Left ventricular diastolic  parameters are indeterminate.   2. No formed LV mural thrombus evident by Definity  contrast.   3. Right ventricular systolic function is moderately reduced. The right  ventricular size is normal. There is mildly elevated pulmonary artery  systolic pressure. The estimated right ventricular systolic pressure is  36.7 mmHg.   4. Left atrial size was moderately dilated.   5. Right atrial size was moderately dilated.   6. The mitral valve is degenerative. Moderate to severe mitral valve  regurgitation.   7. The aortic valve is tricuspid. There is mild calcification of the  aortic valve. Aortic valve regurgitation is mild. Aortic regurgitation PHT  measures 846 msec.   8. The inferior vena cava is dilated in size with >50% respiratory  variability, suggesting right  atrial pressure of 8 mmHg.   Comparison(s): Prior images reviewed side by side. LVEF approximately 20%  with wall motion abnormalties consistent with ischemic cardiomyopathy.  Moderate to severe mitral regurgitation.   Laboratory Data: High Sensitivity Troponin:   Recent Labs  Lab 10/23/23 0745 10/23/23 1012  TROPONINIHS 43* 33*     Chemistry Recent Labs  Lab 10/23/23 0745 10/24/23 0440  NA 139 139  K 3.5 3.3*  CL 101 101  CO2 27 26  GLUCOSE 95 112*  BUN 22 27*  CREATININE 0.98 1.01  CALCIUM  9.1 8.9  GFRNONAA >60 >60  ANIONGAP 11 12    Recent Labs  Lab 10/23/23 0745  PROT 6.4*  ALBUMIN  3.7  AST 28  ALT 20  ALKPHOS 65  BILITOT 1.6*   Lipids No results for input(s): CHOL, TRIG, HDL, LABVLDL,  LDLCALC, CHOLHDL in the last 168 hours.  Hematology Recent Labs  Lab 10/23/23 0745  WBC 4.8  RBC 5.13  HGB 16.5  HCT 49.5  MCV 96.5  MCH 32.2  MCHC 33.3  RDW 14.7  PLT 131*   Thyroid  No results for input(s): TSH, FREET4 in the last 168 hours.  BNP Recent Labs  Lab 10/23/23 0752  BNP 2,576.0*    DDimer No results for input(s): DDIMER in the last 168 hours.  Radiology/Studies:  DG Chest Port 1 View Result Date: 10/23/2023 EXAM: 1 VIEW XRAY OF THE CHEST 10/23/2023 08:10:00 AM COMPARISON: 09/06/2023 CLINICAL HISTORY: Pt c/o chest tightness starting about three days ago. Pt states having a congested cough. Pt states he has had cough for over a month and had a chest xray done by PCP. Pt has Abbot pacemaker. Pt states increased SHOB over the past week. FINDINGS: LUNGS AND PLEURA: Mild interstitial thickening with peripheral septal markings identified compatible with mild edema. No signs of significant pleural effusion, pneumothorax or consolidative change. HEART AND MEDIASTINUM: Persistent cardiomegaly. CABG noted. Left chest pacemaker/AICD with leads overlying right atrium and right ventricle. Median sternotomy wires noted. BONES AND SOFT TISSUES: No acute osseous abnormality. IMPRESSION: 1. Findings consistent with mild pulmonary interstitial edema; no pleural effusion or pneumothorax. 2. Cardiomegaly.  Status post CABG. Electronically signed by: Waddell Calk MD 10/23/2023 08:23 AM EDT RP Workstation: [id number]    Assessment and Plan: Acute on chronic HFrEF Presented with DOE, productive cough, orthopnea, and LE edema.  BNP 2556, TN 43>33.  CXR consistent with mild pulmonary interstitial edema.  Echo this admission showed EF 20%, + RWMA, moderately dilated LV cavity, mild concentric LVH, indeterminate diastolic parameters, no LV thrombus, moderately reduced RV function, mildly elevated PASP with RVSP at 36.7 mmHg, moderately dilated L/R atrial size, moderate to severe MV  regurgitation, mild AV regurgitation, mild calcification of aortic valve, dilated IVC. Treated with IV Lasix  40 mg X1 then 20 mg X1. Suspect inaccurate I/O with +300. Wt 143 >141. Patient reports significant urine output but has not been using urinal to document output. Nurse will get patient urinal.   Currently, reports significant improvement in symptoms.  He has been able to walk around nursing desk and to restroom without any concerns. Appears euvolemic on exam with wheezing throughout the lungs. Order Reds Clip to assess volume status.  Continue IV lasix  for today. Based on Red clips results, can switch to PO.  Can resume home Farxiga  10 mg daily.  Continue to hold Benazepril  for diuresis. May consider restarting alternative ACEI.  GDMT limited. Previously discontinued from carvedilol  (bradycardia), spironolactone  (hyperkalemia, heart skip beat), Entresto  (rash)  due to intolerance.  Per Dr. Debera, can consider cardioselective BB, bisoprolol  for additional GDMT based on BP measurements.  Patient has been not seen heart failure team since 12/2021.  Will schedule follow-up. Although EF is lower at 20% and BNP elevated, suspect that COPD and tobacco use are contributing factors as well since also receiving COPD treatment.   CAD s/p CABG LIMA to D1, SVG to LAD, SVG to PDA on 05/12/2016  TN 43>33 EKG with AV dual paced complexes Denies any anginal symptoms.  No need for ischemic evaluation at this time. Continue ASA 81 mg daily Previously noted intolerant to statins.  HTN BP well-controlled 116/70 at 3 AM.  Will contact nurse to repeat BP.  Per Dr. Debera, can consider cardioselective BB, bisoprolol  for additional GDMT based on BP measurements.  Benazepril  currently on hold to allow for aggressive diuresis as above. Continue to hold. May consider restarting alternative ACEI.   Ischemic cardiomyopathy ICD in place s/p  St jude DDD ICD in 04/2019 Device interrogation 10/06/2023 showed AV  dual place with appropriate programming and no arrhythmias noted. Continue to follow EP.  Moderate to severe MV regurgitation Already scheduled for follow-up with valvular team on 10/3  AAA He has a 42 mm infrarenal abdominal aneurysm and aneurysmal dilatation of the common iliacs. He is followed by Dr. Sheree.    Risk Assessment/Risk Scores:  New [place]  Heart Association (NYHA) Functional Class NYHA Class II   For questions or updates, please contact [facility] Please consult [email or web address] for contact info under      Signed, Lorette CINDERELLA Kapur, PA-C  10/24/2023 8:19 AM

## 2023-10-24 NOTE — Progress Notes (Addendum)
 PROGRESS NOTE  Daniel [name], is a 78 y.o. male, DOB - 21-Jun-1945, FMW:[id number]  Admit date - 10/23/2023   Admitting Physician Tashya Alberty Pearlean, MD  Outpatient Primary MD for the patient is McGowen, Aleene DEL, MD  LOS - 1  Chief Complaint  Patient presents with   Shortness of Breath      Brief Narrative:  78 y.o. male with medical history significant for chronic systolic heart failure status post ICD placement and pacemaker, history of CAD status post CABG (CAD s/p CABG LIMA to D1, SVG to LAD, SVG to PDA on 05/12/2016, ), hypertension, COPD, history of A. fib briefly after CABG ,  ICM with baseline EF 30-35% (now down to 20 % on 10/23/23), and h/o prostate CA admitted on 10/24/2023 with acute exacerbation of chronic systolic dysfunction CHF    -Assessment and Plan: 1) acute exacerbation of Chronic systolic dysfunction CHF--POA Repeat/updated echo from 10/23/23 with LVEF approximately 20% with wall motion abnormalties consistent with ischemic cardiomyopathy and Moderate to severe mitral regurgitation. -His EF was previously as high as 30 to 35% -- On admission chest x-ray suggest  status post CABG and pulmonary interstitial edema - On admission BNP 2556 much higher than previous - Hold benazepril  to allow BP room for aggressive diuresis 10/24/23 -wt 143.9 >>141 - Okay to add Farxiga  back again -Patient was receiving Lasix  40 in the morning and 20 daily okay to reduce Lasix  to 20 mg IV twice daily -Cardiology consult appreciated -GDMT for HFrEF has been limited. - He has prior allergy to Entresto , previous bradycardia on Coreg , and hyperkalemia on spironolactone .  - Cardiology team may add bisoprolol  as outpatient (patient now has ICD/pacemaker)-- so  bradycardia is no longer a concern -Daily weight strict input and output monitoring   2)CAD s/p CABG LIMA to D1, SVG to LAD, SVG to PDA on 05/12/2016- Tropinin 43 >> 33, EKG with atrial ventricular dual paced complexes. -- Continue aspirin  -  Patient apparently is intolerant to statins,    3)Hypertension: Hold benazepril  to give room to allow for aggressive diuresis as above #1   4) history of ischemic cardiomyopathy/status post ICD:- --Continue to follow-up with Dr. 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  He is followed by Dr. Sheree.     6)COPD/Tobacco abuse--Mild COPD excerb --smoking cessation advised, 10/24/23 -- Cough dyspnea and wheezing improving--- c/n prednisone , azithro amd bronchiodilators as ordered-  7)Hypokalemia----due to diuresis - Replace potassium  Status is: Inpatient   Disposition: The patient is from: Home              Anticipated d/c is to: Home              Anticipated d/c date is: 1 day              Patient currently is not medically stable to d/c. Barriers: Not Clinically Stable-   Code Status :  -  Code Status: Full Code   Family Communication:    NA (patient is alert, awake and coherent)   DVT Prophylaxis  :   - SCDs   heparin  injection 5,000 Units Start: 10/23/23 2200 SCDs Start: 10/23/23 1237 Place TED hose Start: 10/23/23 1237   Lab Results  Component Value Date   PLT 131 (L) 10/23/2023    Inpatient Medications  Scheduled Meds:  aspirin  EC  81 mg Oral Q breakfast   azithromycin   500 mg Oral Daily  dapagliflozin  propanediol  10 mg Oral Daily   furosemide   20 mg Intravenous BID   heparin   5,000 Units Subcutaneous Q8H   ipratropium-albuterol   3 mL Nebulization TID   nicotine   14 mg Transdermal Daily   potassium chloride   40 mEq Oral Q3H   predniSONE   50 mg Oral Q breakfast   sodium chloride  flush  3 mL Intravenous Q12H   sodium chloride  flush  3 mL Intravenous Q12H   Continuous Infusions:  sodium chloride      PRN Meds:.sodium chloride , acetaminophen  **OR** acetaminophen , albuterol , ALPRAZolam , bisacodyl , ondansetron  **OR** ondansetron  (ZOFRAN ) IV, polyethylene glycol, sodium chloride   flush   Anti-infectives (From admission, onward)    Start     Dose/Rate Route Frequency Ordered Stop   10/23/23 1400  azithromycin  (ZITHROMAX ) tablet 500 mg        500 mg Oral Daily 10/23/23 1238 10/28/23 0959      Subjective: Daniel [name] today has no fevers, no emesis,  No chest pain,   - Cough improving  - Voiding well -no conversational dyspnea, -Dyspnea at rest improving, some dyspnea on exertion persist  Objective: Vitals:   10/23/23 2259 10/24/23 0258 10/24/23 0500 10/24/23 0718  BP: 114/74 116/70    Pulse: 75 73    Resp: 16 18    Temp: (!) 97.3 F (36.3 C) (!) 97.4 F (36.3 C)    TempSrc: Oral Oral    SpO2: 96% 97%  98%  Weight:   64.1 kg   Height:        Intake/Output Summary (Last 24 hours) at 10/24/2023 1158 Last data filed at 10/24/2023 0800 Gross per 24 hour  Intake 300 ml  Output --  Net 300 ml   Filed Weights   10/23/23 0742 10/24/23 0500  Weight: 65.3 kg 64.1 kg    Physical Exam  Gen:- Awake Alert, no conversational dyspnea, some dyspnea on exertion persist HEENT:- .AT, No sclera icterus Neck-Supple Neck,No JVD,.  Lungs-improving air movement, no further wheezing  CV- S1, S2 normal, regular ,  prior CABG scar, AICD/pacemaker in situ  Abd-  +ve B.Sounds, Abd Soft, No tenderness,    Extremity/Skin:-Improving Lower extremity pitting edema, pedal pulses present  Psych-affect is appropriate, oriented x3 Neuro-no new focal deficits, no tremors  Data Reviewed: I have personally reviewed following labs and imaging studies  CBC: Recent Labs  Lab 10/23/23 0745  WBC 4.8  HGB 16.5  HCT 49.5  MCV 96.5  PLT 131*   Basic Metabolic Panel: Recent Labs  Lab 10/23/23 0745 10/24/23 0440  NA 139 139  K 3.5 3.3*  CL 101 101  CO2 27 26  GLUCOSE 95 112*  BUN 22 27*  CREATININE 0.98 1.01  CALCIUM  9.1 8.9   GFR: Estimated Creatinine Clearance: 54.7 mL/min (by C-G formula based on SCr of 1.01 mg/dL). Liver Function Tests: Recent Labs  Lab  10/23/23 0745  AST 28  ALT 20  ALKPHOS 65  BILITOT 1.6*  PROT 6.4*  ALBUMIN  3.7   Recent Labs    09/06/23 1134 09/13/23 0933  PROBNP 3,886.0* 2,021.0*   Recent Results (from the past 240 hours)  Resp panel by RT-PCR (RSV, Flu A&B, Covid) Anterior Nasal Swab     Status: None   Collection Time: 10/23/23  8:10 AM   Specimen: Anterior Nasal Swab  Result Value Ref Range Status   SARS Coronavirus 2 by RT PCR NEGATIVE NEGATIVE Final    Comment: (NOTE) SARS-CoV-2 target nucleic acids are NOT DETECTED.  The SARS-CoV-2  RNA is generally detectable in upper respiratory specimens during the acute phase of infection. The lowest concentration of SARS-CoV-2 viral copies this assay can detect is 138 copies/mL. A negative result does not preclude SARS-Cov-2 infection and should not be used as the sole basis for treatment or other patient management decisions. A negative result may occur with  improper specimen collection/handling, submission of specimen other than nasopharyngeal swab, presence of viral mutation(s) within the areas targeted by this assay, and inadequate number of viral copies(<138 copies/mL). A negative result must be combined with clinical observations, patient history, and epidemiological information. The expected result is Negative.  Fact Sheet for Patients:  BloggerCourse.com  Fact Sheet for Healthcare Providers:  SeriousBroker.it  This test is no t yet approved or cleared by the [place]  FDA and  has been authorized for detection and/or diagnosis of SARS-CoV-2 by FDA under an Emergency Use Authorization (EUA). This EUA will remain  in effect (meaning this test can be used) for the duration of the COVID-19 declaration under Section 564(b)(1) of the Act, 21 U.S.C.section 360bbb-3(b)(1), unless the authorization is terminated  or revoked sooner.       Influenza A by PCR NEGATIVE NEGATIVE Final   Influenza B by  PCR NEGATIVE NEGATIVE Final    Comment: (NOTE) The Xpert Xpress SARS-CoV-2/FLU/RSV plus assay is intended as an aid in the diagnosis of influenza from Nasopharyngeal swab specimens and should not be used as a sole basis for treatment. Nasal washings and aspirates are unacceptable for Xpert Xpress SARS-CoV-2/FLU/RSV testing.  Fact Sheet for Patients: BloggerCourse.com  Fact Sheet for Healthcare Providers: SeriousBroker.it  This test is not yet approved or cleared by the [place]  FDA and has been authorized for detection and/or diagnosis of SARS-CoV-2 by FDA under an Emergency Use Authorization (EUA). This EUA will remain in effect (meaning this test can be used) for the duration of the COVID-19 declaration under Section 564(b)(1) of the Act, 21 U.S.C. section 360bbb-3(b)(1), unless the authorization is terminated or revoked.     Resp Syncytial Virus by PCR NEGATIVE NEGATIVE Final    Comment: (NOTE) Fact Sheet for Patients: BloggerCourse.com  Fact Sheet for Healthcare Providers: SeriousBroker.it  This test is not yet approved or cleared by the [place]  FDA and has been authorized for detection and/or diagnosis of SARS-CoV-2 by FDA under an Emergency Use Authorization (EUA). This EUA will remain in effect (meaning this test can be used) for the duration of the COVID-19 declaration under Section 564(b)(1) of the Act, 21 U.S.C. section 360bbb-3(b)(1), unless the authorization is terminated or revoked.  Performed at [facility], [address], [place], [place] [address]      Radiology Studies: ECHOCARDIOGRAM COMPLETE Result Date: 10/23/2023    ECHOCARDIOGRAM REPORT   Patient Name:   Daniel [name]. Date of Exam: 10/23/2023 Medical Rec #:  [id number]        Height:       70.0 in Accession #:    [id number]       Weight:       144.0 lb Date of Birth:  06-07-1945         BSA:           1.815 m Patient Age:    78 years         BP:           132/99 mmHg Patient Gender: M                HR:  126 bpm. Exam Location:  [facility] Procedure: 2D Echo, 3D Echo, Cardiac Doppler, Color Doppler, Strain Analysis and            Intracardiac Opacification Agent (Both Spectral and Color Flow            Doppler were utilized during procedure). Indications:    CHF  History:        Patient has prior history of Echocardiogram examinations, most                 recent 01/02/2020. CHF, CAD, Defibrillator and Prior CABG, COPD;                 Risk Factors:Hypertension and Dyslipidemia.  Sonographer:    Philomena Daring Referring Phys: [id number] Zani Kyllonen  Sonographer Comments: Global longitudinal strain was attempted. IMPRESSIONS  1. Left ventricular ejection fraction, by estimation, is approximately 20%. The left ventricle has severely decreased function. The left ventricle demonstrates regional wall motion abnormalities (see scoring diagram/findings for description). The left ventricular internal cavity size was moderately dilated. There is mild concentric left ventricular hypertrophy. Left ventricular diastolic parameters are indeterminate.  2. No formed LV mural thrombus evident by Definity  contrast.  3. Right ventricular systolic function is moderately reduced. The right ventricular size is normal. There is mildly elevated pulmonary artery systolic pressure. The estimated right ventricular systolic pressure is 36.7 mmHg.  4. Left atrial size was moderately dilated.  5. Right atrial size was moderately dilated.  6. The mitral valve is degenerative. Moderate to severe mitral valve regurgitation.  7. The aortic valve is tricuspid. There is mild calcification of the aortic valve. Aortic valve regurgitation is mild. Aortic regurgitation PHT measures 846 msec.  8. The inferior vena cava is dilated in size with >50% respiratory variability, suggesting right atrial pressure of 8 mmHg. Comparison(s): Prior  images reviewed side by side. LVEF approximately 20% with wall motion abnormalties consistent with ischemic cardiomyopathy. Moderate to severe mitral regurgitation. FINDINGS  Left Ventricle: Left ventricular ejection fraction, by estimation, is 20%. The left ventricle has severely decreased function. The left ventricle demonstrates regional wall motion abnormalities. Definity  contrast agent was given IV to delineate the left  ventricular endocardial borders. The left ventricular internal cavity size was moderately dilated. There is mild concentric left ventricular hypertrophy. Left ventricular diastolic parameters are indeterminate.  LV Wall Scoring: The entire inferior wall, mid inferolateral segment, mid inferoseptal segment, apical septal segment, apical anterior segment, and apex are akinetic. The anterior wall, antero-lateral wall, anterior septum, basal inferolateral segment, apical lateral segment, and basal inferoseptal segment are hypokinetic. Right Ventricle: The right ventricular size is normal. No increase in right ventricular wall thickness. Right ventricular systolic function is moderately reduced. There is mildly elevated pulmonary artery systolic pressure. The tricuspid regurgitant velocity is 2.68 m/s, and with an assumed right atrial pressure of 8 mmHg, the estimated right ventricular systolic pressure is 36.7 mmHg. Left Atrium: Left atrial size was moderately dilated. Right Atrium: Right atrial size was moderately dilated. Pericardium: There is no evidence of pericardial effusion. Mitral Valve: The mitral valve is degenerative in appearance. Moderate to severe mitral valve regurgitation, with posteriorly-directed jet. Tricuspid Valve: The tricuspid valve is grossly normal. Tricuspid valve regurgitation is mild. Aortic Valve: The aortic valve is tricuspid. There is mild calcification of the aortic valve. There is mild aortic valve annular calcification. Aortic valve regurgitation is mild. Aortic  regurgitation PHT measures 846 msec. Pulmonic Valve: The pulmonic valve was grossly normal. Pulmonic valve regurgitation  is trivial. Aorta: The aortic root and ascending aorta are structurally normal, with no evidence of dilitation. Venous: The inferior vena cava is dilated in size with greater than 50% respiratory variability, suggesting right atrial pressure of 8 mmHg. IAS/Shunts: No atrial level shunt detected by color flow Doppler. Additional Comments: 3D was performed not requiring image post processing on an independent workstation and was indeterminate. A device lead is visualized.  LEFT VENTRICLE PLAX 2D LVIDd:         6.60 cm   Diastology LVIDs:         6.30 cm   LV e' medial:    5.66 cm/s LV PW:         1.00 cm   LV E/e' medial:  12.9 LV IVS:        1.10 cm   LV e' lateral:   9.03 cm/s LVOT diam:     2.40 cm   LV E/e' lateral: 8.1 LV SV:         43 LV SV Index:   24 LVOT Area:     4.52 cm                           3D Volume EF:                          LV EDV:       330 ml                          LV ESV:       266 ml                          LV SV:        65 ml RIGHT VENTRICLE            IVC RV Basal diam:  3.70 cm    IVC diam: 2.90 cm RV S prime:     5.87 cm/s TAPSE (M-mode): 1.3 cm LEFT ATRIUM             Index        RIGHT ATRIUM           Index LA diam:        4.30 cm 2.37 cm/m   RA Area:     22.30 cm LA Vol (A2C):   78.7 ml 43.36 ml/m  RA Volume:   79.90 ml  44.02 ml/m LA Vol (A4C):   70.8 ml 39.01 ml/m LA Biplane Vol: 77.3 ml 42.59 ml/m  AORTIC VALVE             PULMONIC VALVE LVOT Vmax:   73.90 cm/s  PR End Diast Vel: 15.84 msec LVOT Vmean:  44.600 cm/s LVOT VTI:    0.095 m AI PHT:      846 msec  AORTA Ao Root diam: 3.70 cm Ao Asc diam:  3.50 cm MITRAL VALVE               TRICUSPID VALVE MV Area (PHT): 3.99 cm    TR Peak grad:   28.7 mmHg MV Decel Time: 190 msec    TR Vmax:        268.00 cm/s MR Peak grad: 91.8 mmHg MR Mean grad: 62.0 mmHg    SHUNTS MR Vmax:      479.00 cm/s  Systemic VTI:  0.10 m MR Vmean:     377.0 cm/s   Systemic Diam: 2.40 cm MV E velocity: 73.20 cm/s MV A velocity: 37.90 cm/s MV E/A ratio:  1.93 Jayson Sierras MD Electronically signed by Jayson Sierras MD Signature Date/Time: 10/23/2023/4:59:46 PM    Final    DG Chest Port 1 View Result Date: 10/23/2023 EXAM: 1 VIEW XRAY OF THE CHEST 10/23/2023 08:10:00 AM COMPARISON: 09/06/2023 CLINICAL HISTORY: Pt c/o chest tightness starting about three days ago. Pt states having a congested cough. Pt states he has had cough for over a month and had a chest xray done by PCP. Pt has Abbot pacemaker. Pt states increased SHOB over the past week. FINDINGS: LUNGS AND PLEURA: Mild interstitial thickening with peripheral septal markings identified compatible with mild edema. No signs of significant pleural effusion, pneumothorax or consolidative change. HEART AND MEDIASTINUM: Persistent cardiomegaly. CABG noted. Left chest pacemaker/AICD with leads overlying right atrium and right ventricle. Median sternotomy wires noted. BONES AND SOFT TISSUES: No acute osseous abnormality. IMPRESSION: 1. Findings consistent with mild pulmonary interstitial edema; no pleural effusion or pneumothorax. 2. Cardiomegaly.  Status post CABG. Electronically signed by: Waddell Calk MD 10/23/2023 08:23 AM EDT RP Workstation: [id number]   Scheduled Meds:  aspirin  EC  81 mg Oral Q breakfast   azithromycin   500 mg Oral Daily   dapagliflozin  propanediol  10 mg Oral Daily   furosemide   20 mg Intravenous BID   heparin   5,000 Units Subcutaneous Q8H   ipratropium-albuterol   3 mL Nebulization TID   nicotine   14 mg Transdermal Daily   potassium chloride   40 mEq Oral Q3H   predniSONE   50 mg Oral Q breakfast   sodium chloride  flush  3 mL Intravenous Q12H   sodium chloride  flush  3 mL Intravenous Q12H   Continuous Infusions:  sodium chloride      LOS: 1 day   Rendall Carwin M.D on 10/24/2023 at 11:58 AM  Go to [email or web address] - for contact info  [facility]  - Office  [phone]  If 7PM-7AM, please contact night-coverage [email or web address] 10/24/2023, 11:58 AM

## 2023-10-24 NOTE — Plan of Care (Signed)

## 2023-10-24 NOTE — Progress Notes (Signed)
 Transition of Care Department [facility]) has reviewed patient and no other TOC needs have been identified at this time. We will continue to monitor patient advancement through interdisciplinary progression rounds. If new patient transition needs arise, please place a TOC consult.   10/24/23 0740  TOC Brief Assessment  Insurance and Status Reviewed  Patient has primary care physician Yes  Home environment has been reviewed Lives with wife.  Prior level of function: Independent.  Prior/Current Home Services No current home services  Social Drivers of Health Review SDOH reviewed no interventions necessary  Readmission risk has been reviewed Yes  Transition of care needs no transition of care needs at this time

## 2023-10-24 NOTE — Progress Notes (Signed)
 Heart Failure Nurse Navigator Progress Note  PCP: Candise Aleene DEL, MD PCP-Cardiologist: Daniel Schilling, MD Electrophysiologist: Danelle Birmingham, MD Admission Diagnosis:  Acute on chronic combined systolic and diastolic CHF (congestive heart failure) (HCC) Shortness of breath Wheezing  Tobacco use Elevated brain natriuretic peptide (BNP) Elevated troponin Thrombocytopenia [facility]) Admitted from: [facility]; Reached out to patient remotely in room 314 at [facility].  Confirmed patient identity using 2 patient identifiers prior to interview and Heart Failure education.  Presentation:   Daniel [name]. 78 y.o.male. History of CAD s/p CABG, Left chest AICD HLD, intolerant to statin, HTN, GERD, Prostate CA.   He presented with shortness of breath for over the past 2-3 days. Chest felt tight.  Also c/o congested cough with whitish phlegm.  Patient is a smoker. Chronic swelling to bilateral lower legs.  BNP 2,576.  HS-Troponin 43 and 33. Chest x-ray: Pulmonary interstitial edema, Cardiomegaly.  ECHO/ LVEF: 20%  Clinical Course:  Past Medical History:  Diagnosis Date   Abdominal aortic aneurysm 01/2021   4 cm, infrarenal.  Also enlarged left common iliac artery--vascular eval 03/2021->rpt aortic u/s 06/01/22 stable Aorta 4.2 cm.  Bilat common iliac aneurisms and left common femoral aneurism-->f/u 1 yr   Acute prostatitis 06/19/2013   Anxiety    Atrial fibrillation (HCC)    Limited episode, emergency room, June, 2013, spontaneous conversion to sinus rhythm, was evaluated By Dr. Micky 2013- no further visits.  Post-op (CABG) a-fib, converted on amio but pt self d/c'd this med due to DOE/side effect profile.   Blurred vision 01/01/2020   Bradycardia 06/20/2017   CAD (coronary artery disease) 05/2016   a. 05/2016: NSTEMI with cath showing 3-vessel disease. Underwent CABG on 05/12/16 with LIMA-D1, SVG-LAD, and SVG-PDA.    COPD (chronic obstructive pulmonary disease) (HCC) fall  2016   Bullous changes noted on lower lung images of CT abd done by urology   Diverticulitis    Double vision 01/2020   MG ab w/u NEG.  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 Unable to have MRI due to having shrapnel in forehead.   Hyperlipidemia    statin intolerant. Pt declined advanced lipid clinic referral 08/2019, 03/2020, and 01/2021.   Hypertension    Ischemic cardiomyopathy 05/2016   EF 20-25% at the time of NSTEMI/CABG.  Repeat EF 07/2016 30-35%.  07/2017 EF 25-30%, pt intol of entresto , bidil, and BB--for ICD 03/2019.   Microscopic hematuria Fall 2016   CT nl except nonobstructing stones.  Cystoscopy normal 12/30/14 (Dr. 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  Urol (Dr. Beola) at [facility]; Dr. Beola referred him to Dr. Renda 03/2015: patient got bilat nerve sparing , robot assisted laparoscopic radical prostatectomy and pelvic lymphadenectomy.  Urol f/u, PSA surveillance showing very mild uptrend of PSA but not to the level of biochemical recurrence as of 05/2021 urology follow-up   RLS (restless legs syndrome)  Tobacco dependence    down to 1-2 cigs per day as of 11/2015.  Restarted 08/2016.   Urinary retention 05/2015   occurred s/p foley removal   UTI (urinary tract infection) 11/14/2014     Social History   Socioeconomic History   Marital status: Married    Spouse name: Not on file   Number of children: Not on file   Years of  education: Not on file   Highest education level: Not on file  Occupational History   Not on file  Tobacco Use   Smoking status: Some Days    Current packs/day: 1.00    Average packs/day: 1 pack/day for 55.0 years (55.0 ttl pk-yrs)    Types: Cigarettes   Smokeless tobacco: Never   Tobacco comments:    quit in 2017  Vaping Use   Vaping status: Never Used  Substance and Sexual Activity   Alcohol use: No   Drug use: No   Sexual activity: Not on file  Other Topics Concern   Not on file  Social History Narrative   Married, 2 grown children.   HS education.  Orig from [place], lives there now.   Occupation: maintenance.  Drives a tractor a lot, drives a pickup truck a lot, rides horses a lot.   Tobacco: 20 pack-yr hx (current as of 07/2012)   No drugs.   Alcohol: none   Social Drivers of Corporate investment banker Strain: Low Risk  (09/29/2021)   Overall Financial Resource Strain (CARDIA)    Difficulty of Paying Living Expenses: Not hard at all  Food Insecurity: No Food Insecurity (10/23/2023)   Hunger Vital Sign    Worried About Running Out of Food in the Last Year: Never true    Ran Out of Food in the Last Year: Never true  Transportation Needs: No Transportation Needs (10/23/2023)   PRAPARE - Administrator, Civil Service (Medical): No    Lack of Transportation (Non-Medical): No  Physical Activity: Inactive (09/29/2021)   Exercise Vital Sign    Days of Exercise per Week: 0 days    Minutes of Exercise per Session: 0 min  Stress: No Stress Concern Present (09/29/2021)   [employer] of Occupational Health - Occupational Stress Questionnaire    Feeling of Stress : Not at all  Social Connections: Socially Integrated (10/23/2023)   Social Connection and Isolation Panel    Frequency of Communication with Friends and Family: More than three times a week    Frequency of Social Gatherings with Friends and Family: Twice a week    Attends Religious Services: More  than 4 times per year    Active Member of [employer] or Organizations: Yes    Attends Engineer, structural: More than 4 times per year    Marital Status: Married   Water engineer and Provision: Detailed education and instructions provided on heart failure disease management including the following:  Signs and symptoms of Heart Failure When to call the physician Importance of daily weights Low sodium diet- patient currently compliant with low sodium diet Fluid restriction-patient drinks less than 64 oz per day Medication management-says he is compliant with the medications prescribed to him. Anticipated future follow-up appointments-TOC AHF Clinic scheduled  Patient education given on each of the above topics.  Patient acknowledges understanding via teach back method and acceptance of all instructions.  Education Materials:  Living Better With Heart Failure Booklet, HF zone tool, & Daily Weight Tracker Tool.  Patient has  scale at home: Yes Patient has pill box at home: No. He does not have many pills to take currently.    High Risk Criteria for Readmission and/or Poor Patient Outcomes: Heart failure hospital admissions (last 6 months): 1  No Show rate: 1% Difficult social situation: Assisting with his wife at home who recently broke her femur. Demonstrates medication adherence: Yes Primary Language: English Literacy level: Reading, Writing & Comprehension  Barriers of Care:   Left eye vision impairment.  Smoking Cessation  Considerations/Referrals:  Referral made to Heart Failure Pharmacist Stewardship: Yes Referral made to Heart Failure CSW/NCM TOC: No Referral made to Heart & Vascular TOC clinic: Yes.First available appointment [facility] AHF [facility] 11/03/23 @ 10:30 AM  Items for Follow-up on DC/TOC: Daily Weights Diet & Fluid Restrictions Continued Heart Failure Education Smoking Cessation  Charmaine Pines, RN, BSN [facility] Heart Failure  Navigator Secure Chat Only

## 2023-10-24 NOTE — Progress Notes (Signed)
 Mobility Specialist Progress Note:    10/24/23 1310  Mobility  Activity Ambulated with assistance  Level of Assistance Independent  Assistive Device None  Distance Ambulated (ft) 240 ft  Range of Motion/Exercises Active;All extremities  Activity Response Tolerated well  Mobility Referral Yes  Mobility visit 1 Mobility  Mobility Specialist Start Time (ACUTE ONLY) 1310  Mobility Specialist Stop Time (ACUTE ONLY) 1330  Mobility Specialist Time Calculation (min) (ACUTE ONLY) 20 min   Pt received in bed, agreeable to mobility. Independently able to stand and ambulate with no AD. Tolerated well, stated he feels much better. Left sitting EOB, all needs met.  Daniel [name] Mobility Specialist Please contact via Special educational needs teacher or  Rehab office at [phone]

## 2023-10-25 ENCOUNTER — Telehealth (HOSPITAL_COMMUNITY): Payer: Self-pay | Admitting: Pharmacy Technician

## 2023-10-25 ENCOUNTER — Other Ambulatory Visit (HOSPITAL_COMMUNITY): Payer: Self-pay

## 2023-10-25 DIAGNOSIS — I10 Essential (primary) hypertension: Secondary | ICD-10-CM

## 2023-10-25 DIAGNOSIS — E876 Hypokalemia: Secondary | ICD-10-CM

## 2023-10-25 DIAGNOSIS — I255 Ischemic cardiomyopathy: Secondary | ICD-10-CM | POA: Diagnosis not present

## 2023-10-25 DIAGNOSIS — Z95810 Presence of automatic (implantable) cardiac defibrillator: Secondary | ICD-10-CM | POA: Diagnosis not present

## 2023-10-25 DIAGNOSIS — I5043 Acute on chronic combined systolic (congestive) and diastolic (congestive) heart failure: Secondary | ICD-10-CM | POA: Diagnosis not present

## 2023-10-25 DIAGNOSIS — R062 Wheezing: Secondary | ICD-10-CM

## 2023-10-25 DIAGNOSIS — I5023 Acute on chronic systolic (congestive) heart failure: Secondary | ICD-10-CM | POA: Diagnosis not present

## 2023-10-25 DIAGNOSIS — N138 Other obstructive and reflux uropathy: Secondary | ICD-10-CM

## 2023-10-25 DIAGNOSIS — N401 Enlarged prostate with lower urinary tract symptoms: Secondary | ICD-10-CM | POA: Diagnosis not present

## 2023-10-25 DIAGNOSIS — R7989 Other specified abnormal findings of blood chemistry: Secondary | ICD-10-CM

## 2023-10-25 DIAGNOSIS — I340 Nonrheumatic mitral (valve) insufficiency: Secondary | ICD-10-CM | POA: Diagnosis not present

## 2023-10-25 DIAGNOSIS — I2510 Atherosclerotic heart disease of native coronary artery without angina pectoris: Secondary | ICD-10-CM | POA: Diagnosis not present

## 2023-10-25 LAB — RENAL FUNCTION PANEL
Albumin: 3.4 g/dL — ABNORMAL LOW (ref 3.5–5.0)
Anion gap: 9 (ref 5–15)
BUN: 28 mg/dL — ABNORMAL HIGH (ref 8–23)
CO2: 27 mmol/L (ref 22–32)
Calcium: 9.2 mg/dL (ref 8.9–10.3)
Chloride: 103 mmol/L (ref 98–111)
Creatinine, Ser: 0.91 mg/dL (ref 0.61–1.24)
GFR, Estimated: >60 mL/min (ref >60)
Glucose, Bld: 105 mg/dL — ABNORMAL HIGH (ref 70–99)
Phosphorus: 3.5 mg/dL (ref 2.5–4.6)
Potassium: 4.3 mmol/L (ref 3.5–5.1)
Sodium: 139 mmol/L (ref 135–145)

## 2023-10-25 MED ORDER — FUROSEMIDE 20 MG PO TABS: 20.0000 mg | ORAL_TABLET | Freq: Every day | ORAL | Status: DC

## 2023-10-25 MED ORDER — BENAZEPRIL HCL 20 MG PO TABS: 10.0000 mg | ORAL_TABLET | Freq: Two times a day (BID) | ORAL | Status: DC

## 2023-10-25 MED ORDER — NICOTINE 14 MG/24HR TD PT24: 14.0000 mg | MEDICATED_PATCH | Freq: Every day | TRANSDERMAL | 0 refills | Status: DC

## 2023-10-25 MED ORDER — PREDNISONE 20 MG PO TABS: ORAL_TABLET | ORAL | 0 refills | Status: DC

## 2023-10-25 MED ORDER — AZITHROMYCIN 250 MG PO TABS: 500.0000 mg | ORAL_TABLET | Freq: Every day | ORAL | 0 refills | Status: AC

## 2023-10-25 MED ORDER — ALBUTEROL SULFATE HFA 108 (90 BASE) MCG/ACT IN AERS: 1.0000 | INHALATION_SPRAY | Freq: Four times a day (QID) | RESPIRATORY_TRACT | 1 refills | Status: AC | PRN

## 2023-10-25 NOTE — Progress Notes (Addendum)
 Rounding Note   Patient Name: Daniel [name]. Date of Encounter: 10/25/2023  [facility] Cardiologist: Lynwood Schilling, MD   Subjective No complaints.  Still notes improvement in all symptoms.  Patient still able to walk around hospital floor and to restroom without any concerns.  Scheduled Meds:  aspirin  EC  81 mg Oral Q breakfast   azithromycin   500 mg Oral Daily   dapagliflozin  propanediol  10 mg Oral Daily   furosemide   20 mg Intravenous BID   heparin   5,000 Units Subcutaneous Q8H   nicotine   14 mg Transdermal Daily   predniSONE   50 mg Oral Q breakfast   sodium chloride  flush  3 mL Intravenous Q12H   sodium chloride  flush  3 mL Intravenous Q12H   Continuous Infusions:  PRN Meds: acetaminophen  **OR** acetaminophen , albuterol , ALPRAZolam , bisacodyl , ondansetron  **OR** ondansetron  (ZOFRAN ) IV, polyethylene glycol, sodium chloride  flush   Vital Signs  Vitals:   10/24/23 2001 10/24/23 2100 10/25/23 0500 10/25/23 0525  BP:  130/74  136/85  Pulse:  74  73  Resp:  19  17  Temp:  97.6 F (36.4 C)  (!) 97.5 F (36.4 C)  TempSrc:  Oral  Oral  SpO2: 96% 97%  97%  Weight:   63.6 kg   Height:       No intake or output data in the 24 hours ending 10/25/23 0948    10/25/2023    5:00 AM 10/24/2023    5:00 AM 10/23/2023    7:42 AM  Last 3 Weights  Weight (lbs) 140 lb 3.4 oz 141 lb 5 oz 143 lb 15.4 oz  Weight (kg) 63.6 kg 64.1 kg 65.3 kg      Telemetry AV paced, HR 70s with PVCs- Personally Reviewed   Physical Exam GEN: No acute distress.  Sitting upright on the side of the bed. Neck: No JVD Cardiac: RRR, no murmurs, rubs, or gallops.  Respiratory: Clear to auscultation bilaterally. GI: Soft, nontender, non-distended  MS: No edema; No deformity. Neuro:  Nonfocal  Psych: Normal affect   Labs High Sensitivity Troponin:   Recent Labs  Lab 10/23/23 0745 10/23/23 1012  TROPONINIHS 43* 33*     Chemistry Recent Labs  Lab 10/23/23 0745 10/24/23 0440  10/25/23 0439  NA 139 139 139  K 3.5 3.3* 4.3  CL 101 101 103  CO2 27 26 27   GLUCOSE 95 112* 105*  BUN 22 27* 28*  CREATININE 0.98 1.01 0.91  CALCIUM  9.1 8.9 9.2  PROT 6.4*  --   --   ALBUMIN  3.7  --  3.4*  AST 28  --   --   ALT 20  --   --   ALKPHOS 65  --   --   BILITOT 1.6*  --   --   GFRNONAA >60 >60 >60  ANIONGAP 11 12 9     Lipids No results for input(s): CHOL, TRIG, HDL, LABVLDL, LDLCALC, CHOLHDL in the last 168 hours.  Hematology Recent Labs  Lab 10/23/23 0745  WBC 4.8  RBC 5.13  HGB 16.5  HCT 49.5  MCV 96.5  MCH 32.2  MCHC 33.3  RDW 14.7  PLT 131*   Thyroid  No results for input(s): TSH, FREET4 in the last 168 hours.  BNP Recent Labs  Lab 10/23/23 0752  BNP 2,576.0*    DDimer No results for input(s): DDIMER in the last 168 hours.   Radiology  ECHOCARDIOGRAM COMPLETE Result Date: 10/23/2023    ECHOCARDIOGRAM REPORT  Patient Name:   Daniel [name]. Date of Exam: 10/23/2023 Medical Rec #:  [id number]        Height:       70.0 in Accession #:    [id number]       Weight:       144.0 lb Date of Birth:  [birth year]         BSA:          1.815 m Patient Age:    78 years         BP:           132/99 mmHg Patient Gender: M                HR:           126 bpm. Exam Location:  [facility] Procedure: 2D Echo, 3D Echo, Cardiac Doppler, Color Doppler, Strain Analysis and            Intracardiac Opacification Agent (Both Spectral and Color Flow            Doppler were utilized during procedure). Indications:    CHF  History:        Patient has prior history of Echocardiogram examinations, most                 recent 01/02/2020. CHF, CAD, Defibrillator and Prior CABG, COPD;                 Risk Factors:Hypertension and Dyslipidemia.  Sonographer:    Philomena Daring Referring Phys: [id number] COURAGE EMOKPAE  Sonographer Comments: Global longitudinal strain was attempted. IMPRESSIONS  1. Left ventricular ejection fraction, by estimation, is approximately 20%. The left  ventricle has severely decreased function. The left ventricle demonstrates regional wall motion abnormalities (see scoring diagram/findings for description). The left ventricular internal cavity size was moderately dilated. There is mild concentric left ventricular hypertrophy. Left ventricular diastolic parameters are indeterminate.  2. No formed LV mural thrombus evident by Definity  contrast.  3. Right ventricular systolic function is moderately reduced. The right ventricular size is normal. There is mildly elevated pulmonary artery systolic pressure. The estimated right ventricular systolic pressure is 36.7 mmHg.  4. Left atrial size was moderately dilated.  5. Right atrial size was moderately dilated.  6. The mitral valve is degenerative. Moderate to severe mitral valve regurgitation.  7. The aortic valve is tricuspid. There is mild calcification of the aortic valve. Aortic valve regurgitation is mild. Aortic regurgitation PHT measures 846 msec.  8. The inferior vena cava is dilated in size with >50% respiratory variability, suggesting right atrial pressure of 8 mmHg. Comparison(s): Prior images reviewed side by side. LVEF approximately 20% with wall motion abnormalties consistent with ischemic cardiomyopathy. Moderate to severe mitral regurgitation. FINDINGS  Left Ventricle: Left ventricular ejection fraction, by estimation, is 20%. The left ventricle has severely decreased function. The left ventricle demonstrates regional wall motion abnormalities. Definity  contrast agent was given IV to delineate the left  ventricular endocardial borders. The left ventricular internal cavity size was moderately dilated. There is mild concentric left ventricular hypertrophy. Left ventricular diastolic parameters are indeterminate.  LV Wall Scoring: The entire inferior wall, mid inferolateral segment, mid inferoseptal segment, apical septal segment, apical anterior segment, and apex are akinetic. The anterior wall,  antero-lateral wall, anterior septum, basal inferolateral segment, apical lateral segment, and basal inferoseptal segment are hypokinetic. Right Ventricle: The right ventricular size is normal. No increase in right ventricular wall thickness. Right ventricular systolic function  is moderately reduced. There is mildly elevated pulmonary artery systolic pressure. The tricuspid regurgitant velocity is 2.68 m/s, and with an assumed right atrial pressure of 8 mmHg, the estimated right ventricular systolic pressure is 36.7 mmHg. Left Atrium: Left atrial size was moderately dilated. Right Atrium: Right atrial size was moderately dilated. Pericardium: There is no evidence of pericardial effusion. Mitral Valve: The mitral valve is degenerative in appearance. Moderate to severe mitral valve regurgitation, with posteriorly-directed jet. Tricuspid Valve: The tricuspid valve is grossly normal. Tricuspid valve regurgitation is mild. Aortic Valve: The aortic valve is tricuspid. There is mild calcification of the aortic valve. There is mild aortic valve annular calcification. Aortic valve regurgitation is mild. Aortic regurgitation PHT measures 846 msec. Pulmonic Valve: The pulmonic valve was grossly normal. Pulmonic valve regurgitation is trivial. Aorta: The aortic root and ascending aorta are structurally normal, with no evidence of dilitation. Venous: The inferior vena cava is dilated in size with greater than 50% respiratory variability, suggesting right atrial pressure of 8 mmHg. IAS/Shunts: No atrial level shunt detected by color flow Doppler. Additional Comments: 3D was performed not requiring image post processing on an independent workstation and was indeterminate. A device lead is visualized.  LEFT VENTRICLE PLAX 2D LVIDd:         6.60 cm   Diastology LVIDs:         6.30 cm   LV e' medial:    5.66 cm/s LV PW:         1.00 cm   LV E/e' medial:  12.9 LV IVS:        1.10 cm   LV e' lateral:   9.03 cm/s LVOT diam:     2.40 cm    LV E/e' lateral: 8.1 LV SV:         43 LV SV Index:   24 LVOT Area:     4.52 cm                           3D Volume EF:                          LV EDV:       330 ml                          LV ESV:       266 ml                          LV SV:        65 ml RIGHT VENTRICLE            IVC RV Basal diam:  3.70 cm    IVC diam: 2.90 cm RV S prime:     5.87 cm/s TAPSE (M-mode): 1.3 cm LEFT ATRIUM             Index        RIGHT ATRIUM           Index LA diam:        4.30 cm 2.37 cm/m   RA Area:     22.30 cm LA Vol (A2C):   78.7 ml 43.36 ml/m  RA Volume:   79.90 ml  44.02 ml/m LA Vol (A4C):   70.8 ml 39.01 ml/m LA Biplane Vol: 77.3 ml 42.59 ml/m  AORTIC VALVE  PULMONIC VALVE LVOT Vmax:   73.90 cm/s  PR End Diast Vel: 15.84 msec LVOT Vmean:  44.600 cm/s LVOT VTI:    0.095 m AI PHT:      846 msec  AORTA Ao Root diam: 3.70 cm Ao Asc diam:  3.50 cm MITRAL VALVE               TRICUSPID VALVE MV Area (PHT): 3.99 cm    TR Peak grad:   28.7 mmHg MV Decel Time: 190 msec    TR Vmax:        268.00 cm/s MR Peak grad: 91.8 mmHg MR Mean grad: 62.0 mmHg    SHUNTS MR Vmax:      479.00 cm/s  Systemic VTI:  0.10 m MR Vmean:     377.0 cm/s   Systemic Diam: 2.40 cm MV E velocity: 73.20 cm/s MV A velocity: 37.90 cm/s MV E/A ratio:  1.93 Jayson Sierras MD Electronically signed by Jayson Sierras MD Signature Date/Time: 10/23/2023/4:59:46 PM    Final     Cardiac Studies Cath 05/2016  ?Mid RCA lesion, 100 %stenosed. ?Dist Cx lesion, 100 %stenosed. ?Ost Cx to Prox Cx lesion, 50 %stenosed. ?LM lesion, 30 %stenosed. ?Prox LAD to Mid LAD lesion, 100 %stenosed. ?The left ventricular ejection fraction is less than 25% by visual estimate. ?LV end diastolic pressure is normal. ?There is severe left ventricular systolic dysfunction.   Severe LV dysfunction with an ejection fraction of 20-25%. There was akinesis of the distal inferoapical segment with significant hypokinesis globally c/w an ischemic cardiomyopathy.    Severe multivessel CAD with 30% near ostial stenosis, 75% proximal, and total occlusion of a large LAD with distal collateralization; 50% proximal left circumflex stenoses with total occlusion of the distal circumflex; and chronic total occlusion of the mid RCA with collateralization distally via the RV marginal branch as well as via the left coronary injection.   RECOMMENDATION: Surgical consultation for CABG revascularization.    ECHO 10/23/2023 IMPRESSIONS  1. Left ventricular ejection fraction, by estimation, is approximately  20%. The left ventricle has severely decreased function. The left  ventricle demonstrates regional wall motion abnormalities (see scoring  diagram/findings for description). The left  ventricular internal cavity size was moderately dilated. There is mild  concentric left ventricular hypertrophy. Left ventricular diastolic  parameters are indeterminate.   2. No formed LV mural thrombus evident by Definity  contrast.   3. Right ventricular systolic function is moderately reduced. The right  ventricular size is normal. There is mildly elevated pulmonary artery  systolic pressure. The estimated right ventricular systolic pressure is  36.7 mmHg.   4. Left atrial size was moderately dilated.   5. Right atrial size was moderately dilated.   6. The mitral valve is degenerative. Moderate to severe mitral valve  regurgitation.   7. The aortic valve is tricuspid. There is mild calcification of the  aortic valve. Aortic valve regurgitation is mild. Aortic regurgitation PHT  measures 846 msec.   8. The inferior vena cava is dilated in size with >50% respiratory  variability, suggesting right atrial pressure of 8 mmHg.   Comparison(s): Prior images reviewed side by side. LVEF approximately 20%  with wall motion abnormalties consistent with ischemic cardiomyopathy.  Moderate to severe mitral regurgitation.   Patient Profile   78 y.o. male with a hx of CAD s/p CABG LIMA to  D1, SVG to LAD, SVG to PDA on 05/12/2016, HLD intolerant to statin, HTN, ICM with baseline EF 20-25% via ECHO in  01/2020 s/p  St jude DDD ICD in 04/2019, and h/o prostate CA who is being seen 10/24/2023 for the evaluation of HF at the request of Dr. Pearlean.   Assessment & Plan  Acute on chronic HFrEF Presented with DOE, productive cough, orthopnea, and LE edema.  BNP 2556, TN 43>33.  CXR consistent with mild pulmonary interstitial edema.  Echo this admission showed EF 20%, + RWMA, moderately dilated LV cavity, mild concentric LVH, indeterminate diastolic parameters, no LV thrombus, moderately reduced RV function, mildly elevated PASP with RVSP at 36.7 mmHg, moderately dilated L/R atrial size, moderate to severe MV regurgitation, mild AV regurgitation, mild calcification of aortic valve, dilated IVC. Treated with IV Lasix  40 mg X1 then 20 mg X1. Suspect inaccurate I/O with +300. Wt 143 >140. Note significant urine output.  Red Clip yesterday 23 %. Appear Euvolemic on exam.  Currently, no complaint with significant improvement in symptoms.  He has been able to walk around nursing desk and to restroom without any concerns. Appears euvolemic on exam without wheezing in lungs.  Discontinue IV Lasix .  Restart home PO Lasix  with 40 mg in the am.  Continue Farxiga  10 mg daily.  Per Dr. Debera, can consider cardioselective BB, bisoprolol  for additional GDMT based on BP measurements. Consider starting Bisoprolol  5 mg daily based on repeat BP. Will discuss with MD.  Continue to hold Benazepril . May consider restarting alternative ACEI in outpatient.  GDMT limited. Previously discontinued from carvedilol  (bradycardia), spironolactone  (hyperkalemia, heart skip beat), Entresto  (rash) due to intolerance.  Patient has been not seen heart failure team since 12/2021.  Will schedule follow-up. Although EF is lower at 20% and BNP elevated, suspect that COPD and tobacco use are contributing factors as well since also  receiving COPD treatment.  Patient appears at baseline and clear from cardiac standpoint from discharge.  Will discuss with MD.   CAD s/p CABG LIMA to D1, SVG to LAD, SVG to PDA on 05/12/2016  TN 43>33 EKG with AV dual paced complexes Denies any anginal symptoms.  No need for ischemic evaluation at this time. Consider starting bisoprolol  as above. Continue ASA 81 mg daily Previously noted intolerant to statins.  Hypokalemia  K 3.3 improved with KCL supplement to 4.3 Consider outpatient KCL 20 MEQ  with PO lasix .    HTN BP mildly elevated at 5 am 136/85 prior to Iv lasix . Will contact nurse to repeat BP.  Per Dr. Debera, can consider cardioselective BB, bisoprolol  for additional GDMT based on BP measurements.  Consider starting bisoprolol  as above. Continue to hold benazepril  as above.    Ischemic cardiomyopathy ICD in place s/p  St jude DDD ICD in 04/2019 Device interrogation 10/06/2023 showed AV dual place with appropriate programming and no arrhythmias noted. Continue to follow EP.   Moderate to severe MV regurgitation Already scheduled for follow-up with valvular team on 10/3   AAA He has a 42 mm infrarenal abdominal aneurysm and aneurysmal dilatation of the common iliacs. He is followed by Dr. Sheree.    For questions or updates, please contact [facility] Please consult [email or web address] for contact info under       Signed, Lorette CINDERELLA Kapur, PA-C  10/25/2023, 9:48 AM

## 2023-10-25 NOTE — Progress Notes (Signed)
 Heart Failure Stewardship Pharmacy Note  PCP: Candise Aleene DEL, MD PCP-Cardiologist: Daniel Schilling, MD  HPI: Daniel [name]. is a 78 y.o. male with chronic systolic heart failure status post ICD and pacemaker, history of CAD status post CABG, hypertension, COPD, history of A. fib briefly after CABG  ICM, and h/o prostate CA who presented with shortness of breath and swelling. On admission, BNP was 2576, HS-troponin was 43, and LFTs were normal. Chest x-ray noted mild pulmonary interstitial edema.   Pertinent cardiac history: CABG 05/2016 with LIMA to D1, SVG to LAD, SVG to PDA. ICD placed 04/2019. TTE 05/2016 with LVEF of 25-30%. TTE this admission noted LVEF further decreased to 20% with mild LVH, moderate RV dysfunction, moderate to severe MR.  Pertinent Lab Values: Creat  Date Value Ref Range Status  12/17/2021 1.10 0.70 - 1.28 mg/dL Final   Creatinine, Ser  Date Value Ref Range Status  10/25/2023 0.91 0.61 - 1.24 mg/dL Final   BUN  Date Value Ref Range Status  10/25/2023 28 (H) 8 - 23 mg/dL Final  98/73/7978 15 8 - 27 mg/dL Final   Potassium  Date Value Ref Range Status  10/25/2023 4.3 3.5 - 5.1 mmol/L Final   Sodium  Date Value Ref Range Status  10/25/2023 139 135 - 145 mmol/L Final  02/26/2019 139 134 - 144 mmol/L Final   Brain Natriuretic Peptide  Date Value Ref Range Status  12/17/2021 1,361 (H) <100 pg/mL Final    Comment:    . BNP levels increase with age in the general population with the highest values seen in individuals greater than 32 years of age. Reference: J. Am. Penne. Cardiol. 2002; 59:023-017. .    B Natriuretic Peptide  Date Value Ref Range Status  10/23/2023 2,576.0 (H) 0.0 - 100.0 pg/mL Final    Comment:    Performed at [facility], [address], [place], [place] [address]   Magnesium   Date Value Ref Range Status  08/26/2022 2.1 1.7 - 2.4 mg/dL Final    Comment:    Performed at [facility], [address] [place], [place]  [address]   Hgb A1c MFr Bld  Date Value Ref Range Status  01/02/2020 5.2 4.8 - 5.6 % Final    Comment:    (NOTE) Pre diabetes:          5.7%-6.4%  Diabetes:              >6.4%  Glycemic control for   <7.0% adults with diabetes    TSH  Date Value Ref Range Status  09/29/2020 0.48 0.35 - 5.50 uIU/mL Final    Vital Signs:  Temp:  [97.5 F (36.4 C)-97.8 F (36.6 C)] 97.5 F (36.4 C) (09/24 0525) Pulse Rate:  [73-78] 73 (09/24 0525) Cardiac Rhythm: A-V Sequential paced (09/23 2101) Resp:  [17-19] 17 (09/24 0525) BP: (130-136)/(74-85) 136/85 (09/24 0525) SpO2:  [96 %-97 %] 97 % (09/24 0525) Weight:  [63.6 kg (140 lb 3.4 oz)] 63.6 kg (140 lb 3.4 oz) (09/24 0500)  Intake/Output Summary (Last 24 hours) at 10/25/2023 0729 Last data filed at 10/24/2023 0800 Gross per 24 hour  Intake 300 ml  Output --  Net 300 ml    Current Heart Failure Medications:  Loop diuretic: furosemide  20 mg IV BID Beta-Blocker: none ACEI/ARB/ARNI: none MRA: none SGLT2i: Farxiga  10 mg daily Other: none  Prior to admission Heart Failure Medications:  Loop diuretic: furosemide  20 mg BID Beta-Blocker: none ACEI/ARB/ARNI: benazepril  20 mg  BID MRA: none SGLT2i: Farxiga  10 mg daily Other: none  Assessment: 1. Acute on chronic systolic heart failure (LVEF 20%)  , due to ICM. NYHA class II-III symptoms.  -Symptoms: Reports shortness of breath and LEE are significantly improved, able to ambulate the halls without shortness of breath. Reports improved orthopnea. -Volume: No I/Os documented. Patient reports good urine output. Weight down 3 lbs if accurate. Currently on furosemide  20 mg IV BID. Creatinine and BUN are stable from yesterday. Volume assessment limited by lack of physical exam. Would continue IV furosemide  this morning and potential oral tomorrow if euvolemic. -Hemodynamics: BP is elevated, HR 70s.  -BB: Previously on carvedilol , stopped for bradycardia, however this is no longer a barrier s/p PPM.  Consider adding when euvolemic.  -ACEI/ARB/ARNI: Taking benazepril  PTA. BP elevated. Consider substituting while inpatient with lisinopril.  -MRA: Spironolactone  stopped in 2018 due to hyperkalemia, however was taking oral potassium at the time. May be able to try eplerenone or Kerendia. Received 80 meq PO today. -SGLT2i:Continue Farxiga  10 mg daily  Plan: 1) Medication changes recommended at this time: -Consider adding lisinopril 20 mg daily to replace home benazepril  while admitted  2) Patient assistance: -Kerendia requires prior authorization  3) Education: - Patient has been educated on current HF medications and potential additions to HF medication regimen - Patient verbalizes understanding that over the next few months, these medication doses may change and more medications may be added to optimize HF regimen - Patient has been educated on basic disease state pathophysiology and goals of therapy  Medication Assistance / Insurance Benefits Check: Does the patient have prescription insurance?    Please do not hesitate to reach out with questions or concerns,  Jaun Bash, PharmD, CPP, BCPS, [facility] Heart Failure Pharmacist  Phone - [phone] 10/25/2023 10:10 AM

## 2023-10-25 NOTE — Telephone Encounter (Signed)
 Patient Product/process development scientist completed.    The patient is insured through [facility]. Patient has Medicare and is not eligible for a copay card, but may be able to apply for patient assistance or Medicare RX Payment Plan (Patient Must reach out to their plan, if eligible for payment plan), if available.    Ran test claim for Kerendia 10 mg and Requires Prior Authorization   This test claim was processed through [employer]- copay amounts may vary at other pharmacies due to [employer], or as the patient moves through the different stages of their insurance plan.     Roland Earl, CPHT Pharmacy Technician III Certified Patient Advocate [facility] Pharmacy Patient Advocate Team Direct Number: [phone]  Fax: [phone]

## 2023-10-25 NOTE — Care Management Important Message (Signed)
 Important Message  Patient Details  Name: Daniel [name]. MRN: [id number] Date of Birth: [birth year]   Important Message Given:  Yes - Medicare IM     Aarnav Steagall L Kaelani Kendrick 10/25/2023, 2:25 PM

## 2023-10-25 NOTE — Progress Notes (Signed)
 Mobility Specialist Progress Note:    10/25/23 0845  Mobility  Activity Ambulated with assistance  Level of Assistance Independent  Assistive Device None  Distance Ambulated (ft) 400 ft  Range of Motion/Exercises Active;All extremities  Activity Response Tolerated well  Mobility Referral Yes  Mobility visit 1 Mobility  Mobility Specialist Start Time (ACUTE ONLY) 0845  Mobility Specialist Stop Time (ACUTE ONLY) 0905  Mobility Specialist Time Calculation (min) (ACUTE ONLY) 20 min   Pt received in bed, agreeable to mobility. Independently able to stand and ambulate with no AD. Tolerated well, slight audible wheezing. Left sitting EOB, all needs met.  Jarmon Javid Mobility Specialist Please contact via Special educational needs teacher or  Rehab office at [phone]

## 2023-10-25 NOTE — Discharge Summary (Signed)
 Physician Discharge Summary   Patient: Daniel [name]. MRN: [id number] DOB: 07-May-1945  Admit date:     10/23/2023  Discharge date: 10/25/23  Discharge Physician: Daniel [name]   PCP: Daniel Aleene DEL, MD   Recommendations at discharge:  Repeat base metabolic panel to follow electrolytes and renal function Make sure patient follow-up with cardiology service as instructed Reassess blood pressure and adjust medication as needed.  Discharge Diagnoses: Principal Problem:   Acute exacerbation of CHF (congestive heart failure) (HCC) Active Problems:   Benign prostatic hyperplasia with urinary obstruction   Tobacco dependence   Ischemic cardiomyopathy   ICD (implantable cardioverter-defibrillator) in place   Wheezing   Acute on chronic combined systolic and diastolic CHF (congestive heart failure) (HCC)   Elevated troponin   Elevated brain natriuretic peptide (BNP) level  Brief Hospital admission narrative: As per H&P written by Daniel [name] on 10/23/2023 Daniel [name]. is a 77 y.o. male with medical history significant for chronic systolic heart failure status post ICD placement and pacemaker, history of CAD status post CABG (CAD s/p CABG LIMA to D1, SVG to LAD, SVG to PDA on 05/12/2016, ), hypertension, COPD, history of A. fib briefly after CABG ,  ICM with baseline EF 30-35% (now down to 20 % on 10/23/23), and h/o prostate CA who presents to the ED with ongoing chest tightness cough and dyspnea worse over the last 3 days, he reports orthopnea and acute on chronic leg swelling, cough is more productive with colored sputum, sadly patient continues to smoke No fever  Or chills    No Nausea, Vomiting or Diarrhea - Patient is voiding well - Reports compliance with medication   Echo from 10/23/23 with LVEF approximately 20% with wall motion abnormalties consistent with ischemic cardiomyopathy and Moderate to severe mitral regurgitation.  -- Chest x-ray suggest  status post CABG and  pulmonary interstitial edema -BNP 2556 much higher than previous Tropinin 43 >> 33, EKG with atrial ventricular dual paced complexes - COVID flu and RSV negative - CMP WNL except for T. bili of 1.3  platelets down to 131 patient has a history of chronic thrombocytopenia, otherwise CBC normal    Assessment and Plan: 1) acute exacerbation of Chronic systolic dysfunction CHF--POA Repeat/updated echo from 10/23/23 with LVEF approximately 20% with wall motion abnormalties consistent with ischemic cardiomyopathy and Moderate to severe mitral regurgitation. -His EF was previously as high as 30 to 35% -- On admission chest x-ray suggest  status post CABG and pulmonary interstitial edema - On admission BNP 2556 much higher than previous - Hold benazepril  to allow BP room for aggressive diuresis 10/24/23 -wt 143.9 >>141 - At discharge will take Farxiga  daily, 20 mg of Lasix  and 10 mg Lotensin . - Appreciate cardiology service and recommendation. -GDMT for HFrEF has been limited: - He has prior allergy to Entresto , previous bradycardia on Coreg , and hyperkalemia on spironolactone .  - Cardiology team may add bisoprolol  as outpatient (patient now has ICD/pacemaker)-- so  bradycardia is no longer a concern - Continue to follow daily weights, adequate hydration and low-sodium diet as instructed.     2)CAD s/p CABG LIMA to D1, SVG to LAD, SVG to PDA on 05/12/2016- Tropinin 43 >> 33, EKG with atrial ventricular dual paced complexes. -- Continue aspirin  - Patient apparently is intolerant to statins - Heart healthy diet discussed with patient.   3)Hypertension:  - Stable and well-controlled - At discharge Lotensin  has been adjusted to 10 mg daily. -Heart healthy/low-sodium diet discussed  with patient.   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  He is followed by Daniel [name].     6) asthma/COPD/Tobacco abuse--Mild COPD excerb --smoking cessation advised, -- Cough dyspnea and wheezing adequately resolving -- Discharged with instruction for prednisone  tapering and to complete oral antibiotics. - Resume home use of Singulair and continue as needed bronchodilators.    7)Hypokalemia----due to diuresis - Replace potassium  Consultants: Cardiology service Procedures performed: See below for x-ray reports. Disposition: Home Diet recommendation: Heart healthy/low-sodium diet.  DISCHARGE MEDICATION: Allergies as of 10/25/2023       Reactions   Entresto  [sacubitril -valsartan ] Hives, Shortness Of Breath   Amlodipine  Swelling   LE swelling   Contrast Media [iodinated Contrast Media] Other (See Comments)   Prostate problem had to wear a catheter for two weeks after having dye.    Hydralazine  Palpitations, Other (See Comments)   Fatigue+   Carvedilol     Bradycardia   Spironolactone     Hyperkalemia         Medication List     TAKE these medications    albuterol  108 (90 Base) MCG/ACT inhaler Commonly known as: VENTOLIN  HFA Inhale 1-2 puffs into the lungs every 6 (six) hours as needed for wheezing or shortness of breath.   ALPRAZolam  0.25 MG tablet Commonly known as: XANAX  tAKE 1 to 2 tabLETS BY MOUTH AT BEDTIME AS NEEDED FOR insomnia/restless legs What changed: See the new instructions.   aspirin  EC 81 MG tablet Take 1 tablet (81 mg total) by mouth daily. Swallow whole.   azithromycin  250 MG tablet Commonly known as: ZITHROMAX  Take 2 tablets (500 mg total) by mouth daily for 3 days.   benazepril  20 MG tablet Commonly known as: LOTENSIN  Take 0.5 tablets (10 mg total) by mouth in the morning and at bedtime. To keep BP Levels at 140/80. If levels are lower than this, patient only takes one-half tablet What changed: how much to take   Farxiga  10 MG Tabs tablet Generic drug: dapagliflozin   propanediol Take 10 mg by mouth daily.   furosemide  20 MG tablet Commonly known as: LASIX  Take 1 tablet (20 mg total) by mouth daily. What changed: when to take this   levocetirizine 5 MG tablet Commonly known as: XYZAL Take 5 mg by mouth every evening.   montelukast 10 MG tablet Commonly known as: SINGULAIR Take 10 mg by mouth daily.   nicotine  14 mg/24hr patch Commonly known as: NICODERM CQ  - dosed in mg/24 hours Place 1 patch (14 mg total) onto the skin daily.   omeprazole 20 MG capsule Commonly known as: PRILOSEC Take 20 mg by mouth daily as needed (for acid reflux).   predniSONE  20 MG tablet Commonly known as: DELTASONE  Take 2 tablets by mouth daily x 2 days; then 1 tablet by mouth daily x 2 days; then 1/2 tablet by mouth daily x 3 days and stop prednisone . Start taking on: October 26, 2023        Follow-up Information     [facility]. Go on 11/03/2023.   Specialty: Cardiology Why: Hospital Follow-Up  Please bring all medications to follow-up appointment [facility] [address]TR[address] of [address] [place] Parking at the door or use GATE CODE [phone] to park under the building. Contact information: [address] [place]  [address] [phone]        Daniel Aleene DEL, MD. Schedule an appointment as soon as possible for a visit in 10 day(s).   Specialty: Family Medicine Contact information: [address] [place] [address] [phone]                Discharge Exam: Fredricka Weights   10/23/23 0742 10/24/23 0500 10/25/23 0500  Weight: 65.3 kg 64.1 kg 63.6 kg   General exam: Alert, awake, oriented x 3; in no acute distress and feeling ready to go home. Respiratory system: Positive scattered rhonchi; no frank wheezing or crackles.  Good saturation on room air. Cardiovascular system: Rate controlled, no rubs, no gallops, no  JVD. Gastrointestinal system: Abdomen is nondistended, soft and nontender. No organomegaly or masses felt. Normal bowel sounds heard. Central nervous system: Alert and oriented. No focal neurological deficits. Extremities: No C/C/E, +pedal pulses Skin: No petechiae. Psychiatry: Judgement and insight appear normal. Mood & affect appropriate.    Condition at discharge: Stable and improved.  The results of significant diagnostics from this hospitalization (including imaging, microbiology, ancillary and laboratory) are listed below for reference.   Imaging Studies: ECHOCARDIOGRAM COMPLETE Result Date: 10/23/2023    ECHOCARDIOGRAM REPORT   Patient Name:   Doss Cybulski. Date of Exam: 10/23/2023 Medical Rec #:  [id number]        Height:       70.0 in Accession #:    [id number]       Weight:       144.0 lb Date of Birth:  26-May-1945         BSA:          1.815 m Patient Age:    78 years         BP:           132/99 mmHg Patient Gender: M                HR:           126 bpm. Exam Location:  [facility] Procedure: 2D Echo, 3D Echo, Cardiac Doppler, Color Doppler, Strain Analysis and            Intracardiac Opacification Agent (Both Spectral and Color Flow            Doppler were utilized during procedure). Indications:    CHF  History:        Patient has prior history of Echocardiogram examinations, most                 recent 01/02/2020. CHF, CAD, Defibrillator and Prior CABG, COPD;                 Risk Factors:Hypertension and Dyslipidemia.  Sonographer:    Philomena Daring Referring Phys: [id number] COURAGE EMOKPAE  Sonographer Comments: Global longitudinal strain was attempted. IMPRESSIONS  1. Left ventricular ejection fraction, by estimation, is approximately 20%. The left ventricle has severely decreased function. The left ventricle demonstrates regional wall motion abnormalities (see scoring diagram/findings for description). The left ventricular internal cavity size was moderately dilated. There is mild  concentric left ventricular hypertrophy. Left ventricular diastolic parameters are indeterminate.  2. No formed LV mural thrombus evident by Definity  contrast.  3. Right ventricular systolic function is moderately reduced. The right ventricular size is normal. There is mildly elevated pulmonary artery systolic pressure. The estimated right ventricular systolic pressure is 36.7 mmHg.  4. Left atrial size  was moderately dilated.  5. Right atrial size was moderately dilated.  6. The mitral valve is degenerative. Moderate to severe mitral valve regurgitation.  7. The aortic valve is tricuspid. There is mild calcification of the aortic valve. Aortic valve regurgitation is mild. Aortic regurgitation PHT measures 846 msec.  8. The inferior vena cava is dilated in size with >50% respiratory variability, suggesting right atrial pressure of 8 mmHg. Comparison(s): Prior images reviewed side by side. LVEF approximately 20% with wall motion abnormalties consistent with ischemic cardiomyopathy. Moderate to severe mitral regurgitation. FINDINGS  Left Ventricle: Left ventricular ejection fraction, by estimation, is 20%. The left ventricle has severely decreased function. The left ventricle demonstrates regional wall motion abnormalities. Definity  contrast agent was given IV to delineate the left  ventricular endocardial borders. The left ventricular internal cavity size was moderately dilated. There is mild concentric left ventricular hypertrophy. Left ventricular diastolic parameters are indeterminate.  LV Wall Scoring: The entire inferior wall, mid inferolateral segment, mid inferoseptal segment, apical septal segment, apical anterior segment, and apex are akinetic. The anterior wall, antero-lateral wall, anterior septum, basal inferolateral segment, apical lateral segment, and basal inferoseptal segment are hypokinetic. Right Ventricle: The right ventricular size is normal. No increase in right ventricular wall thickness. Right  ventricular systolic function is moderately reduced. There is mildly elevated pulmonary artery systolic pressure. The tricuspid regurgitant velocity is 2.68 m/s, and with an assumed right atrial pressure of 8 mmHg, the estimated right ventricular systolic pressure is 36.7 mmHg. Left Atrium: Left atrial size was moderately dilated. Right Atrium: Right atrial size was moderately dilated. Pericardium: There is no evidence of pericardial effusion. Mitral Valve: The mitral valve is degenerative in appearance. Moderate to severe mitral valve regurgitation, with posteriorly-directed jet. Tricuspid Valve: The tricuspid valve is grossly normal. Tricuspid valve regurgitation is mild. Aortic Valve: The aortic valve is tricuspid. There is mild calcification of the aortic valve. There is mild aortic valve annular calcification. Aortic valve regurgitation is mild. Aortic regurgitation PHT measures 846 msec. Pulmonic Valve: The pulmonic valve was grossly normal. Pulmonic valve regurgitation is trivial. Aorta: The aortic root and ascending aorta are structurally normal, with no evidence of dilitation. Venous: The inferior vena cava is dilated in size with greater than 50% respiratory variability, suggesting right atrial pressure of 8 mmHg. IAS/Shunts: No atrial level shunt detected by color flow Doppler. Additional Comments: 3D was performed not requiring image post processing on an independent workstation and was indeterminate. A device lead is visualized.  LEFT VENTRICLE PLAX 2D LVIDd:         6.60 cm   Diastology LVIDs:         6.30 cm   LV e' medial:    5.66 cm/s LV PW:         1.00 cm   LV E/e' medial:  12.9 LV IVS:        1.10 cm   LV e' lateral:   9.03 cm/s LVOT diam:     2.40 cm   LV E/e' lateral: 8.1 LV SV:         43 LV SV Index:   24 LVOT Area:     4.52 cm                           3D Volume EF:                          LV  EDV:       330 ml                          LV ESV:       266 ml                          LV SV:         65 ml RIGHT VENTRICLE            IVC RV Basal diam:  3.70 cm    IVC diam: 2.90 cm RV S prime:     5.87 cm/s TAPSE (M-mode): 1.3 cm LEFT ATRIUM             Index        RIGHT ATRIUM           Index LA diam:        4.30 cm 2.37 cm/m   RA Area:     22.30 cm LA Vol (A2C):   78.7 ml 43.36 ml/m  RA Volume:   79.90 ml  44.02 ml/m LA Vol (A4C):   70.8 ml 39.01 ml/m LA Biplane Vol: 77.3 ml 42.59 ml/m  AORTIC VALVE             PULMONIC VALVE LVOT Vmax:   73.90 cm/s  PR End Diast Vel: 15.84 msec LVOT Vmean:  44.600 cm/s LVOT VTI:    0.095 m AI PHT:      846 msec  AORTA Ao Root diam: 3.70 cm Ao Asc diam:  3.50 cm MITRAL VALVE               TRICUSPID VALVE MV Area (PHT): 3.99 cm    TR Peak grad:   28.7 mmHg MV Decel Time: 190 msec    TR Vmax:        268.00 cm/s MR Peak grad: 91.8 mmHg MR Mean grad: 62.0 mmHg    SHUNTS MR Vmax:      479.00 cm/s  Systemic VTI:  0.10 m MR Vmean:     377.0 cm/s   Systemic Diam: 2.40 cm MV E velocity: 73.20 cm/s MV A velocity: 37.90 cm/s MV E/A ratio:  1.93 Jayson Sierras MD Electronically signed by Jayson Sierras MD Signature Date/Time: 10/23/2023/4:59:46 PM    Final    DG Chest Port 1 View Result Date: 10/23/2023 EXAM: 1 VIEW XRAY OF THE CHEST 10/23/2023 08:10:00 AM COMPARISON: 09/06/2023 CLINICAL HISTORY: Pt c/o chest tightness starting about three days ago. Pt states having a congested cough. Pt states he has had cough for over a month and had a chest xray done by PCP. Pt has Abbot pacemaker. Pt states increased SHOB over the past week. FINDINGS: LUNGS AND PLEURA: Mild interstitial thickening with peripheral septal markings identified compatible with mild edema. No signs of significant pleural effusion, pneumothorax or consolidative change. HEART AND MEDIASTINUM: Persistent cardiomegaly. CABG noted. Left chest pacemaker/AICD with leads overlying right atrium and right ventricle. Median sternotomy wires noted. BONES AND SOFT TISSUES: No acute osseous abnormality. IMPRESSION: 1.  Findings consistent with mild pulmonary interstitial edema; no pleural effusion or pneumothorax. 2. Cardiomegaly.  Status post CABG. Electronically signed by: [name] Calk MD 10/23/2023 08:23 AM EDT RP Workstation: [id number]   CUP PACEART REMOTE DEVICE CHECK Result Date: 10/07/2023 ICD Scheduled remote reviewed. Normal device function.  Presenting rhythm:  AP-VP, PVCs.  5 AHR detections, longest lasting 16 seconds.  HF diagnostics have  been abnormal in this monitoring period.  Occasional out of range measurements on autocapture trends. Next remote 91 days. - CS, CVRS   Microbiology: Results for orders placed or performed during the hospital encounter of 10/23/23  Resp panel by RT-PCR (RSV, Flu A&B, Covid) Anterior Nasal Swab     Status: None   Collection Time: 10/23/23  8:10 AM   Specimen: Anterior Nasal Swab  Result Value Ref Range Status   SARS Coronavirus 2 by RT PCR NEGATIVE NEGATIVE Final    Comment: (NOTE) SARS-CoV-2 target nucleic acids are NOT DETECTED.  The SARS-CoV-2 RNA is generally detectable in upper respiratory specimens during the acute phase of infection. The lowest concentration of SARS-CoV-2 viral copies this assay can detect is 138 copies/mL. A negative result does not preclude SARS-Cov-2 infection and should not be used as the sole basis for treatment or other patient management decisions. A negative result may occur with  improper specimen collection/handling, submission of specimen other than nasopharyngeal swab, presence of viral mutation(s) within the areas targeted by this assay, and inadequate number of viral copies(<138 copies/mL). A negative result must be combined with clinical observations, patient history, and epidemiological information. The expected result is Negative.  Fact Sheet for Patients:  BloggerCourse.com  Fact Sheet for Healthcare Providers:  SeriousBroker.it  This test is no t yet approved  or cleared by the [place]  FDA and  has been authorized for detection and/or diagnosis of SARS-CoV-2 by FDA under an Emergency Use Authorization (EUA). This EUA will remain  in effect (meaning this test can be used) for the duration of the COVID-19 declaration under Section 564(b)(1) of the Act, 21 U.S.C.section 360bbb-3(b)(1), unless the authorization is terminated  or revoked sooner.       Influenza A by PCR NEGATIVE NEGATIVE Final   Influenza B by PCR NEGATIVE NEGATIVE Final    Comment: (NOTE) The Xpert Xpress SARS-CoV-2/FLU/RSV plus assay is intended as an aid in the diagnosis of influenza from Nasopharyngeal swab specimens and should not be used as a sole basis for treatment. Nasal washings and aspirates are unacceptable for Xpert Xpress SARS-CoV-2/FLU/RSV testing.  Fact Sheet for Patients: BloggerCourse.com  Fact Sheet for Healthcare Providers: SeriousBroker.it  This test is not yet approved or cleared by the [place]  FDA and has been authorized for detection and/or diagnosis of SARS-CoV-2 by FDA under an Emergency Use Authorization (EUA). This EUA will remain in effect (meaning this test can be used) for the duration of the COVID-19 declaration under Section 564(b)(1) of the Act, 21 U.S.C. section 360bbb-3(b)(1), unless the authorization is terminated or revoked.     Resp Syncytial Virus by PCR NEGATIVE NEGATIVE Final    Comment: (NOTE) Fact Sheet for Patients: BloggerCourse.com  Fact Sheet for Healthcare Providers: SeriousBroker.it  This test is not yet approved or cleared by the [place]  FDA and has been authorized for detection and/or diagnosis of SARS-CoV-2 by FDA under an Emergency Use Authorization (EUA). This EUA will remain in effect (meaning this test can be used) for the duration of the COVID-19 declaration under Section 564(b)(1) of the Act,  21 U.S.C. section 360bbb-3(b)(1), unless the authorization is terminated or revoked.  Performed at [facility], [address], [place], [place] [address]     Labs: CBC: Recent Labs  Lab 10/23/23 0745  WBC 4.8  HGB 16.5  HCT 49.5  MCV 96.5  PLT 131*   Basic Metabolic Panel: Recent Labs  Lab 10/23/23 0745 10/24/23 0440 10/25/23 0439  NA 139  139 139  K 3.5 3.3* 4.3  CL 101 101 103  CO2 27 26 27   GLUCOSE 95 112* 105*  BUN 22 27* 28*  CREATININE 0.98 1.01 0.91  CALCIUM  9.1 8.9 9.2  PHOS  --   --  3.5   Liver Function Tests: Recent Labs  Lab 10/23/23 0745 10/25/23 0439  AST 28  --   ALT 20  --   ALKPHOS 65  --   BILITOT 1.6*  --   PROT 6.4*  --   ALBUMIN  3.7 3.4*   CBG: No results for input(s): GLUCAP in the last 168 hours.  Discharge time spent:  35 minutes.  Signed: Eric Nunnery, MD [facility] 10/25/2023

## 2023-10-26 ENCOUNTER — Telehealth: Payer: Self-pay

## 2023-10-26 NOTE — Transitions of Care (Post Inpatient/ED Visit) (Signed)
 10/26/2023  Name: Daniel [name]. MRN: [id number] DOB: Jun 07, 1945  Today's TOC FU Call Status: Today's TOC FU Call Status:: Successful TOC FU Call Completed TOC FU Call Complete Date: 10/26/23 Patient's Name and Date of Birth confirmed.  Transition Care Management Follow-up Telephone Call How have you been since you were released from the hospital?: Better (feels weak, but much better) Any questions or concerns?: No  Items Reviewed: Did you receive and understand the discharge instructions provided?: Yes Medications obtained,verified, and reconciled?: Yes (Medications Reviewed) Any new allergies since your discharge?: No Dietary orders reviewed?: Yes Type of Diet Ordered:: low salt heart healthy diet Do you have support at home?: Yes People in Home [RPT]: child(ren), adult Name of Support/Comfort Primary Source: Daniel [name]  Medications Reviewed Today: Medications Reviewed Today     Reviewed by Rumalda Alan PENNER, RN (Registered Nurse) on 10/26/23 at 1439  Med List Status: <None>   Medication Order Taking? Sig Documenting Provider Last Dose Status Informant  albuterol  (VENTOLIN  HFA) 108 (90 Base) MCG/ACT inhaler [id number] Yes Inhale 1-2 puffs into the lungs every 6 (six) hours as needed for wheezing or shortness of breath. Ricky Fines, MD  Active   ALPRAZolam  (XANAX ) 0.25 MG tablet [id number] Yes tAKE 1 to 2 tabLETS BY MOUTH AT BEDTIME AS NEEDED FOR insomnia/restless legs Candise Aleene DEL, MD  Active Self, Pharmacy Records  aspirin  EC 81 MG tablet [id number] Yes Take 1 tablet (81 mg total) by mouth daily. Swallow whole. Lavona Lynwood, MD  Active Self, Pharmacy Records  azithromycin  (ZITHROMAX ) 250 MG tablet [id number] Yes Take 2 tablets (500 mg total) by mouth daily for 3 days. Ricky Fines, MD  Active   benazepril  (LOTENSIN ) 20 MG tablet [id number] Yes Take 0.5 tablets (10 mg total) by mouth in the morning and at bedtime. To keep BP Levels at 140/80. If levels are lower than this,  patient only takes one-half tablet Ricky Fines, MD  Active     Discontinued 11/06/14 1929   dapagliflozin  propanediol (FARXIGA ) 10 MG TABS tablet [id number] Yes Take 10 mg by mouth daily. [provider]  Active Self, Pharmacy Records  furosemide  (LASIX ) 20 MG tablet [id number] Yes Take 1 tablet (20 mg total) by mouth daily. Ricky Fines, MD  Active   levocetirizine (XYZAL) 5 MG tablet [id number] Yes Take 5 mg by mouth every evening. [provider]  Active Self, Pharmacy Records  montelukast (SINGULAIR) 10 MG tablet [id number] Yes Take 10 mg by mouth daily. [provider]  Active Self, Pharmacy Records  nicotine  (NICODERM CQ  - DOSED IN MG/24 HOURS) 14 mg/24hr patch [id number]  Place 1 patch (14 mg total) onto the skin daily.  Patient not taking: Reported on 10/26/2023   Ricky Fines, MD  Active   omeprazole (PRILOSEC) 20 MG capsule [id number] Yes Take 20 mg by mouth daily as needed (for acid reflux). [provider]  Active Self, Pharmacy Records  predniSONE  (DELTASONE ) 20 MG tablet [id number] Yes Take 2 tablets by mouth daily x 2 days; then 1 tablet by mouth daily x 2 days; then 1/2 tablet by mouth daily x 3 days and stop prednisone . Ricky Fines, MD  Active             Home Care and Equipment/Supplies: Were Home Health Services Ordered?: No Any new equipment or medical supplies ordered?: No  Functional Questionnaire: Do you need assistance with bathing/showering or dressing?: No Do you need assistance with meal preparation?: No Do you need assistance with eating?: No  Do you have difficulty maintaining continence: No Do you need assistance with getting out of bed/getting out of a chair/moving?: No Do you have difficulty managing or taking your medications?: No  Follow up appointments reviewed: PCP Follow-up appointment confirmed?: No (Will call and make an appointment, declines my assistance) MD Provider Line Number:[phone] Given:  No Specialist Hospital Follow-up appointment confirmed?: Yes Date of Specialist follow-up appointment?: 11/03/23 Follow-Up Specialty Provider:: Cardiology Do you need transportation to your follow-up appointment?: No Do you understand care options if your condition(s) worsen?: Yes-patient verbalized understanding  Patient reports that he is feeling better. Reports weight is unchanged. Reports no swelling today.  Weight is unchanged. Patient reports that he weighs daily and knows when to call MD. Reports that he is following his low salt diet.  Reports that he has all his medications and is taking as prescribed.  Monitors BP daily.  Reviewed importance of patient to continue to take medications as prescribed. Reviewed importance of continuing to weigh daily.   Reviewed and offered patient 30 day TOC program and patient declined.  Provided my contact information. Offered to schedule PCP follow up and patient declined.SABRA Alan Ee, RN, BSN, CEN Population Health- Transition of Care Team.  [facility] [phone]

## 2023-10-26 NOTE — Transitions of Care (Post Inpatient/ED Visit) (Signed)
   10/26/2023  Name: Aviel Davalos. MRN: [id number] DOB: [birth year]  Today's TOC FU Call Status: Today's TOC FU Call Status:: Unsuccessful Call (1st Attempt) Unsuccessful Call (1st Attempt) Date: 10/26/23  Attempted to reach the patient regarding the most recent Inpatient/ED visit.  Follow Up Plan: Additional outreach attempts will be made to reach the patient to complete the Transitions of Care (Post Inpatient/ED visit) call.   Alan Ee, RN, BSN, CEN [employer]- Transition of Care Team.  [facility] [phone]

## 2023-10-31 ENCOUNTER — Telehealth: Payer: Self-pay

## 2023-10-31 DIAGNOSIS — I5043 Acute on chronic combined systolic (congestive) and diastolic (congestive) heart failure: Secondary | ICD-10-CM

## 2023-11-02 ENCOUNTER — Telehealth: Payer: Self-pay

## 2023-11-02 ENCOUNTER — Telehealth (HOSPITAL_COMMUNITY): Payer: Self-pay

## 2023-11-02 NOTE — Telephone Encounter (Signed)
 Called to confirm/remind patient of their appointment at the [facility] on 11/03/23 10:30.   Appointment:   [x] Confirmed  [] Left mess   [] No answer/No voice mail  [] VM Full/unable to leave message  [] Phone not in service  Patient reminded to bring all medications and/or complete list.  Confirmed patient has transportation. Gave directions, instructed to utilize valet parking.

## 2023-11-02 NOTE — Progress Notes (Signed)
 HEART & VASCULAR TRANSITION OF CARE CONSULT NOTE     Referring Physician: Dr. Ricky PCP: Candise Aleene DEL, MD  Cardiologist: Lynwood Schilling, MD  Chief Complaint: chronic HFrEF  HPI: Referred to clinic by Dr. Ricky for heart failure consultation.   Daniel [name]. is a 78 y.o. male with chronic HFrEF s/p ICD, CAD s/p CABG (s/p CABG LIMA to D1, SVG to LAD, SVG to PDA 4/18), HTN, COPD, brief a fib post CABG and h/o prostate cancer.   Admitted 9/25 with A/C HFrEF. Diuresed well with IV lasix . Discharged with PO diuretics and Farxiga . EF 9/25: ~20%, LV with RWMA, mild LVH, RV mod reduced, LA/RA mod dilated, mod-severe MR  Today he presents for [facility] clinic visit. Overall feeling ***. Denies palpitations, CP, dizziness, edema, or PND/Orthopnea. *** SOB. Appetite ok. No fever or chills. Weight at home *** pounds. Taking all medications. Denies ETOH, tobacco or drug use.   Chronic HFrEF - EF 9/25: ~20%, LV with RWMA, mild LVH, RV mod reduced, LA/RA mod dilated, mod-severe MR - NYHA *** - Volume *** - Off Entresto  2/2 allergy. Off spiro w/ hyperkalemia. Off coreg  w/ bradycardia (now with PPM, consider restarting).  - Continue Farxiga  10 mg daily   CAD - s/p CABG 4/18 (LIMA to D1, SVG to LAD, SVG to PDA)  HTN AAA Tobacco abuse COPD Asthma - Followed by Dr. Sheree  Cardiac Testing  - EF 9/25: ~20%, LV with RWMA, mild LVH, RV mod reduced, LA/RA mod dilated, mod-severe MR  Past Medical History:  Diagnosis Date   Abdominal aortic aneurysm 01/2021   4 cm, infrarenal.  Also enlarged left common iliac artery--vascular eval 03/2021->rpt aortic u/s 06/01/22 stable Aorta 4.2 cm.  Bilat common iliac aneurisms and left common femoral aneurism-->f/u 1 yr   Acute prostatitis 06/19/2013   Anxiety    Atrial fibrillation (HCC)    Limited episode, emergency room, June, 2013, spontaneous conversion to sinus rhythm, was evaluated By Dr. Micky 2013- no further visits.  Post-op (CABG) a-fib,  converted on amio but pt self d/c'd this med due to DOE/side effect profile.   Blurred vision 01/01/2020   Bradycardia 06/20/2017   CAD (coronary artery disease) 05/2016   a. 05/2016: NSTEMI with cath showing 3-vessel disease. Underwent CABG on 05/12/16 with LIMA-D1, SVG-LAD, and SVG-PDA.    COPD (chronic obstructive pulmonary disease) (HCC) fall 2016   Bullous changes noted on lower lung images of CT abd done by urology   Diverticulitis    Double vision 01/2020   MG ab w/u NEG.  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 Unable to have MRI due to having shrapnel in forehead.   Hyperlipidemia    statin intolerant. Pt declined advanced lipid clinic referral 08/2019, 03/2020, and 01/2021.   Hypertension    Ischemic cardiomyopathy 05/2016   EF 20-25% at the time of NSTEMI/CABG.  Repeat EF 07/2016 30-35%.  07/2017 EF 25-30%, pt intol of entresto , bidil, and BB--for ICD 03/2019.   Microscopic hematuria Fall 2016   CT nl except nonobstructing stones.  Cystoscopy normal 12/30/14 (Dr. 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  Urol (Dr. Beola) at [facility]; Dr. Beola referred him to Dr. Renda 03/2015: patient got bilat nerve sparing , robot assisted laparoscopic radical prostatectomy and pelvic lymphadenectomy.  Urol f/u, PSA surveillance showing very mild uptrend of PSA but not to the level of biochemical recurrence as of 05/2021 urology follow-up   RLS (restless legs  syndrome)    Tobacco dependence    down to 1-2 cigs per day as of 11/2015.  Restarted 08/2016.   Urinary retention 05/2015   occurred s/p foley removal   UTI (urinary tract infection) 11/14/2014    Current Outpatient Medications  Medication Sig Dispense Refill   albuterol  (VENTOLIN  HFA) 108 (90 Base) MCG/ACT inhaler Inhale 1-2 puffs into the lungs every 6 (six) hours as needed for wheezing or shortness of breath. 8 g 1   ALPRAZolam  (XANAX ) 0.25 MG tablet tAKE 1 to 2 tabLETS BY MOUTH AT BEDTIME AS NEEDED FOR insomnia/restless legs 60 tablet 1   aspirin  EC 81 MG tablet Take 1 tablet (81 mg total) by mouth daily. Swallow whole. 90 tablet 3   benazepril  (LOTENSIN ) 20 MG tablet Take 0.5 tablets (10 mg total) by mouth in the morning and at bedtime. To keep BP Levels at 140/80. If levels are lower than this, patient only takes one-half tablet     dapagliflozin  propanediol (FARXIGA ) 10 MG TABS tablet Take 10 mg by mouth daily.     furosemide  (LASIX ) 20 MG tablet Take 1 tablet (20 mg total) by mouth daily.     levocetirizine (XYZAL) 5 MG tablet Take 5 mg by mouth every evening.     montelukast (SINGULAIR) 10 MG tablet Take 10 mg by mouth daily.     nicotine  (NICODERM CQ  - DOSED IN MG/24 HOURS) 14 mg/24hr patch Place 1 patch (14 mg total) onto the skin daily. (Patient not taking: Reported on 10/26/2023) 28 patch 0   omeprazole (PRILOSEC) 20 MG capsule Take 20 mg by mouth daily as needed (for acid reflux).     predniSONE  (DELTASONE ) 20 MG tablet Take 2 tablets by mouth daily x 2 days; then 1 tablet by mouth daily x 2 days; then 1/2 tablet by mouth daily x 3 days and stop prednisone . 9 tablet 0   No current facility-administered medications for this visit.    Allergies  Allergen Reactions   Entresto  [Sacubitril -Valsartan ] Hives and Shortness Of Breath   Amlodipine  Swelling    LE swelling   Contrast Media [Iodinated Contrast Media] Other (See Comments)    Prostate problem had to wear a catheter for  two weeks after having dye.    Hydralazine  Palpitations and Other (See Comments)    Fatigue+   Carvedilol      Bradycardia   Spironolactone      Hyperkalemia       Social History   Socioeconomic History   Marital status: Married    Spouse name: Not on file   Number of children: Not on file   Years of education: Not on file   Highest education level: Not on file  Occupational History   Not on file  Tobacco Use   Smoking status: Some Days    Current packs/day: 1.00    Average packs/day: 1 pack/day for 55.0 years (55.0 ttl pk-yrs)    Types: Cigarettes  Smokeless tobacco: Never   Tobacco comments:    quit in 2017  Vaping Use   Vaping status: Never Used  Substance and Sexual Activity   Alcohol use: No   Drug use: No   Sexual activity: Not on file  Other Topics Concern   Not on file  Social History Narrative   Married, 2 grown children.HS education. Orig from [place], lives there now.Occupation: maintenance.  Drives a tractor a lot, drives a pickup truck a lot, rides horses a lot.Tobacco: 20 pack-yr hx (current as of 07/2012)No drugs.Alcohol: none   Social Drivers of Corporate investment banker Strain: Low Risk  (09/29/2021)   Overall Financial Resource Strain (CARDIA)    Difficulty of Paying Living Expenses: Not hard at all  Food Insecurity: No Food Insecurity (10/23/2023)   Hunger Vital Sign    Worried About Running Out of Food in the Last Year: Never true    Ran Out of Food in the Last Year: Never true  Transportation Needs: No Transportation Needs (10/23/2023)   PRAPARE - Administrator, Civil Service (Medical): No    Lack of Transportation (Non-Medical): No  Physical Activity: Inactive (09/29/2021)   Exercise Vital Sign    Days of Exercise per Week: 0 days    Minutes of Exercise per Session: 0 min  Stress: No Stress Concern Present (09/29/2021)   [employer] of Occupational Health - Occupational Stress Questionnaire    Feeling of Stress : Not at  all  Social Connections: Socially Integrated (10/23/2023)   Social Connection and Isolation Panel    Frequency of Communication with Friends and Family: More than three times a week    Frequency of Social Gatherings with Friends and Family: Twice a week    Attends Religious Services: More than 4 times per year    Active Member of [employer] or Organizations: Yes    Attends Engineer, structural: More than 4 times per year    Marital Status: Married  Catering manager Violence: Not At Risk (10/23/2023)   Humiliation, Afraid, Rape, and Kick questionnaire    Fear of Current or Ex-Partner: No    Emotionally Abused: No    Physically Abused: No    Sexually Abused: No      Family History  Problem Relation Age of Onset   Hypertension Mother    Cancer - Other Mother 64       breast   Cancer Father        Lung ca age 66   Diabetes Sister     There were no vitals filed for this visit.  PHYSICAL EXAM: General:  *** appearing.  No respiratory difficulty Neck: JVD *** cm.  Cor: Regular rate & rhythm. No murmurs. Lungs: clear Extremities: no edema  Neuro: alert & oriented x 3. Affect pleasant.   ECG:   ASSESSMENT & PLAN:  NYHA *** GDMT  Diuretic- BB- Ace/ARB/ARNI MRA SGLT2i    Referred to HFSW (PCP, Medications, Transportation, ETOH Abuse, Drug Abuse, Insurance, Financial ): Yes or No Refer to Pharmacy: Yes or No Refer to Home Health: Yes on No Refer to [facility]: Yes or no  Refer to General Cardiology: Yes or No  Follow up

## 2023-11-02 NOTE — Progress Notes (Signed)
 Complex Care Management Note  Care Guide Note 11/02/2023 Name: Pax Reasoner. MRN: [id number] DOB: Jun 09, 1945  [name] [name]. is a 78 y.o. year old male who sees McGowen, Aleene DEL, MD for primary care. I reached out to [employer]. by phone today to offer complex care management services.  Mr. Boeckman was given information about Complex Care Management services today including:   The Complex Care Management services include support from the care team which includes your Nurse Care Manager, Clinical Social Worker, or Pharmacist.  The Complex Care Management team is here to help remove barriers to the health concerns and goals most important to you. Complex Care Management services are voluntary, and the patient may decline or stop services at any time by request to their care team member.   Complex Care Management Consent Status: Patient agreed to services and verbal consent obtained.   Follow up plan:    Encounter Outcome:  Patient Refused  Jeoffrey Buffalo , RMA     [facility]  [facility], [facility] Guide  Direct Dial: [phone]  Website: [email or web address]

## 2023-11-03 ENCOUNTER — Telehealth (HOSPITAL_COMMUNITY): Payer: Self-pay | Admitting: *Deleted

## 2023-11-03 ENCOUNTER — Ambulatory Visit (HOSPITAL_COMMUNITY)
Admit: 2023-11-03 | Discharge: 2023-11-03 | Disposition: A | Discharge status: Home / Self Care | Attending: Internal Medicine | Admitting: Internal Medicine

## 2023-11-03 ENCOUNTER — Ambulatory Visit (HOSPITAL_COMMUNITY): Payer: Self-pay | Admitting: Internal Medicine

## 2023-11-03 ENCOUNTER — Encounter (HOSPITAL_COMMUNITY): Payer: Self-pay

## 2023-11-03 VITALS — BP 120/70 | HR 74

## 2023-11-03 DIAGNOSIS — Z7982 Long term (current) use of aspirin: Secondary | ICD-10-CM | POA: Insufficient documentation

## 2023-11-03 DIAGNOSIS — I2510 Atherosclerotic heart disease of native coronary artery without angina pectoris: Secondary | ICD-10-CM | POA: Diagnosis not present

## 2023-11-03 DIAGNOSIS — I714 Abdominal aortic aneurysm, without rupture, unspecified: Secondary | ICD-10-CM | POA: Insufficient documentation

## 2023-11-03 DIAGNOSIS — I5022 Chronic systolic (congestive) heart failure: Secondary | ICD-10-CM

## 2023-11-03 DIAGNOSIS — Z8249 Family history of ischemic heart disease and other diseases of the circulatory system: Secondary | ICD-10-CM | POA: Insufficient documentation

## 2023-11-03 DIAGNOSIS — J4489 Other specified chronic obstructive pulmonary disease: Secondary | ICD-10-CM | POA: Insufficient documentation

## 2023-11-03 DIAGNOSIS — Z95810 Presence of automatic (implantable) cardiac defibrillator: Secondary | ICD-10-CM | POA: Insufficient documentation

## 2023-11-03 DIAGNOSIS — Z825 Family history of asthma and other chronic lower respiratory diseases: Secondary | ICD-10-CM | POA: Insufficient documentation

## 2023-11-03 DIAGNOSIS — Z951 Presence of aortocoronary bypass graft: Secondary | ICD-10-CM | POA: Diagnosis not present

## 2023-11-03 DIAGNOSIS — F17210 Nicotine dependence, cigarettes, uncomplicated: Secondary | ICD-10-CM | POA: Insufficient documentation

## 2023-11-03 DIAGNOSIS — Z720 Tobacco use: Secondary | ICD-10-CM | POA: Diagnosis not present

## 2023-11-03 DIAGNOSIS — I10 Essential (primary) hypertension: Secondary | ICD-10-CM | POA: Diagnosis not present

## 2023-11-03 DIAGNOSIS — J449 Chronic obstructive pulmonary disease, unspecified: Secondary | ICD-10-CM | POA: Diagnosis not present

## 2023-11-03 DIAGNOSIS — E785 Hyperlipidemia, unspecified: Secondary | ICD-10-CM | POA: Diagnosis not present

## 2023-11-03 DIAGNOSIS — J45909 Unspecified asthma, uncomplicated: Secondary | ICD-10-CM

## 2023-11-03 DIAGNOSIS — I110 Hypertensive heart disease with heart failure: Secondary | ICD-10-CM | POA: Insufficient documentation

## 2023-11-03 DIAGNOSIS — Z8546 Personal history of malignant neoplasm of prostate: Secondary | ICD-10-CM | POA: Diagnosis not present

## 2023-11-03 DIAGNOSIS — Z79899 Other long term (current) drug therapy: Secondary | ICD-10-CM | POA: Insufficient documentation

## 2023-11-03 LAB — BASIC METABOLIC PANEL WITH GFR
Anion gap: 7 (ref 5–15)
BUN: 23 mg/dL (ref 8–23)
CO2: 28 mmol/L (ref 22–32)
Calcium: 9.1 mg/dL (ref 8.9–10.3)
Chloride: 102 mmol/L (ref 98–111)
Creatinine, Ser: 1.08 mg/dL (ref 0.61–1.24)
GFR, Estimated: >60 mL/min (ref >60)
Glucose, Bld: 103 mg/dL — ABNORMAL HIGH (ref 70–99)
Potassium: 4.5 mmol/L (ref 3.5–5.1)
Sodium: 137 mmol/L (ref 135–145)

## 2023-11-03 LAB — BRAIN NATRIURETIC PEPTIDE: B Natriuretic Peptide: 2714.7 pg/mL — ABNORMAL HIGH (ref 0.0–100.0)

## 2023-11-03 MED ORDER — FUROSEMIDE 20 MG PO TABS: 40.0000 mg | ORAL_TABLET | Freq: Every day | ORAL | 3 refills | Status: DC

## 2023-11-03 NOTE — Telephone Encounter (Signed)
 Called patient per Beckey Coe, NP with following:  BNP elevated. Increase lasix  to 40 mg daily. K 4.5, will hold off on starting spironolactone  at this time. Needs BMET AND BNP scheduled in 7-10 days.  Pt verbalized understanding of same. Labs ordered and scheduled.

## 2023-11-03 NOTE — Patient Instructions (Signed)
 Thank you for your visit today.  There has been no changes to your medications.  Labs done today, your results will be available in MyChart, we will contact you for abnormal readings.  Thank you for allowing us  to provider your heart failure care after your recent hospitalization. Please follow-up with your general cardiologist as scheduled.  If you have any questions, issues, or concerns before your next appointment please call our office at [phone], opt. 2 and leave a message for the triage nurse.

## 2023-11-13 ENCOUNTER — Encounter: Payer: Self-pay | Admitting: Physician Assistant

## 2023-11-14 ENCOUNTER — Ambulatory Visit (HOSPITAL_COMMUNITY)
Admission: RE | Admit: 2023-11-14 | Discharge: 2023-11-14 | Disposition: A | Discharge status: Home / Self Care | Source: Ambulatory Visit | Attending: Internal Medicine | Admitting: Internal Medicine

## 2023-11-14 ENCOUNTER — Ambulatory Visit (HOSPITAL_COMMUNITY): Payer: Self-pay | Admitting: Internal Medicine

## 2023-11-14 DIAGNOSIS — I5022 Chronic systolic (congestive) heart failure: Secondary | ICD-10-CM | POA: Insufficient documentation

## 2023-11-14 LAB — BASIC METABOLIC PANEL WITH GFR
Anion gap: 12 (ref 5–15)
BUN: 22 mg/dL (ref 8–23)
CO2: 26 mmol/L (ref 22–32)
Calcium: 9.1 mg/dL (ref 8.9–10.3)
Chloride: 96 mmol/L — ABNORMAL LOW (ref 98–111)
Creatinine, Ser: 1.67 mg/dL — ABNORMAL HIGH (ref 0.61–1.24)
GFR, Estimated: 42 mL/min — ABNORMAL LOW (ref >60)
Glucose, Bld: 96 mg/dL (ref 70–99)
Potassium: 4.6 mmol/L (ref 3.5–5.1)
Sodium: 134 mmol/L — ABNORMAL LOW (ref 135–145)

## 2023-11-16 MED ORDER — FUROSEMIDE 20 MG PO TABS: 20.0000 mg | ORAL_TABLET | Freq: Every day | ORAL | 3 refills | Status: DC

## 2023-11-16 NOTE — Telephone Encounter (Signed)
 Pt aware and voiced understanding

## 2023-11-16 NOTE — Addendum Note (Signed)
 Addended by: Mialani Reicks, DALTON HERO on: 11/16/2023 02:27 PM   Modules accepted: Orders

## 2023-11-20 NOTE — Progress Notes (Unsigned)
 Cardiology Office Note:  .   Date:  11/22/2023  ID:  Daniel [name]., DOB December 07, 1945, MRN [id number] PCP: Candise Aleene DEL, MD  [facility] Providers Cardiologist:  Daniel Schilling, MD Electrophysiologist:  Danelle Birmingham, MD {  History of Present Illness: .   Arlan Birks. is a 78 y.o. male  with PMHx of CAD (s/p CABG LIMA to D1, SVG to LAD, SVG to PDA on 05/12/2016), HLD intolerant to statin, HTN, ICM with baseline EF 20-25% via ECHO in 10/2023 (s/p St Jude DDD ICD in 04/2019), and h/o prostate CA who reports to [facility] office for hospital follow up.   Recent hospitalization 9/22-24/2025  for acute on chronic HFrEF with complaints of DOE, productive cough, orthopnea and LE edema. ECHO showed EF 20%, + RWMA, moderately dilated LV cavity, mild concentric LVH, indeterminate diastolic parameters, no LV thrombus, moderately reduced RV function, mildly elevated PASP with RVSP at 36.7 mmHg, moderately dilated L/R atrial size, moderate to severe MV regurgitation, mild AV regurgitation, mild calcification of aortic valve, dilated IVC. Treated with IV lasix .  Continued on Farxiga  10 mg, benazepril  10 mg daily, Lasix  20 mg daily, ASA 81 mg.  Noted considering bisoprolol  as outpatient since patient has ICD/pacemaker and bradycardia is no longer a concern.  Seen by [facility] for HF on 11/03/2023 by Beckey Coe, NP. Volume overload on exam. No plans to follow up with HF clinic. Noted on labs could consider restarting spiro. Ordered BMET (WNL) & BNP (elevated 2714). Increased Lasix  to 40 mg. With K 4.5, held off on starting Spiro. Follow up BMET showed Cr increased to 1.67. Hold Lasix  for 2 days then restart at 20 mg daily. Recommend follow up BMP at upcoming OV.   Today, patient confirms stopping Lasix  for 2 days and resuming on Lasix  20 mg daily. Report improvement in symptoms. SOB still present but only with longer distances. LE edema still present but usually resolves overnight.  Notes extreme fatigue for the past 5-6 months. Denies any orthopnea, PND, CP,  palpitations, dizziness, syncope. Notes good urine output. Drink 32 oz of water  daily. Endorse low sodium diet. Reports compliance with medications. 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  ROS: 10 point review of system has been reviewed and considered negative except ones been listed in the HPI.   Studies Reviewed: SABRA   Cath 05/2016  ?Mid RCA lesion, 100 %stenosed. ?Dist Cx lesion, 100 %stenosed. ?Ost Cx to Prox Cx lesion, 50 %stenosed. ?LM lesion, 30 %stenosed. ?Prox LAD to Mid LAD lesion, 100 %stenosed. ?The left ventricular ejection fraction is less than 25% by visual estimate. ?LV end diastolic pressure is normal. ?There is severe left ventricular systolic dysfunction.   Severe LV dysfunction with an ejection fraction of 20-25%. There was akinesis of the distal inferoapical segment with significant hypokinesis globally c/w an ischemic cardiomyopathy.   Severe multivessel CAD with 30% near ostial stenosis, 75% proximal, and total occlusion of a large LAD with distal collateralization; 50% proximal left circumflex stenoses with total occlusion of the distal circumflex; and chronic total occlusion of the mid RCA with collateralization distally via the RV marginal branch as well as via the left coronary injection.   RECOMMENDATION: Surgical consultation for CABG revascularization.    ECHO 10/23/2023 IMPRESSIONS  1. Left ventricular ejection fraction, by estimation, is approximately  20%. The left ventricle has severely decreased function. The left  ventricle demonstrates regional wall motion abnormalities (see scoring  diagram/findings  for description). The left  ventricular internal cavity size was moderately dilated. There is mild  concentric left ventricular hypertrophy. Left ventricular diastolic  parameters are indeterminate.   2. No formed LV mural thrombus evident by Definity   contrast.   3. Right ventricular systolic function is moderately reduced. The right  ventricular size is normal. There is mildly elevated pulmonary artery  systolic pressure. The estimated right ventricular systolic pressure is  36.7 mmHg.   4. Left atrial size was moderately dilated.   5. Right atrial size was moderately dilated.   6. The mitral valve is degenerative. Moderate to severe mitral valve  regurgitation.   7. The aortic valve is tricuspid. There is mild calcification of the  aortic valve. Aortic valve regurgitation is mild. Aortic regurgitation PHT  measures 846 msec.   8. The inferior vena cava is dilated in size with >50% respiratory  variability, suggesting right atrial pressure of 8 mmHg.   Comparison(s): Prior images reviewed side by side. LVEF approximately 20%  with wall motion abnormalties consistent with ischemic cardiomyopathy.  Moderate to severe mitral regurgitation.   Physical Exam:   VS:  BP 134/88 (BP Location: Left Arm, Cuff Size: Normal)   Pulse 82   Ht 5' 10 (1.778 m)   Wt 144 lb 3.2 oz (65.4 kg)   SpO2 95%   BMI 20.69 kg/m    Wt Readings from Last 3 Encounters:  11/22/23 144 lb 3.2 oz (65.4 kg)  10/26/23 141 lb (64 kg)  10/25/23 140 lb 3.4 oz (63.6 kg)    GEN: Well nourished, well developed in no acute distress while sitting in chair.  NECK: No JVD; No carotid bruits CARDIAC: RRR, no murmurs, rubs, gallops RESPIRATORY:  Clear to auscultation without rales, wheezing or rhonchi  ABDOMEN: Soft, non-tender, non-distended EXTREMITIES:  No edema; No deformity   ASSESSMENT AND PLAN: .   HFrEF (heart failure with reduced ejection fraction) (HCC) Right ventricular dysfunction due to disorder of left ventricle Fatigue ECHO 10/23/2023: EF 20%, + RWMA, moderately dilated LV cavity, mild concentric LVH, indeterminate diastolic parameters, no LV thrombus, moderately reduced RV function, mildly elevated PASP with RVSP at 36.7 mmHg, moderately dilated L/R  atrial size, moderate to severe MV regurgitation, mild AV regurgitation, mild calcification of aortic valve, dilated IVC.   Report improvement in symptoms. SOB still present but only with longer distances. LE edema still present but usually resolves overnight. Notes extreme fatigue for the past 5-6 months. Denies orthopnea, or PND. Appears Euvolemic on exam.  Order BMP, BNP, and TSH.  Continue on Farxiga  10 mg, Lasix  20 mg daily.  Order Bisoprolol  2.5 mg  daily.  Discussed bradycardia is less likely with ICD device.  Patient reports per PCP, he benazepril  10 mg daily only if SBP is greater than 140 mmHg. Discussed the importance of daily ACEi with EF < 40 % and GDMT limited. Recommended with starting Bisoprolol , would only take Benazepril  10 mg if SBP > 120.  Patient understands and agrees. If problems with low BP then can consider decreasing Benazepril  to 5 mg daily.  GDMT limited. Previously discontinued from carvedilol  (bradycardia), spironolactone  (hyperkalemia, heart skip beat), Entresto  (rash) due to intolerance.  Per HF TOC note, not a candidate for advanced therapies with advanced age.  Encouraged low sodium diet, fluid restriction <2L, and daily weights.  Educated to contact our office for weight gain of 2 lbs overnight or 5 lbs in one week. ED precautions discussed.    Essential hypertension Reports well  controlled Home BP: 130-140/70-80's BP this OV today: 134/88 Managed by GDMT as above.  Order Bisoprolol  as above.  Encourage physical activity for 150 minutes per week and heart healthy low sodium diet. Discussed limiting sodium intake to < 2 grams daily.     Coronary artery disease involving native coronary artery of native heart without angina pectoris s/p CABG LIMA to D1, SVG to LAD, SVG to PDA on 05/12/2016  Denies angina symptoms. No need for further ischemic evaluation at this time.  LDL 106 in 05/2023.  Continue ASA 81 mg, benazepril  10 mg as above.  Order Bisoprolol  as above.   Previously noted intolerant to statins and declined referral to lipid clinic multiple times.   Ischemic cardiomyopathy ICD (implantable cardioverter-defibrillator) in place s/p  St jude DDD ICD in 04/2019 Device interrogation 10/06/2023 showed AV dual place with appropriate programming and no arrhythmias noted. Continue to follow EP.  Mitral valve insufficiency, unspecified etiology ECHO 10/2023: Moderate to severe mitral valve regurgitation, mild AV regurgitation Plan for repeat ECHO in 10/2024 unless indicated sooner   Abdominal aortic aneurysm (AAA) without rupture, unspecified part He has a 42 mm infrarenal abdominal aneurysm and aneurysmal dilatation of the common iliacs. He is followed by Dr. Sheree.     Dispo: Follow up with [facility] in [place] in 3 months or Dr. Lavona in [place] in 3 months.   Signed, Lorette CINDERELLA Kapur, PA-C

## 2023-11-22 ENCOUNTER — Encounter: Payer: Self-pay | Admitting: Physician Assistant

## 2023-11-22 ENCOUNTER — Other Ambulatory Visit (HOSPITAL_COMMUNITY)
Admission: RE | Admit: 2023-11-22 | Discharge: 2023-11-22 | Disposition: A | Discharge status: Home / Self Care | Source: Ambulatory Visit | Attending: Physician Assistant | Admitting: Physician Assistant

## 2023-11-22 ENCOUNTER — Ambulatory Visit: Attending: Student | Admitting: Physician Assistant

## 2023-11-22 VITALS — BP 134/88 | HR 82 | Ht 70.0 in | Wt 144.2 lb

## 2023-11-22 DIAGNOSIS — Z79899 Other long term (current) drug therapy: Secondary | ICD-10-CM | POA: Insufficient documentation

## 2023-11-22 DIAGNOSIS — I340 Nonrheumatic mitral (valve) insufficiency: Secondary | ICD-10-CM | POA: Diagnosis not present

## 2023-11-22 DIAGNOSIS — I2510 Atherosclerotic heart disease of native coronary artery without angina pectoris: Secondary | ICD-10-CM | POA: Diagnosis not present

## 2023-11-22 DIAGNOSIS — I714 Abdominal aortic aneurysm, without rupture, unspecified: Secondary | ICD-10-CM | POA: Diagnosis not present

## 2023-11-22 DIAGNOSIS — I10 Essential (primary) hypertension: Secondary | ICD-10-CM

## 2023-11-22 DIAGNOSIS — Z95810 Presence of automatic (implantable) cardiac defibrillator: Secondary | ICD-10-CM

## 2023-11-22 DIAGNOSIS — I5020 Unspecified systolic (congestive) heart failure: Secondary | ICD-10-CM | POA: Diagnosis not present

## 2023-11-22 DIAGNOSIS — I255 Ischemic cardiomyopathy: Secondary | ICD-10-CM | POA: Diagnosis not present

## 2023-11-22 DIAGNOSIS — I519 Heart disease, unspecified: Secondary | ICD-10-CM | POA: Diagnosis not present

## 2023-11-22 DIAGNOSIS — R5383 Other fatigue: Secondary | ICD-10-CM | POA: Diagnosis not present

## 2023-11-22 LAB — BASIC METABOLIC PANEL WITH GFR
Anion gap: 8 (ref 5–15)
BUN: 22 mg/dL (ref 8–23)
CO2: 27 mmol/L (ref 22–32)
Calcium: 9.5 mg/dL (ref 8.9–10.3)
Chloride: 101 mmol/L (ref 98–111)
Creatinine, Ser: 1.15 mg/dL (ref 0.61–1.24)
GFR, Estimated: >60 mL/min (ref >60)
Glucose, Bld: 100 mg/dL — ABNORMAL HIGH (ref 70–99)
Potassium: 5.2 mmol/L — ABNORMAL HIGH (ref 3.5–5.1)
Sodium: 136 mmol/L (ref 135–145)

## 2023-11-22 LAB — TSH: TSH: 1.09 u[IU]/mL (ref 0.350–4.500)

## 2023-11-22 LAB — PRO BRAIN NATRIURETIC PEPTIDE: Pro Brain Natriuretic Peptide: 14804 pg/mL — ABNORMAL HIGH (ref <300.0)

## 2023-11-22 MED ORDER — BISOPROLOL FUMARATE 2.5 MG PO TABS: 2.5000 mg | ORAL_TABLET | Freq: Every day | ORAL | 3 refills | Status: DC

## 2023-11-22 NOTE — Patient Instructions (Signed)
 Medication Instructions:  Your physician has recommended you make the following change in your medication:   Start Bisoprolol  2.5 mg Daily   *If you need a refill on your cardiac medications before your next appointment, please call your pharmacy*  Lab Work: Please have this done at [facility]. (hours-Monday through Friday from 8:00 am to 4:00 pm except 11:30 am to 12:10 pm)  If you have labs (blood work) drawn today and your tests are completely normal, you will receive your results only by: MyChart Message (if you have MyChart) OR A paper copy in the mail If you have any lab test that is abnormal or we need to change your treatment, we will call you to review the results.  Testing/Procedures: None   Follow-Up: At [facility], you and your health needs are our priority.  As part of our continuing mission to provide you with exceptional heart care, our providers are all part of one team.  This team includes your primary Cardiologist (physician) and Advanced Practice Providers or APPs (Physician Assistants and Nurse Practitioners) who all work together to provide you with the care you need, when you need it.  Your next appointment:   3 month(s)  Provider:   You may see Lynwood Schilling, MD or one of the following Advanced Practice Providers on your designated Care Team:   Laymon Qua, PA-C  [place], [place] Olivia Pavy, [place]     We recommend signing up for the patient portal called MyChart.  Sign up information is provided on this After Visit Summary.  MyChart is used to connect with patients for Virtual Visits (Telemedicine).  Patients are able to view lab/test results, encounter notes, upcoming appointments, etc.  Non-urgent messages can be sent to your provider as well.   To learn more about what you can do with MyChart, go to [email or web address].   Other Instructions Thank you for choosing [facility]!

## 2023-11-23 ENCOUNTER — Encounter: Payer: Self-pay | Admitting: Family Medicine

## 2023-11-23 ENCOUNTER — Ambulatory Visit: Payer: Self-pay | Admitting: Physician Assistant

## 2023-11-23 ENCOUNTER — Ambulatory Visit (INDEPENDENT_AMBULATORY_CARE_PROVIDER_SITE_OTHER): Admitting: Family Medicine

## 2023-11-23 VITALS — BP 162/70 | HR 76 | Temp 97.0°F | Ht 70.0 in | Wt 144.6 lb

## 2023-11-23 DIAGNOSIS — E875 Hyperkalemia: Secondary | ICD-10-CM

## 2023-11-23 DIAGNOSIS — I255 Ischemic cardiomyopathy: Secondary | ICD-10-CM | POA: Diagnosis not present

## 2023-11-23 DIAGNOSIS — I10 Essential (primary) hypertension: Secondary | ICD-10-CM

## 2023-11-23 NOTE — Progress Notes (Signed)
 OFFICE VISIT  11/23/2023  CC: No chief complaint on file.   Patient is a 78 y.o. male who presents for 69-month follow-up hypertension, ischemic cardiomyopathy, and restless leg syndrome. A/P as of last visit: 1. hypertension, not ideal control but stable.  He feels weak and dizzy if his blood pressure goes below 135/75. Will continue with benazepril  20 mg twice daily. Electrolytes and creatinine today   #2 ischemic cardiomyopathy.  Asymptomatic. Appears euvolemic. Continue Farxiga  10 mg a day, benazepril  20 mg twice daily, And Lasix  20 mg twice a day. Has is status post ICD in 2021.   Electrolytes and creatinine monitoring today.   #3 restless legs syndrome, stable on alprazolam  0.25 mg, 1-2 nightly as needed.   #4 prostate cancer (approx 7 yrs cancer-free) He has not been able to follow-up with Dr. Renda recently. Will check PSA today and forward results to Dr. Renda.  INTERIM HX: Daniel [name] feels well other than feeling tired a lot. He gives out easily when walking, but this is stable.  He does not note any increase in legs swelling lately.  No chest pain, no palpitations, no dizziness.   PMP AWARE reviewed today: most recent rx for alprazolam  was filled 10/18/23, # 60, rx by me. No red flags.  ROS as above, plus--> no fevers, no CP, no SOB, no wheezing, no cough, no HAs, no rashes, no melena/hematochezia.  No polyuria or polydipsia.  No myalgias or arthralgias.  No focal weakness, paresthesias, or tremors.  No acute vision or hearing abnormalities.  No dysuria or unusual/new urinary urgency or frequency.  No n/v/d or abd pain.  No palpitations.    Past Medical History:  Diagnosis Date   Abdominal aortic aneurysm 01/2021   4 cm, infrarenal.  Also enlarged left common iliac artery--vascular eval 03/2021->rpt aortic u/s 06/01/22 stable Aorta 4.2 cm.  Bilat common iliac aneurisms and left common femoral aneurism-->f/u 1 yr   Acute prostatitis 06/19/2013   Anxiety    Atrial  fibrillation (HCC)    Limited episode, emergency room, June, 2013, spontaneous conversion to sinus rhythm, was evaluated By Dr. Micky 2013- no further visits.  Post-op (CABG) a-fib, converted on amio but pt self d/c'd this med due to DOE/side effect profile.   Blurred vision 01/01/2020   Bradycardia 06/20/2017   CAD (coronary artery disease) 05/2016   a. 05/2016: NSTEMI with cath showing 3-vessel disease. Underwent CABG on 05/12/16 with LIMA-D1, SVG-LAD, and SVG-PDA.    COPD (chronic obstructive pulmonary disease) (HCC) fall 2016   Bullous changes noted on lower lung images of CT abd done by urology   Diverticulitis    Double vision 01/2020   MG ab w/u NEG.  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 Unable to have MRI due to having shrapnel in forehead.   Hyperlipidemia    statin intolerant. Pt declined advanced lipid clinic referral 08/2019, 03/2020, and 01/2021.   Hypertension    Ischemic cardiomyopathy 05/2016   EF 20-25% at the time of NSTEMI/CABG.  Repeat EF 07/2016 30-35%.  07/2017 EF 25-30%, pt intol of entresto , bidil, and BB--for ICD 03/2019.   Microscopic hematuria Fall 2016   CT nl except nonobstructing stones.  Cystoscopy  normal 12/30/14 (Dr. 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  Urol (Dr. Beola) at [facility]; Dr. Beola referred him to Dr. Renda 03/2015: patient got bilat nerve sparing , robot assisted laparoscopic radical prostatectomy and pelvic  lymphadenectomy.  Urol f/u, PSA surveillance showing very mild uptrend of PSA but not to the level of biochemical recurrence as of 05/2021 urology follow-up   RLS (restless legs syndrome)    Tobacco dependence    down to 1-2 cigs per day as of 11/2015.  Restarted 08/2016.   Urinary retention 05/2015   occurred s/p foley removal   UTI (urinary tract infection) 11/14/2014    Past Surgical History:  Procedure Laterality Date   aortic ultrasound  07/2012   2014 NO AAA.  01/2021-->4 cm AAA with R common iliac ectasia-->vasc surg ref   COLONOSCOPY  approx 2011 per pt   Done by surgeon in [place] after a bout of diverticulitis; normal per pt report--was told to repeat in 10 yrs.   CORONARY ARTERY BYPASS GRAFT N/A 05/12/2016   Procedure: CORONARY ARTERY BYPASS GRAFTING (CABG) TIMES THREE USING LEFT INTERNAL MAMMARY ARTERY AND RIGHT SAPHENOUS LEG VEIN HARVESTED ENDOSCOPICALLY;  Surgeon: Elspeth JAYSON Millers, MD;  Location: [facility] OR;  Service: Open Heart Surgery;  Laterality: N/A;   ICD IMPLANT N/A 04/12/2019   Procedure: ICD IMPLANT;  Surgeon: Waddell Danelle ORN, MD;  Location: [facility] INVASIVE CV LAB;  Service: Cardiovascular;  Laterality: N/A;   INGUINAL HERNIA REPAIR  2003 and 2004   Both sides.   LAPAROSCOPY  2017   LEFT HEART CATH AND CORONARY ANGIOGRAPHY N/A 05/09/2016   Procedure: Left Heart Cath and Coronary Angiography;  Surgeon: Debby DELENA Sor, MD;  Location: [facility] INVASIVE CV LAB;  Service: Cardiovascular;  Laterality: N/A;   LITHOTRIPSY  2004   Dr. Verdene in [place].   LYMPHADENECTOMY Bilateral 04/30/2015   Procedure: PELVIC LYMPHADENECTOMY;  Surgeon: Gretel Renda, MD;  Location: [facility] ORS;  Service: Urology;  Laterality: Bilateral;   PROSTATE BIOPSY  02/05/2015   Prostate adenocarcinoma: pt considering treatment options as of 02/19/15   ROBOT ASSISTED LAPAROSCOPIC RADICAL PROSTATECTOMY N/A 04/30/2015   Procedure: XI ROBOTIC ASSISTED LAPAROSCOPIC RADICAL PROSTATECTOMY LEVEL 2;  Surgeon: Gretel Renda, MD;   Location: [facility] ORS;  Service: Urology;  Laterality: N/A;   TEE WITHOUT CARDIOVERSION N/A 05/12/2016   Procedure: TRANSESOPHAGEAL ECHOCARDIOGRAM (TEE);  Surgeon: Elspeth JAYSON Millers, MD;  Location: [facility] OR;  Service: Open Heart Surgery;  Laterality: N/A;   TRANSTHORACIC ECHOCARDIOGRAM  03/22/2006; 05/2016; 07/2016; 07/2017; 01/02/20   2008: EF 65%, normal valves, no wall motion abnormalties, LA size normal.  05/2016 in context of non-STEMI, EF 20-25%, mild AV and MV regurg.  08/05/16: EF 30-35%, akinesis of mid lateral myoc, grd II DD, mild aortic 07/2017: EF 25-30%, mult areas of akinesis, grd I DD. 01/2020 EF 25-30%, sev LV dsfxn, valves fine, aortic root 40mm    Outpatient Medications Prior to Visit  Medication Sig Dispense Refill   albuterol  (VENTOLIN  HFA) 108 (90 Base) MCG/ACT inhaler Inhale 1-2 puffs into the lungs every 6 (six) hours as needed for wheezing or shortness of breath. 8 g 1   ALPRAZolam  (XANAX ) 0.25 MG tablet tAKE 1 to 2 tabLETS BY MOUTH AT BEDTIME AS NEEDED  FOR insomnia/restless legs 60 tablet 1   aspirin  EC 81 MG tablet Take 1 tablet (81 mg total) by mouth daily. Swallow whole. 90 tablet 3   benazepril  (LOTENSIN ) 20 MG tablet Take 0.5 tablets (10 mg total) by mouth in the morning and at bedtime. To keep BP Levels at 140/80. If levels are lower than this, patient only takes one-half tablet     Bisoprolol  Fumarate 2.5 MG TABS Take 2.5 mg by mouth daily. 90 tablet 3   dapagliflozin  propanediol (FARXIGA ) 10 MG TABS tablet Take 10 mg by mouth daily.     furosemide  (LASIX ) 20 MG tablet Take 1 tablet (20 mg total) by mouth daily. 90 tablet 3   montelukast (SINGULAIR) 10 MG tablet Take 10 mg by mouth daily.     omeprazole (PRILOSEC) 20 MG capsule Take 20 mg by mouth daily as needed (for acid reflux).     predniSONE  (DELTASONE ) 20 MG tablet Take 2 tablets by mouth daily x 2 days; then 1 tablet by mouth daily x 2 days; then 1/2 tablet by mouth daily x 3 days and stop prednisone . 9 tablet 0    levocetirizine (XYZAL) 5 MG tablet Take 5 mg by mouth every evening. (Patient not taking: Reported on 11/23/2023)     nicotine  (NICODERM CQ  - DOSED IN MG/24 HOURS) 14 mg/24hr patch Place 1 patch (14 mg total) onto the skin daily. (Patient not taking: Reported on 11/23/2023) 28 patch 0   No facility-administered medications prior to visit.    Allergies  Allergen Reactions   Entresto  [Sacubitril -Valsartan ] Hives and Shortness Of Breath   Amlodipine  Swelling    LE swelling   Contrast Media [Iodinated Contrast Media] Other (See Comments)    Prostate problem had to wear a catheter for two weeks after having dye.    Hydralazine  Palpitations and Other (See Comments)    Fatigue+   Carvedilol      Bradycardia   Spironolactone      Hyperkalemia     Review of Systems As per HPI  PE:    11/23/2023   10:37 AM 11/22/2023    1:02 PM 11/03/2023   10:24 AM  Vitals with BMI  Height 5' 10 5' 10   Weight 144 lbs 10 oz 144 lbs 3 oz   BMI 20.75 20.69   Systolic 162 134 879  Diastolic 70 88 70  Pulse 76 82 74     Physical Exam  General: Alert and well-appearing. Cardiovascular: Regular rhythm, occasional ectopy, rate 70, soft systolic murmur.  No diastolic murmur. Lungs clear bilaterally, nonlabored respirations. Extremities show 2-3+ pitting edema in the ankles bilaterally  LABS:  Last CBC Lab Results  Component Value Date   WBC 4.8 10/23/2023   HGB 16.5 10/23/2023   HCT 49.5 10/23/2023   MCV 96.5 10/23/2023   MCH 32.2 10/23/2023   RDW 14.7 10/23/2023   PLT 131 (L) 10/23/2023   Last metabolic panel Lab Results  Component Value Date   GLUCOSE 100 (H) 11/22/2023   NA 136 11/22/2023   K 5.2 (H) 11/22/2023   CL 101 11/22/2023   CO2 27 11/22/2023   BUN 22 11/22/2023   CREATININE 1.15 11/22/2023   GFRNONAA >60 11/22/2023   CALCIUM  9.5 11/22/2023   PHOS 3.5 10/25/2023   PROT 6.4 (L) 10/23/2023   ALBUMIN  3.4 (L) 10/25/2023   LABGLOB 2.2 09/16/2016   AGRATIO 2.0 09/16/2016    BILITOT 1.6 (H) 10/23/2023   ALKPHOS 65 10/23/2023   AST 28 10/23/2023  ALT 20 10/23/2023   ANIONGAP 8 11/22/2023   Last lipids Lab Results  Component Value Date   CHOL 156 05/24/2023   HDL 38.40 (L) 05/24/2023   LDLCALC 106 (H) 05/24/2023   TRIG 61.0 05/24/2023   CHOLHDL 4 05/24/2023   Last hemoglobin A1c Lab Results  Component Value Date   HGBA1C 5.2 01/02/2020   Last thyroid  functions Lab Results  Component Value Date   TSH 1.090 11/22/2023   THYROIDAB <8 01/02/2020   IMPRESSION AND PLAN:  1 hypertension, not ideal control but stable.  He feels weak and dizzy if his blood pressure goes below 135/75. Cardiology has decreased his benazepril  to one half of the 20 mg tab every morning and an additional half tab in the evening if blood pressure is elevated.  They also started bisoprolol  2.5 mg daily yesterday and he is about to pick this up today. He will monitor blood pressure at home and we will review these in a month. His potassium yesterday was 5.2 so I am hesitant to increase his ACE-I dosing.  #2 ischemic cardiomyopathy.  Ejection fraction 20% with wall motion abnormalities September 2025.  RV function moderately reduced.  Moderate to severe mitral valve regurgitation, aortic valve regurgitation mild.   Appears euvolemic. Continue Farxiga  10 mg a day, benazepril  10 mg every day-bid daily, And Lasix  20 mg twice a day.  Bisoprolol  2.5 mg started by cardiologist yesterday. Has is status post ICD in 2021.    #3 hyperkalemia. Will keep his benazepril  dose to 10 mg 1-2 times a day. I want to see his potassium again next week to make sure it is stable.  An After Visit Summary was printed and given to the patient.  FOLLOW UP: Return for lab visit 5-7d for bmet.  Office f/u 1 month. Next CPE April 2026 Signed:  Gerlene Hockey, MD           11/23/2023

## 2023-11-27 ENCOUNTER — Telehealth: Payer: Self-pay | Admitting: Physician Assistant

## 2023-11-27 DIAGNOSIS — I5020 Unspecified systolic (congestive) heart failure: Secondary | ICD-10-CM

## 2023-11-27 NOTE — Telephone Encounter (Signed)
 Pt c/o medication issue:  1. Name of Medication: Bisoprolol  Fumarate 2.5 MG TABS   2. How are you currently taking this medication (dosage and times per day)? 1 tablet daily  3. Are you having a reaction (difficulty breathing--STAT)?   4. What is your medication issue? Patient called stating he can't sleep at night, and feels weak, and head feels weird.

## 2023-11-27 NOTE — Telephone Encounter (Signed)
 Patient current BP is 138/82,HR 74   He started Bisoprol on Friday 10/24, felt good,did some curator work on a car. Saturday morning he felt weak and stated his head felt funny, best described as dizziness and continues to feel weak.  He takes bisoprolol  in the morning   Please advise.

## 2023-11-27 NOTE — Telephone Encounter (Signed)
 Daniel [name] confirmed the BP was taken when he had symptoms. Symptoms began Saturday morning, described as weak legs and dizziness, no change with positions. Symptoms are all the time, no c/o SOB, CP, or palpitations. He stopped the bisoprolol  on Sunday. He says he will increase his water  intake to 64 ounces and add the flavor packets to his water .

## 2023-11-28 NOTE — Telephone Encounter (Signed)
 Daniel [name] endorses continued fatigue even after stopping bisoprolol . He increased his water  intake, says he is floating . He will monitor BP for the next week and notify us  if his SBP < 90 or >160 consistently .Order for BNP to be done on 11/29/24 visit with PCP

## 2023-11-30 ENCOUNTER — Other Ambulatory Visit (INDEPENDENT_AMBULATORY_CARE_PROVIDER_SITE_OTHER)

## 2023-11-30 DIAGNOSIS — I10 Essential (primary) hypertension: Secondary | ICD-10-CM

## 2023-11-30 DIAGNOSIS — E875 Hyperkalemia: Secondary | ICD-10-CM

## 2023-11-30 LAB — BASIC METABOLIC PANEL WITH GFR
BUN: 19 mg/dL (ref 6–23)
CO2: 30 meq/L (ref 19–32)
Calcium: 9.3 mg/dL (ref 8.4–10.5)
Chloride: 101 meq/L (ref 96–112)
Creatinine, Ser: 1.01 mg/dL (ref 0.40–1.50)
GFR: 71.2 mL/min (ref >60.00)
Glucose, Bld: 124 mg/dL — ABNORMAL HIGH (ref 70–99)
Potassium: 3.8 meq/L (ref 3.5–5.1)
Sodium: 139 meq/L (ref 135–145)

## 2023-12-01 ENCOUNTER — Ambulatory Visit: Payer: Self-pay | Admitting: Family Medicine

## 2023-12-04 NOTE — Telephone Encounter (Signed)
 Pt returning call

## 2023-12-19 ENCOUNTER — Ambulatory Visit: Attending: Internal Medicine | Admitting: Internal Medicine

## 2023-12-19 ENCOUNTER — Encounter: Payer: Self-pay | Admitting: Internal Medicine

## 2023-12-19 VITALS — BP 116/92 | HR 82 | Ht 70.0 in | Wt 152.0 lb

## 2023-12-19 DIAGNOSIS — I5022 Chronic systolic (congestive) heart failure: Secondary | ICD-10-CM

## 2023-12-19 LAB — CUP PACEART INCLINIC DEVICE CHECK
Date Time Interrogation Session: 20251118213909
Implantable Lead Connection Status: 753985
Implantable Lead Connection Status: 753985
Implantable Lead Implant Date: 20210312
Implantable Lead Implant Date: 20210312
Implantable Lead Location: 753859
Implantable Lead Location: 753860
Implantable Pulse Generator Implant Date: 20210312
Pulse Gen Serial Number: 111016652

## 2023-12-19 NOTE — Patient Instructions (Signed)
 Medication Instructions:   Hold Farxiga  3 days prior to your procedure.   *If you need a refill on your cardiac medications before your next appointment, please call your pharmacy*  Lab Work: Your physician recommends that you return for lab work on 01/01/24  Please have this done at [facility]. (hours-Monday through Friday from 8:00 am to 4:00 pm except 11:30 am to 12:10 pm)  If you have labs (blood work) drawn today and your tests are completely normal, you will receive your results only by: MyChart Message (if you have MyChart) OR A paper copy in the mail If you have any lab test that is abnormal or we need to change your treatment, we will call you to review the results.  Testing/Procedures: Your physician has recommended that you have a defibrillator inserted. An implantable cardioverter defibrillator (ICD) is a small device that is placed in your chest or, in rare cases, your abdomen. This device uses electrical pulses or shocks to help control life-threatening, irregular heartbeats that could lead the heart to suddenly stop beating (sudden cardiac arrest). Leads are attached to the ICD that goes into your heart. This is done in the hospital and usually requires an overnight stay. Please see the instruction sheet given to you today for more information.   Follow-Up: At [facility], you and your health needs are our priority.  As part of our continuing mission to provide you with exceptional heart care, our providers are all part of one team.  This team includes your primary Cardiologist (physician) and Advanced Practice Providers or APPs (Physician Assistants and Nurse Practitioners) who all work together to provide you with the care you need, when you need it.  Your next appointment:   3 month(s)  Provider:   Donnice Primus, MD    We recommend signing up for the patient portal called MyChart.  Sign up information is provided on this After Visit Summary.  MyChart is  used to connect with patients for Virtual Visits (Telemedicine).  Patients are able to view lab/test results, encounter notes, upcoming appointments, etc.  Non-urgent messages can be sent to your provider as well.   To learn more about what you can do with MyChart, go to [email or web address].   Other Instructions Thank you for choosing [facility]!        Implantable Device Instructions    Dejan Angert.  12/19/2023  You are scheduled for a Implantable cardioverter defibrilator (ICD) on Tuesday, December 30 with Dr. Danelle Birmingham.  1. Pre procedure Lab testing: [facility]    2. Please arrive at the [facility] (Main Entrance A) at [facility]: [address] [place], [place] [address] at 5:30 AM (This time is 2 hour(s) before your procedure to ensure your preparation).   Free valet parking service is available. You will check in at ADMITTING. The support person will be asked to wait in the waiting room.  It is OK to have someone drop you off and come back when you are ready to be discharged.          Special note: Every effort is made to have your procedure done on time. Please understand that emergencies sometimes delay  scheduled procedures.  3.  No eating or drinking after midnight prior to procedure.     4.  Medication instructions:  On the morning of your procedure Do not Take your Farxiga .   Do not Take Farxiga  for 3 days prior to  your procedure.       !!IF ANY NEW MEDICATIONS ARE STARTED AFTER TODAY, PLEASE NOTIFY YOUR PROVIDER AS SOON AS POSSIBLE!!  FYI: Medications such as Semaglutide (Ozempic, Wegovy), Tirzepatide (Mounjaro, Zepbound), Dulaglutide (Trulicity), etc (GLP1 agonists) AND Canagliflozin (Invokana), Dapagliflozin  (Farxiga ), Empagliflozin (Jardiance), Ertugliflozin (Steglatro), Bexagliflozin [employer]) or any combination with one of these drugs such as Invokamet (Canagliflozin/Metformin), Synjardy (Empagliflozin/Metformin), etc (SGLT2  inhibitors) must be held around the time of a procedure. This is not a comprehensive list of all of these drugs. Please review all of your medications and talk to your provider if you take any one of these. If you are not sure, ask your provider.   5.  The night before your procedure and the morning of your procedure scrub your neck/chest with CHG surgical scrub.  See instruction letter.  6. Plan to go home the same day, you will only stay overnight if medically necessary. 7. You MUST have a responsible adult to drive you home. 8. An adult MUST be with you the first 24 hours after you arrive home. 9. Bring a current list of your medications, and the last time and date medication taken. 10. Bring ID and current insurance cards. 11. Please wear clothes that are easy to get on and off and wear slip-on shoes.    You will follow up with the [facility] Device clinic 10-14 days after your procedure.  You will follow up with Dr. Danelle Birmingham 91 days after your procedure.  These appointments will be made for you.   * If you have ANY questions after you get home, please call the office at [phone] or send a MyChart message.  FYI: For your safety, and to allow us  to monitor your vital signs accurately during the surgery/procedure we request that if you have artificial nails, gel coating, SNS etc. Please have those removed prior to your surgery/procedure. Not having the nail coverings /polish removed may result in cancellation or delay of your surgery/procedure.    [facility] - Preparing For Surgery    Before surgery, you can play an important role. Because skin is not sterile, your skin needs to be as free of germs as possible. You can reduce the number of germs on your skin by washing with CHG (chlorahexidine gluconate) Soap before surgery.  CHG is an antiseptic cleaner which kills germs and bonds with the skin to continue killing germs even after washing.  Please do not use if you have an allergy  to CHG or antibacterial soaps.  If your skin becomes reddened/irritated stop using the CHG.   Do not shave (including legs and underarms) for at least 48 hours prior to first CHG shower.  It is OK to shave your face.  Please follow these instructions carefully:  1.  Shower the night before surgery and the morning of surgery with CHG.  2.  If you choose to wash your hair, wash your hair first as usual with your normal shampoo.  3.  After you shampoo, rinse your hair and body thoroughly to remove the shampoo.  4.  Use CHG as you would any other liquid soap.  You can apply CHG directly to the skin and wash gently with a clean washcloth. 5.  Apply the CHG Soap to your body ONLY FROM THE NECK DOWN.  Do not use on open wounds or open sores.  Avoid contact with your eyes, ears, mouth and genitals (private parts).  Wash genitals (private parts) with your normal soap.  6.  Wash thoroughly, paying special attention to the area where your surgery will be performed.  7.  Thoroughly rinse your body with warm water  from the neck down.   8.  DO NOT shower/wash with your normal soap after using and rinsing off the CHG soap.  9.  Pat yourself dry with a clean towel.           10.  Wear clean pajamas.           11.  Place clean sheets on your bed the night of your first shower and do not sleep with pets.  Day of Surgery: Do not apply any deodorants/lotions.  Please wear clean clothes to the hospital/surgery center.

## 2023-12-19 NOTE — Progress Notes (Signed)
 HPI Daniel [name] returns today for followup. He is a pleasant 78 yo man with a h/o CAD, s/p MI, Chronic systolic heart failure, EF 20%, who underwent ICD insertion 5 years ago. He had a lot of PVCs after his procedure which could be supressed by pacing at 75/min. He feels ok at rest but notes that he gets tired quickly when he walks. No ICD therapies and no palpitations. He is now pacing 70% in the RV. He had been on amiodarone  previously but is not now. He had been taken off coreg .  Allergies  Allergen Reactions   Entresto  [Sacubitril -Valsartan ] Hives and Shortness Of Breath   Amlodipine  Swelling    LE swelling   Contrast Media [Iodinated Contrast Media] Other (See Comments)    Prostate problem had to wear a catheter for two weeks after having dye.    Hydralazine  Palpitations and Other (See Comments)    Fatigue+   Carvedilol      Bradycardia   Spironolactone      Hyperkalemia      Current Outpatient Medications  Medication Sig Dispense Refill   albuterol  (VENTOLIN  HFA) 108 (90 Base) MCG/ACT inhaler Inhale 1-2 puffs into the lungs every 6 (six) hours as needed for wheezing or shortness of breath. 8 g 1   ALPRAZolam  (XANAX ) 0.25 MG tablet tAKE 1 to 2 tabLETS BY MOUTH AT BEDTIME AS NEEDED FOR insomnia/restless legs 60 tablet 1   aspirin  EC 81 MG tablet Take 1 tablet (81 mg total) by mouth daily. Swallow whole. 90 tablet 3   benazepril  (LOTENSIN ) 20 MG tablet Take 0.5 tablets (10 mg total) by mouth in the morning and at bedtime. To keep BP Levels at 140/80. If levels are lower than this, patient only takes one-half tablet     Bisoprolol  Fumarate 2.5 MG TABS Take 2.5 mg by mouth daily. 90 tablet 3   dapagliflozin  propanediol (FARXIGA ) 10 MG TABS tablet Take 10 mg by mouth daily.     furosemide  (LASIX ) 20 MG tablet Take 1 tablet (20 mg total) by mouth daily. 90 tablet 3   montelukast (SINGULAIR) 10 MG tablet Take 10 mg by mouth daily.     omeprazole (PRILOSEC) 20 MG capsule Take 20 mg by  mouth daily as needed (for acid reflux).     No current facility-administered medications for this visit.     Past Medical History:  Diagnosis Date   Abdominal aortic aneurysm 01/2021   4 cm, infrarenal.  Also enlarged left common iliac artery--vascular eval 03/2021->rpt aortic u/s 06/01/22 stable Aorta 4.2 cm.  Bilat common iliac aneurisms and left common femoral aneurism-->f/u 1 yr   Acute prostatitis 06/19/2013   Anxiety    Atrial fibrillation (HCC)    Limited episode, emergency room, June, 2013, spontaneous conversion to sinus rhythm, was evaluated By Dr. Micky 2013- no further visits.  Post-op (CABG) a-fib, converted on amio but pt self d/c'd this med due to DOE/side effect profile.   Blurred vision 01/01/2020   Bradycardia 06/20/2017   CAD (coronary artery disease) 05/2016   a. 05/2016: NSTEMI with cath showing 3-vessel disease. Underwent CABG on 05/12/16 with LIMA-D1, SVG-LAD, and SVG-PDA.    COPD (chronic obstructive pulmonary disease) (HCC) fall 2016   Bullous changes noted on lower lung images of CT abd done by urology   Diverticulitis    Double vision 01/2020   MG ab w/u NEG.  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 Unable to have MRI due to having shrapnel in forehead.   Hyperlipidemia    statin intolerant. Pt declined advanced lipid clinic referral 08/2019, 03/2020, and 01/2021.   Hypertension    Ischemic cardiomyopathy 05/2016   EF 20-25% at the time of NSTEMI/CABG.  Repeat EF 07/2016 30-35%.  07/2017 EF 25-30%, pt intol of entresto , bidil, and BB--for ICD 03/2019.   Microscopic hematuria Fall 2016   CT nl except nonobstructing stones.  Cystoscopy normal 12/30/14  (Dr. 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  Urol (Dr. Beola) at [facility]; Dr. Beola referred him to Dr. Renda 03/2015: patient got bilat nerve sparing , robot assisted laparoscopic radical prostatectomy and pelvic lymphadenectomy.  Urol f/u, PSA surveillance showing very mild uptrend of PSA but not to the level of biochemical recurrence as of 05/2021 urology follow-up   RLS (restless legs syndrome)    Statin intolerance    Tobacco dependence    down to 1-2 cigs per day as of 11/2015.  Restarted 08/2016.   Urinary retention 05/2015   occurred s/p foley removal   UTI (urinary tract infection) 11/14/2014    ROS:   All systems reviewed and negative except as noted in the HPI.   Past Surgical History:  Procedure Laterality Date   aortic ultrasound  07/2012   2014 NO AAA.  01/2021-->4 cm AAA with R common iliac ectasia-->vasc surg ref   COLONOSCOPY  approx 2011 per pt   Done by surgeon in [place] after a bout of diverticulitis; normal per pt report--was told to repeat in 10 yrs.   CORONARY ARTERY BYPASS GRAFT N/A 05/12/2016   Procedure: CORONARY ARTERY BYPASS GRAFTING (CABG) TIMES THREE USING LEFT INTERNAL MAMMARY ARTERY AND RIGHT SAPHENOUS LEG VEIN HARVESTED ENDOSCOPICALLY;  Surgeon: Elspeth JAYSON Millers, MD;  Location: [facility] OR;  Service: Open Heart Surgery;  Laterality: N/A;   ICD IMPLANT N/A 04/12/2019   Procedure: ICD IMPLANT;  Surgeon: Waddell Danelle ORN, MD;  Location: [facility] INVASIVE CV LAB;  Service: Cardiovascular;  Laterality: N/A;   INGUINAL HERNIA REPAIR  2003 and 2004   Both sides.   LAPAROSCOPY  2017   LEFT HEART CATH AND CORONARY ANGIOGRAPHY N/A 05/09/2016   Procedure: Left Heart Cath and Coronary Angiography;  Surgeon: Debby DELENA Sor, MD;  Location: [facility] INVASIVE  CV LAB;  Service: Cardiovascular;  Laterality: N/A;   LITHOTRIPSY  2004   Dr. Verdene in [place].   LYMPHADENECTOMY Bilateral 04/30/2015   Procedure: PELVIC LYMPHADENECTOMY;  Surgeon: Gretel Renda, MD;  Location: [facility] ORS;  Service: Urology;  Laterality: Bilateral;   PROSTATE BIOPSY  02/05/2015   Prostate adenocarcinoma: pt considering treatment options as of 02/19/15   ROBOT ASSISTED LAPAROSCOPIC RADICAL PROSTATECTOMY N/A 04/30/2015   Procedure: XI ROBOTIC ASSISTED LAPAROSCOPIC RADICAL PROSTATECTOMY LEVEL 2;  Surgeon: Gretel Renda, MD;  Location: [facility] ORS;  Service: Urology;  Laterality: N/A;   TEE WITHOUT CARDIOVERSION N/A 05/12/2016   Procedure: TRANSESOPHAGEAL ECHOCARDIOGRAM (TEE);  Surgeon: Elspeth JAYSON Millers, MD;  Location: [facility]  OR;  Service: Open Heart Surgery;  Laterality: N/A;   TRANSTHORACIC ECHOCARDIOGRAM  03/22/2006; 05/2016; 07/2016; 07/2017; 01/02/20   2008: EF 65%, normal valves, no wall motion abnormalties, LA size normal.  05/2016 in context of non-STEMI, EF 20-25%, mild AV and MV regurg.  08/05/16: EF 30-35%, akinesis of mid lateral myoc, grd II DD, mild aortic 07/2017: EF 25-30%, mult areas of akinesis, grd I DD. 01/2020 EF 25-30%, sev LV dsfxn, valves fine, aortic root 40mm     Family History  Problem Relation Age of Onset   Hypertension Mother    Cancer - Other Mother 25       breast   Cancer Father        Lung ca age 41   Diabetes Sister      Social History   Socioeconomic History   Marital status: Married    Spouse name: Not on file   Number of children: Not on file   Years of education: Not on file   Highest education level: Not on file  Occupational History   Not on file  Tobacco Use   Smoking status: Some Days    Current packs/day: 1.00    Average packs/day: 1 pack/day for 55.0 years (55.0 ttl pk-yrs)    Types: Cigarettes   Smokeless tobacco: Never   Tobacco comments:    quit in 2017  Vaping Use   Vaping status: Never Used  Substance and Sexual Activity    Alcohol use: No   Drug use: No   Sexual activity: Not on file  Other Topics Concern   Not on file  Social History Narrative   Married, 2 grown children.HS education. Orig from [place], lives there now.Occupation: maintenance.  Drives a tractor a lot, drives a pickup truck a lot, rides horses a lot.Tobacco: 20 pack-yr hx (current as of 07/2012)No drugs.Alcohol: none   Social Drivers of Corporate Investment Banker Strain: Low Risk  (09/29/2021)   Overall Financial Resource Strain (CARDIA)    Difficulty of Paying Living Expenses: Not hard at all  Food Insecurity: No Food Insecurity (10/23/2023)   Hunger Vital Sign    Worried About Running Out of Food in the Last Year: Never true    Ran Out of Food in the Last Year: Never true  Transportation Needs: No Transportation Needs (10/23/2023)   PRAPARE - Administrator, Civil Service (Medical): No    Lack of Transportation (Non-Medical): No  Physical Activity: Inactive (09/29/2021)   Exercise Vital Sign    Days of Exercise per Week: 0 days    Minutes of Exercise per Session: 0 min  Stress: No Stress Concern Present (09/29/2021)   [employer] of Occupational Health - Occupational Stress Questionnaire    Feeling of Stress : Not at all  Social Connections: Socially Integrated (10/23/2023)   Social Connection and Isolation Panel    Frequency of Communication with Friends and Family: More than three times a week    Frequency of Social Gatherings with Friends and Family: Twice a week    Attends Religious Services: More than 4 times per year    Active Member of [employer] or Organizations: Yes    Attends Engineer, Structural: More than 4 times per year    Marital Status: Married  Catering Manager Violence: Not At Risk (10/23/2023)   Humiliation, Afraid, Rape, and Kick questionnaire    Fear of Current or Ex-Partner: No    Emotionally Abused: No    Physically Abused:  No    Sexually Abused: No     BP (!) 116/92   Pulse 82    Ht 5' 10 (1.778 m)   Wt 152 lb (68.9 kg)   SpO2 94%   BMI 21.81 kg/m   Physical Exam:  Well appearing NAD HEENT: Unremarkable Neck:  No JVD, no thyromegally Lymphatics:  No adenopathy Back:  No CVA tenderness Lungs:  Clear HEART:  Regular rate rhythm, no murmurs, no rubs, no clicks Abd:  soft, positive bowel sounds, no organomegally, no rebound, no guarding Ext:  2 plus pulses, no edema, no cyanosis, no clubbing Skin:  No rashes no nodules Neuro:  CN II through XII intact, motor grossly intact   DEVICE  Normal device function.  See PaceArt for details.   Assess/Plan:  Chronic systolic heart faiure - his symptoms are worse. He has RV pacing induced LBBB and his EF is 20%. He is now pacing about 70% in the RV. He will continue GDMT. I will have him come in for attempted CRT upgrade. 2. CAD - he denies anginal symptoms. He is s/p CABG. 3. PVC's - these are improved.  4. ICD - his St. Jude DDD ICD is working normally. 3 years of battery longevity.   Danelle Rooney Gladwin,MD

## 2023-12-20 ENCOUNTER — Telehealth: Payer: Self-pay | Admitting: *Deleted

## 2023-12-20 NOTE — Telephone Encounter (Signed)
 High alert received from [employer]:  Alert remote transmission: Successful ATP delivered Event occurred 11/18 @ 18:23, HR 206, EGM c/w sustained VT pace terminated with 1 burst of ATP - route to triage HF diagnostics currently abnormal Follow up as scheduled. LA, CVRS ________________________________________________________  Called patient to assess for symptoms  Patient denied experiencing any symptoms during time of event and stated that he feels fine today  Patient seen in clinic yesterday with Dr. Waddell (see 11-18 OV note)  This RN notified patient of 6 month driving restrictions starting today  Patient verbalized understanding of driving restrictions  Patient appreciative of the phone call by this RN   Routing to MD for awareness and advisement

## 2023-12-21 ENCOUNTER — Ambulatory Visit: Admitting: Family Medicine

## 2023-12-21 ENCOUNTER — Encounter: Payer: Self-pay | Admitting: Family Medicine

## 2023-12-21 NOTE — Progress Notes (Unsigned)
 OFFICE VISIT  12/21/2023  CC:  Chief Complaint  Patient presents with   Medical Management of Chronic Issues   Patient is a 78 y.o. male who presents for 1 mo f/u HTN. A/P as of last visit:  hypertension, not ideal control but stable.  He feels weak and dizzy if his blood pressure goes below 135/75. Cardiology has decreased his benazepril  to one half of the 20 mg tab every morning and an additional half tab in the evening if blood pressure is elevated.  They also started bisoprolol  2.5 mg daily yesterday and he is about to pick this up today. He will monitor blood pressure at home and we will review these in a month. His potassium yesterday was 5.2 so I am hesitant to increase his ACE-I dosing.   #2 ischemic cardiomyopathy.  Ejection fraction 20% with wall motion abnormalities September 2025.  RV function moderately reduced.  Moderate to severe mitral valve regurgitation, aortic valve regurgitation mild.   Appears euvolemic. Continue Farxiga  10 mg a day, benazepril  10 mg every day-bid daily, And Lasix  20 mg twice a day.  Bisoprolol  2.5 mg started by cardiologist yesterday. Has is status post ICD in 2021.     #3 hyperkalemia. Will keep his benazepril  dose to 10 mg 1-2 times a day. I want to see his potassium again next week to make sure it is stable.  INTERIM HX: BMET normal 11/30/23. ***  Cardiology plans to do CRT upgrade on 01/30/2024.  Past Medical History:  Diagnosis Date   Abdominal aortic aneurysm 01/2021   4 cm, infrarenal.  Also enlarged left common iliac artery--vascular eval 03/2021->rpt aortic u/s 06/01/22 stable Aorta 4.2 cm.  Bilat common iliac aneurisms and left common femoral aneurism-->f/u 1 yr   Acute prostatitis 06/19/2013   Anxiety    Atrial fibrillation (HCC)    Limited episode, emergency room, June, 2013, spontaneous conversion to sinus rhythm, was evaluated By Dr. Micky 2013- no further visits.  Post-op (CABG) a-fib, converted on amio but pt self d/c'd this  med due to DOE/side effect profile.   Blurred vision 01/01/2020   Bradycardia 06/20/2017   CAD (coronary artery disease) 05/2016   a. 05/2016: NSTEMI with cath showing 3-vessel disease. Underwent CABG on 05/12/16 with LIMA-D1, SVG-LAD, and SVG-PDA.    COPD (chronic obstructive pulmonary disease) (HCC) fall 2016   Bullous changes noted on lower lung images of CT abd done by urology   Diverticulitis    Double vision 01/2020   MG ab w/u NEG.  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 Unable to have MRI due to having shrapnel in forehead.   Hyperlipidemia    statin intolerant. Pt declined advanced lipid clinic referral 08/2019, 03/2020, and 01/2021.   Hypertension    Ischemic cardiomyopathy 05/2016   EF 20-25% at the time of NSTEMI/CABG.  Repeat EF 07/2016 30-35%.  07/2017 EF 25-30%, pt intol of entresto , bidil, and BB--for ICD 03/2019.   Microscopic hematuria Fall 2016   CT nl except nonobstructing stones.  Cystoscopy normal 12/30/14 (Dr. 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  Urol (Dr. Beola) at [facility]; Dr. Beola referred him to Dr. Renda 03/2015: patient got bilat nerve sparing , robot assisted laparoscopic radical prostatectomy and pelvic lymphadenectomy.  Urol f/u, PSA surveillance showing very mild uptrend of PSA but not to the level of biochemical recurrence as of 05/2021 urology follow-up   RLS (restless legs syndrome)    Statin intolerance    Tobacco  dependence    down to 1-2 cigs per day as of 11/2015.  Restarted 08/2016.   Urinary retention 05/2015   occurred s/p foley removal   UTI (urinary tract infection) 11/14/2014    Past Surgical History:  Procedure Laterality Date   aortic ultrasound  07/2012   2014 NO AAA.  01/2021-->4 cm AAA with R common iliac ectasia-->vasc surg ref   COLONOSCOPY  approx 2011 per pt   Done by surgeon in [place] after a bout of diverticulitis; normal per pt report--was told to repeat in 10 yrs.   CORONARY ARTERY BYPASS GRAFT N/A 05/12/2016   Procedure: CORONARY ARTERY BYPASS GRAFTING (CABG) TIMES THREE USING LEFT INTERNAL MAMMARY ARTERY AND RIGHT SAPHENOUS LEG VEIN HARVESTED ENDOSCOPICALLY;  Surgeon: Elspeth JAYSON Millers, MD;  Location: [facility] OR;  Service: Open Heart Surgery;  Laterality: N/A;   ICD IMPLANT N/A 04/12/2019   Procedure: ICD IMPLANT;  Surgeon: Waddell Danelle ORN, MD;  Location: [facility] INVASIVE CV LAB;  Service: Cardiovascular;  Laterality: N/A;   INGUINAL HERNIA REPAIR  2003 and 2004   Both sides.   LAPAROSCOPY  2017   LEFT HEART CATH AND CORONARY ANGIOGRAPHY N/A 05/09/2016   Procedure: Left Heart Cath and Coronary Angiography;  Surgeon: Debby DELENA Sor, MD;  Location: [facility] INVASIVE CV LAB;  Service: Cardiovascular;  Laterality: N/A;   LITHOTRIPSY  2004   Dr. Verdene in [place].   LYMPHADENECTOMY Bilateral 04/30/2015   Procedure: PELVIC LYMPHADENECTOMY;  Surgeon: Gretel Renda, MD;  Location: [facility] ORS;  Service: Urology;  Laterality: Bilateral;   PROSTATE BIOPSY  02/05/2015   Prostate adenocarcinoma: pt considering treatment options as of 02/19/15   ROBOT ASSISTED LAPAROSCOPIC RADICAL PROSTATECTOMY N/A 04/30/2015   Procedure: XI ROBOTIC ASSISTED LAPAROSCOPIC RADICAL PROSTATECTOMY LEVEL 2;  Surgeon: Gretel Renda, MD;  Location: [facility] ORS;  Service: Urology;  Laterality: N/A;   TEE WITHOUT CARDIOVERSION N/A 05/12/2016   Procedure: TRANSESOPHAGEAL ECHOCARDIOGRAM (TEE);  Surgeon: Elspeth JAYSON Millers, MD;  Location:  [facility] OR;  Service: Open Heart Surgery;  Laterality: N/A;   TRANSTHORACIC ECHOCARDIOGRAM  03/22/2006; 05/2016; 07/2016; 07/2017; 01/02/20   2008: EF 65%, normal valves, no wall motion abnormalties, LA size normal.  05/2016 in context of non-STEMI, EF 20-25%, mild AV and MV regurg.  08/05/16: EF 30-35%, akinesis of mid lateral myoc, grd II DD, mild aortic 07/2017: EF 25-30%, mult areas of akinesis, grd I DD. 01/2020 EF 25-30%, sev LV dsfxn, valves fine, aortic root 40mm    Outpatient Medications Prior to Visit  Medication Sig Dispense Refill   albuterol  (VENTOLIN  HFA) 108 (90 Base) MCG/ACT inhaler Inhale 1-2 puffs into the lungs every 6 (six) hours as needed for wheezing or shortness of breath. 8 g 1   ALPRAZolam  (XANAX ) 0.25 MG tablet tAKE 1 to 2 tabLETS BY MOUTH AT BEDTIME AS NEEDED FOR insomnia/restless legs (Patient taking differently: as needed. tAKE 1 to 2 tabLETS BY MOUTH AT BEDTIME AS NEEDED FOR insomnia/restless legs) 60 tablet  1   aspirin  EC 81 MG tablet Take 1 tablet (81 mg total) by mouth daily. Swallow whole. 90 tablet 3   benazepril  (LOTENSIN ) 20 MG tablet Take 0.5 tablets (10 mg total) by mouth in the morning and at bedtime. To keep BP Levels at 140/80. If levels are lower than this, patient only takes one-half tablet     Bisoprolol  Fumarate 2.5 MG TABS Take 2.5 mg by mouth daily. 90 tablet 3   dapagliflozin  propanediol (FARXIGA ) 10 MG TABS tablet Take 10 mg by mouth daily.     furosemide  (LASIX ) 20 MG tablet Take 1 tablet (20 mg total) by mouth daily. 90 tablet 3   montelukast (SINGULAIR) 10 MG tablet Take 10 mg by mouth daily.     omeprazole (PRILOSEC) 20 MG capsule Take 20 mg by mouth daily as needed (for acid reflux).     No facility-administered medications prior to visit.    Allergies  Allergen Reactions   Entresto  [Sacubitril -Valsartan ] Hives and Shortness Of Breath   Amlodipine  Swelling    LE swelling   Contrast Media [Iodinated Contrast Media] Other (See Comments)    Prostate  problem had to wear a catheter for two weeks after having dye.    Hydralazine  Palpitations and Other (See Comments)    Fatigue+   Carvedilol      Bradycardia   Spironolactone      Hyperkalemia     Review of Systems As per HPI  PE:    12/21/2023   10:47 AM 12/19/2023    2:01 PM 11/23/2023   10:37 AM  Vitals with BMI  Height  5' 10 5' 10  Weight 150 lbs 152 lbs 144 lbs 10 oz  BMI 21.52 21.81 20.75  Systolic 131 116 837  Diastolic 80 92 70  Pulse 74 82 76   Physical Exam  ***  LABS:  Last CBC Lab Results  Component Value Date   WBC 4.8 10/23/2023   HGB 16.5 10/23/2023   HCT 49.5 10/23/2023   MCV 96.5 10/23/2023   MCH 32.2 10/23/2023   RDW 14.7 10/23/2023   PLT 131 (L) 10/23/2023   Last metabolic panel Lab Results  Component Value Date   GLUCOSE 124 (H) 11/30/2023   NA 139 11/30/2023   K 3.8 11/30/2023   CL 101 11/30/2023   CO2 30 11/30/2023   BUN 19 11/30/2023   CREATININE 1.01 11/30/2023   GFR 71.20 11/30/2023   CALCIUM  9.3 11/30/2023   PHOS 3.5 10/25/2023   PROT 6.4 (L) 10/23/2023   ALBUMIN  3.4 (L) 10/25/2023   LABGLOB 2.2 09/16/2016   AGRATIO 2.0 09/16/2016   BILITOT 1.6 (H) 10/23/2023   ALKPHOS 65 10/23/2023   AST 28 10/23/2023   ALT 20 10/23/2023   ANIONGAP 8 11/22/2023   IMPRESSION AND PLAN:  No problem-specific Assessment & Plan notes found for this encounter.   An After Visit Summary was printed and given to the patient.  FOLLOW UP: No follow-ups on file.  Signed:  Gerlene Hockey, MD           12/21/2023

## 2023-12-24 NOTE — Telephone Encounter (Signed)
 Continue current treatment.

## 2023-12-26 NOTE — Telephone Encounter (Signed)
 Called to follow up with patient  No answer  Left detailed message including Dr. Adrian response to episode and informed the patient there is no need to return call unless he has additional questions and the clinic number was provided

## 2024-01-04 ENCOUNTER — Encounter: Payer: Self-pay | Admitting: Sports Medicine

## 2024-01-04 ENCOUNTER — Ambulatory Visit: Admitting: Sports Medicine

## 2024-01-04 ENCOUNTER — Ambulatory Visit: Payer: Self-pay

## 2024-01-04 VITALS — BP 154/90 | HR 76 | Temp 97.4°F | Wt 154.0 lb

## 2024-01-04 DIAGNOSIS — I10 Essential (primary) hypertension: Secondary | ICD-10-CM

## 2024-01-04 DIAGNOSIS — I5020 Unspecified systolic (congestive) heart failure: Secondary | ICD-10-CM | POA: Diagnosis not present

## 2024-01-04 DIAGNOSIS — R0982 Postnasal drip: Secondary | ICD-10-CM

## 2024-01-04 LAB — BRAIN NATRIURETIC PEPTIDE: Pro B Natriuretic peptide (BNP): 4885 pg/mL — ABNORMAL HIGH (ref 0.0–100.0)

## 2024-01-04 MED ORDER — FLUTICASONE PROPIONATE 50 MCG/ACT NA SUSP: 2.0000 | Freq: Every day | NASAL | 6 refills | Status: AC

## 2024-01-04 MED ORDER — LORATADINE 10 MG PO TABS: 10.0000 mg | ORAL_TABLET | Freq: Every day | ORAL | 11 refills | Status: AC

## 2024-01-04 NOTE — Patient Instructions (Signed)
 Take lasix  20 mg 2 tab in the morning and 1 tab 20 mg in the evening for 5 days  Then go back to 20 mg twice daily

## 2024-01-04 NOTE — Progress Notes (Signed)
 Careteam: Patient Care Team: Candise Aleene DEL, MD as PCP - General (Family Medicine) Lavona Agent, MD as PCP - Cardiology (Cardiology) Waddell Danelle ORN, MD as PCP - Electrophysiology (Cardiology) Micky Reyes BIRCH, MD as Consulting Physician (Cardiology) Beola Arvella CROME, MD as Consulting Physician (Urology) Renda Glance, MD as Consulting Physician (Urology) Kerrin Elspeth BROCKS, MD as Consulting Physician (Cardiothoracic Surgery) Burnard Debby LABOR, MD (Inactive) as Consulting Physician (Cardiology) Janene Boer, [place] as Consulting Physician (Cardiology) Lavona Agent, MD as Consulting Physician (Cardiology) Waddell Danelle ORN, MD as Consulting Physician (Cardiology) Sheree Penne Bruckner, MD as Consulting Physician (Vascular Surgery)  Allergies  Allergen Reactions   Entresto  [Sacubitril -Valsartan ] Hives and Shortness Of Breath   Amlodipine  Swelling    LE swelling   Contrast Media [Iodinated Contrast Media] Other (See Comments)    Prostate problem had to wear a catheter for two weeks after having dye.    Hydralazine  Palpitations and Other (See Comments)    Fatigue+   Carvedilol      Bradycardia   Spironolactone      Hyperkalemia     No chief complaint on file.   Discussed the use of AI scribe software for clinical note transcription with the patient, who gave verbal consent to proceed.  History of Present Illness  Daniel [name]. is a 78 year old male with heart failure who presents with weight gain and concerns about fluid retention.  He has experienced a weight gain of four pounds over the past two weeks, with his weight increasing from 150 pounds on November 20th to 154 pounds today. He monitors his weight daily and noticed a gain of one pound yesterday and another today. He has a history of heart failure and follows with cardiology.  No recent changes in breathing from baseline, although he experiences shortness of breath after walking approximately 200 feet. He sleeps  with his head elevated using one pillow and reports no new or worsening swelling in his legs, which he manages with compression stockings.  No chest pain, palpitations, abdominal pain, nausea, vomiting, dizziness, or lightheadedness. He takes Lasix  20 mg twice daily and has not missed any doses. He mentions dietary indiscretions during the holidays .  He reports nasal congestion with post-nasal drip   but denies sinus pain, cough, sore throat, headaches, body aches, or runny nose.    He mentions a history of high blood pressure, which he monitors regularly.     Review of Systems:  Review of Systems  Constitutional:  Negative for chills and fever.  HENT:  Negative for congestion and sore throat.        Post nasal drip  Eyes:  Negative for blurred vision.  Respiratory:  Positive for shortness of breath. Negative for cough and sputum production.   Cardiovascular:  Positive for leg swelling. Negative for chest pain and palpitations.  Gastrointestinal:  Negative for abdominal pain, heartburn and nausea.  Genitourinary:  Negative for dysuria, frequency and hematuria.  Musculoskeletal:  Negative for falls and myalgias.  Neurological:  Negative for dizziness, sensory change and focal weakness.   Negative unless indicated in HPI.   Patient Active Problem List   Diagnosis Date Noted   Wheezing 10/25/2023   Acute on chronic combined systolic and diastolic CHF (congestive heart failure) (HCC) 10/25/2023   Elevated troponin 10/25/2023   Elevated brain natriuretic peptide (BNP) level 10/25/2023   Acute exacerbation of CHF (congestive heart failure) (HCC) 10/23/2023   Cerebral thrombosis with cerebral infarction 01/02/2020   ICD (implantable cardioverter-defibrillator)  in place 07/16/2019   Chronic systolic HF (heart failure) (HCC) 09/30/2018   Hx of CABG 06/27/2018   Coronary artery disease involving coronary bypass graft of native heart without angina pectoris 06/07/2017   Tobacco abuse  counseling 06/07/2017   Ischemic cardiomyopathy 10/13/2016   Non-ST elevation (NSTEMI) myocardial infarction [facility])    Prostate cancer (HCC) 04/30/2015   Hyperlipidemia LDL goal <70 10/30/2013   Tobacco dependence 03/01/2013   Benign prostatic hyperplasia with urinary obstruction 08/30/2012   GERD (gastroesophageal reflux disease) 08/30/2012   Tinnitus of both ears 08/30/2012   Palpable abdominal aorta 08/30/2012   Postoperative atrial fibrillation (HCC)    Hypertension    Anxiety    Past Medical History:  Diagnosis Date   Abdominal aortic aneurysm 01/2021   4 cm, infrarenal.  Also enlarged left common iliac artery--vascular eval 03/2021->rpt aortic u/s 06/01/22 stable Aorta 4.2 cm.  Bilat common iliac aneurisms and left common femoral aneurism-->f/u 1 yr   Acute prostatitis 06/19/2013   Anxiety    Atrial fibrillation (HCC)    Limited episode, emergency room, June, 2013, spontaneous conversion to sinus rhythm, was evaluated By Dr. Micky 2013- no further visits.  Post-op (CABG) a-fib, converted on amio but pt self d/c'd this med due to DOE/side effect profile.   Blurred vision 01/01/2020   Bradycardia 06/20/2017   CAD (coronary artery disease) 05/2016   a. 05/2016: NSTEMI with cath showing 3-vessel disease. Underwent CABG on 05/12/16 with LIMA-D1, SVG-LAD, and SVG-PDA.    COPD (chronic obstructive pulmonary disease) (HCC) fall 2016   Bullous changes noted on lower lung images of CT abd done by urology   Diverticulitis    Double vision 01/2020   MG ab w/u NEG.  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 Unable to have MRI due to having shrapnel in forehead.   Hyperlipidemia     statin intolerant. Pt declined advanced lipid clinic referral 08/2019, 03/2020, and 01/2021.   Hypertension    Ischemic cardiomyopathy 05/2016   EF 20-25% at the time of NSTEMI/CABG.  Repeat EF 07/2016 30-35%.  07/2017 EF 25-30%, pt intol of entresto , bidil, and BB--for ICD 03/2019.   Microscopic hematuria Fall 2016   CT nl except nonobstructing stones.  Cystoscopy normal 12/30/14 (Dr. 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  Urol (Dr. Beola) at [facility]; Dr. Beola referred him to Dr. Renda 03/2015: patient got bilat nerve sparing , robot assisted laparoscopic radical prostatectomy and pelvic lymphadenectomy.  Urol f/u, PSA surveillance showing very mild uptrend of PSA but not to the level of biochemical recurrence as of 05/2021 urology follow-up   RLS (restless legs syndrome)    Statin intolerance    Tobacco dependence    down to 1-2 cigs per day as of 11/2015.  Restarted 08/2016.   Urinary retention 05/2015   occurred s/p foley removal   UTI (urinary tract infection) 11/14/2014   Past Surgical History:  Procedure Laterality  Date   aortic ultrasound  07/2012   2014 NO AAA.  01/2021-->4 cm AAA with R common iliac ectasia-->vasc surg ref   COLONOSCOPY  approx 2011 per pt   Done by surgeon in [place] after a bout of diverticulitis; normal per pt report--was told to repeat in 10 yrs.   CORONARY ARTERY BYPASS GRAFT N/A 05/12/2016   Procedure: CORONARY ARTERY BYPASS GRAFTING (CABG) TIMES THREE USING LEFT INTERNAL MAMMARY ARTERY AND RIGHT SAPHENOUS LEG VEIN HARVESTED ENDOSCOPICALLY;  Surgeon: Elspeth JAYSON Millers, MD;  Location: [facility] OR;  Service: Open Heart Surgery;  Laterality: N/A;   ICD IMPLANT N/A 04/12/2019   Procedure: ICD IMPLANT;  Surgeon: Waddell Danelle ORN, MD;  Location: [facility]  INVASIVE CV LAB;  Service: Cardiovascular;  Laterality: N/A;   INGUINAL HERNIA REPAIR  2003 and 2004   Both sides.   LAPAROSCOPY  2017   LEFT HEART CATH AND CORONARY ANGIOGRAPHY N/A 05/09/2016   Procedure: Left Heart Cath and Coronary Angiography;  Surgeon: Debby DELENA Sor, MD;  Location: [facility] INVASIVE CV LAB;  Service: Cardiovascular;  Laterality: N/A;   LITHOTRIPSY  2004   Dr. Verdene in [place].   LYMPHADENECTOMY Bilateral 04/30/2015   Procedure: PELVIC LYMPHADENECTOMY;  Surgeon: Gretel Ferrara, MD;  Location: [facility] ORS;  Service: Urology;  Laterality: Bilateral;   PROSTATE BIOPSY  02/05/2015   Prostate adenocarcinoma: pt considering treatment options as of 02/19/15   ROBOT ASSISTED LAPAROSCOPIC RADICAL PROSTATECTOMY N/A 04/30/2015   Procedure: XI ROBOTIC ASSISTED LAPAROSCOPIC RADICAL PROSTATECTOMY LEVEL 2;  Surgeon: Gretel Ferrara, MD;  Location: [facility] ORS;  Service: Urology;  Laterality: N/A;   TEE WITHOUT CARDIOVERSION N/A 05/12/2016   Procedure: TRANSESOPHAGEAL ECHOCARDIOGRAM (TEE);  Surgeon: Elspeth JAYSON Millers, MD;  Location: [facility] OR;  Service: Open Heart Surgery;  Laterality: N/A;   TRANSTHORACIC ECHOCARDIOGRAM  03/22/2006; 05/2016; 07/2016; 07/2017; 01/02/20   2008: EF 65%, normal valves, no wall motion abnormalties, LA size normal.  05/2016 in context of non-STEMI, EF 20-25%, mild AV and MV regurg.  08/05/16: EF 30-35%, akinesis of mid lateral myoc, grd II DD, mild aortic 07/2017: EF 25-30%, mult areas of akinesis, grd I DD. 01/2020 EF 25-30%, sev LV dsfxn, valves fine, aortic root 40mm   Social History   Tobacco Use   Smoking status: Some Days    Current packs/day: 1.00    Average packs/day: 1 pack/day for 55.0 years (55.0 ttl pk-yrs)    Types: Cigarettes   Smokeless tobacco: Never   Tobacco comments:    quit in 2017  Vaping Use   Vaping status: Never Used  Substance Use Topics   Alcohol use: No   Drug use: No   Family History  Problem Relation Age of Onset   Hypertension Mother    Cancer  - Other Mother 79       breast   Cancer Father        Lung ca age 68   Diabetes Sister    Allergies  Allergen Reactions   Entresto  [Sacubitril -Valsartan ] Hives and Shortness Of Breath   Amlodipine  Swelling    LE swelling   Contrast Media [Iodinated Contrast Media] Other (See Comments)    Prostate problem had to wear a catheter for two weeks after having dye.    Hydralazine  Palpitations and Other (See Comments)    Fatigue+   Carvedilol      Bradycardia   Spironolactone      Hyperkalemia     Medications: Patient's Medications  New Prescriptions   No medications on file  Previous  Medications   ALBUTEROL  (VENTOLIN  HFA) 108 (90 BASE) MCG/ACT INHALER    Inhale 1-2 puffs into the lungs every 6 (six) hours as needed for wheezing or shortness of breath.   ALPRAZOLAM  (XANAX ) 0.25 MG TABLET    tAKE 1 to 2 tabLETS BY MOUTH AT BEDTIME AS NEEDED FOR insomnia/restless legs   ASPIRIN  EC 81 MG TABLET    Take 1 tablet (81 mg total) by mouth daily. Swallow whole.   BENAZEPRIL  (LOTENSIN ) 20 MG TABLET    Take 0.5 tablets (10 mg total) by mouth in the morning and at bedtime. To keep BP Levels at 140/80. If levels are lower than this, patient only takes one-half tablet   BISOPROLOL  FUMARATE 2.5 MG TABS    Take 2.5 mg by mouth daily.   DAPAGLIFLOZIN  PROPANEDIOL (FARXIGA ) 10 MG TABS TABLET    Take 10 mg by mouth daily.   FUROSEMIDE  (LASIX ) 20 MG TABLET    Take 1 tablet (20 mg total) by mouth daily.   MONTELUKAST (SINGULAIR) 10 MG TABLET    Take 10 mg by mouth daily.   OMEPRAZOLE (PRILOSEC) 20 MG CAPSULE    Take 20 mg by mouth daily as needed (for acid reflux).  Modified Medications   No medications on file  Discontinued Medications   No medications on file    Physical Exam: There were no vitals filed for this visit. There is no height or weight on file to calculate BMI. BP Readings from Last 3 Encounters:  12/21/23 131/80  12/19/23 (!) 116/92  11/23/23 (!) 162/70   Wt Readings from Last 3  Encounters:  12/21/23 150 lb (68 kg)  12/19/23 152 lb (68.9 kg)  11/23/23 144 lb 9.6 oz (65.6 kg)    Physical Exam Constitutional:      Appearance: Normal appearance.  HENT:     Head: Normocephalic and atraumatic.  Cardiovascular:     Rate and Rhythm: Normal rate and regular rhythm.     Pulses: Normal pulses.     Heart sounds: Normal heart sounds.  Pulmonary:     Effort: No respiratory distress.     Breath sounds: No stridor. No wheezing or rales.  Abdominal:     General: Bowel sounds are normal. There is no distension.     Palpations: Abdomen is soft.     Tenderness: There is no abdominal tenderness. There is no guarding.  Musculoskeletal:        General: Swelling present.     Comments: At baseline  Rt lower extremity swelling +1  Neurological:     Mental Status: He is alert. Mental status is at baseline.     Sensory: No sensory deficit.     Motor: No weakness.     Labs reviewed: Basic Metabolic Panel: Recent Labs    10/25/23 0439 11/03/23 1104 11/14/23 1450 11/22/23 1407 11/30/23 1013  NA 139   < > 134* 136 139  K 4.3   < > 4.6 5.2* 3.8  CL 103   < > 96* 101 101  CO2 27   < > 26 27 30   GLUCOSE 105*   < > 96 100* 124*  BUN 28*   < > 22 22 19   CREATININE 0.91   < > 1.67* 1.15 1.01  CALCIUM  9.2   < > 9.1 9.5 9.3  PHOS 3.5  --   --   --   --   TSH  --   --   --  1.090  --    < > =  values in this interval not displayed.   Liver Function Tests: Recent Labs    05/24/23 1100 09/06/23 1134 10/23/23 0745 10/25/23 0439  AST 20 23 28   --   ALT 14 15 20   --   ALKPHOS 81 76 65  --   BILITOT 1.2 1.4* 1.6*  --   PROT 6.5 6.4 6.4*  --   ALBUMIN  4.2 4.2 3.7 3.4*   No results for input(s): LIPASE, AMYLASE in the last 8760 hours. No results for input(s): AMMONIA in the last 8760 hours. CBC: Recent Labs    05/24/23 1100 09/06/23 1134 10/23/23 0745  WBC 6.1 5.2 4.8  NEUTROABS 4.6 3.8  --   HGB 16.2 17.0 16.5  HCT 48.4 51.7 49.5  MCV 98.6 97.0 96.5   PLT 160.0 124.0* 131*   Lipid Panel: Recent Labs    05/24/23 1100  CHOL 156  HDL 38.40*  LDLCALC 106*  TRIG 61.0  CHOLHDL 4   TSH: Recent Labs    11/22/23 1407  TSH 1.090   A1C: Lab Results  Component Value Date   HGBA1C 5.2 01/02/2020    Assessment & Plan HFrEF (heart failure with reduced ejection fraction) (HCC) Lungs clear to aus O2 sat 98% Pt reports increased weight gain since 1 week Pt able to speak in full sentences Does not appear to be in distress Will increase take extra 20 mg lasix  in the morning for 5 days and then resume previous dosage Avoid salty foods Orders:   Basic metabolic panel   B Nat Peptide  Primary hypertension Bp high  Avoid salty foods Monitor bp keep a log    Post-nasal drip Take flonase , claritin 

## 2024-01-04 NOTE — Assessment & Plan Note (Signed)
 Daniel [name]

## 2024-01-04 NOTE — Telephone Encounter (Signed)
 FYI Only or Action Required?: FYI only for provider: appointment scheduled on 12.4.25.  Patient was last seen in primary care on 12/21/2023 by McGowen, Aleene DEL, MD.  Called Nurse Triage reporting Shortness of Breath.  Symptoms began yesterday.  Interventions attempted: Prescription medications: lasix , farxiga .  Symptoms are: gradually worsening.  Triage Disposition: See HCP Within 4 Hours (Or PCP Triage)  Patient/caregiver understands and will follow disposition?: Yes    Copied from CRM #[id number]. Topic: Clinical - Red Word Triage >> Jan 04, 2024  9:01 AM Thersia BROCKS wrote: [facility] that prompted transfer to Nurse Triage: Patient called in stated he believes he has fluid coming back stated he is having alil bit of trouble of breathing , but wants to get something for it before it gets to bad Reason for Disposition  [1] Longstanding difficulty breathing (e.g., CHF, COPD, emphysema) AND [2] WORSE than normal  Answer Assessment - Initial Assessment Questions Wt gain 2lbs in 2 days. States he has CHF and is trying to catch it before it gets any worse this time. Does having some tightness in his chest with deep breaths, but denies any chest pain. States he is a little short of breath. Feels like last episode where he had increased fluid. He takes farxiga  daily and lasix  20mg  BID. He is wearing compression stockings and it goes down at night. He is scheduled for the 30th to get his pacemaker replaced.    1. RESPIRATORY STATUS: Describe your breathing? (e.g., wheezing, shortness of breath, unable to speak, severe coughing)      Shortness of breath 2. ONSET: When did this breathing problem begin?      Yesterday and today 3. SEVERITY: How bad is your breathing? (e.g., mild, moderate, severe)      mild 4. RECURRENT SYMPTOM: Have you had difficulty breathing before? If Yes, ask: When was the last time? and What happened that time?      Yes, fluid build up needed increased lasix  for a  few days. 5. CARDIAC HISTORY: Do you have any history of heart disease? (e.g., heart attack, angina, bypass surgery, angioplasty)      CHF, cardiomyopathy 7. LUNG HISTORY: Do you have any history of lung disease?  (e.g., pulmonary embolus, asthma, emphysema)     no 8. CAUSE: What do you think is causing the breathing problem?      Increased fluid 9. OTHER SYMPTOMS: Do you have any other symptoms? (e.g., chest pain, cough, dizziness, fever, runny nose)     Denies, states he does have a slight cough at night where he has nasal drainage 10. O2 SATURATION MONITOR:  Do you use an oxygen saturation monitor (pulse oximeter) at home? If Yes, ask: What is your reading (oxygen level) today? What is your usual oxygen saturation reading? (e.g., 95%)       97-98  Protocols used: Breathing Difficulty-A-AH

## 2024-01-05 ENCOUNTER — Ambulatory Visit: Payer: Self-pay | Admitting: Sports Medicine

## 2024-01-05 ENCOUNTER — Ambulatory Visit: Payer: Medicare Other

## 2024-01-05 DIAGNOSIS — I5022 Chronic systolic (congestive) heart failure: Secondary | ICD-10-CM

## 2024-01-05 LAB — BASIC METABOLIC PANEL WITH GFR
BUN: 25 mg/dL (ref 7–25)
CO2: 29 mmol/L (ref 20–32)
Calcium: 9.6 mg/dL (ref 8.6–10.3)
Chloride: 101 mmol/L (ref 98–110)
Creat: 1.11 mg/dL (ref 0.70–1.28)
Glucose, Bld: 110 mg/dL — ABNORMAL HIGH (ref 65–99)
Potassium: 4.3 mmol/L (ref 3.5–5.3)
Sodium: 139 mmol/L (ref 135–146)
eGFR: 68 mL/min/1.73m2 (ref >=60)

## 2024-01-07 ENCOUNTER — Ambulatory Visit: Payer: Self-pay | Admitting: Internal Medicine

## 2024-01-07 LAB — CUP PACEART REMOTE DEVICE CHECK
Battery Remaining Longevity: 37 mo
Battery Remaining Percentage: 39 %
Battery Voltage: 2.93 V
Brady Statistic AP VP Percent: 74 %
Brady Statistic AP VS Percent: 13 %
Brady Statistic AS VP Percent: 4.7 %
Brady Statistic AS VS Percent: 6.4 %
Brady Statistic RA Percent Paced: 86 %
Brady Statistic RV Percent Paced: 79 %
Date Time Interrogation Session: 20251205020127
HighPow Impedance: 51 Ohm
Implantable Lead Connection Status: 753985
Implantable Lead Connection Status: 753985
Implantable Lead Implant Date: 20210312
Implantable Lead Implant Date: 20210312
Implantable Lead Location: 753859
Implantable Lead Location: 753860
Implantable Pulse Generator Implant Date: 20210312
Lead Channel Impedance Value: 350 Ohm
Lead Channel Impedance Value: 410 Ohm
Lead Channel Pacing Threshold Amplitude: 0.5 V
Lead Channel Pacing Threshold Amplitude: 1 V
Lead Channel Pacing Threshold Pulse Width: 0.5 ms
Lead Channel Pacing Threshold Pulse Width: 0.5 ms
Lead Channel Sensing Intrinsic Amplitude: 1.9 mV
Lead Channel Sensing Intrinsic Amplitude: 12 mV
Lead Channel Setting Pacing Amplitude: 1.25 V
Lead Channel Setting Pacing Amplitude: 1.5 V
Lead Channel Setting Pacing Pulse Width: 0.5 ms
Lead Channel Setting Sensing Sensitivity: 0.5 mV
Pulse Gen Serial Number: 111016652
Zone Setting Status: 755011

## 2024-01-09 ENCOUNTER — Telehealth (HOSPITAL_COMMUNITY): Payer: Self-pay

## 2024-01-09 NOTE — Telephone Encounter (Signed)
 Per Corean Hutching, RN, Dr Waddell would like patient to hold ASA two days prior to procedure.   Attempted to reach patient to discuss upcoming procedure, no answer. Left VM for patient to return call.

## 2024-01-09 NOTE — Telephone Encounter (Addendum)
 Spoke with patient to discuss upcoming procedure.   Confirmed patient is scheduled for a Biventricular implantable cardioverter defibrillator on Tuesday, December 30 with Dr. Dr. Waddell. Instructed patient to arrive at the Main Entrance A at [facility]: [address] [place], [place] [address] and check in at Admitting at 5:30 AM.   Labs to be completed  Any recent signs of acute illness or been started on antibiotics? No  Any new medications started? No Any medications to hold? Yes.  HOLD Aspirin  for 2 days prior to your procedure. Last dose on December 27. Medication instructions:  On the morning of your procedure hold Furosemide  (Lasix ). Take all other medications not discussed with a sip of water . You do not have to stop Farxiga  prior to procedure. Patient declines the need for pre-meds for documented contrast allergy since prostate surgery and able to tolerate contrast with no reaction.   No eating or drinking after midnight prior to procedure.   The night before your procedure and the morning of your procedure, wash thoroughly with the CHG surgical soap from the neck down, paying special attention to the area where your procedure will be performed.  Plan to go home the same day, you will only stay overnight if medically necessary. You MUST have a responsible adult to drive you home and MUST be with you the first 24 hours after you arrive home.  Informed patient a nurse will call a day before the procedure to confirm arrival time and ensure instructions are followed.  Patient verbalized understanding to all instructions provided and agreed to proceed with procedure.

## 2024-01-09 NOTE — Progress Notes (Signed)
 Remote ICD Transmission

## 2024-01-10 ENCOUNTER — Other Ambulatory Visit (HOSPITAL_COMMUNITY)
Admission: RE | Admit: 2024-01-10 | Discharge: 2024-01-10 | Disposition: A | Discharge status: Home / Self Care | Source: Ambulatory Visit | Attending: Internal Medicine | Admitting: Internal Medicine

## 2024-01-10 DIAGNOSIS — I5022 Chronic systolic (congestive) heart failure: Secondary | ICD-10-CM | POA: Insufficient documentation

## 2024-01-10 LAB — BASIC METABOLIC PANEL WITH GFR
Anion gap: 14 (ref 5–15)
BUN: 21 mg/dL (ref 8–23)
CO2: 25 mmol/L (ref 22–32)
Calcium: 9.7 mg/dL (ref 8.9–10.3)
Chloride: 100 mmol/L (ref 98–111)
Creatinine, Ser: 1.1 mg/dL (ref 0.61–1.24)
GFR, Estimated: >60 mL/min (ref >60)
Glucose, Bld: 133 mg/dL — ABNORMAL HIGH (ref 70–99)
Potassium: 4.6 mmol/L (ref 3.5–5.1)
Sodium: 139 mmol/L (ref 135–145)

## 2024-01-10 LAB — CBC
HCT: 49.9 % (ref 39.0–52.0)
Hemoglobin: 16.3 g/dL (ref 13.0–17.0)
MCH: 32.3 pg (ref 26.0–34.0)
MCHC: 32.7 g/dL (ref 30.0–36.0)
MCV: 98.8 fL (ref 80.0–100.0)
Platelets: 151 K/uL (ref 150–400)
RBC: 5.05 MIL/uL (ref 4.22–5.81)
RDW: 15.4 % (ref 11.5–15.5)
WBC: 5.8 K/uL (ref 4.0–10.5)
nRBC: 0 % (ref 0.0–0.2)

## 2024-01-16 ENCOUNTER — Other Ambulatory Visit: Payer: Self-pay | Admitting: Family Medicine

## 2024-01-22 ENCOUNTER — Ambulatory Visit: Payer: Self-pay

## 2024-01-22 NOTE — Telephone Encounter (Signed)
 noted

## 2024-01-22 NOTE — Telephone Encounter (Signed)
 FYI Only or Action Required?: FYI only for provider: appointment scheduled on 12/23.  Patient was last seen in primary care on 01/04/2024 by Sherlynn Madden, MD.  Called Nurse Triage reporting Hypertension.  Symptoms began several days ago.  Interventions attempted: Prescription medications: benzapril, lasix , farxiga .  Symptoms are: gradually worsening.  Triage Disposition: See PCP Within 2 Weeks  Patient/caregiver understands and will follow disposition?: Yes   Message from [facility] S sent at 01/22/2024  9:43 AM EST  Reason for Triage: retaining fluid, take Lasix  2x a day, bp at night is normal but the bottom number goes high then levels back out. He would like a call back with recommendations   Reason for Disposition  [1] Systolic BP >= 130 OR Diastolic >= 80 AND [2] taking BP medications  Answer Assessment - Initial Assessment Questions Patient with faulty valve that is set to be fixed next week. HF on lasix  BID and advised to take TID by dr sherlynn for 5 days.   BP in the mornings has been elevated. His breathing is faster and can tell it will be high on the monitor. His peripheral swelling has gotten better. BP this morning 146/77 and then 165/100. He has not missed any medications. Takes Benzapril, lasix , and farxiga .   Denies CP, Dizziness, SOB, Fever, headache, weakness.  Appt with PCP in AM to ensure right control of BP so his procedure wont be affected next week. ED precautions understood.   1. BLOOD PRESSURE: What is your blood pressure? Did you take at least two measurements 5 minutes apart?     146/77, 165/100 2. ONSET: When did you take your blood pressure?     Last few days  3. HOW: How did you take your blood pressure? (e.g., automatic home BP monitor, visiting nurse)     Arm cuff 4. HISTORY: Do you have a history of high blood pressure?     Hx of HTN  5. MEDICINES: Are you taking any medicines for blood pressure? Have you missed any doses  recently?     Benzapril, furosemide , Farxiga   6. OTHER SYMPTOMS: Do you have any symptoms? (e.g., blurred vision, chest pain, difficulty breathing, headache, weakness)     Breathing faster with  Protocols used: Blood Pressure - High-A-AH

## 2024-01-22 NOTE — Telephone Encounter (Signed)
 FYI  Please see below

## 2024-01-23 ENCOUNTER — Encounter: Payer: Self-pay | Admitting: Family Medicine

## 2024-01-23 ENCOUNTER — Ambulatory Visit: Admitting: Family Medicine

## 2024-01-23 VITALS — BP 149/84 | HR 81 | Temp 97.7°F | Ht 70.0 in | Wt 152.2 lb

## 2024-01-23 DIAGNOSIS — I5022 Chronic systolic (congestive) heart failure: Secondary | ICD-10-CM

## 2024-01-23 DIAGNOSIS — I10 Essential (primary) hypertension: Secondary | ICD-10-CM | POA: Diagnosis not present

## 2024-01-23 MED ORDER — METOPROLOL TARTRATE 25 MG PO TABS: 25.0000 mg | ORAL_TABLET | Freq: Two times a day (BID) | ORAL | 0 refills | Status: DC

## 2024-01-23 NOTE — Patient Instructions (Signed)
 Take metoprolol  25mg  tab twice per day.  Continue all other medications as is.

## 2024-01-23 NOTE — Progress Notes (Signed)
 OFFICE VISIT  01/23/2024  CC:  Chief Complaint  Patient presents with   Elevated blood pressure    Diastolic reading is elevated; average BP 137/84; took BP meds at least 2 hours ago    Patient is a 78 y.o. male who presents for elevated blood pressure.  HPI: For the last 3 to 4 days he has noted that his blood pressure in the morning is 160s over 100s.  Later in the morning and in the afternoon and evening it is more down around 130s over 80s.  He feels like his weight is stable over the last several days.  When he weighs at home without clothes he weighs 144 pounds today.  He says his typical weight is 142 to 143 pounds.  He takes 20 mg of Lasix  twice a day. He has swelling in his legs as the day goes on but when he wakes up in the morning it is gone. His dyspnea on exertion is stable---> approximately 200 feet on flat surface.  He reports a fluid intake of about 40 to 45 ounces per day. No chest pain, no dizziness, no palpitations.  For his hypertension he currently takes benazepril  20 mg twice a day. Back in October this year he was prescribed bisoprolol  2.5 mg daily by his cardiac provider.  Patient tells me today that he took it for several days and it caused him insomnia so he was then taken off of it.  It is still listed on his med list so I will take it off today.  Past Medical History:  Diagnosis Date   Abdominal aortic aneurysm 01/2021   4 cm, infrarenal.  Also enlarged left common iliac artery--vascular eval 03/2021->rpt aortic u/s 06/01/22 stable Aorta 4.2 cm.  Bilat common iliac aneurisms and left common femoral aneurism-->f/u 1 yr   Acute prostatitis 06/19/2013   Anxiety    Atrial fibrillation (HCC)    Limited episode, emergency room, June, 2013, spontaneous conversion to sinus rhythm, was evaluated By Dr. Micky 2013- no further visits.  Post-op (CABG) a-fib, converted on amio but pt self d/c'd this med due to DOE/side effect profile.   Blurred vision 01/01/2020    Bradycardia 06/20/2017   CAD (coronary artery disease) 05/2016   a. 05/2016: NSTEMI with cath showing 3-vessel disease. Underwent CABG on 05/12/16 with LIMA-D1, SVG-LAD, and SVG-PDA.    COPD (chronic obstructive pulmonary disease) (HCC) fall 2016   Bullous changes noted on lower lung images of CT abd done by urology   Diverticulitis    Double vision 01/2020   MG ab w/u NEG.  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 Unable to have MRI due to having shrapnel in forehead.   Hyperlipidemia    statin intolerant. Pt declined advanced lipid clinic referral 08/2019, 03/2020, and 01/2021.   Hypertension    Ischemic cardiomyopathy 05/2016   EF 20-25% at the time of NSTEMI/CABG.  Repeat EF 07/2016 30-35%.  07/2017 EF 25-30%, pt intol of entresto , bidil, and BB--for ICD 03/2019.   Microscopic hematuria Fall 2016   CT nl except nonobstructing stones.  Cystoscopy normal 12/30/14 (Dr. 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  Urol (Dr. Beola) at [facility]; Dr. Beola referred him to Dr. Renda 03/2015: patient got bilat nerve sparing , robot assisted laparoscopic radical prostatectomy and pelvic lymphadenectomy.  Urol f/u, PSA surveillance showing very mild uptrend of PSA but not to the level of biochemical recurrence as of 05/2021 urology follow-up   RLS (restless legs syndrome)    Statin intolerance    Tobacco dependence    down to 1-2 cigs per day as of 11/2015.  Restarted  08/2016.   Urinary retention 05/2015   occurred s/p foley removal   UTI (urinary tract infection) 11/14/2014    Past Surgical History:  Procedure Laterality Date   aortic ultrasound  07/2012   2014 NO AAA.  01/2021-->4 cm AAA with R common iliac ectasia-->vasc surg ref   COLONOSCOPY  approx 2011 per pt   Done by surgeon in [place] after a bout of diverticulitis; normal per pt report--was told to repeat in 10 yrs.   CORONARY ARTERY BYPASS GRAFT N/A 05/12/2016   Procedure: CORONARY ARTERY BYPASS GRAFTING (CABG) TIMES THREE USING LEFT INTERNAL MAMMARY ARTERY AND RIGHT SAPHENOUS LEG VEIN HARVESTED ENDOSCOPICALLY;  Surgeon: Elspeth JAYSON Millers, MD;  Location: [facility] OR;  Service: Open Heart Surgery;  Laterality: N/A;   ICD IMPLANT N/A 04/12/2019   Procedure: ICD IMPLANT;  Surgeon: Waddell Danelle ORN, MD;  Location: [facility] INVASIVE CV LAB;  Service: Cardiovascular;  Laterality: N/A;   INGUINAL HERNIA REPAIR  2003 and 2004   Both sides.   LAPAROSCOPY  2017   LEFT HEART CATH AND CORONARY ANGIOGRAPHY N/A 05/09/2016   Procedure: Left Heart Cath and Coronary Angiography;  Surgeon: Debby DELENA Sor, MD;  Location: [facility] INVASIVE CV LAB;  Service: Cardiovascular;  Laterality: N/A;   LITHOTRIPSY  2004   Dr. Verdene in [place].   LYMPHADENECTOMY Bilateral 04/30/2015   Procedure: PELVIC LYMPHADENECTOMY;  Surgeon: Gretel Renda, MD;  Location: [facility] ORS;  Service: Urology;  Laterality: Bilateral;   PROSTATE BIOPSY  02/05/2015   Prostate adenocarcinoma: pt considering treatment options as of 02/19/15   ROBOT ASSISTED LAPAROSCOPIC RADICAL PROSTATECTOMY N/A 04/30/2015   Procedure: XI ROBOTIC ASSISTED LAPAROSCOPIC RADICAL PROSTATECTOMY LEVEL 2;  Surgeon: Gretel Renda, MD;  Location: [facility] ORS;  Service: Urology;  Laterality: N/A;   TEE WITHOUT CARDIOVERSION N/A 05/12/2016   Procedure: TRANSESOPHAGEAL ECHOCARDIOGRAM (TEE);  Surgeon: Elspeth JAYSON Millers, MD;  Location: [facility] OR;  Service: Open Heart Surgery;  Laterality: N/A;    TRANSTHORACIC ECHOCARDIOGRAM  03/22/2006; 05/2016; 07/2016; 07/2017; 01/02/20   2008: EF 65%, normal valves, no wall motion abnormalties, LA size normal.  05/2016 in context of non-STEMI, EF 20-25%, mild AV and MV regurg.  08/05/16: EF 30-35%, akinesis of mid lateral myoc, grd II DD, mild aortic 07/2017: EF 25-30%, mult areas of akinesis, grd I DD. 01/2020 EF 25-30%, sev LV dsfxn, valves fine, no WMA.  10/2023 EF 20%, dec LV and RV fx, ele PA press, mod/sev MR    Outpatient Medications Prior to Visit  Medication Sig Dispense Refill   albuterol  (VENTOLIN  HFA) 108 (90 Base) MCG/ACT inhaler Inhale 1-2 puffs into the lungs every 6 (six) hours as needed for wheezing or shortness of breath. 8 g 1   ALPRAZolam  (XANAX ) 0.25 MG tablet tAKE 1 to 2 tabLETS BY MOUTH AT BEDTIME AS  NEEDED FOR insomnia/restless legs 60 tablet 1   aspirin  EC 81 MG tablet Take 1 tablet (81 mg total) by mouth daily. Swallow whole. 90 tablet 3   benazepril  (LOTENSIN ) 20 MG tablet TAKE ONE TABLET BY MOUTH TWICE DAILY 180 tablet 0   Bisoprolol  Fumarate 2.5 MG TABS Take 2.5 mg by mouth daily. 90 tablet 3   dapagliflozin  propanediol (FARXIGA ) 10 MG TABS tablet Take 10 mg by mouth daily.     furosemide  (LASIX ) 20 MG tablet Take 1 tablet (20 mg total) by mouth daily. (Patient taking differently: Take 20 mg by mouth 2 (two) times daily.) 90 tablet 3   loratadine  (CLARITIN ) 10 MG tablet Take 1 tablet (10 mg total) by mouth daily. (Patient taking differently: Take 10 mg by mouth daily as needed for allergies.) 30 tablet 11   omeprazole (PRILOSEC) 20 MG capsule Take 20 mg by mouth daily as needed (for acid reflux).     No facility-administered medications prior to visit.    Allergies[1]  Review of Systems  As per HPI  PE:    01/23/2024   11:21 AM 01/23/2024   11:17 AM 01/04/2024    1:29 PM  Vitals with BMI  Height  5' 10   Weight  152 lbs 3 oz   BMI  21.84   Systolic 149 143 845  Diastolic 84 83 90  Pulse  81      Physical  Exam  General: Alert and well-appearing. Affect is pleasant, thought and speech are lucid. Cardiovascular: Regular rhythm and rate with occasional ectopic beat.  Soft systolic murmur.  No diastolic murmur.  No rub. Lungs are clear bilaterally, breathing is nonlabored. Extremities show 3+ pitting edema on the right lower leg and 2+ pitting edema on the left lower leg.  LABS:  Last metabolic panel Lab Results  Component Value Date   GLUCOSE 133 (H) 01/10/2024   NA 139 01/10/2024   K 4.6 01/10/2024   CL 100 01/10/2024   CO2 25 01/10/2024   BUN 21 01/10/2024   CREATININE 1.10 01/10/2024   GFRNONAA >60 01/10/2024   CALCIUM  9.7 01/10/2024   PHOS 3.5 10/25/2023   PROT 6.4 (L) 10/23/2023   ALBUMIN  3.4 (L) 10/25/2023   LABGLOB 2.2 09/16/2016   AGRATIO 2.0 09/16/2016   BILITOT 1.6 (H) 10/23/2023   ALKPHOS 65 10/23/2023   AST 28 10/23/2023   ALT 20 10/23/2023   ANIONGAP 14 01/10/2024   proBNP on 01/04/2024 was 4885.  This was up from 2021 on 09/13/2023.  IMPRESSION AND PLAN:  #1 hypertension, uncontrolled.  Morning blood pressures in particular. Will try Lopressor  25 mg twice a day.  He will continue benazepril  20 mg twice daily. Continue home blood pressure monitoring and we will review his blood pressures in the office in 2 weeks.  #2 chronic systolic congestive heart failure. I believe he is euvolemic today. Continue Lasix  20 mg twice a day. He is scheduled for BIV ICD/CRT-D upgrade on 01/30/2024.  An After Visit Summary was printed and given to the patient.  FOLLOW UP: No follow-ups on file.  Signed:  Gerlene Hockey, MD           01/23/2024      [1]  Allergies Allergen Reactions   Entresto  [Sacubitril -Valsartan ] Hives and Shortness Of Breath   Amlodipine  Swelling    LE swelling   Contrast Media [Iodinated Contrast Media] Other (See Comments)    Prostate problem had to wear a catheter for two weeks after having dye.  Hydralazine  Palpitations and Other (See  Comments)    Fatigue+   Carvedilol      Bradycardia   Spironolactone      Hyperkalemia

## 2024-01-29 NOTE — Pre-Procedure Instructions (Signed)
 Instructed patient on the following items: Arrival time 0515 Nothing to eat or drink after midnight No meds AM of procedure Responsible person to drive you home and stay with you for 24 hrs Wash with special soap night before and morning of procedure If on anti-coagulant drug instructions ASA- last dose 12/27

## 2024-01-30 ENCOUNTER — Encounter: Payer: Self-pay | Admitting: Family Medicine

## 2024-01-30 ENCOUNTER — Ambulatory Visit (HOSPITAL_COMMUNITY)
Admission: RE | Disposition: A | Discharge status: Home / Self Care | Payer: Self-pay | Source: Home / Self Care | Attending: Internal Medicine

## 2024-01-30 ENCOUNTER — Ambulatory Visit (HOSPITAL_COMMUNITY)
Admission: RE | Admit: 2024-01-30 | Discharge: 2024-01-30 | Disposition: A | Discharge status: Home / Self Care | Attending: Internal Medicine | Admitting: Internal Medicine

## 2024-01-30 ENCOUNTER — Other Ambulatory Visit: Payer: Self-pay

## 2024-01-30 ENCOUNTER — Ambulatory Visit (HOSPITAL_COMMUNITY)

## 2024-01-30 DIAGNOSIS — I447 Left bundle-branch block, unspecified: Secondary | ICD-10-CM | POA: Diagnosis not present

## 2024-01-30 DIAGNOSIS — Z951 Presence of aortocoronary bypass graft: Secondary | ICD-10-CM | POA: Insufficient documentation

## 2024-01-30 DIAGNOSIS — Z4502 Encounter for adjustment and management of automatic implantable cardiac defibrillator: Secondary | ICD-10-CM | POA: Diagnosis not present

## 2024-01-30 DIAGNOSIS — I493 Ventricular premature depolarization: Secondary | ICD-10-CM | POA: Insufficient documentation

## 2024-01-30 DIAGNOSIS — I5022 Chronic systolic (congestive) heart failure: Secondary | ICD-10-CM | POA: Diagnosis not present

## 2024-01-30 DIAGNOSIS — F17210 Nicotine dependence, cigarettes, uncomplicated: Secondary | ICD-10-CM | POA: Insufficient documentation

## 2024-01-30 DIAGNOSIS — I110 Hypertensive heart disease with heart failure: Secondary | ICD-10-CM | POA: Insufficient documentation

## 2024-01-30 DIAGNOSIS — I252 Old myocardial infarction: Secondary | ICD-10-CM | POA: Diagnosis not present

## 2024-01-30 DIAGNOSIS — I5043 Acute on chronic combined systolic (congestive) and diastolic (congestive) heart failure: Secondary | ICD-10-CM

## 2024-01-30 DIAGNOSIS — I2510 Atherosclerotic heart disease of native coronary artery without angina pectoris: Secondary | ICD-10-CM | POA: Insufficient documentation

## 2024-01-30 HISTORY — PX: BIV ICD INSERTION CRT-D: EP1195

## 2024-01-30 SURGERY — BIV ICD INSERTION CRT-D

## 2024-01-30 MED ORDER — ONDANSETRON HCL 4 MG/2ML IJ SOLN: 4.0000 mg | Freq: Four times a day (QID) | INTRAMUSCULAR | Status: DC | PRN

## 2024-01-30 MED ORDER — SODIUM CHLORIDE 0.9 % IV SOLN
80.0000 mg | INTRAVENOUS | Status: AC
  Administered 2024-01-30: 80 mg

## 2024-01-30 MED ORDER — FENTANYL CITRATE (PF) 100 MCG/2ML IJ SOLN
INTRAMUSCULAR | Status: AC
  Filled 2024-01-30: qty 2

## 2024-01-30 MED ORDER — MIDAZOLAM HCL 5 MG/5ML IJ SOLN
INTRAMUSCULAR | Status: DC | PRN
  Administered 2024-01-30: 1 mg via INTRAVENOUS

## 2024-01-30 MED ORDER — LIDOCAINE HCL (PF) 1 % IJ SOLN
INTRAMUSCULAR | Status: DC | PRN
  Administered 2024-01-30: 60 mL

## 2024-01-30 MED ORDER — IOHEXOL 350 MG/ML SOLN
INTRAVENOUS | Status: DC | PRN
  Administered 2024-01-30: 15 mL

## 2024-01-30 MED ORDER — CEFAZOLIN SODIUM-DEXTROSE 2-4 GM/100ML-% IV SOLN
2.0000 g | INTRAVENOUS | Status: AC
  Administered 2024-01-30: 2 g via INTRAVENOUS

## 2024-01-30 MED ORDER — CEFAZOLIN SODIUM-DEXTROSE 2-4 GM/100ML-% IV SOLN
INTRAVENOUS | Status: AC
  Filled 2024-01-30: qty 100

## 2024-01-30 MED ORDER — FENTANYL CITRATE (PF) 100 MCG/2ML IJ SOLN
INTRAMUSCULAR | Status: DC | PRN
  Administered 2024-01-30: 12.5 ug via INTRAVENOUS

## 2024-01-30 MED ORDER — MIDAZOLAM HCL 2 MG/2ML IJ SOLN
INTRAMUSCULAR | Status: AC
  Filled 2024-01-30: qty 2

## 2024-01-30 MED ORDER — HEPARIN (PORCINE) IN NACL 1000-0.9 UT/500ML-% IV SOLN
INTRAVENOUS | Status: DC | PRN
  Administered 2024-01-30: 500 mL

## 2024-01-30 MED ORDER — CEFAZOLIN SODIUM-DEXTROSE 1-4 GM/50ML-% IV SOLN
1.0000 g | Freq: Once | INTRAVENOUS | Status: AC
  Administered 2024-01-30: 1 g via INTRAVENOUS
  Filled 2024-01-30: qty 50

## 2024-01-30 MED ORDER — SODIUM CHLORIDE 0.9 % IV SOLN
INTRAVENOUS | Status: AC
  Filled 2024-01-30: qty 2

## 2024-01-30 MED ORDER — POVIDONE-IODINE 10 % EX SWAB
2.0000 | Freq: Once | CUTANEOUS | Status: AC
  Administered 2024-01-30: 2 via TOPICAL

## 2024-01-30 MED ORDER — CHLORHEXIDINE GLUCONATE 4 % EX SOLN
4.0000 | Freq: Once | CUTANEOUS | Status: DC
  Filled 2024-01-30: qty 60

## 2024-01-30 MED ORDER — ACETAMINOPHEN 325 MG PO TABS: 325.0000 mg | ORAL_TABLET | ORAL | Status: DC | PRN

## 2024-01-30 MED ORDER — SODIUM CHLORIDE 0.9 % IV SOLN: INTRAVENOUS | Status: DC

## 2024-01-30 SURGICAL SUPPLY — 16 items
BALLOON COR SINUS VENO 6FR 80 (BALLOONS) IMPLANT
CABLE SURGICAL S-101-97-12 (CABLE) ×1 IMPLANT
CATH CPS DIRECT 135 DS2C020 (CATHETERS) IMPLANT
CATH JOSEPH QUAD ALLRED 6F REP (CATHETERS) IMPLANT
GUIDEWIRE ANGLED .035X150CM (WIRE) IMPLANT
GUIDEWIRE VASC J-TIP .035X150 (WIRE) IMPLANT
ICD GALLANT HFCRTD CDHFA500Q (ICD Generator) IMPLANT
KIT ESSENTIALS PG (KITS) IMPLANT
LEAD QUARTET 1458Q-86CM (Lead) IMPLANT
PAD DEFIB RADIO PHYSIO CONN (PAD) ×1 IMPLANT
POUCH AIGIS-R ANTIBACT ICD LRG (Mesh General) IMPLANT
SHEATH 7FR PRELUDE SNAP 25 (SHEATH) IMPLANT
SHEATH 8FR PRELUDE SNAP 25 (SHEATH) IMPLANT
SLITTER AGILIS HISPRO (INSTRUMENTS) IMPLANT
TRAY PACEMAKER INSERTION (PACKS) ×1 IMPLANT
WIRE ACUITY WHISPER EDS 4648 (WIRE) IMPLANT

## 2024-01-30 NOTE — H&P (Signed)
 "         HPI [name] returns today for followup. He is a pleasant 78 yo man with a h/o CAD, s/p MI, Chronic systolic heart failure, EF 20%, who underwent ICD insertion 5 years ago. He had a lot of PVCs after his procedure which could be supressed by pacing at 75/min. He feels ok at rest but notes that he gets tired quickly when he walks. No ICD therapies and no palpitations. He is now pacing 70% in the RV. He had been on amiodarone  previously but is not now. He had been taken off coreg .  Allergies       Allergies  Allergen Reactions   Entresto  [Sacubitril -Valsartan ] Hives and Shortness Of Breath   Amlodipine  Swelling      LE swelling   Contrast Media [Iodinated Contrast Media] Other (See Comments)      Prostate problem had to wear a catheter for two weeks after having dye.    Hydralazine  Palpitations and Other (See Comments)      Fatigue+   Carvedilol         Bradycardia   Spironolactone         Hyperkalemia                 Current Outpatient Medications  Medication Sig Dispense Refill   albuterol  (VENTOLIN  HFA) 108 (90 Base) MCG/ACT inhaler Inhale 1-2 puffs into the lungs every 6 (six) hours as needed for wheezing or shortness of breath. 8 g 1   ALPRAZolam  (XANAX ) 0.25 MG tablet tAKE 1 to 2 tabLETS BY MOUTH AT BEDTIME AS NEEDED FOR insomnia/restless legs 60 tablet 1   aspirin  EC 81 MG tablet Take 1 tablet (81 mg total) by mouth daily. Swallow whole. 90 tablet 3   benazepril  (LOTENSIN ) 20 MG tablet Take 0.5 tablets (10 mg total) by mouth in the morning and at bedtime. To keep BP Levels at 140/80. If levels are lower than this, patient only takes one-half tablet       Bisoprolol  Fumarate 2.5 MG TABS Take 2.5 mg by mouth daily. 90 tablet 3   dapagliflozin  propanediol (FARXIGA ) 10 MG TABS tablet Take 10 mg by mouth daily.       furosemide  (LASIX ) 20 MG tablet Take 1 tablet (20 mg total) by mouth daily. 90 tablet 3   montelukast (SINGULAIR) 10 MG tablet Take 10 mg by mouth daily.        omeprazole (PRILOSEC) 20 MG capsule Take 20 mg by mouth daily as needed (for acid reflux).          No current facility-administered medications for this visit.              Past Medical History:  Diagnosis Date   Abdominal aortic aneurysm 01/2021    4 cm, infrarenal.  Also enlarged left common iliac artery--vascular eval 03/2021->rpt aortic u/s 06/01/22 stable Aorta 4.2 cm.  Bilat common iliac aneurisms and left common femoral aneurism-->f/u 1 yr   Acute prostatitis 06/19/2013   Anxiety     Atrial fibrillation (HCC)      Limited episode, emergency room, June, 2013, spontaneous conversion to sinus rhythm, was evaluated By Dr. Micky 2013- no further visits.  Post-op (CABG) a-fib, converted on amio but pt self d/c'd this med due to DOE/side effect profile.   Blurred vision 01/01/2020   Bradycardia 06/20/2017   CAD (coronary artery disease) 05/2016    a. 05/2016: NSTEMI with cath showing 3-vessel disease. Underwent CABG on 05/12/16 with LIMA-D1, SVG-LAD,  and SVG-PDA.    COPD (chronic obstructive pulmonary disease) (HCC) fall 2016    Bullous changes noted on lower lung images of CT abd done by urology   Diverticulitis     Double vision 01/2020    MG ab w/u NEG.  +4th nerve palsy/mid brain CVA, small vessels dz->DAPT   GERD (gastroesophageal reflux disease)     Has received pneumococcal vaccination     History of CVA (cerebrovascular accident) without residual deficits 01/2020    L midbrain, with mild L 4th CN palsy-->small vessel dz->DAPT x 3 wks then ASA alone (pt's compliance with ASA PRIOR to CVA was questionable). Unable to have MRI due to having shrapnel in forehead.   Hyperlipidemia      statin intolerant. Pt declined advanced lipid clinic referral 08/2019, 03/2020, and 01/2021.   Hypertension     Ischemic cardiomyopathy 05/2016    EF 20-25% at the time of NSTEMI/CABG.  Repeat EF 07/2016 30-35%.  07/2017 EF 25-30%, pt intol of entresto , bidil, and BB--for ICD 03/2019.   Microscopic  hematuria Fall 2016    CT nl except nonobstructing stones.  Cystoscopy normal 12/30/14 (Dr. Beola)   Nephrolithiasis      11 mm stone on R, 2 mm stone on L, ureters clear   Non-STEMI (non-ST elevated myocardial infarction) (HCC) 05/2016    with impaired LV function   Prostatic adenocarcinoma (HCC) 02/2015    No evidence of metastatic dz on CT pelv 02/2015.  Urol (Dr. Beola) at [facility]; Dr. Beola referred him to Dr. Renda 03/2015: patient got bilat nerve sparing , robot assisted laparoscopic radical prostatectomy and pelvic lymphadenectomy.  Urol f/u, PSA surveillance showing very mild uptrend of PSA but not to the level of biochemical recurrence as of 05/2021 urology follow-up   RLS (restless legs syndrome)     Statin intolerance     Tobacco dependence      down to 1-2 cigs per day as of 11/2015.  Restarted 08/2016.   Urinary retention 05/2015    occurred s/p foley removal   UTI (urinary tract infection) 11/14/2014          ROS:    All systems reviewed and negative except as noted in the HPI.          Past Surgical History:  Procedure Laterality Date   aortic ultrasound   07/2012    2014 NO AAA.  01/2021-->4 cm AAA with R common iliac ectasia-->vasc surg ref   COLONOSCOPY   approx 2011 per pt    Done by surgeon in [place] after a bout of diverticulitis; normal per pt report--was told to repeat in 10 yrs.   CORONARY ARTERY BYPASS GRAFT N/A 05/12/2016    Procedure: CORONARY ARTERY BYPASS GRAFTING (CABG) TIMES THREE USING LEFT INTERNAL MAMMARY ARTERY AND RIGHT SAPHENOUS LEG VEIN HARVESTED ENDOSCOPICALLY;  Surgeon: Elspeth JAYSON Millers, MD;  Location: [facility] OR;  Service: Open Heart Surgery;  Laterality: N/A;   ICD IMPLANT N/A 04/12/2019    Procedure: ICD IMPLANT;  Surgeon: Waddell Danelle ORN, MD;  Location: [facility] INVASIVE CV LAB;  Service: Cardiovascular;  Laterality: N/A;   INGUINAL HERNIA REPAIR   2003 and 2004    Both sides.   LAPAROSCOPY   2017   LEFT HEART CATH AND CORONARY ANGIOGRAPHY  N/A 05/09/2016    Procedure: Left Heart Cath and Coronary Angiography;  Surgeon: Debby DELENA Sor, MD;  Location: [facility] INVASIVE CV LAB;  Service: Cardiovascular;  Laterality: N/A;   LITHOTRIPSY  2004    Dr. Verdene in [place].   LYMPHADENECTOMY Bilateral 04/30/2015    Procedure: PELVIC LYMPHADENECTOMY;  Surgeon: Gretel Ferrara, MD;  Location: [facility] ORS;  Service: Urology;  Laterality: Bilateral;   PROSTATE BIOPSY   02/05/2015    Prostate adenocarcinoma: pt considering treatment options as of 02/19/15   ROBOT ASSISTED LAPAROSCOPIC RADICAL PROSTATECTOMY N/A 04/30/2015    Procedure: XI ROBOTIC ASSISTED LAPAROSCOPIC RADICAL PROSTATECTOMY LEVEL 2;  Surgeon: Gretel Ferrara, MD;  Location: [facility] ORS;  Service: Urology;  Laterality: N/A;   TEE WITHOUT CARDIOVERSION N/A 05/12/2016    Procedure: TRANSESOPHAGEAL ECHOCARDIOGRAM (TEE);  Surgeon: Elspeth JAYSON Millers, MD;  Location: [facility] OR;  Service: Open Heart Surgery;  Laterality: N/A;   TRANSTHORACIC ECHOCARDIOGRAM   03/22/2006; 05/2016; 07/2016; 07/2017; 01/02/20    2008: EF 65%, normal valves, no wall motion abnormalties, LA size normal.  05/2016 in context of non-STEMI, EF 20-25%, mild AV and MV regurg.  08/05/16: EF 30-35%, akinesis of mid lateral myoc, grd II DD, mild aortic 07/2017: EF 25-30%, mult areas of akinesis, grd I DD. 01/2020 EF 25-30%, sev LV dsfxn, valves fine, aortic root 40mm                 Family History  Problem Relation Age of Onset   Hypertension Mother     Cancer - Other Mother 30        breast   Cancer Father          Lung ca age 67   Diabetes Sister              Social History         Socioeconomic History   Marital status: Married      Spouse name: Not on file   Number of children: Not on file   Years of education: Not on file   Highest education level: Not on file  Occupational History   Not on file  Tobacco Use   Smoking status: Some Days      Current packs/day: 1.00      Average packs/day: 1 pack/day for 55.0 years (55.0  ttl pk-yrs)      Types: Cigarettes   Smokeless tobacco: Never   Tobacco comments:      quit in 2017  Vaping Use   Vaping status: Never Used  Substance and Sexual Activity   Alcohol use: No   Drug use: No   Sexual activity: Not on file  Other Topics Concern   Not on file  Social History Narrative    Married, 2 grown children.HS education. Orig from [place], lives there now.Occupation: maintenance.  Drives a tractor a lot, drives a pickup truck a lot, rides horses a lot.Tobacco: 20 pack-yr hx (current as of 07/2012)No drugs.Alcohol: none    Social Drivers of Acupuncturist Strain: Low Risk  (09/29/2021)    Overall Financial Resource Strain (CARDIA)     Difficulty of Paying Living Expenses: Not hard at all  Food Insecurity: No Food Insecurity (10/23/2023)    Hunger Vital Sign     Worried About Running Out of Food in the Last Year: Never true     Ran Out of Food in the Last Year: Never true  Transportation Needs: No Transportation Needs (10/23/2023)    PRAPARE - Therapist, Art (Medical): No     Lack of Transportation (Non-Medical): No  Physical Activity: Inactive (09/29/2021)    Exercise Vital  Sign     Days of Exercise per Week: 0 days     Minutes of Exercise per Session: 0 min  Stress: No Stress Concern Present (09/29/2021)    [employer] of Occupational Health - Occupational Stress Questionnaire     Feeling of Stress : Not at all  Social Connections: Socially Integrated (10/23/2023)    Social Connection and Isolation Panel     Frequency of Communication with Friends and Family: More than three times a week     Frequency of Social Gatherings with Friends and Family: Twice a week     Attends Religious Services: More than 4 times per year     Active Member of [employer] or Organizations: Yes     Attends Engineer, Structural: More than 4 times per year     Marital Status: Married  Catering Manager Violence: Not At Risk  (10/23/2023)    Humiliation, Afraid, Rape, and Kick questionnaire     Fear of Current or Ex-Partner: No     Emotionally Abused: No     Physically Abused: No     Sexually Abused: No        BP (!) 116/92   Pulse 82   Ht 5' 10 (1.778 m)   Wt 152 lb (68.9 kg)   SpO2 94%   BMI 21.81 kg/m    Physical Exam:   Well appearing NAD HEENT: Unremarkable Neck:  No JVD, no thyromegally Lymphatics:  No adenopathy Back:  No CVA tenderness Lungs:  Clear HEART:  Regular rate rhythm, no murmurs, no rubs, no clicks Abd:  soft, positive bowel sounds, no organomegally, no rebound, no guarding Ext:  2 plus pulses, no edema, no cyanosis, no clubbing Skin:  No rashes no nodules Neuro:  CN II through XII intact, motor grossly intact     DEVICE  Normal device function.  See PaceArt for details.    Assess/Plan:   Chronic systolic heart faiure - his symptoms are worse. He has RV pacing induced LBBB and his EF is 20%. He is now pacing about 70% in the RV. He will continue GDMT. I will have him come in for attempted CRT upgrade. 2. CAD - he denies anginal symptoms. He is s/p CABG. 3. PVC's - these are improved.  4. ICD - his St. Jude DDD ICD is working normally. 3 years of battery longevity.     Danelle Solei Wubben,MD "

## 2024-01-30 NOTE — Discharge Instructions (Signed)
 After Your ICD (Implantable Cardiac Defibrillator)   You have a Abbott ICD  If you have a Medtronic or Biotronik device, plug in your home monitor once you get home, and no manual interaction is required.   If you have an Abbott or [employer] device, plug your home monitor once you get home, sit near the device, and press the large activation button. Sit nearby until the process is complete, usually notated by lights on the monitor.   If you were set up for monitoring using an app on your phone, make sure the app remains open in the background and the Bluetooth remains on.  ACTIVITY Do not lift your arm above shoulder height for 1 week after your procedure. After 7 days, you may progress as below.  You should remove your sling 24 hours after your procedure, unless otherwise instructed by your provider.     Tuesday February 06, 2024  Wednesday February 07, 2024 Thursday February 08, 2024 Friday February 09, 2024   Do not lift, push, pull, or carry anything over 10 pounds with the affected arm until 6 weeks (Tuesday March 12, 2024 ) after your procedure.   You may drive AFTER your wound check, UNLESS you have been told otherwise by your provider.   Ask your healthcare provider when you can go back to work   INCISION/Dressing If you are on a blood thinner such as Coumadin, Xarelto, Eliquis, Plavix , or Pradaxa please confirm with your provider when this should be resumed.   If large square, outer bandage is left in place, this can be removed after 24 hours from your procedure. Do not remove steri-strips or glue as below.   Monitor your defibrillator site for redness, swelling, and drainage. Call the device clinic at [phone] if you experience these symptoms or fever/chills.    If your incision is sealed with Steri-strips or staples, you may shower 7 days after your procedure or when told by your provider. Do not remove the steri-strips or let the shower hit directly on your site.  You may wash around your site with soap and water .    If you were discharged in a sling, please do not wear this during the day more than 48 hours after your surgery unless otherwise instructed. This may increase the risk of stiffness and soreness in your shoulder.   Avoid lotions, ointments, or perfumes over your incision until it is well-healed.  You may use a hot tub or a pool AFTER your wound check appointment if the incision is completely closed.  Your ICD is designed to protect you from life threatening heart rhythms. Because of this, you may receive a shock.   1 shock with no symptoms:  Call the office during business hours. 1 shock with symptoms (chest pain, chest pressure, dizziness, lightheadedness, shortness of breath, overall feeling unwell):  Call 911. If you experience 2 or more shocks in 24 hours:  Call 911. If you receive a shock, you should not drive for 6 months per the [place] DMV IF you receive appropriate therapy from your ICD.   ICD Alerts:  Some alerts are vibratory and others beep. These are NOT emergencies. Please call our office to let us  know. If this occurs at night or on weekends, it can wait until the next business day. Send a remote transmission.  If your device is capable of reading fluid status (for heart failure), you will be offered monthly monitoring to review this with you.   DEVICE MANAGEMENT  Remote monitoring is used to monitor your ICD from home. This monitoring is scheduled every 91 days by our office. It allows us  to keep an eye on the functioning of your device to ensure it is working properly. You will routinely see your Electrophysiologist annually (more often if necessary). This will appear as a REMOTE check on your MyChart schedule. These are automatic and there is nothing for you to manually do unless otherwise instructed.  You should receive your ID card for your new device in 4-8 weeks. Keep this card with you at all times once received. Consider  wearing a medical alert bracelet or necklace.  Your ICD  may be MRI compatible. This will be discussed at your next office visit/wound check.  You should avoid contact with strong electric or magnetic fields.   Do not use amateur (ham) radio equipment or electric (arc) welding torches. MP3 player headphones with magnets should not be used. Some devices are safe to use if held at least 12 inches (30 cm) from your defibrillator. These include power tools, lawn mowers, and speakers. If you are unsure if something is safe to use, ask your health care provider.  When using your cell phone, hold it to the ear that is on the opposite side from the defibrillator. Do not leave your cell phone in a pocket over the defibrillator.  You may safely use electric blankets, heating pads, computers, and microwave ovens.  Call the office right away if: You have chest pain. You feel more than one shock. You feel more short of breath than you have felt before. You feel more light-headed than you have felt before. Your incision starts to open up.  This information is not intended to replace advice given to you by your health care provider. Make sure you discuss any questions you have with your health care provider.

## 2024-01-31 ENCOUNTER — Encounter (HOSPITAL_COMMUNITY): Payer: Self-pay | Admitting: Internal Medicine

## 2024-01-31 MED FILL — Midazolam HCl Inj 2 MG/2ML (Base Equivalent): INTRAMUSCULAR | Qty: 1 | Status: AC

## 2024-02-07 ENCOUNTER — Encounter: Payer: Self-pay | Admitting: Family Medicine

## 2024-02-07 ENCOUNTER — Ambulatory Visit: Admitting: Family Medicine

## 2024-02-07 ENCOUNTER — Ambulatory Visit (HOSPITAL_BASED_OUTPATIENT_CLINIC_OR_DEPARTMENT_OTHER)
Admission: RE | Admit: 2024-02-07 | Discharge: 2024-02-07 | Disposition: A | Discharge status: Home / Self Care | Source: Ambulatory Visit | Attending: Family Medicine | Admitting: Family Medicine

## 2024-02-07 VITALS — BP 148/74 | HR 66 | Temp 97.3°F | Wt 158.0 lb

## 2024-02-07 DIAGNOSIS — R600 Localized edema: Secondary | ICD-10-CM

## 2024-02-07 DIAGNOSIS — R0609 Other forms of dyspnea: Secondary | ICD-10-CM

## 2024-02-07 DIAGNOSIS — I5022 Chronic systolic (congestive) heart failure: Secondary | ICD-10-CM

## 2024-02-07 DIAGNOSIS — I10 Essential (primary) hypertension: Secondary | ICD-10-CM

## 2024-02-07 NOTE — Addendum Note (Signed)
 Addended by: FLETA CARE D on: 02/07/2024 03:31 PM   Modules accepted: Orders

## 2024-02-07 NOTE — Progress Notes (Signed)
 OFFICE VISIT  02/07/2024  CC:  Chief Complaint  Patient presents with   Medical Management of Chronic Issues    Patient is a 79 y.o. male who presents for 2-week follow-up uncontrolled hypertension. A/P as of last visit: #1 hypertension, uncontrolled.  Morning blood pressures in particular. Will try Lopressor  25 mg twice a day.  He will continue benazepril  20 mg twice daily. Continue home blood pressure monitoring and we will review his blood pressures in the office in 2 weeks.   #2 chronic systolic congestive heart failure. I believe he is euvolemic today. Continue Lasix  20 mg twice a day. He is scheduled for BIV ICD/CRT-D upgrade on 01/30/2024.  INTERIM HX: He got his CRT-D upgrade 1 week ago. He feels more dyspnea on exertion lately and increased lower extremity swelling.  Usually he can walk about 200 feet on flat surfaces without feeling short of breath but now he can only do about 25 feet.  No chest pain, palpitations, wheezing, cough, dizziness, or fever. Swelling goes down some when he props them up in bed at night but then throughout his day it progresses. No PND.  He chronically sleeps on a couple pillows to help with reflux.  His usual weight is 144 to 146 pounds at home.  He says for the last 4 to 5 days he has weighed 149 to 151 pounds.  The last 3 days he has taken 1 extra Lasix  tab per day (his usual is 20 mg tab twice a day).  He is eating and drinking well.  He tries to limit his fluid intake to 45 ounces of clear liquids per day and tries to limit salt intake.  Of note, he tried taking the Lopressor  briefly but says it caused him insomnia just like the other beta-blocker did.  Past Medical History:  Diagnosis Date   Abdominal aortic aneurysm 01/2021   4 cm, infrarenal.  Also enlarged left common iliac artery--vascular eval 03/2021->rpt aortic u/s 06/01/22 stable Aorta 4.2 cm.  Bilat common iliac aneurisms and left common femoral aneurism-->f/u 1 yr   Acute  prostatitis 06/19/2013   Anxiety    Atrial fibrillation (HCC)    Limited episode, emergency room, June, 2013, spontaneous conversion to sinus rhythm, was evaluated By Dr. Micky 2013- no further visits.  Post-op (CABG) a-fib, converted on amio but pt self d/c'd this med due to DOE/side effect profile.   Blurred vision 01/01/2020   Bradycardia 06/20/2017   CAD (coronary artery disease) 05/2016   a. 05/2016: NSTEMI with cath showing 3-vessel disease. Underwent CABG on 05/12/16 with LIMA-D1, SVG-LAD, and SVG-PDA.    COPD (chronic obstructive pulmonary disease) (HCC) fall 2016   Bullous changes noted on lower lung images of CT abd done by urology   Diverticulitis    Double vision 01/2020   MG ab w/u NEG.  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 Unable to have MRI due to having shrapnel in forehead.   Hyperlipidemia    statin intolerant. Pt declined advanced lipid clinic referral 08/2019, 03/2020, and 01/2021.   Hypertension    Ischemic cardiomyopathy 05/2016   EF 20-25% at the time of NSTEMI/CABG.  Repeat EF 07/2016 30-35%.  07/2017  EF 25-30%, pt intol of entresto , bidil, and BB--for ICD 03/2019.   Microscopic hematuria Fall 2016   CT nl except nonobstructing stones.  Cystoscopy normal 12/30/14 (Dr. 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  Urol (Dr. Beola) at [facility]; Dr. Beola referred him to Dr. Renda 03/2015: patient got bilat nerve sparing , robot assisted  laparoscopic radical prostatectomy and pelvic lymphadenectomy.  Urol f/u, PSA surveillance showing very mild uptrend of PSA but not to the level of biochemical recurrence as of 05/2021 urology follow-up   RLS (restless legs syndrome)    Statin intolerance    Tobacco dependence    down to 1-2 cigs per day as of 11/2015.  Restarted 08/2016.   Urinary retention 05/2015   occurred s/p foley removal   UTI (urinary tract infection) 11/14/2014    Past Surgical History:  Procedure Laterality Date   aortic ultrasound  07/2012   2014 NO AAA.  01/2021-->4 cm AAA with R common iliac ectasia-->vasc surg ref   BIV ICD INSERTION CRT-D     01/30/24   BIV ICD INSERTION CRT-D N/A 01/30/2024   Procedure: BIV ICD INSERTION CRT-D;  Surgeon: Waddell Danelle ORN, MD;  Location: [facility] INVASIVE CV LAB;  Service: Cardiovascular;  Laterality: N/A;   COLONOSCOPY  approx 2011 per pt   Done by surgeon in [place] after a bout of diverticulitis; normal per pt report--was told to repeat in 10 yrs.   CORONARY ARTERY BYPASS GRAFT N/A 05/12/2016   Procedure: CORONARY ARTERY BYPASS GRAFTING (CABG) TIMES THREE USING LEFT INTERNAL MAMMARY ARTERY AND RIGHT SAPHENOUS LEG VEIN HARVESTED ENDOSCOPICALLY;  Surgeon: Elspeth JAYSON Millers, MD;  Location: [facility] OR;  Service: Open Heart Surgery;  Laterality: N/A;   ICD IMPLANT N/A 04/12/2019   Procedure: ICD IMPLANT;  Surgeon: Waddell Danelle ORN, MD;  Location: [facility] INVASIVE CV LAB;  Service: Cardiovascular;  Laterality: N/A;   INGUINAL HERNIA REPAIR  2003 and 2004   Both sides.   LAPAROSCOPY  2017   LEFT HEART CATH AND CORONARY ANGIOGRAPHY N/A 05/09/2016   Procedure: Left Heart Cath and Coronary Angiography;  Surgeon: Debby DELENA Sor, MD;  Location: [facility] INVASIVE CV LAB;  Service: Cardiovascular;  Laterality: N/A;   LITHOTRIPSY  2004   Dr. Verdene in [place].   LYMPHADENECTOMY Bilateral 04/30/2015   Procedure: PELVIC LYMPHADENECTOMY;  Surgeon: Gretel Renda, MD;  Location: [facility] ORS;  Service: Urology;   Laterality: Bilateral;   PROSTATE BIOPSY  02/05/2015   Prostate adenocarcinoma: pt considering treatment options as of 02/19/15   ROBOT ASSISTED LAPAROSCOPIC RADICAL PROSTATECTOMY N/A 04/30/2015   Procedure: XI ROBOTIC ASSISTED LAPAROSCOPIC RADICAL PROSTATECTOMY LEVEL 2;  Surgeon: Gretel Renda, MD;  Location: [facility] ORS;  Service: Urology;  Laterality: N/A;   TEE WITHOUT CARDIOVERSION N/A 05/12/2016   Procedure: TRANSESOPHAGEAL ECHOCARDIOGRAM (TEE);  Surgeon: Elspeth JAYSON Millers, MD;  Location: [facility] OR;  Service: Open Heart Surgery;  Laterality: N/A;   TRANSTHORACIC ECHOCARDIOGRAM  03/22/2006; 05/2016; 07/2016; 07/2017; 01/02/20   2008: EF 65%, normal valves, no wall motion abnormalties, LA size normal.  05/2016 in context of non-STEMI, EF 20-25%, mild AV and MV regurg.  08/05/16: EF 30-35%, akinesis of mid lateral myoc, grd II DD, mild aortic 07/2017: EF 25-30%, mult areas of akinesis, grd  I DD. 01/2020 EF 25-30%, sev LV dsfxn, valves fine, no WMA.  10/2023 EF 20%, dec LV and RV fx, ele PA press, mod/sev MR    Outpatient Medications Prior to Visit  Medication Sig Dispense Refill   albuterol  (VENTOLIN  HFA) 108 (90 Base) MCG/ACT inhaler Inhale 1-2 puffs into the lungs every 6 (six) hours as needed for wheezing or shortness of breath. 8 g 1   ALPRAZolam  (XANAX ) 0.25 MG tablet tAKE 1 to 2 tabLETS BY MOUTH AT BEDTIME AS NEEDED FOR insomnia/restless legs 60 tablet 1   aspirin  EC 81 MG tablet Take 1 tablet (81 mg total) by mouth daily. Swallow whole. 90 tablet 3   benazepril  (LOTENSIN ) 20 MG tablet TAKE ONE TABLET BY MOUTH TWICE DAILY 180 tablet 0   dapagliflozin  propanediol (FARXIGA ) 10 MG TABS tablet Take 10 mg by mouth daily.     furosemide  (LASIX ) 20 MG tablet Take 1 tablet (20 mg total) by mouth daily. (Patient taking differently: Take 20 mg by mouth 2 (two) times daily. Tid x 4 d) 90 tablet 3   loratadine  (CLARITIN ) 10 MG tablet Take 1 tablet (10 mg total) by mouth daily. 30 tablet 11   omeprazole (PRILOSEC) 20  MG capsule Take 20 mg by mouth daily as needed (for acid reflux).     metoprolol  tartrate (LOPRESSOR ) 25 MG tablet Take 1 tablet (25 mg total) by mouth 2 (two) times daily. (Patient not taking: Reported on 02/07/2024) 60 tablet 0   No facility-administered medications prior to visit.    Allergies[1]  Review of Systems As per HPI  PE:    02/07/2024    9:35 AM 02/07/2024    9:26 AM 01/30/2024    2:07 PM  Vitals with BMI  Weight  158 lbs   BMI  22.67   Systolic 148 152 860  Diastolic 74 81 96  Pulse  66    02 sat 98% RA today  Physical Exam  Gen: Alert, well appearing.  Patient is oriented to person, place, time, and situation. CV: Regular rhythm with occasional ectopy/pause, rate approximately 65-70.  No murmur. No rub. Lungs are clear to auscultation bilaterally but he has decreased breath sounds in the right posterior lung field, particularly in the base.  Breathing is nonlabored. Extremities 3+ bilateral lower extremity pitting edema is palpable through his socks and longjohns.  LABS:  Last metabolic panel Lab Results  Component Value Date   GLUCOSE 133 (H) 01/10/2024   NA 139 01/10/2024   K 4.6 01/10/2024   CL 100 01/10/2024   CO2 25 01/10/2024   BUN 21 01/10/2024   CREATININE 1.10 01/10/2024   GFRNONAA >60 01/10/2024   CALCIUM  9.7 01/10/2024   PHOS 3.5 10/25/2023   PROT 6.4 (L) 10/23/2023   ALBUMIN  3.4 (L) 10/25/2023   LABGLOB 2.2 09/16/2016   AGRATIO 2.0 09/16/2016   BILITOT 1.6 (H) 10/23/2023   ALKPHOS 65 10/23/2023   AST 28 10/23/2023   ALT 20 10/23/2023   ANIONGAP 14 01/10/2024   IMPRESSION AND PLAN:  #1 hypertension, not ideal control but it is reasonable for the time being, particularly since we will be increasing his diuretic.  Continue benazepril  20 mg twice daily. Monitor electrolytes and creatinine today.  2.  Chronic congestive heart failure. He is status post recent CRT-D upgrade. His symptoms are actually little bit worse over the last  week. Will check chest x-ray today as well as BNP and metabolic panel. For the next 2 days we will have  him take 60 mg of Lasix  twice a day, then decrease to 40 mg twice a day and I will see him back in 5 days.  Of note, he has follow-up with his electrophysiologist in 6 days and with his general cardiologist in 7 days.  If BNP normal and chest x-ray normal today then will add a D-dimer to today's labs and likely change the plans we have made for the increased diuretic dosing.  An After Visit Summary was printed and given to the patient.  FOLLOW UP: Return in about 5 days (around 02/12/2024) for f/u CHF.  Signed:  Gerlene Hockey, MD           02/07/2024     [1]  Allergies Allergen Reactions   Entresto  [Sacubitril -Valsartan ] Hives and Shortness Of Breath   Amlodipine  Swelling    LE swelling   Contrast Media [Iodinated Contrast Media] Other (See Comments)    Prostate problem had to wear a catheter for two weeks after having dye.    Hydralazine  Palpitations and Other (See Comments)    Fatigue+   Carvedilol      Bradycardia   Spironolactone      Hyperkalemia

## 2024-02-07 NOTE — Patient Instructions (Signed)
 Take 3 of the 20mg  tabs twice a day for 2 days and then decrease to 2 tabs twice a day.

## 2024-02-08 ENCOUNTER — Inpatient Hospital Stay (HOSPITAL_COMMUNITY)
Admission: EM | Admit: 2024-02-08 | Discharge: 2024-02-10 | DRG: 291 | Disposition: A | Attending: Internal Medicine | Admitting: Internal Medicine

## 2024-02-08 ENCOUNTER — Emergency Department (HOSPITAL_COMMUNITY)

## 2024-02-08 DIAGNOSIS — Z951 Presence of aortocoronary bypass graft: Secondary | ICD-10-CM | POA: Diagnosis not present

## 2024-02-08 DIAGNOSIS — Z8673 Personal history of transient ischemic attack (TIA), and cerebral infarction without residual deficits: Secondary | ICD-10-CM

## 2024-02-08 DIAGNOSIS — D696 Thrombocytopenia, unspecified: Secondary | ICD-10-CM | POA: Diagnosis present

## 2024-02-08 DIAGNOSIS — Z91041 Radiographic dye allergy status: Secondary | ICD-10-CM

## 2024-02-08 DIAGNOSIS — I4891 Unspecified atrial fibrillation: Secondary | ICD-10-CM | POA: Diagnosis present

## 2024-02-08 DIAGNOSIS — J9601 Acute respiratory failure with hypoxia: Principal | ICD-10-CM | POA: Diagnosis present

## 2024-02-08 DIAGNOSIS — Z66 Do not resuscitate: Secondary | ICD-10-CM | POA: Diagnosis present

## 2024-02-08 DIAGNOSIS — J440 Chronic obstructive pulmonary disease with acute lower respiratory infection: Secondary | ICD-10-CM | POA: Diagnosis present

## 2024-02-08 DIAGNOSIS — Z8249 Family history of ischemic heart disease and other diseases of the circulatory system: Secondary | ICD-10-CM

## 2024-02-08 DIAGNOSIS — E875 Hyperkalemia: Secondary | ICD-10-CM | POA: Diagnosis present

## 2024-02-08 DIAGNOSIS — J189 Pneumonia, unspecified organism: Secondary | ICD-10-CM | POA: Diagnosis present

## 2024-02-08 DIAGNOSIS — R1310 Dysphagia, unspecified: Secondary | ICD-10-CM | POA: Diagnosis present

## 2024-02-08 DIAGNOSIS — Z803 Family history of malignant neoplasm of breast: Secondary | ICD-10-CM

## 2024-02-08 DIAGNOSIS — F17210 Nicotine dependence, cigarettes, uncomplicated: Secondary | ICD-10-CM | POA: Diagnosis present

## 2024-02-08 DIAGNOSIS — I447 Left bundle-branch block, unspecified: Secondary | ICD-10-CM | POA: Diagnosis present

## 2024-02-08 DIAGNOSIS — I252 Old myocardial infarction: Secondary | ICD-10-CM

## 2024-02-08 DIAGNOSIS — I255 Ischemic cardiomyopathy: Secondary | ICD-10-CM | POA: Diagnosis present

## 2024-02-08 DIAGNOSIS — R0602 Shortness of breath: Secondary | ICD-10-CM | POA: Diagnosis present

## 2024-02-08 DIAGNOSIS — Z7982 Long term (current) use of aspirin: Secondary | ICD-10-CM | POA: Diagnosis not present

## 2024-02-08 DIAGNOSIS — I2510 Atherosclerotic heart disease of native coronary artery without angina pectoris: Secondary | ICD-10-CM | POA: Diagnosis present

## 2024-02-08 DIAGNOSIS — Z79899 Other long term (current) drug therapy: Secondary | ICD-10-CM | POA: Diagnosis not present

## 2024-02-08 DIAGNOSIS — Z8546 Personal history of malignant neoplasm of prostate: Secondary | ICD-10-CM

## 2024-02-08 DIAGNOSIS — G2581 Restless legs syndrome: Secondary | ICD-10-CM | POA: Diagnosis present

## 2024-02-08 DIAGNOSIS — Z87442 Personal history of urinary calculi: Secondary | ICD-10-CM

## 2024-02-08 DIAGNOSIS — I340 Nonrheumatic mitral (valve) insufficiency: Secondary | ICD-10-CM | POA: Diagnosis not present

## 2024-02-08 DIAGNOSIS — Z7984 Long term (current) use of oral hypoglycemic drugs: Secondary | ICD-10-CM

## 2024-02-08 DIAGNOSIS — E785 Hyperlipidemia, unspecified: Secondary | ICD-10-CM | POA: Diagnosis present

## 2024-02-08 DIAGNOSIS — K219 Gastro-esophageal reflux disease without esophagitis: Secondary | ICD-10-CM | POA: Diagnosis present

## 2024-02-08 DIAGNOSIS — I110 Hypertensive heart disease with heart failure: Secondary | ICD-10-CM | POA: Diagnosis present

## 2024-02-08 DIAGNOSIS — I714 Abdominal aortic aneurysm, without rupture, unspecified: Secondary | ICD-10-CM | POA: Diagnosis present

## 2024-02-08 DIAGNOSIS — I2690 Septic pulmonary embolism without acute cor pulmonale: Secondary | ICD-10-CM | POA: Diagnosis not present

## 2024-02-08 DIAGNOSIS — Z95810 Presence of automatic (implantable) cardiac defibrillator: Secondary | ICD-10-CM | POA: Diagnosis not present

## 2024-02-08 DIAGNOSIS — I5021 Acute systolic (congestive) heart failure: Secondary | ICD-10-CM | POA: Diagnosis not present

## 2024-02-08 DIAGNOSIS — F32A Depression, unspecified: Secondary | ICD-10-CM | POA: Diagnosis present

## 2024-02-08 DIAGNOSIS — G4700 Insomnia, unspecified: Secondary | ICD-10-CM | POA: Diagnosis present

## 2024-02-08 DIAGNOSIS — Z801 Family history of malignant neoplasm of trachea, bronchus and lung: Secondary | ICD-10-CM

## 2024-02-08 DIAGNOSIS — N400 Enlarged prostate without lower urinary tract symptoms: Secondary | ICD-10-CM | POA: Diagnosis present

## 2024-02-08 DIAGNOSIS — I5023 Acute on chronic systolic (congestive) heart failure: Secondary | ICD-10-CM | POA: Diagnosis present

## 2024-02-08 DIAGNOSIS — Z888 Allergy status to other drugs, medicaments and biological substances status: Secondary | ICD-10-CM

## 2024-02-08 DIAGNOSIS — Z8679 Personal history of other diseases of the circulatory system: Secondary | ICD-10-CM

## 2024-02-08 DIAGNOSIS — Z833 Family history of diabetes mellitus: Secondary | ICD-10-CM

## 2024-02-08 LAB — COMPREHENSIVE METABOLIC PANEL WITH GFR
ALT: 19 U/L (ref 0–44)
AST: 31 U/L (ref 15–41)
Albumin: 4 g/dL (ref 3.5–5.0)
Alkaline Phosphatase: 79 U/L (ref 38–126)
Anion gap: 11 (ref 5–15)
BUN: 27 mg/dL — ABNORMAL HIGH (ref 8–23)
CO2: 28 mmol/L (ref 22–32)
Calcium: 9.5 mg/dL (ref 8.9–10.3)
Chloride: 100 mmol/L (ref 98–111)
Creatinine, Ser: 1.21 mg/dL (ref 0.61–1.24)
GFR, Estimated: >60 mL/min
Glucose, Bld: 114 mg/dL — ABNORMAL HIGH (ref 70–99)
Potassium: 3.8 mmol/L (ref 3.5–5.1)
Sodium: 139 mmol/L (ref 135–145)
Total Bilirubin: 1.1 mg/dL (ref 0.0–1.2)
Total Protein: 6 g/dL — ABNORMAL LOW (ref 6.5–8.1)

## 2024-02-08 LAB — CBC
HCT: 46.2 % (ref 39.0–52.0)
HCT: 47.8 % (ref 39.0–52.0)
Hemoglobin: 15.2 g/dL (ref 13.0–17.0)
Hemoglobin: 15.4 g/dL (ref 13.0–17.0)
MCH: 32.3 pg (ref 26.0–34.0)
MCH: 32.3 pg (ref 26.0–34.0)
MCHC: 32.9 g/dL (ref 30.0–36.0)
MCHC: 32.2 g/dL (ref 30.0–36.0)
MCV: 98.3 fL (ref 80.0–100.0)
MCV: 100.2 fL — ABNORMAL HIGH (ref 80.0–100.0)
Platelets: 122 K/uL — ABNORMAL LOW (ref 150–400)
Platelets: 121 K/uL — ABNORMAL LOW (ref 150–400)
RBC: 4.7 MIL/uL (ref 4.22–5.81)
RBC: 4.77 MIL/uL (ref 4.22–5.81)
RDW: 15.2 % (ref 11.5–15.5)
RDW: 15.4 % (ref 11.5–15.5)
WBC: 6.1 K/uL (ref 4.0–10.5)
WBC: 5.3 K/uL (ref 4.0–10.5)
nRBC: 0 % (ref 0.0–0.2)
nRBC: 0 % (ref 0.0–0.2)

## 2024-02-08 LAB — BASIC METABOLIC PANEL WITH GFR
BUN/Creatinine Ratio: 24 (calc) — ABNORMAL HIGH (ref 6–22)
BUN: 26 mg/dL — ABNORMAL HIGH (ref 7–25)
CO2: 30 mmol/L (ref 20–32)
Calcium: 9.6 mg/dL (ref 8.6–10.3)
Chloride: 100 mmol/L (ref 98–110)
Creat: 1.08 mg/dL (ref 0.70–1.28)
Glucose, Bld: 108 mg/dL — ABNORMAL HIGH (ref 65–99)
Potassium: 4 mmol/L (ref 3.5–5.3)
Sodium: 139 mmol/L (ref 135–146)
eGFR: 70 mL/min/1.73m2

## 2024-02-08 LAB — PROCALCITONIN: Procalcitonin: <0.1 ng/mL

## 2024-02-08 LAB — TROPONIN T, HIGH SENSITIVITY
Troponin T High Sensitivity: 63 ng/L — ABNORMAL HIGH (ref 0–19)
Troponin T High Sensitivity: 56 ng/L — ABNORMAL HIGH (ref 0–19)

## 2024-02-08 LAB — MAGNESIUM: Magnesium: 2.4 mg/dL (ref 1.7–2.4)

## 2024-02-08 LAB — D-DIMER, QUANTITATIVE: D-Dimer, Quant: 3.94 ug{FEU}/mL — ABNORMAL HIGH (ref 0.00–0.50)

## 2024-02-08 LAB — BRAIN NATRIURETIC PEPTIDE: Brain Natriuretic Peptide: >4200 pg/mL — ABNORMAL HIGH

## 2024-02-08 LAB — PRO BRAIN NATRIURETIC PEPTIDE: Pro Brain Natriuretic Peptide: 32929 pg/mL — ABNORMAL HIGH

## 2024-02-08 MED ORDER — IOHEXOL 350 MG/ML SOLN
75.0000 mL | Freq: Once | INTRAVENOUS | Status: AC | PRN
  Administered 2024-02-08: 75 mL via INTRAVENOUS

## 2024-02-08 MED ORDER — ONDANSETRON HCL 4 MG/2ML IJ SOLN: 4.0000 mg | Freq: Four times a day (QID) | INTRAMUSCULAR | Status: DC | PRN

## 2024-02-08 MED ORDER — NICOTINE 21 MG/24HR TD PT24
21.0000 mg | MEDICATED_PATCH | Freq: Every day | TRANSDERMAL | Status: DC
  Administered 2024-02-08 – 2024-02-10 (×2): 21 mg via TRANSDERMAL
  Filled 2024-02-08 (×3): qty 1

## 2024-02-08 MED ORDER — FUROSEMIDE 10 MG/ML IJ SOLN
40.0000 mg | Freq: Two times a day (BID) | INTRAMUSCULAR | Status: DC
  Administered 2024-02-08 – 2024-02-09 (×2): 40 mg via INTRAVENOUS
  Filled 2024-02-08 (×2): qty 4

## 2024-02-08 MED ORDER — SODIUM CHLORIDE 0.9 % IV SOLN: 500.0000 mg | Freq: Once | INTRAVENOUS | Status: DC

## 2024-02-08 MED ORDER — ALPRAZOLAM 0.25 MG PO TABS
0.2500 mg | ORAL_TABLET | Freq: Every evening | ORAL | Status: DC | PRN
  Administered 2024-02-08 – 2024-02-09 (×2): 0.25 mg via ORAL
  Filled 2024-02-08 (×2): qty 1

## 2024-02-08 MED ORDER — SODIUM CHLORIDE 0.9% FLUSH
3.0000 mL | Freq: Two times a day (BID) | INTRAVENOUS | Status: DC
  Administered 2024-02-08 – 2024-02-09 (×3): 3 mL via INTRAVENOUS

## 2024-02-08 MED ORDER — BISACODYL 5 MG PO TBEC: 5.0000 mg | DELAYED_RELEASE_TABLET | Freq: Every day | ORAL | Status: DC | PRN

## 2024-02-08 MED ORDER — ACETAMINOPHEN 650 MG RE SUPP: 650.0000 mg | Freq: Four times a day (QID) | RECTAL | Status: DC | PRN

## 2024-02-08 MED ORDER — HEPARIN SODIUM (PORCINE) 5000 UNIT/ML IJ SOLN
5000.0000 [IU] | Freq: Two times a day (BID) | INTRAMUSCULAR | Status: DC
  Administered 2024-02-08 – 2024-02-09 (×2): 5000 [IU] via SUBCUTANEOUS
  Filled 2024-02-08 (×2): qty 1

## 2024-02-08 MED ORDER — SODIUM CHLORIDE 0.9 % IV SOLN: 1.0000 g | INTRAVENOUS | Status: DC

## 2024-02-08 MED ORDER — POLYETHYLENE GLYCOL 3350 17 G PO PACK: 17.0000 g | PACK | Freq: Every day | ORAL | Status: DC | PRN

## 2024-02-08 MED ORDER — DOXYCYCLINE HYCLATE 100 MG PO TABS: 100.0000 mg | ORAL_TABLET | Freq: Two times a day (BID) | ORAL | Status: DC

## 2024-02-08 MED ORDER — ONDANSETRON HCL 4 MG PO TABS: 4.0000 mg | ORAL_TABLET | Freq: Four times a day (QID) | ORAL | Status: DC | PRN

## 2024-02-08 MED ORDER — DOXYCYCLINE HYCLATE 100 MG PO TABS
100.0000 mg | ORAL_TABLET | Freq: Once | ORAL | Status: AC
  Administered 2024-02-08: 100 mg via ORAL
  Filled 2024-02-08: qty 1

## 2024-02-08 MED ORDER — HEPARIN SODIUM (PORCINE) 5000 UNIT/ML IJ SOLN: 5000.0000 [IU] | Freq: Three times a day (TID) | INTRAMUSCULAR | Status: DC

## 2024-02-08 MED ORDER — SODIUM CHLORIDE 0.9 % IV SOLN
1.0000 g | Freq: Once | INTRAVENOUS | Status: AC
  Administered 2024-02-08: 1 g via INTRAVENOUS
  Filled 2024-02-08: qty 10

## 2024-02-08 MED ORDER — FUROSEMIDE 10 MG/ML IJ SOLN
40.0000 mg | Freq: Once | INTRAMUSCULAR | Status: AC
  Administered 2024-02-08: 40 mg via INTRAVENOUS
  Filled 2024-02-08: qty 4

## 2024-02-08 MED ORDER — ACETAMINOPHEN 325 MG PO TABS: 650.0000 mg | ORAL_TABLET | Freq: Four times a day (QID) | ORAL | Status: DC | PRN

## 2024-02-08 MED ORDER — ALBUTEROL SULFATE (2.5 MG/3ML) 0.083% IN NEBU: 3.0000 mL | INHALATION_SOLUTION | Freq: Four times a day (QID) | RESPIRATORY_TRACT | Status: DC | PRN

## 2024-02-08 MED ORDER — ASPIRIN 81 MG PO TBEC
81.0000 mg | DELAYED_RELEASE_TABLET | Freq: Every day | ORAL | Status: DC
  Administered 2024-02-08 – 2024-02-10 (×3): 81 mg via ORAL
  Filled 2024-02-08 (×3): qty 1

## 2024-02-08 NOTE — Evaluation (Signed)
 Physical Therapy Evaluation Patient Details Name: Daniel [name]. MRN: [id number] DOB: Jun 20, 1945 Today's Date: 02/08/2024  History of Present Illness  Daniel [name]. is a 79 y.o. M who presented to [facility] ED 02/08/24 for SOB, worsening BLE edema, and ~8lb weight gain. Chest x-ray concerning for infiltrate versus atelectasis. CTA chest showed no definite evidence of large PE, multiple filling defect in multiple lower lobe branches of both pulmonary arteries which is difficult to determine if these actually represent PE or potentially artifact; bilateral pleural effusion R>L;  7mm nodule is noted posteriorly in right upper lobe. PMHx: HTN, HLD, CAD, NSTEMI, CHF with EF of 20%, prostate cancer, abdominal aortic aneurysm, and ICD with recent upgrade to CRT (01/30/24).   Clinical Impression  Pt admitted with above diagnosis. PTA, pt was independent with functional mobility, ADLs/IADLs, and driving. He lives with his wife in a one story house with a threshold to enter. Pt currently with functional limitations due to the deficits listed below (see PT Problem List). He performed bed mobility with modI and required close supervision for transfers as well as CGA for gait. Pt is currently limited by impaired cardiopulmonary endurance, generalized BLE weakness, 2+ pitting edema in BLE, and decreased activity tolerance. Pt experienced SOB and DOE 2/4, but SpO2 >93% throughout mobility. Cued PLB technique.   Pt will benefit from acute skilled PT to increase his independence and safety with mobility to allow discharge. Recommend HHPT to increase strength, improve balance, decrease fall risk, advance activity tolerance, and optimize safety within the home environment.     02/08/24 1700  Vitals  Patient Position (if appropriate) Orthostatic Vitals  Orthostatic Lying   BP- Lying 144/84  Orthostatic Sitting  BP- Sitting 131/85  Orthostatic Standing at 0 minutes  BP- Standing at 0 minutes (!) 140/91  Orthostatic Standing  at 3 minutes  BP- Standing at 3 minutes (!) 134/100       If plan is discharge home, recommend the following: A little help with walking and/or transfers;A little help with bathing/dressing/bathroom;Assist for transportation;Help with stairs or ramp for entrance   Can travel by private vehicle        Equipment Recommendations None recommended by PT  Recommendations for Other Services       Functional Status Assessment Patient has had a recent decline in their functional status and demonstrates the ability to make significant improvements in function in a reasonable and predictable amount of time.     Precautions / Restrictions Precautions Precautions: Fall Recall of Precautions/Restrictions: Intact Restrictions Weight Bearing Restrictions Per Provider Order: No      Mobility  Bed Mobility Overal bed mobility: Modified Independent             General bed mobility comments: HOB slightly elevated.    Transfers Overall transfer level: Needs assistance Equipment used: None Transfers: Sit to/from Stand Sit to Stand: Supervision           General transfer comment: Pt stood from stretcher. He pushed up with BUE support. Powered up without physical assist. Close supervision for safety. Good eccentric control.    Ambulation/Gait Ambulation/Gait assistance: Contact guard assist Gait Distance (Feet): 200 Feet Assistive device: None Gait Pattern/deviations: Step-through pattern, Decreased stride length Gait velocity: decr Gait velocity interpretation: <1.8 ft/sec, indicate of risk for recurrent falls   General Gait Details: Pt ambulated with a reciprocal gait pattern, even weight shift, and good foot clearence. He navigated room/hallway well, no LOB. Cued PLB technique throughout. Pt experience  SOB and DOE 2/4.  Stairs            Wheelchair Mobility     Tilt Bed    Modified Rankin (Stroke Patients Only)       Balance Overall balance assessment:  Needs assistance Sitting-balance support: No upper extremity supported, Feet supported Sitting balance-Leahy Scale: Fair Sitting balance - Comments: Sat EOB with supervision   Standing balance support: No upper extremity supported, During functional activity Standing balance-Leahy Scale: Fair Standing balance comment: Pt ambulated with CGA.                             Pertinent Vitals/Pain Pain Assessment Pain Assessment: No/denies pain    Home Living Family/patient expects to be discharged to:: Private residence Living Arrangements: Spouse/significant other (Wife) Available Help at Discharge: Family;Available 24 hours/day Type of Home: House Home Access: Stairs to enter;Ramped entrance   Entrance Stairs-Number of Steps: 1 (threshold)   Home Layout: One level Home Equipment: [employer] (2 wheels);Wheelchair - manual;BSC/3in1;Hand held shower head;Grab bars - tub/shower      Prior Function Prior Level of Function : Independent/Modified Independent             Mobility Comments: Indep no AD. Denies fall hx. ADLs Comments: Indep with ADLs. Pt reports he is waiting on his wife, she is still a little handicap from her fall in January 2025. Housework and cooking. Manages his own medications. Drive.     Extremity/Trunk Assessment   Upper Extremity Assessment Upper Extremity Assessment: Defer to OT evaluation    Lower Extremity Assessment Lower Extremity Assessment: Generalized weakness    Cervical / Trunk Assessment Cervical / Trunk Assessment: Normal  Communication   Communication Communication: No apparent difficulties    Cognition Arousal: Alert Behavior During Therapy: WFL for tasks assessed/performed   PT - Cognitive impairments: No apparent impairments                       PT - Cognition Comments: Pt A,Ox4 Following commands: Intact       Cueing Cueing Techniques: Verbal cues     General Comments General comments (skin  integrity, edema, etc.): VSS on RA. SpO2 >/= 94%. Pt with +2 pitting edema in BLE.    Exercises     Assessment/Plan    PT Assessment Patient needs continued PT services  PT Problem List Cardiopulmonary status limiting activity;Decreased activity tolerance;Decreased strength;Decreased balance;Decreased mobility       PT Treatment Interventions DME instruction;Gait training;Functional mobility training;Therapeutic activities;Therapeutic exercise;Balance training;Patient/family education    PT Goals (Current goals can be found in the Care Plan section)  Acute Rehab PT Goals Patient Stated Goal: Return Home PT Goal Formulation: With patient Time For Goal Achievement: 02/22/24 Potential to Achieve Goals: Good    Frequency Min 2X/week     Co-evaluation               AM-PAC PT 6 Clicks Mobility  Outcome Measure Help needed turning from your back to your side while in a flat bed without using bedrails?: None Help needed moving from lying on your back to sitting on the side of a flat bed without using bedrails?: None Help needed moving to and from a bed to a chair (including a wheelchair)?: A Little Help needed standing up from a chair using your arms (e.g., wheelchair or bedside chair)?: A Little Help needed to walk in hospital room?: A Little Help  needed climbing 3-5 steps with a railing? : A Lot 6 Click Score: 19    End of Session Equipment Utilized During Treatment: Gait belt Activity Tolerance: Patient tolerated treatment well Patient left: in bed;with call bell/phone within reach;with family/visitor present Nurse Communication: Mobility status PT Visit Diagnosis: Muscle weakness (generalized) (M62.81);Other abnormalities of gait and mobility (R26.89)    Time: 8453-8391 PT Time Calculation (min) (ACUTE ONLY): 22 min   Charges:   PT Evaluation $PT Eval Moderate Complexity: 1 Mod   PT General Charges $$ ACUTE PT VISIT: 1 Visit         Randall SAUNDERS, PT,  DPT Acute Rehabilitation Services Office: [phone] Secure Chat Preferred  Delon CHRISTELLA Callander 02/08/2024, 5:03 PM

## 2024-02-08 NOTE — Consult Note (Addendum)
 "  As below, patient seen and examined.  Briefly he is a 79 year old male with past medical history of coronary artery disease status post coronary bypass and graft, ischemic cardiomyopathy, hyperlipidemia, hypertension, status post ICD with recent upgrade to CRT, prostate cancer, abdominal aortic aneurysm for evaluation of acute on chronic systolic congestive heart failure.  Echocardiogram September 2025 showed ejection fraction 20%, moderate left ventricular enlargement, mild left ventricular hypertrophy, moderate RV dysfunction, moderate biatrial enlargement, moderate to severe mitral regurgitation, mild aortic insufficiency.  Patient had CRT upgrade of ICD on December 30.  Patient states that since that time he has gained 8 pounds, and has had bilateral lower extremity edema and worsening dyspnea on exertion.  He has had orthopnea as well.  He denies chest pain, fevers, chills, productive cough or hemoptysis.  Cardiology now asked to evaluate.  CTA shows no definite evidence of large central pulmonary embolus but multiple filling defects pulmonary emboli versus artifact, bilateral pleural effusions, 7 mm right upper lobe nodule with follow-up recommended in 6 to 12 months.  Creatinine 1.21, liver functions normal, BNP greater than 4200, hemoglobin 15.4, ECG shows AV pacing.  1 acute on chronic systolic congestive heart failure-patient is volume overloaded on examination.  Will treat with Lasix  40 mg IV twice daily.  He was said to have had elevated potassium levels with spironolactone  in the past.  Will try 12.5 mg daily and follow.  Continue Farxiga .  Follow renal function closely.  We also discussed fluid restriction and low-sodium diet.  2 ischemic cardiomyopathy-patient apparently did not tolerate Entresto  or BiDil.  Also did not tolerate carvedilol , Toprol  or bisoprolol .  Will continue lisinopril.  3 coronary artery disease status post coronary bypass and graft-would continue aspirin .  He does not  tolerate statins.  4 ICD-he is status post CRT upgrade.  Device was interrogated with normal function.  5 hypertension-continue present medications.  He has not tolerated multiple other medicines in the past.  6 hyperlipidemia-intolerant to statins and apparently unwilling to try other lipid-lowering medications previously.  7 history of abdominal aortic aneurysm-follow-up as an outpatient.  Daniel Shallow, MD  Cardiology Consultation   Patient ID: Daniel [name]: [id number]; DOB: 02-Sep-1945  Admit date: 02/08/2024 Date of Consult: 02/08/2024  PCP:  Daniel Aleene DEL, MD   [facility] Providers Cardiologist:  Daniel Schilling, MD  Electrophysiologist:  Daniel Birmingham, MD     Patient Profile: Daniel [name]. is a 79 y.o. male with a hx of CAD s/p CABG with LIMA to D1, SVG to LAD, SVG to PDA on 05/12/2016, HLD  intolerant to statin, HTN, ICM with low EF, s/p ICD, prostate cancer, AAA, who is being seen 02/08/2024 for the evaluation of acute CHF at the request of Daniel [name].  History of Present Illness: Daniel [name] with above past medical history presented to the ER today complaining of worsening shortness of breath with exertion, orthopnea, worsening bilateral lower extremity over the past 2 days.  He has reported 8 pounds weight gain and felt Lasix  at home is not effective.  He was found hypoxic with pulse ox 89% on room air and required 2 L nasal cannula oxygen at ED. He had CRT upgrade 01/30/24, he felt the past 2 days he is getting more fluid in his body. He drinks 48 oz fluid daily. He takes lasix  40mg  daily, benazepril  20mg  BID, and farxiga  10mg  daily. His PCP told him to increase lasix  to 60mg  BID yesterday, he did last night  and felt there is no change.   Per chart review, he follows Dr. Lavona outpatient, he underwent CABG x 3 by [facility] on 05/12/2016 with LIMA to diagonal, SVG to LAD, SVG to PDA.  Postop course was complicated by atrial fibrillation had a brief  amiodarone  used.  Echocardiogram from 05/10/2016 showed LVEF 25 to 30%.  Subsequent echo from 07/2016 showed improved LVEF 30 to 35%.  He had trialed GDMT including carvedilol , Entresto , spironolactone  in the past, they were stopped due to hyperkalemia, swelling, hypotension, bradycardia, and suspected allergy in the past.  He was last seen in the office on /25/2025, did not wish further medication titration for heart failure, only takes medication when his blood pressure is elevated.    He was most recently hospitalized 10/2023 for acute heart failure. Most recent echo from 10/23/2023 showed LVEF 20%, mild concentric LVH, indeterminate diastolic parameter, no LV thrombus, moderately reduced RV systolic function, RVSP 36.7 mmHg, moderate LAE and RAE, moderate to severe MR, mild AI.  He was treated with IV Lasix  and discharged on Farxiga  10 mg daily, benazepril  10 mg daily, Lasix  20 mg daily. He underwent initial ICD implant 09/12/2019 by Dr. Waddell.  He developed RV pacing induced LBBB which was felt contributing to further EF decline.  He was noted to 70% pacing in the RV.  On 01/30/2024, he underwent successful CRT upgrade with removal of previous ICD and insertion of a new coronary sinus pacing lead and insertion of a new biventricular [facility] Jude ICD.   He had visited primary care yesterday for follow-up of uncontrolled hypertension.  He was previously advised to increase metoprolol  to 25 mg twice daily, continue benazepril  20 mg twice daily and continue Lasix  20 mg twice daily.  Reportedly his weight is 144 to 146 pounds typically.   He reports taking metoprolol  briefly but stopped due to insomnia.  His symptom was worse for heart failure and was advised to take Lasix  60 mg twice daily for the next 2 days before returning to 40 mg twice daily.  Per ER workup today CMP revealed elevated BUN 27 and creatinine 1.21, otherwise unremarkable.  proBNP 32,929.  CBC with mild thrombocytopenia 122k.  D-dimer elevated  3.94.  Chest x-ray concerning for infiltrate versus atelectasis.  CTA chest showed no definite evidence of large PE, multiple filling defect in multiple lower lobe branches of both pulmonary arteries which is difficult to determine if these actually represent PE or potentially artifact; bilateral pleural effusion R>L;  7mm nodule is noted posteriorly in right upper lobe.  Blood culture x 2 sent.     Past Medical History:  Diagnosis Date   Abdominal aortic aneurysm 01/2021   4 cm, infrarenal.  Also enlarged left common iliac artery--vascular eval 03/2021->rpt aortic u/s 06/01/22 stable Aorta 4.2 cm.  Bilat common iliac aneurisms and left common femoral aneurism-->f/u 1 yr   Acute prostatitis 06/19/2013   Anxiety    Atrial fibrillation (HCC)    Limited episode, emergency room, June, 2013, spontaneous conversion to sinus rhythm, was evaluated By Dr. Micky 2013- no further visits.  Post-op (CABG) a-fib, converted on amio but pt self d/c'd this med due to DOE/side effect profile.   Blurred vision 01/01/2020   Bradycardia 06/20/2017   CAD (coronary artery disease) 05/2016   a. 05/2016: NSTEMI with cath showing 3-vessel disease. Underwent CABG on 05/12/16 with LIMA-D1, SVG-LAD, and SVG-PDA.    COPD (chronic obstructive pulmonary disease) (HCC) fall 2016   Bullous changes noted on lower lung  images of CT abd done by urology   Diverticulitis    Double vision 01/2020   MG ab w/u NEG.  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 Unable to have MRI due to having shrapnel in forehead.   Hyperlipidemia    statin intolerant. Pt declined advanced lipid clinic referral 08/2019, 03/2020, and 01/2021.    Hypertension    Ischemic cardiomyopathy 05/2016   EF 20-25% at the time of NSTEMI/CABG.  Repeat EF 07/2016 30-35%.  07/2017 EF 25-30%, pt intol of entresto , bidil, and BB--for ICD 03/2019.   Microscopic hematuria Fall 2016   CT nl except nonobstructing stones.  Cystoscopy normal 12/30/14 (Dr. 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  Urol (Dr. Beola) at [facility]; Dr. Beola referred him to Dr. Renda 03/2015: patient got bilat nerve sparing , robot assisted laparoscopic radical prostatectomy and pelvic lymphadenectomy.  Urol f/u, PSA surveillance showing very mild uptrend of PSA but not to the level of biochemical recurrence as of 05/2021 urology follow-up   RLS (restless legs syndrome)    Statin intolerance    Tobacco dependence    down to 1-2 cigs per day as of 11/2015.  Restarted 08/2016.   Urinary retention 05/2015   occurred s/p foley removal   UTI (urinary tract infection) 11/14/2014    Past Surgical History:  Procedure Laterality Date   aortic ultrasound  07/2012   2014 NO AAA.  01/2021-->4 cm AAA with R common iliac ectasia-->vasc surg ref   BIV ICD INSERTION CRT-D     01/30/24   BIV ICD INSERTION CRT-D N/A 01/30/2024   Procedure: BIV ICD INSERTION CRT-D;  Surgeon: Waddell Daniel ORN, MD;  Location: [facility] INVASIVE CV LAB;  Service: Cardiovascular;  Laterality: N/A;   COLONOSCOPY  approx 2011 per pt   Done by surgeon in [place] after a bout of diverticulitis; normal per pt report--was told to repeat in 10 yrs.   CORONARY ARTERY BYPASS GRAFT N/A 05/12/2016   Procedure: CORONARY ARTERY BYPASS GRAFTING (CABG) TIMES THREE USING LEFT INTERNAL MAMMARY ARTERY AND RIGHT SAPHENOUS LEG VEIN HARVESTED ENDOSCOPICALLY;  Surgeon: Elspeth JAYSON Millers, MD;  Location: [facility] OR;  Service: Open  Heart Surgery;  Laterality: N/A;   ICD IMPLANT N/A 04/12/2019   Procedure: ICD IMPLANT;  Surgeon: Waddell Daniel ORN, MD;  Location: [facility] INVASIVE CV LAB;  Service: Cardiovascular;  Laterality: N/A;   INGUINAL HERNIA REPAIR  2003 and 2004   Both sides.   LAPAROSCOPY  2017   LEFT HEART CATH AND CORONARY ANGIOGRAPHY N/A 05/09/2016   Procedure: Left Heart Cath and Coronary Angiography;  Surgeon: Debby DELENA Sor, MD;  Location: [facility] INVASIVE CV LAB;  Service: Cardiovascular;  Laterality: N/A;   LITHOTRIPSY  2004   Dr. Verdene in [place].   LYMPHADENECTOMY Bilateral 04/30/2015   Procedure: PELVIC LYMPHADENECTOMY;  Surgeon: Gretel Renda, MD;  Location: [facility] ORS;  Service: Urology;  Laterality: Bilateral;   PROSTATE BIOPSY  02/05/2015  Prostate adenocarcinoma: pt considering treatment options as of 02/19/15   ROBOT ASSISTED LAPAROSCOPIC RADICAL PROSTATECTOMY N/A 04/30/2015   Procedure: XI ROBOTIC ASSISTED LAPAROSCOPIC RADICAL PROSTATECTOMY LEVEL 2;  Surgeon: Gretel Ferrara, MD;  Location: [facility] ORS;  Service: Urology;  Laterality: N/A;   TEE WITHOUT CARDIOVERSION N/A 05/12/2016   Procedure: TRANSESOPHAGEAL ECHOCARDIOGRAM (TEE);  Surgeon: Elspeth JAYSON Millers, MD;  Location: [facility] OR;  Service: Open Heart Surgery;  Laterality: N/A;   TRANSTHORACIC ECHOCARDIOGRAM  03/22/2006; 05/2016; 07/2016; 07/2017; 01/02/20   2008: EF 65%, normal valves, no wall motion abnormalties, LA size normal.  05/2016 in context of non-STEMI, EF 20-25%, mild AV and MV regurg.  08/05/16: EF 30-35%, akinesis of mid lateral myoc, grd II DD, mild aortic 07/2017: EF 25-30%, mult areas of akinesis, grd I DD. 01/2020 EF 25-30%, sev LV dsfxn, valves fine, no WMA.  10/2023 EF 20%, dec LV and RV fx, ele PA press, mod/sev MR     Home Medications:  Prior to Admission medications  Medication Sig Start Date End Date Taking? Authorizing Provider  albuterol  (VENTOLIN  HFA) 108 (90 Base) MCG/ACT inhaler Inhale 1-2 puffs into the lungs every 6 (six) hours as needed for  wheezing or shortness of breath. 10/25/23  Yes Ricky Fines, MD  ALPRAZolam  (XANAX ) 0.25 MG tablet tAKE 1 to 2 tabLETS BY MOUTH AT BEDTIME AS NEEDED FOR insomnia/restless legs 10/18/23  Yes McGowen, Aleene DEL, MD  aspirin  EC 81 MG tablet Take 1 tablet (81 mg total) by mouth daily. Swallow whole. 02/05/20  Yes Lavona Agent, MD  benazepril  (LOTENSIN ) 20 MG tablet TAKE ONE TABLET BY MOUTH TWICE DAILY 01/16/24  Yes McGowen, Aleene DEL, MD  dapagliflozin  propanediol (FARXIGA ) 10 MG TABS tablet Take 10 mg by mouth daily.   Yes [provider]  furosemide  (LASIX ) 20 MG tablet Take 1 tablet (20 mg total) by mouth daily. 11/16/23  Yes Milford, Harlene HERO, FNP  loratadine  (CLARITIN ) 10 MG tablet Take 1 tablet (10 mg total) by mouth daily. Patient taking differently: Take 10 mg by mouth daily as needed for allergies. 01/04/24  Yes Sherlynn Madden, MD  omeprazole (PRILOSEC) 20 MG capsule Take 20 mg by mouth daily as needed (for acid reflux). 07/31/23  Yes [provider]  Cetirizine  HCl (ZYRTEC  ALLERGY) 10 MG CAPS Take 1 capsule (10 mg total) by mouth daily. 11/06/14 11/06/14  Lenor Hollering, MD    Scheduled Meds:  aspirin  EC  81 mg Oral Daily   doxycycline   100 mg Oral Once   furosemide   40 mg Intravenous BID   heparin   5,000 Units Subcutaneous Q8H   sodium chloride  flush  3 mL Intravenous Q12H   Continuous Infusions:  PRN Meds: acetaminophen  **OR** acetaminophen , albuterol , bisacodyl , ondansetron  **OR** ondansetron  (ZOFRAN ) IV, polyethylene glycol  Allergies:   Allergies[1]  Social History:   Social History   Socioeconomic History   Marital status: Married    Spouse name: Not on file   Number of children: Not on file   Years of education: Not on file   Highest education level: Not on file  Occupational History   Not on file  Tobacco Use   Smoking status: Some Days    Current packs/day: 1.00    Average packs/day: 1 pack/day for 55.0 years (55.0 ttl pk-yrs)    Types:  Cigarettes   Smokeless tobacco: Never   Tobacco comments:    quit in 2017  Vaping Use   Vaping status: Never Used  Substance and Sexual Activity   Alcohol  use: No   Drug use: No   Sexual activity: Not on file  Other Topics Concern   Not on file  Social History Narrative   Married, 2 grown children.HS education. Orig from [place], lives there now.Occupation: maintenance.  Drives a tractor a lot, drives a pickup truck a lot, rides horses a lot.Tobacco: 20 pack-yr hx (current as of 07/2012)No drugs.Alcohol: none   Social Drivers of Health   Tobacco Use: High Risk (02/07/2024)   Patient History    Smoking Tobacco Use: Some Days    Smokeless Tobacco Use: Never    Passive Exposure: Not on file  Financial Resource Strain: Low Risk (09/29/2021)   Overall Financial Resource Strain (CARDIA)    Difficulty of Paying Living Expenses: Not hard at all  Food Insecurity: No Food Insecurity (10/23/2023)   Epic    Worried About Radiation Protection Practitioner of Food in the Last Year: Never true    Ran Out of Food in the Last Year: Never true  Transportation Needs: No Transportation Needs (10/23/2023)   Epic    Lack of Transportation (Medical): No    Lack of Transportation (Non-Medical): No  Physical Activity: Inactive (09/29/2021)   Exercise Vital Sign    Days of Exercise per Week: 0 days    Minutes of Exercise per Session: 0 min  Stress: No Stress Concern Present (09/29/2021)   [employer] of Occupational Health - Occupational Stress Questionnaire    Feeling of Stress : Not at all  Social Connections: Socially Integrated (10/23/2023)   Social Connection and Isolation Panel    Frequency of Communication with Friends and Family: More than three times a week    Frequency of Social Gatherings with Friends and Family: Twice a week    Attends Religious Services: More than 4 times per year    Active Member of [employer] or Organizations: Yes    Attends Banker Meetings: More than 4 times per year     Marital Status: Married  Catering Manager Violence: Not At Risk (10/23/2023)   Epic    Fear of Current or Ex-Partner: No    Emotionally Abused: No    Physically Abused: No    Sexually Abused: No  Depression (PHQ2-9): Low Risk (10/26/2023)   Depression (PHQ2-9)    PHQ-2 Score: 0  Alcohol Screen: Low Risk (09/29/2021)   Alcohol Screen    Last Alcohol Screening Score (AUDIT): 0  Housing: Low Risk (10/23/2023)   Epic    Unable to Pay for Housing in the Last Year: No    Number of Times Moved in the Last Year: 0    Homeless in the Last Year: No  Utilities: Not At Risk (10/23/2023)   Epic    Threatened with loss of utilities: No  Health Literacy: Not on file    Family History:    Family History  Problem Relation Age of Onset   Hypertension Mother    Cancer - Other Mother 27       breast   Cancer Father        Lung ca age 55   Diabetes Sister      ROS:  Constitutional: Denied fever, chills, malaise, night sweats Eyes: Denied vision change or loss Ears/Nose/Mouth/Throat: Denied ear ache, sore throat, coughing, sinus pain Cardiovascular: denied chest pain/pressure Respiratory: see HPI  Gastrointestinal: Denied nausea, vomiting, abdominal pain, diarrhea Genital/Urinary: Denied dysuria, hematuria, urinary frequency/urgency Musculoskeletal: Denied muscle ache, joint pain, weakness Skin: Denied rash, wound Neuro: Denied headache, dizziness, syncope Psych:  Denied history of depression/anxiety  Endocrine: Denied history of diabetes     Physical Exam/Data: Vitals:   02/08/24 0930 02/08/24 0940 02/08/24 1000 02/08/24 1223  BP: 138/88  (!) 146/87   Pulse: 66  66   Resp: (!) 23  17   Temp:    98.2 F (36.8 C)  TempSrc:    Oral  SpO2: (!) 89% 100% 100%   Weight:      Height:       No intake or output data in the 24 hours ending 02/08/24 1236    02/08/2024    8:41 AM 02/08/2024    8:40 AM 02/07/2024    9:26 AM  Last 3 Weights  Weight (lbs) 152 lb 144 lb 158 lb  Weight (kg)  68.947 kg 65.318 kg 71.668 kg     Body mass index is 21.81 kg/m.   Vitals:  Vitals:   02/08/24 1000 02/08/24 1223  BP: (!) 146/87   Pulse: 66   Resp: 17   Temp:  98.2 F (36.8 C)  SpO2: 100%    General Appearance: In no apparent distress, sitting in bed  HEENT: Normocephalic, atraumatic.  Neck: Supple, trachea midline, no JVDs Cardiovascular: Regular rate and rhythm, normal S1-S2,  no murmur Respiratory: Resting breathing unlabored, lungs sounds with rales at base bilaterally, no use of accessory muscles. On room air.   Gastrointestinal: Bowel sounds positive, abdomen soft, non-tender, non-distended.  Extremities: Able to move all extremities in bed without difficulty, BLE 2+ Musculoskeletal: Normal muscle bulk and tone, no rash Neurologic: Alert, oriented to person, place and time. No gross focal deficit  Psychiatric: Normal affect. Mood is appropriate.  External device: Left chest ICD with steri strip in place    EKG:  The EKG was personally reviewed and demonstrates:   paced rhythm   Telemetry:  Telemetry was personally reviewed and demonstrates:    AV paced rhythm   Relevant CV Studies:  AICD interrogation 01/05/2024:  Remote Defibrillator interrogation reviewed. Presenting Rhythm:A-V dual paced. Battery and lead parameters stable with stable capture and sensing. Device programming is appropriate. No arrhythmias noted. Continue remote monitoring.    Echocardiogram 10/23/2023:  1. Left ventricular ejection fraction, by estimation, is approximately  20%. The left ventricle has severely decreased function. The left  ventricle demonstrates regional wall motion abnormalities (see scoring  diagram/findings for description). The left  ventricular internal cavity size was moderately dilated. There is mild  concentric left ventricular hypertrophy. Left ventricular diastolic  parameters are indeterminate.   2. No formed LV mural thrombus evident by Definity  contrast.   3.  Right ventricular systolic function is moderately reduced. The right  ventricular size is normal. There is mildly elevated pulmonary artery  systolic pressure. The estimated right ventricular systolic pressure is  36.7 mmHg.   4. Left atrial size was moderately dilated.   5. Right atrial size was moderately dilated.   6. The mitral valve is degenerative. Moderate to severe mitral valve  regurgitation.   7. The aortic valve is tricuspid. There is mild calcification of the  aortic valve. Aortic valve regurgitation is mild. Aortic regurgitation PHT  measures 846 msec.   8. The inferior vena cava is dilated in size with >50% respiratory  variability, suggesting right atrial pressure of 8 mmHg.   Comparison(s): Prior images reviewed side by side. LVEF approximately 20%  with wall motion abnormalties consistent with ischemic cardiomyopathy.  Moderate to severe mitral regurgitation.   LHC 05/09/2016:  Mid RCA lesion, 100 %  stenosed. Dist Cx lesion, 100 %stenosed. Ost Cx to Prox Cx lesion, 50 %stenosed. LM lesion, 30 %stenosed. Prox LAD to Mid LAD lesion, 100 %stenosed. The left ventricular ejection fraction is less than 25% by visual estimate. LV end diastolic pressure is normal. There is severe left ventricular systolic dysfunction.   Severe LV dysfunction with an ejection fraction of 20-25%. There was akinesis of the distal inferoapical segment with significant hypokinesis globally c/w an ischemic cardiomyopathy.   Severe multivessel CAD with 30% near ostial stenosis, 75% proximal, and total occlusion of a large LAD with distal collateralization; 50% proximal left circumflex stenoses with total occlusion of the distal circumflex; and chronic total occlusion of the mid RCA with collateralization distally via the RV marginal branch as well as via the left coronary injection.   RECOMMENDATION: Surgical consultation for CABG revascularization.     Laboratory Data: High Sensitivity Troponin:   No results for input(s): TROPONINIHS in the last 720 hours. No results for input(s): TRNPT in the last 720 hours.    Chemistry Recent Labs  Lab 02/07/24 1534 02/08/24 0915  NA 139 139  K 4.0 3.8  CL 100 100  CO2 30 28  GLUCOSE 108* 114*  BUN 26* 27*  CREATININE 1.08 1.21  CALCIUM  9.6 9.5  GFRNONAA  --  >60  ANIONGAP  --  11    Recent Labs  Lab 02/08/24 0915  PROT 6.0*  ALBUMIN  4.0  AST 31  ALT 19  ALKPHOS 79  BILITOT 1.1   Lipids No results for input(s): CHOL, TRIG, HDL, LABVLDL, LDLCALC, CHOLHDL in the last 168 hours.  Hematology Recent Labs  Lab 02/08/24 0915  WBC 6.1  RBC 4.70  HGB 15.2  HCT 46.2  MCV 98.3  MCH 32.3  MCHC 32.9  RDW 15.2  PLT 122*   Thyroid  No results for input(s): TSH, FREET4 in the last 168 hours.  BNP Recent Labs  Lab 02/08/24 0915  PROBNP 32,929.0*    DDimer  Recent Labs  Lab 02/08/24 0926  DDIMER 3.94*    Radiology/Studies:  CT Angio Chest PE W and/or Wo Contrast Result Date: 02/08/2024 CLINICAL DATA:  Shortness of breath EXAM: CT ANGIOGRAPHY CHEST WITH CONTRAST TECHNIQUE: Multidetector CT imaging of the chest was performed using the standard protocol during bolus administration of intravenous contrast. Multiplanar CT image reconstructions and MIPs were obtained to evaluate the vascular anatomy. RADIATION DOSE REDUCTION: This exam was performed according to the departmental dose-optimization program which includes automated exposure control, adjustment of the mA and/or kV according to patient size and/or use of iterative reconstruction technique. CONTRAST:  75mL OMNIPAQUE  IOHEXOL  350 MG/ML SOLN COMPARISON:  Radiograph of same day FINDINGS: Cardiovascular: Mild cardiomegaly is noted. Coronary artery calcifications are noted. No pericardial effusion. There is no definite evidence of large central pulmonary embolus seen in the main pulmonary artery or the main portions of the left and right pulmonary arteries. There is  seen some degree of scatter artifact in the SVC which limits evaluation of the right pulmonary artery. There are noted multiple filling defects in multiple lower lobe branches of both pulmonary arteries, but it is difficult to determine if these actually represent pulmonary emboli or potentially artifact secondary to non opacification of the vessels due to timing of the contrast bolus. Mediastinum/Nodes: No enlarged mediastinal, hilar, or axillary lymph nodes. Thyroid  gland, trachea, and esophagus demonstrate no significant findings. Lungs/Pleura: Bilateral pleural effusions are noted, right greater than left. No pneumothorax is noted. Probable emphysematous disease is noted.  Biapical scarring is noted. 7 mm nodule is noted posteriorly in right upper lobe best seen on image number 60 of series 6. Minimal bibasilar subsegmental atelectasis is noted. Irregular opacity is noted in left lung apex most consistent with pneumonia. Upper Abdomen: No acute abnormality. Musculoskeletal: No chest wall abnormality. No acute or significant osseous findings. Review of the MIP images confirms the above findings. IMPRESSION: 1. There is no definite evidence of large central pulmonary embolus seen in the main pulmonary artery or main portions of the left and right pulmonary arteries. However, there are noted multiple filling defects in multiple lower lobe branches of both pulmonary arteries, but it is difficult to determine if these actually represent pulmonary emboli or potentially artifact secondary to non opacification of the vessels due to timing of the contrast bolus. Repeat CT pulmonary arteriogram is recommended for further evaluation if patient's renal function allows. 2. Bilateral pleural effusions are noted, right greater than left. 3. 7 mm nodule is noted posteriorly in right upper lobe. Non-contrast chest CT at 6-12 months is recommended. If the nodule is stable at time of repeat CT, then future CT at 18-24 months (from  today's scan) is considered optional for low-risk patients, but is recommended for high-risk patients. This recommendation follows the consensus statement: Guidelines for Management of Incidental Pulmonary Nodules Detected on CT Images: From the Fleischner Society 2017; Radiology 2017; 284:228-243. 4. Irregular opacity is noted in left lung apex most consistent with pneumonia. 5. Coronary artery calcifications are noted. 6. Emphysema. Emphysema (ICD10-J43.9). Electronically Signed   By: Daniel Landy Raddle M.D.   On: 02/08/2024 11:00   DG Chest 2 View Result Date: 02/08/2024 CLINICAL DATA:  Dyspnea on exertion, bilateral lower extremity edema. EXAM: CHEST - 2 VIEW COMPARISON:  January 30, 2024 FINDINGS: Stable cardiomegaly. Status post coronary artery bypass graft. Stable left-sided defibrillator. Left lung is clear. Minimally increased right basilar opacity is noted concerning for slightly increased subsegmental atelectasis, asymmetric edema or possibly inflammation. Minimal right pleural effusion may be present. Bony thorax is unremarkable. IMPRESSION: Minimally increased right basilar opacity is noted concerning for slightly increased subsegmental atelectasis, asymmetric edema or possibly inflammation. Minimal right pleural effusion may be present. Electronically Signed   By: Daniel Landy Raddle M.D.   On: 02/08/2024 09:44   DG Chest 1 View Result Date: 02/08/2024 CLINICAL DATA:  Shortness of breath EXAM: CHEST  1 VIEW COMPARISON:  February 07, 2024 FINDINGS: Stable cardiomegaly. Status post coronary bypass graft. Left-sided defibrillator is unchanged. Left lung is clear. Increased right basilar opacity is noted concerning for atelectasis or infiltrate. Bony thorax is unremarkable. IMPRESSION: Increased right basilar opacity is noted concerning for atelectasis or infiltrate. Electronically Signed   By: Daniel Landy Raddle M.D.   On: 02/08/2024 09:42     Assessment and Plan:  Acute heart failure with reduced EF   Ischemic cardiomyopathy AICD in situ - Echo 10/2023 with LVEF 20%, mod reduced RV, mod LAE and RAE, IVC dilated  - proBNP 32929 - Chest imaging with CHF - he has no signs suggestive of pneumonia, consider check procal and monitor fever - requested device interrogation at ER, pending - continue IV Lasix  40mg  BID, on PTA lasix  40mg  daily  - trend daily weight, I&O, electrolytes  - GDMT: historically difficult due to various reason such as hypotension, bradycardia, hyperkalemia, ? Allergy, etc. Tolerating benazepril  20mg  BID and farxiga  10mg , complaint, will continue    CAD status post CABG 2018 - no chest pain  -  continue ASA 81mg  daily  - statin and beta blocker intolerant    Risk Assessment/Risk Scores:  [place]  [employer] Association (NYHA) Functional Class NYHA Class III       For questions or updates, please contact [facility] Please consult [email or web address] for contact info under      Signed, Xika Zhao, NP  02/08/2024 12:36 PM     [1]  Allergies Allergen Reactions   Entresto  [Sacubitril -Valsartan ] Hives and Shortness Of Breath   Amlodipine  Swelling    LE swelling   Contrast Media [Iodinated Contrast Media] Other (See Comments)    Prostate problem had to wear a catheter for two weeks after having dye.    Hydralazine  Palpitations and Other (See Comments)    Fatigue+   Carvedilol  Other (See Comments)    Bradycardia   Spironolactone  Other (See Comments)    Hyperkalemia    "

## 2024-02-08 NOTE — H&P (Signed)
 " History and Physical    Patient: Daniel [name]. FMW:[id number] DOB: 08-01-1945 DOA: 02/08/2024 DOS: the patient was seen and examined on 02/08/2024 . PCP: Candise Aleene DEL, MD  Patient coming from: Home Chief complaint: Chief Complaint  Patient presents with   Shortness of Breath   HPI:  Daniel [name]. is a 79 y.o. male with past medical history  of  HTN, HLD, coronary artery disease, history of NSTEMI, congestive heart failure with EF of 20%, pacemaker implantation patient coming in for shortness of breath along with lower extremity edema has been getting worse over the past 2 days.  States he recently had a dual-chamber ICD/pacemaker placed with Successful removal of the previously implanted dual-chamber ICD,insertion of a new coronary sinus pacing lead and insertion of a new biventricular ICD in a patient with pacing induced left bundle branch block and chronic systolic heart failure on 01/08/2024.  At bedside patient is in no distress is able to give history complete sentences does not report any other complaints or symptoms palpitation dizziness syncope bleeding.  Patient states he does not have a whole lot of salt and has almost quit smoking and is compliant with his medications.   ED Course:  Vital signs in the ED were notable for the following:  Vitals:   02/08/24 1516 02/08/24 1645 02/08/24 1715 02/08/24 1724  BP: (!) 153/95 (!) 146/106 132/83   Pulse: 66 62 64   Temp: (!) 97.5 F (36.4 C)   (!) 97.5 F (36.4 C)  Resp: 16 14 (!) 25   Height:      Weight:      SpO2: 98% 98% 96%   TempSrc: Oral   Oral  BMI (Calculated):       >>ED evaluation thus far shows: CMP shows glucose 114 BUN 27 kidney function electrolytes and LFTs otherwise normal. proBNP of 32 929.0, initial troponin of 60 3 repeat at 56.  EKG is atrial sensed ventricular paced rhythm with a heart rate of 72 PR 196 QTc of 512. CBC shows normal white count of 6.1 hemoglobin 15.2 and platelets 122. Patient had  chest x-ray in the ED which was concerning for right basilar opacity and was given doxycycline  and Rocephin , CT chest angio PE protocol was suboptimal due to contrast timing was negative for large central PE but noted multiple filling defects in multiple lower lobe branches of both pulmonary artery with recommendation for repeat CT angio no mention of pneumonia consolidation.  >>While in the ED patient received the following: Medications  cefTRIAXone  (ROCEPHIN ) 1 g in sodium chloride  0.9 % 100 mL IVPB (1 g Intravenous New Bag/Given 02/08/24 1141)  doxycycline  (VIBRA -TABS) tablet 100 mg (has no administration in time range)  iohexol  (OMNIPAQUE ) 350 MG/ML injection 75 mL (75 mLs Intravenous Contrast Given 02/08/24 1033)  furosemide  (LASIX ) injection 40 mg (40 mg Intravenous Given 02/08/24 1050)   Review of Systems  Respiratory:  Positive for shortness of breath.   Cardiovascular:  Positive for leg swelling.  All other systems reviewed and are negative.  Past Medical History:  Diagnosis Date   Abdominal aortic aneurysm 01/2021   4 cm, infrarenal.  Also enlarged left common iliac artery--vascular eval 03/2021->rpt aortic u/s 06/01/22 stable Aorta 4.2 cm.  Bilat common iliac aneurisms and left common femoral aneurism-->f/u 1 yr   Acute prostatitis 06/19/2013   Anxiety    Atrial fibrillation (HCC)    Limited episode, emergency room, June, 2013, spontaneous conversion to sinus rhythm, was evaluated  By Dr. Micky 2013- no further visits.  Post-op (CABG) a-fib, converted on amio but pt self d/c'd this med due to DOE/side effect profile.   Blurred vision 01/01/2020   Bradycardia 06/20/2017   CAD (coronary artery disease) 05/2016   a. 05/2016: NSTEMI with cath showing 3-vessel disease. Underwent CABG on 05/12/16 with LIMA-D1, SVG-LAD, and SVG-PDA.    COPD (chronic obstructive pulmonary disease) (HCC) fall 2016   Bullous changes noted on lower lung images of CT abd done by urology   Diverticulitis    Double  vision 01/2020   MG ab w/u NEG.  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 Unable to have MRI due to having shrapnel in forehead.   Hyperlipidemia    statin intolerant. Pt declined advanced lipid clinic referral 08/2019, 03/2020, and 01/2021.   Hypertension    Ischemic cardiomyopathy 05/2016   EF 20-25% at the time of NSTEMI/CABG.  Repeat EF 07/2016 30-35%.  07/2017 EF 25-30%, pt intol of entresto , bidil, and BB--for ICD 03/2019.   Microscopic hematuria Fall 2016   CT nl except nonobstructing stones.  Cystoscopy normal 12/30/14 (Dr. 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  Urol (Dr. Beola) at [facility]; Dr. Beola referred him to Dr. Renda 03/2015: patient got bilat nerve sparing , robot assisted laparoscopic radical prostatectomy and pelvic lymphadenectomy.  Urol f/u, PSA surveillance showing very mild uptrend of PSA but not to the level of biochemical recurrence as of 05/2021 urology follow-up   RLS (restless legs syndrome)    Statin intolerance    Tobacco dependence    down to 1-2 cigs per day as of 11/2015.  Restarted 08/2016.   Urinary retention 05/2015   occurred s/p foley removal   UTI (urinary tract infection) 11/14/2014   Past Surgical History:  Procedure Laterality Date   aortic ultrasound  07/2012   2014 NO AAA.  01/2021-->4 cm AAA with R common iliac ectasia-->vasc surg ref   BIV ICD INSERTION CRT-D     01/30/24   BIV ICD INSERTION CRT-D N/A  01/30/2024   Procedure: BIV ICD INSERTION CRT-D;  Surgeon: Waddell Danelle ORN, MD;  Location: [facility] INVASIVE CV LAB;  Service: Cardiovascular;  Laterality: N/A;   COLONOSCOPY  approx 2011 per pt   Done by surgeon in [place] after a bout of diverticulitis; normal per pt report--was told to repeat in 10 yrs.   CORONARY ARTERY BYPASS GRAFT N/A 05/12/2016   Procedure: CORONARY ARTERY BYPASS GRAFTING (CABG) TIMES THREE USING LEFT INTERNAL MAMMARY ARTERY AND RIGHT SAPHENOUS LEG VEIN HARVESTED ENDOSCOPICALLY;  Surgeon: Elspeth JAYSON Millers, MD;  Location: [facility] OR;  Service: Open Heart Surgery;  Laterality: N/A;   ICD IMPLANT N/A 04/12/2019   Procedure: ICD IMPLANT;  Surgeon: Waddell Danelle ORN, MD;  Location: [facility] INVASIVE CV LAB;  Service: Cardiovascular;  Laterality: N/A;   INGUINAL HERNIA REPAIR  2003 and 2004   Both sides.   LAPAROSCOPY  2017  LEFT HEART CATH AND CORONARY ANGIOGRAPHY N/A 05/09/2016   Procedure: Left Heart Cath and Coronary Angiography;  Surgeon: Debby DELENA Sor, MD;  Location: [facility] INVASIVE CV LAB;  Service: Cardiovascular;  Laterality: N/A;   LITHOTRIPSY  2004   Dr. Verdene in [place].   LYMPHADENECTOMY Bilateral 04/30/2015   Procedure: PELVIC LYMPHADENECTOMY;  Surgeon: Gretel Ferrara, MD;  Location: [facility] ORS;  Service: Urology;  Laterality: Bilateral;   PROSTATE BIOPSY  02/05/2015   Prostate adenocarcinoma: pt considering treatment options as of 02/19/15   ROBOT ASSISTED LAPAROSCOPIC RADICAL PROSTATECTOMY N/A 04/30/2015   Procedure: XI ROBOTIC ASSISTED LAPAROSCOPIC RADICAL PROSTATECTOMY LEVEL 2;  Surgeon: Gretel Ferrara, MD;  Location: [facility] ORS;  Service: Urology;  Laterality: N/A;   TEE WITHOUT CARDIOVERSION N/A 05/12/2016   Procedure: TRANSESOPHAGEAL ECHOCARDIOGRAM (TEE);  Surgeon: Elspeth JAYSON Millers, MD;  Location: [facility] OR;  Service: Open Heart Surgery;  Laterality: N/A;   TRANSTHORACIC ECHOCARDIOGRAM  03/22/2006; 05/2016; 07/2016; 07/2017; 01/02/20   2008: EF 65%, normal valves, no wall motion  abnormalties, LA size normal.  05/2016 in context of non-STEMI, EF 20-25%, mild AV and MV regurg.  08/05/16: EF 30-35%, akinesis of mid lateral myoc, grd II DD, mild aortic 07/2017: EF 25-30%, mult areas of akinesis, grd I DD. 01/2020 EF 25-30%, sev LV dsfxn, valves fine, no WMA.  10/2023 EF 20%, dec LV and RV fx, ele PA press, mod/sev MR    reports that he has been smoking cigarettes. He has a 55 pack-year smoking history. He has never used smokeless tobacco. He reports that he does not drink alcohol and does not use drugs. Allergies[1] Family History  Problem Relation Age of Onset   Hypertension Mother    Cancer - Other Mother 62       breast   Cancer Father        Lung ca age 69   Diabetes Sister    Prior to Admission medications  Medication Sig Start Date End Date Taking? Authorizing Provider  albuterol  (VENTOLIN  HFA) 108 (90 Base) MCG/ACT inhaler Inhale 1-2 puffs into the lungs every 6 (six) hours as needed for wheezing or shortness of breath. 10/25/23   Ricky Fines, MD  ALPRAZolam  (XANAX ) 0.25 MG tablet tAKE 1 to 2 tabLETS BY MOUTH AT BEDTIME AS NEEDED FOR insomnia/restless legs 10/18/23   McGowen, Aleene DEL, MD  aspirin  EC 81 MG tablet Take 1 tablet (81 mg total) by mouth daily. Swallow whole. 02/05/20   Lavona Agent, MD  benazepril  (LOTENSIN ) 20 MG tablet TAKE ONE TABLET BY MOUTH TWICE DAILY 01/16/24   McGowen, Aleene DEL, MD  dapagliflozin  propanediol (FARXIGA ) 10 MG TABS tablet Take 10 mg by mouth daily.    [provider]  furosemide  (LASIX ) 20 MG tablet Take 1 tablet (20 mg total) by mouth daily. Patient taking differently: Take 20 mg by mouth 2 (two) times daily. Tid x 4 d 11/16/23   Glena Harlene HERO, FNP  loratadine  (CLARITIN ) 10 MG tablet Take 1 tablet (10 mg total) by mouth daily. 01/04/24   Sherlynn Madden, MD  omeprazole (PRILOSEC) 20 MG capsule Take 20 mg by mouth daily as needed (for acid reflux). 07/31/23   [provider]  Cetirizine  HCl (ZYRTEC   ALLERGY) 10 MG CAPS Take 1 capsule (10 mg total) by mouth daily. 11/06/14 11/06/14  Lenor Hollering, MD  Vitals:   02/08/24 1516 02/08/24 1645 02/08/24 1715 02/08/24 1724  BP: (!) 153/95 (!) 146/106 132/83   Pulse: 66 62 64   Resp: 16 14 (!) 25   Temp: (!) 97.5 F (36.4 C)   (!) 97.5 F (36.4 C)  TempSrc: Oral   Oral  SpO2: 98% 98% 96%   Weight:      Height:       Physical Exam Vitals reviewed.  Constitutional:      General: He is not in acute distress.    Appearance: He is not ill-appearing.  HENT:     Head: Normocephalic.  Eyes:     Extraocular Movements: Extraocular movements intact.  Cardiovascular:     Rate and Rhythm: Normal rate and regular rhythm.     Pulses: Normal pulses.     Heart sounds: Normal heart sounds.  Pulmonary:     Breath sounds: Examination of the right-middle field reveals rales. Examination of the left-middle field reveals rales. Examination of the right-lower field reveals rales. Examination of the left-lower field reveals rales. Rales present.  Abdominal:     General: There is no distension.     Palpations: Abdomen is soft.     Tenderness: There is no abdominal tenderness.  Musculoskeletal:     Right lower leg: Edema present.     Left lower leg: Edema present.  Neurological:     General: No focal deficit present.     Mental Status: He is alert and oriented to person, place, and time.     Labs on Admission: I have personally reviewed following labs and imaging studies CBC: Recent Labs  Lab 02/08/24 0915 02/08/24 1253  WBC 6.1 5.3  HGB 15.2 15.4  HCT 46.2 47.8  MCV 98.3 100.2*  PLT 122* 121*   Basic Metabolic Panel: Recent Labs  Lab 02/07/24 1534 02/08/24 0915 02/08/24 1253  NA 139 139  --   K 4.0 3.8  --   CL 100 100  --   CO2 30 28  --   GLUCOSE 108* 114*  --   BUN 26* 27*  --   CREATININE 1.08 1.21  --   CALCIUM  9.6 9.5  --   MG  --   --  2.4    GFR: Estimated Creatinine Clearance: 49 mL/min (by C-G formula based on SCr of 1.21 mg/dL). Liver Function Tests: Recent Labs  Lab 02/08/24 0915  AST 31  ALT 19  ALKPHOS 79  BILITOT 1.1  PROT 6.0*  ALBUMIN  4.0   No results for input(s): LIPASE, AMYLASE in the last 168 hours. No results for input(s): AMMONIA in the last 168 hours. Recent Labs    10/24/23 0440 10/25/23 0439 11/03/23 1104 11/14/23 1450 11/22/23 1407 11/30/23 1013 01/04/24 1306 01/10/24 1034 02/07/24 1534 02/08/24 0915  BUN 27* 28* 23 22 22 19 25 21  26* 27*  CREATININE 1.01 0.91 1.08 1.67* 1.15 1.01 1.11 1.10 1.08 1.21    Cardiac Enzymes: No results for input(s): CKTOTAL, CKMB, CKMBINDEX, TROPONINI in the last 168 hours. BNP (last 3 results) Recent Labs    11/22/23 1407 01/04/24 1306 02/08/24 0915  PROBNP 14,804.0* 4,885.0* 32,929.0*   HbA1C: No results for input(s): HGBA1C in the last 72 hours. CBG: No results for input(s): GLUCAP in the last 168 hours. Lipid Profile: No results for input(s): CHOL, HDL, LDLCALC, TRIG, CHOLHDL, LDLDIRECT in the last 72 hours. Thyroid  Function Tests: No results for input(s): TSH, T4TOTAL, FREET4, T3FREE, THYROIDAB in the last 72 hours. Anemia  Panel: No results for input(s): VITAMINB12, FOLATE, FERRITIN, TIBC, IRON, RETICCTPCT in the last 72 hours. Urine analysis:    Component Value Date/Time   COLORURINE YELLOW 08/16/2022 1033   APPEARANCEUR Sl Cloudy (A) 08/16/2022 1033   LABSPEC 1.020 08/16/2022 1033   PHURINE 5.5 08/16/2022 1033   GLUCOSEU 500 (A) 08/16/2022 1033   HGBUR TRACE-LYSED (A) 08/16/2022 1033   BILIRUBINUR negative 08/16/2022 1041   BILIRUBINUR NEGATIVE 08/16/2022 1033   KETONESUR NEGATIVE 08/16/2022 1033   PROTEINUR Negative 08/16/2022 1041   PROTEINUR (A) 05/07/2016 1204    TEST NOT REPORTED DUE TO COLOR INTERFERENCE OF URINE PIGMENT   UROBILINOGEN 0.2 08/16/2022 1041   UROBILINOGEN  0.2 08/16/2022 1033   NITRITE Negative 08/16/2022 1041   NITRITE NEGATIVE 08/16/2022 1033   LEUKOCYTESUR Negative 08/16/2022 1041   LEUKOCYTESUR NEGATIVE 08/16/2022 1033   Radiological Exams on Admission: CT Angio Chest PE W and/or Wo Contrast Result Date: 02/08/2024 CLINICAL DATA:  Shortness of breath EXAM: CT ANGIOGRAPHY CHEST WITH CONTRAST TECHNIQUE: Multidetector CT imaging of the chest was performed using the standard protocol during bolus administration of intravenous contrast. Multiplanar CT image reconstructions and MIPs were obtained to evaluate the vascular anatomy. RADIATION DOSE REDUCTION: This exam was performed according to the departmental dose-optimization program which includes automated exposure control, adjustment of the mA and/or kV according to patient size and/or use of iterative reconstruction technique. CONTRAST:  75mL OMNIPAQUE  IOHEXOL  350 MG/ML SOLN COMPARISON:  Radiograph of same day FINDINGS: Cardiovascular: Mild cardiomegaly is noted. Coronary artery calcifications are noted. No pericardial effusion. There is no definite evidence of large central pulmonary embolus seen in the main pulmonary artery or the main portions of the left and right pulmonary arteries. There is seen some degree of scatter artifact in the SVC which limits evaluation of the right pulmonary artery. There are noted multiple filling defects in multiple lower lobe branches of both pulmonary arteries, but it is difficult to determine if these actually represent pulmonary emboli or potentially artifact secondary to non opacification of the vessels due to timing of the contrast bolus. Mediastinum/Nodes: No enlarged mediastinal, hilar, or axillary lymph nodes. Thyroid  gland, trachea, and esophagus demonstrate no significant findings. Lungs/Pleura: Bilateral pleural effusions are noted, right greater than left. No pneumothorax is noted. Probable emphysematous disease is noted. Biapical scarring is noted. 7 mm nodule  is noted posteriorly in right upper lobe best seen on image number 60 of series 6. Minimal bibasilar subsegmental atelectasis is noted. Irregular opacity is noted in left lung apex most consistent with pneumonia. Upper Abdomen: No acute abnormality. Musculoskeletal: No chest wall abnormality. No acute or significant osseous findings. Review of the MIP images confirms the above findings. IMPRESSION: 1. There is no definite evidence of large central pulmonary embolus seen in the main pulmonary artery or main portions of the left and right pulmonary arteries. However, there are noted multiple filling defects in multiple lower lobe branches of both pulmonary arteries, but it is difficult to determine if these actually represent pulmonary emboli or potentially artifact secondary to non opacification of the vessels due to timing of the contrast bolus. Repeat CT pulmonary arteriogram is recommended for further evaluation if patient's renal function allows. 2. Bilateral pleural effusions are noted, right greater than left. 3. 7 mm nodule is noted posteriorly in right upper lobe. Non-contrast chest CT at 6-12 months is recommended. If the nodule is stable at time of repeat CT, then future CT at 18-24 months (from today's scan) is considered optional for  low-risk patients, but is recommended for high-risk patients. This recommendation follows the consensus statement: Guidelines for Management of Incidental Pulmonary Nodules Detected on CT Images: From the Fleischner Society 2017; Radiology 2017; 284:228-243. 4. Irregular opacity is noted in left lung apex most consistent with pneumonia. 5. Coronary artery calcifications are noted. 6. Emphysema. Emphysema (ICD10-J43.9). Electronically Signed   By: Lynwood Landy Raddle M.D.   On: 02/08/2024 11:00   DG Chest 2 View Result Date: 02/08/2024 CLINICAL DATA:  Dyspnea on exertion, bilateral lower extremity edema. EXAM: CHEST - 2 VIEW COMPARISON:  January 30, 2024 FINDINGS: Stable  cardiomegaly. Status post coronary artery bypass graft. Stable left-sided defibrillator. Left lung is clear. Minimally increased right basilar opacity is noted concerning for slightly increased subsegmental atelectasis, asymmetric edema or possibly inflammation. Minimal right pleural effusion may be present. Bony thorax is unremarkable. IMPRESSION: Minimally increased right basilar opacity is noted concerning for slightly increased subsegmental atelectasis, asymmetric edema or possibly inflammation. Minimal right pleural effusion may be present. Electronically Signed   By: Lynwood Landy Raddle M.D.   On: 02/08/2024 09:44   DG Chest 1 View Result Date: 02/08/2024 CLINICAL DATA:  Shortness of breath EXAM: CHEST  1 VIEW COMPARISON:  February 07, 2024 FINDINGS: Stable cardiomegaly. Status post coronary bypass graft. Left-sided defibrillator is unchanged. Left lung is clear. Increased right basilar opacity is noted concerning for atelectasis or infiltrate. Bony thorax is unremarkable. IMPRESSION: Increased right basilar opacity is noted concerning for atelectasis or infiltrate. Electronically Signed   By: Lynwood Landy Raddle M.D.   On: 02/08/2024 09:42   Data Reviewed: Relevant notes from primary care and specialist visits, past discharge summaries as available in EHR, including Care Everywhere . Prior diagnostic testing as pertinent to current admission diagnoses, Updated medications and problem lists for reconciliation .ED course, including vitals, labs, imaging, treatment and response to treatment,Triage notes, nursing and pharmacy notes and ED provider's notes.Notable results as noted in HPI.Discussed case with EDMD/ ED APP/ or Specialty MD on call and as needed.  Assessment & Plan  >> Acute systolic congestive heart failure ischemic cardiomyopathy/AICD: Based on his most recent echo in 2025 September patient has a EF of 20% ICD insertion 5 years ago ,current proBNP is 39 929 with clinical exam showing rales diffusely  on auscultation no JVD cardiology note patient has an AICD interrogation has been requested recommend continuation, Lasix  with trending strict I's and O's and daily weights.  Additional GDMT per cardiology recommendation.   >> CAD status post CABG: Continue patient on aspirin  and statin therapy.   >> Contrast allergy:  Chart allergy list shows patient is allergic to Entresto  amlodipine  contrast iodinated,however pt has tolerated CTA today.  >>BPH: Strict I's and O's.  As needed bladder scans.   >>Tobacco abuse: Nicotine  patch. Smoking cessation counseling once stable.   DVT prophylaxis:  Heparin  q12h due to low platelet count.   Consults:  Cardiology .  Advance Care Planning:    Code Status: Full Code   Family Communication:  None.  Disposition Plan:  Home.  Severity of Illness: The appropriate patient status for this patient is INPATIENT. Inpatient status is judged to be reasonable and necessary in order to provide the required intensity of service to ensure the patient's safety. [name] presenting symptoms, physical exam findings, and initial radiographic and laboratory data in the context of their chronic comorbidities is felt to place them at high risk for further clinical deterioration. Furthermore, it is not anticipated that the patient will  be medically stable for discharge from the hospital within 2 midnights of admission.   * I certify that at the point of admission it is my clinical judgment that the patient will require inpatient hospital care spanning beyond 2 midnights from the point of admission due to high intensity of service, high risk for further deterioration and high frequency of surveillance required.*  Unresulted Labs (From admission, onward)     Start     Ordered   02/09/24 0500  Comprehensive metabolic panel  Tomorrow morning,   R        02/08/24 1223   02/09/24 0500  CBC  Tomorrow morning,   R        02/08/24 1223   02/09/24 0500  Magnesium    Tomorrow morning,   R        02/08/24 1223   02/08/24 1854  Procalcitonin  Add-on,   AD        02/08/24 1853   02/08/24 1104  Blood culture (routine x 2)  BLOOD CULTURE X 2,   R      02/08/24 1104            Meds ordered this encounter  Medications   iohexol  (OMNIPAQUE ) 350 MG/ML injection 75 mL   furosemide  (LASIX ) injection 40 mg   cefTRIAXone  (ROCEPHIN ) 1 g in sodium chloride  0.9 % 100 mL IVPB    Antibiotic Indication::   CAP   DISCONTD: azithromycin  (ZITHROMAX ) 500 mg in sodium chloride  0.9 % 250 mL IVPB    Antibiotic Indication::   CAP   doxycycline  (VIBRA -TABS) tablet 100 mg   albuterol  (PROVENTIL ) (2.5 MG/3ML) 0.083% nebulizer solution 3 mL   aspirin  EC tablet 81 mg   furosemide  (LASIX ) injection 40 mg   sodium chloride  flush (NS) 0.9 % injection 3 mL   OR Linked Order Group    acetaminophen  (TYLENOL ) tablet 650 mg    acetaminophen  (TYLENOL ) suppository 650 mg   polyethylene glycol (MIRALAX  / GLYCOLAX ) packet 17 g   bisacodyl  (DULCOLAX) EC tablet 5 mg   OR Linked Order Group    ondansetron  (ZOFRAN ) tablet 4 mg    ondansetron  (ZOFRAN ) injection 4 mg   DISCONTD: heparin  injection 5,000 Units   DISCONTD: cefTRIAXone  (ROCEPHIN ) 1 g in sodium chloride  0.9 % 100 mL IVPB    Antibiotic Indication::   CAP   DISCONTD: doxycycline  (VIBRA -TABS) tablet 100 mg   heparin  injection 5,000 Units   nicotine  (NICODERM CQ  - dosed in mg/24 hours) patch 21 mg     Orders Placed This Encounter  Procedures   Blood culture (routine x 2)   DG Chest 1 View   CT Angio Chest PE W and/or Wo Contrast   Comprehensive metabolic panel   CBC   Pro Brain natriuretic peptide   D-dimer, quantitative   CBC   Magnesium    Comprehensive metabolic panel   CBC   Magnesium    Procalcitonin   Diet 2 gram sodium Room service appropriate? Yes; Fluid consistency: Thin   ED Cardiac monitoring   Check Peak Flow   Initiate Carrier Fluid Protocol   Notify physician (specify)   Initiate Heart Failure  Care Plan   Daily weights   Strict intake and output   In and Out Cath   Patient Education:   Apply Heart Failure Care Plan   [facility] and [facility] only) Obtain REDS clips reading Every morning   Cardiac Monitoring Continuous x 24 hours Indications for use: Sub-acute heart failure   Maintain IV  access   Vital signs   Notify physician (specify)   Mobility Protocol: No Restrictions   Refer to Sidebar Report Mobility Protocol for Adult Inpatient   Initiate Adult Central Line Maintenance and Catheter Clearance Protocol for patients with central line (CVC, PICC, Port, Hemodialysis, Trialysis)   If patient diabetic or glucose greater than 140 notify physician for Sliding Scale Insulin  Orders   Initiate CHG Protocol for patients in ICU/SD or any patient with a central line or foley catheter   Do not place and if present remove PureWick   Initiate Oral Care Protocol   Initiate Carrier Fluid Protocol   RN may order General Admission PRN Orders utilizing General Admission PRN medications (through manage orders) for the following patient needs: allergy symptoms (Claritin ), cold sores (Carmex), cough (Robitussin DM), eye irritation (Liquifilm Tears), hemorrhoids (Tucks), indigestion (Maalox), minor skin irritation (Hydrocortisone Cream), muscle pain Lucienne Gay), nose irritation (saline nasal spray) and sore throat (Chloraseptic spray).   Ambulate with assistance   Full code   Consult to family practice  FM? Dr Candise   Consult to hospitalist   Inpatient consult to Cardiology   Inpatient consult to Cardiology Consult Timeframe: ROUTINE - requires response within 24 hours; Reason for Consult? CHF Already called   Consult to Transition of Care Team   Consult to Heart Failure Navigation Team [facility], [facility], and [facility])   Nutritional services consult   OT eval and treat   PT eval and treat   ED Pulse oximetry, continuous   Pulse oximetry check with vital signs   Oxygen therapy Mode or (Route): Nasal cannula; Liters  Per Minute: 2; Keep O2 saturation between: greater than 92 %   Incentive spirometry   ED EKG   EKG 12-Lead   Saline lock IV   Insert peripheral IV   Admit to Inpatient (patient's expected length of stay will be greater than 2 midnights or inpatient only procedure)   Aspiration precautions   Fall precautions    Author: Mario LULLA Blanch, MD 12 pm- 8 pm. [facility]. 02/08/2024 7:01 PM Please note for any communication after hours contact [facility] Assigned provider on call on Amion.       [1]  Allergies Allergen Reactions   Entresto  [Sacubitril -Valsartan ] Hives and Shortness Of Breath   Amlodipine  Swelling    LE swelling   Contrast Media [Iodinated Contrast Media] Other (See Comments)    Prostate problem had to wear a catheter for two weeks after having dye.    Hydralazine  Palpitations and Other (See Comments)    Fatigue+   Carvedilol  Other (See Comments)    Bradycardia   Spironolactone  Other (See Comments)    Hyperkalemia    "

## 2024-02-08 NOTE — Progress Notes (Signed)
 Heart Failure Navigator Progress Note  Assessed for Heart & Vascular TOC clinic readiness.  Patient does not meet criteria due to has a scheduled [facility] appointment on . 02/14/2024. No HF TOC   Navigator will sign off at this time.   Stephane Haddock, BSN, Scientist, Clinical (histocompatibility And Immunogenetics) Only

## 2024-02-08 NOTE — ED Notes (Signed)
 Patient on 2 liters O2 for comfort

## 2024-02-08 NOTE — ED Triage Notes (Signed)
 Pt arrives via [facility] from home c/o SOB. Pt had recently had pacemaker installed last week. Pt with noted bilateral lower extremity edema. Pt initially 90% on RA. Pt reportedly took 80 mg lasix  last night. Pt reports 8 lb of weight gain in one week.   EMS last VS 160/100, cbg 163, HR varible 46-85 (AV paced) 100% on 3 lpm O2 via [place].

## 2024-02-08 NOTE — ED Provider Notes (Signed)
 " [place] EMERGENCY DEPARTMENT AT [facility] Provider Note   CSN: [id number] Arrival date & time: 02/08/24  9157     Patient presents with: Shortness of Breath   Daniel [name]. is a 79 y.o. male.  With past medical history of HTN, HLD, coronary artery disease, history of NSTEMI, congestive heart failure with EF of 20%, pacemaker implantation recently comes to ED with complaint of worsening SOB including DOE and orthopnea for 1-2 days, worsening BLE edema. Reports weight gain of 8lbs, reports Lasix  at home is not helping. O2 dropping at rest, needing 2L [place], tried off of O2 here and desats to 89% RA at rest.  No fever, no cough.     Shortness of Breath      Prior to Admission medications  Medication Sig Start Date End Date Taking? Authorizing Provider  albuterol  (VENTOLIN  HFA) 108 (90 Base) MCG/ACT inhaler Inhale 1-2 puffs into the lungs every 6 (six) hours as needed for wheezing or shortness of breath. 10/25/23   Ricky Fines, MD  ALPRAZolam  (XANAX ) 0.25 MG tablet tAKE 1 to 2 tabLETS BY MOUTH AT BEDTIME AS NEEDED FOR insomnia/restless legs 10/18/23   McGowen, Aleene DEL, MD  aspirin  EC 81 MG tablet Take 1 tablet (81 mg total) by mouth daily. Swallow whole. 02/05/20   Lavona Lynwood, MD  benazepril  (LOTENSIN ) 20 MG tablet TAKE ONE TABLET BY MOUTH TWICE DAILY 01/16/24   McGowen, Aleene DEL, MD  dapagliflozin  propanediol (FARXIGA ) 10 MG TABS tablet Take 10 mg by mouth daily.    [provider]  furosemide  (LASIX ) 20 MG tablet Take 1 tablet (20 mg total) by mouth daily. Patient taking differently: Take 20 mg by mouth 2 (two) times daily. Tid x 4 d 11/16/23   Glena Harlene HERO, FNP  loratadine  (CLARITIN ) 10 MG tablet Take 1 tablet (10 mg total) by mouth daily. 01/04/24   Sherlynn Madden, MD  omeprazole (PRILOSEC) 20 MG capsule Take 20 mg by mouth daily as needed (for acid reflux). 07/31/23   [provider]  Cetirizine  HCl (ZYRTEC  ALLERGY) 10 MG CAPS Take 1  capsule (10 mg total) by mouth daily. 11/06/14 11/06/14  Lenor Hollering, MD    Allergies: Entresto  [sacubitril -valsartan ], Amlodipine , Contrast media [iodinated contrast media], Hydralazine , Carvedilol , and Spironolactone     Review of Systems  Respiratory:  Positive for shortness of breath.     Updated Vital Signs BP (!) 146/87   Pulse 66   Temp 98.2 F (36.8 C) (Oral)   Resp 17   Ht 5' 10 (1.778 m)   Wt 68.9 kg   SpO2 100%   BMI 21.81 kg/m   Physical Exam Vitals and nursing note reviewed.  Constitutional:      General: He is not in acute distress.    Appearance: He is not toxic-appearing.  HENT:     Head: Normocephalic and atraumatic.  Eyes:     General: No scleral icterus.    Conjunctiva/sclera: Conjunctivae normal.  Cardiovascular:     Rate and Rhythm: Normal rate and regular rhythm.     Pulses: Normal pulses.     Heart sounds: Normal heart sounds.  Pulmonary:     Effort: Pulmonary effort is normal. No respiratory distress.     Breath sounds: Rales present.  Abdominal:     General: Abdomen is flat. Bowel sounds are normal.     Palpations: Abdomen is soft.     Tenderness: There is no abdominal tenderness.  Musculoskeletal:  Right lower leg: Edema present.     Left lower leg: Edema present.  Skin:    General: Skin is warm and dry.     Findings: No lesion.  Neurological:     General: No focal deficit present.     Mental Status: He is alert and oriented to person, place, and time. Mental status is at baseline.     (all labs ordered are listed, but only abnormal results are displayed) Labs Reviewed  COMPREHENSIVE METABOLIC PANEL WITH GFR - Abnormal; Notable for the following components:      Result Value   Glucose, Bld 114 (*)    BUN 27 (*)    Total Protein 6.0 (*)    All other components within normal limits  CBC - Abnormal; Notable for the following components:   Platelets 122 (*)    All other components within normal limits  PRO BRAIN NATRIURETIC  PEPTIDE - Abnormal; Notable for the following components:   Pro Brain Natriuretic Peptide 32,929.0 (*)    All other components within normal limits  D-DIMER, QUANTITATIVE - Abnormal; Notable for the following components:   D-Dimer, Quant 3.94 (*)    All other components within normal limits  CULTURE, BLOOD (ROUTINE X 2)  CULTURE, BLOOD (ROUTINE X 2)  CBC  MAGNESIUM   TROPONIN T, HIGH SENSITIVITY    EKG: EKG Interpretation Date/Time:  Thursday February 08 2024 08:47:42 EST Ventricular Rate:  72 PR Interval:  196 QRS Duration:  178 QT Interval:  468 QTC Calculation: 512 R Axis:   251  Text Interpretation: Atrial-sensed ventricular-paced rhythm with occasional AV dual-paced complexes Biventricular pacemaker detected Abnormal ECG When compared with ECG of 30-Jan-2024 12:44, PREVIOUS ECG IS PRESENT Confirmed by Neysa Clap [phone]) on 02/08/2024 9:11:11 AM  Radiology: CT Angio Chest PE W and/or Wo Contrast Result Date: 02/08/2024 CLINICAL DATA:  Shortness of breath EXAM: CT ANGIOGRAPHY CHEST WITH CONTRAST TECHNIQUE: Multidetector CT imaging of the chest was performed using the standard protocol during bolus administration of intravenous contrast. Multiplanar CT image reconstructions and MIPs were obtained to evaluate the vascular anatomy. RADIATION DOSE REDUCTION: This exam was performed according to the departmental dose-optimization program which includes automated exposure control, adjustment of the mA and/or kV according to patient size and/or use of iterative reconstruction technique. CONTRAST:  75mL OMNIPAQUE  IOHEXOL  350 MG/ML SOLN COMPARISON:  Radiograph of same day FINDINGS: Cardiovascular: Mild cardiomegaly is noted. Coronary artery calcifications are noted. No pericardial effusion. There is no definite evidence of large central pulmonary embolus seen in the main pulmonary artery or the main portions of the left and right pulmonary arteries. There is seen some degree of scatter artifact in  the SVC which limits evaluation of the right pulmonary artery. There are noted multiple filling defects in multiple lower lobe branches of both pulmonary arteries, but it is difficult to determine if these actually represent pulmonary emboli or potentially artifact secondary to non opacification of the vessels due to timing of the contrast bolus. Mediastinum/Nodes: No enlarged mediastinal, hilar, or axillary lymph nodes. Thyroid  gland, trachea, and esophagus demonstrate no significant findings. Lungs/Pleura: Bilateral pleural effusions are noted, right greater than left. No pneumothorax is noted. Probable emphysematous disease is noted. Biapical scarring is noted. 7 mm nodule is noted posteriorly in right upper lobe best seen on image number 60 of series 6. Minimal bibasilar subsegmental atelectasis is noted. Irregular opacity is noted in left lung apex most consistent with pneumonia. Upper Abdomen: No acute abnormality. Musculoskeletal: No chest wall abnormality.  No acute or significant osseous findings. Review of the MIP images confirms the above findings. IMPRESSION: 1. There is no definite evidence of large central pulmonary embolus seen in the main pulmonary artery or main portions of the left and right pulmonary arteries. However, there are noted multiple filling defects in multiple lower lobe branches of both pulmonary arteries, but it is difficult to determine if these actually represent pulmonary emboli or potentially artifact secondary to non opacification of the vessels due to timing of the contrast bolus. Repeat CT pulmonary arteriogram is recommended for further evaluation if patient's renal function allows. 2. Bilateral pleural effusions are noted, right greater than left. 3. 7 mm nodule is noted posteriorly in right upper lobe. Non-contrast chest CT at 6-12 months is recommended. If the nodule is stable at time of repeat CT, then future CT at 18-24 months (from today's scan) is considered optional for  low-risk patients, but is recommended for high-risk patients. This recommendation follows the consensus statement: Guidelines for Management of Incidental Pulmonary Nodules Detected on CT Images: From the Fleischner Society 2017; Radiology 2017; 284:228-243. 4. Irregular opacity is noted in left lung apex most consistent with pneumonia. 5. Coronary artery calcifications are noted. 6. Emphysema. Emphysema (ICD10-J43.9). Electronically Signed   By: Lynwood Landy Raddle M.D.   On: 02/08/2024 11:00   DG Chest 2 View Result Date: 02/08/2024 CLINICAL DATA:  Dyspnea on exertion, bilateral lower extremity edema. EXAM: CHEST - 2 VIEW COMPARISON:  January 30, 2024 FINDINGS: Stable cardiomegaly. Status post coronary artery bypass graft. Stable left-sided defibrillator. Left lung is clear. Minimally increased right basilar opacity is noted concerning for slightly increased subsegmental atelectasis, asymmetric edema or possibly inflammation. Minimal right pleural effusion may be present. Bony thorax is unremarkable. IMPRESSION: Minimally increased right basilar opacity is noted concerning for slightly increased subsegmental atelectasis, asymmetric edema or possibly inflammation. Minimal right pleural effusion may be present. Electronically Signed   By: Lynwood Landy Raddle M.D.   On: 02/08/2024 09:44   DG Chest 1 View Result Date: 02/08/2024 CLINICAL DATA:  Shortness of breath EXAM: CHEST  1 VIEW COMPARISON:  February 07, 2024 FINDINGS: Stable cardiomegaly. Status post coronary bypass graft. Left-sided defibrillator is unchanged. Left lung is clear. Increased right basilar opacity is noted concerning for atelectasis or infiltrate. Bony thorax is unremarkable. IMPRESSION: Increased right basilar opacity is noted concerning for atelectasis or infiltrate. Electronically Signed   By: Lynwood Landy Raddle M.D.   On: 02/08/2024 09:42     Procedures   Medications Ordered in the ED  doxycycline  (VIBRA -TABS) tablet 100 mg (has no  administration in time range)  albuterol  (VENTOLIN  HFA) 108 (90 Base) MCG/ACT inhaler 1-2 puff (has no administration in time range)  aspirin  EC tablet 81 mg (has no administration in time range)  furosemide  (LASIX ) injection 40 mg (has no administration in time range)  sodium chloride  flush (NS) 0.9 % injection 3 mL (has no administration in time range)  acetaminophen  (TYLENOL ) tablet 650 mg (has no administration in time range)    Or  acetaminophen  (TYLENOL ) suppository 650 mg (has no administration in time range)  polyethylene glycol (MIRALAX  / GLYCOLAX ) packet 17 g (has no administration in time range)  bisacodyl  (DULCOLAX) EC tablet 5 mg (has no administration in time range)  ondansetron  (ZOFRAN ) tablet 4 mg (has no administration in time range)    Or  ondansetron  (ZOFRAN ) injection 4 mg (has no administration in time range)  heparin  injection 5,000 Units (has no administration in time  range)  iohexol  (OMNIPAQUE ) 350 MG/ML injection 75 mL (75 mLs Intravenous Contrast Given 02/08/24 1033)  furosemide  (LASIX ) injection 40 mg (40 mg Intravenous Given 02/08/24 1050)  cefTRIAXone  (ROCEPHIN ) 1 g in sodium chloride  0.9 % 100 mL IVPB (1 g Intravenous New Bag/Given 02/08/24 1141)    Clinical Course as of 02/08/24 1231  Thu Feb 08, 2024  1027 Of note patient has iodine  contrast allergy listed.  On chart review it does appear that he has tolerated multiple's contrasted scans since this allergy was noted.  His allergy that is noted states requiring Foley catheter for urinary retention after scan.  He has no noted hives shortness of breath or anaphylaxis noted. Will proceed with scan and obs symptoms closely.  [JB]  1036 D-Dimer, Quant(!): 3.94 [JB]  1036 Pro Brain Natriuretic Peptide(!): 32,929.0 [JB]    Clinical Course User Index [JB] Jemaine Prokop, Warren SAILOR, PA-C                                 Medical Decision Making Amount and/or Complexity of Data Reviewed Labs: ordered. Decision-making details  documented in ED Course. Radiology: ordered.  Risk Prescription drug management. Decision regarding hospitalization.   This patient presents to the ED for concern of shortness of breath, this involves an extensive number of treatment options, and is a complaint that carries with it a high risk of complications and morbidity.  The differential diagnosis includes CHF, PE, pneumonia, pneumothorax, pulmonary embolus, asthma, COPD   Co morbidities that complicate the patient evaluation  Afib not on anticoagulation, HTN, HLD, COPD, NSTEMI   Additional history obtained:  Additional history obtained from OV 02/07/23  Echo 10/23/23 showing EF of 20%   Lab Tests:  I personally interpreted labs.  The pertinent results include:   Patient CBC shows no leukocytosis, no anemia.  Creatinine is 1.2, normal liver function. D-dimer is elevated at 3.94 I did order a CT angio of the chest to rule out pulmonary embolism proBNP is elevated at 32,000   Imaging Studies ordered:  I ordered imaging studies including chest x-ray I independently visualized and interpreted imaging which showed right opacity concerning for infiltrate. CT angio chest PE study shows no evidence of large central pulmonary embolism however due to the timing of contrast there are filling deficit which either represent pulmonary embolism versus artifact, recommending rescan.  Bilateral pleural effusion right greater than left.  Pulmonary nodule.  Irregular opacity of left lung apex consistent with pneumonia.  Coronary artery disease, emphysema. I agree with the radiologist interpretation   Cardiac Monitoring: / EKG:  The patient was maintained on a cardiac monitor.  I personally viewed and interpreted the cardiac monitored which showed an underlying rhythm of: biventricular pacing.    Consultations Obtained:  I requested consultation with the hospitalist,  and discussed lab and imaging findings as well as pertinent plan. I have  consulted cardiology who agree to see patient during admission.    Problem List / ED Course / Critical interventions / Medication management  Patient presents emergency room with complaint of increasing shortness of breath and bilateral extremity edema.  On arrival hemodynamically stable.  Does have new oxygen requirement and desaturating to 89%, doing well on 2 L nasal cannula.  Patient does not have fever or leukocytosis but does have evidence of pneumonia on CT angio.  I did start him on antibiotic and add on blood cultures as well.  Patient does  clinically appear volume overloaded with significantly elevated BNP.  Will give Lasix  for this.  Denies chest pain thus doubt ACS as cause.  Doubt aortic dissection.  I did obtain CT angio which does not definitively rule out PE which I discussed with hospital team.  They will repeat CT angio versus VQ scan during admission. Consulted with cardiology who will see in consult.   I ordered medication including 40mg  IV Lasix   Reevaluation of the patient after these medicines showed that the patient stayed the same I have reviewed the patients home medicines and have made adjustments as needed      Final diagnoses:  Acute hypoxic respiratory failure (HCC)  Acute on chronic systolic congestive heart failure (HCC)  Pneumonia of left upper lobe due to infectious organism    ED Discharge Orders     None          Shermon Warren SAILOR, PA-C 02/08/24 1234    Neysa Caron PARAS, DO 02/08/24 1553  "

## 2024-02-08 NOTE — ED Notes (Signed)
 Patient transported to CT

## 2024-02-09 ENCOUNTER — Other Ambulatory Visit (HOSPITAL_COMMUNITY): Payer: Self-pay

## 2024-02-09 ENCOUNTER — Inpatient Hospital Stay (HOSPITAL_COMMUNITY)

## 2024-02-09 ENCOUNTER — Ambulatory Visit: Payer: Self-pay | Admitting: Family Medicine

## 2024-02-09 DIAGNOSIS — R0602 Shortness of breath: Secondary | ICD-10-CM | POA: Diagnosis not present

## 2024-02-09 LAB — COMPREHENSIVE METABOLIC PANEL WITH GFR
ALT: 16 U/L (ref 0–44)
AST: 28 U/L (ref 15–41)
Albumin: 3.4 g/dL — ABNORMAL LOW (ref 3.5–5.0)
Alkaline Phosphatase: 70 U/L (ref 38–126)
Anion gap: 13 (ref 5–15)
BUN: 32 mg/dL — ABNORMAL HIGH (ref 8–23)
CO2: 26 mmol/L (ref 22–32)
Calcium: 8.9 mg/dL (ref 8.9–10.3)
Chloride: 101 mmol/L (ref 98–111)
Creatinine, Ser: 1.34 mg/dL — ABNORMAL HIGH (ref 0.61–1.24)
GFR, Estimated: 54 mL/min — ABNORMAL LOW
Glucose, Bld: 81 mg/dL (ref 70–99)
Potassium: 3.7 mmol/L (ref 3.5–5.1)
Sodium: 139 mmol/L (ref 135–145)
Total Bilirubin: 0.9 mg/dL (ref 0.0–1.2)
Total Protein: 5.3 g/dL — ABNORMAL LOW (ref 6.5–8.1)

## 2024-02-09 LAB — CBC
HCT: 43.5 % (ref 39.0–52.0)
Hemoglobin: 14.3 g/dL (ref 13.0–17.0)
MCH: 32.1 pg (ref 26.0–34.0)
MCHC: 32.9 g/dL (ref 30.0–36.0)
MCV: 97.5 fL (ref 80.0–100.0)
Platelets: 116 K/uL — ABNORMAL LOW (ref 150–400)
RBC: 4.46 MIL/uL (ref 4.22–5.81)
RDW: 15.3 % (ref 11.5–15.5)
WBC: 4.5 K/uL (ref 4.0–10.5)
nRBC: 0 % (ref 0.0–0.2)

## 2024-02-09 LAB — HEPARIN LEVEL (UNFRACTIONATED): Heparin Unfractionated: 0.52 [IU]/mL (ref 0.30–0.70)

## 2024-02-09 LAB — CBG MONITORING, ED: Glucose-Capillary: 88 mg/dL (ref 70–99)

## 2024-02-09 LAB — MAGNESIUM: Magnesium: 2.2 mg/dL (ref 1.7–2.4)

## 2024-02-09 MED ORDER — ENSURE PLUS HIGH PROTEIN PO LIQD
237.0000 mL | Freq: Two times a day (BID) | ORAL | Status: DC
  Administered 2024-02-10: 237 mL via ORAL

## 2024-02-09 MED ORDER — HEPARIN (PORCINE) 25000 UT/250ML-% IV SOLN
1100.0000 [IU]/h | INTRAVENOUS | Status: DC
  Administered 2024-02-09: 1100 [IU]/h via INTRAVENOUS
  Filled 2024-02-09: qty 250

## 2024-02-09 MED ORDER — BENAZEPRIL HCL 20 MG PO TABS
20.0000 mg | ORAL_TABLET | Freq: Two times a day (BID) | ORAL | Status: DC
  Administered 2024-02-09 – 2024-02-10 (×3): 20 mg via ORAL
  Filled 2024-02-09 (×4): qty 1

## 2024-02-09 MED ORDER — FUROSEMIDE 10 MG/ML IJ SOLN
40.0000 mg | Freq: Every day | INTRAMUSCULAR | Status: DC
  Filled 2024-02-09: qty 4

## 2024-02-09 MED ORDER — AMOXICILLIN-POT CLAVULANATE 875-125 MG PO TABS
1.0000 | ORAL_TABLET | Freq: Two times a day (BID) | ORAL | Status: DC
  Administered 2024-02-09 – 2024-02-10 (×3): 1 via ORAL
  Filled 2024-02-09 (×3): qty 1

## 2024-02-09 MED ORDER — DAPAGLIFLOZIN PROPANEDIOL 10 MG PO TABS
10.0000 mg | ORAL_TABLET | Freq: Every day | ORAL | Status: DC
  Administered 2024-02-09 – 2024-02-10 (×2): 10 mg via ORAL
  Filled 2024-02-09 (×2): qty 1

## 2024-02-09 MED ORDER — HEPARIN BOLUS VIA INFUSION
4000.0000 [IU] | Freq: Once | INTRAVENOUS | Status: AC
  Administered 2024-02-09: 4000 [IU] via INTRAVENOUS
  Filled 2024-02-09: qty 4000

## 2024-02-09 NOTE — Progress Notes (Addendum)
 "  Rounding Note   Patient Name: Daniel [name]. Date of Encounter: 02/09/2024  [facility] Cardiologist: Lynwood Schilling, MD   Subjective Denies dyspnea or chest pain.  Scheduled Meds:  aspirin  EC  81 mg Oral Daily   furosemide   40 mg Intravenous BID   heparin   5,000 Units Subcutaneous Q12H   nicotine   21 mg Transdermal Daily   sodium chloride  flush  3 mL Intravenous Q12H   Continuous Infusions:  PRN Meds: acetaminophen  **OR** acetaminophen , albuterol , ALPRAZolam , bisacodyl , ondansetron  **OR** ondansetron  (ZOFRAN ) IV, polyethylene glycol   Vital Signs  Vitals:   02/09/24 0200 02/09/24 0415 02/09/24 0446 02/09/24 0630  BP: 139/89 (!) 145/97  (!) 150/102  Pulse: 64 64  63  Resp: (!) 6 17  17   Temp:   97.7 F (36.5 C)   TempSrc:   Oral   SpO2: 99% 99%  100%  Weight:      Height:       No intake or output data in the 24 hours ending 02/09/24 0659    02/08/2024    8:41 AM 02/08/2024    8:40 AM 02/07/2024    9:26 AM  Last 3 Weights  Weight (lbs) 152 lb 144 lb 158 lb  Weight (kg) 68.947 kg 65.318 kg 71.668 kg      Telemetry AV pacing- Personally Reviewed   Physical Exam  GEN: No acute distress.   Neck: No JVD Cardiac: RRR, no murmurs, rubs, or gallops.  Respiratory: Clear to auscultation bilaterally. GI: Soft, nontender, non-distended  MS: 1+ edema Neuro:  Nonfocal  Psych: Normal affect   Labs  Recent Labs  Lab 02/08/24 1253 02/08/24 1425  TRNPT 63* 56*       Chemistry Recent Labs  Lab 02/07/24 1534 02/08/24 0915 02/08/24 1253 02/09/24 0256  NA 139 139  --  139  K 4.0 3.8  --  3.7  CL 100 100  --  101  CO2 30 28  --  26  GLUCOSE 108* 114*  --  81  BUN 26* 27*  --  32*  CREATININE 1.08 1.21  --  1.34*  CALCIUM  9.6 9.5  --  8.9  MG  --   --  2.4 2.2  PROT  --  6.0*  --  5.3*  ALBUMIN   --  4.0  --  3.4*  AST  --  31  --  28  ALT  --  19  --  16  ALKPHOS  --  79  --  70  BILITOT  --  1.1  --  0.9  GFRNONAA  --  >60  --  54*   ANIONGAP  --  11  --  13    Hematology Recent Labs  Lab 02/08/24 0915 02/08/24 1253 02/09/24 0256  WBC 6.1 5.3 4.5  RBC 4.70 4.77 4.46  HGB 15.2 15.4 14.3  HCT 46.2 47.8 43.5  MCV 98.3 100.2* 97.5  MCH 32.3 32.3 32.1  MCHC 32.9 32.2 32.9  RDW 15.2 15.4 15.3  PLT 122* 121* 116*    BNP Recent Labs  Lab 02/07/24 1534 02/08/24 0915  BNP >4,200*  --   PROBNP  --  32,929.0*    DDimer  Recent Labs  Lab 02/08/24 0926  DDIMER 3.94*     Radiology  CT Angio Chest PE W and/or Wo Contrast Result Date: 02/08/2024 CLINICAL DATA:  Shortness of breath EXAM: CT ANGIOGRAPHY CHEST WITH CONTRAST TECHNIQUE: Multidetector CT imaging of the chest was  performed using the standard protocol during bolus administration of intravenous contrast. Multiplanar CT image reconstructions and MIPs were obtained to evaluate the vascular anatomy. RADIATION DOSE REDUCTION: This exam was performed according to the departmental dose-optimization program which includes automated exposure control, adjustment of the mA and/or kV according to patient size and/or use of iterative reconstruction technique. CONTRAST:  75mL OMNIPAQUE  IOHEXOL  350 MG/ML SOLN COMPARISON:  Radiograph of same day FINDINGS: Cardiovascular: Mild cardiomegaly is noted. Coronary artery calcifications are noted. No pericardial effusion. There is no definite evidence of large central pulmonary embolus seen in the main pulmonary artery or the main portions of the left and right pulmonary arteries. There is seen some degree of scatter artifact in the SVC which limits evaluation of the right pulmonary artery. There are noted multiple filling defects in multiple lower lobe branches of both pulmonary arteries, but it is difficult to determine if these actually represent pulmonary emboli or potentially artifact secondary to non opacification of the vessels due to timing of the contrast bolus. Mediastinum/Nodes: No enlarged mediastinal, hilar, or axillary lymph  nodes. Thyroid  gland, trachea, and esophagus demonstrate no significant findings. Lungs/Pleura: Bilateral pleural effusions are noted, right greater than left. No pneumothorax is noted. Probable emphysematous disease is noted. Biapical scarring is noted. 7 mm nodule is noted posteriorly in right upper lobe best seen on image number 60 of series 6. Minimal bibasilar subsegmental atelectasis is noted. Irregular opacity is noted in left lung apex most consistent with pneumonia. Upper Abdomen: No acute abnormality. Musculoskeletal: No chest wall abnormality. No acute or significant osseous findings. Review of the MIP images confirms the above findings. IMPRESSION: 1. There is no definite evidence of large central pulmonary embolus seen in the main pulmonary artery or main portions of the left and right pulmonary arteries. However, there are noted multiple filling defects in multiple lower lobe branches of both pulmonary arteries, but it is difficult to determine if these actually represent pulmonary emboli or potentially artifact secondary to non opacification of the vessels due to timing of the contrast bolus. Repeat CT pulmonary arteriogram is recommended for further evaluation if patient's renal function allows. 2. Bilateral pleural effusions are noted, right greater than left. 3. 7 mm nodule is noted posteriorly in right upper lobe. Non-contrast chest CT at 6-12 months is recommended. If the nodule is stable at time of repeat CT, then future CT at 18-24 months (from today's scan) is considered optional for low-risk patients, but is recommended for high-risk patients. This recommendation follows the consensus statement: Guidelines for Management of Incidental Pulmonary Nodules Detected on CT Images: From the Fleischner Society 2017; Radiology 2017; 284:228-243. 4. Irregular opacity is noted in left lung apex most consistent with pneumonia. 5. Coronary artery calcifications are noted. 6. Emphysema. Emphysema  (ICD10-J43.9). Electronically Signed   By: Lynwood Landy Raddle M.D.   On: 02/08/2024 11:00   DG Chest 2 View Result Date: 02/08/2024 CLINICAL DATA:  Dyspnea on exertion, bilateral lower extremity edema. EXAM: CHEST - 2 VIEW COMPARISON:  January 30, 2024 FINDINGS: Stable cardiomegaly. Status post coronary artery bypass graft. Stable left-sided defibrillator. Left lung is clear. Minimally increased right basilar opacity is noted concerning for slightly increased subsegmental atelectasis, asymmetric edema or possibly inflammation. Minimal right pleural effusion may be present. Bony thorax is unremarkable. IMPRESSION: Minimally increased right basilar opacity is noted concerning for slightly increased subsegmental atelectasis, asymmetric edema or possibly inflammation. Minimal right pleural effusion may be present. Electronically Signed   By: Lynwood Landy Raddle  M.D.   On: 02/08/2024 09:44   DG Chest 1 View Result Date: 02/08/2024 CLINICAL DATA:  Shortness of breath EXAM: CHEST  1 VIEW COMPARISON:  February 07, 2024 FINDINGS: Stable cardiomegaly. Status post coronary bypass graft. Left-sided defibrillator is unchanged. Left lung is clear. Increased right basilar opacity is noted concerning for atelectasis or infiltrate. Bony thorax is unremarkable. IMPRESSION: Increased right basilar opacity is noted concerning for atelectasis or infiltrate. Electronically Signed   By: Lynwood Landy Raddle M.D.   On: 02/08/2024 09:42    Patient Profile    79 year old male with past medical history of coronary artery disease status post coronary bypass and graft, ischemic cardiomyopathy, hyperlipidemia, hypertension, status post ICD with recent upgrade to CRT, prostate cancer, abdominal aortic aneurysm for evaluation of acute on chronic systolic congestive heart failure. Echocardiogram September 2025 showed ejection fraction 20%, moderate left ventricular enlargement, mild left ventricular hypertrophy, moderate RV dysfunction, moderate  biatrial enlargement, moderate to severe mitral regurgitation, mild aortic insufficiency. Patient had CRT upgrade of ICD on December 30. CTA shows no definite evidence of large central pulmonary embolus but multiple filling defects pulmonary emboli versus artifact, bilateral pleural effusions, 7 mm right upper lobe nodule with follow-up recommended in 6 to 12 months.   Assessment & Plan  1 acute on chronic systolic congestive heart failure-volume status improving.  Decrease Lasix  to 40 mg IV daily.  Continue Farxiga .  Did not tolerate spironolactone  secondary to hyperkalemia.  Follow renal function closely.  Possible discharge tomorrow morning if stable.   2 ischemic cardiomyopathy-patient apparently did not tolerate Entresto  or BiDil.  Also did not tolerate carvedilol , Toprol  or bisoprolol .  Would resume home dose of benazepril  20 mg twice daily.   3 coronary artery disease status post coronary bypass and graft-would continue aspirin .  He does not tolerate statins.   4 ICD-he is status post CRT upgrade.  Device was interrogated with normal function.   5 hypertension-blood pressure elevated.  Resume lisinopril and adjust based on follow-up readings.   6 hyperlipidemia-intolerant to statins and apparently unwilling to try other lipid-lowering medications previously.   7 history of abdominal aortic aneurysm-follow-up as an outpatient.  For questions or updates, please contact [facility] Please consult [email or web address] for contact info under   Signed, Redell Shallow, MD  02/09/2024, 6:59 AM    "

## 2024-02-09 NOTE — Progress Notes (Signed)
 Initial Nutrition Assessment  DOCUMENTATION CODES:  Severe malnutrition in context of chronic illness  INTERVENTION:  Ensure Plus High Protein PO BID. Each supplement provides 350 Kcals and 20 grams of protein. Continue 2 g sodium diet.  NUTRITION DIAGNOSIS:  Severe Malnutrition related to chronic illness as evidenced by severe muscle depletion, severe fat depletion   GOAL:  Patient will meet greater than or equal to 90% of their needs   MONITOR:  PO intake, Supplement acceptance  REASON FOR ASSESSMENT:  Consult Assessment of nutrition requirement/status  ASSESSMENT:  Patient presented with shortness of breath and lower extremity edema secondary to heart failure. 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  The patient states that he has been feeling early satiety for the past 6-8 months and would eat just two meals a day. His UBW is 147 lbs and it has been stable outside of edema. He ate 100% of breakfast today and about 75% of his lunch. He is amenable to Ensure.  Typical day's intake: Breakfast - pancake, eggs, biscuit Lunch - pinto beans and taters Dinner - chicken salad sandwich  Scheduled Meds:  amoxicillin -clavulanate  1 tablet Oral Q12H   aspirin  EC  81 mg Oral Daily   benazepril   20 mg Oral BID   dapagliflozin  propanediol  10 mg Oral Daily   [START ON 02/10/2024] furosemide   40 mg Intravenous Daily   heparin   4,000 Units Intravenous Once   nicotine   21 mg Transdermal Daily   sodium chloride  flush  3 mL Intravenous Q12H   Continuous Infusions:  heparin      PRN Meds:.acetaminophen  **OR** acetaminophen , albuterol , ALPRAZolam , bisacodyl , ondansetron  **OR** ondansetron  (ZOFRAN ) IV, polyethylene glycol  Diet Order             Diet 2 gram sodium Room service appropriate? Yes; Fluid consistency: Thin  Diet effective now                  Meal  Intake: Patient states he ate 100% breakfast today and 75% of lunch  Labs:     Latest Ref Rng & Units 02/09/2024    2:56 AM 02/08/2024    9:15 AM 02/07/2024    3:34 PM  CMP  Glucose 70 - 99 mg/dL 81  885  891   BUN 8 - 23 mg/dL 32  27  26   Creatinine 0.61 - 1.24 mg/dL 8.65  8.78  8.91   Sodium 135 - 145 mmol/L 139  139  139   Potassium 3.5 - 5.1 mmol/L 3.7  3.8  4.0   Chloride 98 - 111 mmol/L 101  100  100   CO2 22 - 32 mmol/L 26  28  30    Calcium  8.9 - 10.3 mg/dL 8.9  9.5  9.6   Total Protein 6.5 - 8.1 g/dL 5.3  6.0    Total Bilirubin 0.0 - 1.2 mg/dL 0.9  1.1    Alkaline Phos 38 - 126 U/L 70  79    AST 15 - 41 U/L 28  31    ALT 0 - 44 U/L 16  19      I/O: -400 mL since admit  NUTRITION - FOCUSED PHYSICAL EXAM: Flowsheet Row Most Recent Value  Orbital Region Moderate depletion  Upper Arm Region Severe depletion  Thoracic and Lumbar Region Severe depletion  Buccal Region Moderate depletion  Temple Region Severe depletion  Clavicle Bone Region Severe depletion  Clavicle and Acromion Bone Region Severe depletion  Dorsal Hand Severe depletion  Patellar Region Unable to assess  [patient had long pants on]  Anterior Thigh Region Unable to assess  [patient had long pants on]  Posterior Calf Region Unable to assess  [edema]  Edema (RD Assessment) Moderate  [+3 BLE]  Hair --  [none]  Eyes Reviewed  Mouth Reviewed  [full dentures]  Skin Reviewed  Nails Reviewed    EDUCATION NEEDS:  Education needs have been addressed  Skin:  Skin Assessment: Reviewed RN Assessment (no data charted)  Last BM:  no data charted  Height:  Ht Readings from Last 1 Encounters:  02/08/24 5' 10 (1.778 m)   Weight:  Wt Readings from Last 10 Encounters:  02/08/24 68.9 kg  02/07/24 71.7 kg  01/30/24 65.8 kg  01/23/24 69 kg  01/04/24 69.9 kg  12/21/23 68 kg  12/19/23 68.9 kg  11/23/23 65.6 kg  11/22/23 65.4 kg  10/26/23 64 kg   Weight Change: relatively stable weight noted  Usual Body  Weight: 147 lbs per patient  Edema: +3 BLE  Ideal Body Weight:  75.5 kg   BMI:  Body mass index is 21.81 kg/m.  Estimated Daily Nutritional Needs:  Kcal:  1900-2100 Protein:  100-115 g Fluid:  >/=1900 mL    Daniel Ruth, MS, RDN, LDN [place]. [facility] See AMION for contact information Secure chat preferred

## 2024-02-09 NOTE — Progress Notes (Signed)
 Date and time results received: 02/09/2024 14:48  Test: CT angiogram of chest Critical Value: acute pulmonary embolus involving left upper lung lobe with no evidence of significant right heart strain  Name of Provider Notified: Dr. Jillian  Orders Received? Or Actions Taken?: MD action pending

## 2024-02-09 NOTE — Plan of Care (Signed)
" °  Problem: Cardiac: Goal: Ability to achieve and maintain adequate cardiopulmonary perfusion will improve Outcome: Progressing   Problem: Education: Goal: Knowledge of General Education information will improve Description: Including pain rating scale, medication(s)/side effects and non-pharmacologic comfort measures Outcome: Progressing   Problem: Health Behavior/Discharge Planning: Goal: Ability to manage health-related needs will improve Outcome: Progressing   Problem: Clinical Measurements: Goal: Ability to maintain clinical measurements within normal limits will improve Outcome: Progressing Goal: Will remain free from infection Outcome: Progressing Goal: Diagnostic test results will improve Outcome: Progressing Goal: Respiratory complications will improve Outcome: Progressing Goal: Cardiovascular complication will be avoided Outcome: Progressing   Problem: Activity: Goal: Risk for activity intolerance will decrease Outcome: Progressing   Problem: Nutrition: Goal: Adequate nutrition will be maintained Outcome: Completed/Met   Problem: Coping: Goal: Level of anxiety will decrease Outcome: Progressing   Problem: Elimination: Goal: Will not experience complications related to bowel motility Outcome: Progressing Goal: Will not experience complications related to urinary retention Outcome: Progressing   Problem: Pain Managment: Goal: General experience of comfort will improve and/or be controlled Outcome: Progressing   Problem: Safety: Goal: Ability to remain free from injury will improve Outcome: Progressing   Problem: Skin Integrity: Goal: Risk for impaired skin integrity will decrease Outcome: Progressing   Problem: Consults Goal: Venous Thromboembolism Patient Education Description: See Patient Education Module for education specifics. Outcome: Progressing Goal: Diagnosis - Venous Thromboembolism (VTE) Description: Choose a selection Outcome:  Completed/Met Note: PE (Pulmonary Embolism) Goal: Pharmacy Consult for anticoagulation Outcome: Completed/Met Goal: Skin Care Protocol Initiated - if Braden Score 18 or less Description: If consults are not indicated, leave blank or document N/A Outcome: Progressing Goal: Nutrition Consult-if indicated Outcome: Progressing Goal: Diabetes Guidelines if Diabetic/Glucose > 140 Description: If diabetic or lab glucose is > 140 mg/dl - Initiate Diabetes/Hyperglycemia Guidelines & Document Interventions  Outcome: Progressing   Problem: Phase I Progression Outcomes Goal: Pain controlled with appropriate interventions Outcome: Completed/Met Goal: Dyspnea controlled at rest (PE) Outcome: Completed/Met Goal: Tolerating diet Outcome: Completed/Met Goal: Initial discharge plan identified Outcome: Completed/Met Goal: Voiding-avoid urinary catheter unless indicated Outcome: Completed/Met Goal: Hemodynamically stable Outcome: Completed/Met Goal: Other Phase I Outcomes/Goals Outcome: Progressing   Problem: Phase II Progression Outcomes Goal: 02 sats trending upward/stable (PE) Outcome: Completed/Met Goal: Discharge plan established Outcome: Progressing   "

## 2024-02-09 NOTE — Progress Notes (Signed)
 PHARMACY - ANTICOAGULATION CONSULT NOTE  Pharmacy Consult for Heparin  Indication: pulmonary embolus  Allergies[1]  Patient Measurements: Height: 5' 10 (177.8 cm) Weight: 68.9 kg (152 lb) IBW/kg (Calculated) : 73 HEPARIN  DW (KG): 65.3  Vital Signs: Temp: 97.7 F (36.5 C) (01/09 1429) Temp Source: Oral (01/09 1429) BP: 145/102 (01/09 1429) Pulse Rate: 63 (01/09 1429)  Labs: Recent Labs    02/07/24 1534 02/08/24 0915 02/08/24 0915 02/08/24 1253 02/09/24 0256  HGB  --  15.2   < > 15.4 14.3  HCT  --  46.2  --  47.8 43.5  PLT  --  122*  --  121* 116*  CREATININE 1.08 1.21  --   --  1.34*   < > = values in this interval not displayed.    Estimated Creatinine Clearance: 44.3 mL/min (A) (by C-G formula based on SCr of 1.34 mg/dL (H)).  Assessment: 78 YOM. Presented for shortness of breath. New PE found, not on anticoagulation at home. Patient did receive 5000 units subcutaneous heparin  this AM.  Goal of Therapy:  Heparin  level 0.3-0.7 units/ml Monitor platelets by anticoagulation protocol: Yes   Plan:  Give 4000 units bolus x 1 Start heparin  infusion at 1100 units/hr Check anti-Xa level in 8 hours and daily while on heparin  Continue to monitor H&H and platelets  Larraine Brazier, PharmD Clinical Pharmacist 02/09/2024  3:07 PM **Pharmacist phone directory can now be found on [email or web address] ([email or web address]).  Listed under [facility].       [1]  Allergies Allergen Reactions   Entresto  [Sacubitril -Valsartan ] Hives and Shortness Of Breath   Amlodipine  Swelling    LE swelling   Contrast Media [Iodinated Contrast Media] Other (See Comments)    Prostate problem had to wear a catheter for two weeks after having dye.    Hydralazine  Palpitations and Other (See Comments)    Fatigue+   Carvedilol  Other (See Comments)    Bradycardia   Spironolactone  Other (See Comments)    Hyperkalemia

## 2024-02-09 NOTE — Progress Notes (Signed)
 " PROGRESS NOTE  Daniel [name] Daniel [name].  FMW:[id number] DOB: [birth year] DOA: 02/08/2024 PCP: Daniel Aleene DEL, MD   Brief Narrative: Patient is a 79 year old male with history of hypertension, hyperlipidemia, coronary disease, NSTEMI, CHF with EF of 20%, pacemaker implantation who presented with shortness of breath, lower extremity edema.  Recently underwent dual-chamber ICD/pacemaker, insertion of new coronary sinus pacing lead, new biventricular ICD.  On presentation he was hemodynamically stable.  Lab work showed severe elevated proBNP in the range of 30K, EKG showed QTc of 512.  Chest x-ray was concerning for right basilar opacity.  CT angiogram PE was suboptimal but noted to have multiple filling defects in the multiple lower lobe branches of both pulmonary artery, could not rule out PE.  Cardiology consulted.  Currently on IV Lasix   Assessment & Plan:  Principal Problem:   SOB (shortness of breath)  Acute on chronic HFrEF: Presented with dyspnea, lower EXTR edema.  Last echo on September 2012 showed EF of 20%.  Status post ICD placement.  Elevated BNP.  Currently on IV Lasix  per cardiology following.  Device was interrogated with normal function.  He appears almost euvolemic today.  History of ischemic cardiomyopathy: Status post recent ICD placement  Abnormal CT angiogram study: CT angiogram PE was suboptimal but noted to have multiple filling defects in the multiple lower lobe branches of both pulmonary artery, could not rule out PE.  Did not show large vessel filling defects.  Radiology recommended repeat CT angiogram test.  Repeat CT angiogram ordered.  Pneumonia?:  Patient does not have signs of pneumonia.  No fever ,leukocytosis or hypoxia.  Chest CT showed rregular opacity is noted in left lung apex most consistent with pneumonia.  Started on Augmentin   Coronary disease status post CABG: On aspirin , statin .  Does not tolerate Entresto  or BiDil, beta-blocker.  On benazepril .  No anginal  symptoms  History of abdominal aortic aneurysm: Recommend outpatient follow-up  History of hyperlipidemia: Does not tolerate statin.  Not willing to try  Hypertension: On ACE inhibitor  Tobacco use: Continue nicotine  patch         DVT prophylaxis:heparin  injection 5,000 Units Start: 02/08/24 2200     Code Status: Full Code  Family Communication: None at the bedside  Patient status: Inpatient  Patient is from : Home  Anticipated discharge to: Home  Estimated DC date: Tomorrow   Consultants: Cardiology  Procedures: None yet  Antimicrobials:  Anti-infectives (From admission, onward)    Start     Dose/Rate Route Frequency Ordered Stop   02/09/24 1000  cefTRIAXone  (ROCEPHIN ) 1 g in sodium chloride  0.9 % 100 mL IVPB  Status:  Discontinued        1 g 200 mL/hr over 30 Minutes Intravenous Every 24 hours 02/08/24 1530 02/08/24 1853   02/08/24 2200  doxycycline  (VIBRA -TABS) tablet 100 mg  Status:  Discontinued        100 mg Oral Every 12 hours 02/08/24 1531 02/08/24 1853   02/08/24 1130  doxycycline  (VIBRA -TABS) tablet 100 mg        100 mg Oral  Once 02/08/24 1119 02/08/24 1237   02/08/24 1115  cefTRIAXone  (ROCEPHIN ) 1 g in sodium chloride  0.9 % 100 mL IVPB        1 g 200 mL/hr over 30 Minutes Intravenous  Once 02/08/24 1104 02/08/24 1236   02/08/24 1115  azithromycin  (ZITHROMAX ) 500 mg in sodium chloride  0.9 % 250 mL IVPB  Status:  Discontinued  500 mg 250 mL/hr over 60 Minutes Intravenous  Once 02/08/24 1104 02/08/24 1119       Subjective: Patient seen and examined at bedside today.  Very comfortable this morning.  On room air.  Denied any chest pain or shortness of breath.  Lower extremity edema significantly improved.  Denies any cough.  Objective: Vitals:   02/09/24 0200 02/09/24 0415 02/09/24 0446 02/09/24 0630  BP: 139/89 (!) 145/97  (!) 150/102  Pulse: 64 64  63  Resp: (!) 6 17  17   Temp:   97.7 F (36.5 C)   TempSrc:   Oral   SpO2: 99% 99%   100%  Weight:      Height:        Intake/Output Summary (Last 24 hours) at 02/09/2024 0831 Last data filed at 02/09/2024 9278 Gross per 24 hour  Intake 10 ml  Output 400 ml  Net -390 ml   Filed Weights   02/08/24 0840 02/08/24 0841  Weight: 65.3 kg 68.9 kg    Examination:  General exam: Overall comfortable, not in distress, pleasant gentleman   HEENT: PERRL Respiratory system:  no wheezes or crackles  Cardiovascular system: S1 & S2 heard, RRR.  Gastrointestinal system: Abdomen is nondistended, soft and nontender. Central nervous system: Alert and oriented Extremities: Very trace bilateral lower extremity pitting edema, no clubbing ,no cyanosis Skin: No rashes, no ulcers,no icterus     Data Reviewed: I have personally reviewed following labs and imaging studies  CBC: Recent Labs  Lab 02/08/24 0915 02/08/24 1253 02/09/24 0256  WBC 6.1 5.3 4.5  HGB 15.2 15.4 14.3  HCT 46.2 47.8 43.5  MCV 98.3 100.2* 97.5  PLT 122* 121* 116*   Basic Metabolic Panel: Recent Labs  Lab 02/07/24 1534 02/08/24 0915 02/08/24 1253 02/09/24 0256  NA 139 139  --  139  K 4.0 3.8  --  3.7  CL 100 100  --  101  CO2 30 28  --  26  GLUCOSE 108* 114*  --  81  BUN 26* 27*  --  32*  CREATININE 1.08 1.21  --  1.34*  CALCIUM  9.6 9.5  --  8.9  MG  --   --  2.4 2.2     No results found for this or any previous visit (from the past 240 hours).   Radiology Studies: CT Angio Chest PE W and/or Wo Contrast Result Date: 02/08/2024 CLINICAL DATA:  Shortness of breath EXAM: CT ANGIOGRAPHY CHEST WITH CONTRAST TECHNIQUE: Multidetector CT imaging of the chest was performed using the standard protocol during bolus administration of intravenous contrast. Multiplanar CT image reconstructions and MIPs were obtained to evaluate the vascular anatomy. RADIATION DOSE REDUCTION: This exam was performed according to the departmental dose-optimization program which includes automated exposure control, adjustment of the  mA and/or kV according to patient size and/or use of iterative reconstruction technique. CONTRAST:  75mL OMNIPAQUE  IOHEXOL  350 MG/ML SOLN COMPARISON:  Radiograph of same day FINDINGS: Cardiovascular: Mild cardiomegaly is noted. Coronary artery calcifications are noted. No pericardial effusion. There is no definite evidence of large central pulmonary embolus seen in the main pulmonary artery or the main portions of the left and right pulmonary arteries. There is seen some degree of scatter artifact in the SVC which limits evaluation of the right pulmonary artery. There are noted multiple filling defects in multiple lower lobe branches of both pulmonary arteries, but it is difficult to determine if these actually represent pulmonary emboli or potentially artifact secondary to  non opacification of the vessels due to timing of the contrast bolus. Mediastinum/Nodes: No enlarged mediastinal, hilar, or axillary lymph nodes. Thyroid  gland, trachea, and esophagus demonstrate no significant findings. Lungs/Pleura: Bilateral pleural effusions are noted, right greater than left. No pneumothorax is noted. Probable emphysematous disease is noted. Biapical scarring is noted. 7 mm nodule is noted posteriorly in right upper lobe best seen on image number 60 of series 6. Minimal bibasilar subsegmental atelectasis is noted. Irregular opacity is noted in left lung apex most consistent with pneumonia. Upper Abdomen: No acute abnormality. Musculoskeletal: No chest wall abnormality. No acute or significant osseous findings. Review of the MIP images confirms the above findings. IMPRESSION: 1. There is no definite evidence of large central pulmonary embolus seen in the main pulmonary artery or main portions of the left and right pulmonary arteries. However, there are noted multiple filling defects in multiple lower lobe branches of both pulmonary arteries, but it is difficult to determine if these actually represent pulmonary emboli or  potentially artifact secondary to non opacification of the vessels due to timing of the contrast bolus. Repeat CT pulmonary arteriogram is recommended for further evaluation if patient's renal function allows. 2. Bilateral pleural effusions are noted, right greater than left. 3. 7 mm nodule is noted posteriorly in right upper lobe. Non-contrast chest CT at 6-12 months is recommended. If the nodule is stable at time of repeat CT, then future CT at 18-24 months (from today's scan) is considered optional for low-risk patients, but is recommended for high-risk patients. This recommendation follows the consensus statement: Guidelines for Management of Incidental Pulmonary Nodules Detected on CT Images: From the Fleischner Society 2017; Radiology 2017; 284:228-243. 4. Irregular opacity is noted in left lung apex most consistent with pneumonia. 5. Coronary artery calcifications are noted. 6. Emphysema. Emphysema (ICD10-J43.9). Electronically Signed   By: Daniel Landy Raddle M.D.   On: 02/08/2024 11:00   DG Chest 2 View Result Date: 02/08/2024 CLINICAL DATA:  Dyspnea on exertion, bilateral lower extremity edema. EXAM: CHEST - 2 VIEW COMPARISON:  January 30, 2024 FINDINGS: Stable cardiomegaly. Status post coronary artery bypass graft. Stable left-sided defibrillator. Left lung is clear. Minimally increased right basilar opacity is noted concerning for slightly increased subsegmental atelectasis, asymmetric edema or possibly inflammation. Minimal right pleural effusion may be present. Bony thorax is unremarkable. IMPRESSION: Minimally increased right basilar opacity is noted concerning for slightly increased subsegmental atelectasis, asymmetric edema or possibly inflammation. Minimal right pleural effusion may be present. Electronically Signed   By: Daniel Landy Raddle M.D.   On: 02/08/2024 09:44   DG Chest 1 View Result Date: 02/08/2024 CLINICAL DATA:  Shortness of breath EXAM: CHEST  1 VIEW COMPARISON:  February 07, 2024  FINDINGS: Stable cardiomegaly. Status post coronary bypass graft. Left-sided defibrillator is unchanged. Left lung is clear. Increased right basilar opacity is noted concerning for atelectasis or infiltrate. Bony thorax is unremarkable. IMPRESSION: Increased right basilar opacity is noted concerning for atelectasis or infiltrate. Electronically Signed   By: Daniel Landy Raddle M.D.   On: 02/08/2024 09:42    Scheduled Meds:  aspirin  EC  81 mg Oral Daily   benazepril   20 mg Oral BID   dapagliflozin  propanediol  10 mg Oral Daily   [START ON 02/10/2024] furosemide   40 mg Intravenous Daily   heparin   5,000 Units Subcutaneous Q12H   nicotine   21 mg Transdermal Daily   sodium chloride  flush  3 mL Intravenous Q12H   Continuous Infusions:   LOS: 1  day   Ivonne Mustache, MD [facility] P1/10/2024, 8:31 AM  "

## 2024-02-09 NOTE — ED Notes (Signed)
 Patient resting in bed, reports improvement in shortness of breath from yesterday. Urinal emptied, no needs expressed.

## 2024-02-09 NOTE — Evaluation (Signed)
 Occupational Therapy Evaluation and Discharge Patient Details Name: Daniel [name]. MRN: [id number] DOB: [birth year] Today's Date: 02/09/2024   History of Present Illness   Elliot Meldrum. is a 79 y.o. M who presented to [facility] ED 02/08/24 for SOB, worsening BLE edema, and ~8lb weight gain. Chest x-ray concerning for infiltrate versus atelectasis. CTA chest showed no definite evidence of large PE, multiple filling defect in multiple lower lobe branches of both pulmonary arteries which is difficult to determine if these actually represent PE or potentially artifact; bilateral pleural effusion R>L;  7mm nodule is noted posteriorly in right upper lobe. PMHx: HTN, HLD, CAD, NSTEMI, CHFwith EF of 20%, prostate cancer, abdominal aortic aneurysm, and ICD with recent upgrade to CRT (01/30/24).     Clinical Impressions Pt is typically independent in ADL and mobility. He helps care for his wife who has been WC bound for community mobility (RW in the house) since Jan of last year (2025). Today he presents at baseline for ADL demonstrating UB and LB ADL, standing activity (although required standing rest break for hallway ambulation) V tach noted during session. Pt energy conservation education provided. Recommend tub bench for safety and energy conservation. OT will sign off at this time and recommend continued work with mobility team.      If plan is discharge home, recommend the following:   A little help with walking and/or transfers     Functional Status Assessment   Patient has had a recent decline in their functional status and demonstrates the ability to make significant improvements in function in a reasonable and predictable amount of time.     Equipment Recommendations   Tub/shower bench     Recommendations for Other Services   PT consult     Precautions/Restrictions   Precautions Precautions: Fall Recall of Precautions/Restrictions: Intact Restrictions Weight Bearing  Restrictions Per Provider Order: No     Mobility Bed Mobility Overal bed mobility: Modified Independent             General bed mobility comments: HOB slightly elevated.    Transfers Overall transfer level: Needs assistance Equipment used: None Transfers: Sit to/from Stand Sit to Stand: Supervision           General transfer comment: Pt stood from stretcher. He pushed up with BUE support. Powered up without physical assist. Close supervision for safety. Good eccentric control.      Balance Overall balance assessment: Needs assistance Sitting-balance support: No upper extremity supported, Feet supported Sitting balance-Leahy Scale: Fair Sitting balance - Comments: Sat EOB with supervision   Standing balance support: No upper extremity supported, During functional activity Standing balance-Leahy Scale: Fair Standing balance comment: Pt ambulated with CGA.                           ADL either performed or assessed with clinical judgement   ADL Overall ADL's : At baseline                                       General ADL Comments: Pt able to demonstrate UB and LB ADL, sink level grooming, standing tolerance. Pt does demonstrate DOE with hallway ambulation 2/4     Vision Baseline Vision/History: 1 Wears glasses Patient Visual Report: No change from baseline Vision Assessment?: No apparent visual deficits     Perception  Praxis         Pertinent Vitals/Pain Pain Assessment Pain Assessment: No/denies pain     Extremity/Trunk Assessment Upper Extremity Assessment Upper Extremity Assessment: Overall WFL for tasks assessed   Lower Extremity Assessment Lower Extremity Assessment: Defer to PT evaluation   Cervical / Trunk Assessment Cervical / Trunk Assessment: Normal   Communication Communication Communication: No apparent difficulties   Cognition Arousal: Alert Behavior During Therapy: WFL for tasks  assessed/performed Cognition: No apparent impairments                               Following commands: Intact       Cueing  General Comments   Cueing Techniques: Verbal cues  DOE with hallway ambulation 2/4   Exercises     Shoulder Instructions      Home Living Family/patient expects to be discharged to:: Private residence Living Arrangements: Spouse/significant other;Children (Wife; adult son) Available Help at Discharge: Family;Available 24 hours/day Type of Home: House Home Access: Stairs to enter;Ramped entrance Entrance Stairs-Number of Steps: 1 (threshold)   Home Layout: One level     Bathroom Shower/Tub: Producer, Television/film/video: Handicapped height Bathroom Accessibility: Yes   Home Equipment: Agricultural Consultant (2 wheels);Wheelchair - manual;BSC/3in1;Hand held shower head;Grab bars - tub/shower   Additional Comments: wife has her own health issues and is in a WC      Prior Functioning/Environment Prior Level of Function : Independent/Modified Independent             Mobility Comments: Indep no AD. Denies fall hx. ADLs Comments: Indep with ADLs. Pt reports he is waiting on his wife, she is still a little handicap from her fall in January 2025. Housework and cooking. Manages his own medications. Drive.    OT Problem List: Decreased activity tolerance;Cardiopulmonary status limiting activity;Increased edema   OT Treatment/Interventions:        OT Goals(Current goals can be found in the care plan section)   Acute Rehab OT Goals Patient Stated Goal: get my breath back OT Goal Formulation: With patient   OT Frequency:       Co-evaluation              AM-PAC OT 6 Clicks Daily Activity     Outcome Measure Help from another person eating meals?: None Help from another person taking care of personal grooming?: None Help from another person toileting, which includes using toliet, bedpan, or urinal?: None Help from another  person bathing (including washing, rinsing, drying)?: None Help from another person to put on and taking off regular upper body clothing?: None Help from another person to put on and taking off regular lower body clothing?: None 6 Click Score: 24   End of Session Equipment Utilized During Treatment: Gait belt Nurse Communication: Mobility status  Activity Tolerance: Patient tolerated treatment well Patient left: in bed;with call bell/phone within reach  OT Visit Diagnosis: Unsteadiness on feet (R26.81) (cardiopulmonary status limiting activity)                Time: 8992-8964 OT Time Calculation (min): 28 min Charges:  OT General Charges $OT Visit: 1 Visit OT Evaluation $OT Eval Low Complexity: 1 Low OT Treatments $Self Care/Home Management : 8-22 mins  Leita DEL OTR/L Acute Rehabilitation Services Office: [phone]'   Leita JINNY Odea 02/09/2024, 1:42 PM

## 2024-02-10 ENCOUNTER — Other Ambulatory Visit (HOSPITAL_COMMUNITY): Payer: Self-pay

## 2024-02-10 DIAGNOSIS — Z95810 Presence of automatic (implantable) cardiac defibrillator: Secondary | ICD-10-CM

## 2024-02-10 DIAGNOSIS — I2690 Septic pulmonary embolism without acute cor pulmonale: Secondary | ICD-10-CM

## 2024-02-10 DIAGNOSIS — I5021 Acute systolic (congestive) heart failure: Secondary | ICD-10-CM

## 2024-02-10 DIAGNOSIS — I340 Nonrheumatic mitral (valve) insufficiency: Secondary | ICD-10-CM

## 2024-02-10 DIAGNOSIS — R0602 Shortness of breath: Secondary | ICD-10-CM | POA: Diagnosis not present

## 2024-02-10 LAB — HEPARIN LEVEL (UNFRACTIONATED): Heparin Unfractionated: 0.56 [IU]/mL (ref 0.30–0.70)

## 2024-02-10 LAB — CBC
HCT: 44.9 % (ref 39.0–52.0)
Hemoglobin: 15.4 g/dL (ref 13.0–17.0)
MCH: 32.7 pg (ref 26.0–34.0)
MCHC: 34.3 g/dL (ref 30.0–36.0)
MCV: 95.3 fL (ref 80.0–100.0)
Platelets: 127 K/uL — ABNORMAL LOW (ref 150–400)
RBC: 4.71 MIL/uL (ref 4.22–5.81)
RDW: 15.4 % (ref 11.5–15.5)
WBC: 4.6 K/uL (ref 4.0–10.5)
nRBC: 0 % (ref 0.0–0.2)

## 2024-02-10 LAB — BASIC METABOLIC PANEL WITH GFR
Anion gap: 11 (ref 5–15)
BUN: 31 mg/dL — ABNORMAL HIGH (ref 8–23)
CO2: 24 mmol/L (ref 22–32)
Calcium: 9 mg/dL (ref 8.9–10.3)
Chloride: 99 mmol/L (ref 98–111)
Creatinine, Ser: 0.97 mg/dL (ref 0.61–1.24)
GFR, Estimated: >60 mL/min
Glucose, Bld: 78 mg/dL (ref 70–99)
Potassium: 4.1 mmol/L (ref 3.5–5.1)
Sodium: 134 mmol/L — ABNORMAL LOW (ref 135–145)

## 2024-02-10 MED ORDER — AMOXICILLIN-POT CLAVULANATE 875-125 MG PO TABS
1.0000 | ORAL_TABLET | Freq: Two times a day (BID) | ORAL | 0 refills | Status: DC
  Filled 2024-02-10: qty 7, 4d supply, fill #0

## 2024-02-10 MED ORDER — APIXABAN (ELIQUIS) VTE STARTER PACK (10MG AND 5MG)
ORAL_TABLET | ORAL | 0 refills | Status: DC
  Filled 2024-02-10: qty 74, 30d supply, fill #0

## 2024-02-10 MED ORDER — APIXABAN 5 MG PO TABS
10.0000 mg | ORAL_TABLET | Freq: Two times a day (BID) | ORAL | Status: DC
  Administered 2024-02-10: 10 mg via ORAL
  Filled 2024-02-10: qty 2

## 2024-02-10 MED ORDER — APIXABAN 5 MG PO TABS: 5.0000 mg | ORAL_TABLET | Freq: Two times a day (BID) | ORAL | Status: DC

## 2024-02-10 MED ORDER — NICOTINE 21 MG/24HR TD PT24
21.0000 mg | MEDICATED_PATCH | Freq: Every day | TRANSDERMAL | 0 refills | Status: AC
  Filled 2024-02-10: qty 28, 28d supply, fill #0

## 2024-02-10 MED ORDER — FUROSEMIDE 40 MG PO TABS
40.0000 mg | ORAL_TABLET | Freq: Every day | ORAL | 0 refills | Status: AC
  Filled 2024-02-10: qty 60, 60d supply, fill #0

## 2024-02-10 MED ORDER — FUROSEMIDE 40 MG PO TABS
40.0000 mg | ORAL_TABLET | Freq: Every day | ORAL | Status: DC
  Administered 2024-02-10: 40 mg via ORAL
  Filled 2024-02-10: qty 1

## 2024-02-10 NOTE — Discharge Instructions (Signed)
 Information on my medicine - ELIQUIS  (apixaban )  This medication education was reviewed with me or my healthcare representative as part of my discharge preparation.  The pharmacist that spoke with me during my hospital stay was:  Elma Fail, [facility]  Why was Eliquis  prescribed for you? Eliquis  was prescribed for you to reduce the risk of forming blood clots that can cause a stroke if you have a medical condition called atrial fibrillation (a type of irregular heartbeat) OR to reduce the risk of a blood clots forming after orthopedic surgery.  What do You need to know about Eliquis  ? Take your Eliquis  TWICE DAILY - one tablet in the morning and one tablet in the evening with or without food.  It would be best to take the doses about the same time each day.  If you have difficulty swallowing the tablet whole please discuss with your pharmacist how to take the medication safely.  Take Eliquis  exactly as prescribed by your doctor and DO NOT stop taking Eliquis  without talking to the doctor who prescribed the medication.  Stopping may increase your risk of developing a new clot or stroke.  Refill your prescription before you run out.  After discharge, you should have regular check-up appointments with your healthcare provider that is prescribing your Eliquis .  In the future your dose may need to be changed if your kidney function or weight changes by a significant amount or as you get older.  What do you do if you miss a dose? If you miss a dose, take it as soon as you remember on the same day and resume taking twice daily.  Do not take more than one dose of ELIQUIS  at the same time.  Important Safety Information A possible side effect of Eliquis  is bleeding. You should call your healthcare provider right away if you experience any of the following: Bleeding from an injury or your nose that does not stop. Unusual colored urine (red or dark brown) or unusual colored stools (red or  black). Unusual bruising for unknown reasons. A serious fall or if you hit your head (even if there is no bleeding).  Some medicines may interact with Eliquis  and might increase your risk of bleeding or clotting while on Eliquis . To help avoid this, consult your healthcare provider or pharmacist prior to using any new prescription or non-prescription medications, including herbals, vitamins, non-steroidal anti-inflammatory drugs (NSAIDs) and supplements.  This website has more information on Eliquis  (apixaban ): http://[email or web address] .com/eliquis dena

## 2024-02-10 NOTE — Progress Notes (Signed)
 PHARMACY - ANTICOAGULATION  Pharmacy Consult for Heparin  Indication: pulmonary embolus Brief A/P: Heparin  level within goal range Continue Heparin  at current rate   Allergies[1]  Patient Measurements: Height: 5' 10 (177.8 cm) Weight: 68.9 kg (152 lb) IBW/kg (Calculated) : 73 HEPARIN  DW (KG): 65.3  Vital Signs: Temp: 97.8 F (36.6 C) (01/09 2342) Temp Source: Tympanic (01/09 2342) BP: 155/87 (01/09 2011) Pulse Rate: 65 (01/09 2011)  Labs: Recent Labs    02/07/24 1534 02/08/24 0915 02/08/24 0915 02/08/24 1253 02/09/24 0256 02/09/24 2233  HGB  --  15.2   < > 15.4 14.3  --   HCT  --  46.2  --  47.8 43.5  --   PLT  --  122*  --  121* 116*  --   HEPARINUNFRC  --   --   --   --   --  0.52  CREATININE 1.08 1.21  --   --  1.34*  --    < > = values in this interval not displayed.    Estimated Creatinine Clearance: 44.3 mL/min (A) (by C-G formula based on SCr of 1.34 mg/dL (H)).  Assessment: 79 y.o. male with PE for heparin   Goal of Therapy:  Heparin  level 0.3-0.7 units/ml Monitor platelets by anticoagulation protocol: Yes   Plan:  No change to heparin  Follow-up am labs.  Cathlyn Arrant, PharmD, BCPS        [1]  Allergies Allergen Reactions   Entresto  [Sacubitril -Valsartan ] Hives and Shortness Of Breath   Amlodipine  Swelling    LE swelling   Contrast Media [Iodinated Contrast Media] Other (See Comments)    Prostate problem had to wear a catheter for two weeks after having dye.    Hydralazine  Palpitations and Other (See Comments)    Fatigue+   Carvedilol  Other (See Comments)    Bradycardia   Spironolactone  Other (See Comments)    Hyperkalemia

## 2024-02-10 NOTE — Plan of Care (Signed)
 " Problem: Acute Rehab PT Goals(only PT should resolve) Goal: Patient Will Transfer Sit To/From Stand Outcome: Adequate for Discharge Goal: Pt Will Transfer Bed To Chair/Chair To Bed Outcome: Adequate for Discharge Goal: Pt Will Ambulate Outcome: Adequate for Discharge   Problem: Education: Goal: Ability to demonstrate management of disease process will improve Outcome: Adequate for Discharge Goal: Ability to verbalize understanding of medication therapies will improve Outcome: Adequate for Discharge Goal: Individualized Educational Video(s) Outcome: Adequate for Discharge   Problem: Activity: Goal: Capacity to carry out activities will improve Outcome: Adequate for Discharge   Problem: Cardiac: Goal: Ability to achieve and maintain adequate cardiopulmonary perfusion will improve Outcome: Adequate for Discharge   Problem: Education: Goal: Knowledge of General Education information will improve Description: Including pain rating scale, medication(s)/side effects and non-pharmacologic comfort measures Outcome: Adequate for Discharge   Problem: Health Behavior/Discharge Planning: Goal: Ability to manage health-related needs will improve Outcome: Adequate for Discharge   Problem: Clinical Measurements: Goal: Ability to maintain clinical measurements within normal limits will improve Outcome: Adequate for Discharge Goal: Will remain free from infection Outcome: Adequate for Discharge Goal: Diagnostic test results will improve Outcome: Adequate for Discharge Goal: Respiratory complications will improve Outcome: Adequate for Discharge Goal: Cardiovascular complication will be avoided Outcome: Adequate for Discharge   Problem: Activity: Goal: Risk for activity intolerance will decrease Outcome: Adequate for Discharge   Problem: Coping: Goal: Level of anxiety will decrease Outcome: Adequate for Discharge   Problem: Elimination: Goal: Will not experience complications  related to bowel motility Outcome: Adequate for Discharge Goal: Will not experience complications related to urinary retention Outcome: Adequate for Discharge   Problem: Pain Managment: Goal: General experience of comfort will improve and/or be controlled Outcome: Adequate for Discharge   Problem: Safety: Goal: Ability to remain free from injury will improve Outcome: Adequate for Discharge   Problem: Skin Integrity: Goal: Risk for impaired skin integrity will decrease Outcome: Adequate for Discharge   Problem: Malnutrition  (NI-5.2) Goal: Food and/or nutrient delivery Description: Individualized approach for food/nutrient provision. Outcome: Adequate for Discharge   Problem: Consults Goal: Venous Thromboembolism Patient Education Description: See Patient Education Module for education specifics. Outcome: Adequate for Discharge Goal: Skin Care Protocol Initiated - if Braden Score 18 or less Description: If consults are not indicated, leave blank or document N/A Outcome: Adequate for Discharge Goal: Nutrition Consult-if indicated Outcome: Adequate for Discharge Goal: Diabetes Guidelines if Diabetic/Glucose > 140 Description: If diabetic or lab glucose is > 140 mg/dl - Initiate Diabetes/Hyperglycemia Guidelines & Document Interventions  Outcome: Adequate for Discharge   Problem: Phase I Progression Outcomes Goal: Other Phase I Outcomes/Goals Outcome: Adequate for Discharge   Problem: Phase II Progression Outcomes Goal: Therapeutic drug levels for anticoagulation Outcome: Adequate for Discharge Goal: Discharge plan established Outcome: Adequate for Discharge Goal: Tolerating diet Outcome: Adequate for Discharge Goal: Other Phase II Outcomes/Goals Outcome: Adequate for Discharge   Problem: Phase III Progression Outcomes Goal: 02 sats stabilized Outcome: Adequate for Discharge Goal: Activity at appropriate level-compared to baseline Description: (UP IN CHAIR FOR  HEMODIALYSIS) Outcome: Adequate for Discharge Goal: Discharge plan remains appropriate-arrangements made Outcome: Adequate for Discharge Goal: Other Phase III Outcomes/Goals Outcome: Adequate for Discharge   Problem: Discharge Progression Outcomes Goal: Barriers To Progression Addressed/Resolved Outcome: Adequate for Discharge Goal: Discharge plan in place and appropriate Outcome: Adequate for Discharge Goal: Pain controlled with appropriate interventions Outcome: Adequate for Discharge Goal: Hemodynamically stable Outcome: Adequate for Discharge Goal: Complications resolved/controlled Outcome: Adequate for Discharge Goal: Tolerating  diet Outcome: Adequate for Discharge Goal: Activity appropriate for discharge plan Outcome: Adequate for Discharge Goal: Other Discharge Outcomes/Goals Outcome: Adequate for Discharge   "

## 2024-02-10 NOTE — Progress Notes (Signed)
 Patient verbalized difficulty with paying for Eliquis  for his wife, who already has a prescription.  He stated he would need copay assistance or a grant to help pay for this medication.  Notified Dr. Jillian and placed transition of care consult for medication cost concern.  Will continue to address with patient as appropriate pending discharge.

## 2024-02-10 NOTE — Progress Notes (Addendum)
 PHARMACY - ANTICOAGULATION  Pharmacy Consult for Heparin  >> apixaban  Indication: pulmonary embolus  Allergies[1]  Patient Measurements: Height: 5' 10 (177.8 cm) Weight: 68.4 kg (150 lb 12.8 oz) IBW/kg (Calculated) : 73 HEPARIN  DW (KG): 65.3  Vital Signs: Temp: 97.8 F (36.6 C) (01/09 2342) Temp Source: Tympanic (01/09 2342) BP: 155/87 (01/09 2011) Pulse Rate: 65 (01/09 2011)  Labs: Recent Labs    02/08/24 0915 02/08/24 1253 02/09/24 0256 02/09/24 2233 02/10/24 0421  HGB 15.2 15.4 14.3  --  15.4  HCT 46.2 47.8 43.5  --  44.9  PLT 122* 121* 116*  --  127*  HEPARINUNFRC  --   --   --  0.52 0.56  CREATININE 1.21  --  1.34*  --  0.97    Estimated Creatinine Clearance: 60.7 mL/min (by C-G formula based on SCr of 0.97 mg/dL).  Assessment: 79 y.o. male with PE for heparin   Heparin  level therapeutic at 0.56 and CBC stable. No signs/symptoms of bleeding or interruptions to the infusion noted. Will continue.  _________  Addendum: transitioning to DOAC  Goal of Therapy:  Heparin  level 0.3-0.7 units/ml Monitor platelets by anticoagulation protocol: Yes   Plan:  Stop heparin  infusion Start eliquis  10mg  PO BID x7d then 5mg  PO BID thereafter  Daniel [name], PharmD PGY1 Clinical Pharmacist [name]  02/10/2024 7:05 AM          [1]  Allergies Allergen Reactions   Entresto  [Sacubitril -Valsartan ] Hives and Shortness Of Breath   Amlodipine  Swelling    LE swelling   Contrast Media [Iodinated Contrast Media] Other (See Comments)    Prostate problem had to wear a catheter for two weeks after having dye.    Hydralazine  Palpitations and Other (See Comments)    Fatigue+   Carvedilol  Other (See Comments)    Bradycardia   Spironolactone  Other (See Comments)    Hyperkalemia

## 2024-02-10 NOTE — Progress Notes (Signed)
 Mobility Specialist Progress Note:    02/10/24 1200  Mobility  Activity Ambulated with assistance  Level of Assistance Standby assist, set-up cues, supervision of patient - no hands on  Assistive Device None  Distance Ambulated (ft) 250 ft  Activity Response Tolerated well  Mobility Referral Yes  Mobility visit 1 Mobility  Mobility Specialist Start Time (ACUTE ONLY) 1053  Mobility Specialist Stop Time (ACUTE ONLY) 1105  Mobility Specialist Time Calculation (min) (ACUTE ONLY) 12 min   Received pt in bed having no complaints and agreeable to mobility. Pt was asymptomatic throughout ambulation and returned to room w/o fault. Left in bed w/ call bell in reach and all needs met.   Thersia Minder Mobility Specialist  Please contact vis Secure Chat or  Rehab Office [phone]

## 2024-02-10 NOTE — Plan of Care (Addendum)
" °  Problem: Education: Goal: Ability to demonstrate management of disease process will improve Outcome: Progressing   Problem: Education: Goal: Ability to verbalize understanding of medication therapies will improve Outcome: Progressing   Problem: Activity: Goal: Capacity to carry out activities will improve Outcome: Progressing   Problem: Cardiac: Goal: Ability to achieve and maintain adequate cardiopulmonary perfusion will improve Outcome: Progressing   Problem: Education: Goal: Knowledge of General Education information will improve Description: Including pain rating scale, medication(s)/side effects and non-pharmacologic comfort measures Outcome: Progressing   Problem: Clinical Measurements: Goal: Will remain free from infection Outcome: Progressing   Problem: Coping: Goal: Level of anxiety will decrease Outcome: Progressing   "

## 2024-02-10 NOTE — Progress Notes (Signed)
 "  Rounding Note   Patient Name: Daniel [name]. Date of Encounter: 02/10/2024  [name] Cardiologist: Lynwood Schilling, MD   Subjective Wants to go home   Scheduled Meds:  amoxicillin -clavulanate  1 tablet Oral Q12H   aspirin  EC  81 mg Oral Daily   benazepril   20 mg Oral BID   dapagliflozin  propanediol  10 mg Oral Daily   feeding supplement  237 mL Oral BID BM   furosemide   40 mg Intravenous Daily   nicotine   21 mg Transdermal Daily   sodium chloride  flush  3 mL Intravenous Q12H   Continuous Infusions:  heparin  1,100 Units/hr (02/09/24 1653)   PRN Meds: acetaminophen  **OR** acetaminophen , albuterol , ALPRAZolam , bisacodyl , ondansetron  **OR** ondansetron  (ZOFRAN ) IV, polyethylene glycol   Vital Signs  Vitals:   02/09/24 2342 02/10/24 0416 02/10/24 0435 02/10/24 0738  BP:    (!) 144/86  Pulse:    64  Resp: 19  18 18   Temp: 97.8 F (36.6 C)   (!) 97.4 F (36.3 C)  TempSrc: Tympanic   Oral  SpO2:    98%  Weight:  68.4 kg    Height:        Intake/Output Summary (Last 24 hours) at 02/10/2024 0843 Last data filed at 02/10/2024 0416 Gross per 24 hour  Intake --  Output 700 ml  Net -700 ml      02/10/2024    4:16 AM 02/08/2024    8:41 AM 02/08/2024    8:40 AM  Last 3 Weights  Weight (lbs) 150 lb 12.8 oz 152 lb 144 lb  Weight (kg) 68.402 kg 68.947 kg 65.318 kg      Telemetry AV pacing- Personally Reviewed   Physical Exam  GEN: No acute distress.   Neck: No JVD Cardiac: RRR, MR murmurs, rubs, or gallops. PMI enlarged  AICD CRT under left clavicle incision looks fine  Respiratory: Clear to auscultation bilaterally. GI: Soft, nontender, non-distended  MS: 1+ edema Neuro:  Nonfocal  Psych: Normal affect   Labs  Recent Labs  Lab 02/08/24 1253 02/08/24 1425  TRNPT 63* 56*       Chemistry Recent Labs  Lab 02/08/24 0915 02/08/24 1253 02/09/24 0256 02/10/24 0421  NA 139  --  139 134*  K 3.8  --  3.7 4.1  CL 100  --  101 99  CO2 28  --  26  24  GLUCOSE 114*  --  81 78  BUN 27*  --  32* 31*  CREATININE 1.21  --  1.34* 0.97  CALCIUM  9.5  --  8.9 9.0  MG  --  2.4 2.2  --   PROT 6.0*  --  5.3*  --   ALBUMIN  4.0  --  3.4*  --   AST 31  --  28  --   ALT 19  --  16  --   ALKPHOS 79  --  70  --   BILITOT 1.1  --  0.9  --   GFRNONAA >60  --  54* >60  ANIONGAP 11  --  13 11    Hematology Recent Labs  Lab 02/08/24 1253 02/09/24 0256 02/10/24 0421  WBC 5.3 4.5 4.6  RBC 4.77 4.46 4.71  HGB 15.4 14.3 15.4  HCT 47.8 43.5 44.9  MCV 100.2* 97.5 95.3  MCH 32.3 32.1 32.7  MCHC 32.2 32.9 34.3  RDW 15.4 15.3 15.4  PLT 121* 116* 127*    BNP Recent Labs  Lab 02/07/24  1534 02/08/24 0915  BNP >4,200*  --   PROBNP  --  32,929.0*    DDimer  Recent Labs  Lab 02/08/24 0926  DDIMER 3.94*     Radiology  CT Angio Chest Pulmonary Embolism (PE) W or WO Contrast Result Date: 02/09/2024 EXAM: CTA of the Chest with contrast for PE 02/09/2024 01:31:15 PM TECHNIQUE: CTA of the chest was performed after the administration of intravenous contrast. Multiplanar reformatted images are provided for review. MIP images are provided for review. Automated exposure control, iterative reconstruction, and/or weight based adjustment of the mA/kV was utilized to reduce the radiation dose to as low as reasonably achievable. COMPARISON: 02/08/2024 CLINICAL HISTORY: Pulmonary embolism (PE) suspected, high prob. FINDINGS: PULMONARY ARTERIES: Pulmonary arteries are adequately opacified for evaluation. The medial left upper lobe pulmonary artery is thrombosed (axial 44). Main pulmonary artery is normal in caliber. MEDIASTINUM: Left chest maker in place with leads terminating in the right atrium, right ventricle, and coronary sinus. Mild to moderate cardiomegaly. Dense multivessel coronary atherosclerosis. Changes of a prior CABG procedure noted. Sternotomy wires. There is no acute abnormality of the thoracic aorta. Reflux of contrast into the hepatic veins are  consistent with underlying cardiac dysfunction. LYMPH NODES: No mediastinal, hilar or axillary lymphadenopathy. LUNGS AND PLEURA: Moderate centrilobular emphysema. Biapical pleuroparenchymal scarring. Similar focal airspace consolidation along the paramediastinal left upper lobe (axial 21). Unchanged small right pleural effusion with right basilar compressive atelectasis. Small left pleural effusion also present. Diffuse bronchial wall thickening. No pulmonary edema. No pneumothorax. UPPER ABDOMEN: Limited images of the upper abdomen are unremarkable. SOFT TISSUES AND BONES: Osteopenia. Mild multilevel degenerative disc disease of the spine. IMPRESSION: 1. Segmental/subsegmenal pulmonary embolism in the medial left upper lobe. No findings of right heart strain. 2. Moderate centrilobular and paraseptal emphysema. Focal airspace consolidation along the paramediastinal left upper lobe, unchanged, worrisome a superimposed bronchopneumonia. Alternatively, this could represent a region of evolving pulmonary infarct. 3. Unchanged appearance of the small bilateral pleural effusions, larger on the right than the left, with bibasilar compressive atelectasis. 4. Critical Value/emergent results were called by telephone at the time of interpretation on 02/09/2024 2:47 PM to provider Medford HAMS Bedside RN, who verbally acknowledged these results. Electronically signed by: Rogelia Myers MD MD 02/09/2024 02:53 PM EST RP Workstation: [id number]   CT Angio Chest PE W and/or Wo Contrast Result Date: 02/08/2024 CLINICAL DATA:  Shortness of breath EXAM: CT ANGIOGRAPHY CHEST WITH CONTRAST TECHNIQUE: Multidetector CT imaging of the chest was performed using the standard protocol during bolus administration of intravenous contrast. Multiplanar CT image reconstructions and MIPs were obtained to evaluate the vascular anatomy. RADIATION DOSE REDUCTION: This exam was performed according to the departmental dose-optimization program which  includes automated exposure control, adjustment of the mA and/or kV according to patient size and/or use of iterative reconstruction technique. CONTRAST:  75mL OMNIPAQUE  IOHEXOL  350 MG/ML SOLN COMPARISON:  Radiograph of same day FINDINGS: Cardiovascular: Mild cardiomegaly is noted. Coronary artery calcifications are noted. No pericardial effusion. There is no definite evidence of large central pulmonary embolus seen in the main pulmonary artery or the main portions of the left and right pulmonary arteries. There is seen some degree of scatter artifact in the SVC which limits evaluation of the right pulmonary artery. There are noted multiple filling defects in multiple lower lobe branches of both pulmonary arteries, but it is difficult to determine if these actually represent pulmonary emboli or potentially artifact secondary to non opacification of the vessels due to timing of  the contrast bolus. Mediastinum/Nodes: No enlarged mediastinal, hilar, or axillary lymph nodes. Thyroid  gland, trachea, and esophagus demonstrate no significant findings. Lungs/Pleura: Bilateral pleural effusions are noted, right greater than left. No pneumothorax is noted. Probable emphysematous disease is noted. Biapical scarring is noted. 7 mm nodule is noted posteriorly in right upper lobe best seen on image number 60 of series 6. Minimal bibasilar subsegmental atelectasis is noted. Irregular opacity is noted in left lung apex most consistent with pneumonia. Upper Abdomen: No acute abnormality. Musculoskeletal: No chest wall abnormality. No acute or significant osseous findings. Review of the MIP images confirms the above findings. IMPRESSION: 1. There is no definite evidence of large central pulmonary embolus seen in the main pulmonary artery or main portions of the left and right pulmonary arteries. However, there are noted multiple filling defects in multiple lower lobe branches of both pulmonary arteries, but it is difficult to  determine if these actually represent pulmonary emboli or potentially artifact secondary to non opacification of the vessels due to timing of the contrast bolus. Repeat CT pulmonary arteriogram is recommended for further evaluation if patient's renal function allows. 2. Bilateral pleural effusions are noted, right greater than left. 3. 7 mm nodule is noted posteriorly in right upper lobe. Non-contrast chest CT at 6-12 months is recommended. If the nodule is stable at time of repeat CT, then future CT at 18-24 months (from today's scan) is considered optional for low-risk patients, but is recommended for high-risk patients. This recommendation follows the consensus statement: Guidelines for Management of Incidental Pulmonary Nodules Detected on CT Images: From the Fleischner Society 2017; Radiology 2017; 284:228-243. 4. Irregular opacity is noted in left lung apex most consistent with pneumonia. 5. Coronary artery calcifications are noted. 6. Emphysema. Emphysema (ICD10-J43.9). Electronically Signed   By: Lynwood Landy Raddle M.D.   On: 02/08/2024 11:00   DG Chest 1 View Result Date: 02/08/2024 CLINICAL DATA:  Shortness of breath EXAM: CHEST  1 VIEW COMPARISON:  February 07, 2024 FINDINGS: Stable cardiomegaly. Status post coronary bypass graft. Left-sided defibrillator is unchanged. Left lung is clear. Increased right basilar opacity is noted concerning for atelectasis or infiltrate. Bony thorax is unremarkable. IMPRESSION: Increased right basilar opacity is noted concerning for atelectasis or infiltrate. Electronically Signed   By: Lynwood Landy Raddle M.D.   On: 02/08/2024 09:42    Patient Profile    79 year old male with past medical history of coronary artery disease status post coronary bypass and graft, ischemic cardiomyopathy, hyperlipidemia, hypertension, status post ICD with recent upgrade to CRT, prostate cancer, abdominal aortic aneurysm for evaluation of acute on chronic systolic congestive heart failure.  Echocardiogram September 2025 showed ejection fraction 20%, moderate left ventricular enlargement, mild left ventricular hypertrophy, moderate RV dysfunction, moderate biatrial enlargement, moderate to severe mitral regurgitation, mild aortic insufficiency. Patient had CRT upgrade of ICD on December 30. CTA shows no definite evidence of large central pulmonary embolus but multiple filling defects pulmonary emboli versus artifact, bilateral pleural effusions, 7 mm right upper lobe nodule with follow-up recommended in 6 to 12 months.   Assessment & Plan  1 acute on chronic systolic congestive heart failure-volume status seems euvolemic. Change to PO lasix   Continue Farxiga .  Did not tolerate spironolactone  secondary to hyperkalemia. Cr 0.97 and K 4.1 this am    2 ischemic cardiomyopathy-patient apparently did not tolerate Entresto  or BiDil.  Also did not tolerate carvedilol , Toprol  or bisoprolol .  Would resume home dose of benazepril  20 mg twice daily.   3  coronary artery disease status post coronary bypass and graft-would continue aspirin .  He does not tolerate statins.   4 ICD-he is status post CRT upgrade.  Device was interrogated with normal function.   5 hypertension-blood pressure elevated.  ACE resumed improved    6 hyperlipidemia-intolerant to statins and apparently unwilling to try other lipid-lowering medications previously.   7 history of abdominal aortic aneurysm-follow-up as an outpatient.  8.  PE:  LUE site of new AICD with no edema. On heparin  Pharmacy consult to see if DOAC affordable   For questions or updates, please contact [facility] Please consult [email or web address] for contact info under   Signed, Maude Emmer, MD  02/10/2024, 8:43 AM    "

## 2024-02-10 NOTE — Discharge Summary (Signed)
 Physician Discharge Summary  [name] FMW:[id number] DOB: 13-Aug-1945 DOA: 02/08/2024  PCP: Daniel Aleene DEL, MD  Admit date: 02/08/2024 Discharge date: 02/10/2024  Admitted From: Home Disposition:  Home  Discharge Condition:Stable CODE STATUS:FULL Diet recommendation: Heart Healthy    Brief/Interim Summary: Patient is a 79 year old male with history of hypertension, hyperlipidemia, coronary disease, NSTEMI, CHF with EF of 20%, pacemaker implantation who presented with shortness of breath, lower extremity edema.  Recently underwent dual-chamber ICD/pacemaker, insertion of new coronary sinus pacing lead, new biventricular ICD.  On presentation he was hemodynamically stable.  Lab work showed severe elevated proBNP in the range of 30K, EKG showed QTc of 512.  Chest x-ray was concerning for right basilar opacity.  CT angiogram PE was suboptimal but noted to have multiple filling defects in the multiple lower lobe branches of both pulmonary artery, could not rule out PE.  Cardiology consulted.  Given IV Lasix .  He appears near euvolemic today.  CT angiogram showed nonocclusive PE.  Started on Eliquis .  Remains hemodynamically stable for discharge today.  Cardiology cleared for discharge.  Following problems were addressed during the hospitalization:  Acute on chronic HFrEF: Presented with dyspnea, lower EXTR edema.  Last echo on September 2012 showed EF of 20%.  Status post ICD placement.  Elevated BNP.  Started on iv lasix .  Device was interrogated with normal function.  He appears almost euvolemic today.  Continue oral Lasix  on discharge.   History of ischemic cardiomyopathy: Status post recent ICD placement.  Follows with EP   Abnormal CT angiogram study: CT angiogram PE was suboptimal but noted to have multiple filling defects in the multiple lower lobe branches of both pulmonary artery, could not rule out PE.  Did not show large vessel filling defects.  Radiology recommended repeat CT  angiogram test.  Repeat CT angiogram ordered, showed PE.  Started on Eliquis    Pneumonia?:  Patient does not have signs of pneumonia.  No fever ,leukocytosis or hypoxia.  Chest CT showed rregular opacity is noted in left lung apex most consistent with pneumonia.  Started on Augmentin    Coronary disease status post CABG: On aspirin , statin .  Does not tolerate Entresto  or BiDil, beta-blocker.  On benazepril .  No anginal symptoms   History of abdominal aortic aneurysm: Recommend outpatient follow-up   History of hyperlipidemia: Does not tolerate statin.  Not willing to try   Hypertension: On ACE inhibitor   Tobacco use: Continue nicotine  patch   Discharge Diagnoses:  Principal Problem:   SOB (shortness of breath)    Discharge Instructions  Discharge Instructions     Diet - low sodium heart healthy   Complete by: As directed    Discharge instructions   Complete by: As directed    1)Please take your medications as instructed 2)Follow up with your PCP in a week 3)Follow up with cardiology as an outpatient   Increase activity slowly   Complete by: As directed    No wound care   Complete by: As directed       Allergies as of 02/10/2024       Reactions   Entresto  [sacubitril -valsartan ] Hives, Shortness Of Breath   Amlodipine  Swelling   LE swelling   Contrast Media [iodinated Contrast Media] Other (See Comments)   Prostate problem had to wear a catheter for two weeks after having dye.    Hydralazine  Palpitations, Other (See Comments)   Fatigue+   Carvedilol  Other (See Comments)   Bradycardia   Spironolactone  Other (  See Comments)   Hyperkalemia         Medication List     TAKE these medications    albuterol  108 (90 Base) MCG/ACT inhaler Commonly known as: VENTOLIN  HFA Inhale 1-2 puffs into the lungs every 6 (six) hours as needed for wheezing or shortness of breath.   ALPRAZolam  0.25 MG tablet Commonly known as: XANAX  tAKE 1 to 2 tabLETS BY MOUTH AT BEDTIME AS  NEEDED FOR insomnia/restless legs   amoxicillin -clavulanate 875-125 MG tablet Commonly known as: AUGMENTIN  Take 1 tablet by mouth every 12 (twelve) hours for 4 days.   apixaban  5 MG Tabs tablet Commonly known as: ELIQUIS  Take 2 tablets (10 mg total) by mouth 2 (two) times daily for 7 days, THEN 1 tablet (5 mg total) 2 (two) times daily. Start taking on: February 10, 2024   aspirin  EC 81 MG tablet Take 1 tablet (81 mg total) by mouth daily. Swallow whole.   benazepril  20 MG tablet Commonly known as: LOTENSIN  TAKE ONE TABLET BY MOUTH TWICE DAILY   Farxiga  10 MG Tabs tablet Generic drug: dapagliflozin  propanediol Take 10 mg by mouth daily.   furosemide  40 MG tablet Commonly known as: LASIX  Take 1 tablet (40 mg total) by mouth daily. Start taking on: February 11, 2024 What changed:  medication strength how much to take   loratadine  10 MG tablet Commonly known as: CLARITIN  Take 1 tablet (10 mg total) by mouth daily. What changed:  when to take this reasons to take this   nicotine  21 mg/24hr patch Commonly known as: NICODERM CQ  - dosed in mg/24 hours Place 1 patch (21 mg total) onto the skin daily. Start taking on: February 11, 2024   omeprazole 20 MG capsule Commonly known as: PRILOSEC Take 20 mg by mouth daily as needed (for acid reflux).        Follow-up Information     McGowen, Aleene DEL, MD. Schedule an appointment as soon as possible for a visit in 1 week(s).   Specialty: Family Medicine Contact information: [address] [place] [address] [phone]                Allergies[1]  Consultations: Cardiology   Procedures/Studies: CT Angio Chest Pulmonary Embolism (PE) W or WO Contrast Result Date: 02/09/2024 EXAM: CTA of the Chest with contrast for PE 02/09/2024 01:31:15 PM TECHNIQUE: CTA of the chest was performed after the administration of intravenous contrast. Multiplanar reformatted images are provided for review. MIP images are  provided for review. Automated exposure control, iterative reconstruction, and/or weight based adjustment of the mA/kV was utilized to reduce the radiation dose to as low as reasonably achievable. COMPARISON: 02/08/2024 CLINICAL HISTORY: Pulmonary embolism (PE) suspected, high prob. FINDINGS: PULMONARY ARTERIES: Pulmonary arteries are adequately opacified for evaluation. The medial left upper lobe pulmonary artery is thrombosed (axial 44). Main pulmonary artery is normal in caliber. MEDIASTINUM: Left chest maker in place with leads terminating in the right atrium, right ventricle, and coronary sinus. Mild to moderate cardiomegaly. Dense multivessel coronary atherosclerosis. Changes of a prior CABG procedure noted. Sternotomy wires. There is no acute abnormality of the thoracic aorta. Reflux of contrast into the hepatic veins are consistent with underlying cardiac dysfunction. LYMPH NODES: No mediastinal, hilar or axillary lymphadenopathy. LUNGS AND PLEURA: Moderate centrilobular emphysema. Biapical pleuroparenchymal scarring. Similar focal airspace consolidation along the paramediastinal left upper lobe (axial 21). Unchanged small right pleural effusion with right basilar compressive atelectasis. Small left pleural effusion also present. Diffuse bronchial  wall thickening. No pulmonary edema. No pneumothorax. UPPER ABDOMEN: Limited images of the upper abdomen are unremarkable. SOFT TISSUES AND BONES: Osteopenia. Mild multilevel degenerative disc disease of the spine. IMPRESSION: 1. Segmental/subsegmenal pulmonary embolism in the medial left upper lobe. No findings of right heart strain. 2. Moderate centrilobular and paraseptal emphysema. Focal airspace consolidation along the paramediastinal left upper lobe, unchanged, worrisome a superimposed bronchopneumonia. Alternatively, this could represent a region of evolving pulmonary infarct. 3. Unchanged appearance of the small bilateral pleural effusions, larger on the  right than the left, with bibasilar compressive atelectasis. 4. Critical Value/emergent results were called by telephone at the time of interpretation on 02/09/2024 2:47 PM to provider Medford HAMS Bedside RN, who verbally acknowledged these results. Electronically signed by: Rogelia Myers MD MD 02/09/2024 02:53 PM EST RP Workstation: [id number]   CT Angio Chest PE W and/or Wo Contrast Result Date: 02/08/2024 CLINICAL DATA:  Shortness of breath EXAM: CT ANGIOGRAPHY CHEST WITH CONTRAST TECHNIQUE: Multidetector CT imaging of the chest was performed using the standard protocol during bolus administration of intravenous contrast. Multiplanar CT image reconstructions and MIPs were obtained to evaluate the vascular anatomy. RADIATION DOSE REDUCTION: This exam was performed according to the departmental dose-optimization program which includes automated exposure control, adjustment of the mA and/or kV according to patient size and/or use of iterative reconstruction technique. CONTRAST:  75mL OMNIPAQUE  IOHEXOL  350 MG/ML SOLN COMPARISON:  Radiograph of same day FINDINGS: Cardiovascular: Mild cardiomegaly is noted. Coronary artery calcifications are noted. No pericardial effusion. There is no definite evidence of large central pulmonary embolus seen in the main pulmonary artery or the main portions of the left and right pulmonary arteries. There is seen some degree of scatter artifact in the SVC which limits evaluation of the right pulmonary artery. There are noted multiple filling defects in multiple lower lobe branches of both pulmonary arteries, but it is difficult to determine if these actually represent pulmonary emboli or potentially artifact secondary to non opacification of the vessels due to timing of the contrast bolus. Mediastinum/Nodes: No enlarged mediastinal, hilar, or axillary lymph nodes. Thyroid  gland, trachea, and esophagus demonstrate no significant findings. Lungs/Pleura: Bilateral pleural effusions are  noted, right greater than left. No pneumothorax is noted. Probable emphysematous disease is noted. Biapical scarring is noted. 7 mm nodule is noted posteriorly in right upper lobe best seen on image number 60 of series 6. Minimal bibasilar subsegmental atelectasis is noted. Irregular opacity is noted in left lung apex most consistent with pneumonia. Upper Abdomen: No acute abnormality. Musculoskeletal: No chest wall abnormality. No acute or significant osseous findings. Review of the MIP images confirms the above findings. IMPRESSION: 1. There is no definite evidence of large central pulmonary embolus seen in the main pulmonary artery or main portions of the left and right pulmonary arteries. However, there are noted multiple filling defects in multiple lower lobe branches of both pulmonary arteries, but it is difficult to determine if these actually represent pulmonary emboli or potentially artifact secondary to non opacification of the vessels due to timing of the contrast bolus. Repeat CT pulmonary arteriogram is recommended for further evaluation if patient's renal function allows. 2. Bilateral pleural effusions are noted, right greater than left. 3. 7 mm nodule is noted posteriorly in right upper lobe. Non-contrast chest CT at 6-12 months is recommended. If the nodule is stable at time of repeat CT, then future CT at 18-24 months (from today's scan) is considered optional for low-risk patients, but is recommended for high-risk patients.  This recommendation follows the consensus statement: Guidelines for Management of Incidental Pulmonary Nodules Detected on CT Images: From the Fleischner Society 2017; Radiology 2017; 284:228-243. 4. Irregular opacity is noted in left lung apex most consistent with pneumonia. 5. Coronary artery calcifications are noted. 6. Emphysema. Emphysema (ICD10-J43.9). Electronically Signed   By: Lynwood Landy Raddle M.D.   On: 02/08/2024 11:00   DG Chest 2 View Result Date:  02/08/2024 CLINICAL DATA:  Dyspnea on exertion, bilateral lower extremity edema. EXAM: CHEST - 2 VIEW COMPARISON:  January 30, 2024 FINDINGS: Stable cardiomegaly. Status post coronary artery bypass graft. Stable left-sided defibrillator. Left lung is clear. Minimally increased right basilar opacity is noted concerning for slightly increased subsegmental atelectasis, asymmetric edema or possibly inflammation. Minimal right pleural effusion may be present. Bony thorax is unremarkable. IMPRESSION: Minimally increased right basilar opacity is noted concerning for slightly increased subsegmental atelectasis, asymmetric edema or possibly inflammation. Minimal right pleural effusion may be present. Electronically Signed   By: Lynwood Landy Raddle M.D.   On: 02/08/2024 09:44   DG Chest 1 View Result Date: 02/08/2024 CLINICAL DATA:  Shortness of breath EXAM: CHEST  1 VIEW COMPARISON:  February 07, 2024 FINDINGS: Stable cardiomegaly. Status post coronary bypass graft. Left-sided defibrillator is unchanged. Left lung is clear. Increased right basilar opacity is noted concerning for atelectasis or infiltrate. Bony thorax is unremarkable. IMPRESSION: Increased right basilar opacity is noted concerning for atelectasis or infiltrate. Electronically Signed   By: Lynwood Landy Raddle M.D.   On: 02/08/2024 09:42   DG Chest 2 View Result Date: 01/30/2024 CLINICAL DATA:  ICD placement. EXAM: CHEST - 2 VIEW COMPARISON:  Chest radiograph dated 10/23/2023. FINDINGS: Background of emphysema. No focal consolidation, pleural effusion, pneumothorax. Chronic blunting of the costophrenic angles, likely scarring. Stable cardiomegaly. Median sternotomy wires and left pectoral AICD device. No acute osseous pathology. IMPRESSION: 1. No acute cardiopulmonary process. 2. Cardiomegaly. Electronically Signed   By: Vanetta Chou M.D.   On: 01/30/2024 14:54   EP PPM/ICD IMPLANT Result Date: 01/30/2024 Conclusion: Successful removal of the previously  implanted dual-chamber ICD, insertion of a new coronary sinus pacing lead and insertion of a new biventricular ICD in a patient with pacing induced left bundle branch block and chronic systolic heart failure. Danelle Birmingham, MD      Subjective: Patient seen and examined at bedside today.  Hemodynamically stable on room air.  Lower EXTR edema improved today.  On room air.  Denies any shortness of breath or cough.  Eager to go home.  I called and discussed all discharge planning with his son Arvella on phone today.  Discharge Exam: Vitals:   02/10/24 0738 02/10/24 1129  BP: (!) 144/86 127/84  Pulse: 64 70  Resp: 18 18  Temp: (!) 97.4 F (36.3 C)   SpO2: 98% 99%   Vitals:   02/10/24 0416 02/10/24 0435 02/10/24 0738 02/10/24 1129  BP:   (!) 144/86 127/84  Pulse:   64 70  Resp:  18 18 18   Temp:   (!) 97.4 F (36.3 C)   TempSrc:   Oral   SpO2:   98% 99%  Weight: 68.4 kg     Height:        General: Pt is alert, awake, not in acute distress Cardiovascular: RRR, S1/S2 +, no rubs, no gallops Respiratory: CTA bilaterally, no wheezing, no rhonchi Abdominal: Soft, NT, ND, bowel sounds + Extremities: trace bilateral lower extremity edema, no cyanosis    The results of significant  diagnostics from this hospitalization (including imaging, microbiology, ancillary and laboratory) are listed below for reference.     Microbiology: Recent Results (from the past 240 hours)  Blood culture (routine x 2)     Status: None (Preliminary result)   Collection Time: 02/08/24 11:40 AM   Specimen: BLOOD RIGHT ARM  Result Value Ref Range Status   Specimen Description BLOOD RIGHT ARM  Final   Special Requests   Final    BOTTLES DRAWN AEROBIC AND ANAEROBIC Blood Culture adequate volume   Culture   Final    NO GROWTH 2 DAYS Performed at [facility], [address], [place], [place] [address]    Report Status PENDING  Incomplete  Blood culture (routine x 2)     Status: None (Preliminary result)    Collection Time: 02/08/24  6:10 PM   Specimen: BLOOD  Result Value Ref Range Status   Specimen Description BLOOD SITE NOT SPECIFIED  Final   Special Requests   Final    BOTTLES DRAWN AEROBIC AND ANAEROBIC Blood Culture adequate volume   Culture   Final    NO GROWTH 2 DAYS Performed at [facility], [address], [place], [place] [address]    Report Status PENDING  Incomplete     Labs: BNP (last 3 results) Recent Labs    10/23/23 0752 11/03/23 1104 02/07/24 1534  BNP 2,576.0* 2,714.7* >4,200*   Basic Metabolic Panel: Recent Labs  Lab 02/07/24 1534 02/08/24 0915 02/08/24 1253 02/09/24 0256 02/10/24 0421  NA 139 139  --  139 134*  K 4.0 3.8  --  3.7 4.1  CL 100 100  --  101 99  CO2 30 28  --  26 24  GLUCOSE 108* 114*  --  81 78  BUN 26* 27*  --  32* 31*  CREATININE 1.08 1.21  --  1.34* 0.97  CALCIUM  9.6 9.5  --  8.9 9.0  MG  --   --  2.4 2.2  --    Liver Function Tests: Recent Labs  Lab 02/08/24 0915 02/09/24 0256  AST 31 28  ALT 19 16  ALKPHOS 79 70  BILITOT 1.1 0.9  PROT 6.0* 5.3*  ALBUMIN  4.0 3.4*   No results for input(s): LIPASE, AMYLASE in the last 168 hours. No results for input(s): AMMONIA in the last 168 hours. CBC: Recent Labs  Lab 02/08/24 0915 02/08/24 1253 02/09/24 0256 02/10/24 0421  WBC 6.1 5.3 4.5 4.6  HGB 15.2 15.4 14.3 15.4  HCT 46.2 47.8 43.5 44.9  MCV 98.3 100.2* 97.5 95.3  PLT 122* 121* 116* 127*   Cardiac Enzymes: No results for input(s): CKTOTAL, CKMB, CKMBINDEX, TROPONINI in the last 168 hours. BNP: Invalid input(s): POCBNP CBG: Recent Labs  Lab 02/09/24 0725  GLUCAP 88   D-Dimer Recent Labs    02/08/24 0926  DDIMER 3.94*   Hgb A1c No results for input(s): HGBA1C in the last 72 hours. Lipid Profile No results for input(s): CHOL, HDL, LDLCALC, TRIG, CHOLHDL, LDLDIRECT in the last 72 hours. Thyroid  function studies No results for input(s): TSH, T4TOTAL, T3FREE,  THYROIDAB in the last 72 hours.  Invalid input(s): FREET3 Anemia work up No results for input(s): VITAMINB12, FOLATE, FERRITIN, TIBC, IRON, RETICCTPCT in the last 72 hours. Urinalysis    Component Value Date/Time   COLORURINE YELLOW 08/16/2022 1033   APPEARANCEUR Sl Cloudy (A) 08/16/2022 1033   LABSPEC 1.020 08/16/2022 1033   PHURINE 5.5 08/16/2022 1033   GLUCOSEU  500 (A) 08/16/2022 1033   HGBUR TRACE-LYSED (A) 08/16/2022 1033   BILIRUBINUR negative 08/16/2022 1041   BILIRUBINUR NEGATIVE 08/16/2022 1033   KETONESUR NEGATIVE 08/16/2022 1033   PROTEINUR Negative 08/16/2022 1041   PROTEINUR (A) 05/07/2016 1204    TEST NOT REPORTED DUE TO COLOR INTERFERENCE OF URINE PIGMENT   UROBILINOGEN 0.2 08/16/2022 1041   UROBILINOGEN 0.2 08/16/2022 1033   NITRITE Negative 08/16/2022 1041   NITRITE NEGATIVE 08/16/2022 1033   LEUKOCYTESUR Negative 08/16/2022 1041   LEUKOCYTESUR NEGATIVE 08/16/2022 1033   Sepsis Labs Recent Labs  Lab 02/08/24 0915 02/08/24 1253 02/09/24 0256 02/10/24 0421  WBC 6.1 5.3 4.5 4.6   Microbiology Recent Results (from the past 240 hours)  Blood culture (routine x 2)     Status: None (Preliminary result)   Collection Time: 02/08/24 11:40 AM   Specimen: BLOOD RIGHT ARM  Result Value Ref Range Status   Specimen Description BLOOD RIGHT ARM  Final   Special Requests   Final    BOTTLES DRAWN AEROBIC AND ANAEROBIC Blood Culture adequate volume   Culture   Final    NO GROWTH 2 DAYS Performed at [facility], [address], [place], [place] [address]    Report Status PENDING  Incomplete  Blood culture (routine x 2)     Status: None (Preliminary result)   Collection Time: 02/08/24  6:10 PM   Specimen: BLOOD  Result Value Ref Range Status   Specimen Description BLOOD SITE NOT SPECIFIED  Final   Special Requests   Final    BOTTLES DRAWN AEROBIC AND ANAEROBIC Blood Culture adequate volume   Culture   Final    NO GROWTH 2 DAYS Performed at  [facility], [address], [place], [place] [address]    Report Status PENDING  Incomplete    Please note: You were cared for by a hospitalist during your hospital stay. Once you are discharged, your primary care physician will handle any further medical issues. Please note that NO REFILLS for any discharge medications will be authorized once you are discharged, as it is imperative that you return to your primary care physician (or establish a relationship with a primary care physician if you do not have one) for your post hospital discharge needs so that they can reassess your need for medications and monitor your lab values.    Time coordinating discharge: 40 minutes  SIGNED:   Ivonne Mustache, MD  [facility] 02/10/2024, 11:39 AM Pager [phone]  If 7PM-7AM, please contact night-coverage [email or web address] Password [facility] Physician Discharge Summary  [name] FMW:[id number] DOB: 1946-01-30 DOA: 02/08/2024  PCP: Daniel Aleene DEL, MD  Admit date: 02/08/2024 Discharge date: 02/10/2024  Admitted From: Home Disposition:  Home  Discharge Condition:Stable CODE STATUS:FULL, DNR, Comfort Care Diet recommendation: Heart Healthy / Carb Modified / Regular / Dysphagia   Brief/Interim Summary:   Following problems were addressed during the hospitalization:   Discharge Diagnoses:  Principal Problem:   SOB (shortness of breath)    Discharge Instructions  Discharge Instructions     Diet - low sodium heart healthy   Complete by: As directed    Discharge instructions   Complete by: As directed    1)Please take your medications as instructed 2)Follow up with your PCP in a week 3)Follow up with cardiology as an outpatient   Increase activity slowly   Complete by: As directed    No wound care   Complete by: As directed  Allergies as of 02/10/2024       Reactions   Entresto  [sacubitril -valsartan ] Hives, Shortness Of Breath   Amlodipine  Swelling   LE  swelling   Contrast Media [iodinated Contrast Media] Other (See Comments)   Prostate problem had to wear a catheter for two weeks after having dye.    Hydralazine  Palpitations, Other (See Comments)   Fatigue+   Carvedilol  Other (See Comments)   Bradycardia   Spironolactone  Other (See Comments)   Hyperkalemia         Medication List     TAKE these medications    albuterol  108 (90 Base) MCG/ACT inhaler Commonly known as: VENTOLIN  HFA Inhale 1-2 puffs into the lungs every 6 (six) hours as needed for wheezing or shortness of breath.   ALPRAZolam  0.25 MG tablet Commonly known as: XANAX  tAKE 1 to 2 tabLETS BY MOUTH AT BEDTIME AS NEEDED FOR insomnia/restless legs   amoxicillin -clavulanate 875-125 MG tablet Commonly known as: AUGMENTIN  Take 1 tablet by mouth every 12 (twelve) hours for 4 days.   apixaban  5 MG Tabs tablet Commonly known as: ELIQUIS  Take 2 tablets (10 mg total) by mouth 2 (two) times daily for 7 days, THEN 1 tablet (5 mg total) 2 (two) times daily. Start taking on: February 10, 2024   aspirin  EC 81 MG tablet Take 1 tablet (81 mg total) by mouth daily. Swallow whole.   benazepril  20 MG tablet Commonly known as: LOTENSIN  TAKE ONE TABLET BY MOUTH TWICE DAILY   Farxiga  10 MG Tabs tablet Generic drug: dapagliflozin  propanediol Take 10 mg by mouth daily.   furosemide  40 MG tablet Commonly known as: LASIX  Take 1 tablet (40 mg total) by mouth daily. Start taking on: February 11, 2024 What changed:  medication strength how much to take   loratadine  10 MG tablet Commonly known as: CLARITIN  Take 1 tablet (10 mg total) by mouth daily. What changed:  when to take this reasons to take this   nicotine  21 mg/24hr patch Commonly known as: NICODERM CQ  - dosed in mg/24 hours Place 1 patch (21 mg total) onto the skin daily. Start taking on: February 11, 2024   omeprazole 20 MG capsule Commonly known as: PRILOSEC Take 20 mg by mouth daily as needed (for acid  reflux).        Follow-up Information     McGowen, Aleene DEL, MD. Schedule an appointment as soon as possible for a visit in 1 week(s).   Specialty: Family Medicine Contact information: [address] [place] [address] [phone]                Allergies[2]  Consultations:    Procedures/Studies: CT Angio Chest Pulmonary Embolism (PE) W or WO Contrast Result Date: 02/09/2024 EXAM: CTA of the Chest with contrast for PE 02/09/2024 01:31:15 PM TECHNIQUE: CTA of the chest was performed after the administration of intravenous contrast. Multiplanar reformatted images are provided for review. MIP images are provided for review. Automated exposure control, iterative reconstruction, and/or weight based adjustment of the mA/kV was utilized to reduce the radiation dose to as low as reasonably achievable. COMPARISON: 02/08/2024 CLINICAL HISTORY: Pulmonary embolism (PE) suspected, high prob. FINDINGS: PULMONARY ARTERIES: Pulmonary arteries are adequately opacified for evaluation. The medial left upper lobe pulmonary artery is thrombosed (axial 44). Main pulmonary artery is normal in caliber. MEDIASTINUM: Left chest maker in place with leads terminating in the right atrium, right ventricle, and coronary sinus. Mild to moderate cardiomegaly. Dense multivessel coronary atherosclerosis. Changes of  a prior CABG procedure noted. Sternotomy wires. There is no acute abnormality of the thoracic aorta. Reflux of contrast into the hepatic veins are consistent with underlying cardiac dysfunction. LYMPH NODES: No mediastinal, hilar or axillary lymphadenopathy. LUNGS AND PLEURA: Moderate centrilobular emphysema. Biapical pleuroparenchymal scarring. Similar focal airspace consolidation along the paramediastinal left upper lobe (axial 21). Unchanged small right pleural effusion with right basilar compressive atelectasis. Small left pleural effusion also present. Diffuse bronchial wall thickening. No  pulmonary edema. No pneumothorax. UPPER ABDOMEN: Limited images of the upper abdomen are unremarkable. SOFT TISSUES AND BONES: Osteopenia. Mild multilevel degenerative disc disease of the spine. IMPRESSION: 1. Segmental/subsegmenal pulmonary embolism in the medial left upper lobe. No findings of right heart strain. 2. Moderate centrilobular and paraseptal emphysema. Focal airspace consolidation along the paramediastinal left upper lobe, unchanged, worrisome a superimposed bronchopneumonia. Alternatively, this could represent a region of evolving pulmonary infarct. 3. Unchanged appearance of the small bilateral pleural effusions, larger on the right than the left, with bibasilar compressive atelectasis. 4. Critical Value/emergent results were called by telephone at the time of interpretation on 02/09/2024 2:47 PM to provider Medford HAMS Bedside RN, who verbally acknowledged these results. Electronically signed by: Rogelia Myers MD MD 02/09/2024 02:53 PM EST RP Workstation: [id number]   CT Angio Chest PE W and/or Wo Contrast Result Date: 02/08/2024 CLINICAL DATA:  Shortness of breath EXAM: CT ANGIOGRAPHY CHEST WITH CONTRAST TECHNIQUE: Multidetector CT imaging of the chest was performed using the standard protocol during bolus administration of intravenous contrast. Multiplanar CT image reconstructions and MIPs were obtained to evaluate the vascular anatomy. RADIATION DOSE REDUCTION: This exam was performed according to the departmental dose-optimization program which includes automated exposure control, adjustment of the mA and/or kV according to patient size and/or use of iterative reconstruction technique. CONTRAST:  75mL OMNIPAQUE  IOHEXOL  350 MG/ML SOLN COMPARISON:  Radiograph of same day FINDINGS: Cardiovascular: Mild cardiomegaly is noted. Coronary artery calcifications are noted. No pericardial effusion. There is no definite evidence of large central pulmonary embolus seen in the main pulmonary artery or the main  portions of the left and right pulmonary arteries. There is seen some degree of scatter artifact in the SVC which limits evaluation of the right pulmonary artery. There are noted multiple filling defects in multiple lower lobe branches of both pulmonary arteries, but it is difficult to determine if these actually represent pulmonary emboli or potentially artifact secondary to non opacification of the vessels due to timing of the contrast bolus. Mediastinum/Nodes: No enlarged mediastinal, hilar, or axillary lymph nodes. Thyroid  gland, trachea, and esophagus demonstrate no significant findings. Lungs/Pleura: Bilateral pleural effusions are noted, right greater than left. No pneumothorax is noted. Probable emphysematous disease is noted. Biapical scarring is noted. 7 mm nodule is noted posteriorly in right upper lobe best seen on image number 60 of series 6. Minimal bibasilar subsegmental atelectasis is noted. Irregular opacity is noted in left lung apex most consistent with pneumonia. Upper Abdomen: No acute abnormality. Musculoskeletal: No chest wall abnormality. No acute or significant osseous findings. Review of the MIP images confirms the above findings. IMPRESSION: 1. There is no definite evidence of large central pulmonary embolus seen in the main pulmonary artery or main portions of the left and right pulmonary arteries. However, there are noted multiple filling defects in multiple lower lobe branches of both pulmonary arteries, but it is difficult to determine if these actually represent pulmonary emboli or potentially artifact secondary to non opacification of the vessels due to timing of the  contrast bolus. Repeat CT pulmonary arteriogram is recommended for further evaluation if patient's renal function allows. 2. Bilateral pleural effusions are noted, right greater than left. 3. 7 mm nodule is noted posteriorly in right upper lobe. Non-contrast chest CT at 6-12 months is recommended. If the nodule is stable  at time of repeat CT, then future CT at 18-24 months (from today's scan) is considered optional for low-risk patients, but is recommended for high-risk patients. This recommendation follows the consensus statement: Guidelines for Management of Incidental Pulmonary Nodules Detected on CT Images: From the Fleischner Society 2017; Radiology 2017; 284:228-243. 4. Irregular opacity is noted in left lung apex most consistent with pneumonia. 5. Coronary artery calcifications are noted. 6. Emphysema. Emphysema (ICD10-J43.9). Electronically Signed   By: Lynwood Landy Raddle M.D.   On: 02/08/2024 11:00   DG Chest 2 View Result Date: 02/08/2024 CLINICAL DATA:  Dyspnea on exertion, bilateral lower extremity edema. EXAM: CHEST - 2 VIEW COMPARISON:  January 30, 2024 FINDINGS: Stable cardiomegaly. Status post coronary artery bypass graft. Stable left-sided defibrillator. Left lung is clear. Minimally increased right basilar opacity is noted concerning for slightly increased subsegmental atelectasis, asymmetric edema or possibly inflammation. Minimal right pleural effusion may be present. Bony thorax is unremarkable. IMPRESSION: Minimally increased right basilar opacity is noted concerning for slightly increased subsegmental atelectasis, asymmetric edema or possibly inflammation. Minimal right pleural effusion may be present. Electronically Signed   By: Lynwood Landy Raddle M.D.   On: 02/08/2024 09:44   DG Chest 1 View Result Date: 02/08/2024 CLINICAL DATA:  Shortness of breath EXAM: CHEST  1 VIEW COMPARISON:  February 07, 2024 FINDINGS: Stable cardiomegaly. Status post coronary bypass graft. Left-sided defibrillator is unchanged. Left lung is clear. Increased right basilar opacity is noted concerning for atelectasis or infiltrate. Bony thorax is unremarkable. IMPRESSION: Increased right basilar opacity is noted concerning for atelectasis or infiltrate. Electronically Signed   By: Lynwood Landy Raddle M.D.   On: 02/08/2024 09:42   DG Chest 2  View Result Date: 01/30/2024 CLINICAL DATA:  ICD placement. EXAM: CHEST - 2 VIEW COMPARISON:  Chest radiograph dated 10/23/2023. FINDINGS: Background of emphysema. No focal consolidation, pleural effusion, pneumothorax. Chronic blunting of the costophrenic angles, likely scarring. Stable cardiomegaly. Median sternotomy wires and left pectoral AICD device. No acute osseous pathology. IMPRESSION: 1. No acute cardiopulmonary process. 2. Cardiomegaly. Electronically Signed   By: Vanetta Chou M.D.   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 Danelle Birmingham, MD      Subjective:   Discharge Exam: Vitals:   02/10/24 0738 02/10/24 1129  BP: (!) 144/86 127/84  Pulse: 64 70  Resp: 18 18  Temp: (!) 97.4 F (36.3 C)   SpO2: 98% 99%   Vitals:   02/10/24 0416 02/10/24 0435 02/10/24 0738 02/10/24 1129  BP:   (!) 144/86 127/84  Pulse:   64 70  Resp:  18 18 18   Temp:   (!) 97.4 F (36.3 C)   TempSrc:   Oral   SpO2:   98% 99%  Weight: 68.4 kg     Height:        General: Pt is alert, awake, not in acute distress Cardiovascular: RRR, S1/S2 +, no rubs, no gallops Respiratory: CTA bilaterally,  no wheezing, no rhonchi Abdominal: Soft, NT, ND, bowel sounds + Extremities: no edema, no cyanosis    The results of significant diagnostics from this hospitalization (including imaging, microbiology, ancillary and laboratory) are listed below for reference.     Microbiology: Recent Results (from the past 240 hours)  Blood culture (routine x 2)     Status: None (Preliminary result)   Collection Time: 02/08/24 11:40 AM   Specimen: BLOOD RIGHT ARM  Result Value Ref Range Status   Specimen Description BLOOD RIGHT ARM   Final   Special Requests   Final    BOTTLES DRAWN AEROBIC AND ANAEROBIC Blood Culture adequate volume   Culture   Final    NO GROWTH 2 DAYS Performed at [facility], [address], [place], [place] [address]    Report Status PENDING  Incomplete  Blood culture (routine x 2)     Status: None (Preliminary result)   Collection Time: 02/08/24  6:10 PM   Specimen: BLOOD  Result Value Ref Range Status   Specimen Description BLOOD SITE NOT SPECIFIED  Final   Special Requests   Final    BOTTLES DRAWN AEROBIC AND ANAEROBIC Blood Culture adequate volume   Culture   Final    NO GROWTH 2 DAYS Performed at [facility], [address] [place], [place] [address]    Report Status PENDING  Incomplete     Labs: BNP (last 3 results) Recent Labs    10/23/23 0752 11/03/23 1104 02/07/24 1534  BNP 2,576.0* 2,714.7* >4,200*   Basic Metabolic Panel: Recent Labs  Lab 02/07/24 1534 02/08/24 0915 02/08/24 1253 02/09/24 0256 02/10/24 0421  NA 139 139  --  139 134*  K 4.0 3.8  --  3.7 4.1  CL 100 100  --  101 99  CO2 30 28  --  26 24  GLUCOSE 108* 114*  --  81 78  BUN 26* 27*  --  32* 31*  CREATININE 1.08 1.21  --  1.34* 0.97  CALCIUM  9.6 9.5  --  8.9 9.0  MG  --   --  2.4 2.2  --    Liver Function Tests: Recent Labs  Lab 02/08/24 0915 02/09/24 0256  AST 31 28  ALT 19 16  ALKPHOS 79 70  BILITOT 1.1 0.9  PROT 6.0* 5.3*  ALBUMIN  4.0 3.4*   No results for input(s): LIPASE, AMYLASE in the last 168 hours. No results for input(s): AMMONIA in the last 168 hours. CBC: Recent Labs  Lab 02/08/24 0915 02/08/24 1253 02/09/24 0256 02/10/24 0421  WBC 6.1 5.3 4.5 4.6  HGB 15.2 15.4 14.3 15.4  HCT 46.2 47.8 43.5 44.9  MCV 98.3 100.2* 97.5 95.3  PLT 122* 121* 116* 127*   Cardiac Enzymes: No results for input(s): CKTOTAL, CKMB, CKMBINDEX, TROPONINI in the last 168 hours. BNP: Invalid input(s): POCBNP CBG: Recent Labs  Lab 02/09/24 0725  GLUCAP 88    D-Dimer Recent Labs    02/08/24 0926  DDIMER 3.94*   Hgb A1c No results for input(s): HGBA1C in the last 72 hours. Lipid Profile No results for input(s): CHOL, HDL, LDLCALC, TRIG, CHOLHDL, LDLDIRECT in the last 72 hours. Thyroid  function studies No results for input(s): TSH, T4TOTAL, T3FREE, THYROIDAB in the last 72 hours.  Invalid input(s): FREET3 Anemia work up No results for input(s): VITAMINB12, FOLATE, FERRITIN, TIBC, IRON, RETICCTPCT in the last 72 hours. Urinalysis    Component Value Date/Time   COLORURINE YELLOW 08/16/2022 1033  APPEARANCEUR Sl Cloudy (A) 08/16/2022 1033   LABSPEC 1.020 08/16/2022 1033   PHURINE 5.5 08/16/2022 1033   GLUCOSEU 500 (A) 08/16/2022 1033   HGBUR TRACE-LYSED (A) 08/16/2022 1033   BILIRUBINUR negative 08/16/2022 1041   BILIRUBINUR NEGATIVE 08/16/2022 1033   KETONESUR NEGATIVE 08/16/2022 1033   PROTEINUR Negative 08/16/2022 1041   PROTEINUR (A) 05/07/2016 1204    TEST NOT REPORTED DUE TO COLOR INTERFERENCE OF URINE PIGMENT   UROBILINOGEN 0.2 08/16/2022 1041   UROBILINOGEN 0.2 08/16/2022 1033   NITRITE Negative 08/16/2022 1041   NITRITE NEGATIVE 08/16/2022 1033   LEUKOCYTESUR Negative 08/16/2022 1041   LEUKOCYTESUR NEGATIVE 08/16/2022 1033   Sepsis Labs Recent Labs  Lab 02/08/24 0915 02/08/24 1253 02/09/24 0256 02/10/24 0421  WBC 6.1 5.3 4.5 4.6   Microbiology Recent Results (from the past 240 hours)  Blood culture (routine x 2)     Status: None (Preliminary result)   Collection Time: 02/08/24 11:40 AM   Specimen: BLOOD RIGHT ARM  Result Value Ref Range Status   Specimen Description BLOOD RIGHT ARM  Final   Special Requests   Final    BOTTLES DRAWN AEROBIC AND ANAEROBIC Blood Culture adequate volume   Culture   Final    NO GROWTH 2 DAYS Performed at [facility], [address], [place], [place] [address]    Report Status PENDING  Incomplete  Blood culture (routine x 2)      Status: None (Preliminary result)   Collection Time: 02/08/24  6:10 PM   Specimen: BLOOD  Result Value Ref Range Status   Specimen Description BLOOD SITE NOT SPECIFIED  Final   Special Requests   Final    BOTTLES DRAWN AEROBIC AND ANAEROBIC Blood Culture adequate volume   Culture   Final    NO GROWTH 2 DAYS Performed at [facility], [address], [place], [place] [address]    Report Status PENDING  Incomplete    Please note: You were cared for by a hospitalist during your hospital stay. Once you are discharged, your primary care physician will handle any further medical issues. Please note that NO REFILLS for any discharge medications will be authorized once you are discharged, as it is imperative that you return to your primary care physician (or establish a relationship with a primary care physician if you do not have one) for your post hospital discharge needs so that they can reassess your need for medications and monitor your lab values.    Time coordinating discharge: 40 minutes  SIGNED:   Ivonne Mustache, MD  [facility] 02/10/2024, 11:39 AM Pager [phone]  If 7PM-7AM, please contact night-coverage [email or web address] Password [email or web address]    [1]  Allergies Allergen Reactions   Entresto  [Sacubitril -Valsartan ] Hives and Shortness Of Breath   Amlodipine  Swelling    LE swelling   Contrast Media [Iodinated Contrast Media] Other (See Comments)    Prostate problem had to wear a catheter for two weeks after having dye.    Hydralazine  Palpitations and Other (See Comments)    Fatigue+   Carvedilol  Other (See Comments)    Bradycardia   Spironolactone  Other (See Comments)    Hyperkalemia   [2]  Allergies Allergen Reactions   Entresto  [Sacubitril -Valsartan ] Hives and Shortness Of Breath   Amlodipine  Swelling    LE swelling   Contrast Media [Iodinated Contrast Media] Other (See Comments)    Prostate problem had to wear a catheter for two weeks after having dye.     Hydralazine   Palpitations and Other (See Comments)    Fatigue+   Carvedilol  Other (See Comments)    Bradycardia   Spironolactone  Other (See Comments)    Hyperkalemia

## 2024-02-10 NOTE — Progress Notes (Signed)
 RNCM met with patient at bedside and provided Eliquis  card with discounts and Good Rx card.  Patient has [facility] [employer] and not eligible for [facility] program.  Also discussed getting prescriptions at hospital outpatient pharmacy as may be cheaper for patient, but he wants to continue with [facility] at this time.

## 2024-02-11 ENCOUNTER — Encounter: Payer: Self-pay | Admitting: Family Medicine

## 2024-02-12 ENCOUNTER — Telehealth: Payer: Self-pay | Admitting: *Deleted

## 2024-02-12 ENCOUNTER — Ambulatory Visit: Admitting: Family Medicine

## 2024-02-12 NOTE — Transitions of Care (Post Inpatient/ED Visit) (Signed)
" ° °  02/12/2024  Name: Daniel [name]. MRN: [id number] DOB: 1945-02-26  Today's TOC FU Call Status: Today's TOC FU Call Status:: Unsuccessful Call (1st Attempt) Unsuccessful Call (1st Attempt) Date: 02/12/24  Attempted to reach the patient regarding the most recent Inpatient/ED visit.  Follow Up Plan: Additional outreach attempts will be made to reach the patient to complete the Transitions of Care (Post Inpatient/ED visit) call.   Cathlean Headland BSN RN [facility] Health Care Management Coordinator [email or web address] Direct Dial: [phone]  Fax: [phone] Website: [email or web address]  "

## 2024-02-12 NOTE — Progress Notes (Unsigned)
 " Cardiology Office Note:   Date:  02/14/2024  ID:  Daniel [name]., DOB 1945-11-06, MRN [id number] PCP: Candise Aleene DEL, MD  [facility] Providers Cardiologist:  Daniel Schilling, MD Electrophysiologist:  Danelle Birmingham, MD {  History of Present Illness:   Daniel [name]. is a 79 y.o. male who presents for follow up of CAD s/p CABG LIMA to D1, SVG to LAD, SVG to PDA on 05/12/2016, HLD intolerant to statin, HTN, ICM with baseline EF 30-35%, and h/o prostate CA. His last cardiac catheterization on 05/09/2016 showed EF 20-25%, akinesis of the distal inferoapical segment with significant hypokinesis globally consistent with ischemic cardiomyopathy, multivessel disease with 70% followed by total occlusion of large LAD with distal collateral arteries, 50% proximal left circumflex stenosis with total occlusion of distal left circumflex, total occlusion of mid RCA with distal collateral artery. Bypass surgery was recommended and he eventually underwent successful CABG 3 by Dr. Kerrin on 05/12/2016 with LIMA to diagonal, SVG to LAD, SVG to PDA. He developed postoperative atrial fibrillation and was treated with IV amiodarone  with conversion to sinus rhythm prior to discharge. He had a follow-up echocardiogram on 08/05/2016 which revealed LV EF remains low at 30-35% however slightly improved from his previous 20-25%.  He was placed on carvedilol  and Entresto , spironolactone  was discontinued due to elevated potassium level. Unfortunately, he went back to the hospital on 11/20/2016 with shortness breath, diffuse rash and presyncope. He was given prednisone  for his rash. He stopped the Entresto  and restarted ACE inhibitor as he was sure the rash was related to the former.  Last EF in July was 25 - 30%.   He could not take the BiDil.  His blood pressure dropped too low.  He had a low heart rate and we stopped Coreg .  I did manage to get him to take Zebeta  but it turns out he will only take it if his blood pressure  is greater than 140.  He also stop spironolactone  as he thought it was making his heart skip . He now status post ICD.   He returns for follow up.  He was in the hospital and had an ICD generator change.  He was then back in the hospital in late December with volume overload.  I reviewed these records for this visit.  He was also found during that admission to have pulmonary embolism and was placed on Eliquis .  During that admission there was a questionable pneumonia and he was treated with Augmentin .  Because of his intolerance to multiple medications he did not have significant change to his drugs.  His diuresis is limited by some renal insufficiency.  He denies any PND or orthopnea ongoing but he does have continued lower extremity swelling and his weight is up slightly.  He has had no new chest pressure, neck or arm discomfort.  He has had no palpitations, presyncope or syncope.  He is tolerating his anticoagulation.    ROS: As stated in the HPI and negative for all other systems.  Studies Reviewed:    EKG:     NA  Risk Assessment/Calculations:     Physical Exam:   VS:  BP (!) 156/83 (BP Location: Left Arm, Patient Position: Sitting, Cuff Size: Normal)   Pulse 67   Resp 16   Ht 5' 10 (1.778 m)   Wt 156 lb 9.6 oz (71 kg)   SpO2 97%   BMI 22.47 kg/m    Wt Readings from Last 3  Encounters:  02/14/24 156 lb 9.6 oz (71 kg)  02/10/24 150 lb 12.8 oz (68.4 kg)  02/07/24 158 lb (71.7 kg)     GEN: Well nourished, well developed in no acute distress NECK: No JVD; No carotid bruits CARDIAC: RRR, no murmurs, rubs, gallops RESPIRATORY:  Clear to auscultation without rales, wheezing or rhonchi  ABDOMEN: Soft, non-tender, non-distended EXTREMITIES: Moderate bilateral lower extremity edema; No deformity   ASSESSMENT AND PLAN:   CAD s/p CABG:    The patient has no new sypmtoms.  No further cardiovascular testing is indicated.  We will continue with aggressive risk reduction and meds as  listed.  Ischemic cardiomyopathy:   He has not tolerated multiple attempts at med titration.  At this point I do not have a lot of other therapies and he really has not really wanted significant med titration.  I think we are going to mostly manage this symptomatically with volume control.  He understands salt and fluid restriction.  I want him to wear lower extremity compression stockings and he is going to have to get some new ones.  I showed him exactly how when his feet elevated.  I think he is volume up in his legs though his lungs sound clear.  I am going to give him Lasix  80 mg for 4 days and then back down of his 40 mg.  Get up he met in about 10 days.   Hypertension: Blood pressure is elevated but he has not tolerated mutliple meds.  No change in therapy.    Hyperlipidemia:    LDL was not at target but he has not wanted med titration.   ICD: He is status post generator replacement.  No change in therapy.   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  I am going to repeat an abdominal ultrasound.  PE: He will remain on anticoagulation for at least 6 months.       Follow up with me in about 6 weeks.  He is going to see his primary provider and he is going to call for an appointment soon.  Signed, Daniel Schilling, MD   "

## 2024-02-13 ENCOUNTER — Telehealth: Payer: Self-pay | Admitting: *Deleted

## 2024-02-13 ENCOUNTER — Ambulatory Visit

## 2024-02-13 DIAGNOSIS — I5023 Acute on chronic systolic (congestive) heart failure: Secondary | ICD-10-CM

## 2024-02-13 LAB — CULTURE, BLOOD (ROUTINE X 2)
Culture: NO GROWTH
Culture: NO GROWTH
Special Requests: ADEQUATE
Special Requests: ADEQUATE

## 2024-02-13 NOTE — Transitions of Care (Post Inpatient/ED Visit) (Signed)
 "  02/13/2024  Name: Daniel [name]. MRN: [id number] DOB: September 04, 1945  Today's TOC FU Call Status: Today's TOC FU Call Status:: Successful TOC FU Call Completed TOC FU Call Complete Date: 02/13/24  Patient's Name and Date of Birth confirmed. Name, DOB  Transition Care Management Follow-up Telephone Call Date of Discharge: 02/10/24 Discharge Facility: [facility]) Type of Discharge: Inpatient Admission Primary Inpatient Discharge Diagnosis:: SOB (shortness of breath) How have you been since you were released from the hospital?: Better Any questions or concerns?: Yes Patient Questions/Concerns:: Patient was started on Eliquis  and has a started kit. He says he can't afford it after the starter kit runs out Patient Questions/Concerns Addressed: Other: (referral to pharmacy)  Items Reviewed: Did you receive and understand the discharge instructions provided?: Yes Medications obtained,verified, and reconciled?: Yes (Medications Reviewed) Any new allergies since your discharge?: No Dietary orders reviewed?: No Do you have support at home?: Yes People in Home [RPT]: spouse Name of Support/Comfort Primary Source: Brad son and Rock spouse  Medications Reviewed Today: Medications Reviewed Today     Reviewed by Kennieth Cathlean DEL, RN (Case Manager) on 02/13/24 at 1045  Med List Status: <None>   Medication Order Taking? Sig Documenting Provider Last Dose Status Informant  albuterol  (VENTOLIN  HFA) 108 (90 Base) MCG/ACT inhaler [id number] Yes Inhale 1-2 puffs into the lungs every 6 (six) hours as needed for wheezing or shortness of breath. Ricky Fines, MD  Active Self  ALPRAZolam  (XANAX ) 0.25 MG tablet [id number] Yes tAKE 1 to 2 tabLETS BY MOUTH AT BEDTIME AS NEEDED FOR insomnia/restless legs McGowen, Aleene DEL, MD  Active Self  amoxicillin -clavulanate (AUGMENTIN ) 875-125 MG tablet [id number] Yes Take 1 tablet by mouth every 12 (twelve) hours for 4 days. Jillian Buttery, MD  Active    APIXABAN  (ELIQUIS ) VTE STARTER PACK (10MG  AND 5MG ) [id number] Yes Take 10 mg by mouth 2 (two) times daily for 7 days, THEN 5 mg 2 (two) times daily. Jillian Buttery, MD  Active   aspirin  EC 81 MG tablet [id number] Yes Take 1 tablet (81 mg total) by mouth daily. Swallow whole. Lavona Lynwood, MD  Active Self           Med Note JACKOLYN WADDELL DEL [place] Feb 08, 2024 12:57 PM) Chewed 4 this morning. Typically takes once daily.  benazepril  (LOTENSIN ) 20 MG tablet [id number] Yes TAKE ONE TABLET BY MOUTH TWICE DAILY McGowen, Aleene DEL, MD  Active Self    Discontinued 11/06/14 1929   dapagliflozin  propanediol (FARXIGA ) 10 MG TABS tablet [id number]  Take 10 mg by mouth daily. [provider]  Active Self  furosemide  (LASIX ) 40 MG tablet [id number] Yes Take 1 tablet (40 mg total) by mouth daily. Jillian Buttery, MD  Active   loratadine  (CLARITIN ) 10 MG tablet [id number] Yes Take 1 tablet (10 mg total) by mouth daily.  Patient taking differently: Take 10 mg by mouth daily as needed for allergies.   Sherlynn Madden, MD  Active Self  nicotine  (NICODERM CQ  - DOSED IN MG/24 HOURS) 21 mg/24hr patch [id number] Yes Place 1 patch (21 mg total) onto the skin daily. Jillian Buttery, MD  Active   omeprazole (PRILOSEC) 20 MG capsule [id number] Yes Take 20 mg by mouth daily as needed (for acid reflux). [provider]  Active Self            Home Care and Equipment/Supplies: Were Home Health Services Ordered?: NA Any new equipment or medical supplies ordered?: NA  Functional Questionnaire: Do you  need assistance with bathing/showering or dressing?: No Do you need assistance with meal preparation?: No Do you need assistance with eating?: No Do you have difficulty maintaining continence: No Do you need assistance with getting out of bed/getting out of a chair/moving?: No Do you have difficulty managing or taking your medications?: No  Follow up appointments reviewed: PCP Follow-up  appointment confirmed?: Yes Date of PCP follow-up appointment?: 03/22/24 Follow-up Provider: Dr Candise (Patient is calling forr an earlier f/u appt) Specialist Hospital Follow-up appointment confirmed?: Yes Date of Specialist follow-up appointment?: 02/14/24 Follow-Up Specialty Provider:: Dr Lavona Do you need transportation to your follow-up appointment?: No  SDOH Interventions Today    Flowsheet Row Most Recent Value  SDOH Interventions   Food Insecurity Interventions Intervention Not Indicated  Housing Interventions Intervention Not Indicated  Transportation Interventions Intervention Not Indicated  Utilities Interventions Intervention Not Indicated    Goals Addressed             This Visit's Progress    VBCI Transitions of Care (TOC) Care Plan       Problems:  Recent Hospitalization for treatment of CHF and Shortness of breath Knowledge Deficit Related to Shortness of breath, CHF  Goal:  Over the next 30 days, the patient will not experience hospital readmission  Interventions:   Heart Failure Interventions: Provided education on low sodium diet Reviewed role of diuretics in prevention of fluid overload and management of heart failure; Discussed the importance of keeping all appointments with provider  Patient Self Care Activities:  Attend all scheduled provider appointments Call pharmacy for medication refills 3-7 days in advance of running out of medications Call provider office for new concerns or questions  Notify RN Care Manager of TOC call rescheduling needs Participate in Transition of Care Program/Attend TOC scheduled calls Perform all self care activities independently  Perform IADL's (shopping, preparing meals, housekeeping, managing finances) independently Take medications as prescribed   Work with the pharmacist to address medication management needs and will continue to work with the clinical team to address health care and disease management  related needs to obtain Eliquis   Take all antibiotics as per ordered.  Follow up with Smoking cessation utilizing Nicoderm   Plan:  An initial telephone outreach has been scheduled for: 02/20/2024 Follow up with provider re: inability to afford Eliquis  Next PCP appointment scheduled for: 03/22/2024. ( Patient is calling PCP for earlier appointment Telephone follow up appointment with care management team member scheduled for:  [id number] Alan Ee 1:00       Discussed and offered 30 day TOC program.  Patient   consented. The patient has been provided with contact information for the care management team and has been advised to call with any health -related questions or concerns.  The patient verbalized understanding with current plan of care.  The patient is directed to their insurance card regarding availability of benefits coverage   RN discussed completing antibiotics  Referral to pharmacy for Eliquis   Cathlean Headland BSN RN [facility] Health Care Management Coordinator [email or web address] Direct Dial: [phone]  Fax: [phone] Website: [email or web address]  "

## 2024-02-14 ENCOUNTER — Encounter: Payer: Self-pay | Admitting: Cardiology

## 2024-02-14 ENCOUNTER — Ambulatory Visit: Admitting: Cardiology

## 2024-02-14 VITALS — BP 156/83 | HR 67 | Resp 16 | Ht 70.0 in | Wt 156.6 lb

## 2024-02-14 DIAGNOSIS — I2510 Atherosclerotic heart disease of native coronary artery without angina pectoris: Secondary | ICD-10-CM | POA: Diagnosis not present

## 2024-02-14 DIAGNOSIS — E785 Hyperlipidemia, unspecified: Secondary | ICD-10-CM | POA: Diagnosis not present

## 2024-02-14 DIAGNOSIS — Z95810 Presence of automatic (implantable) cardiac defibrillator: Secondary | ICD-10-CM | POA: Diagnosis not present

## 2024-02-14 DIAGNOSIS — Z79899 Other long term (current) drug therapy: Secondary | ICD-10-CM

## 2024-02-14 DIAGNOSIS — I714 Abdominal aortic aneurysm, without rupture, unspecified: Secondary | ICD-10-CM | POA: Diagnosis not present

## 2024-02-14 DIAGNOSIS — I255 Ischemic cardiomyopathy: Secondary | ICD-10-CM

## 2024-02-14 NOTE — Patient Instructions (Addendum)
 Medication Instructions:  Your physician has recommended you make the following change in your medication:  Take an additional furosemide  40 mg tablet daily for 4 days; then resume previous directions of 40 mg daily Continue all other medications as prescribed  Labwork: BMET in 10 days around 02/26/2024  Testing/Procedures: Your physician has requested that you have an ankle brachial index (ABI). During this test an ultrasound and blood pressure cuff are used to evaluate the arteries that supply the arms and legs with blood. Allow thirty minutes for this exam. There are no restrictions or special instructions.  Please note: We ask at that you not bring children with you during ultrasound (echo/ vascular) testing. Due to room size and safety concerns, children are not allowed in the ultrasound rooms during exams. Our front office staff cannot provide observation of children in our lobby area while testing is being conducted. An adult accompanying a patient to their appointment will only be allowed in the ultrasound room at the discretion of the ultrasound technician under special circumstances. We apologize for any inconvenience.  Follow-Up: Your physician recommends that you schedule a follow-up appointment in: 6 weeks  Any Other Special Instructions Will Be Listed Below (If Applicable).  If you need a refill on your cardiac medications before your next appointment, please call your pharmacy.

## 2024-02-15 ENCOUNTER — Telehealth: Payer: Self-pay

## 2024-02-15 NOTE — Progress Notes (Signed)
 Care Guide Pharmacy Note  02/15/2024 Name: [name]. MRN: [id number] DOB: [birth year]  Referred By: Candise Aleene DEL, MD Reason for referral: Complex Care Management (Outreach to schedule with pharm d )   [name] is a 79 y.o. year old male who is a primary care patient of McGowen, Aleene DEL, MD.  [name] was referred to the pharmacist for assistance related to: CHF  Successful contact was made with the patient to discuss pharmacy services including being ready for the pharmacist to call at least 5 minutes before the scheduled appointment time and to have medication bottles and any blood pressure readings ready for review. The patient agreed to meet with the pharmacist via telephone visit on (date/time).02/20/2024  Jeoffrey Buffalo , RMA     [place]  [facility], [facility] Guide  Direct Dial: [phone]  Website: [email or web address]

## 2024-02-20 ENCOUNTER — Other Ambulatory Visit: Payer: Self-pay | Admitting: Pharmacist

## 2024-02-20 ENCOUNTER — Other Ambulatory Visit: Payer: Self-pay

## 2024-02-20 ENCOUNTER — Telehealth: Payer: Self-pay | Admitting: Pharmacist

## 2024-02-20 DIAGNOSIS — Z716 Tobacco abuse counseling: Secondary | ICD-10-CM

## 2024-02-20 DIAGNOSIS — I5022 Chronic systolic (congestive) heart failure: Secondary | ICD-10-CM

## 2024-02-20 DIAGNOSIS — Z79899 Other long term (current) drug therapy: Secondary | ICD-10-CM

## 2024-02-20 DIAGNOSIS — I509 Heart failure, unspecified: Secondary | ICD-10-CM

## 2024-02-20 MED ORDER — APIXABAN 5 MG PO TABS: 5.0000 mg | ORAL_TABLET | Freq: Two times a day (BID) | ORAL | 1 refills | Status: AC

## 2024-02-20 NOTE — Progress Notes (Signed)
 "  02/20/2024 Name: Daniel [name]. MRN: [id number] DOB: 1945/08/13  Chief Complaint  Patient presents with   Medication Management    Eliquis  cost    [name]. is a 79 y.o. year old male who presented for a telephone visit.   They were referred to the pharmacist by their PCP for assistance in managing medication access.    Subjective:  Care Team: Primary Care Provider: Candise Aleene DEL, MD ; Next Scheduled Visit: 02/22/2024 Cardiologist: Dr Lavona; Next Scheduled Visit: not currently scheduled - last visit was 02/14/2024  Medication Access/Adherence  Current Pharmacy:  [facility] [place], [place] - [address] [place] [address] Phone: [phone] Fax: [phone]  [id number] - [place], [place] - [address] [place] [address] Phone: [phone] Fax: [phone]   Patient reports affordability concerns with their medications: Yes  Patient reports access/transportation concerns to their pharmacy: No  Patient reports adherence concerns with their medications:  No      Pulmonary Embolism / CHF / Ischemic cardiomyopathy:  Current medication: benazepril  20mg  twice a day, Farxiga  10mg  daily and furosemide  40mg  daily  Anticoagulation Regimen: Eliquis  5mg  twice daily - started 02/10/2024 after PE  Past medications tried: Entresto  - shortness of breath and hives; spironolactone  - stopped due to hyperkalemia; carvedilol  - stopped due to bradycardia.   Current medication access support: [employer] Grant fro cardiomyopathy thru cardiology office but when I called pharamcy about recent Farxiga  / dapagliflozin  fill 02/14/2024 they did not have most up to date [employer] information. [facility] tried to fill with info provided but received a rejection that card was on hold.   [employer] ID: [id number] Patient: Daniel [name] Status: Approved Start Date: 11/30/2023 End Date: 11/28/2024 Assistance Type: Co-pay Grant  Balance: $7500.00  Pharmacy Card Card No. : [id number] BIN: [id number] PCN: [id number] PC Group: [id number] Help Desk: [phone]   Tobacco Abuse:  Tobacco Use History: Age when started using tobacco on a daily basis - 79 years old Number of cigarettes per day 1 or 2 per day now; he has smoked more - just under a pack per day in past Smokes first cigarette 60 minutes after waking Does not wake at night to smoke Triggers include - after meals  Quit Attempt History: Most recent quit attempt - trying to cut back / quit now Longest time ever been tobacco free - several weeks Methods tried in the past include None.  Motivators to quitting include heart issues / health; barriers include fear of weight gain   Current medication access support: none   Objective:  Lab Results  Component Value Date   HGBA1C 5.2 01/02/2020    Lab Results  Component Value Date   CREATININE 0.97 02/10/2024   BUN 31 (H) 02/10/2024   NA 134 (L) 02/10/2024   K 4.1 02/10/2024   CL 99 02/10/2024   CO2 24 02/10/2024    Lab Results  Component Value Date   CHOL 156 05/24/2023   HDL 38.40 (L) 05/24/2023   LDLCALC 106 (H) 05/24/2023   TRIG 61.0 05/24/2023   CHOLHDL 4 05/24/2023    Medications Reviewed Today     Reviewed by Carla Milling, RPH-CPP (Pharmacist) on 02/20/24 at [phone]  Med List Status: <None>   Medication Order Taking? Sig Documenting Provider Last Dose Status Informant  albuterol  (VENTOLIN  HFA) 108 (90 Base) MCG/ACT inhaler [id number] Yes Inhale 1-2 puffs into the lungs every 6 (six) hours  as needed for wheezing or shortness of breath. Ricky Fines, MD  Active Self  ALPRAZolam  (XANAX ) 0.25 MG tablet [id number] Yes tAKE 1 to 2 tabLETS BY MOUTH AT BEDTIME AS NEEDED FOR insomnia/restless legs  Patient taking differently: Take 0.5 tablets by mouth at bedtime as needed. tAKE 1 to 2 tabLETS BY MOUTH AT BEDTIME AS NEEDED FOR insomnia/restless legs   McGowen, Aleene DEL, MD  Active Self  APIXABAN   (ELIQUIS ) VTE STARTER PACK (10MG  AND 5MG ) [id number] Yes Take 10 mg by mouth 2 (two) times daily for 7 days, THEN 5 mg 2 (two) times daily. Jillian Buttery, MD  Active   benazepril  (LOTENSIN ) 20 MG tablet [id number] Yes TAKE ONE TABLET BY MOUTH TWICE DAILY McGowen, Aleene DEL, MD  Active Self    Discontinued 11/06/14 1929   dapagliflozin  propanediol (FARXIGA ) 10 MG TABS tablet [id number] Yes Take 10 mg by mouth daily. [provider]  Active Self  furosemide  (LASIX ) 40 MG tablet [id number] Yes Take 1 tablet (40 mg total) by mouth daily. Jillian Buttery, MD  Active   loratadine  (CLARITIN ) 10 MG tablet [id number] Yes Take 1 tablet (10 mg total) by mouth daily.  Patient taking differently: Take 10 mg by mouth daily as needed for allergies.   Sherlynn Madden, MD  Active Self  nicotine  (NICODERM CQ  - DOSED IN MG/24 HOURS) 21 mg/24hr patch [id number]  Place 1 patch (21 mg total) onto the skin daily.  Patient not taking: Reported on 02/20/2024   Jillian Buttery, MD  Active   omeprazole (PRILOSEC) 20 MG capsule [id number] Yes Take 20 mg by mouth daily as needed (for acid reflux). [provider]  Active Self              Assessment/Plan:   Heart Failure: Currently appropriately managed - Reviewed to weigh daily and when to contact cardiology with weight gain - Recommend to continue current medications - benazepril , furosemide  and   - Called [employer] to see what is needed to remove hold on his pharmacy card.    Tobacco Abuse Currently still smoking 1 or 2 cigarettes per day but he is actively trying to decrease / quit smoking - Provided motivational interviewing to assess tobacco use and strategies for reduction - Patient declined pharmacotherapy to help with tobacco use at this time.  - Discussed tips to help him quit smoking and break habits that increase his cravings to smoke.    Medication Management: - Checked his 2026 Medicare plan. He has a $520 deductible. Both  Farxiga  / dapagliflozin  and Eliquis  are tier 3 medications which means his copay would be 15% of medication cost.   Farxiga  / dapagliflozin  cost before deductible $183 / cost after deductible $28 for 30 days  Eliquis  - cost before deductible $250 / cost after deductible $38 for 30 days - Working on getting his pharmacy information about [employer] - this grant should cover the cost of both Eliquis  and Farxiga  thru 10/2024 so patient's portion would be $0. Called [facility] and provided [employer] information but they were not able to get [place] to go thru for Farxiga . Conference call with [employer] and [facility], we were able to resolve issue and patient will get Farxiga  for $0.  - Patient should also be able to use [employer] Grant for Eliquis . He will need Rx for Eliquis  since it is a new medications. Will request from PCP. Next appt with PCP is 02/22/2024.   Follow Up Plan: 1 week to check on cost of Eliquis  and  make sure was processed with [employer] Card.   Madelin Ray, PharmD Clinical Pharmacist [facility] Primary Care  Population Health [phone]    "

## 2024-02-20 NOTE — Patient Instructions (Signed)
 Visit Information  Thank you for taking time to visit with me today. Please don't hesitate to contact me if I can be of assistance to you before our next scheduled telephone appointment.  Following are the goals we discussed today:   Goals Addressed             This Visit's Progress    VBCI Transitions of Care (TOC) Care Plan       Problems:  Recent Hospitalization for treatment of CHF and Shortness of breath  02/20/2024  140 pounds, no shortness of breath.  Following low salt diet.  Knowledge Deficit Related to Shortness of breath, CHF  Goal:  Over the next 30 days, the patient will not experience hospital readmission  Interventions:  Heart Failure Interventions: Provided education on low sodium diet Reviewed role of diuretics in prevention of fluid overload and management of heart failure; Discussed the importance of keeping all appointments with provider Reviewed importance of daily weights and recording of weights. Reviewed heart failure zones and when to call MD. Reviewed risk of bleeding. Reviewed pharmacist note about medication assistance.   Patient Self Care Activities:  Attend all scheduled provider appointments Call pharmacy for medication refills 3-7 days in advance of running out of medications Call provider office for new concerns or questions  Notify RN Care Manager of TOC call rescheduling needs Participate in Transition of Care Program/Attend TOC scheduled calls Perform all self care activities independently  Perform IADL's (shopping, preparing meals, housekeeping, managing finances) independently Take medications as prescribed   Follow up with Smoking cessation utilizing Nicoderm- denies using Nicoderm  patches. Smokes 2 cigarettes per day.   Plan:  A telephone outreach has been scheduled for: 02/27/2024 with Alan Ee RN          Our next appointment is by telephone on 02/27/2024 at 1pm  Please call the care guide team at [phone] if you need  to cancel or reschedule your appointment.   If you are experiencing a Mental Health or Behavioral Health Crisis or need someone to talk to, please call the Suicide and Crisis Lifeline: 988 call the [place]  National Suicide Prevention Lifeline: 430-663-2553 or TTY: 534-196-8097 TTY 580-556-4698) to talk to a trained counselor call 1-800-273-TALK (toll free, 24 hour hotline) call 911   Care plan and visit instructions communicated with the patient verbally today. Patient agrees to receive a copy in MyChart. Active MyChart status and patient understanding of how to access instructions and care plan via MyChart confirmed with patient.     Alan Ee, RN, BSN, CEN [employer]- Transition of Care Team.  [facility] [phone]

## 2024-02-20 NOTE — Telephone Encounter (Signed)
 Patient was started on Eliquis  5mg  started pack after diagnosis of PE 02/10/2024. He was concerned about cost but he has a [employer] which should cover the cost of Eliquis .  He will need a prescription for Eliquis  - he has appointment with you 02/22/2024 for hospital follow up.

## 2024-02-20 NOTE — Transitions of Care (Post Inpatient/ED Visit) (Signed)
 " Transition of Care week 2  Visit Note  02/20/2024  Name: Daniel [name]. MRN: [id number]          DOB: 04-08-1945  Situation: Patient enrolled in [facility] 30-day program. Visit completed with patients by telephone.   Background:   Initial Transition Care Management Follow-up Telephone Call Discharge Date and Diagnosis: 02/10/24, SOB (shortness of breath)   Past Medical History:  Diagnosis Date   Abdominal aortic aneurysm 01/2021   4 cm, infrarenal.  Also enlarged left common iliac artery--vascular eval 03/2021->rpt aortic u/s 06/01/22 stable Aorta 4.2 cm.  Bilat common iliac aneurisms and left common femoral aneurism-->f/u 1 yr   Acute prostatitis 06/19/2013   Atrial fibrillation [facility])    Limited episode, emergency room, June, 2013, spontaneous conversion to sinus rhythm, was evaluated By Dr. Micky 2013- no further visits.  Post-op (CABG) a-fib, converted on amio but pt self d/c'd this med due to DOE/side effect profile.   Bradycardia 06/20/2017   CAD (coronary artery disease) 05/2016   COPD (chronic obstructive pulmonary disease) (HCC) fall 2016   Bullous changes noted on lower lung images of CT abd done by urology   Double vision 01/2020   MG ab w/u NEG.  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 Unable to have MRI due to having shrapnel in forehead.   History of diverticulitis    Hyperlipidemia    statin intolerant. Pt declined advanced lipid clinic referral 08/2019, 03/2020, and 01/2021.   Hypertension    Ischemic cardiomyopathy 05/2016   EF 20-25% at the time of NSTEMI/CABG.  Repeat EF 07/2016 30-35%.  07/2017 EF 25-30%, pt intol of entresto , bidil, and BB--for ICD 03/2019.    Microscopic hematuria Fall 2016   CT nl except nonobstructing stones.  Cystoscopy normal 12/30/14 (Dr. 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  Urol (Dr. Beola) at [facility]; Dr. Beola referred him to Dr. Renda 03/2015: patient got bilat nerve sparing , robot assisted laparoscopic radical prostatectomy and pelvic lymphadenectomy.  Urol f/u, PSA surveillance showing very mild uptrend of PSA but not to the level of biochemical recurrence as of 05/2021 urology follow-up   Pulmonary emboli (HCC)    02/2024->starte on eliquis  in hosp   RLS (restless legs syndrome)    Statin intolerance    Tobacco dependence    down to 1-2 cigs per day as of 11/2015.  Restarted 08/2016.   Urinary retention 05/2015   occurred s/p foley removal    Assessment:  Patient reports that he is feeling good.  Reports that his weight is back to normal for him.  Patient reports that he weighs in the morning and at night but goes by is morning readings.  Has follow up planned with PCP this week, Reports that he is taking his medications as prescribed. Reviewed heart failure zones and when to all MD.  Denies any new problems or concerns today.  Patient Reported Symptoms: Cognitive  Cognitive Status: Able to follow simple commands, Alert and oriented to person, place, and time, Normal speech and language skills      Neurological Neurological Review of Symptoms: No symptoms reported    HEENT HEENT Symptoms Reported: No symptoms reported      Cardiovascular Cardiovascular Symptoms Reported: Swelling in legs or feet Other Cardiovascular Symptoms: right leg swelling from vein surgery. Does patient have uncontrolled Hypertension?: No Cardiovascular Management Strategies: Medical device, Medication therapy,  Routine screening Weight: 140 lb (63.5 kg) Cardiovascular Self-Management Outcome: 5 (very good) Cardiovascular Comment: reports that he is back to his baseline weight  Respiratory Respiratory Symptoms Reported: No symptoms reported Respiratory Management Strategies: Routine screening  Endocrine Endocrine Symptoms Reported: No symptoms reported Is patient diabetic?: No Endocrine Self-Management Outcome: 5 (very good)  Gastrointestinal Gastrointestinal Symptoms Reported: No symptoms reported Additional Gastrointestinal Details: LBM - today- normal Gastrointestinal Self-Management Outcome: 5 (very good)    Genitourinary Genitourinary Symptoms Reported: No symptoms reported Genitourinary Self-Management Outcome: 5 (very good)  Integumentary Integumentary Symptoms Reported: Bruising Additional Integumentary Details: reports skin is healed. Reports that he continues to have steri strips Skin Management Strategies: Routine screening  Musculoskeletal Musculoskelatal Symptoms Reviewed: No symptoms reported        Psychosocial Psychosocial Symptoms Reported: Not assessed         Today's Vitals   02/20/24 1303  BP: 139/78  Pulse: 72  Weight: 140 lb (63.5 kg)   Pain Score: 0-No pain  Medications Reviewed Today     Reviewed by Rumalda Alan PENNER, RN (Registered Nurse) on 02/20/24 at 1303  Med List Status: <None>   Medication Order Taking? Sig Documenting Provider Last Dose Status Informant  albuterol  (VENTOLIN  HFA) 108 (90 Base) MCG/ACT inhaler [id number] Yes Inhale 1-2 puffs into the lungs every 6 (six) hours as needed for wheezing or shortness of breath. Ricky Fines, MD  Active Self  ALPRAZolam  (XANAX ) 0.25 MG tablet [id number] Yes tAKE 1 to 2 tabLETS BY MOUTH AT BEDTIME AS NEEDED FOR insomnia/restless legs McGowen, Aleene DEL, MD  Active Self  apixaban  (ELIQUIS ) 5 MG TABS tablet [id number] Yes Take 1 tablet (5 mg total) by mouth 2 (two) times daily. McGowen, Philip H, MD  Active      Discontinued 02/20/24 1255   benazepril  (LOTENSIN ) 20 MG tablet [id number] Yes TAKE ONE TABLET BY MOUTH TWICE DAILY Candise Aleene DEL, MD  Active Self    Discontinued 11/06/14 1929   dapagliflozin  propanediol (FARXIGA ) 10 MG TABS tablet [id number] Yes Take 10 mg by mouth daily. [provider]  Active Self  furosemide  (LASIX ) 40 MG tablet [id number] Yes Take 1 tablet (40 mg total) by mouth daily. Jillian Buttery, MD  Active   loratadine  (CLARITIN ) 10 MG tablet [id number] Yes Take 1 tablet (10 mg total) by mouth daily. Sherlynn Madden, MD  Active Self  nicotine  (NICODERM CQ  - DOSED IN MG/24 HOURS) 21 mg/24hr patch [id number]  Place 1 patch (21 mg total) onto the skin daily.  Patient not taking: Reported on 02/20/2024   Jillian Buttery, MD  Active   omeprazole (PRILOSEC) 20 MG capsule [id number] Yes Take 20 mg by mouth daily as needed (for acid reflux). [provider]  Active Self            Goals Addressed             This Visit's Progress    [facility] Transitions of Care (TOC) Care Plan       Problems:  Recent Hospitalization for treatment of  CHF and Shortness of breath  02/20/2024  140 pounds, no shortness of breath.  Following low salt diet.  Knowledge Deficit Related to Shortness of breath, CHF  Goal:  Over the next 30 days, the patient will not experience hospital readmission  Interventions:  Heart Failure Interventions: Provided education on low sodium diet Reviewed role of diuretics in prevention of fluid overload and management of heart failure; Discussed the importance of keeping all appointments with provider Reviewed importance of daily weights and recording of weights. Reviewed heart failure zones and when to call MD. Reviewed risk of bleeding. Reviewed pharmacist note about medication assistance.   Patient Self Care Activities:  Attend all scheduled provider appointments Call pharmacy for medication refills 3-7 days in advance of running out of  medications Call provider office for new concerns or questions  Notify RN Care Manager of TOC call rescheduling needs Participate in Transition of Care Program/Attend TOC scheduled calls Perform all self care activities independently  Perform IADL's (shopping, preparing meals, housekeeping, managing finances) independently Take medications as prescribed   Follow up with Smoking cessation utilizing Nicoderm- denies using Nicoderm  patches. Smokes 2 cigarettes per day.   Plan:  A telephone outreach has been scheduled for: 02/27/2024 with Alan Ee RN         Recommendation:   Continue Current Plan of Care  Follow Up Plan:   Telephone follow up appointment date/time:  02/27/2024 at 1pm  Alan Ee, RN, BSN, CEN Population Health- Transition of Care Team.  [facility] [phone]     "

## 2024-02-22 ENCOUNTER — Ambulatory Visit: Admitting: Family Medicine

## 2024-02-22 ENCOUNTER — Encounter: Payer: Self-pay | Admitting: Family Medicine

## 2024-02-22 VITALS — BP 129/77 | HR 70 | Temp 96.2°F | Ht 70.0 in | Wt 147.0 lb

## 2024-02-22 DIAGNOSIS — I5023 Acute on chronic systolic (congestive) heart failure: Secondary | ICD-10-CM | POA: Diagnosis not present

## 2024-02-22 DIAGNOSIS — I25810 Atherosclerosis of coronary artery bypass graft(s) without angina pectoris: Secondary | ICD-10-CM | POA: Diagnosis not present

## 2024-02-22 DIAGNOSIS — I5022 Chronic systolic (congestive) heart failure: Secondary | ICD-10-CM

## 2024-02-22 DIAGNOSIS — Z7901 Long term (current) use of anticoagulants: Secondary | ICD-10-CM | POA: Diagnosis not present

## 2024-02-22 DIAGNOSIS — I10 Essential (primary) hypertension: Secondary | ICD-10-CM

## 2024-02-22 DIAGNOSIS — I2699 Other pulmonary embolism without acute cor pulmonale: Secondary | ICD-10-CM

## 2024-02-22 LAB — CBC WITH DIFFERENTIAL/PLATELET
Basophils Absolute: 0.1 K/uL (ref 0.0–0.1)
Basophils Relative: 1.2 % (ref 0.0–3.0)
Eosinophils Absolute: 0 K/uL (ref 0.0–0.7)
Eosinophils Relative: 0.8 % (ref 0.0–5.0)
HCT: 45.4 % (ref 39.0–52.0)
Hemoglobin: 15.3 g/dL (ref 13.0–17.0)
Lymphocytes Relative: 18.1 % (ref 12.0–46.0)
Lymphs Abs: 0.9 K/uL (ref 0.7–4.0)
MCHC: 33.7 g/dL (ref 30.0–36.0)
MCV: 97.4 fl (ref 78.0–100.0)
Monocytes Absolute: 0.4 K/uL (ref 0.1–1.0)
Monocytes Relative: 7.6 % (ref 3.0–12.0)
Neutro Abs: 3.6 K/uL (ref 1.4–7.7)
Neutrophils Relative %: 72.3 % (ref 43.0–77.0)
Platelets: 134 K/uL — ABNORMAL LOW (ref 150.0–400.0)
RBC: 4.66 Mil/uL (ref 4.22–5.81)
RDW: 16.6 % — ABNORMAL HIGH (ref 11.5–15.5)
WBC: 5 K/uL (ref 4.0–10.5)

## 2024-02-22 LAB — BASIC METABOLIC PANEL WITH GFR
BUN: 18 mg/dL (ref 6–23)
CO2: 32 meq/L (ref 19–32)
Calcium: 9.8 mg/dL (ref 8.4–10.5)
Chloride: 100 meq/L (ref 96–112)
Creatinine, Ser: 0.98 mg/dL (ref 0.40–1.50)
GFR: 73.7 mL/min
Glucose, Bld: 85 mg/dL (ref 70–99)
Potassium: 4.1 meq/L (ref 3.5–5.1)
Sodium: 140 meq/L (ref 135–145)

## 2024-02-22 NOTE — Progress Notes (Signed)
 " 02/22/2024  CC:  Chief Complaint  Patient presents with   Hospitalization Follow-up    Patient is a 79 y.o. Caucasian male who presents for  hospital follow up, specifically Transitional Care Services face-to-face visit. Dates hospitalized: 1/8 to 02/10/2024. Days since d/c from hospital:  12 days Patient was discharged from hospital to home Reason for admission to hospital: Shortness of breath--> acute on chronic systolic heart failure and PE. Date of interactive (phone) contact with patient and/or caregiver: 02/13/24  I have reviewed patient's discharge summary plus pertinent specific notes, labs, and imaging from the hospitalization.    Currently: Daniel [name] is feeling very good. Weight at home is stable at 140 pounds. Legs swell a little bit but says they are better every morning.  He takes 40 mg of Lasix  daily. He denies any shortness of breath, dyspnea on exertion, chest pain, dizziness, fever, or cough.  He does have some skin bruising that is more than his usual but denies any blood in stool or urine.  No nosebleeds.  Discharge medication list: Albuterol  every 6 hours as needed, alprazolam  0.25 mg 1-2 nightly as needed, Augmentin  1 tab every 12 hours x 4 days, Eliquis  10 mg twice daily for 7 days and then 5 mg twice daily, aspirin  81 mg a day, benazepril  20 mg twice daily, Farxiga  10 mg daily, Lasix  40 mg daily, Claritin  10 mg a day, nicotine  patch 21 mg per 24-hour, 1 patch daily, omeprazole 20 mg daily.  Medication reconciliation was done today and patient is taking meds as recommended by discharging hospitalist/specialist.    PMH:  Past Medical History:  Diagnosis Date   Abdominal aortic aneurysm 01/2021   4 cm, infrarenal.  Also enlarged left common iliac artery--vascular eval 03/2021->rpt aortic u/s 06/01/22 stable Aorta 4.2 cm.  Bilat common iliac aneurisms and left common femoral aneurism-->f/u 1 yr   Acute prostatitis 06/19/2013   Atrial fibrillation [facility])    Limited episode,  emergency room, June, 2013, spontaneous conversion to sinus rhythm, was evaluated By Dr. Micky 2013- no further visits.  Post-op (CABG) a-fib, converted on amio but pt self d/c'd this med due to DOE/side effect profile.   Bradycardia 06/20/2017   CAD (coronary artery disease) 05/2016   COPD (chronic obstructive pulmonary disease) (HCC) fall 2016   Bullous changes noted on lower lung images of CT abd done by urology   Double vision 01/2020   MG ab w/u NEG.  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 Unable to have MRI due to having shrapnel in forehead.   History of diverticulitis    Hyperlipidemia    statin intolerant. Pt declined advanced lipid clinic referral 08/2019, 03/2020, and 01/2021.   Hypertension    Ischemic cardiomyopathy 05/2016   EF 20-25% at the time of NSTEMI/CABG.  Repeat EF 07/2016 30-35%.  07/2017 EF 25-30%, pt intol of entresto , bidil, and BB--for ICD 03/2019.   Microscopic hematuria Fall 2016   CT nl except nonobstructing stones.  Cystoscopy normal 12/30/14 (Dr. 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  Urol (Dr. Beola) at [facility]; Dr. Beola referred him to Dr. Renda 03/2015: patient got bilat nerve sparing , robot assisted laparoscopic radical prostatectomy and pelvic lymphadenectomy.  Urol f/u, PSA surveillance showing very mild uptrend of PSA but not to the level of biochemical recurrence as of 05/2021 urology follow-up   Pulmonary emboli (HCC)     02/2024->starte on eliquis  in hosp   RLS (restless legs syndrome)    Statin intolerance    Tobacco dependence    down to 1-2 cigs per day as of 11/2015.  Restarted 08/2016.   Urinary retention 05/2015   occurred s/p foley removal    PSH:  Past Surgical History:  Procedure Laterality Date   aortic ultrasound  07/2012   2014 NO AAA.  01/2021-->4 cm AAA with R common iliac ectasia-->vasc surg ref   BIV ICD INSERTION CRT-D     01/30/24   BIV ICD INSERTION CRT-D N/A 01/30/2024   Procedure: BIV ICD INSERTION CRT-D;  Surgeon: Waddell Danelle ORN, MD;  Location: [facility] INVASIVE CV LAB;  Service: Cardiovascular;  Laterality: N/A;   COLONOSCOPY  approx 2011 per pt   Done by surgeon in [place] after a bout of diverticulitis; normal per pt report--was told to repeat in 10 yrs.   CORONARY ARTERY BYPASS GRAFT N/A 05/12/2016   Procedure: CORONARY ARTERY BYPASS GRAFTING (CABG) TIMES THREE USING LEFT INTERNAL MAMMARY ARTERY AND RIGHT SAPHENOUS LEG VEIN HARVESTED ENDOSCOPICALLY;  Surgeon: Elspeth JAYSON Millers, MD;  Location: [facility] OR;  Service: Open Heart Surgery;  Laterality: N/A;   ICD IMPLANT N/A 04/12/2019   Procedure: ICD IMPLANT;  Surgeon: Waddell Danelle ORN, MD;  Location: [facility] INVASIVE CV LAB;  Service: Cardiovascular;  Laterality: N/A;   INGUINAL HERNIA REPAIR  2003 and 2004   Both sides.   LAPAROSCOPY  2017   LEFT HEART CATH AND CORONARY ANGIOGRAPHY N/A 05/09/2016   Procedure: Left Heart Cath and Coronary Angiography;  Surgeon: Debby DELENA Sor, MD;  Location: [facility] INVASIVE CV LAB;  Service: Cardiovascular;  Laterality: N/A;   LITHOTRIPSY  2004   Dr. Verdene in [place].   LYMPHADENECTOMY Bilateral 04/30/2015   Procedure: PELVIC LYMPHADENECTOMY;  Surgeon: Gretel Renda, MD;  Location: [facility] ORS;  Service: Urology;  Laterality: Bilateral;   PROSTATE BIOPSY  02/05/2015   Prostate adenocarcinoma: pt considering treatment options as of 02/19/15   ROBOT ASSISTED LAPAROSCOPIC RADICAL PROSTATECTOMY N/A 04/30/2015    Procedure: XI ROBOTIC ASSISTED LAPAROSCOPIC RADICAL PROSTATECTOMY LEVEL 2;  Surgeon: Gretel Renda, MD;  Location: [facility] ORS;  Service: Urology;  Laterality: N/A;   TEE WITHOUT CARDIOVERSION N/A 05/12/2016   Procedure: TRANSESOPHAGEAL ECHOCARDIOGRAM (TEE);  Surgeon: Elspeth JAYSON Millers, MD;  Location: [facility] OR;  Service: Open Heart Surgery;  Laterality: N/A;   TRANSTHORACIC ECHOCARDIOGRAM  03/22/2006; 05/2016; 07/2016; 07/2017; 01/02/20   2008: EF 65%, normal valves, no wall motion abnormalties, LA size normal.  05/2016 in context of non-STEMI, EF 20-25%, mild AV and MV regurg.  08/05/16: EF 30-35%, akinesis of mid lateral myoc, grd II DD, mild aortic 07/2017: EF 25-30%, mult areas of akinesis, grd I DD. 01/2020 EF 25-30%, sev LV dsfxn, valves fine, no WMA.  10/2023 EF 20%, dec LV and RV fx, ele PA press, mod/sev MR    MEDS:  Outpatient Medications Prior to Visit  Medication Sig Dispense Refill   albuterol  (VENTOLIN  HFA) 108 (90 Base) MCG/ACT inhaler Inhale 1-2 puffs  into the lungs every 6 (six) hours as needed for wheezing or shortness of breath. 8 g 1   ALPRAZolam  (XANAX ) 0.25 MG tablet tAKE 1 to 2 tabLETS BY MOUTH AT BEDTIME AS NEEDED FOR insomnia/restless legs 60 tablet 1   apixaban  (ELIQUIS ) 5 MG TABS tablet Take 1 tablet (5 mg total) by mouth 2 (two) times daily. 180 tablet 1   benazepril  (LOTENSIN ) 20 MG tablet TAKE ONE TABLET BY MOUTH TWICE DAILY 180 tablet 0   dapagliflozin  propanediol (FARXIGA ) 10 MG TABS tablet Take 10 mg by mouth daily.     furosemide  (LASIX ) 40 MG tablet Take 1 tablet (40 mg total) by mouth daily. 60 tablet 0   loratadine  (CLARITIN ) 10 MG tablet Take 1 tablet (10 mg total) by mouth daily. 30 tablet 11   omeprazole (PRILOSEC) 20 MG capsule Take 20 mg by mouth daily as needed (for acid reflux).     nicotine  (NICODERM CQ  - DOSED IN MG/24 HOURS) 21 mg/24hr patch Place 1 patch (21 mg total) onto the skin daily. (Patient not taking: Reported on 02/22/2024) 28 patch 0   No  facility-administered medications prior to visit.    Physical Exam    02/22/2024   11:08 AM 02/20/2024    1:03 PM 02/14/2024    3:32 PM  Vitals with BMI  Height 5' 10    Weight 147 lbs 140 lbs   BMI 21.09 20.09   Systolic 129 139 843  Diastolic 77 78 83  Pulse 70 72   02 sat 96% RA today General: Alert and well-appearing. Affect is pleasant, thought and speech are lucid. Cardiovascular: Regular rhythm and rate without murmur.  Lungs are clear bilaterally, breathing nonlabored. Extremities: He has compression stockings and long Jahns on.  I can feel 2-3+ pitting edema bile laterally from the ankles to about midway up the tibia.   Pertinent labs/imaging Last CBC Lab Results  Component Value Date   WBC 4.6 02/10/2024   HGB 15.4 02/10/2024   HCT 44.9 02/10/2024   MCV 95.3 02/10/2024   MCH 32.7 02/10/2024   RDW 15.4 02/10/2024   PLT 127 (L) 02/10/2024   Last metabolic panel Lab Results  Component Value Date   GLUCOSE 78 02/10/2024   NA 134 (L) 02/10/2024   K 4.1 02/10/2024   CL 99 02/10/2024   CO2 24 02/10/2024   BUN 31 (H) 02/10/2024   CREATININE 0.97 02/10/2024   GFRNONAA >60 02/10/2024   CALCIUM  9.0 02/10/2024   PHOS 3.5 10/25/2023   PROT 5.3 (L) 02/09/2024   ALBUMIN  3.4 (L) 02/09/2024   LABGLOB 2.2 09/16/2016   AGRATIO 2.0 09/16/2016   BILITOT 0.9 02/09/2024   ALKPHOS 70 02/09/2024   AST 28 02/09/2024   ALT 16 02/09/2024   ANIONGAP 11 02/10/2024   Last lipids Lab Results  Component Value Date   CHOL 156 05/24/2023   HDL 38.40 (L) 05/24/2023   LDLCALC 106 (H) 05/24/2023   TRIG 61.0 05/24/2023   CHOLHDL 4 05/24/2023   Lab Results  Component Value Date   PSA 0.28 05/24/2023   PSA 0.27 11/16/2022   PSA 0.11 11/17/2021   ASSESSMENT/PLAN:  #1 acute on chronic systolic heart failure. EF 10/2023--->20%. Doing well now since hospitalization. Continue Lasix  40 mg a day, benazepril  20 mg twice daily, and Farxiga  10 mg a day. He had CRT-D about 1 year  ago. Monitor electrolytes and creatinine today.  Forward results to cardiology to make them aware.  2. acute pulmonary embolism--> segmental  pulmonary embolism in the left upper lobe 02/09/2024. He is doing well on Eliquis .  Signs/symptoms to call or return for were reviewed and pt expressed understanding.  Plan 6 months of treatment.  #3 CAD, history of CABG. Continue aspirin  81 mg a day.  History of statin intolerance. He has chosen to decline any further trial of cholesterol-lowering medication.  #4 hypertension, well-controlled on benazepril  20 mg twice daily. Monitor electrolytes and creatinine today.  Medical decision making of moderate complexity was utilized today.  FOLLOW UP: Keep appointment already set for 1 month from now  Signed:  Gerlene Hockey, MD           02/22/2024  "

## 2024-02-23 ENCOUNTER — Ambulatory Visit: Payer: Self-pay | Admitting: Family Medicine

## 2024-02-26 ENCOUNTER — Ambulatory Visit

## 2024-02-27 ENCOUNTER — Other Ambulatory Visit: Payer: Self-pay

## 2024-02-27 NOTE — Transitions of Care (Post Inpatient/ED Visit) (Signed)
 " Transition of Care week 3  Visit Note  02/27/2024  Name: Daniel [name]. MRN: [id number]          DOB: 03-Feb-1945  Situation: Patient enrolled in [facility] 30-day program. Visit completed with patient by telephone.   Background:   Initial Transition Care Management Follow-up Telephone Call Discharge Date and Diagnosis: 02/10/24, SOB (shortness of breath)   Past Medical History:  Diagnosis Date   Abdominal aortic aneurysm 01/2021   4 cm, infrarenal.  Also enlarged left common iliac artery--vascular eval 03/2021->rpt aortic u/s 06/01/22 stable Aorta 4.2 cm.  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)    Limited episode, emergency room, June, 2013, spontaneous conversion to sinus rhythm, was evaluated By Dr. Micky 2013- no further visits.  Post-op (CABG) a-fib, converted on amio but pt self d/c'd this med due to DOE/side effect profile.   Bradycardia 06/20/2017   CAD (coronary artery disease) 05/2016   COPD (chronic obstructive pulmonary disease) (HCC) fall 2016   Bullous changes noted on lower lung images of CT abd done by urology   Double vision 01/2020   MG ab w/u NEG.  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 Unable to have MRI due to having shrapnel in forehead.   History of diverticulitis    Hyperlipidemia    statin intolerant. Pt declined advanced lipid clinic referral 08/2019, 03/2020, and 01/2021.   Hypertension    Ischemic cardiomyopathy 05/2016   EF 20-25% at the time of NSTEMI/CABG.  Repeat EF 07/2016 30-35%.  07/2017 EF 25-30%, pt intol of entresto , bidil, and BB--for ICD 03/2019.    Microscopic hematuria Fall 2016   CT nl except nonobstructing stones.  Cystoscopy normal 12/30/14 (Dr. 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  Urol (Dr. Beola) at [facility]; Dr. Beola referred him to Dr. Renda 03/2015: patient got bilat nerve sparing , robot assisted laparoscopic radical prostatectomy and pelvic lymphadenectomy.  Urol f/u, PSA surveillance showing very mild uptrend of PSA but not to the level of biochemical recurrence as of 05/2021 urology follow-up   Pulmonary emboli (HCC)    02/2024->starte on eliquis  in hosp   RLS (restless legs syndrome)    Statin intolerance    Tobacco dependence    down to 1-2 cigs per day as of 11/2015.  Restarted 08/2016.   Urinary retention 05/2015   occurred s/p foley removal    Assessment: Patient reports that he is feeling good. Denies any chest pain or shortness of breath Patient Reported Symptoms: Cognitive Cognitive Status: Able to follow simple commands, Alert and oriented to person, place, and time, Normal speech and language skills      Neurological Neurological Review of Symptoms: No symptoms reported Neurological Management Strategies: Routine screening Neurological Self-Management Outcome: 5 (very good)  HEENT HEENT Symptoms Reported: No symptoms reported HEENT Management Strategies: Weight management HEENT  Self-Management Outcome: 5 (very good)    Cardiovascular Cardiovascular Symptoms Reported: Swelling in legs or feet Other Cardiovascular Symptoms: right legs swells during the day. Does patient have uncontrolled Hypertension?: No Weight: 139 lb (63 kg) Cardiovascular Self-Management Outcome: 5 (very good)  Respiratory Respiratory Symptoms Reported: No symptoms reported Respiratory Management Strategies:  Routine screening Respiratory Self-Management Outcome: 5 (very good)  Endocrine Is patient diabetic?: No Endocrine Self-Management Outcome: 5 (very good)  Gastrointestinal Gastrointestinal Symptoms Reported: No symptoms reported Gastrointestinal Self-Management Outcome: 5 (very good)    Genitourinary Genitourinary Symptoms Reported: No symptoms reported    Integumentary Additional Integumentary Details: reports skin is healed and steri strips came off. Skin Management Strategies: Routine screening Skin Self-Management Outcome: 5 (very good)  Musculoskeletal Musculoskelatal Symptoms Reviewed: No symptoms reported Musculoskeletal Management Strategies: Routine screening Musculoskeletal Self-Management Outcome: 5 (very good)      Psychosocial Psychosocial Symptoms Reported: Not assessed         Today's Vitals   02/27/24 1244  BP: (!) 140/82  Weight: 139 lb (63 kg)   Pain Scale: 0-10 Pain Score: 0-No pain  Medications Reviewed Today     Reviewed by Rumalda Alan PENNER, RN (Registered Nurse) on 02/27/24 at 1251  Med List Status: <None>   Medication Order Taking? Sig Documenting Provider Last Dose Status Informant  albuterol  (VENTOLIN  HFA) 108 (90 Base) MCG/ACT inhaler [id number] Yes Inhale 1-2 puffs into the lungs every 6 (six) hours as needed for wheezing or shortness of breath. Ricky Fines, MD  Active Self  ALPRAZolam  (XANAX ) 0.25 MG tablet [id number] Yes tAKE 1 to 2 tabLETS BY MOUTH AT BEDTIME AS NEEDED FOR insomnia/restless legs McGowen, Aleene DEL, MD  Active Self  apixaban  (ELIQUIS ) 5 MG TABS tablet [id number] Yes Take 1 tablet (5 mg total) by mouth 2 (two) times daily. McGowen, Philip H, MD  Active   benazepril  (LOTENSIN ) 20 MG tablet [id number] Yes TAKE ONE TABLET BY MOUTH TWICE DAILY McGowen, Aleene DEL, MD  Active Self    Discontinued 11/06/14 1929   dapagliflozin  propanediol (FARXIGA ) 10 MG TABS tablet [id number] Yes Take 10 mg by mouth daily. [provider]  Active Self   furosemide  (LASIX ) 40 MG tablet [id number] Yes Take 1 tablet (40 mg total) by mouth daily. Jillian Buttery, MD  Active   loratadine  (CLARITIN ) 10 MG tablet [id number] Yes Take 1 tablet (10 mg total) by mouth daily. Sherlynn Madden, MD  Active Self  nicotine  (NICODERM CQ  - DOSED IN MG/24 HOURS) 21 mg/24hr patch [id number]  Place 1 patch (21 mg total) onto the skin daily.  Patient not taking: Reported on 02/22/2024   Jillian Buttery, MD  Active   omeprazole (PRILOSEC) 20 MG capsule [id number] Yes Take 20 mg by mouth daily as needed (for acid reflux). [provider]  Active Self            Goals Addressed             This Visit's Progress    VBCI Transitions of Care (TOC) Care Plan       Problems:  Recent Hospitalization for treatment of CHF and Shortness of breath  02/20/2024  140 pounds, no shortness of breath.  Following low salt diet. 02/27/2024   Breathing well. No shortness of breath.  Weight today 139 pounds. Right leg swells during the day but goes down at night.  Knowledge Deficit Related to Shortness of breath, CHF  Goal:  Over the next 30 days, the patient will not experience hospital readmission  Interventions:  Heart Failure Interventions: Provided education on low sodium diet Reviewed role of diuretics in prevention of fluid overload and management of heart failure; Discussed the importance of keeping all appointments with provider Reviewed importance of daily weights and recording of weights. Reviewed heart failure zones and when to call MD. Reviewed risk of bleeding.- reports bruising Reports that he has his medications including eliquis .   Patient Self Care Activities:  Attend all scheduled provider appointments Call pharmacy for medication refills 3-7 days in advance of running out of medications Call provider office for new concerns or questions  Notify RN Care Manager of TOC call rescheduling needs Participate in Transition of Care Program/Attend  TOC scheduled calls Perform all self care activities independently  Perform IADL's (shopping, preparing meals, housekeeping, managing finances) independently Take medications as prescribed    Smokes 2 cigarettes per day.   Plan:  A telephone outreach has been scheduled for: 03/06/2024 with Alan Ee RN         Recommendation:   Continue Current Plan of Care  Follow Up Plan:   Telephone follow up appointment date/time:  03/06/2024 at noon  .ars    "

## 2024-02-27 NOTE — Patient Instructions (Signed)
 Visit Information  Thank you for taking time to visit with me today. Please don't hesitate to contact me if I can be of assistance to you before our next scheduled telephone appointment.  Following are the goals we discussed today:   Goals Addressed             This Visit's Progress    VBCI Transitions of Care (TOC) Care Plan       Problems:  Recent Hospitalization for treatment of CHF and Shortness of breath  02/20/2024  140 pounds, no shortness of breath.  Following low salt diet. 02/27/2024   Breathing well. No shortness of breath.  Weight today 139 pounds. Right leg swells during the day but goes down at night.  Knowledge Deficit Related to Shortness of breath, CHF  Goal:  Over the next 30 days, the patient will not experience hospital readmission  Interventions:  Heart Failure Interventions: Provided education on low sodium diet Reviewed role of diuretics in prevention of fluid overload and management of heart failure; Discussed the importance of keeping all appointments with provider Reviewed importance of daily weights and recording of weights. Reviewed heart failure zones and when to call MD. Reviewed risk of bleeding.- reports bruising Reports that he has his medications including eliquis .   Patient Self Care Activities:  Attend all scheduled provider appointments Call pharmacy for medication refills 3-7 days in advance of running out of medications Call provider office for new concerns or questions  Notify RN Care Manager of TOC call rescheduling needs Participate in Transition of Care Program/Attend TOC scheduled calls Perform all self care activities independently  Perform IADL's (shopping, preparing meals, housekeeping, managing finances) independently Take medications as prescribed    Smokes 2 cigarettes per day.   Plan:  A telephone outreach has been scheduled for: 03/06/2024 with Alan Ee RN         Our next appointment is by telephone on 03/06/2024 at  1200 noon  Please call the care guide team at [phone] if you need to cancel or reschedule your appointment.   If you are experiencing a Mental Health or Behavioral Health Crisis or need someone to talk to, please call the Suicide and Crisis Lifeline: 988 call the [place]  National Suicide Prevention Lifeline: 602 485 8312 or TTY: 5628046276 TTY (435) 060-1115) to talk to a trained counselor call 1-800-273-TALK (toll free, 24 hour hotline) call 911   Care plan and visit instructions communicated with the patient verbally today. Patient agrees to receive a copy in MyChart. Active MyChart status and patient understanding of how to access instructions and care plan via MyChart confirmed with patient.     Alan Ee, RN, BSN, CEN [employer]- Transition of Care Team.  [facility] [phone]

## 2024-03-06 ENCOUNTER — Other Ambulatory Visit

## 2024-03-06 NOTE — Transitions of Care (Post Inpatient/ED Visit) (Signed)
 " Transition of Care week 4  Visit Note  03/06/2024  Name: Daniel [name]. MRN: [id number]          DOB: Nov 10, 1945  Situation: Patient enrolled in [facility] 30-day program. Visit completed with Patient by telephone. Patient was having phone or hearing issues on this call today and it was cut a bit short.   Background: 02/10/24, SOB (shortness of breath)  Initial Transition Care Management Follow-up Telephone Call Discharge Date and Diagnosis: 02/10/24, SOB (shortness of breath)   Past Medical History:  Diagnosis Date   Abdominal aortic aneurysm 01/2021   4 cm, infrarenal.  Also enlarged left common iliac artery--vascular eval 03/2021->rpt aortic u/s 06/01/22 stable Aorta 4.2 cm.  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 Micky 2013- no further visits.  Post-op (CABG) a-fib, converted on amio but pt self d/c'd this med due to DOE/side effect profile.   Bradycardia 06/20/2017   CAD (coronary artery disease) 05/2016   COPD (chronic obstructive pulmonary disease) (HCC) fall 2016   Bullous changes noted on lower lung images of CT abd done by urology   Double vision 01/2020   MG ab w/u NEG.  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 Unable to have MRI due to having shrapnel in forehead.   History of diverticulitis    Hyperlipidemia    statin intolerant. Pt declined advanced lipid clinic referral 08/2019, 03/2020, and 01/2021.   Hypertension    Ischemic cardiomyopathy 05/2016   EF 20-25% at the  time of NSTEMI/CABG.  Repeat EF 07/2016 30-35%.  07/2017 EF 25-30%, pt intol of entresto , bidil, and BB--for ICD 03/2019.   Microscopic hematuria Fall 2016   CT nl except nonobstructing stones.  Cystoscopy normal 12/30/14 (Dr. 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  Urol (Dr. Beola) at [facility]; Dr. Beola referred him to Dr. Renda 03/2015: patient got bilat nerve sparing , robot assisted laparoscopic radical prostatectomy and pelvic lymphadenectomy.  Urol f/u, PSA surveillance showing very mild uptrend of PSA but not to the level of biochemical recurrence as of 05/2021 urology follow-up   Pulmonary emboli (HCC)    02/2024->starte on eliquis  in hosp   RLS (restless legs syndrome)    Statin intolerance    Tobacco dependence    down to 1-2 cigs per day as of 11/2015.  Restarted 08/2016.   Urinary retention 05/2015   occurred s/p foley removal    Assessment: Patient Reported Symptoms: Cognitive Cognitive Status: Alert and oriented to person, place, and time, Insightful and able to interpret abstract concepts, Able to follow simple commands      Neurological Neurological Review of Symptoms: No symptoms reported Neurological Self-Management Outcome: 5 (very good)  HEENT HEENT Symptoms Reported: No symptoms reported (Seemed to  have issue hearing but unsure if it was call connection or patient issue.) HEENT Management Strategies: Activity HEENT Self-Management Outcome: 5 (very good)    Cardiovascular Cardiovascular Symptoms Reported: Swelling in legs or feet Other Cardiovascular Symptoms: Swelling not as bad per patient reported. Does patient have uncontrolled Hypertension?: No Cardiovascular Management Strategies: Coping strategies, Medical device, Medication therapy,  Routine screening Weight: 140 lb (63.5 kg) Cardiovascular Self-Management Outcome: 5 (very good)  Respiratory Respiratory Symptoms Reported: No symptoms reported Respiratory Management Strategies: Routine screening, Medication therapy Respiratory Self-Management Outcome: 5 (very good)  Endocrine Endocrine Symptoms Reported: No symptoms reported Is patient diabetic?: No    Gastrointestinal Gastrointestinal Symptoms Reported: No symptoms reported Additional Gastrointestinal Details: No issues reported. Gastrointestinal Self-Management Outcome: 5 (very good)    Genitourinary Genitourinary Symptoms Reported: No symptoms reported    Integumentary Additional Integumentary Details: Skin issues healing or healed. Skin Management Strategies: Routine screening Skin Self-Management Outcome: 5 (very good)  Musculoskeletal Musculoskelatal Symptoms Reviewed: No symptoms reported Additional Musculoskeletal Details: Reports restless legs at night and tired at the end of day. Musculoskeletal Management Strategies: Routine screening Musculoskeletal Self-Management Outcome: 5 (very good) Falls in the past year?: Yes Number of falls in past year: 1 or less Was there an injury with Fall?: No Fall Risk Category Calculator: 1 Patient Fall Risk Level: Low Fall Risk Patient at Risk for Falls Due to: History of fall(s) Fall risk Follow up: Falls prevention discussed  Psychosocial Psychosocial Symptoms Reported: No symptoms reported         Today's Vitals   03/06/24 1254  BP: (!) 148/90  Weight: 140 lb (63.5 kg)   Pain Scale: 0-10 Pain Score: 0-No pain Multiple Pain Sites: No  Medications Reviewed Today     Reviewed by Carolee Heron NOVAK, RN (Case Manager) on 03/06/24 at 1251  Med List Status: <None>   Medication Order Taking? Sig Documenting Provider Last Dose Status Informant  albuterol  (VENTOLIN  HFA) 108 (90 Base) MCG/ACT inhaler [id number] Yes Inhale 1-2 puffs into the lungs every 6 (six) hours  as needed for wheezing or shortness of breath. Ricky Fines, MD  Active Self  ALPRAZolam  (XANAX ) 0.25 MG tablet [id number] Yes tAKE 1 to 2 tabLETS BY MOUTH AT BEDTIME AS NEEDED FOR insomnia/restless legs McGowen, Aleene DEL, MD  Active Self  apixaban  (ELIQUIS ) 5 MG TABS tablet [id number] Yes Take 1 tablet (5 mg total) by mouth 2 (two) times daily. McGowen, Philip H, MD  Active   benazepril  (LOTENSIN ) 20 MG tablet [id number] Yes TAKE ONE TABLET BY MOUTH TWICE DAILY McGowen, Aleene DEL, MD  Active Self    Discontinued 11/06/14 1929   dapagliflozin  propanediol (FARXIGA ) 10 MG TABS tablet [id number] Yes Take 10 mg by mouth daily. [provider]  Active Self  furosemide  (LASIX ) 40 MG tablet [id number] Yes Take 1 tablet (40 mg total) by mouth daily. Jillian Buttery, MD  Active   loratadine  (CLARITIN ) 10 MG tablet [id number] Yes Take 1 tablet (10 mg total) by mouth daily.  Patient taking differently: Take 10 mg by mouth daily as needed.   Sherlynn Madden, MD  Active Self  nicotine  (NICODERM CQ  - DOSED IN MG/24 HOURS) 21 mg/24hr patch [id number]  Place 1 patch (21 mg total) onto the skin daily.  Patient not taking: Reported on 03/06/2024   Jillian Buttery, MD  Active   omeprazole (PRILOSEC) 20 MG capsule [id number] Yes Take 20 mg by mouth daily as needed (for acid reflux). [provider]  Active Self  Goals Addressed             This Visit's Progress    VBCI Transitions of Care (TOC) Care Plan   On track    Care plan/goals reviewed with patient on this call today 03/06/2024.  Problems:  Recent Hospitalization for treatment of CHF and Shortness of breath   03/06/2024 140- 141 pounds reported on call today.  Following low salt diet: Continues Breathing well. No shortness of breath reported, states he is doing well.    Right leg swells during the day but goes down at night. Ongoing to some degree.  Knowledge Deficit Related to Shortness of breath, CHF  Goal:  Over the  next 30 days, the patient will not experience hospital readmission  Interventions:   Heart Failure Interventions: Provided education on low sodium diet Provided education about placing scale on hard, flat surface Advised patient to weigh each morning after emptying bladder Reviewed role of diuretics in prevention of fluid overload and management of heart failure; Discussed the importance of keeping all appointments with provider Screening for signs and symptoms of depression related to chronic disease state  Assessed social determinant of health barriers: No changes reported from prior assessment.  Reviewed importance of daily weights and recording of weights and blood pressures Reviewed heart failure zones and when to call MD. Reviewed risk of bleeding. Reports bruising is healing.  Revisited bleeding/fall precautions and to report any falls to provider Reports that he has his medications including Eliquis  and received a refill rx from PCP provider on 02/22/2024 HFU Completed.   Patient Self Care Activities:  Attend all scheduled provider appointments Call pharmacy for medication refills 3-7 days in advance of running out of medications Call provider office for new concerns or questions  Notify RN Care Manager of TOC call rescheduling needs Participate in Transition of Care Program/Attend TOC scheduled calls Perform all self care activities independently  Perform IADL's (shopping, preparing meals, housekeeping, managing finances) independently Take medications as prescribed    Smokes 2 cigarettes per day. Consider smoking cessation.  Report any falls due to bleeding precautions reviewed.   Plan:  A telephone outreach has been scheduled for: 03/13/2024 with Alan Ee RN         Recommendation:   Continue Current Plan of Care  Follow Up Plan:   Telephone follow-up in 1 week 03/13/2024 at 12 noon.    Bing Edison MSN, RN RN [employer]  [facility] Office Hours M-F [phone] Direct Dial: [phone] Main Phone [phone]  Fax: [phone] [email or web address]       "

## 2024-03-13 ENCOUNTER — Telehealth

## 2024-03-13 ENCOUNTER — Ambulatory Visit

## 2024-03-22 ENCOUNTER — Ambulatory Visit: Admitting: Family Medicine

## 2024-04-05 ENCOUNTER — Ambulatory Visit

## 2024-04-12 ENCOUNTER — Ambulatory Visit: Admitting: Student in an Organized Health Care Education/Training Program

## 2024-07-05 ENCOUNTER — Ambulatory Visit

## 2024-10-04 ENCOUNTER — Ambulatory Visit

## 2025-01-03 ENCOUNTER — Ambulatory Visit

## 2025-04-04 ENCOUNTER — Ambulatory Visit

## 2025-07-04 ENCOUNTER — Ambulatory Visit
